# Patient Record
Sex: Male | Born: 1935 | Race: White | Hispanic: No | Marital: Married | Smoking: Never smoker
Health system: Southern US, Community
[De-identification: ages and names within clinical notes are randomized; demographics above are authoritative.]

## PROBLEM LIST (undated history)

## (undated) DIAGNOSIS — E538 Deficiency of other specified B group vitamins: Secondary | ICD-10-CM

## (undated) DIAGNOSIS — N189 Chronic kidney disease, unspecified: Secondary | ICD-10-CM

## (undated) DIAGNOSIS — C439 Malignant melanoma of skin, unspecified: Secondary | ICD-10-CM

## (undated) DIAGNOSIS — I4891 Unspecified atrial fibrillation: Secondary | ICD-10-CM

## (undated) DIAGNOSIS — I509 Heart failure, unspecified: Secondary | ICD-10-CM

## (undated) DIAGNOSIS — K859 Acute pancreatitis without necrosis or infection, unspecified: Secondary | ICD-10-CM

## (undated) HISTORY — DX: Malignant melanoma of skin, unspecified: C43.9

## (undated) HISTORY — DX: Chronic kidney disease, unspecified: N18.9

## (undated) HISTORY — DX: Deficiency of other specified B group vitamins: E53.8

---

## 1999-04-23 ENCOUNTER — Ambulatory Visit (HOSPITAL_COMMUNITY): Admission: RE | Admit: 1999-04-23 | Discharge: 1999-04-23 | Payer: Self-pay | Admitting: *Deleted

## 2000-07-30 ENCOUNTER — Ambulatory Visit (HOSPITAL_BASED_OUTPATIENT_CLINIC_OR_DEPARTMENT_OTHER): Admission: RE | Admit: 2000-07-30 | Discharge: 2000-07-30 | Payer: Self-pay | Admitting: Plastic Surgery

## 2006-02-10 ENCOUNTER — Encounter: Admission: RE | Admit: 2006-02-10 | Discharge: 2006-02-10 | Payer: Self-pay | Admitting: Geriatric Medicine

## 2011-10-25 DIAGNOSIS — C44529 Squamous cell carcinoma of skin of other part of trunk: Secondary | ICD-10-CM | POA: Diagnosis not present

## 2011-10-25 DIAGNOSIS — Z85828 Personal history of other malignant neoplasm of skin: Secondary | ICD-10-CM | POA: Diagnosis not present

## 2011-10-25 DIAGNOSIS — L821 Other seborrheic keratosis: Secondary | ICD-10-CM | POA: Diagnosis not present

## 2011-10-25 DIAGNOSIS — Z85820 Personal history of malignant melanoma of skin: Secondary | ICD-10-CM | POA: Diagnosis not present

## 2011-10-25 DIAGNOSIS — D0460 Carcinoma in situ of skin of unspecified upper limb, including shoulder: Secondary | ICD-10-CM | POA: Diagnosis not present

## 2011-10-25 DIAGNOSIS — L570 Actinic keratosis: Secondary | ICD-10-CM | POA: Diagnosis not present

## 2011-12-31 DIAGNOSIS — D3130 Benign neoplasm of unspecified choroid: Secondary | ICD-10-CM | POA: Diagnosis not present

## 2011-12-31 DIAGNOSIS — H40019 Open angle with borderline findings, low risk, unspecified eye: Secondary | ICD-10-CM | POA: Diagnosis not present

## 2011-12-31 DIAGNOSIS — H04129 Dry eye syndrome of unspecified lacrimal gland: Secondary | ICD-10-CM | POA: Diagnosis not present

## 2011-12-31 DIAGNOSIS — H2510 Age-related nuclear cataract, unspecified eye: Secondary | ICD-10-CM | POA: Diagnosis not present

## 2012-01-24 DIAGNOSIS — C44221 Squamous cell carcinoma of skin of unspecified ear and external auricular canal: Secondary | ICD-10-CM | POA: Diagnosis not present

## 2012-01-24 DIAGNOSIS — Z85828 Personal history of other malignant neoplasm of skin: Secondary | ICD-10-CM | POA: Diagnosis not present

## 2012-01-24 DIAGNOSIS — L570 Actinic keratosis: Secondary | ICD-10-CM | POA: Diagnosis not present

## 2012-01-24 DIAGNOSIS — Z85820 Personal history of malignant melanoma of skin: Secondary | ICD-10-CM | POA: Diagnosis not present

## 2012-01-24 DIAGNOSIS — C44519 Basal cell carcinoma of skin of other part of trunk: Secondary | ICD-10-CM | POA: Diagnosis not present

## 2012-01-24 DIAGNOSIS — C4441 Basal cell carcinoma of skin of scalp and neck: Secondary | ICD-10-CM | POA: Diagnosis not present

## 2012-01-24 DIAGNOSIS — L821 Other seborrheic keratosis: Secondary | ICD-10-CM | POA: Diagnosis not present

## 2012-06-09 DIAGNOSIS — C439 Malignant melanoma of skin, unspecified: Secondary | ICD-10-CM | POA: Diagnosis not present

## 2012-06-09 DIAGNOSIS — Z09 Encounter for follow-up examination after completed treatment for conditions other than malignant neoplasm: Secondary | ICD-10-CM | POA: Diagnosis not present

## 2012-06-09 DIAGNOSIS — Z006 Encounter for examination for normal comparison and control in clinical research program: Secondary | ICD-10-CM | POA: Diagnosis not present

## 2012-07-27 DIAGNOSIS — Z85828 Personal history of other malignant neoplasm of skin: Secondary | ICD-10-CM | POA: Diagnosis not present

## 2012-07-27 DIAGNOSIS — D485 Neoplasm of uncertain behavior of skin: Secondary | ICD-10-CM | POA: Diagnosis not present

## 2012-07-27 DIAGNOSIS — C44319 Basal cell carcinoma of skin of other parts of face: Secondary | ICD-10-CM | POA: Diagnosis not present

## 2012-07-27 DIAGNOSIS — C44711 Basal cell carcinoma of skin of unspecified lower limb, including hip: Secondary | ICD-10-CM | POA: Diagnosis not present

## 2012-07-27 DIAGNOSIS — L570 Actinic keratosis: Secondary | ICD-10-CM | POA: Diagnosis not present

## 2012-07-27 DIAGNOSIS — Z85820 Personal history of malignant melanoma of skin: Secondary | ICD-10-CM | POA: Diagnosis not present

## 2012-07-27 DIAGNOSIS — L821 Other seborrheic keratosis: Secondary | ICD-10-CM | POA: Diagnosis not present

## 2012-07-27 DIAGNOSIS — C44519 Basal cell carcinoma of skin of other part of trunk: Secondary | ICD-10-CM | POA: Diagnosis not present

## 2012-08-18 DIAGNOSIS — C44319 Basal cell carcinoma of skin of other parts of face: Secondary | ICD-10-CM | POA: Diagnosis not present

## 2012-10-26 DIAGNOSIS — Z85820 Personal history of malignant melanoma of skin: Secondary | ICD-10-CM | POA: Diagnosis not present

## 2012-10-26 DIAGNOSIS — L819 Disorder of pigmentation, unspecified: Secondary | ICD-10-CM | POA: Diagnosis not present

## 2012-10-26 DIAGNOSIS — L821 Other seborrheic keratosis: Secondary | ICD-10-CM | POA: Diagnosis not present

## 2012-10-26 DIAGNOSIS — L570 Actinic keratosis: Secondary | ICD-10-CM | POA: Diagnosis not present

## 2012-10-26 DIAGNOSIS — D1801 Hemangioma of skin and subcutaneous tissue: Secondary | ICD-10-CM | POA: Diagnosis not present

## 2012-10-26 DIAGNOSIS — D045 Carcinoma in situ of skin of trunk: Secondary | ICD-10-CM | POA: Diagnosis not present

## 2012-10-26 DIAGNOSIS — D239 Other benign neoplasm of skin, unspecified: Secondary | ICD-10-CM | POA: Diagnosis not present

## 2012-10-26 DIAGNOSIS — D485 Neoplasm of uncertain behavior of skin: Secondary | ICD-10-CM | POA: Diagnosis not present

## 2012-10-26 DIAGNOSIS — Z85828 Personal history of other malignant neoplasm of skin: Secondary | ICD-10-CM | POA: Diagnosis not present

## 2012-10-26 DIAGNOSIS — L738 Other specified follicular disorders: Secondary | ICD-10-CM | POA: Diagnosis not present

## 2012-12-15 DIAGNOSIS — C439 Malignant melanoma of skin, unspecified: Secondary | ICD-10-CM | POA: Diagnosis not present

## 2012-12-15 DIAGNOSIS — J984 Other disorders of lung: Secondary | ICD-10-CM | POA: Diagnosis not present

## 2012-12-15 DIAGNOSIS — M19019 Primary osteoarthritis, unspecified shoulder: Secondary | ICD-10-CM | POA: Diagnosis not present

## 2012-12-15 DIAGNOSIS — E278 Other specified disorders of adrenal gland: Secondary | ICD-10-CM | POA: Diagnosis not present

## 2012-12-15 DIAGNOSIS — Z006 Encounter for examination for normal comparison and control in clinical research program: Secondary | ICD-10-CM | POA: Diagnosis not present

## 2012-12-15 DIAGNOSIS — K429 Umbilical hernia without obstruction or gangrene: Secondary | ICD-10-CM | POA: Diagnosis not present

## 2012-12-15 DIAGNOSIS — Q619 Cystic kidney disease, unspecified: Secondary | ICD-10-CM | POA: Diagnosis not present

## 2012-12-15 DIAGNOSIS — E079 Disorder of thyroid, unspecified: Secondary | ICD-10-CM | POA: Diagnosis not present

## 2012-12-15 DIAGNOSIS — M479 Spondylosis, unspecified: Secondary | ICD-10-CM | POA: Diagnosis not present

## 2012-12-15 DIAGNOSIS — K5730 Diverticulosis of large intestine without perforation or abscess without bleeding: Secondary | ICD-10-CM | POA: Diagnosis not present

## 2012-12-15 DIAGNOSIS — N400 Enlarged prostate without lower urinary tract symptoms: Secondary | ICD-10-CM | POA: Diagnosis not present

## 2012-12-15 DIAGNOSIS — M799 Soft tissue disorder, unspecified: Secondary | ICD-10-CM | POA: Diagnosis not present

## 2012-12-15 DIAGNOSIS — K4020 Bilateral inguinal hernia, without obstruction or gangrene, not specified as recurrent: Secondary | ICD-10-CM | POA: Diagnosis not present

## 2012-12-15 DIAGNOSIS — Z0389 Encounter for observation for other suspected diseases and conditions ruled out: Secondary | ICD-10-CM | POA: Diagnosis not present

## 2013-01-05 DIAGNOSIS — H2510 Age-related nuclear cataract, unspecified eye: Secondary | ICD-10-CM | POA: Diagnosis not present

## 2013-01-05 DIAGNOSIS — D3130 Benign neoplasm of unspecified choroid: Secondary | ICD-10-CM | POA: Diagnosis not present

## 2013-01-05 DIAGNOSIS — H40019 Open angle with borderline findings, low risk, unspecified eye: Secondary | ICD-10-CM | POA: Diagnosis not present

## 2013-01-05 DIAGNOSIS — H0230 Blepharochalasis unspecified eye, unspecified eyelid: Secondary | ICD-10-CM | POA: Diagnosis not present

## 2013-01-22 DIAGNOSIS — Z85828 Personal history of other malignant neoplasm of skin: Secondary | ICD-10-CM | POA: Diagnosis not present

## 2013-01-22 DIAGNOSIS — L719 Rosacea, unspecified: Secondary | ICD-10-CM | POA: Diagnosis not present

## 2013-01-22 DIAGNOSIS — L570 Actinic keratosis: Secondary | ICD-10-CM | POA: Diagnosis not present

## 2013-01-22 DIAGNOSIS — L820 Inflamed seborrheic keratosis: Secondary | ICD-10-CM | POA: Diagnosis not present

## 2013-01-22 DIAGNOSIS — Z85820 Personal history of malignant melanoma of skin: Secondary | ICD-10-CM | POA: Diagnosis not present

## 2013-01-22 DIAGNOSIS — L819 Disorder of pigmentation, unspecified: Secondary | ICD-10-CM | POA: Diagnosis not present

## 2013-01-22 DIAGNOSIS — D1801 Hemangioma of skin and subcutaneous tissue: Secondary | ICD-10-CM | POA: Diagnosis not present

## 2013-01-22 DIAGNOSIS — L821 Other seborrheic keratosis: Secondary | ICD-10-CM | POA: Diagnosis not present

## 2013-04-27 DIAGNOSIS — L821 Other seborrheic keratosis: Secondary | ICD-10-CM | POA: Diagnosis not present

## 2013-04-27 DIAGNOSIS — L570 Actinic keratosis: Secondary | ICD-10-CM | POA: Diagnosis not present

## 2013-04-27 DIAGNOSIS — Z85820 Personal history of malignant melanoma of skin: Secondary | ICD-10-CM | POA: Diagnosis not present

## 2013-04-27 DIAGNOSIS — C44611 Basal cell carcinoma of skin of unspecified upper limb, including shoulder: Secondary | ICD-10-CM | POA: Diagnosis not present

## 2013-04-27 DIAGNOSIS — D485 Neoplasm of uncertain behavior of skin: Secondary | ICD-10-CM | POA: Diagnosis not present

## 2013-04-27 DIAGNOSIS — Z85828 Personal history of other malignant neoplasm of skin: Secondary | ICD-10-CM | POA: Diagnosis not present

## 2013-06-07 ENCOUNTER — Other Ambulatory Visit: Payer: Self-pay | Admitting: Geriatric Medicine

## 2013-06-07 DIAGNOSIS — R1310 Dysphagia, unspecified: Secondary | ICD-10-CM | POA: Diagnosis not present

## 2013-06-10 ENCOUNTER — Ambulatory Visit
Admission: RE | Admit: 2013-06-10 | Discharge: 2013-06-10 | Disposition: A | Discharge status: Home / Self Care | Payer: Medicare Other | Source: Ambulatory Visit | Attending: Geriatric Medicine | Admitting: Geriatric Medicine

## 2013-06-10 DIAGNOSIS — K449 Diaphragmatic hernia without obstruction or gangrene: Secondary | ICD-10-CM | POA: Diagnosis not present

## 2013-06-10 DIAGNOSIS — R1310 Dysphagia, unspecified: Secondary | ICD-10-CM

## 2013-06-10 DIAGNOSIS — K228 Other specified diseases of esophagus: Secondary | ICD-10-CM | POA: Diagnosis not present

## 2013-07-19 DIAGNOSIS — L0291 Cutaneous abscess, unspecified: Secondary | ICD-10-CM | POA: Diagnosis not present

## 2013-07-19 DIAGNOSIS — Z85828 Personal history of other malignant neoplasm of skin: Secondary | ICD-10-CM | POA: Diagnosis not present

## 2013-07-19 DIAGNOSIS — Z85820 Personal history of malignant melanoma of skin: Secondary | ICD-10-CM | POA: Diagnosis not present

## 2013-07-19 DIAGNOSIS — L723 Sebaceous cyst: Secondary | ICD-10-CM | POA: Diagnosis not present

## 2013-07-27 DIAGNOSIS — Z85820 Personal history of malignant melanoma of skin: Secondary | ICD-10-CM | POA: Diagnosis not present

## 2013-07-27 DIAGNOSIS — L821 Other seborrheic keratosis: Secondary | ICD-10-CM | POA: Diagnosis not present

## 2013-07-27 DIAGNOSIS — Z85828 Personal history of other malignant neoplasm of skin: Secondary | ICD-10-CM | POA: Diagnosis not present

## 2013-07-27 DIAGNOSIS — L570 Actinic keratosis: Secondary | ICD-10-CM | POA: Diagnosis not present

## 2013-07-27 DIAGNOSIS — D1801 Hemangioma of skin and subcutaneous tissue: Secondary | ICD-10-CM | POA: Diagnosis not present

## 2013-07-27 DIAGNOSIS — C44519 Basal cell carcinoma of skin of other part of trunk: Secondary | ICD-10-CM | POA: Diagnosis not present

## 2013-07-27 DIAGNOSIS — L723 Sebaceous cyst: Secondary | ICD-10-CM | POA: Diagnosis not present

## 2013-07-27 DIAGNOSIS — L819 Disorder of pigmentation, unspecified: Secondary | ICD-10-CM | POA: Diagnosis not present

## 2013-07-27 DIAGNOSIS — D485 Neoplasm of uncertain behavior of skin: Secondary | ICD-10-CM | POA: Diagnosis not present

## 2013-12-21 DIAGNOSIS — Z0389 Encounter for observation for other suspected diseases and conditions ruled out: Secondary | ICD-10-CM | POA: Diagnosis not present

## 2013-12-21 DIAGNOSIS — C439 Malignant melanoma of skin, unspecified: Secondary | ICD-10-CM | POA: Diagnosis not present

## 2013-12-29 DIAGNOSIS — C439 Malignant melanoma of skin, unspecified: Secondary | ICD-10-CM | POA: Diagnosis not present

## 2013-12-29 DIAGNOSIS — R599 Enlarged lymph nodes, unspecified: Secondary | ICD-10-CM | POA: Diagnosis not present

## 2013-12-29 DIAGNOSIS — R933 Abnormal findings on diagnostic imaging of other parts of digestive tract: Secondary | ICD-10-CM | POA: Diagnosis not present

## 2013-12-31 DIAGNOSIS — Z85828 Personal history of other malignant neoplasm of skin: Secondary | ICD-10-CM | POA: Diagnosis not present

## 2013-12-31 DIAGNOSIS — L570 Actinic keratosis: Secondary | ICD-10-CM | POA: Diagnosis not present

## 2013-12-31 DIAGNOSIS — D1801 Hemangioma of skin and subcutaneous tissue: Secondary | ICD-10-CM | POA: Diagnosis not present

## 2013-12-31 DIAGNOSIS — Z85820 Personal history of malignant melanoma of skin: Secondary | ICD-10-CM | POA: Diagnosis not present

## 2013-12-31 DIAGNOSIS — L821 Other seborrheic keratosis: Secondary | ICD-10-CM | POA: Diagnosis not present

## 2014-01-07 DIAGNOSIS — H04129 Dry eye syndrome of unspecified lacrimal gland: Secondary | ICD-10-CM | POA: Diagnosis not present

## 2014-01-07 DIAGNOSIS — H40019 Open angle with borderline findings, low risk, unspecified eye: Secondary | ICD-10-CM | POA: Diagnosis not present

## 2014-01-07 DIAGNOSIS — H02839 Dermatochalasis of unspecified eye, unspecified eyelid: Secondary | ICD-10-CM | POA: Diagnosis not present

## 2014-01-07 DIAGNOSIS — H2510 Age-related nuclear cataract, unspecified eye: Secondary | ICD-10-CM | POA: Diagnosis not present

## 2014-01-07 DIAGNOSIS — D3130 Benign neoplasm of unspecified choroid: Secondary | ICD-10-CM | POA: Diagnosis not present

## 2014-01-07 DIAGNOSIS — H1045 Other chronic allergic conjunctivitis: Secondary | ICD-10-CM | POA: Diagnosis not present

## 2014-01-10 DIAGNOSIS — C439 Malignant melanoma of skin, unspecified: Secondary | ICD-10-CM | POA: Diagnosis not present

## 2014-01-10 DIAGNOSIS — C786 Secondary malignant neoplasm of retroperitoneum and peritoneum: Secondary | ICD-10-CM | POA: Diagnosis not present

## 2014-01-10 DIAGNOSIS — C772 Secondary and unspecified malignant neoplasm of intra-abdominal lymph nodes: Secondary | ICD-10-CM | POA: Diagnosis not present

## 2014-01-10 DIAGNOSIS — C779 Secondary and unspecified malignant neoplasm of lymph node, unspecified: Secondary | ICD-10-CM | POA: Diagnosis not present

## 2014-01-10 DIAGNOSIS — Z0389 Encounter for observation for other suspected diseases and conditions ruled out: Secondary | ICD-10-CM | POA: Diagnosis not present

## 2014-01-10 DIAGNOSIS — C792 Secondary malignant neoplasm of skin: Secondary | ICD-10-CM | POA: Diagnosis not present

## 2014-01-10 DIAGNOSIS — C50919 Malignant neoplasm of unspecified site of unspecified female breast: Secondary | ICD-10-CM | POA: Diagnosis not present

## 2014-01-10 DIAGNOSIS — C801 Malignant (primary) neoplasm, unspecified: Secondary | ICD-10-CM | POA: Diagnosis not present

## 2014-01-12 DIAGNOSIS — C439 Malignant melanoma of skin, unspecified: Secondary | ICD-10-CM | POA: Diagnosis not present

## 2014-01-12 DIAGNOSIS — C4330 Malignant melanoma of unspecified part of face: Secondary | ICD-10-CM | POA: Diagnosis not present

## 2014-01-19 DIAGNOSIS — Z79899 Other long term (current) drug therapy: Secondary | ICD-10-CM | POA: Diagnosis not present

## 2014-01-19 DIAGNOSIS — C439 Malignant melanoma of skin, unspecified: Secondary | ICD-10-CM | POA: Diagnosis not present

## 2014-01-19 DIAGNOSIS — Z5111 Encounter for antineoplastic chemotherapy: Secondary | ICD-10-CM | POA: Diagnosis not present

## 2014-02-09 DIAGNOSIS — C439 Malignant melanoma of skin, unspecified: Secondary | ICD-10-CM | POA: Diagnosis not present

## 2014-02-09 DIAGNOSIS — C50919 Malignant neoplasm of unspecified site of unspecified female breast: Secondary | ICD-10-CM | POA: Diagnosis not present

## 2014-02-09 DIAGNOSIS — C786 Secondary malignant neoplasm of retroperitoneum and peritoneum: Secondary | ICD-10-CM | POA: Diagnosis not present

## 2014-02-09 DIAGNOSIS — C434 Malignant melanoma of scalp and neck: Secondary | ICD-10-CM | POA: Diagnosis not present

## 2014-02-09 DIAGNOSIS — Z5111 Encounter for antineoplastic chemotherapy: Secondary | ICD-10-CM | POA: Diagnosis not present

## 2014-02-09 DIAGNOSIS — C779 Secondary and unspecified malignant neoplasm of lymph node, unspecified: Secondary | ICD-10-CM | POA: Diagnosis not present

## 2014-03-02 DIAGNOSIS — C439 Malignant melanoma of skin, unspecified: Secondary | ICD-10-CM | POA: Diagnosis not present

## 2014-03-02 DIAGNOSIS — C772 Secondary and unspecified malignant neoplasm of intra-abdominal lymph nodes: Secondary | ICD-10-CM | POA: Diagnosis not present

## 2014-03-02 DIAGNOSIS — Z5111 Encounter for antineoplastic chemotherapy: Secondary | ICD-10-CM | POA: Diagnosis not present

## 2014-03-02 DIAGNOSIS — C770 Secondary and unspecified malignant neoplasm of lymph nodes of head, face and neck: Secondary | ICD-10-CM | POA: Diagnosis not present

## 2014-03-14 DIAGNOSIS — D0430 Carcinoma in situ of skin of unspecified part of face: Secondary | ICD-10-CM | POA: Diagnosis not present

## 2014-03-14 DIAGNOSIS — L819 Disorder of pigmentation, unspecified: Secondary | ICD-10-CM | POA: Diagnosis not present

## 2014-03-14 DIAGNOSIS — Z85828 Personal history of other malignant neoplasm of skin: Secondary | ICD-10-CM | POA: Diagnosis not present

## 2014-03-14 DIAGNOSIS — L570 Actinic keratosis: Secondary | ICD-10-CM | POA: Diagnosis not present

## 2014-03-14 DIAGNOSIS — D1801 Hemangioma of skin and subcutaneous tissue: Secondary | ICD-10-CM | POA: Diagnosis not present

## 2014-03-14 DIAGNOSIS — C44320 Squamous cell carcinoma of skin of unspecified parts of face: Secondary | ICD-10-CM | POA: Diagnosis not present

## 2014-03-14 DIAGNOSIS — L821 Other seborrheic keratosis: Secondary | ICD-10-CM | POA: Diagnosis not present

## 2014-03-14 DIAGNOSIS — Z85820 Personal history of malignant melanoma of skin: Secondary | ICD-10-CM | POA: Diagnosis not present

## 2014-03-14 DIAGNOSIS — D485 Neoplasm of uncertain behavior of skin: Secondary | ICD-10-CM | POA: Diagnosis not present

## 2014-03-14 DIAGNOSIS — D0439 Carcinoma in situ of skin of other parts of face: Secondary | ICD-10-CM | POA: Diagnosis not present

## 2014-03-23 DIAGNOSIS — C770 Secondary and unspecified malignant neoplasm of lymph nodes of head, face and neck: Secondary | ICD-10-CM | POA: Diagnosis not present

## 2014-03-23 DIAGNOSIS — Z006 Encounter for examination for normal comparison and control in clinical research program: Secondary | ICD-10-CM | POA: Diagnosis not present

## 2014-03-23 DIAGNOSIS — C786 Secondary malignant neoplasm of retroperitoneum and peritoneum: Secondary | ICD-10-CM | POA: Diagnosis not present

## 2014-03-23 DIAGNOSIS — C439 Malignant melanoma of skin, unspecified: Secondary | ICD-10-CM | POA: Diagnosis not present

## 2014-03-23 DIAGNOSIS — C434 Malignant melanoma of scalp and neck: Secondary | ICD-10-CM | POA: Diagnosis not present

## 2014-03-23 DIAGNOSIS — Z5111 Encounter for antineoplastic chemotherapy: Secondary | ICD-10-CM | POA: Diagnosis not present

## 2014-05-04 DIAGNOSIS — Z0389 Encounter for observation for other suspected diseases and conditions ruled out: Secondary | ICD-10-CM | POA: Diagnosis not present

## 2014-05-04 DIAGNOSIS — C770 Secondary and unspecified malignant neoplasm of lymph nodes of head, face and neck: Secondary | ICD-10-CM | POA: Diagnosis not present

## 2014-05-04 DIAGNOSIS — C772 Secondary and unspecified malignant neoplasm of intra-abdominal lymph nodes: Secondary | ICD-10-CM | POA: Diagnosis not present

## 2014-05-04 DIAGNOSIS — K5989 Other specified functional intestinal disorders: Secondary | ICD-10-CM | POA: Diagnosis not present

## 2014-05-04 DIAGNOSIS — J984 Other disorders of lung: Secondary | ICD-10-CM | POA: Diagnosis not present

## 2014-05-04 DIAGNOSIS — C439 Malignant melanoma of skin, unspecified: Secondary | ICD-10-CM | POA: Diagnosis not present

## 2014-05-04 DIAGNOSIS — C50919 Malignant neoplasm of unspecified site of unspecified female breast: Secondary | ICD-10-CM | POA: Diagnosis not present

## 2014-05-04 DIAGNOSIS — C434 Malignant melanoma of scalp and neck: Secondary | ICD-10-CM | POA: Diagnosis not present

## 2014-05-04 DIAGNOSIS — R599 Enlarged lymph nodes, unspecified: Secondary | ICD-10-CM | POA: Diagnosis not present

## 2014-05-04 DIAGNOSIS — C4330 Malignant melanoma of unspecified part of face: Secondary | ICD-10-CM | POA: Diagnosis not present

## 2014-05-04 DIAGNOSIS — C786 Secondary malignant neoplasm of retroperitoneum and peritoneum: Secondary | ICD-10-CM | POA: Diagnosis not present

## 2014-05-06 DIAGNOSIS — J159 Unspecified bacterial pneumonia: Secondary | ICD-10-CM | POA: Diagnosis not present

## 2014-05-06 DIAGNOSIS — Z1331 Encounter for screening for depression: Secondary | ICD-10-CM | POA: Diagnosis not present

## 2014-05-06 DIAGNOSIS — H6990 Unspecified Eustachian tube disorder, unspecified ear: Secondary | ICD-10-CM | POA: Diagnosis not present

## 2014-06-01 DIAGNOSIS — C7889 Secondary malignant neoplasm of other digestive organs: Secondary | ICD-10-CM | POA: Diagnosis not present

## 2014-06-01 DIAGNOSIS — C4330 Malignant melanoma of unspecified part of face: Secondary | ICD-10-CM | POA: Diagnosis not present

## 2014-06-01 DIAGNOSIS — R918 Other nonspecific abnormal finding of lung field: Secondary | ICD-10-CM | POA: Diagnosis not present

## 2014-06-01 DIAGNOSIS — Z0389 Encounter for observation for other suspected diseases and conditions ruled out: Secondary | ICD-10-CM | POA: Diagnosis not present

## 2014-06-01 DIAGNOSIS — C439 Malignant melanoma of skin, unspecified: Secondary | ICD-10-CM | POA: Diagnosis not present

## 2014-06-01 DIAGNOSIS — Z85820 Personal history of malignant melanoma of skin: Secondary | ICD-10-CM | POA: Diagnosis not present

## 2014-06-27 DIAGNOSIS — Z85820 Personal history of malignant melanoma of skin: Secondary | ICD-10-CM | POA: Diagnosis not present

## 2014-06-27 DIAGNOSIS — L819 Disorder of pigmentation, unspecified: Secondary | ICD-10-CM | POA: Diagnosis not present

## 2014-06-27 DIAGNOSIS — C44611 Basal cell carcinoma of skin of unspecified upper limb, including shoulder: Secondary | ICD-10-CM | POA: Diagnosis not present

## 2014-06-27 DIAGNOSIS — C4441 Basal cell carcinoma of skin of scalp and neck: Secondary | ICD-10-CM | POA: Diagnosis not present

## 2014-06-27 DIAGNOSIS — Z85828 Personal history of other malignant neoplasm of skin: Secondary | ICD-10-CM | POA: Diagnosis not present

## 2014-06-27 DIAGNOSIS — D485 Neoplasm of uncertain behavior of skin: Secondary | ICD-10-CM | POA: Diagnosis not present

## 2014-06-27 DIAGNOSIS — D1801 Hemangioma of skin and subcutaneous tissue: Secondary | ICD-10-CM | POA: Diagnosis not present

## 2014-06-27 DIAGNOSIS — L570 Actinic keratosis: Secondary | ICD-10-CM | POA: Diagnosis not present

## 2014-06-27 DIAGNOSIS — L821 Other seborrheic keratosis: Secondary | ICD-10-CM | POA: Diagnosis not present

## 2014-06-27 DIAGNOSIS — D044 Carcinoma in situ of skin of scalp and neck: Secondary | ICD-10-CM | POA: Diagnosis not present

## 2014-07-06 DIAGNOSIS — C50919 Malignant neoplasm of unspecified site of unspecified female breast: Secondary | ICD-10-CM | POA: Diagnosis not present

## 2014-07-06 DIAGNOSIS — C434 Malignant melanoma of scalp and neck: Secondary | ICD-10-CM | POA: Diagnosis not present

## 2014-07-06 DIAGNOSIS — Z85828 Personal history of other malignant neoplasm of skin: Secondary | ICD-10-CM | POA: Diagnosis not present

## 2014-07-06 DIAGNOSIS — C786 Secondary malignant neoplasm of retroperitoneum and peritoneum: Secondary | ICD-10-CM | POA: Diagnosis not present

## 2014-07-06 DIAGNOSIS — C7889 Secondary malignant neoplasm of other digestive organs: Secondary | ICD-10-CM | POA: Diagnosis not present

## 2014-07-06 DIAGNOSIS — R599 Enlarged lymph nodes, unspecified: Secondary | ICD-10-CM | POA: Diagnosis not present

## 2014-07-06 DIAGNOSIS — R918 Other nonspecific abnormal finding of lung field: Secondary | ICD-10-CM | POA: Diagnosis not present

## 2014-07-06 DIAGNOSIS — C439 Malignant melanoma of skin, unspecified: Secondary | ICD-10-CM | POA: Diagnosis not present

## 2014-08-01 DIAGNOSIS — J40 Bronchitis, not specified as acute or chronic: Secondary | ICD-10-CM | POA: Diagnosis not present

## 2014-08-10 DIAGNOSIS — Z85820 Personal history of malignant melanoma of skin: Secondary | ICD-10-CM | POA: Diagnosis not present

## 2014-08-10 DIAGNOSIS — Z85828 Personal history of other malignant neoplasm of skin: Secondary | ICD-10-CM | POA: Diagnosis not present

## 2014-08-10 DIAGNOSIS — C4441 Basal cell carcinoma of skin of scalp and neck: Secondary | ICD-10-CM | POA: Diagnosis not present

## 2014-08-31 DIAGNOSIS — C434 Malignant melanoma of scalp and neck: Secondary | ICD-10-CM | POA: Diagnosis not present

## 2014-08-31 DIAGNOSIS — C779 Secondary and unspecified malignant neoplasm of lymph node, unspecified: Secondary | ICD-10-CM | POA: Diagnosis not present

## 2014-08-31 DIAGNOSIS — C799 Secondary malignant neoplasm of unspecified site: Secondary | ICD-10-CM | POA: Diagnosis not present

## 2014-10-21 DIAGNOSIS — L821 Other seborrheic keratosis: Secondary | ICD-10-CM | POA: Diagnosis not present

## 2014-10-21 DIAGNOSIS — Z85820 Personal history of malignant melanoma of skin: Secondary | ICD-10-CM | POA: Diagnosis not present

## 2014-10-21 DIAGNOSIS — L570 Actinic keratosis: Secondary | ICD-10-CM | POA: Diagnosis not present

## 2014-10-21 DIAGNOSIS — L812 Freckles: Secondary | ICD-10-CM | POA: Diagnosis not present

## 2014-10-21 DIAGNOSIS — D1801 Hemangioma of skin and subcutaneous tissue: Secondary | ICD-10-CM | POA: Diagnosis not present

## 2014-10-21 DIAGNOSIS — Z85828 Personal history of other malignant neoplasm of skin: Secondary | ICD-10-CM | POA: Diagnosis not present

## 2014-11-02 DIAGNOSIS — C799 Secondary malignant neoplasm of unspecified site: Secondary | ICD-10-CM | POA: Diagnosis not present

## 2014-11-02 DIAGNOSIS — C7889 Secondary malignant neoplasm of other digestive organs: Secondary | ICD-10-CM | POA: Diagnosis not present

## 2014-11-02 DIAGNOSIS — C439 Malignant melanoma of skin, unspecified: Secondary | ICD-10-CM | POA: Diagnosis not present

## 2014-11-02 DIAGNOSIS — C434 Malignant melanoma of scalp and neck: Secondary | ICD-10-CM | POA: Diagnosis not present

## 2014-11-02 DIAGNOSIS — C762 Malignant neoplasm of abdomen: Secondary | ICD-10-CM | POA: Diagnosis not present

## 2014-11-02 DIAGNOSIS — C779 Secondary and unspecified malignant neoplasm of lymph node, unspecified: Secondary | ICD-10-CM | POA: Diagnosis not present

## 2014-12-15 DIAGNOSIS — C439 Malignant melanoma of skin, unspecified: Secondary | ICD-10-CM | POA: Diagnosis not present

## 2014-12-15 DIAGNOSIS — Z1389 Encounter for screening for other disorder: Secondary | ICD-10-CM | POA: Diagnosis not present

## 2014-12-15 DIAGNOSIS — Z0000 Encounter for general adult medical examination without abnormal findings: Secondary | ICD-10-CM | POA: Diagnosis not present

## 2014-12-15 DIAGNOSIS — M542 Cervicalgia: Secondary | ICD-10-CM | POA: Diagnosis not present

## 2015-01-20 DIAGNOSIS — L821 Other seborrheic keratosis: Secondary | ICD-10-CM | POA: Diagnosis not present

## 2015-01-20 DIAGNOSIS — C44612 Basal cell carcinoma of skin of right upper limb, including shoulder: Secondary | ICD-10-CM | POA: Diagnosis not present

## 2015-01-20 DIAGNOSIS — Z85820 Personal history of malignant melanoma of skin: Secondary | ICD-10-CM | POA: Diagnosis not present

## 2015-01-20 DIAGNOSIS — Z85828 Personal history of other malignant neoplasm of skin: Secondary | ICD-10-CM | POA: Diagnosis not present

## 2015-01-20 DIAGNOSIS — D485 Neoplasm of uncertain behavior of skin: Secondary | ICD-10-CM | POA: Diagnosis not present

## 2015-01-20 DIAGNOSIS — D1801 Hemangioma of skin and subcutaneous tissue: Secondary | ICD-10-CM | POA: Diagnosis not present

## 2015-01-20 DIAGNOSIS — L570 Actinic keratosis: Secondary | ICD-10-CM | POA: Diagnosis not present

## 2015-01-20 DIAGNOSIS — L812 Freckles: Secondary | ICD-10-CM | POA: Diagnosis not present

## 2015-02-01 DIAGNOSIS — C799 Secondary malignant neoplasm of unspecified site: Secondary | ICD-10-CM | POA: Diagnosis not present

## 2015-02-01 DIAGNOSIS — C7889 Secondary malignant neoplasm of other digestive organs: Secondary | ICD-10-CM | POA: Diagnosis not present

## 2015-02-01 DIAGNOSIS — C4339 Malignant melanoma of other parts of face: Secondary | ICD-10-CM | POA: Diagnosis not present

## 2015-02-01 DIAGNOSIS — C762 Malignant neoplasm of abdomen: Secondary | ICD-10-CM | POA: Diagnosis not present

## 2015-02-01 DIAGNOSIS — C434 Malignant melanoma of scalp and neck: Secondary | ICD-10-CM | POA: Diagnosis not present

## 2015-02-02 DIAGNOSIS — H02831 Dermatochalasis of right upper eyelid: Secondary | ICD-10-CM | POA: Diagnosis not present

## 2015-02-02 DIAGNOSIS — H04123 Dry eye syndrome of bilateral lacrimal glands: Secondary | ICD-10-CM | POA: Diagnosis not present

## 2015-02-02 DIAGNOSIS — H43393 Other vitreous opacities, bilateral: Secondary | ICD-10-CM | POA: Diagnosis not present

## 2015-02-02 DIAGNOSIS — H40013 Open angle with borderline findings, low risk, bilateral: Secondary | ICD-10-CM | POA: Diagnosis not present

## 2015-02-02 DIAGNOSIS — H2513 Age-related nuclear cataract, bilateral: Secondary | ICD-10-CM | POA: Diagnosis not present

## 2015-02-02 DIAGNOSIS — H10413 Chronic giant papillary conjunctivitis, bilateral: Secondary | ICD-10-CM | POA: Diagnosis not present

## 2015-02-02 DIAGNOSIS — D3131 Benign neoplasm of right choroid: Secondary | ICD-10-CM | POA: Diagnosis not present

## 2015-02-02 DIAGNOSIS — H02834 Dermatochalasis of left upper eyelid: Secondary | ICD-10-CM | POA: Diagnosis not present

## 2015-04-22 DIAGNOSIS — Z91048 Other nonmedicinal substance allergy status: Secondary | ICD-10-CM | POA: Diagnosis not present

## 2015-04-22 DIAGNOSIS — Z85820 Personal history of malignant melanoma of skin: Secondary | ICD-10-CM | POA: Diagnosis not present

## 2015-04-22 DIAGNOSIS — I4510 Unspecified right bundle-branch block: Secondary | ICD-10-CM | POA: Diagnosis not present

## 2015-04-22 DIAGNOSIS — C7889 Secondary malignant neoplasm of other digestive organs: Secondary | ICD-10-CM | POA: Diagnosis not present

## 2015-04-22 DIAGNOSIS — K868 Other specified diseases of pancreas: Secondary | ICD-10-CM | POA: Diagnosis not present

## 2015-04-22 DIAGNOSIS — R748 Abnormal levels of other serum enzymes: Secondary | ICD-10-CM | POA: Diagnosis not present

## 2015-04-22 DIAGNOSIS — R1012 Left upper quadrant pain: Secondary | ICD-10-CM | POA: Diagnosis not present

## 2015-04-22 DIAGNOSIS — K858 Other acute pancreatitis: Secondary | ICD-10-CM | POA: Diagnosis not present

## 2015-04-23 DIAGNOSIS — K858 Other acute pancreatitis: Secondary | ICD-10-CM | POA: Diagnosis not present

## 2015-04-24 DIAGNOSIS — I361 Nonrheumatic tricuspid (valve) insufficiency: Secondary | ICD-10-CM | POA: Diagnosis not present

## 2015-04-24 DIAGNOSIS — I358 Other nonrheumatic aortic valve disorders: Secondary | ICD-10-CM | POA: Diagnosis not present

## 2015-04-24 DIAGNOSIS — K858 Other acute pancreatitis: Secondary | ICD-10-CM | POA: Diagnosis not present

## 2015-04-24 DIAGNOSIS — I517 Cardiomegaly: Secondary | ICD-10-CM | POA: Diagnosis not present

## 2015-04-24 DIAGNOSIS — I371 Nonrheumatic pulmonary valve insufficiency: Secondary | ICD-10-CM | POA: Diagnosis not present

## 2015-05-03 DIAGNOSIS — C799 Secondary malignant neoplasm of unspecified site: Secondary | ICD-10-CM | POA: Diagnosis not present

## 2015-05-03 DIAGNOSIS — K858 Other acute pancreatitis: Secondary | ICD-10-CM | POA: Diagnosis not present

## 2015-05-03 DIAGNOSIS — C439 Malignant melanoma of skin, unspecified: Secondary | ICD-10-CM | POA: Diagnosis not present

## 2015-05-03 DIAGNOSIS — C434 Malignant melanoma of scalp and neck: Secondary | ICD-10-CM | POA: Diagnosis not present

## 2015-05-03 DIAGNOSIS — E663 Overweight: Secondary | ICD-10-CM | POA: Diagnosis not present

## 2015-05-03 DIAGNOSIS — C7889 Secondary malignant neoplasm of other digestive organs: Secondary | ICD-10-CM | POA: Diagnosis not present

## 2015-05-03 DIAGNOSIS — C7989 Secondary malignant neoplasm of other specified sites: Secondary | ICD-10-CM | POA: Diagnosis not present

## 2015-05-24 DIAGNOSIS — K861 Other chronic pancreatitis: Secondary | ICD-10-CM | POA: Diagnosis not present

## 2015-05-24 DIAGNOSIS — K869 Disease of pancreas, unspecified: Secondary | ICD-10-CM | POA: Diagnosis not present

## 2015-05-24 DIAGNOSIS — C7889 Secondary malignant neoplasm of other digestive organs: Secondary | ICD-10-CM | POA: Diagnosis not present

## 2015-05-24 DIAGNOSIS — C434 Malignant melanoma of scalp and neck: Secondary | ICD-10-CM | POA: Diagnosis not present

## 2015-05-24 DIAGNOSIS — C7989 Secondary malignant neoplasm of other specified sites: Secondary | ICD-10-CM | POA: Diagnosis not present

## 2015-05-24 DIAGNOSIS — C799 Secondary malignant neoplasm of unspecified site: Secondary | ICD-10-CM | POA: Diagnosis not present

## 2015-06-14 DIAGNOSIS — C799 Secondary malignant neoplasm of unspecified site: Secondary | ICD-10-CM | POA: Diagnosis not present

## 2015-06-14 DIAGNOSIS — K861 Other chronic pancreatitis: Secondary | ICD-10-CM | POA: Diagnosis not present

## 2015-06-14 DIAGNOSIS — Z8589 Personal history of malignant neoplasm of other organs and systems: Secondary | ICD-10-CM | POA: Diagnosis not present

## 2015-06-14 DIAGNOSIS — Z9889 Other specified postprocedural states: Secondary | ICD-10-CM | POA: Diagnosis not present

## 2015-06-14 DIAGNOSIS — Z809 Family history of malignant neoplasm, unspecified: Secondary | ICD-10-CM | POA: Diagnosis not present

## 2015-06-14 DIAGNOSIS — C439 Malignant melanoma of skin, unspecified: Secondary | ICD-10-CM | POA: Diagnosis not present

## 2015-06-14 DIAGNOSIS — Z85820 Personal history of malignant melanoma of skin: Secondary | ICD-10-CM | POA: Diagnosis not present

## 2015-06-30 DIAGNOSIS — L821 Other seborrheic keratosis: Secondary | ICD-10-CM | POA: Diagnosis not present

## 2015-06-30 DIAGNOSIS — L570 Actinic keratosis: Secondary | ICD-10-CM | POA: Diagnosis not present

## 2015-06-30 DIAGNOSIS — D1801 Hemangioma of skin and subcutaneous tissue: Secondary | ICD-10-CM | POA: Diagnosis not present

## 2015-06-30 DIAGNOSIS — Z85828 Personal history of other malignant neoplasm of skin: Secondary | ICD-10-CM | POA: Diagnosis not present

## 2015-06-30 DIAGNOSIS — Z85820 Personal history of malignant melanoma of skin: Secondary | ICD-10-CM | POA: Diagnosis not present

## 2015-07-05 DIAGNOSIS — C799 Secondary malignant neoplasm of unspecified site: Secondary | ICD-10-CM | POA: Diagnosis not present

## 2015-07-05 DIAGNOSIS — C7889 Secondary malignant neoplasm of other digestive organs: Secondary | ICD-10-CM | POA: Diagnosis not present

## 2015-07-05 DIAGNOSIS — C779 Secondary and unspecified malignant neoplasm of lymph node, unspecified: Secondary | ICD-10-CM | POA: Diagnosis not present

## 2015-07-05 DIAGNOSIS — C7989 Secondary malignant neoplasm of other specified sites: Secondary | ICD-10-CM | POA: Diagnosis not present

## 2015-07-05 DIAGNOSIS — Z5111 Encounter for antineoplastic chemotherapy: Secondary | ICD-10-CM | POA: Diagnosis not present

## 2015-07-05 DIAGNOSIS — C439 Malignant melanoma of skin, unspecified: Secondary | ICD-10-CM | POA: Diagnosis not present

## 2015-07-05 DIAGNOSIS — C434 Malignant melanoma of scalp and neck: Secondary | ICD-10-CM | POA: Diagnosis not present

## 2015-07-26 DIAGNOSIS — Z79899 Other long term (current) drug therapy: Secondary | ICD-10-CM | POA: Diagnosis not present

## 2015-07-26 DIAGNOSIS — Z85820 Personal history of malignant melanoma of skin: Secondary | ICD-10-CM | POA: Diagnosis not present

## 2015-07-26 DIAGNOSIS — Z5111 Encounter for antineoplastic chemotherapy: Secondary | ICD-10-CM | POA: Diagnosis not present

## 2015-07-26 DIAGNOSIS — C779 Secondary and unspecified malignant neoplasm of lymph node, unspecified: Secondary | ICD-10-CM | POA: Diagnosis not present

## 2015-07-26 DIAGNOSIS — C434 Malignant melanoma of scalp and neck: Secondary | ICD-10-CM | POA: Diagnosis not present

## 2015-08-16 DIAGNOSIS — C7889 Secondary malignant neoplasm of other digestive organs: Secondary | ICD-10-CM | POA: Diagnosis not present

## 2015-08-16 DIAGNOSIS — C779 Secondary and unspecified malignant neoplasm of lymph node, unspecified: Secondary | ICD-10-CM | POA: Diagnosis not present

## 2015-08-16 DIAGNOSIS — Z8589 Personal history of malignant neoplasm of other organs and systems: Secondary | ICD-10-CM | POA: Diagnosis not present

## 2015-08-16 DIAGNOSIS — C439 Malignant melanoma of skin, unspecified: Secondary | ICD-10-CM | POA: Diagnosis not present

## 2015-08-16 DIAGNOSIS — Z8579 Personal history of other malignant neoplasms of lymphoid, hematopoietic and related tissues: Secondary | ICD-10-CM | POA: Diagnosis not present

## 2015-08-16 DIAGNOSIS — Z79899 Other long term (current) drug therapy: Secondary | ICD-10-CM | POA: Diagnosis not present

## 2015-09-13 DIAGNOSIS — C779 Secondary and unspecified malignant neoplasm of lymph node, unspecified: Secondary | ICD-10-CM | POA: Diagnosis not present

## 2015-09-13 DIAGNOSIS — Z5111 Encounter for antineoplastic chemotherapy: Secondary | ICD-10-CM | POA: Diagnosis not present

## 2015-09-13 DIAGNOSIS — C799 Secondary malignant neoplasm of unspecified site: Secondary | ICD-10-CM | POA: Diagnosis not present

## 2015-09-13 DIAGNOSIS — C439 Malignant melanoma of skin, unspecified: Secondary | ICD-10-CM | POA: Diagnosis not present

## 2015-09-13 DIAGNOSIS — C434 Malignant melanoma of scalp and neck: Secondary | ICD-10-CM | POA: Diagnosis not present

## 2015-09-13 DIAGNOSIS — K869 Disease of pancreas, unspecified: Secondary | ICD-10-CM | POA: Diagnosis not present

## 2015-09-26 DIAGNOSIS — D485 Neoplasm of uncertain behavior of skin: Secondary | ICD-10-CM | POA: Diagnosis not present

## 2015-09-26 DIAGNOSIS — Z85828 Personal history of other malignant neoplasm of skin: Secondary | ICD-10-CM | POA: Diagnosis not present

## 2015-09-26 DIAGNOSIS — D2262 Melanocytic nevi of left upper limb, including shoulder: Secondary | ICD-10-CM | POA: Diagnosis not present

## 2015-09-26 DIAGNOSIS — L821 Other seborrheic keratosis: Secondary | ICD-10-CM | POA: Diagnosis not present

## 2015-09-26 DIAGNOSIS — Z85820 Personal history of malignant melanoma of skin: Secondary | ICD-10-CM | POA: Diagnosis not present

## 2015-09-26 DIAGNOSIS — L570 Actinic keratosis: Secondary | ICD-10-CM | POA: Diagnosis not present

## 2015-09-26 DIAGNOSIS — D1801 Hemangioma of skin and subcutaneous tissue: Secondary | ICD-10-CM | POA: Diagnosis not present

## 2015-10-04 DIAGNOSIS — C434 Malignant melanoma of scalp and neck: Secondary | ICD-10-CM | POA: Diagnosis not present

## 2015-10-04 DIAGNOSIS — C799 Secondary malignant neoplasm of unspecified site: Secondary | ICD-10-CM | POA: Diagnosis not present

## 2015-10-04 DIAGNOSIS — C770 Secondary and unspecified malignant neoplasm of lymph nodes of head, face and neck: Secondary | ICD-10-CM | POA: Diagnosis not present

## 2015-10-04 DIAGNOSIS — Z5112 Encounter for antineoplastic immunotherapy: Secondary | ICD-10-CM | POA: Diagnosis not present

## 2015-10-04 DIAGNOSIS — C779 Secondary and unspecified malignant neoplasm of lymph node, unspecified: Secondary | ICD-10-CM | POA: Diagnosis not present

## 2015-10-18 DIAGNOSIS — M25552 Pain in left hip: Secondary | ICD-10-CM | POA: Diagnosis not present

## 2015-10-25 DIAGNOSIS — C799 Secondary malignant neoplasm of unspecified site: Secondary | ICD-10-CM | POA: Diagnosis not present

## 2015-10-25 DIAGNOSIS — C762 Malignant neoplasm of abdomen: Secondary | ICD-10-CM | POA: Diagnosis not present

## 2015-10-25 DIAGNOSIS — C779 Secondary and unspecified malignant neoplasm of lymph node, unspecified: Secondary | ICD-10-CM | POA: Diagnosis not present

## 2015-10-25 DIAGNOSIS — C434 Malignant melanoma of scalp and neck: Secondary | ICD-10-CM | POA: Diagnosis not present

## 2015-11-15 DIAGNOSIS — C4339 Malignant melanoma of other parts of face: Secondary | ICD-10-CM | POA: Diagnosis not present

## 2015-11-15 DIAGNOSIS — C434 Malignant melanoma of scalp and neck: Secondary | ICD-10-CM | POA: Diagnosis not present

## 2015-11-15 DIAGNOSIS — C7989 Secondary malignant neoplasm of other specified sites: Secondary | ICD-10-CM | POA: Diagnosis not present

## 2015-11-15 DIAGNOSIS — Z5111 Encounter for antineoplastic chemotherapy: Secondary | ICD-10-CM | POA: Diagnosis not present

## 2015-11-15 DIAGNOSIS — C799 Secondary malignant neoplasm of unspecified site: Secondary | ICD-10-CM | POA: Diagnosis not present

## 2015-11-15 DIAGNOSIS — C779 Secondary and unspecified malignant neoplasm of lymph node, unspecified: Secondary | ICD-10-CM | POA: Diagnosis not present

## 2015-11-15 DIAGNOSIS — C7889 Secondary malignant neoplasm of other digestive organs: Secondary | ICD-10-CM | POA: Diagnosis not present

## 2015-11-15 DIAGNOSIS — C762 Malignant neoplasm of abdomen: Secondary | ICD-10-CM | POA: Diagnosis not present

## 2015-12-06 DIAGNOSIS — C792 Secondary malignant neoplasm of skin: Secondary | ICD-10-CM | POA: Diagnosis not present

## 2015-12-06 DIAGNOSIS — C7989 Secondary malignant neoplasm of other specified sites: Secondary | ICD-10-CM | POA: Diagnosis not present

## 2015-12-06 DIAGNOSIS — C762 Malignant neoplasm of abdomen: Secondary | ICD-10-CM | POA: Diagnosis not present

## 2015-12-06 DIAGNOSIS — C799 Secondary malignant neoplasm of unspecified site: Secondary | ICD-10-CM | POA: Diagnosis not present

## 2015-12-06 DIAGNOSIS — C439 Malignant melanoma of skin, unspecified: Secondary | ICD-10-CM | POA: Diagnosis not present

## 2015-12-06 DIAGNOSIS — Z5111 Encounter for antineoplastic chemotherapy: Secondary | ICD-10-CM | POA: Diagnosis not present

## 2015-12-25 DIAGNOSIS — Z0000 Encounter for general adult medical examination without abnormal findings: Secondary | ICD-10-CM | POA: Diagnosis not present

## 2015-12-25 DIAGNOSIS — Z1389 Encounter for screening for other disorder: Secondary | ICD-10-CM | POA: Diagnosis not present

## 2015-12-25 DIAGNOSIS — C799 Secondary malignant neoplasm of unspecified site: Secondary | ICD-10-CM | POA: Diagnosis not present

## 2015-12-27 DIAGNOSIS — C434 Malignant melanoma of scalp and neck: Secondary | ICD-10-CM | POA: Diagnosis not present

## 2015-12-27 DIAGNOSIS — C762 Malignant neoplasm of abdomen: Secondary | ICD-10-CM | POA: Diagnosis not present

## 2015-12-27 DIAGNOSIS — C439 Malignant melanoma of skin, unspecified: Secondary | ICD-10-CM | POA: Diagnosis not present

## 2015-12-27 DIAGNOSIS — Z5112 Encounter for antineoplastic immunotherapy: Secondary | ICD-10-CM | POA: Diagnosis not present

## 2015-12-27 DIAGNOSIS — C799 Secondary malignant neoplasm of unspecified site: Secondary | ICD-10-CM | POA: Diagnosis not present

## 2015-12-27 DIAGNOSIS — C772 Secondary and unspecified malignant neoplasm of intra-abdominal lymph nodes: Secondary | ICD-10-CM | POA: Diagnosis not present

## 2015-12-27 DIAGNOSIS — C770 Secondary and unspecified malignant neoplasm of lymph nodes of head, face and neck: Secondary | ICD-10-CM | POA: Diagnosis not present

## 2016-01-12 DIAGNOSIS — L812 Freckles: Secondary | ICD-10-CM | POA: Diagnosis not present

## 2016-01-12 DIAGNOSIS — D044 Carcinoma in situ of skin of scalp and neck: Secondary | ICD-10-CM | POA: Diagnosis not present

## 2016-01-12 DIAGNOSIS — D1801 Hemangioma of skin and subcutaneous tissue: Secondary | ICD-10-CM | POA: Diagnosis not present

## 2016-01-12 DIAGNOSIS — D485 Neoplasm of uncertain behavior of skin: Secondary | ICD-10-CM | POA: Diagnosis not present

## 2016-01-12 DIAGNOSIS — Z85828 Personal history of other malignant neoplasm of skin: Secondary | ICD-10-CM | POA: Diagnosis not present

## 2016-01-12 DIAGNOSIS — L820 Inflamed seborrheic keratosis: Secondary | ICD-10-CM | POA: Diagnosis not present

## 2016-01-12 DIAGNOSIS — L570 Actinic keratosis: Secondary | ICD-10-CM | POA: Diagnosis not present

## 2016-01-12 DIAGNOSIS — Z85820 Personal history of malignant melanoma of skin: Secondary | ICD-10-CM | POA: Diagnosis not present

## 2016-01-12 DIAGNOSIS — L821 Other seborrheic keratosis: Secondary | ICD-10-CM | POA: Diagnosis not present

## 2016-01-17 DIAGNOSIS — C799 Secondary malignant neoplasm of unspecified site: Secondary | ICD-10-CM | POA: Diagnosis not present

## 2016-01-17 DIAGNOSIS — C434 Malignant melanoma of scalp and neck: Secondary | ICD-10-CM | POA: Diagnosis not present

## 2016-01-17 DIAGNOSIS — Z5111 Encounter for antineoplastic chemotherapy: Secondary | ICD-10-CM | POA: Diagnosis not present

## 2016-01-17 DIAGNOSIS — C7989 Secondary malignant neoplasm of other specified sites: Secondary | ICD-10-CM | POA: Diagnosis not present

## 2016-01-17 DIAGNOSIS — C779 Secondary and unspecified malignant neoplasm of lymph node, unspecified: Secondary | ICD-10-CM | POA: Diagnosis not present

## 2016-01-17 DIAGNOSIS — C762 Malignant neoplasm of abdomen: Secondary | ICD-10-CM | POA: Diagnosis not present

## 2016-02-13 DIAGNOSIS — D3131 Benign neoplasm of right choroid: Secondary | ICD-10-CM | POA: Diagnosis not present

## 2016-02-13 DIAGNOSIS — H04123 Dry eye syndrome of bilateral lacrimal glands: Secondary | ICD-10-CM | POA: Diagnosis not present

## 2016-02-13 DIAGNOSIS — H40013 Open angle with borderline findings, low risk, bilateral: Secondary | ICD-10-CM | POA: Diagnosis not present

## 2016-02-13 DIAGNOSIS — H02831 Dermatochalasis of right upper eyelid: Secondary | ICD-10-CM | POA: Diagnosis not present

## 2016-02-13 DIAGNOSIS — H532 Diplopia: Secondary | ICD-10-CM | POA: Diagnosis not present

## 2016-02-13 DIAGNOSIS — H10413 Chronic giant papillary conjunctivitis, bilateral: Secondary | ICD-10-CM | POA: Diagnosis not present

## 2016-02-13 DIAGNOSIS — H02834 Dermatochalasis of left upper eyelid: Secondary | ICD-10-CM | POA: Diagnosis not present

## 2016-02-13 DIAGNOSIS — H2513 Age-related nuclear cataract, bilateral: Secondary | ICD-10-CM | POA: Diagnosis not present

## 2016-02-14 DIAGNOSIS — C799 Secondary malignant neoplasm of unspecified site: Secondary | ICD-10-CM | POA: Diagnosis not present

## 2016-02-14 DIAGNOSIS — C7889 Secondary malignant neoplasm of other digestive organs: Secondary | ICD-10-CM | POA: Diagnosis not present

## 2016-02-14 DIAGNOSIS — C779 Secondary and unspecified malignant neoplasm of lymph node, unspecified: Secondary | ICD-10-CM | POA: Diagnosis not present

## 2016-02-14 DIAGNOSIS — Z5111 Encounter for antineoplastic chemotherapy: Secondary | ICD-10-CM | POA: Diagnosis not present

## 2016-02-14 DIAGNOSIS — C434 Malignant melanoma of scalp and neck: Secondary | ICD-10-CM | POA: Diagnosis not present

## 2016-02-14 DIAGNOSIS — Z9889 Other specified postprocedural states: Secondary | ICD-10-CM | POA: Diagnosis not present

## 2016-02-14 DIAGNOSIS — C786 Secondary malignant neoplasm of retroperitoneum and peritoneum: Secondary | ICD-10-CM | POA: Diagnosis not present

## 2016-03-13 DIAGNOSIS — Z5111 Encounter for antineoplastic chemotherapy: Secondary | ICD-10-CM | POA: Diagnosis not present

## 2016-03-13 DIAGNOSIS — C799 Secondary malignant neoplasm of unspecified site: Secondary | ICD-10-CM | POA: Diagnosis not present

## 2016-03-13 DIAGNOSIS — C434 Malignant melanoma of scalp and neck: Secondary | ICD-10-CM | POA: Diagnosis not present

## 2016-03-13 DIAGNOSIS — C779 Secondary and unspecified malignant neoplasm of lymph node, unspecified: Secondary | ICD-10-CM | POA: Diagnosis not present

## 2016-03-13 DIAGNOSIS — C439 Malignant melanoma of skin, unspecified: Secondary | ICD-10-CM | POA: Diagnosis not present

## 2016-04-10 DIAGNOSIS — C439 Malignant melanoma of skin, unspecified: Secondary | ICD-10-CM | POA: Diagnosis not present

## 2016-04-10 DIAGNOSIS — C799 Secondary malignant neoplasm of unspecified site: Secondary | ICD-10-CM | POA: Diagnosis not present

## 2016-04-10 DIAGNOSIS — Z79899 Other long term (current) drug therapy: Secondary | ICD-10-CM | POA: Diagnosis not present

## 2016-04-10 DIAGNOSIS — C7989 Secondary malignant neoplasm of other specified sites: Secondary | ICD-10-CM | POA: Diagnosis not present

## 2016-04-10 DIAGNOSIS — C434 Malignant melanoma of scalp and neck: Secondary | ICD-10-CM | POA: Diagnosis not present

## 2016-04-10 DIAGNOSIS — C786 Secondary malignant neoplasm of retroperitoneum and peritoneum: Secondary | ICD-10-CM | POA: Diagnosis not present

## 2016-04-10 DIAGNOSIS — Z9889 Other specified postprocedural states: Secondary | ICD-10-CM | POA: Diagnosis not present

## 2016-04-10 DIAGNOSIS — Z5111 Encounter for antineoplastic chemotherapy: Secondary | ICD-10-CM | POA: Diagnosis not present

## 2016-04-10 DIAGNOSIS — C4359 Malignant melanoma of other part of trunk: Secondary | ICD-10-CM | POA: Diagnosis not present

## 2016-04-10 DIAGNOSIS — Z85828 Personal history of other malignant neoplasm of skin: Secondary | ICD-10-CM | POA: Diagnosis not present

## 2016-05-06 DIAGNOSIS — D485 Neoplasm of uncertain behavior of skin: Secondary | ICD-10-CM | POA: Diagnosis not present

## 2016-05-06 DIAGNOSIS — L821 Other seborrheic keratosis: Secondary | ICD-10-CM | POA: Diagnosis not present

## 2016-05-06 DIAGNOSIS — L570 Actinic keratosis: Secondary | ICD-10-CM | POA: Diagnosis not present

## 2016-05-06 DIAGNOSIS — Z85820 Personal history of malignant melanoma of skin: Secondary | ICD-10-CM | POA: Diagnosis not present

## 2016-05-06 DIAGNOSIS — Z85828 Personal history of other malignant neoplasm of skin: Secondary | ICD-10-CM | POA: Diagnosis not present

## 2016-05-06 DIAGNOSIS — C44622 Squamous cell carcinoma of skin of right upper limb, including shoulder: Secondary | ICD-10-CM | POA: Diagnosis not present

## 2016-05-08 DIAGNOSIS — Z5111 Encounter for antineoplastic chemotherapy: Secondary | ICD-10-CM | POA: Diagnosis not present

## 2016-05-08 DIAGNOSIS — Z85820 Personal history of malignant melanoma of skin: Secondary | ICD-10-CM | POA: Diagnosis not present

## 2016-05-08 DIAGNOSIS — C799 Secondary malignant neoplasm of unspecified site: Secondary | ICD-10-CM | POA: Diagnosis not present

## 2016-05-08 DIAGNOSIS — K869 Disease of pancreas, unspecified: Secondary | ICD-10-CM | POA: Diagnosis not present

## 2016-05-08 DIAGNOSIS — C4359 Malignant melanoma of other part of trunk: Secondary | ICD-10-CM | POA: Diagnosis not present

## 2016-05-08 DIAGNOSIS — C7889 Secondary malignant neoplasm of other digestive organs: Secondary | ICD-10-CM | POA: Diagnosis not present

## 2016-06-05 DIAGNOSIS — C799 Secondary malignant neoplasm of unspecified site: Secondary | ICD-10-CM | POA: Diagnosis not present

## 2016-06-05 DIAGNOSIS — C434 Malignant melanoma of scalp and neck: Secondary | ICD-10-CM | POA: Diagnosis not present

## 2016-06-05 DIAGNOSIS — K861 Other chronic pancreatitis: Secondary | ICD-10-CM | POA: Diagnosis not present

## 2016-06-05 DIAGNOSIS — C779 Secondary and unspecified malignant neoplasm of lymph node, unspecified: Secondary | ICD-10-CM | POA: Diagnosis not present

## 2016-06-05 DIAGNOSIS — Z5112 Encounter for antineoplastic immunotherapy: Secondary | ICD-10-CM | POA: Diagnosis not present

## 2016-06-26 DIAGNOSIS — C779 Secondary and unspecified malignant neoplasm of lymph node, unspecified: Secondary | ICD-10-CM | POA: Diagnosis not present

## 2016-06-26 DIAGNOSIS — C7889 Secondary malignant neoplasm of other digestive organs: Secondary | ICD-10-CM | POA: Diagnosis not present

## 2016-06-26 DIAGNOSIS — C799 Secondary malignant neoplasm of unspecified site: Secondary | ICD-10-CM | POA: Diagnosis not present

## 2016-06-26 DIAGNOSIS — C762 Malignant neoplasm of abdomen: Secondary | ICD-10-CM | POA: Diagnosis not present

## 2016-06-26 DIAGNOSIS — C434 Malignant melanoma of scalp and neck: Secondary | ICD-10-CM | POA: Diagnosis not present

## 2016-06-26 DIAGNOSIS — Z5112 Encounter for antineoplastic immunotherapy: Secondary | ICD-10-CM | POA: Diagnosis not present

## 2016-07-24 DIAGNOSIS — C762 Malignant neoplasm of abdomen: Secondary | ICD-10-CM | POA: Diagnosis not present

## 2016-07-24 DIAGNOSIS — C799 Secondary malignant neoplasm of unspecified site: Secondary | ICD-10-CM | POA: Diagnosis not present

## 2016-07-24 DIAGNOSIS — C4339 Malignant melanoma of other parts of face: Secondary | ICD-10-CM | POA: Diagnosis not present

## 2016-07-24 DIAGNOSIS — Z5112 Encounter for antineoplastic immunotherapy: Secondary | ICD-10-CM | POA: Diagnosis not present

## 2016-07-24 DIAGNOSIS — C434 Malignant melanoma of scalp and neck: Secondary | ICD-10-CM | POA: Diagnosis not present

## 2016-07-24 DIAGNOSIS — C779 Secondary and unspecified malignant neoplasm of lymph node, unspecified: Secondary | ICD-10-CM | POA: Diagnosis not present

## 2016-07-29 DIAGNOSIS — L821 Other seborrheic keratosis: Secondary | ICD-10-CM | POA: Diagnosis not present

## 2016-07-29 DIAGNOSIS — Z85828 Personal history of other malignant neoplasm of skin: Secondary | ICD-10-CM | POA: Diagnosis not present

## 2016-07-29 DIAGNOSIS — D485 Neoplasm of uncertain behavior of skin: Secondary | ICD-10-CM | POA: Diagnosis not present

## 2016-07-29 DIAGNOSIS — L570 Actinic keratosis: Secondary | ICD-10-CM | POA: Diagnosis not present

## 2016-07-29 DIAGNOSIS — C44311 Basal cell carcinoma of skin of nose: Secondary | ICD-10-CM | POA: Diagnosis not present

## 2016-07-29 DIAGNOSIS — Z85820 Personal history of malignant melanoma of skin: Secondary | ICD-10-CM | POA: Diagnosis not present

## 2016-08-21 DIAGNOSIS — C779 Secondary and unspecified malignant neoplasm of lymph node, unspecified: Secondary | ICD-10-CM | POA: Diagnosis not present

## 2016-08-21 DIAGNOSIS — C799 Secondary malignant neoplasm of unspecified site: Secondary | ICD-10-CM | POA: Diagnosis not present

## 2016-08-21 DIAGNOSIS — C770 Secondary and unspecified malignant neoplasm of lymph nodes of head, face and neck: Secondary | ICD-10-CM | POA: Diagnosis not present

## 2016-08-21 DIAGNOSIS — C786 Secondary malignant neoplasm of retroperitoneum and peritoneum: Secondary | ICD-10-CM | POA: Diagnosis not present

## 2016-08-21 DIAGNOSIS — C4339 Malignant melanoma of other parts of face: Secondary | ICD-10-CM | POA: Diagnosis not present

## 2016-08-21 DIAGNOSIS — C801 Malignant (primary) neoplasm, unspecified: Secondary | ICD-10-CM | POA: Diagnosis not present

## 2016-08-21 DIAGNOSIS — R9082 White matter disease, unspecified: Secondary | ICD-10-CM | POA: Diagnosis not present

## 2016-08-21 DIAGNOSIS — C434 Malignant melanoma of scalp and neck: Secondary | ICD-10-CM | POA: Diagnosis not present

## 2016-08-21 DIAGNOSIS — Z9889 Other specified postprocedural states: Secondary | ICD-10-CM | POA: Diagnosis not present

## 2016-08-21 DIAGNOSIS — Z8719 Personal history of other diseases of the digestive system: Secondary | ICD-10-CM | POA: Diagnosis not present

## 2016-08-21 DIAGNOSIS — Z5112 Encounter for antineoplastic immunotherapy: Secondary | ICD-10-CM | POA: Diagnosis not present

## 2016-08-29 DIAGNOSIS — C44311 Basal cell carcinoma of skin of nose: Secondary | ICD-10-CM | POA: Diagnosis not present

## 2016-08-29 DIAGNOSIS — Z85820 Personal history of malignant melanoma of skin: Secondary | ICD-10-CM | POA: Diagnosis not present

## 2016-08-29 DIAGNOSIS — Z85828 Personal history of other malignant neoplasm of skin: Secondary | ICD-10-CM | POA: Diagnosis not present

## 2016-09-11 DIAGNOSIS — C4339 Malignant melanoma of other parts of face: Secondary | ICD-10-CM | POA: Diagnosis not present

## 2016-09-11 DIAGNOSIS — E860 Dehydration: Secondary | ICD-10-CM | POA: Diagnosis not present

## 2016-09-11 DIAGNOSIS — C799 Secondary malignant neoplasm of unspecified site: Secondary | ICD-10-CM | POA: Diagnosis not present

## 2016-09-11 DIAGNOSIS — C434 Malignant melanoma of scalp and neck: Secondary | ICD-10-CM | POA: Diagnosis not present

## 2016-09-11 DIAGNOSIS — Z5112 Encounter for antineoplastic immunotherapy: Secondary | ICD-10-CM | POA: Diagnosis not present

## 2016-09-11 DIAGNOSIS — C779 Secondary and unspecified malignant neoplasm of lymph node, unspecified: Secondary | ICD-10-CM | POA: Diagnosis not present

## 2016-09-11 DIAGNOSIS — Z79899 Other long term (current) drug therapy: Secondary | ICD-10-CM | POA: Diagnosis not present

## 2016-10-04 DIAGNOSIS — Z9889 Other specified postprocedural states: Secondary | ICD-10-CM | POA: Diagnosis not present

## 2016-10-04 DIAGNOSIS — C762 Malignant neoplasm of abdomen: Secondary | ICD-10-CM | POA: Diagnosis not present

## 2016-10-04 DIAGNOSIS — M545 Low back pain: Secondary | ICD-10-CM | POA: Diagnosis not present

## 2016-10-04 DIAGNOSIS — C779 Secondary and unspecified malignant neoplasm of lymph node, unspecified: Secondary | ICD-10-CM | POA: Diagnosis not present

## 2016-10-04 DIAGNOSIS — Z79899 Other long term (current) drug therapy: Secondary | ICD-10-CM | POA: Diagnosis not present

## 2016-10-04 DIAGNOSIS — C799 Secondary malignant neoplasm of unspecified site: Secondary | ICD-10-CM | POA: Diagnosis not present

## 2016-10-04 DIAGNOSIS — C4491 Basal cell carcinoma of skin, unspecified: Secondary | ICD-10-CM | POA: Diagnosis not present

## 2016-10-04 DIAGNOSIS — C801 Malignant (primary) neoplasm, unspecified: Secondary | ICD-10-CM | POA: Diagnosis not present

## 2016-10-04 DIAGNOSIS — C4339 Malignant melanoma of other parts of face: Secondary | ICD-10-CM | POA: Diagnosis not present

## 2016-11-01 DIAGNOSIS — C44519 Basal cell carcinoma of skin of other part of trunk: Secondary | ICD-10-CM | POA: Diagnosis not present

## 2016-11-01 DIAGNOSIS — L570 Actinic keratosis: Secondary | ICD-10-CM | POA: Diagnosis not present

## 2016-11-01 DIAGNOSIS — C44619 Basal cell carcinoma of skin of left upper limb, including shoulder: Secondary | ICD-10-CM | POA: Diagnosis not present

## 2016-11-01 DIAGNOSIS — C799 Secondary malignant neoplasm of unspecified site: Secondary | ICD-10-CM | POA: Diagnosis not present

## 2016-11-01 DIAGNOSIS — Z5112 Encounter for antineoplastic immunotherapy: Secondary | ICD-10-CM | POA: Diagnosis not present

## 2016-11-01 DIAGNOSIS — Z85820 Personal history of malignant melanoma of skin: Secondary | ICD-10-CM | POA: Diagnosis not present

## 2016-11-01 DIAGNOSIS — D1801 Hemangioma of skin and subcutaneous tissue: Secondary | ICD-10-CM | POA: Diagnosis not present

## 2016-11-01 DIAGNOSIS — D485 Neoplasm of uncertain behavior of skin: Secondary | ICD-10-CM | POA: Diagnosis not present

## 2016-11-01 DIAGNOSIS — Z85828 Personal history of other malignant neoplasm of skin: Secondary | ICD-10-CM | POA: Diagnosis not present

## 2016-11-01 DIAGNOSIS — L821 Other seborrheic keratosis: Secondary | ICD-10-CM | POA: Diagnosis not present

## 2016-11-22 DIAGNOSIS — C801 Malignant (primary) neoplasm, unspecified: Secondary | ICD-10-CM | POA: Diagnosis not present

## 2016-11-22 DIAGNOSIS — K8689 Other specified diseases of pancreas: Secondary | ICD-10-CM | POA: Diagnosis not present

## 2016-11-22 DIAGNOSIS — C779 Secondary and unspecified malignant neoplasm of lymph node, unspecified: Secondary | ICD-10-CM | POA: Diagnosis not present

## 2016-11-22 DIAGNOSIS — C434 Malignant melanoma of scalp and neck: Secondary | ICD-10-CM | POA: Diagnosis not present

## 2016-11-22 DIAGNOSIS — Z8719 Personal history of other diseases of the digestive system: Secondary | ICD-10-CM | POA: Diagnosis not present

## 2016-11-22 DIAGNOSIS — Z85820 Personal history of malignant melanoma of skin: Secondary | ICD-10-CM | POA: Diagnosis not present

## 2016-11-22 DIAGNOSIS — Z5112 Encounter for antineoplastic immunotherapy: Secondary | ICD-10-CM | POA: Diagnosis not present

## 2016-11-22 DIAGNOSIS — C792 Secondary malignant neoplasm of skin: Secondary | ICD-10-CM | POA: Diagnosis not present

## 2016-11-22 DIAGNOSIS — Z85828 Personal history of other malignant neoplasm of skin: Secondary | ICD-10-CM | POA: Diagnosis not present

## 2016-11-22 DIAGNOSIS — C7989 Secondary malignant neoplasm of other specified sites: Secondary | ICD-10-CM | POA: Diagnosis not present

## 2016-12-18 DIAGNOSIS — C7889 Secondary malignant neoplasm of other digestive organs: Secondary | ICD-10-CM | POA: Diagnosis not present

## 2016-12-18 DIAGNOSIS — C439 Malignant melanoma of skin, unspecified: Secondary | ICD-10-CM | POA: Diagnosis not present

## 2016-12-18 DIAGNOSIS — Z8719 Personal history of other diseases of the digestive system: Secondary | ICD-10-CM | POA: Diagnosis not present

## 2016-12-18 DIAGNOSIS — Z9889 Other specified postprocedural states: Secondary | ICD-10-CM | POA: Diagnosis not present

## 2016-12-18 DIAGNOSIS — C779 Secondary and unspecified malignant neoplasm of lymph node, unspecified: Secondary | ICD-10-CM | POA: Diagnosis not present

## 2016-12-18 DIAGNOSIS — Z85828 Personal history of other malignant neoplasm of skin: Secondary | ICD-10-CM | POA: Diagnosis not present

## 2016-12-18 DIAGNOSIS — Z5112 Encounter for antineoplastic immunotherapy: Secondary | ICD-10-CM | POA: Diagnosis not present

## 2016-12-18 DIAGNOSIS — C799 Secondary malignant neoplasm of unspecified site: Secondary | ICD-10-CM | POA: Diagnosis not present

## 2016-12-18 DIAGNOSIS — C434 Malignant melanoma of scalp and neck: Secondary | ICD-10-CM | POA: Diagnosis not present

## 2016-12-25 DIAGNOSIS — C799 Secondary malignant neoplasm of unspecified site: Secondary | ICD-10-CM | POA: Diagnosis not present

## 2016-12-25 DIAGNOSIS — H6122 Impacted cerumen, left ear: Secondary | ICD-10-CM | POA: Diagnosis not present

## 2016-12-25 DIAGNOSIS — Z6830 Body mass index (BMI) 30.0-30.9, adult: Secondary | ICD-10-CM | POA: Diagnosis not present

## 2016-12-25 DIAGNOSIS — C434 Malignant melanoma of scalp and neck: Secondary | ICD-10-CM | POA: Diagnosis not present

## 2016-12-25 DIAGNOSIS — Z1389 Encounter for screening for other disorder: Secondary | ICD-10-CM | POA: Diagnosis not present

## 2016-12-25 DIAGNOSIS — Z0000 Encounter for general adult medical examination without abnormal findings: Secondary | ICD-10-CM | POA: Diagnosis not present

## 2016-12-25 DIAGNOSIS — Z79899 Other long term (current) drug therapy: Secondary | ICD-10-CM | POA: Diagnosis not present

## 2016-12-25 DIAGNOSIS — E669 Obesity, unspecified: Secondary | ICD-10-CM | POA: Diagnosis not present

## 2017-01-15 DIAGNOSIS — C4339 Malignant melanoma of other parts of face: Secondary | ICD-10-CM | POA: Diagnosis not present

## 2017-01-15 DIAGNOSIS — C439 Malignant melanoma of skin, unspecified: Secondary | ICD-10-CM | POA: Diagnosis not present

## 2017-01-15 DIAGNOSIS — C799 Secondary malignant neoplasm of unspecified site: Secondary | ICD-10-CM | POA: Diagnosis not present

## 2017-01-15 DIAGNOSIS — Z5111 Encounter for antineoplastic chemotherapy: Secondary | ICD-10-CM | POA: Diagnosis not present

## 2017-01-15 DIAGNOSIS — K8689 Other specified diseases of pancreas: Secondary | ICD-10-CM | POA: Diagnosis not present

## 2017-01-15 DIAGNOSIS — C434 Malignant melanoma of scalp and neck: Secondary | ICD-10-CM | POA: Diagnosis not present

## 2017-01-15 DIAGNOSIS — Z85828 Personal history of other malignant neoplasm of skin: Secondary | ICD-10-CM | POA: Diagnosis not present

## 2017-01-15 DIAGNOSIS — C779 Secondary and unspecified malignant neoplasm of lymph node, unspecified: Secondary | ICD-10-CM | POA: Diagnosis not present

## 2017-02-12 DIAGNOSIS — C779 Secondary and unspecified malignant neoplasm of lymph node, unspecified: Secondary | ICD-10-CM | POA: Diagnosis not present

## 2017-02-12 DIAGNOSIS — C7889 Secondary malignant neoplasm of other digestive organs: Secondary | ICD-10-CM | POA: Diagnosis not present

## 2017-02-12 DIAGNOSIS — Z9889 Other specified postprocedural states: Secondary | ICD-10-CM | POA: Diagnosis not present

## 2017-02-12 DIAGNOSIS — Z85828 Personal history of other malignant neoplasm of skin: Secondary | ICD-10-CM | POA: Diagnosis not present

## 2017-02-12 DIAGNOSIS — C762 Malignant neoplasm of abdomen: Secondary | ICD-10-CM | POA: Diagnosis not present

## 2017-02-12 DIAGNOSIS — C799 Secondary malignant neoplasm of unspecified site: Secondary | ICD-10-CM | POA: Diagnosis not present

## 2017-02-12 DIAGNOSIS — Z5112 Encounter for antineoplastic immunotherapy: Secondary | ICD-10-CM | POA: Diagnosis not present

## 2017-02-12 DIAGNOSIS — Z85820 Personal history of malignant melanoma of skin: Secondary | ICD-10-CM | POA: Diagnosis not present

## 2017-02-12 DIAGNOSIS — L299 Pruritus, unspecified: Secondary | ICD-10-CM | POA: Diagnosis not present

## 2017-02-12 DIAGNOSIS — K859 Acute pancreatitis without necrosis or infection, unspecified: Secondary | ICD-10-CM | POA: Diagnosis not present

## 2017-02-12 DIAGNOSIS — C434 Malignant melanoma of scalp and neck: Secondary | ICD-10-CM | POA: Diagnosis not present

## 2017-02-14 DIAGNOSIS — D485 Neoplasm of uncertain behavior of skin: Secondary | ICD-10-CM | POA: Diagnosis not present

## 2017-02-14 DIAGNOSIS — C44519 Basal cell carcinoma of skin of other part of trunk: Secondary | ICD-10-CM | POA: Diagnosis not present

## 2017-02-14 DIAGNOSIS — Z85828 Personal history of other malignant neoplasm of skin: Secondary | ICD-10-CM | POA: Diagnosis not present

## 2017-02-14 DIAGNOSIS — L718 Other rosacea: Secondary | ICD-10-CM | POA: Diagnosis not present

## 2017-02-14 DIAGNOSIS — D2262 Melanocytic nevi of left upper limb, including shoulder: Secondary | ICD-10-CM | POA: Diagnosis not present

## 2017-02-14 DIAGNOSIS — L821 Other seborrheic keratosis: Secondary | ICD-10-CM | POA: Diagnosis not present

## 2017-02-14 DIAGNOSIS — D1801 Hemangioma of skin and subcutaneous tissue: Secondary | ICD-10-CM | POA: Diagnosis not present

## 2017-02-14 DIAGNOSIS — Z85820 Personal history of malignant melanoma of skin: Secondary | ICD-10-CM | POA: Diagnosis not present

## 2017-02-18 DIAGNOSIS — H2513 Age-related nuclear cataract, bilateral: Secondary | ICD-10-CM | POA: Diagnosis not present

## 2017-02-18 DIAGNOSIS — D3131 Benign neoplasm of right choroid: Secondary | ICD-10-CM | POA: Diagnosis not present

## 2017-02-18 DIAGNOSIS — H40013 Open angle with borderline findings, low risk, bilateral: Secondary | ICD-10-CM | POA: Diagnosis not present

## 2017-03-05 DIAGNOSIS — C762 Malignant neoplasm of abdomen: Secondary | ICD-10-CM | POA: Diagnosis not present

## 2017-03-05 DIAGNOSIS — K869 Disease of pancreas, unspecified: Secondary | ICD-10-CM | POA: Diagnosis not present

## 2017-03-05 DIAGNOSIS — K859 Acute pancreatitis without necrosis or infection, unspecified: Secondary | ICD-10-CM | POA: Diagnosis not present

## 2017-03-05 DIAGNOSIS — C7989 Secondary malignant neoplasm of other specified sites: Secondary | ICD-10-CM | POA: Diagnosis not present

## 2017-03-05 DIAGNOSIS — C779 Secondary and unspecified malignant neoplasm of lymph node, unspecified: Secondary | ICD-10-CM | POA: Diagnosis not present

## 2017-03-05 DIAGNOSIS — C434 Malignant melanoma of scalp and neck: Secondary | ICD-10-CM | POA: Diagnosis not present

## 2017-03-05 DIAGNOSIS — Z9889 Other specified postprocedural states: Secondary | ICD-10-CM | POA: Diagnosis not present

## 2017-03-05 DIAGNOSIS — Z5112 Encounter for antineoplastic immunotherapy: Secondary | ICD-10-CM | POA: Diagnosis not present

## 2017-03-26 DIAGNOSIS — C779 Secondary and unspecified malignant neoplasm of lymph node, unspecified: Secondary | ICD-10-CM | POA: Diagnosis not present

## 2017-03-26 DIAGNOSIS — C7889 Secondary malignant neoplasm of other digestive organs: Secondary | ICD-10-CM | POA: Diagnosis not present

## 2017-03-26 DIAGNOSIS — C434 Malignant melanoma of scalp and neck: Secondary | ICD-10-CM | POA: Diagnosis not present

## 2017-03-26 DIAGNOSIS — C4359 Malignant melanoma of other part of trunk: Secondary | ICD-10-CM | POA: Diagnosis not present

## 2017-03-26 DIAGNOSIS — C7989 Secondary malignant neoplasm of other specified sites: Secondary | ICD-10-CM | POA: Diagnosis not present

## 2017-03-26 DIAGNOSIS — C762 Malignant neoplasm of abdomen: Secondary | ICD-10-CM | POA: Diagnosis not present

## 2017-03-26 DIAGNOSIS — Z5112 Encounter for antineoplastic immunotherapy: Secondary | ICD-10-CM | POA: Diagnosis not present

## 2017-04-23 DIAGNOSIS — K859 Acute pancreatitis without necrosis or infection, unspecified: Secondary | ICD-10-CM | POA: Diagnosis not present

## 2017-04-23 DIAGNOSIS — C762 Malignant neoplasm of abdomen: Secondary | ICD-10-CM | POA: Diagnosis not present

## 2017-04-23 DIAGNOSIS — C434 Malignant melanoma of scalp and neck: Secondary | ICD-10-CM | POA: Diagnosis not present

## 2017-04-23 DIAGNOSIS — Z5112 Encounter for antineoplastic immunotherapy: Secondary | ICD-10-CM | POA: Diagnosis not present

## 2017-04-23 DIAGNOSIS — C799 Secondary malignant neoplasm of unspecified site: Secondary | ICD-10-CM | POA: Diagnosis not present

## 2017-04-23 DIAGNOSIS — C779 Secondary and unspecified malignant neoplasm of lymph node, unspecified: Secondary | ICD-10-CM | POA: Diagnosis not present

## 2017-04-23 DIAGNOSIS — C7989 Secondary malignant neoplasm of other specified sites: Secondary | ICD-10-CM | POA: Diagnosis not present

## 2017-05-14 DIAGNOSIS — Z79899 Other long term (current) drug therapy: Secondary | ICD-10-CM | POA: Diagnosis not present

## 2017-05-14 DIAGNOSIS — K8681 Exocrine pancreatic insufficiency: Secondary | ICD-10-CM | POA: Diagnosis not present

## 2017-05-14 DIAGNOSIS — Z85820 Personal history of malignant melanoma of skin: Secondary | ICD-10-CM | POA: Diagnosis not present

## 2017-05-14 DIAGNOSIS — K859 Acute pancreatitis without necrosis or infection, unspecified: Secondary | ICD-10-CM | POA: Diagnosis not present

## 2017-05-14 DIAGNOSIS — C7889 Secondary malignant neoplasm of other digestive organs: Secondary | ICD-10-CM | POA: Diagnosis not present

## 2017-05-16 DIAGNOSIS — Z85820 Personal history of malignant melanoma of skin: Secondary | ICD-10-CM | POA: Diagnosis not present

## 2017-05-16 DIAGNOSIS — L578 Other skin changes due to chronic exposure to nonionizing radiation: Secondary | ICD-10-CM | POA: Diagnosis not present

## 2017-05-16 DIAGNOSIS — D485 Neoplasm of uncertain behavior of skin: Secondary | ICD-10-CM | POA: Diagnosis not present

## 2017-05-16 DIAGNOSIS — D1801 Hemangioma of skin and subcutaneous tissue: Secondary | ICD-10-CM | POA: Diagnosis not present

## 2017-05-16 DIAGNOSIS — L821 Other seborrheic keratosis: Secondary | ICD-10-CM | POA: Diagnosis not present

## 2017-05-16 DIAGNOSIS — L812 Freckles: Secondary | ICD-10-CM | POA: Diagnosis not present

## 2017-05-16 DIAGNOSIS — Z85828 Personal history of other malignant neoplasm of skin: Secondary | ICD-10-CM | POA: Diagnosis not present

## 2017-06-04 DIAGNOSIS — Z5111 Encounter for antineoplastic chemotherapy: Secondary | ICD-10-CM | POA: Diagnosis not present

## 2017-06-04 DIAGNOSIS — C434 Malignant melanoma of scalp and neck: Secondary | ICD-10-CM | POA: Diagnosis not present

## 2017-06-04 DIAGNOSIS — C762 Malignant neoplasm of abdomen: Secondary | ICD-10-CM | POA: Diagnosis not present

## 2017-06-04 DIAGNOSIS — C799 Secondary malignant neoplasm of unspecified site: Secondary | ICD-10-CM | POA: Diagnosis not present

## 2017-06-12 DIAGNOSIS — C799 Secondary malignant neoplasm of unspecified site: Secondary | ICD-10-CM | POA: Diagnosis not present

## 2017-06-12 DIAGNOSIS — J209 Acute bronchitis, unspecified: Secondary | ICD-10-CM | POA: Diagnosis not present

## 2017-06-12 DIAGNOSIS — J069 Acute upper respiratory infection, unspecified: Secondary | ICD-10-CM | POA: Diagnosis not present

## 2017-06-19 ENCOUNTER — Other Ambulatory Visit (HOSPITAL_COMMUNITY): Payer: Self-pay | Admitting: Respiratory Therapy

## 2017-06-19 DIAGNOSIS — R05 Cough: Secondary | ICD-10-CM | POA: Diagnosis not present

## 2017-06-19 DIAGNOSIS — R059 Cough, unspecified: Secondary | ICD-10-CM

## 2017-06-19 DIAGNOSIS — Z23 Encounter for immunization: Secondary | ICD-10-CM | POA: Diagnosis not present

## 2017-06-19 DIAGNOSIS — C439 Malignant melanoma of skin, unspecified: Secondary | ICD-10-CM | POA: Diagnosis not present

## 2017-06-19 DIAGNOSIS — J309 Allergic rhinitis, unspecified: Secondary | ICD-10-CM | POA: Diagnosis not present

## 2017-06-19 DIAGNOSIS — C799 Secondary malignant neoplasm of unspecified site: Secondary | ICD-10-CM | POA: Diagnosis not present

## 2017-06-20 ENCOUNTER — Ambulatory Visit (HOSPITAL_COMMUNITY)
Admission: RE | Admit: 2017-06-20 | Discharge: 2017-06-20 | Disposition: A | Discharge status: Home / Self Care | Payer: Medicare Other | Source: Ambulatory Visit | Attending: Geriatric Medicine | Admitting: Geriatric Medicine

## 2017-06-20 DIAGNOSIS — R05 Cough: Secondary | ICD-10-CM | POA: Diagnosis not present

## 2017-06-20 DIAGNOSIS — R059 Cough, unspecified: Secondary | ICD-10-CM

## 2017-06-20 LAB — SPIROMETRY WITH GRAPH
FEF 25-75 Post: 3.8 L/sec
FEF 25-75 Pre: 2.66 L/sec
FEF2575-%Change-Post: 42 %
FEF2575-%Pred-Post: 215 %
FEF2575-%Pred-Pre: 151 %
FEV1-%Change-Post: 7 %
FEV1-%Pred-Post: 107 %
FEV1-%Pred-Pre: 100 %
FEV1-Post: 2.9 L
FEV1-Pre: 2.7 L
FEV1FVC-%Change-Post: 7 %
FEV1FVC-%Pred-Pre: 112 %
FEV6-%Change-Post: 1 %
FEV6-%Pred-Post: 94 %
FEV6-%Pred-Pre: 93 %
FEV6-Post: 3.38 L
FEV6-Pre: 3.33 L
FEV6FVC-%Change-Post: 1 %
FEV6FVC-%Pred-Post: 107 %
FEV6FVC-%Pred-Pre: 105 %
FVC-%Change-Post: 0 %
FVC-%Pred-Post: 87 %
FVC-%Pred-Pre: 87 %
FVC-Post: 3.38 L
FVC-Pre: 3.39 L
Post FEV1/FVC ratio: 86 %
Post FEV6/FVC ratio: 100 %
Pre FEV1/FVC ratio: 80 %
Pre FEV6/FVC Ratio: 98 %

## 2017-06-20 MED ORDER — ALBUTEROL SULFATE (2.5 MG/3ML) 0.083% IN NEBU
2.5000 mg | INHALATION_SOLUTION | Freq: Once | RESPIRATORY_TRACT | Status: AC
Start: 2017-06-20 — End: 2017-06-20
  Administered 2017-06-20: 2.5 mg via RESPIRATORY_TRACT

## 2017-06-30 ENCOUNTER — Ambulatory Visit
Admission: RE | Admit: 2017-06-30 | Discharge: 2017-06-30 | Disposition: A | Discharge status: Home / Self Care | Payer: Medicare Other | Source: Ambulatory Visit | Attending: Geriatric Medicine | Admitting: Geriatric Medicine

## 2017-06-30 ENCOUNTER — Other Ambulatory Visit: Payer: Self-pay | Admitting: Geriatric Medicine

## 2017-06-30 DIAGNOSIS — J309 Allergic rhinitis, unspecified: Secondary | ICD-10-CM | POA: Diagnosis not present

## 2017-06-30 DIAGNOSIS — R05 Cough: Secondary | ICD-10-CM

## 2017-06-30 DIAGNOSIS — R059 Cough, unspecified: Secondary | ICD-10-CM

## 2017-09-19 DIAGNOSIS — C434 Malignant melanoma of scalp and neck: Secondary | ICD-10-CM | POA: Diagnosis not present

## 2017-09-19 DIAGNOSIS — C799 Secondary malignant neoplasm of unspecified site: Secondary | ICD-10-CM | POA: Diagnosis not present

## 2017-09-19 DIAGNOSIS — C439 Malignant melanoma of skin, unspecified: Secondary | ICD-10-CM | POA: Diagnosis not present

## 2017-09-19 DIAGNOSIS — M7989 Other specified soft tissue disorders: Secondary | ICD-10-CM | POA: Diagnosis not present

## 2017-09-29 DIAGNOSIS — Z85820 Personal history of malignant melanoma of skin: Secondary | ICD-10-CM | POA: Diagnosis not present

## 2017-09-29 DIAGNOSIS — L570 Actinic keratosis: Secondary | ICD-10-CM | POA: Diagnosis not present

## 2017-09-29 DIAGNOSIS — C44529 Squamous cell carcinoma of skin of other part of trunk: Secondary | ICD-10-CM | POA: Diagnosis not present

## 2017-09-29 DIAGNOSIS — L812 Freckles: Secondary | ICD-10-CM | POA: Diagnosis not present

## 2017-09-29 DIAGNOSIS — L218 Other seborrheic dermatitis: Secondary | ICD-10-CM | POA: Diagnosis not present

## 2017-09-29 DIAGNOSIS — L821 Other seborrheic keratosis: Secondary | ICD-10-CM | POA: Diagnosis not present

## 2017-09-29 DIAGNOSIS — Z85828 Personal history of other malignant neoplasm of skin: Secondary | ICD-10-CM | POA: Diagnosis not present

## 2017-09-29 DIAGNOSIS — D485 Neoplasm of uncertain behavior of skin: Secondary | ICD-10-CM | POA: Diagnosis not present

## 2017-09-29 DIAGNOSIS — D1801 Hemangioma of skin and subcutaneous tissue: Secondary | ICD-10-CM | POA: Diagnosis not present

## 2017-10-27 ENCOUNTER — Other Ambulatory Visit: Payer: Self-pay | Admitting: Internal Medicine

## 2017-10-27 ENCOUNTER — Ambulatory Visit
Admission: RE | Admit: 2017-10-27 | Discharge: 2017-10-27 | Disposition: A | Discharge status: Home / Self Care | Payer: Medicare Other | Source: Ambulatory Visit | Attending: Internal Medicine | Admitting: Internal Medicine

## 2017-10-27 DIAGNOSIS — M542 Cervicalgia: Secondary | ICD-10-CM | POA: Diagnosis not present

## 2017-10-27 DIAGNOSIS — J029 Acute pharyngitis, unspecified: Secondary | ICD-10-CM | POA: Diagnosis not present

## 2017-10-27 MED ORDER — IOPAMIDOL (ISOVUE-300) INJECTION 61%
75.0000 mL | Freq: Once | INTRAVENOUS | Status: AC | PRN
Start: 2017-10-27 — End: 2017-10-27
  Administered 2017-10-27: 75 mL via INTRAVENOUS

## 2017-10-29 DIAGNOSIS — J390 Retropharyngeal and parapharyngeal abscess: Secondary | ICD-10-CM | POA: Diagnosis not present

## 2017-11-04 DIAGNOSIS — J390 Retropharyngeal and parapharyngeal abscess: Secondary | ICD-10-CM | POA: Diagnosis not present

## 2017-11-06 ENCOUNTER — Other Ambulatory Visit: Payer: Self-pay | Admitting: Geriatric Medicine

## 2017-11-06 DIAGNOSIS — M545 Low back pain: Secondary | ICD-10-CM | POA: Diagnosis not present

## 2017-11-11 ENCOUNTER — Ambulatory Visit
Admission: RE | Admit: 2017-11-11 | Discharge: 2017-11-11 | Disposition: A | Discharge status: Home / Self Care | Payer: Medicare Other | Source: Ambulatory Visit | Attending: Geriatric Medicine | Admitting: Geriatric Medicine

## 2017-11-11 DIAGNOSIS — M545 Low back pain: Secondary | ICD-10-CM

## 2017-11-11 DIAGNOSIS — M48061 Spinal stenosis, lumbar region without neurogenic claudication: Secondary | ICD-10-CM | POA: Diagnosis not present

## 2017-11-11 MED ORDER — GADOBENATE DIMEGLUMINE 529 MG/ML IV SOLN
19.0000 mL | Freq: Once | INTRAVENOUS | Status: AC | PRN
  Administered 2017-11-11: 19 mL via INTRAVENOUS

## 2017-11-13 ENCOUNTER — Other Ambulatory Visit: Payer: Medicare Other

## 2017-11-21 DIAGNOSIS — C7889 Secondary malignant neoplasm of other digestive organs: Secondary | ICD-10-CM | POA: Diagnosis not present

## 2017-11-21 DIAGNOSIS — C434 Malignant melanoma of scalp and neck: Secondary | ICD-10-CM | POA: Diagnosis not present

## 2017-11-21 DIAGNOSIS — C7989 Secondary malignant neoplasm of other specified sites: Secondary | ICD-10-CM | POA: Diagnosis not present

## 2017-11-21 DIAGNOSIS — Z79899 Other long term (current) drug therapy: Secondary | ICD-10-CM | POA: Diagnosis not present

## 2017-12-10 DIAGNOSIS — M5416 Radiculopathy, lumbar region: Secondary | ICD-10-CM | POA: Diagnosis not present

## 2017-12-10 DIAGNOSIS — M545 Low back pain: Secondary | ICD-10-CM | POA: Diagnosis not present

## 2017-12-15 DIAGNOSIS — M5416 Radiculopathy, lumbar region: Secondary | ICD-10-CM | POA: Diagnosis not present

## 2017-12-15 DIAGNOSIS — M545 Low back pain: Secondary | ICD-10-CM | POA: Diagnosis not present

## 2017-12-18 DIAGNOSIS — M545 Low back pain: Secondary | ICD-10-CM | POA: Diagnosis not present

## 2017-12-18 DIAGNOSIS — M5416 Radiculopathy, lumbar region: Secondary | ICD-10-CM | POA: Diagnosis not present

## 2017-12-25 DIAGNOSIS — M5416 Radiculopathy, lumbar region: Secondary | ICD-10-CM | POA: Diagnosis not present

## 2017-12-25 DIAGNOSIS — M545 Low back pain: Secondary | ICD-10-CM | POA: Diagnosis not present

## 2017-12-29 DIAGNOSIS — L821 Other seborrheic keratosis: Secondary | ICD-10-CM | POA: Diagnosis not present

## 2017-12-29 DIAGNOSIS — M545 Low back pain: Secondary | ICD-10-CM | POA: Diagnosis not present

## 2017-12-29 DIAGNOSIS — D2261 Melanocytic nevi of right upper limb, including shoulder: Secondary | ICD-10-CM | POA: Diagnosis not present

## 2017-12-29 DIAGNOSIS — Z0000 Encounter for general adult medical examination without abnormal findings: Secondary | ICD-10-CM | POA: Diagnosis not present

## 2017-12-29 DIAGNOSIS — Z85828 Personal history of other malignant neoplasm of skin: Secondary | ICD-10-CM | POA: Diagnosis not present

## 2017-12-29 DIAGNOSIS — D2272 Melanocytic nevi of left lower limb, including hip: Secondary | ICD-10-CM | POA: Diagnosis not present

## 2017-12-29 DIAGNOSIS — Z85820 Personal history of malignant melanoma of skin: Secondary | ICD-10-CM | POA: Diagnosis not present

## 2017-12-29 DIAGNOSIS — D1801 Hemangioma of skin and subcutaneous tissue: Secondary | ICD-10-CM | POA: Diagnosis not present

## 2017-12-29 DIAGNOSIS — D692 Other nonthrombocytopenic purpura: Secondary | ICD-10-CM | POA: Diagnosis not present

## 2017-12-29 DIAGNOSIS — Z1389 Encounter for screening for other disorder: Secondary | ICD-10-CM | POA: Diagnosis not present

## 2017-12-29 DIAGNOSIS — L570 Actinic keratosis: Secondary | ICD-10-CM | POA: Diagnosis not present

## 2018-01-16 DIAGNOSIS — C439 Malignant melanoma of skin, unspecified: Secondary | ICD-10-CM | POA: Diagnosis not present

## 2018-01-16 DIAGNOSIS — Z8507 Personal history of malignant neoplasm of pancreas: Secondary | ICD-10-CM | POA: Diagnosis not present

## 2018-01-16 DIAGNOSIS — Z79899 Other long term (current) drug therapy: Secondary | ICD-10-CM | POA: Diagnosis not present

## 2018-01-16 DIAGNOSIS — C434 Malignant melanoma of scalp and neck: Secondary | ICD-10-CM | POA: Diagnosis not present

## 2018-01-16 DIAGNOSIS — C779 Secondary and unspecified malignant neoplasm of lymph node, unspecified: Secondary | ICD-10-CM | POA: Diagnosis not present

## 2018-01-16 DIAGNOSIS — R918 Other nonspecific abnormal finding of lung field: Secondary | ICD-10-CM | POA: Diagnosis not present

## 2018-01-16 DIAGNOSIS — C4339 Malignant melanoma of other parts of face: Secondary | ICD-10-CM | POA: Diagnosis not present

## 2018-01-16 DIAGNOSIS — K8689 Other specified diseases of pancreas: Secondary | ICD-10-CM | POA: Diagnosis not present

## 2018-01-16 DIAGNOSIS — C799 Secondary malignant neoplasm of unspecified site: Secondary | ICD-10-CM | POA: Diagnosis not present

## 2018-01-16 DIAGNOSIS — N281 Cyst of kidney, acquired: Secondary | ICD-10-CM | POA: Diagnosis not present

## 2018-01-16 DIAGNOSIS — Z85828 Personal history of other malignant neoplasm of skin: Secondary | ICD-10-CM | POA: Diagnosis not present

## 2018-02-12 DIAGNOSIS — M779 Enthesopathy, unspecified: Secondary | ICD-10-CM | POA: Diagnosis not present

## 2018-03-02 DIAGNOSIS — H40013 Open angle with borderline findings, low risk, bilateral: Secondary | ICD-10-CM | POA: Diagnosis not present

## 2018-03-02 DIAGNOSIS — D3131 Benign neoplasm of right choroid: Secondary | ICD-10-CM | POA: Diagnosis not present

## 2018-03-02 DIAGNOSIS — H532 Diplopia: Secondary | ICD-10-CM | POA: Diagnosis not present

## 2018-03-02 DIAGNOSIS — H2513 Age-related nuclear cataract, bilateral: Secondary | ICD-10-CM | POA: Diagnosis not present

## 2018-04-24 DIAGNOSIS — C762 Malignant neoplasm of abdomen: Secondary | ICD-10-CM | POA: Diagnosis not present

## 2018-04-24 DIAGNOSIS — C779 Secondary and unspecified malignant neoplasm of lymph node, unspecified: Secondary | ICD-10-CM | POA: Diagnosis not present

## 2018-04-24 DIAGNOSIS — C434 Malignant melanoma of scalp and neck: Secondary | ICD-10-CM | POA: Diagnosis not present

## 2018-04-24 DIAGNOSIS — C799 Secondary malignant neoplasm of unspecified site: Secondary | ICD-10-CM | POA: Diagnosis not present

## 2018-04-24 DIAGNOSIS — C439 Malignant melanoma of skin, unspecified: Secondary | ICD-10-CM | POA: Diagnosis not present

## 2018-07-22 DIAGNOSIS — Z23 Encounter for immunization: Secondary | ICD-10-CM | POA: Diagnosis not present

## 2018-07-23 DIAGNOSIS — D7589 Other specified diseases of blood and blood-forming organs: Secondary | ICD-10-CM | POA: Diagnosis not present

## 2018-07-23 DIAGNOSIS — K5901 Slow transit constipation: Secondary | ICD-10-CM | POA: Diagnosis not present

## 2018-07-23 DIAGNOSIS — R600 Localized edema: Secondary | ICD-10-CM | POA: Diagnosis not present

## 2018-07-31 DIAGNOSIS — C4490 Unspecified malignant neoplasm of skin, unspecified: Secondary | ICD-10-CM | POA: Diagnosis not present

## 2018-07-31 DIAGNOSIS — C434 Malignant melanoma of scalp and neck: Secondary | ICD-10-CM | POA: Diagnosis not present

## 2018-07-31 DIAGNOSIS — C799 Secondary malignant neoplasm of unspecified site: Secondary | ICD-10-CM | POA: Diagnosis not present

## 2018-07-31 DIAGNOSIS — J984 Other disorders of lung: Secondary | ICD-10-CM | POA: Diagnosis not present

## 2018-07-31 DIAGNOSIS — C779 Secondary and unspecified malignant neoplasm of lymph node, unspecified: Secondary | ICD-10-CM | POA: Diagnosis not present

## 2018-07-31 DIAGNOSIS — R918 Other nonspecific abnormal finding of lung field: Secondary | ICD-10-CM | POA: Diagnosis not present

## 2018-07-31 DIAGNOSIS — C439 Malignant melanoma of skin, unspecified: Secondary | ICD-10-CM | POA: Diagnosis not present

## 2018-07-31 DIAGNOSIS — Z85828 Personal history of other malignant neoplasm of skin: Secondary | ICD-10-CM | POA: Diagnosis not present

## 2018-07-31 DIAGNOSIS — N281 Cyst of kidney, acquired: Secondary | ICD-10-CM | POA: Diagnosis not present

## 2018-07-31 DIAGNOSIS — Z8507 Personal history of malignant neoplasm of pancreas: Secondary | ICD-10-CM | POA: Diagnosis not present

## 2018-07-31 DIAGNOSIS — C762 Malignant neoplasm of abdomen: Secondary | ICD-10-CM | POA: Diagnosis not present

## 2018-07-31 DIAGNOSIS — N3289 Other specified disorders of bladder: Secondary | ICD-10-CM | POA: Diagnosis not present

## 2018-07-31 DIAGNOSIS — K5730 Diverticulosis of large intestine without perforation or abscess without bleeding: Secondary | ICD-10-CM | POA: Diagnosis not present

## 2018-07-31 DIAGNOSIS — Z8719 Personal history of other diseases of the digestive system: Secondary | ICD-10-CM | POA: Diagnosis not present

## 2018-07-31 DIAGNOSIS — N400 Enlarged prostate without lower urinary tract symptoms: Secondary | ICD-10-CM | POA: Diagnosis not present

## 2018-09-14 DIAGNOSIS — L570 Actinic keratosis: Secondary | ICD-10-CM | POA: Diagnosis not present

## 2018-09-14 DIAGNOSIS — L821 Other seborrheic keratosis: Secondary | ICD-10-CM | POA: Diagnosis not present

## 2018-09-14 DIAGNOSIS — L820 Inflamed seborrheic keratosis: Secondary | ICD-10-CM | POA: Diagnosis not present

## 2018-09-14 DIAGNOSIS — D485 Neoplasm of uncertain behavior of skin: Secondary | ICD-10-CM | POA: Diagnosis not present

## 2018-09-14 DIAGNOSIS — C44311 Basal cell carcinoma of skin of nose: Secondary | ICD-10-CM | POA: Diagnosis not present

## 2018-09-14 DIAGNOSIS — Z85820 Personal history of malignant melanoma of skin: Secondary | ICD-10-CM | POA: Diagnosis not present

## 2018-09-14 DIAGNOSIS — Z85828 Personal history of other malignant neoplasm of skin: Secondary | ICD-10-CM | POA: Diagnosis not present

## 2018-09-14 DIAGNOSIS — D1801 Hemangioma of skin and subcutaneous tissue: Secondary | ICD-10-CM | POA: Diagnosis not present

## 2018-09-14 DIAGNOSIS — C44319 Basal cell carcinoma of skin of other parts of face: Secondary | ICD-10-CM | POA: Diagnosis not present

## 2018-09-14 DIAGNOSIS — C44329 Squamous cell carcinoma of skin of other parts of face: Secondary | ICD-10-CM | POA: Diagnosis not present

## 2018-11-16 DIAGNOSIS — J069 Acute upper respiratory infection, unspecified: Secondary | ICD-10-CM | POA: Diagnosis not present

## 2018-11-23 DIAGNOSIS — J209 Acute bronchitis, unspecified: Secondary | ICD-10-CM | POA: Diagnosis not present

## 2018-12-07 DIAGNOSIS — C439 Malignant melanoma of skin, unspecified: Secondary | ICD-10-CM | POA: Diagnosis not present

## 2018-12-07 DIAGNOSIS — C779 Secondary and unspecified malignant neoplasm of lymph node, unspecified: Secondary | ICD-10-CM | POA: Diagnosis not present

## 2018-12-21 DIAGNOSIS — D1801 Hemangioma of skin and subcutaneous tissue: Secondary | ICD-10-CM | POA: Diagnosis not present

## 2018-12-21 DIAGNOSIS — Z85820 Personal history of malignant melanoma of skin: Secondary | ICD-10-CM | POA: Diagnosis not present

## 2018-12-21 DIAGNOSIS — L812 Freckles: Secondary | ICD-10-CM | POA: Diagnosis not present

## 2018-12-21 DIAGNOSIS — L570 Actinic keratosis: Secondary | ICD-10-CM | POA: Diagnosis not present

## 2018-12-21 DIAGNOSIS — L821 Other seborrheic keratosis: Secondary | ICD-10-CM | POA: Diagnosis not present

## 2018-12-21 DIAGNOSIS — D485 Neoplasm of uncertain behavior of skin: Secondary | ICD-10-CM | POA: Diagnosis not present

## 2018-12-21 DIAGNOSIS — C44519 Basal cell carcinoma of skin of other part of trunk: Secondary | ICD-10-CM | POA: Diagnosis not present

## 2018-12-21 DIAGNOSIS — Z85828 Personal history of other malignant neoplasm of skin: Secondary | ICD-10-CM | POA: Diagnosis not present

## 2019-01-29 DIAGNOSIS — D8989 Other specified disorders involving the immune mechanism, not elsewhere classified: Secondary | ICD-10-CM | POA: Diagnosis not present

## 2019-01-29 DIAGNOSIS — C762 Malignant neoplasm of abdomen: Secondary | ICD-10-CM | POA: Diagnosis not present

## 2019-01-29 DIAGNOSIS — C4490 Unspecified malignant neoplasm of skin, unspecified: Secondary | ICD-10-CM | POA: Diagnosis not present

## 2019-01-29 DIAGNOSIS — Z8719 Personal history of other diseases of the digestive system: Secondary | ICD-10-CM | POA: Diagnosis not present

## 2019-01-29 DIAGNOSIS — C434 Malignant melanoma of scalp and neck: Secondary | ICD-10-CM | POA: Diagnosis not present

## 2019-01-29 DIAGNOSIS — Z85820 Personal history of malignant melanoma of skin: Secondary | ICD-10-CM | POA: Diagnosis not present

## 2019-01-29 DIAGNOSIS — C7889 Secondary malignant neoplasm of other digestive organs: Secondary | ICD-10-CM | POA: Diagnosis not present

## 2019-01-29 DIAGNOSIS — C779 Secondary and unspecified malignant neoplasm of lymph node, unspecified: Secondary | ICD-10-CM | POA: Diagnosis not present

## 2019-01-29 DIAGNOSIS — Z8507 Personal history of malignant neoplasm of pancreas: Secondary | ICD-10-CM | POA: Diagnosis not present

## 2019-03-03 DIAGNOSIS — H43811 Vitreous degeneration, right eye: Secondary | ICD-10-CM | POA: Diagnosis not present

## 2019-03-03 DIAGNOSIS — H0102B Squamous blepharitis left eye, upper and lower eyelids: Secondary | ICD-10-CM | POA: Diagnosis not present

## 2019-03-03 DIAGNOSIS — H40013 Open angle with borderline findings, low risk, bilateral: Secondary | ICD-10-CM | POA: Diagnosis not present

## 2019-03-03 DIAGNOSIS — H0102A Squamous blepharitis right eye, upper and lower eyelids: Secondary | ICD-10-CM | POA: Diagnosis not present

## 2019-03-03 DIAGNOSIS — H2513 Age-related nuclear cataract, bilateral: Secondary | ICD-10-CM | POA: Diagnosis not present

## 2019-03-03 DIAGNOSIS — D3131 Benign neoplasm of right choroid: Secondary | ICD-10-CM | POA: Diagnosis not present

## 2019-03-29 DIAGNOSIS — C799 Secondary malignant neoplasm of unspecified site: Secondary | ICD-10-CM | POA: Diagnosis not present

## 2019-03-29 DIAGNOSIS — R000 Tachycardia, unspecified: Secondary | ICD-10-CM | POA: Diagnosis not present

## 2019-03-29 DIAGNOSIS — C439 Malignant melanoma of skin, unspecified: Secondary | ICD-10-CM | POA: Diagnosis not present

## 2019-03-29 DIAGNOSIS — E538 Deficiency of other specified B group vitamins: Secondary | ICD-10-CM | POA: Diagnosis not present

## 2019-03-29 DIAGNOSIS — Z79899 Other long term (current) drug therapy: Secondary | ICD-10-CM | POA: Diagnosis not present

## 2019-03-29 DIAGNOSIS — Z0000 Encounter for general adult medical examination without abnormal findings: Secondary | ICD-10-CM | POA: Diagnosis not present

## 2019-03-29 DIAGNOSIS — Z1389 Encounter for screening for other disorder: Secondary | ICD-10-CM | POA: Diagnosis not present

## 2019-03-30 ENCOUNTER — Other Ambulatory Visit (HOSPITAL_COMMUNITY): Payer: Self-pay | Admitting: Geriatric Medicine

## 2019-03-30 ENCOUNTER — Other Ambulatory Visit: Payer: Self-pay

## 2019-03-30 ENCOUNTER — Encounter (HOSPITAL_COMMUNITY): Payer: Self-pay

## 2019-03-30 ENCOUNTER — Ambulatory Visit (HOSPITAL_COMMUNITY): Payer: Medicare Other | Attending: Internal Medicine

## 2019-03-30 ENCOUNTER — Other Ambulatory Visit: Payer: Self-pay | Admitting: *Deleted

## 2019-03-30 DIAGNOSIS — R000 Tachycardia, unspecified: Secondary | ICD-10-CM

## 2019-03-30 DIAGNOSIS — I4510 Unspecified right bundle-branch block: Secondary | ICD-10-CM | POA: Diagnosis not present

## 2019-03-30 DIAGNOSIS — R931 Abnormal findings on diagnostic imaging of heart and coronary circulation: Secondary | ICD-10-CM

## 2019-03-30 DIAGNOSIS — I081 Rheumatic disorders of both mitral and tricuspid valves: Secondary | ICD-10-CM | POA: Insufficient documentation

## 2019-03-30 NOTE — Progress Notes (Signed)
Mr. Raymond Hurley presented for echocardiogram today referred by Dr. Lajean Manes for tachycardia. Echo showed low EF (25-30%). DOD (Dr. Curt Bears) was notified of the findings and he recommended an official cardiology consult. Raymond Hammans, RN will be contacting him with appointment time. Mr. Raymond Hurley is stable to be discharged home.

## 2019-04-05 DIAGNOSIS — L821 Other seborrheic keratosis: Secondary | ICD-10-CM | POA: Diagnosis not present

## 2019-04-05 DIAGNOSIS — L812 Freckles: Secondary | ICD-10-CM | POA: Diagnosis not present

## 2019-04-05 DIAGNOSIS — D485 Neoplasm of uncertain behavior of skin: Secondary | ICD-10-CM | POA: Diagnosis not present

## 2019-04-05 DIAGNOSIS — D1801 Hemangioma of skin and subcutaneous tissue: Secondary | ICD-10-CM | POA: Diagnosis not present

## 2019-04-05 DIAGNOSIS — Z85820 Personal history of malignant melanoma of skin: Secondary | ICD-10-CM | POA: Diagnosis not present

## 2019-04-05 DIAGNOSIS — L570 Actinic keratosis: Secondary | ICD-10-CM | POA: Diagnosis not present

## 2019-04-05 DIAGNOSIS — Z85828 Personal history of other malignant neoplasm of skin: Secondary | ICD-10-CM | POA: Diagnosis not present

## 2019-04-05 DIAGNOSIS — C44629 Squamous cell carcinoma of skin of left upper limb, including shoulder: Secondary | ICD-10-CM | POA: Diagnosis not present

## 2019-04-08 ENCOUNTER — Telehealth: Payer: Self-pay | Admitting: Internal Medicine

## 2019-04-08 NOTE — Telephone Encounter (Signed)

## 2019-04-09 ENCOUNTER — Ambulatory Visit (INDEPENDENT_AMBULATORY_CARE_PROVIDER_SITE_OTHER): Payer: Medicare Other | Admitting: Internal Medicine

## 2019-04-09 ENCOUNTER — Other Ambulatory Visit: Payer: Self-pay

## 2019-04-09 VITALS — BP 113/74 | HR 121 | Temp 97.3°F | Ht 70.0 in | Wt 207.0 lb

## 2019-04-09 DIAGNOSIS — R931 Abnormal findings on diagnostic imaging of heart and coronary circulation: Secondary | ICD-10-CM

## 2019-04-09 DIAGNOSIS — I5041 Acute combined systolic (congestive) and diastolic (congestive) heart failure: Secondary | ICD-10-CM

## 2019-04-09 DIAGNOSIS — R000 Tachycardia, unspecified: Secondary | ICD-10-CM | POA: Diagnosis not present

## 2019-04-09 DIAGNOSIS — R9430 Abnormal result of cardiovascular function study, unspecified: Secondary | ICD-10-CM

## 2019-04-09 MED ORDER — ENTRESTO 24-26 MG PO TABS: 1.0000 | ORAL_TABLET | Freq: Two times a day (BID) | ORAL | 0 refills | Status: DC

## 2019-04-09 MED ORDER — ENTRESTO 24-26 MG PO TABS: 1.0000 | ORAL_TABLET | Freq: Two times a day (BID) | ORAL | 6 refills | Status: DC

## 2019-04-09 NOTE — Patient Instructions (Addendum)
Medication Instructions:  START ENTRESTO 24/26 ONE TABLET TWICE A DAY ( TRY TO TAKE @ 12 HOURS IF POSSIBLE  If you need a refill on your cardiac medications before your next appointment, please call your pharmacy.   Lab work:  NOT NEEDED    Testing/Procedures: NOT NEEDED  Follow-Up: At [facility], you and your health needs are our priority.  As part of our continuing mission to provide you with exceptional heart care, we have created designated Provider Care Teams.  These Care Teams include your primary Cardiologist (physician) and Advanced Practice Providers (APPs -  Physician Assistants and Nurse Practitioners) who all work together to provide you with the care you need, when you need it. . You will need a follow up appointment on July 8,2020 At 3:40 pm  Any Other Special Instructions Will Be Listed Below (If Applicable).

## 2019-04-09 NOTE — Progress Notes (Signed)
Cardiology Office Note:    Date:  04/08/2019   ID:  Raymond Hurley., DOB [birth year], MRN [id number]  PCP:  Lajean Manes, MD  Cardiologist:  No primary care provider on file.  Electrophysiologist:  None   Referring MD: Lajean Manes, MD   Chief Complaint: New dx of reduced EF and tachycardia  History of Present Illness:    Raymond Hurley. is a 85 y.o. male with a hx of metastatic melanoma, B12 deficiency, and right bundle branch block with left anterior fascicular block who has been found to have a newly reduced ejection fraction and tachycardia.   He was recently seen by his oncologist as well as his primary care physician who noted tachycardia which was felt to be sinus tachycardia.  For this an echocardiogram was ordered demonstrating a newly reduced ejection fraction.  The patient has no knowledge of an inciting event that may have led to his reduced ejection fraction.  He does however state that over the past several months he has noticed an increase in his dyspnea on exertion and fatigue.  He does also note occasional lightheadedness.  He denies PND or orthopnea, and denies significant leg swelling.  His ejection fraction on echocardiogram was found to be 25-30%.  Though there is no imaging available for comparison, an echocardiogram was mentioned in the medical record from 2017 which notes a preserved ejection fraction.  He has no known history of coronary artery disease.  With his history of melanoma he has not received conventional chemotherapy or radiation.  He has received Keytruda and other immunologic therapy.  He denies chest pain or chest pressure.  Denies significant palpitations.  Denies syncope or presyncope.  Past Medical History:  Diagnosis Date  . Melanoma ([place])   . Vitamin B 12 deficiency     History reviewed. No pertinent surgical history.  Current Medications: Current Meds  Medication Sig  . Cyanocobalamin (B-12) 500 MCG TABS Take by mouth.  .  Thiamine HCl (B-1 PO) Take by mouth.     Allergies:   Povidone-iodine, Tape, and Bee venom   Social History   Socioeconomic History  . Marital status: Married    Spouse name: Not on file  . Number of children: Not on file  . Years of education: Not on file  . Highest education level: Not on file  Occupational History  . Not on file  Social Needs  . Financial resource strain: Not on file  . Food insecurity    Worry: Not on file    Inability: Not on file  . Transportation needs    Medical: Not on file    Non-medical: Not on file  Tobacco Use  . Smoking status: Not on file  Substance and Sexual Activity  . Alcohol use: Not on file  . Drug use: Not on file  . Sexual activity: Not on file  Lifestyle  . Physical activity    Days per week: Not on file    Minutes per session: Not on file  . Stress: Not on file  Relationships  . Social Herbalist on phone: Not on file    Gets together: Not on file    Attends religious service: Not on file    Active member of club or organization: Not on file    Attends meetings of clubs or organizations: Not on file    Relationship status: Not on file  Other Topics Concern  . Not on file  Social  History Narrative  . Not on file     Family History: The patient's is negative for sudden cardiac death, early MI, or cardiomyopathy. ROS:   Please see the history of present illness.    All other systems reviewed and are negative.  EKGs/Labs/Other Studies Reviewed:    The following studies were reviewed today:  EKG: Sinus tachycardia rate 121, right bundle branch block, left anterior fascicular block, probable LVH meeting criteria in aVL.  Recent Labs: No results found for requested labs within last 8760 hours.  Recent Lipid Panel No results found for: CHOL, TRIG, HDL, CHOLHDL, VLDL, LDLCALC, LDLDIRECT  Physical Exam:    VS:  BP 113/74   Pulse (!) 121   Temp (!) 97.3 F (36.3 C)   Ht 5\' 10"  (1.778 m)   Wt 207 lb (93.9  kg)   SpO2 99%   BMI 29.70 kg/m     Wt Readings from Last 5 Encounters:  04/09/19 207 lb (93.9 kg)     Constitutional: No acute distress Eyes: sclera non-icteric, normal conjunctiva and lids ENMT: normal dentition, moist mucous membranes Cardiovascular: regular rhythm, normal rate, no murmurs. S1 and S2 normal. Radial pulses normal bilaterally. No jugular venous distention.  Respiratory: clear to auscultation bilaterally GI : normal bowel sounds, soft and nontender. No distention.   MSK: extremities warm, well perfused. No edema.  NEURO: grossly nonfocal exam, moves all extremities. PSYCH: alert and oriented x 3, normal mood and affect.   ASSESSMENT:    1. Acute combined systolic and diastolic heart failure ([place])   2. Sinus tachycardia   3. Cardiac LV ejection fraction 21-30%   4. Abnormal echocardiogram    PLAN:    He has newly recognized acute combined systolic and diastolic heart failure.  It appears he has at least NYHA class II symptoms, probable class III symptoms at times.  I have discussed in great detail the etiology and natural history of systolic heart failure and the work-up required to determine the cause.  This will require repeated review as the patient believes it may be due to being overweight and compressing a vein in his abdomen.  In addition he feels that it may be due to his position and does not clearly understand why he is tachycardic when lying flat.  I have explained that in systolic heart failure myocardial function is abnormal and tachycardia is often a result of attempting to compensate for cardiac output.  I explained to the patient that ruling out ischemic causes which can often be reversible is usually the first step.  I explained in great detail cardiac catheterization and have provided informed consent information today.  Patient is reluctant to proceed with this and does not quite understand why this is necessary.  I also would like alternative  testing, however with his tachycardia I believe a cardiac MRI and a cardiac CT would not be of diagnostic quality.  He will bring his wife with him to an appointment next week where they can both be present for the discussion of coronary angiogram.  I would like to perform a right and left heart catheterization on the patient given his symptoms of dyspnea on exertion and tachycardia as I think his cardiac index would probably be quite low and decisions can be made while in hospital as to any inpatient therapy that may be necessary.  In the meantime we will initiate heart failure therapy.  I am concerned that he is relying on the tachycardia for cardiac output,  and initiation of beta-blockade may not be the best.  Despite his appearance of being relatively well compensated.  We will start with Entresto 24-26 mg and monitor for any side effects when he returns with his wife next week.  If he appears slightly better compensated with his tachycardia is less prominent, initiation of beta-blocker at that time would be very reasonable.  I have confirmed with the patient that he is interested in pursuing therapy for heart failure and is seeking myocardial recovery.  TIME SPENT WITH PATIENT: 60 minutes of direct patient care. More than 50% of that time was spent on coordination of care and counseling regarding heart failure therapy, natural history, and etiologies, as well as workup.  Cherlynn Kaiser, MD [place]  [facility]   Medication Adjustments/Labs and Tests Ordered: Current medicines are reviewed at length with the patient today.  Concerns regarding medicines are outlined above.  Orders Placed This Encounter  Procedures  . EKG 12-Lead   Meds ordered this encounter  Medications  . DISCONTD: sacubitril-valsartan (ENTRESTO) 24-26 MG    Sig: Take 1 tablet by mouth 2 (two) times daily.    Dispense:  60 tablet    Refill:  6  . sacubitril-valsartan (ENTRESTO) 24-26 MG    Sig: Take 1 tablet  by mouth 2 (two) times daily.    Dispense:  28 tablet    Refill:  0    Lot [id number] exp 04/2021 x1    Patient Instructions  Medication Instructions:  START ENTRESTO 24/26 ONE TABLET TWICE A DAY ( TRY TO TAKE @ 12 HOURS IF POSSIBLE  If you need a refill on your cardiac medications before your next appointment, please call your pharmacy.   Lab work:  NOT NEEDED    Testing/Procedures: NOT NEEDED  Follow-Up: At [facility], you and your health needs are our priority.  As part of our continuing mission to provide you with exceptional heart care, we have created designated Provider Care Teams.  These Care Teams include your primary Cardiologist (physician) and Advanced Practice Providers (APPs -  Physician Assistants and Nurse Practitioners) who all work together to provide you with the care you need, when you need it. . You will need a follow up appointment on July 8,2020 At 3:40 pm  Any Other Special Instructions Will Be Listed Below (If Applicable).

## 2019-04-13 ENCOUNTER — Encounter: Payer: Self-pay | Admitting: Internal Medicine

## 2019-04-13 NOTE — Progress Notes (Signed)
04/09/2019  

## 2019-04-14 DIAGNOSIS — I2510 Atherosclerotic heart disease of native coronary artery without angina pectoris: Secondary | ICD-10-CM

## 2019-04-14 HISTORY — DX: Atherosclerotic heart disease of native coronary artery without angina pectoris: I25.10

## 2019-04-20 ENCOUNTER — Telehealth: Payer: Self-pay | Admitting: *Deleted

## 2019-04-20 NOTE — Telephone Encounter (Signed)
Spoke to patient-  Asked if patient can come to aoffice 3:20 tomorrow instead for 3:40 pm Patient stated he could with wife       Primary Cardiologist: No primary care provider on file.   Pt contacted.  History and symptoms reviewed.  Pt will f/u with [facility] provider as scheduled.  Pt. advised that we are restricting visitors at this time and request that only patients present for check-in prior to their appointment.  All other visitors should remain in their car.  If necessary, only one visitor may come with the patient, into the building. For everyone's safety, all patients and visitor entering our practice area should expect to be screened again prior to entering our waiting area. wEar a face covering or mask. Patient wife will have to assist him in  Discussion process- she can come to appt  Raiford Simmonds, RN  04/20/2019 3:12 PM

## 2019-04-21 ENCOUNTER — Ambulatory Visit (INDEPENDENT_AMBULATORY_CARE_PROVIDER_SITE_OTHER): Payer: Medicare Other | Admitting: Internal Medicine

## 2019-04-21 ENCOUNTER — Encounter: Payer: Self-pay | Admitting: Internal Medicine

## 2019-04-21 ENCOUNTER — Other Ambulatory Visit: Payer: Self-pay | Admitting: *Deleted

## 2019-04-21 ENCOUNTER — Other Ambulatory Visit: Payer: Self-pay

## 2019-04-21 VITALS — BP 110/80 | HR 122 | Ht 70.0 in | Wt 207.0 lb

## 2019-04-21 DIAGNOSIS — R000 Tachycardia, unspecified: Secondary | ICD-10-CM

## 2019-04-21 DIAGNOSIS — I5041 Acute combined systolic (congestive) and diastolic (congestive) heart failure: Secondary | ICD-10-CM

## 2019-04-21 DIAGNOSIS — Z01812 Encounter for preprocedural laboratory examination: Secondary | ICD-10-CM | POA: Diagnosis not present

## 2019-04-21 DIAGNOSIS — R931 Abnormal findings on diagnostic imaging of heart and coronary circulation: Secondary | ICD-10-CM

## 2019-04-21 DIAGNOSIS — R9430 Abnormal result of cardiovascular function study, unspecified: Secondary | ICD-10-CM

## 2019-04-21 NOTE — Patient Instructions (Addendum)
Medication Instructions:  Your physician recommends that you continue on your current medications as directed. Please refer to the Current Medication list given to you today.  If you need a refill on your cardiac medications before your next appointment, please call your pharmacy.   Lab work: BMET/CBC AT THE OFFICE 04/23/19 PRIOR TO YOUR COVID TEST   COVID TEST 7/10 2:40 PM AT [facility]  YOU WILL NEED TO QUARANTINE YOURSELF AT [facility] TEST UNTIL AFTER PROCEDURE 7/14  If you have labs (blood work) drawn today and your tests are completely normal, you will receive your results only by: [facility] MyChart Message (if you have MyChart) OR . A paper copy in the mail If you have any lab test that is abnormal or we need to change your treatment, we will call you to review the results.  Testing/Procedures: Your physician has requested that you have a cardiac catheterization. Cardiac catheterization is used to diagnose and/or treat various heart conditions. Doctors may recommend this procedure for a number of different reasons. The most common reason is to evaluate chest pain. Chest pain can be a symptom of coronary artery disease (CAD), and cardiac catheterization can show whether plaque is narrowing or blocking your heart's arteries. This procedure is also used to evaluate the valves, as well as measure the blood flow and oxygen levels in different parts of your heart. For further information please visit [email or web address]. Please follow instruction sheet, as given.  Follow-Up:  WILL GET SCHEDULED AFTER CATH   Any Other Special Instructions Will Be Listed Below (If Applicable).     [address] [place] [address] Dept: [phone] Loc: [phone]  Antonino Nienhuis.  04/21/2019  You are scheduled for a Cardiac Catheterization on Tuesday,  July 14 with Dr. Glori Bickers.  1. Please arrive at the [facility] (Main Entrance A) at [facility]: [address] [place], [place] [address] at 11:30 AM (This time is two hours before your procedure to ensure your preparation). Free valet parking service is available.   Special note: Every effort is made to have your procedure done on time. Please understand that emergencies sometimes delay scheduled procedures.  2. Diet: Do not eat solid foods after midnight.  The patient may have clear liquids until 5am upon the day of the procedure.  3. Labs: You will need to have blood drawn on Friday, July 10 at [place]  Open: 8am - 5pm (Lunch 12:30 - 1:30)   Phone: [phone]. You do not need to be fasting.  4. Medication instructions in preparation for your procedure:  DO NOT TAKE ENTRESTO DAY BEFORE OR MORNING OF PROCEDURE   Contrast Allergy: No  On the morning of your procedure take any morning medicines NOT listed above.  You may use sips of water.  5. Plan for one night stay--bring personal belongings. 6. Bring a current list of your medications and current insurance cards. 7. You MUST have a responsible person to drive you home. 8. Someone MUST be with you the first 24 hours after you arrive home or your discharge will be delayed. 9. Please wear clothes that are easy to get on and off and wear slip-on shoes.  Thank you for allowing [place] to care for you!   -- [facility]   2

## 2019-04-23 ENCOUNTER — Other Ambulatory Visit (HOSPITAL_COMMUNITY)
Admission: RE | Admit: 2019-04-23 | Discharge: 2019-04-23 | Disposition: A | Discharge status: Home / Self Care | Payer: Medicare Other | Source: Ambulatory Visit | Attending: Internal Medicine | Admitting: Internal Medicine

## 2019-04-23 DIAGNOSIS — Z1159 Encounter for screening for other viral diseases: Secondary | ICD-10-CM | POA: Diagnosis not present

## 2019-04-23 DIAGNOSIS — Z01812 Encounter for preprocedural laboratory examination: Secondary | ICD-10-CM | POA: Insufficient documentation

## 2019-04-23 DIAGNOSIS — R931 Abnormal findings on diagnostic imaging of heart and coronary circulation: Secondary | ICD-10-CM | POA: Diagnosis not present

## 2019-04-23 LAB — BASIC METABOLIC PANEL
BUN/Creatinine Ratio: 13 (ref 10–24)
BUN: 18 mg/dL (ref 8–27)
CO2: 24 mmol/L (ref 20–29)
Calcium: 8.7 mg/dL (ref 8.6–10.2)
Chloride: 104 mmol/L (ref 96–106)
Creatinine, Ser: 1.43 mg/dL — ABNORMAL HIGH (ref 0.76–1.27)
GFR calc Af Amer: 51 mL/min/{1.73_m2} — ABNORMAL LOW (ref >59)
GFR calc non Af Amer: 44 mL/min/{1.73_m2} — ABNORMAL LOW (ref >59)
Glucose: 144 mg/dL — ABNORMAL HIGH (ref 65–99)
Potassium: 5 mmol/L (ref 3.5–5.2)
Sodium: 141 mmol/L (ref 134–144)

## 2019-04-23 LAB — CBC WITH DIFFERENTIAL/PLATELET
Basophils Absolute: 0 10*3/uL (ref 0.0–0.2)
Basos: 1 %
EOS (ABSOLUTE): 0.1 10*3/uL (ref 0.0–0.4)
Eos: 3 %
Hematocrit: 45.8 % (ref 37.5–51.0)
Hemoglobin: 15.8 g/dL (ref 13.0–17.7)
Immature Grans (Abs): 0 10*3/uL (ref 0.0–0.1)
Immature Granulocytes: 0 %
Lymphocytes Absolute: 1.1 10*3/uL (ref 0.7–3.1)
Lymphs: 27 %
MCH: 34.1 pg — ABNORMAL HIGH (ref 26.6–33.0)
MCHC: 34.5 g/dL (ref 31.5–35.7)
MCV: 99 fL — ABNORMAL HIGH (ref 79–97)
Monocytes Absolute: 0.6 10*3/uL (ref 0.1–0.9)
Monocytes: 15 %
Neutrophils Absolute: 2.3 10*3/uL (ref 1.4–7.0)
Neutrophils: 54 %
Platelets: 207 10*3/uL (ref 150–450)
RBC: 4.63 x10E6/uL (ref 4.14–5.80)
RDW: 12.4 % (ref 11.6–15.4)
WBC: 4.2 10*3/uL (ref 3.4–10.8)

## 2019-04-24 LAB — SARS CORONAVIRUS 2 (TAT 6-24 HRS): SARS Coronavirus 2: NEGATIVE

## 2019-04-26 ENCOUNTER — Telehealth: Payer: Self-pay | Admitting: *Deleted

## 2019-04-26 NOTE — H&P (View-Only) (Signed)
Cardiology Office Note:    Date:  04/21/2019   ID:  Raymond Hurley., DOB 19-May-1931, MRN [id number]  PCP:  Raymond Manes, MD  Cardiologist:  No primary care provider on file.  Electrophysiologist:  None   Referring MD: Raymond Manes, MD   Chief Complaint: HFrEF  History of Present Illness:    Raymond Hurley. is a 85 y.o. male with a hx of metastatic melanoma, B12 deficiency, and right bundle branch block with left anterior fascicular block who has been found to have a newly reduced ejection fraction and tachycardia.   CURRENT PRESENTATION: Since our visit 10 days ago, he feels he has continued shortness of breath and feels mildly congested in his chest. No significant LE edema. No CP. He has DOE. No significant palpitations. Does not endorse PND, orthopnea.   He comes in today with his wife, having not started the [place] as we had discussed previously. He has many questions about medications, indications and why he has heart failure. We discussed again that we will need to rule out severe CAD, and if no significant CAD is seen, we will pursue non-ischemic workup. His heart failure is fully compensated it appears with tachycardia and DOE, and this will complicate a noninvasive workup. We discussed R/LHC today.  PREVIOUS HISTORY: He was recently seen by his oncologist as well as his primary care physician who noted tachycardia which was felt to be sinus tachycardia.  For this an echocardiogram was ordered demonstrating a newly reduced ejection fraction.  The patient has no knowledge of an inciting event that may have led to his reduced ejection fraction.  He does however state that over the past several months he has noticed an increase in his dyspnea on exertion and fatigue.  He does also note occasional lightheadedness.  He denies PND or orthopnea, and denies significant leg swelling. His ejection fraction on echocardiogram was found to be 25-30%.  Though there is no imaging  available for comparison, an echocardiogram was mentioned in the medical record from 2017 which notes a preserved ejection fraction.  He has no known history of coronary artery disease.  With his history of melanoma he has not received conventional chemotherapy or radiation.  He has received Keytruda and other immunologic therapy. He denies chest pain or chest pressure.  Denies significant palpitations.  Denies syncope or presyncope.   Past Medical History:  Diagnosis Date   Melanoma ([place])    Vitamin B 12 deficiency     History reviewed. No pertinent surgical history.  Current Medications: Current Meds  Medication Sig   [DISCONTINUED] Cyanocobalamin (B-12) 500 MCG TABS Take by mouth.   [DISCONTINUED] Thiamine HCl (B-1 PO) Take by mouth.     Allergies:   Povidone-iodine, Tape, and Bee venom   Social History   Socioeconomic History   Marital status: Married    Spouse name: Not on file   Number of children: Not on file   Years of education: Not on file   Highest education level: Not on file  Occupational History   Not on file  Social Needs   Financial resource strain: Not on file   Food insecurity    Worry: Not on file    Inability: Not on file   Transportation needs    Medical: Not on file    Non-medical: Not on file  Tobacco Use   Smoking status: Never Smoker   Smokeless tobacco: Never Used  Substance and Sexual Activity   Alcohol use:  Not on file   Drug use: Not on file   Sexual activity: Not on file  Lifestyle   Physical activity    Days per week: Not on file    Minutes per session: Not on file   Stress: Not on file  Relationships   Social connections    Talks on phone: Not on file    Gets together: Not on file    Attends religious service: Not on file    Active member of club or organization: Not on file    Attends meetings of clubs or organizations: Not on file    Relationship status: Not on file  Other Topics Concern   Not on file    Social History Narrative   Not on file     Family History: The patient's family history is negative for cardiomyopathy.   ROS:   Please see the history of present illness.    All other systems reviewed and are negative.  EKGs/Labs/Other Studies Reviewed:    The following studies were reviewed today:  EKG:  Sinus tach, possible atrial tachycardia    Recent Labs: 04/23/2019: BUN 18; Creatinine, Ser 1.43; Hemoglobin 15.8; Platelets 207; Potassium 5.0; Sodium 141  Recent Lipid Panel No results found for: CHOL, TRIG, HDL, CHOLHDL, VLDL, LDLCALC, LDLDIRECT  Physical Exam:    VS:  BP 110/80    Pulse (!) 122    Ht 5\' 10"  (1.778 m)    Wt 207 lb (93.9 kg)    SpO2 97%    BMI 29.70 kg/m     Wt Readings from Last 5 Encounters:  04/21/19 207 lb (93.9 kg)  04/09/19 207 lb (93.9 kg)     Constitutional: No acute distress Eyes: sclera non-icteric, normal conjunctiva and lids ENMT: normal dentition, moist mucous membranes Cardiovascular: regular rhythm, tachycardic rate, no murmurs. S1 and S2 normal. Radial pulses normal bilaterally. No jugular venous distention.  Respiratory: clear to auscultation bilaterally GI : normal bowel sounds, soft and nontender. No distention.   MSK: extremities warm, well perfused. No edema.  NEURO: grossly nonfocal exam, moves all extremities. PSYCH: alert and oriented x 3, normal mood and affect.      ASSESSMENT:    1. Acute combined systolic and diastolic heart failure (HCC)   2. Cardiac LV ejection fraction 21-30%   3. Pre-procedure lab exam   4. Sinus tachycardia   5. Abnormal echocardiogram    PLAN:    We will need further information to work up his reduced ejection fraction. We discussed R/LHC today, we will coordinate this and are fortunate that Dr. Haroldine Hurley will be available for this procedure.   We reviewed risks, benefits and alternatives and informed consent was obtained.   I would like for the patient to start [facility] for HF  therapy. Ideally we need to start lasix and metoprolol as well. The patient has many questions about medication therapy, and I have described mechanism of action of each medication in depth to the best of my knowledge. He has a lower baseline blood pressure, so titration of medications may be challenging. We discussed together what strategy to start with. He is hesitant to start any therapy. He asks if he should pursue his cardiovascular care at [facility] because of trial therapies for cancer he is receiving, and I have encouraged him to make whatever decision he feels most comfortable with. His wife shares with me he has not been on any trial medications in several years, and she would like for  him to continue his care with our practice.   INFORMED CONSENT: I have reviewed the risks, indications, and alternatives to cardiac catheterization, possible angioplasty, and stenting with the patient. Risks include but are not limited to bleeding, infection, vascular injury, stroke, myocardial infection, arrhythmia, kidney injury, radiation-related injury in the case of prolonged fluoroscopy use, emergency cardiac surgery, and death. The patient understands the risks of serious complication is 1-2 in 1761 with diagnostic cardiac cath and 1-2% or less with angioplasty/stenting. Risks for RHC include pneumothorax, vessel injury at IJ, and potential arrhythmia.   TIME SPENT WITH PATIENT: 60  minutes of direct patient care. More than 50% of that time was spent on coordination of care and counseling regarding workup of HFrEF, cath consent, and medical therapy.  Cherlynn Kaiser, MD [place]   [facility]   Medication Adjustments/Labs and Tests Ordered: Current medicines are reviewed at length with the patient today.  Concerns regarding medicines are outlined above.  Orders Placed This Encounter  Procedures   CBC with Differential/Platelet   Basic metabolic panel   EKG 60-VPXT   No orders of the  defined types were placed in this encounter.   Patient Instructions  Medication Instructions:  Your physician recommends that you continue on your current medications as directed. Please refer to the Current Medication list given to you today.  If you need a refill on your cardiac medications before your next appointment, please call your pharmacy.   Lab work: BMET/CBC AT THE OFFICE 04/23/19 PRIOR TO YOUR COVID TEST   COVID TEST 7/10 2:40 PM AT [place]  YOU WILL NEED TO QUARANTINE YOURSELF AT [place] TEST UNTIL AFTER PROCEDURE 7/14  If you have labs (blood work) drawn today and your tests are completely normal, you will receive your results only by:  [place] (if you have MyChart) OR  A paper copy in the mail If you have any lab test that is abnormal or we need to change your treatment, we will call you to review the results.  Testing/Procedures: Your physician has requested that you have a cardiac catheterization. Cardiac catheterization is used to diagnose and/or treat various heart conditions. Doctors may recommend this procedure for a number of different reasons. The most common reason is to evaluate chest pain. Chest pain can be a symptom of coronary artery disease (CAD), and cardiac catheterization can show whether plaque is narrowing or blocking your hearts arteries. This procedure is also used to evaluate the valves, as well as measure the blood flow and oxygen levels in different parts of your heart. For further information please visit HugeFiesta.tn. Please follow instruction sheet, as given.  Follow-Up:  WILL GET SCHEDULED AFTER CATH   Any Other Special Instructions Will Be Listed Below (If Applicable).     [place] [employer] [place] [address] Dept: [phone] Loc: [phone]  Raymond Hurley.  04/21/2019  You are scheduled for a Cardiac Catheterization on Tuesday, July 14 with Dr. Glori Bickers.  1. Please arrive at the [facility] (Main Entrance A) at [facility]: [address] [place], [place] [address] at 11:30 AM (This time is two hours before your procedure to ensure your preparation). Free valet parking service is available.   Special note: Every effort is made to have your procedure done on time. Please understand that emergencies sometimes delay scheduled procedures.  2.  Diet: Do not eat solid foods after midnight.  The patient may have clear liquids until 5am upon the day of the procedure.  3. Labs: You will need to have blood drawn on Friday, July 10 at [place]  Open: 8am - 5pm (Lunch 12:30 - 1:30)   Phone: [phone]. You do not need to be fasting.  4. Medication instructions in preparation for your procedure:  DO NOT TAKE ENTRESTO DAY BEFORE OR MORNING OF PROCEDURE   Contrast Allergy: No  On the morning of your procedure take any morning medicines NOT listed above.  You may use sips of water.  5. Plan for one night stay--bring personal belongings. 6. Bring a current list of your medications and current insurance cards. 7. You MUST have a responsible person to drive you home. 8. Someone MUST be with you the first 24 hours after you arrive home or your discharge will be delayed. 9. Please wear clothes that are easy to get on and off and wear slip-on shoes.  Thank you for allowing [place] to care for you!   -- [place] [facility]   2

## 2019-04-26 NOTE — Telephone Encounter (Signed)
Pt contacted pre-catheterization scheduled at [facility] for: Tuesday April 27, 2019 1:30 PM Verified arrival time and place: [place] Entrance A at: 11:30 AM 04/23/19 GFR 44-per Dr Theresa Duty pre procedure hydration.  No solid food after midnight prior to cath, clear liquids until 5 AM day of procedure. Contrast allergy: no  Hold: Entresto-day before and morning of procedure. Pt forgot and took AM dose 04/26/19, instructed to hold now until after procedure.  Except hold medications AM meds can be  taken pre-cath with sip of water including: ASA 81 mg   Confirmed patient has responsible person to drive home post procedure and observe 24 hours after arriving home: yes  Pt advised [place] requires patients to wear mask when they enter hospital.  Pt advised due to Covid-19 pandemic, no visitors are allowed in the hospital at this time. Their designated party will be called with an update after procedure.      COVID-19 Pre-Screening Questions:  . In the past 7 to 10 days have you had a cough,  shortness of breath, headache, congestion, fever (100 or greater) body aches, chills, sore throat, or sudden loss of taste or sense of smell? no . Have you been around anyone with known Covid 19? no . Have you been around anyone who is awaiting Covid 19 test results in the past 7 to 10 days? no . Have you been around anyone who has been exposed to Covid 19, or has mentioned symptoms of Covid 19 within the past 7 to 10 days? no  I reviewed procedure instructions, mask/visitor/ Covid-19 screening questions with patient, he verbalized understanding, thanked me for call.

## 2019-04-26 NOTE — Progress Notes (Signed)
Cardiology Office Note:    Date:  04/21/2019   ID:  Raymond Hurley., DOB 10/24/1930, MRN [id number]  PCP:  Lajean Manes, MD  Cardiologist:  No primary care provider on file.  Electrophysiologist:  None   Referring MD: Lajean Manes, MD   Chief Complaint: HFrEF  History of Present Illness:    Raymond Hurley. is a 85 y.o. male with a hx of metastatic melanoma, B12 deficiency, and right bundle branch block with left anterior fascicular block who has been found to have a newly reduced ejection fraction and tachycardia.   CURRENT PRESENTATION: Since our visit 10 days ago, he feels he has continued shortness of breath and feels mildly congested in his chest. No significant LE edema. No CP. He has DOE. No significant palpitations. Does not endorse PND, orthopnea.   He comes in today with his wife, having not started the [place] as we had discussed previously. He has many questions about medications, indications and why he has heart failure. We discussed again that we will need to rule out severe CAD, and if no significant CAD is seen, we will pursue non-ischemic workup. His heart failure is fully compensated it appears with tachycardia and DOE, and this will complicate a noninvasive workup. We discussed R/LHC today.  PREVIOUS HISTORY: He was recently seen by his oncologist as well as his primary care physician who noted tachycardia which was felt to be sinus tachycardia.  For this an echocardiogram was ordered demonstrating a newly reduced ejection fraction.  The patient has no knowledge of an inciting event that may have led to his reduced ejection fraction.  He does however state that over the past several months he has noticed an increase in his dyspnea on exertion and fatigue.  He does also note occasional lightheadedness.  He denies PND or orthopnea, and denies significant leg swelling. His ejection fraction on echocardiogram was found to be 25-30%.  Though there is no imaging  available for comparison, an echocardiogram was mentioned in the medical record from 2017 which notes a preserved ejection fraction.  He has no known history of coronary artery disease.  With his history of melanoma he has not received conventional chemotherapy or radiation.  He has received Keytruda and other immunologic therapy. He denies chest pain or chest pressure.  Denies significant palpitations.  Denies syncope or presyncope.   Past Medical History:  Diagnosis Date   Melanoma ([place])    Vitamin B 12 deficiency     History reviewed. No pertinent surgical history.  Current Medications: Current Meds  Medication Sig   [DISCONTINUED] Cyanocobalamin (B-12) 500 MCG TABS Take by mouth.   [DISCONTINUED] Thiamine HCl (B-1 PO) Take by mouth.     Allergies:   Povidone-iodine, Tape, and Bee venom   Social History   Socioeconomic History   Marital status: Married    Spouse name: Not on file   Number of children: Not on file   Years of education: Not on file   Highest education level: Not on file  Occupational History   Not on file  Social Needs   Financial resource strain: Not on file   Food insecurity    Worry: Not on file    Inability: Not on file   Transportation needs    Medical: Not on file    Non-medical: Not on file  Tobacco Use   Smoking status: Never Smoker   Smokeless tobacco: Never Used  Substance and Sexual Activity   Alcohol use:  Not on file   Drug use: Not on file   Sexual activity: Not on file  Lifestyle   Physical activity    Days per week: Not on file    Minutes per session: Not on file   Stress: Not on file  Relationships   Social connections    Talks on phone: Not on file    Gets together: Not on file    Attends religious service: Not on file    Active member of club or organization: Not on file    Attends meetings of clubs or organizations: Not on file    Relationship status: Not on file  Other Topics Concern   Not on file    Social History Narrative   Not on file     Family History: The patient's family history is negative for cardiomyopathy.   ROS:   Please see the history of present illness.    All other systems reviewed and are negative.  EKGs/Labs/Other Studies Reviewed:    The following studies were reviewed today:  EKG:  Sinus tach, possible atrial tachycardia    Recent Labs: 04/23/2019: BUN 18; Creatinine, Ser 1.43; Hemoglobin 15.8; Platelets 207; Potassium 5.0; Sodium 141  Recent Lipid Panel No results found for: CHOL, TRIG, HDL, CHOLHDL, VLDL, LDLCALC, LDLDIRECT  Physical Exam:    VS:  BP 110/80    Pulse (!) 122    Ht 5\' 10"  (1.778 m)    Wt 207 lb (93.9 kg)    SpO2 97%    BMI 29.70 kg/m     Wt Readings from Last 5 Encounters:  04/21/19 207 lb (93.9 kg)  04/09/19 207 lb (93.9 kg)     Constitutional: No acute distress Eyes: sclera non-icteric, normal conjunctiva and lids ENMT: normal dentition, moist mucous membranes Cardiovascular: regular rhythm, tachycardic rate, no murmurs. S1 and S2 normal. Radial pulses normal bilaterally. No jugular venous distention.  Respiratory: clear to auscultation bilaterally GI : normal bowel sounds, soft and nontender. No distention.   MSK: extremities warm, well perfused. No edema.  NEURO: grossly nonfocal exam, moves all extremities. PSYCH: alert and oriented x 3, normal mood and affect.      ASSESSMENT:    1. Acute combined systolic and diastolic heart failure (HCC)   2. Cardiac LV ejection fraction 21-30%   3. Pre-procedure lab exam   4. Sinus tachycardia   5. Abnormal echocardiogram    PLAN:    We will need further information to work up his reduced ejection fraction. We discussed R/LHC today, we will coordinate this and are fortunate that Dr. Haroldine Laws will be available for this procedure.   We reviewed risks, benefits and alternatives and informed consent was obtained.   I would like for the patient to start [facility] for HF  therapy. Ideally we need to start lasix and metoprolol as well. The patient has many questions about medication therapy, and I have described mechanism of action of each medication in depth to the best of my knowledge. He has a lower baseline blood pressure, so titration of medications may be challenging. We discussed together what strategy to start with. He is hesitant to start any therapy. He asks if he should pursue his cardiovascular care at [facility] because of trial therapies for cancer he is receiving, and I have encouraged him to make whatever decision he feels most comfortable with. His wife shares with me he has not been on any trial medications in several years, and she would like for  him to continue his care with our practice.   INFORMED CONSENT: I have reviewed the risks, indications, and alternatives to cardiac catheterization, possible angioplasty, and stenting with the patient. Risks include but are not limited to bleeding, infection, vascular injury, stroke, myocardial infection, arrhythmia, kidney injury, radiation-related injury in the case of prolonged fluoroscopy use, emergency cardiac surgery, and death. The patient understands the risks of serious complication is 1-2 in 0102 with diagnostic cardiac cath and 1-2% or less with angioplasty/stenting. Risks for RHC include pneumothorax, vessel injury at IJ, and potential arrhythmia.   TIME SPENT WITH PATIENT: 60  minutes of direct patient care. More than 50% of that time was spent on coordination of care and counseling regarding workup of HFrEF, cath consent, and medical therapy.  Cherlynn Kaiser, MD [place]   [facility]   Medication Adjustments/Labs and Tests Ordered: Current medicines are reviewed at length with the patient today.  Concerns regarding medicines are outlined above.  Orders Placed This Encounter  Procedures   CBC with Differential/Platelet   Basic metabolic panel   EKG 72-ZDGU   No orders of the  defined types were placed in this encounter.   Patient Instructions  Medication Instructions:  Your physician recommends that you continue on your current medications as directed. Please refer to the Current Medication list given to you today.  If you need a refill on your cardiac medications before your next appointment, please call your pharmacy.   Lab work: BMET/CBC AT THE OFFICE 04/23/19 PRIOR TO YOUR COVID TEST   COVID TEST 7/10 2:40 PM AT [place] [place]  YOU WILL NEED TO QUARANTINE YOURSELF AT [place] TEST UNTIL AFTER PROCEDURE 7/14  If you have labs (blood work) drawn today and your tests are completely normal, you will receive your results only by:  [place] (if you have MyChart) OR  A paper copy in the mail If you have any lab test that is abnormal or we need to change your treatment, we will call you to review the results.  Testing/Procedures: Your physician has requested that you have a cardiac catheterization. Cardiac catheterization is used to diagnose and/or treat various heart conditions. Doctors may recommend this procedure for a number of different reasons. The most common reason is to evaluate chest pain. Chest pain can be a symptom of coronary artery disease (CAD), and cardiac catheterization can show whether plaque is narrowing or blocking your hearts arteries. This procedure is also used to evaluate the valves, as well as measure the blood flow and oxygen levels in different parts of your heart. For further information please visit HugeFiesta.tn. Please follow instruction sheet, as given.  Follow-Up:  WILL GET SCHEDULED AFTER CATH   Any Other Special Instructions Will Be Listed Below (If Applicable).     [place] [address] Dept: [phone] Loc: [phone]  Elio Haden.  04/21/2019  You are scheduled for a Cardiac Catheterization on Tuesday, July 14 with Dr. Glori Bickers.  1. Please arrive at the [facility] (Main Entrance A) at [facility]: [address] [place], [place] [address] at 11:30 AM (This time is two hours before your procedure to ensure your preparation). Free valet parking service is available.   Special note: Every effort is made to have your procedure done on time. Please understand that emergencies sometimes delay scheduled procedures.  2.  Diet: Do not eat solid foods after midnight.  The patient may have clear liquids until 5am upon the day of the procedure.  3. Labs: You will need to have blood drawn on Friday, July 10 at [place]  Open: 8am - 5pm (Lunch 12:30 - 1:30)   Phone: [phone]. You do not need to be fasting.  4. Medication instructions in preparation for your procedure:  DO NOT TAKE ENTRESTO DAY BEFORE OR MORNING OF PROCEDURE   Contrast Allergy: No  On the morning of your procedure take any morning medicines NOT listed above.  You may use sips of water.  5. Plan for one night stay--bring personal belongings. 6. Bring a current list of your medications and current insurance cards. 7. You MUST have a responsible person to drive you home. 8. Someone MUST be with you the first 24 hours after you arrive home or your discharge will be delayed. 9. Please wear clothes that are easy to get on and off and wear slip-on shoes.  Thank you for allowing [place] to care for you!   -- [place] [facility]   2

## 2019-04-27 ENCOUNTER — Ambulatory Visit (HOSPITAL_COMMUNITY)
Admission: RE | Admit: 2019-04-27 | Discharge: 2019-04-27 | Disposition: A | Discharge status: Home / Self Care | Payer: Medicare Other | Attending: Internal Medicine | Admitting: Internal Medicine

## 2019-04-27 ENCOUNTER — Encounter (HOSPITAL_COMMUNITY)
Admission: RE | Disposition: A | Discharge status: Home / Self Care | Payer: Self-pay | Source: Home / Self Care | Attending: Internal Medicine

## 2019-04-27 ENCOUNTER — Other Ambulatory Visit: Payer: Self-pay

## 2019-04-27 DIAGNOSIS — Z01812 Encounter for preprocedural laboratory examination: Secondary | ICD-10-CM

## 2019-04-27 DIAGNOSIS — I428 Other cardiomyopathies: Secondary | ICD-10-CM | POA: Insufficient documentation

## 2019-04-27 DIAGNOSIS — I2510 Atherosclerotic heart disease of native coronary artery without angina pectoris: Secondary | ICD-10-CM | POA: Insufficient documentation

## 2019-04-27 DIAGNOSIS — I5022 Chronic systolic (congestive) heart failure: Secondary | ICD-10-CM | POA: Diagnosis not present

## 2019-04-27 DIAGNOSIS — Z85820 Personal history of malignant melanoma of skin: Secondary | ICD-10-CM | POA: Diagnosis not present

## 2019-04-27 DIAGNOSIS — Z888 Allergy status to other drugs, medicaments and biological substances status: Secondary | ICD-10-CM | POA: Insufficient documentation

## 2019-04-27 DIAGNOSIS — I5041 Acute combined systolic (congestive) and diastolic (congestive) heart failure: Secondary | ICD-10-CM | POA: Insufficient documentation

## 2019-04-27 DIAGNOSIS — I452 Bifascicular block: Secondary | ICD-10-CM | POA: Insufficient documentation

## 2019-04-27 DIAGNOSIS — E538 Deficiency of other specified B group vitamins: Secondary | ICD-10-CM | POA: Diagnosis not present

## 2019-04-27 DIAGNOSIS — R000 Tachycardia, unspecified: Secondary | ICD-10-CM | POA: Diagnosis not present

## 2019-04-27 HISTORY — PX: RIGHT/LEFT HEART CATH AND CORONARY ANGIOGRAPHY: CATH118266

## 2019-04-27 LAB — POCT I-STAT 7, (LYTES, BLD GAS, ICA,H+H)
Acid-base deficit: 3 mmol/L — ABNORMAL HIGH (ref 0.0–2.0)
Bicarbonate: 22.2 mmol/L (ref 20.0–28.0)
Calcium, Ion: 0.96 mmol/L — ABNORMAL LOW (ref 1.15–1.40)
HCT: 40 % (ref 39.0–52.0)
Hemoglobin: 13.6 g/dL (ref 13.0–17.0)
O2 Saturation: 98 %
Potassium: 3.6 mmol/L (ref 3.5–5.1)
Sodium: 147 mmol/L — ABNORMAL HIGH (ref 135–145)
TCO2: 23 mmol/L (ref 22–32)
pCO2 arterial: 40.8 mmHg (ref 32.0–48.0)
pH, Arterial: 7.345 — ABNORMAL LOW (ref 7.350–7.450)
pO2, Arterial: 112 mmHg — ABNORMAL HIGH (ref 83.0–108.0)

## 2019-04-27 LAB — POCT I-STAT EG7
Acid-base deficit: 2 mmol/L (ref 0.0–2.0)
Acid-base deficit: 8 mmol/L — ABNORMAL HIGH (ref 0.0–2.0)
Bicarbonate: 24.4 mmol/L (ref 20.0–28.0)
Bicarbonate: 18.3 mmol/L — ABNORMAL LOW (ref 20.0–28.0)
Calcium, Ion: 1.13 mmol/L — ABNORMAL LOW (ref 1.15–1.40)
Calcium, Ion: 0.71 mmol/L — CL (ref 1.15–1.40)
HCT: 44 % (ref 39.0–52.0)
HCT: 35 % — ABNORMAL LOW (ref 39.0–52.0)
Hemoglobin: 15 g/dL (ref 13.0–17.0)
Hemoglobin: 11.9 g/dL — ABNORMAL LOW (ref 13.0–17.0)
O2 Saturation: 72 %
O2 Saturation: 73 %
Potassium: 4.1 mmol/L (ref 3.5–5.1)
Potassium: 2.8 mmol/L — ABNORMAL LOW (ref 3.5–5.1)
Sodium: 143 mmol/L (ref 135–145)
Sodium: 148 mmol/L — ABNORMAL HIGH (ref 135–145)
TCO2: 26 mmol/L (ref 22–32)
TCO2: 19 mmol/L — ABNORMAL LOW (ref 22–32)
pCO2, Ven: 47.9 mmHg (ref 44.0–60.0)
pCO2, Ven: 37.9 mmHg — ABNORMAL LOW (ref 44.0–60.0)
pH, Ven: 7.315 (ref 7.250–7.430)
pH, Ven: 7.291 (ref 7.250–7.430)
pO2, Ven: 42 mmHg (ref 32.0–45.0)
pO2, Ven: 43 mmHg (ref 32.0–45.0)

## 2019-04-27 SURGERY — RIGHT/LEFT HEART CATH AND CORONARY ANGIOGRAPHY
Anesthesia: LOCAL

## 2019-04-27 MED ORDER — LIDOCAINE HCL (PF) 1 % IJ SOLN
INTRAMUSCULAR | Status: AC
  Filled 2019-04-27: qty 30

## 2019-04-27 MED ORDER — SODIUM CHLORIDE 0.9 % IV SOLN
INTRAVENOUS | Status: DC
  Administered 2019-04-27: 13:00:00 via INTRAVENOUS

## 2019-04-27 MED ORDER — SODIUM CHLORIDE 0.9% FLUSH: 3.0000 mL | Freq: Two times a day (BID) | INTRAVENOUS | Status: DC

## 2019-04-27 MED ORDER — VERAPAMIL HCL 2.5 MG/ML IV SOLN
INTRAVENOUS | Status: AC
  Filled 2019-04-27: qty 2

## 2019-04-27 MED ORDER — LIDOCAINE HCL (PF) 1 % IJ SOLN
INTRAMUSCULAR | Status: DC | PRN
  Administered 2019-04-27: 2 mL
  Administered 2019-04-27: 3 mL

## 2019-04-27 MED ORDER — ASPIRIN 81 MG PO CHEW: 81.0000 mg | CHEWABLE_TABLET | ORAL | Status: DC

## 2019-04-27 MED ORDER — IOHEXOL 350 MG/ML SOLN
INTRAVENOUS | Status: DC | PRN
  Administered 2019-04-27: 50 mL via INTRA_ARTERIAL

## 2019-04-27 MED ORDER — HEPARIN SODIUM (PORCINE) 1000 UNIT/ML IJ SOLN
INTRAMUSCULAR | Status: DC | PRN
  Administered 2019-04-27: 4000 [IU] via INTRAVENOUS

## 2019-04-27 MED ORDER — HEPARIN (PORCINE) IN NACL 1000-0.9 UT/500ML-% IV SOLN
INTRAVENOUS | Status: AC
  Filled 2019-04-27: qty 1000

## 2019-04-27 MED ORDER — SODIUM CHLORIDE 0.9% FLUSH: 3.0000 mL | INTRAVENOUS | Status: DC | PRN

## 2019-04-27 MED ORDER — MIDAZOLAM HCL 2 MG/2ML IJ SOLN
INTRAMUSCULAR | Status: AC
  Filled 2019-04-27: qty 2

## 2019-04-27 MED ORDER — MIDAZOLAM HCL 2 MG/2ML IJ SOLN
INTRAMUSCULAR | Status: DC | PRN
  Administered 2019-04-27 (×2): 1 mg via INTRAVENOUS

## 2019-04-27 MED ORDER — FENTANYL CITRATE (PF) 100 MCG/2ML IJ SOLN
INTRAMUSCULAR | Status: AC
  Filled 2019-04-27: qty 2

## 2019-04-27 MED ORDER — VERAPAMIL HCL 2.5 MG/ML IV SOLN
INTRAVENOUS | Status: DC | PRN
  Administered 2019-04-27: 10 mL via INTRA_ARTERIAL

## 2019-04-27 MED ORDER — FENTANYL CITRATE (PF) 100 MCG/2ML IJ SOLN
INTRAMUSCULAR | Status: DC | PRN
  Administered 2019-04-27 (×2): 25 ug via INTRAVENOUS

## 2019-04-27 MED ORDER — SODIUM CHLORIDE 0.9 % IV SOLN: 250.0000 mL | INTRAVENOUS | Status: DC | PRN

## 2019-04-27 MED ORDER — HEPARIN (PORCINE) IN NACL 1000-0.9 UT/500ML-% IV SOLN
INTRAVENOUS | Status: DC | PRN
  Administered 2019-04-27 (×2): 500 mL

## 2019-04-27 SURGICAL SUPPLY — 16 items
BAND CMPR LRG ZPHR (HEMOSTASIS) ×1
BAND ZEPHYR COMPRESS 30 LONG (HEMOSTASIS) ×1 IMPLANT
CATH 5FR JL3.5 JR4 ANG PIG MP (CATHETERS) ×1 IMPLANT
CATH BALLN WEDGE 5F 110CM (CATHETERS) ×1 IMPLANT
CATH INFINITI 5FR AL1 (CATHETERS) ×1 IMPLANT
GUIDEWIRE INQWIRE 1.5J.035X260 (WIRE) IMPLANT
INQWIRE 1.5J .035X260CM (WIRE) ×2
KIT HEART LEFT (KITS) ×2 IMPLANT
KIT MICROPUNCTURE NIT STIFF (SHEATH) ×1 IMPLANT
PACK CARDIAC CATHETERIZATION (CUSTOM PROCEDURE TRAY) ×2 IMPLANT
SHEATH PROBE COVER 6X72 (BAG) ×1 IMPLANT
SHEATH RAIN 4/5FR (SHEATH) ×1 IMPLANT
SHEATH RAIN RADIAL 21G 6FR (SHEATH) ×1 IMPLANT
TRANSDUCER W/STOPCOCK (MISCELLANEOUS) ×2 IMPLANT
TUBING CIL FLEX 10 FLL-RA (TUBING) ×2 IMPLANT
WIRE MICROINTRODUCER 60CM (WIRE) ×3 IMPLANT

## 2019-04-27 NOTE — Interval H&P Note (Signed)
History and Physical Interval Note:  04/27/2019 2:26 PM  Raymond Hurley.  has presented today for surgery, with the diagnosis of Heart failure.  The various methods of treatment have been discussed with the patient and family. After consideration of risks, benefits and other options for treatment, the patient has consented to  Procedure(s): RIGHT/LEFT HEART CATH AND CORONARY ANGIOGRAPHY (N/A) and possible coronary angioplasty as a surgical intervention.  The patient's history has been reviewed, patient examined, no change in status, stable for surgery.  I have reviewed the patient's chart and labs.  Questions were answered to the patient's satisfaction.     Kolle Bensimhon

## 2019-04-27 NOTE — Discharge Instructions (Signed)
Radial Site Care ° °This sheet gives you information about how to care for yourself after your procedure. Your health care provider may also give you more specific instructions. If you have problems or questions, contact your health care provider. °What can I expect after the procedure? °After the procedure, it is common to have: °· Bruising and tenderness at the catheter insertion area. °Follow these instructions at home: °Medicines °· Take over-the-counter and prescription medicines only as told by your health care provider. °Insertion site care °· Follow instructions from your health care provider about how to take care of your insertion site. Make sure you: °? Wash your hands with soap and water before you change your bandage (dressing). If soap and water are not available, use hand sanitizer. °? Change your dressing as told by your health care provider. °? Leave stitches (sutures), skin glue, or adhesive strips in place. These skin closures may need to stay in place for 2 weeks or longer. If adhesive strip edges start to loosen and curl up, you may trim the loose edges. Do not remove adhesive strips completely unless your health care provider tells you to do that. °· Check your insertion site every day for signs of infection. Check for: °? Redness, swelling, or pain. °? Fluid or blood. °? Pus or a bad smell. °? Warmth. °· Do not take baths, swim, or use a hot tub until your health care provider approves. °· You may shower 24-48 hours after the procedure, or as directed by your health care provider. °? Remove the dressing and gently wash the site with plain soap and water. °? Pat the area dry with a clean towel. °? Do not rub the site. That could cause bleeding. °· Do not apply powder or lotion to the site. °Activity ° °· For 24 hours after the procedure, or as directed by your health care provider: °? Do not flex or bend the affected arm. °? Do not push or pull heavy objects with the affected arm. °? Do not  drive yourself home from the hospital or clinic. You may drive 24 hours after the procedure unless your health care provider tells you not to. °? Do not operate machinery or power tools. °· Do not lift anything that is heavier than 10 lb (4.5 kg), or the limit that you are told, until your health care provider says that it is safe. °· Ask your health care provider when it is okay to: °? Return to work or school. °? Resume usual physical activities or sports. °? Resume sexual activity. °General instructions °· If the catheter site starts to bleed, raise your arm and put firm pressure on the site. If the bleeding does not stop, get help right away. This is a medical emergency. °· If you went home on the same day as your procedure, a responsible adult should be with you for the first 24 hours after you arrive home. °· Keep all follow-up visits as told by your health care provider. This is important. °Contact a health care provider if: °· You have a fever. °· You have redness, swelling, or yellow drainage around your insertion site. °Get help right away if: °· You have unusual pain at the radial site. °· The catheter insertion area swells very fast. °· The insertion area is bleeding, and the bleeding does not stop when you hold steady pressure on the area. °· Your arm or hand becomes pale, cool, tingly, or numb. °These symptoms may represent a serious problem   that is an emergency. Do not wait to see if the symptoms will go away. Get medical help right away. Call your local emergency services (911 in the [place] Do not drive yourself to the hospital. °Summary °· After the procedure, it is common to have bruising and tenderness at the site. °· Follow instructions from your health care provider about how to take care of your radial site wound. Check the wound every day for signs of infection. °· Do not lift anything that is heavier than 10 lb (4.5 kg), or the limit that you are told, until your health care provider says  that it is safe. °This information is not intended to replace advice given to you by your health care provider. Make sure you discuss any questions you have with your health care provider. °Document Released: 11/02/2010 Document Revised: 11/05/2017 Document Reviewed: 11/05/2017 °Elsevier Patient Education © 2020 [employer] Inc[employer] ° °

## 2019-04-28 ENCOUNTER — Encounter (HOSPITAL_COMMUNITY): Payer: Self-pay | Admitting: Internal Medicine

## 2019-05-12 ENCOUNTER — Other Ambulatory Visit: Payer: Self-pay | Admitting: Cardiovascular Disease

## 2019-05-12 NOTE — Telephone Encounter (Signed)
Follow Up   Patient's wife is calling in to follow up with the exception for the medication Entresto. Please give patient a call back to discuss/assist.

## 2019-05-12 NOTE — Telephone Encounter (Signed)
Phoned and left voice message informing that prior auth paperwork is in process and should be completed within 1 week, may call if further questions arise.   1   Prior Auth Received: Today Message Contents  Reuel Boom, RN  Rhetta Mura, CMA        Prior authorization for [facility] 24-26mg  One tablet BID  Rx #[id number]  [facility] [id number]  Group # [id number]  [id number] Party ID [id number]  [place]...[phone]

## 2019-05-12 NOTE — Telephone Encounter (Signed)
°*  STAT* If patient is at the pharmacy, call can be transferred to refill team.   1. Which medications need to be refilled? (please list name of each medication and dose if known) Entresto 24-26 mg this medication is not under his plan and exception.  2. Which pharmacy/location (including street and city if local pharmacy) is medication to be sent to? [facility]  3. Do they need a 30 day or 90 day supply? [place]

## 2019-05-13 ENCOUNTER — Other Ambulatory Visit: Payer: Self-pay | Admitting: *Deleted

## 2019-05-13 NOTE — Telephone Encounter (Signed)

## 2019-05-20 ENCOUNTER — Other Ambulatory Visit: Payer: Self-pay

## 2019-05-20 ENCOUNTER — Encounter: Payer: Self-pay | Admitting: Internal Medicine

## 2019-05-20 ENCOUNTER — Telehealth: Payer: Self-pay | Admitting: *Deleted

## 2019-05-20 ENCOUNTER — Telehealth (INDEPENDENT_AMBULATORY_CARE_PROVIDER_SITE_OTHER): Payer: Medicare Other | Admitting: Internal Medicine

## 2019-05-20 VITALS — HR 120 | Ht 70.0 in | Wt 200.0 lb

## 2019-05-20 DIAGNOSIS — R931 Abnormal findings on diagnostic imaging of heart and coronary circulation: Secondary | ICD-10-CM

## 2019-05-20 DIAGNOSIS — R9430 Abnormal result of cardiovascular function study, unspecified: Secondary | ICD-10-CM

## 2019-05-20 DIAGNOSIS — R000 Tachycardia, unspecified: Secondary | ICD-10-CM | POA: Diagnosis not present

## 2019-05-20 DIAGNOSIS — I5041 Acute combined systolic (congestive) and diastolic (congestive) heart failure: Secondary | ICD-10-CM

## 2019-05-20 MED ORDER — METOPROLOL SUCCINATE ER 25 MG PO TB24: 25.0000 mg | ORAL_TABLET | Freq: Every day | ORAL | 3 refills | Status: DC

## 2019-05-20 NOTE — Telephone Encounter (Signed)
SPOKE TO PATIENT. INSTRUCTION WAS GIVEN FROM TODAY'S VIRTUAL VISIT.  AVS SUMMARY IS MAILED WITH prescription E-SENT TO PHARMACY . PATIENT AWARE OF UPCOMING APPT AND F/U NEEDED AT HEART FAILURE CLINIC.  PATIENT VERBALIZED UNDERSTANDING.

## 2019-05-20 NOTE — Progress Notes (Signed)
Virtual Visit via Video Note   This visit type was conducted due to national recommendations for restrictions regarding the COVID-19 Pandemic (e.g. social distancing) in an effort to limit this patient's exposure and mitigate transmission in our community.  Due to his co-morbid illnesses, this patient is at least at moderate risk for complications without adequate follow up.  This format is felt to be most appropriate for this patient at this time.  All issues noted in this document were discussed and addressed.  A limited physical exam was performed with this format.  Please refer to the patient's chart for his consent to telehealth for [facility].   Date:  05/20/2019   ID:  [name]., DOB 04/08/1931, MRN [id number]  Patient Location: Home Provider Location: Home  PCP:  Lajean Manes, MD  Cardiologist:  No primary care provider on file.  Electrophysiologist:  None   Evaluation Performed:  Follow-Up Visit  Chief Complaint:  F/u NICM  History of Present Illness:    Raymond Mccarthy. is a 85 y.o. male with hx of metastatic melanoma, B12 deficiency, and right bundle branch block with left anterior fascicular block who has been found to have a newly reduced ejection fraction and tachycardia. His coronary angiogram showed only mild CAD and relatively well compensated right and left heart hemodynamics.  I have explained in great detail our treatment plan, and graduated approach to initiation of medical therapy given his overall medical state and baseline low blood pressure. He and his wife are extremely concerned about his tachycardia, and we have discussed initiation of metoprolol to help with heart failure management, and how this will also address his tachycardia. There is a possibility of atrial arrhythmia contributing, however last ECG appears to show P waves in the precordial leads, suggesting a normal sinus mechanism. Dr. Haroldine Laws has mentioned using Ivabradine, however  the patient previously expressed to me concern over affording Entresto. With that I arranged HF follow up so that the patient may have the opportunity to meet with the HF social work team and may have some assistance affording medications.   The patient and his wife declined their appointment that I had arranged several weeks ago with HF clinic and felt Mr. Catanzaro care should only be coordinated by one physician. I described in great detail the multidisciplinary approach to his care, and involving a specialist and social work in his care to ensure we are providing the highest level of care and resources possible.  The patient and his wife seem very upset about the infrastructure and logistics of the clinic, particularly during COVID-19 pandemic. I have tried to explain our clinic structure to the best of my ability, and our practice model where we split time between the hospital and the office. I explained that this lends itself to different physicians taking responsibility in the office on a daily basis, which may not always be their personal cardiologist. I assured them that our excellent nursing team and cardiologists work collaboratively and triage messages to primary cardiologist-nurse teams as often as possible when those physicians are available. They are frustrated that they are unable to reach physicians directly. I described the MyChart message function allowing [place] to communicate directly if needed.  The patient does not have symptoms concerning for COVID-19 infection (fever, chills, cough, or new shortness of breath).    Past Medical History:  Diagnosis Date   Melanoma ([place])    Vitamin B 12 deficiency    Past Surgical History:  Procedure Laterality Date   RIGHT/LEFT HEART CATH AND CORONARY ANGIOGRAPHY N/A 04/27/2019   Procedure: RIGHT/LEFT HEART CATH AND CORONARY ANGIOGRAPHY;  Surgeon: Jolaine Artist, MD;  Location: [facility];  Service: Cardiovascular;  Laterality: N/A;       Current Meds  Medication Sig   sacubitril-valsartan (ENTRESTO) 24-26 MG Take 1 tablet by mouth 2 (two) times daily.     Allergies:   Povidone-iodine, Tape, and Bee venom   Social History   Tobacco Use   Smoking status: Never Smoker   Smokeless tobacco: Never Used  Substance Use Topics   Alcohol use: Not on file   Drug use: Not on file     Family Hx: The patient's family history is not on file.  ROS:   Please see the history of present illness.     All other systems reviewed and are negative.   Prior CV studies:   The following studies were reviewed today:  Echo, R/LHC  Labs/Other Tests and Data Reviewed:    EKG:  No ECG reviewed.  Recent Labs: 04/23/2019: BUN 18; Creatinine, Ser 1.43; Platelets 207 04/27/2019: Hemoglobin 11.9; Potassium 2.8; Sodium 148   Recent Lipid Panel No results found for: CHOL, TRIG, HDL, CHOLHDL, LDLCALC, LDLDIRECT  Wt Readings from Last 3 Encounters:  05/20/19 200 lb (90.7 kg)  04/27/19 205 lb (93 kg)  04/21/19 207 lb (93.9 kg)     Objective:    Vital Signs:  Pulse (!) 120    Ht 5\' 10"  (1.778 m)    Wt 200 lb (90.7 kg)    BMI 28.70 kg/m    VITAL SIGNS:  reviewed GEN:  no acute distress EYES:  sclerae anicteric, EOMI - Extraocular Movements Intact RESPIRATORY:  normal respiratory effort, symmetric expansion CARDIOVASCULAR:  no peripheral edema SKIN:  no rash, lesions or ulcers. MUSCULOSKELETAL:  no obvious deformities. NEURO:  alert and oriented x 3, no obvious focal deficit PSYCH:  normal affect    ASSESSMENT & PLAN:    1. Acute combined systolic and diastolic heart failure ([place])   2. Sinus tachycardia   3. Cardiac LV ejection fraction 21-30%    Acute combined systolic and diastolic heart failure - we will initiate metoprolol succinate as we had planned since he seems to be tolerating Entresto well.   I will have him follow up closely with one of my physician partners in the office. I have explained to the  patient and his wife that I will be away from the clinic for maternity leave, and will arrange follow up so that they have a consistent contact person while I am away.   Please reschedule HF appt with Dr. Haroldine Laws or Dr. Aundra Dubin, first available. We will prescribe metoprolol succinate 25 mg daily. We will follow up closely to see how he is tolerating this therapy. If well tolerated, we should discuss uptitration of either entresto or metoprolol to highest tolerated doses.   COVID-19 Education: The signs and symptoms of COVID-19 were discussed with the patient and how to seek care for testing (follow up with PCP or arrange E-visit).  The importance of social distancing was discussed today.  Time:   Today, I have spent 60 minutes with the patient with telehealth technology discussing the above problems.     Medication Adjustments/Labs and Tests Ordered: Current medicines are reviewed at length with the patient today.  Concerns regarding medicines are outlined above.   Tests Ordered: No orders of the defined types were placed in this  encounter.   Medication Changes: No orders of the defined types were placed in this encounter.   Follow Up:  Next available MD appt.  Signed, Elouise Munroe, MD  05/20/2019 12:00 PM    [place]

## 2019-05-20 NOTE — Patient Instructions (Addendum)
Medication Instructions:  Metoprolol succinate ( Toprol XL)  25 mg  One tablet  daily  If you need a refill on your cardiac medications before your next appointment, please call your pharmacy.   Lab work:  NOT NEEDED    Testing/Procedures:  NOT NEEDED   Follow-Up: At [facility], you and your health needs are our priority.  As part of our continuing mission to provide you with exceptional heart care, we have created designated Provider Care Teams.  These Care Teams include your primary Cardiologist (physician) and Advanced Practice Providers (APPs -  Physician Assistants and Nurse Practitioners) who all work together to provide you with the care you need, when you need it. You will need a follow up appointment .   You may see DR BRAIN CRENSHAW  May 28, 2019 AT 9 AM   Any Other Special Instructions Will Be Listed Below .  You have been referred to  HF  CLINIC appt with  Dr Haroldine Laws or  Dr Aundra Dubin, first available -  Will be contacted by heart failure  Clinic - located at  [place] [facility]

## 2019-05-25 DIAGNOSIS — Z91040 Latex allergy status: Secondary | ICD-10-CM | POA: Diagnosis not present

## 2019-05-25 DIAGNOSIS — Z79899 Other long term (current) drug therapy: Secondary | ICD-10-CM | POA: Diagnosis not present

## 2019-05-25 DIAGNOSIS — Z91041 Radiographic dye allergy status: Secondary | ICD-10-CM | POA: Diagnosis not present

## 2019-05-25 DIAGNOSIS — I5021 Acute systolic (congestive) heart failure: Secondary | ICD-10-CM | POA: Diagnosis not present

## 2019-05-25 DIAGNOSIS — R531 Weakness: Secondary | ICD-10-CM | POA: Diagnosis not present

## 2019-05-25 DIAGNOSIS — I509 Heart failure, unspecified: Secondary | ICD-10-CM | POA: Diagnosis not present

## 2019-05-25 DIAGNOSIS — Z03818 Encounter for observation for suspected exposure to other biological agents ruled out: Secondary | ICD-10-CM | POA: Diagnosis not present

## 2019-05-25 DIAGNOSIS — I4892 Unspecified atrial flutter: Secondary | ICD-10-CM | POA: Diagnosis not present

## 2019-05-25 DIAGNOSIS — Z91030 Bee allergy status: Secondary | ICD-10-CM | POA: Diagnosis not present

## 2019-05-25 DIAGNOSIS — R0602 Shortness of breath: Secondary | ICD-10-CM | POA: Diagnosis not present

## 2019-05-25 DIAGNOSIS — R000 Tachycardia, unspecified: Secondary | ICD-10-CM | POA: Diagnosis not present

## 2019-05-25 DIAGNOSIS — Z20828 Contact with and (suspected) exposure to other viral communicable diseases: Secondary | ICD-10-CM | POA: Diagnosis not present

## 2019-05-25 DIAGNOSIS — I420 Dilated cardiomyopathy: Secondary | ICD-10-CM | POA: Diagnosis not present

## 2019-05-25 DIAGNOSIS — J029 Acute pharyngitis, unspecified: Secondary | ICD-10-CM | POA: Diagnosis not present

## 2019-05-26 DIAGNOSIS — R531 Weakness: Secondary | ICD-10-CM | POA: Diagnosis not present

## 2019-05-26 DIAGNOSIS — R000 Tachycardia, unspecified: Secondary | ICD-10-CM | POA: Diagnosis not present

## 2019-05-26 DIAGNOSIS — I4892 Unspecified atrial flutter: Secondary | ICD-10-CM | POA: Diagnosis not present

## 2019-05-26 DIAGNOSIS — R0602 Shortness of breath: Secondary | ICD-10-CM | POA: Diagnosis not present

## 2019-05-26 NOTE — H&P (View-Only) (Signed)
Virtual Visit via Video Note   This visit type was conducted due to national recommendations for restrictions regarding the COVID-19 Pandemic (e.g. social distancing) in an effort to limit this patient's exposure and mitigate transmission in our community.  Due to his co-morbid illnesses, this patient is at least at moderate risk for complications without adequate follow up.  This format is felt to be most appropriate for this patient at this time.  All issues noted in this document were discussed and addressed.  A limited physical exam was performed with this format.  Please refer to the patient's chart for his consent to telehealth for [facility].   Date:  05/28/2019   ID:  [name]., DOB Mar 13, 1931, MRN [id number]  Patient Location:Home Provider Location: Home  PCP:  Lajean Manes, MD  Cardiologist:  Dr Stanford Breed  Evaluation Performed:  Follow-Up Visit  Chief Complaint:  FU cardiomyopathy  History of Present Illness:    Previously followed by Dr Margaretann Loveless.  Recently seen and noted to have tachycardia.  He was therefore referred for an echocardiogram.  Echo June 2020 showed ejection fraction 25 to 30%, mild biatrial enlargement, mild mitral and tricuspid regurgitation. Cardiac catheterization July 2020 showed a 20% circumflex lesion, right atrial pressure of 14 and pulmonary capillary wedge pressure 8.  Medical therapy recommended.  Patient apparently was seen in [place] 2 days ago for sore throat.  He apparently was found to be in atrial flutter and was admitted.  Echocardiogram showed severe LV dysfunction.  He also apparently had ventricular tachycardia for 10 minutes though I do not have those results available and apparently he was asymptomatic.  Cardioversion was recommended but patient left AGAINST MEDICAL ADVICE.  Patient states that since April he has had increased dyspnea with more vigorous activities.  Also with less energy.  He denies palpitations, exertional chest  pain or syncope.  No orthopnea, PND.  He did have pedal edema in April which has resolved.  The patient does not have symptoms concerning for COVID-19 infection (fever, chills, cough, or new shortness of breath).    Past Medical History:  Diagnosis Date   Melanoma ([place])    Vitamin B 12 deficiency    Past Surgical History:  Procedure Laterality Date   RIGHT/LEFT HEART CATH AND CORONARY ANGIOGRAPHY N/A 04/27/2019   Procedure: RIGHT/LEFT HEART CATH AND CORONARY ANGIOGRAPHY;  Surgeon: Jolaine Artist, MD;  Location: [facility];  Service: Cardiovascular;  Laterality: N/A;     Current Meds  Medication Sig   metoprolol succinate (TOPROL XL) 25 MG 24 hr tablet Take 1 tablet (25 mg total) by mouth daily.   sacubitril-valsartan (ENTRESTO) 24-26 MG Take 1 tablet by mouth 2 (two) times daily.     Allergies:   Povidone-iodine, Tape, and Bee venom   Social History   Tobacco Use   Smoking status: Never Smoker   Smokeless tobacco: Never Used  Substance Use Topics   Alcohol use: Not on file   Drug use: Not on file     Family Hx: The patient's family history is not on file.  ROS:   Please see the history of present illness.    No Fever, chills  or productive cough All other systems reviewed and are negative.  Recent Labs: 04/23/2019: BUN 18; Creatinine, Ser 1.43; Platelets 207 04/27/2019: Hemoglobin 11.9; Potassium 2.8; Sodium 148    Wt Readings from Last 3 Encounters:  05/28/19 200 lb (90.7 kg)  05/20/19 200 lb (90.7 kg)  04/27/19 205 lb (93 kg)     Objective:    Vital Signs:  BP 122/88    Pulse (!) 121    Ht 5\' 10"  (1.778 m)    Wt 200 lb (90.7 kg)    BMI 28.70 kg/m    VITAL SIGNS:  reviewed NAD Answers questions appropriately Normal affect Remainder of physical examination not performed (telehealth visit; coronavirus pandemic)  Electrocardiogram-April 21, 2019-personally reviewed.  Probable atrial flutter, left ventricular hypertrophy, left anterior  fascicular block.  ASSESSMENT & PLAN:    1. Nonischemic cardiomyopathy-etiology of cardiomyopathy may be tachycardia mediated given atrial flutter on recent electrocardiogram.  Recent catheterization showed no coronary disease.  Recent TSH mildly elevated but free T4 normal.  No history of alcohol abuse.  He did not receive chemotherapeutic agents to treat his melanoma that are associated with cardiomyopathy.  Plan to continue Toprol and Entresto. 2. Atrial flutter-CHADSvasc 3.  Add apixaban 5 mg twice daily.  Discontinue aspirin.  I think his cardiomyopathy is likely tachycardia mediated.  I have discussed TEE guided cardioversion.  He is agreeable.  We will arrange for next week.  I will then see him back in 2 weeks.  I discussed the potential for referral for atrial flutter ablation and we will make that decision when I see him back.  Hold Toprol morning of cardioversion. 3. Chronic systolic congestive heart failure-patient states he has not swelling at present nor is he dyspneic other than with vigorous activities.  This should improve once sinus is reestablished. 4. History of metastatic melanoma-Per oncology.  COVID-19 Education: The importance of social distancing was discussed today.  Time:   Today, I have spent 45 minutes with the patient with telehealth technology discussing the above problems.     Medication Adjustments/Labs and Tests Ordered: Current medicines are reviewed at length with the patient today.  Concerns regarding medicines are outlined above.   Tests Ordered: No orders of the defined types were placed in this encounter.   Medication Changes: No orders of the defined types were placed in this encounter.   Follow Up:  Virtual Visit or In Person in 2 week(s)  Signed, Kirk Ruths, MD  05/28/2019 8:51 AM    [place]

## 2019-05-26 NOTE — Progress Notes (Signed)
° °Virtual Visit via Video Note  ° °This visit type was conducted due to national recommendations for restrictions regarding the COVID-19 Pandemic (e.g. social distancing) in an effort to limit this patient's exposure and mitigate transmission in our community.  Due to his co-morbid illnesses, this patient is at least at moderate risk for complications without adequate follow up.  This format is felt to be most appropriate for this patient at this time.  All issues noted in this document were discussed and addressed.  A limited physical exam was performed with this format.  Please refer to the patient's chart for his consent to telehealth for [facility].  ° °Date:  05/28/2019  ° °ID:  Raymond G Spees Jr., DOB 03/14/1931, MRN [id number] ° °Patient Location:Home °Provider Location: Home ° °PCP:  Stoneking, Hal, MD  °Cardiologist:  Dr Esparanza Krider ° °Evaluation Performed:  Follow-Up Visit ° °Chief Complaint:  FU cardiomyopathy ° °History of Present Illness:   ° °Previously followed by Dr Acharya.  Recently seen and noted to have tachycardia.  He was therefore referred for an echocardiogram.  Echo June 2020 showed ejection fraction 25 to 30%, mild biatrial enlargement, mild mitral and tricuspid regurgitation. Cardiac catheterization July 2020 showed a 20% circumflex lesion, right atrial pressure of 14 and pulmonary capillary wedge pressure 8.  Medical therapy recommended.  Patient apparently was seen in [place] 2 days ago for sore throat.  He apparently was found to be in atrial flutter and was admitted.  Echocardiogram showed severe LV dysfunction.  He also apparently had ventricular tachycardia for 10 minutes though I do not have those results available and apparently he was asymptomatic.  Cardioversion was recommended but patient left AGAINST MEDICAL ADVICE.  Patient states that since April he has had increased dyspnea with more vigorous activities.  Also with less energy.  He denies palpitations, exertional chest  pain or syncope.  No orthopnea, PND.  He did have pedal edema in April which has resolved. ° °The patient does not have symptoms concerning for COVID-19 infection (fever, chills, cough, or new shortness of breath).  ° ° °Past Medical History:  °Diagnosis Date  °• Melanoma (HCC)   °• Vitamin B 12 deficiency   ° °Past Surgical History:  °Procedure Laterality Date  °• RIGHT/LEFT HEART CATH AND CORONARY ANGIOGRAPHY N/A 04/27/2019  ° Procedure: RIGHT/LEFT HEART CATH AND CORONARY ANGIOGRAPHY;  Surgeon: Bensimhon, Benda R, MD;  Location: [facility];  Service: Cardiovascular;  Laterality: N/A;  °  ° °Current Meds  °Medication Sig  °• metoprolol succinate (TOPROL XL) 25 MG 24 hr tablet Take 1 tablet (25 mg total) by mouth daily.  °• sacubitril-valsartan (ENTRESTO) 24-26 MG Take 1 tablet by mouth 2 (two) times daily.  °  ° °Allergies:   Povidone-iodine, Tape, and Bee venom  ° °Social History  ° °Tobacco Use  °• Smoking status: Never Smoker  °• Smokeless tobacco: Never Used  °Substance Use Topics  °• Alcohol use: Not on file  °• Drug use: Not on file  °  ° °Family Hx: °The patient's family history is not on file. ° °ROS:   °Please see the history of present illness.    °No Fever, chills  or productive cough °All other systems reviewed and are negative. ° °Recent Labs: °04/23/2019: BUN 18; Creatinine, Ser 1.43; Platelets 207 °04/27/2019: Hemoglobin 11.9; Potassium 2.8; Sodium 148  ° ° °Wt Readings from Last 3 Encounters:  °05/28/19 200 lb (90.7 kg)  °05/20/19 200 lb (90.7 kg)  °  04/27/19 205 lb (93 kg)  °  ° °Objective:   ° °Vital Signs:  BP 122/88    Pulse (!) 121    Ht 5' 10" (1.778 m)    Wt 200 lb (90.7 kg)    BMI 28.70 kg/m²   ° °VITAL SIGNS:  reviewed °NAD °Answers questions appropriately °Normal affect °Remainder of physical examination not performed (telehealth visit; coronavirus pandemic) ° °Electrocardiogram-April 21, 2019-personally reviewed.  Probable atrial flutter, left ventricular hypertrophy, left anterior  fascicular block. ° °ASSESSMENT & PLAN:   ° °1. Nonischemic cardiomyopathy-etiology of cardiomyopathy may be tachycardia mediated given atrial flutter on recent electrocardiogram.  Recent catheterization showed no coronary disease.  Recent TSH mildly elevated but free T4 normal.  No history of alcohol abuse.  He did not receive chemotherapeutic agents to treat his melanoma that are associated with cardiomyopathy.  Plan to continue Toprol and Entresto. °2. Atrial flutter-CHADSvasc 3.  Add apixaban 5 mg twice daily.  Discontinue aspirin.  I think his cardiomyopathy is likely tachycardia mediated.  I have discussed TEE guided cardioversion.  He is agreeable.  We will arrange for next week.  I will then see him back in 2 weeks.  I discussed the potential for referral for atrial flutter ablation and we will make that decision when I see him back.  Hold Toprol morning of cardioversion. °3. Chronic systolic congestive heart failure-patient states he has not swelling at present nor is he dyspneic other than with vigorous activities.  This should improve once sinus is reestablished. °4. History of metastatic melanoma-Per oncology. ° °COVID-19 Education: °The importance of social distancing was discussed today. ° °Time:   °Today, I have spent 45 minutes with the patient with telehealth technology discussing the above problems.   ° ° °Medication Adjustments/Labs and Tests Ordered: °Current medicines are reviewed at length with the patient today.  Concerns regarding medicines are outlined above.  ° °Tests Ordered: °No orders of the defined types were placed in this encounter. ° ° °Medication Changes: °No orders of the defined types were placed in this encounter. ° ° °Follow Up:  Virtual Visit or In Person in 2 week(s) ° °Signed, °Maicy Filip, MD  °05/28/2019 8:51 AM    °[facility] °

## 2019-05-28 ENCOUNTER — Telehealth (INDEPENDENT_AMBULATORY_CARE_PROVIDER_SITE_OTHER): Payer: Medicare Other | Admitting: Cardiology

## 2019-05-28 ENCOUNTER — Other Ambulatory Visit: Payer: Self-pay

## 2019-05-28 ENCOUNTER — Encounter: Payer: Self-pay | Admitting: *Deleted

## 2019-05-28 ENCOUNTER — Other Ambulatory Visit: Payer: Self-pay | Admitting: *Deleted

## 2019-05-28 VITALS — BP 122/88 | HR 121 | Ht 70.0 in | Wt 200.0 lb

## 2019-05-28 DIAGNOSIS — I5041 Acute combined systolic (congestive) and diastolic (congestive) heart failure: Secondary | ICD-10-CM

## 2019-05-28 DIAGNOSIS — I420 Dilated cardiomyopathy: Secondary | ICD-10-CM

## 2019-05-28 DIAGNOSIS — I4892 Unspecified atrial flutter: Secondary | ICD-10-CM

## 2019-05-28 MED ORDER — GENERIC EXTERNAL MEDICATION
Status: DC
Start: ? — End: 2019-05-28

## 2019-05-28 MED ORDER — APIXABAN 5 MG PO TABS: 5.0000 mg | ORAL_TABLET | Freq: Two times a day (BID) | ORAL | 6 refills | Status: DC

## 2019-05-28 MED ORDER — NITROGLYCERIN 0.4 MG SL SUBL
0.40 | SUBLINGUAL_TABLET | SUBLINGUAL | Status: DC
Start: ? — End: 2019-05-28

## 2019-05-28 MED ORDER — APIXABAN 5 MG PO TABS
5.00 | ORAL_TABLET | ORAL | Status: DC
Start: 2019-05-26 — End: 2019-05-28

## 2019-05-28 MED ORDER — ACETAMINOPHEN 325 MG PO TABS
650.00 | ORAL_TABLET | ORAL | Status: DC
Start: ? — End: 2019-05-28

## 2019-05-28 MED ORDER — METOPROLOL SUCCINATE ER 25 MG PO TB24
25.00 | ORAL_TABLET | ORAL | Status: DC
Start: 2019-05-27 — End: 2019-05-28

## 2019-05-28 MED ORDER — HYDROCODONE-ACETAMINOPHEN 10-325 MG PO TABS
1.00 | ORAL_TABLET | ORAL | Status: DC
Start: ? — End: 2019-05-28

## 2019-05-28 MED ORDER — SODIUM CHLORIDE 0.9 % IV SOLN
10.00 | INTRAVENOUS | Status: DC
Start: ? — End: 2019-05-28

## 2019-05-28 MED ORDER — HYDROCODONE-ACETAMINOPHEN 5-325 MG PO TABS
1.00 | ORAL_TABLET | ORAL | Status: DC
Start: ? — End: 2019-05-28

## 2019-05-28 MED ORDER — SACUBITRIL-VALSARTAN 24-26 MG PO TABS
1.00 | ORAL_TABLET | ORAL | Status: DC
Start: 2019-05-26 — End: 2019-05-28

## 2019-05-28 MED ORDER — HYDRALAZINE HCL 20 MG/ML IJ SOLN
10.00 | INTRAMUSCULAR | Status: DC
Start: ? — End: 2019-05-28

## 2019-05-28 MED ORDER — ALUM & MAG HYDROXIDE-SIMETH 200-200-20 MG/5ML PO SUSP
30.00 | ORAL | Status: DC
Start: ? — End: 2019-05-28

## 2019-05-28 NOTE — Patient Instructions (Addendum)
Medication Instructions:  STOP ASPIRIN  START ELIQUIS 5 MG TWICE DAILY  If you need a refill on your cardiac medications before your next appointment, please call your pharmacy.   Lab work: If you have labs (blood work) drawn today and your tests are completely normal, you will receive your results only by: Marland Kitchen MyChart Message (if you have MyChart) OR . A paper copy in the mail If you have any lab test that is abnormal or we need to change your treatment, we will call you to review the results.  Testing/Procedures: Your physician has requested that you have a TEE/Cardioversion. During a TEE, sound waves are used to create images of your heart. It provides your doctor with information about the size and shape of your heart and how well your heart's chambers and valves are working. In this test, a transducer is attached to the end of a flexible tube that is guided down you throat and into your esophagus (the tube leading from your mouth to your stomach) to get a more detailed image of your heart. Once the TEE has determined that a blood clot is not present, the cardioversion begins. Electrical Cardioversion uses a jolt of electricity to your heart either through paddles or wired patches attached to your chest. This is a controlled, usually prescheduled, procedure. This procedure is done at the hospital and you are not awake during the procedure. You usually go home the day of the procedure. Please see the instruction sheet given to you today for more information.    Follow-Up: At [facility], you and your health needs are our priority.  As part of our continuing mission to provide you with exceptional heart care, we have created designated Provider Care Teams.  These Care Teams include your primary Cardiologist (physician) and Advanced Practice Providers (APPs -  Physician Assistants and Nurse Practitioners) who all work together to provide you with the care you need, when you need it. Your  physician recommends that you schedule a follow-up appointment in: 2 [place] are scheduled for a TEE Cardioversion on Wednesday 06/02/2019 with Dr. Marlou Porch.  Please arrive at the [facility] (Main Entrance A) at [facility]: [address] [place],  [address] at 10:15 am. (1 hour prior to procedure unless lab work is needed; if lab work is needed arrive 1.5 hours ahead)  DIET: Nothing to eat or drink after midnight except a sip of water with medications (see medication instructions below)  Medication Instructions: DO NOT TAKE METOPROLOL THE MORNING OF THE PROCEDURE  Continue your anticoagulant: ELIQUIS  GO TO [place] FOR COVID TESTING Saturday 06/02/2019.   You must have a responsible person to drive you home and stay in the waiting area during your procedure. Failure to do so could result in cancellation.  Bring your insurance cards.  *Special Note: Every effort is made to have your procedure done on time. Occasionally there are emergencies that occur at the hospital that may cause delays. Please be patient if a delay does occur.

## 2019-05-29 ENCOUNTER — Other Ambulatory Visit (HOSPITAL_COMMUNITY)
Admission: RE | Admit: 2019-05-29 | Discharge: 2019-05-29 | Disposition: A | Discharge status: Home / Self Care | Payer: Medicare Other | Source: Ambulatory Visit | Attending: Cardiology | Admitting: Cardiology

## 2019-05-29 DIAGNOSIS — Z01812 Encounter for preprocedural laboratory examination: Secondary | ICD-10-CM | POA: Insufficient documentation

## 2019-05-29 DIAGNOSIS — I340 Nonrheumatic mitral (valve) insufficiency: Secondary | ICD-10-CM | POA: Diagnosis not present

## 2019-05-29 DIAGNOSIS — I4892 Unspecified atrial flutter: Secondary | ICD-10-CM | POA: Diagnosis not present

## 2019-05-29 DIAGNOSIS — Z20828 Contact with and (suspected) exposure to other viral communicable diseases: Secondary | ICD-10-CM | POA: Insufficient documentation

## 2019-05-29 LAB — SARS CORONAVIRUS 2 (TAT 6-24 HRS): SARS Coronavirus 2: NEGATIVE

## 2019-06-01 NOTE — Progress Notes (Signed)
Spoke with pt wife. She states pt has not had any fevers, shortness of breath, etc. Pt has been quarantined since covid test. Will be here tomorrow at 1015 for appointment

## 2019-06-01 NOTE — Progress Notes (Signed)
HPI: Previously followed by Dr Margaretann Loveless. Recently seen and noted to have tachycardia.  He was therefore referred for an echocardiogram.  Echo June 2020 showed ejection fraction 25 to 30%, mild biatrial enlargement, mild mitral and tricuspid regurgitation. Cardiac catheterization July 2020 showed a 20% circumflex lesion, right atrial pressure of 14 and pulmonary capillary wedge pressure 8.  Medical therapy recommended.  Pt then seen in [place] and found to be in atrial flutter. Echocardiogram showed severe LV dysfunction.  He also apparently had ventricular tachycardia for 10 minutes though I do not have those results available and apparently he was asymptomatic.  Cardioversion was recommended but patient left AGAINST MEDICAL ADVICE.  When I first evaluated the patient on August 14 he noted increased dyspnea on exertion since April.  We arranged TEE guided cardioversion which was performed June 02, 2019.  TEE showed ejection fraction 20 to 25%, mild left atrial and right atrial enlargement and cardioversion was successful..  Since last seen patient has some fatigue with activities.  He has noticed increased cough with lying flat but thinks it is sinus congestion.  Denies increased pedal edema, chest pain or syncope.  Current Outpatient Medications  Medication Sig Dispense Refill  . apixaban (ELIQUIS) 5 MG TABS tablet Take 1 tablet (5 mg total) by mouth 2 (two) times daily. 60 tablet 6  . Hydrocortisone (ZOXWRUEAV-40 EX) Apply 1 application topically 3 (three) times daily as needed (itching/sking irritation.).    Marland Kitchen metoprolol succinate (TOPROL XL) 25 MG 24 hr tablet Take 1 tablet (25 mg total) by mouth daily. (Patient taking differently: Take 25 mg by mouth every evening. ) 90 tablet 3  . sacubitril-valsartan (ENTRESTO) 24-26 MG Take 1 tablet by mouth 2 (two) times daily. 28 tablet 0  . tetrahydrozoline 0.05 % ophthalmic solution Place 1-2 drops into both eyes 3 (three) times daily as needed  (dry/irritated eyes.).     No current facility-administered medications for this visit.      Past Medical History:  Diagnosis Date  . Melanoma ([place])   . Vitamin B 12 deficiency     Past Surgical History:  Procedure Laterality Date  . CARDIOVERSION N/A 06/02/2019   Procedure: CARDIOVERSION;  Surgeon: Jerline Pain, MD;  Location: [facility] ENDOSCOPY;  Service: Cardiovascular;  Laterality: N/A;  . RIGHT/LEFT HEART CATH AND CORONARY ANGIOGRAPHY N/A 04/27/2019   Procedure: RIGHT/LEFT HEART CATH AND CORONARY ANGIOGRAPHY;  Surgeon: Jolaine Artist, MD;  Location: [place] CV LAB;  Service: Cardiovascular;  Laterality: N/A;  . TEE WITHOUT CARDIOVERSION N/A 06/02/2019   Procedure: TRANSESOPHAGEAL ECHOCARDIOGRAM (TEE);  Surgeon: Jerline Pain, MD;  Location: [facility] ENDOSCOPY;  Service: Cardiovascular;  Laterality: N/A;    Social History   Socioeconomic History  . Marital status: Married    Spouse name: Not on file  . Number of children: Not on file  . Years of education: Not on file  . Highest education level: Not on file  Occupational History  . Not on file  Social Needs  . Financial resource strain: Not on file  . Food insecurity    Worry: Not on file    Inability: Not on file  . Transportation needs    Medical: Not on file    Non-medical: Not on file  Tobacco Use  . Smoking status: Never Smoker  . Smokeless tobacco: Never Used  Substance and Sexual Activity  . Alcohol use: Yes    Alcohol/week: 7.0 standard drinks    Types: 7 Glasses  of wine per week  . Drug use: Never  . Sexual activity: Not on file  Lifestyle  . Physical activity    Days per week: Not on file    Minutes per session: Not on file  . Stress: Not on file  Relationships  . Social Herbalist on phone: Not on file    Gets together: Not on file    Attends religious service: Not on file    Active member of club or organization: Not on file    Attends meetings of clubs or organizations: Not on file     Relationship status: Not on file  . Intimate partner violence    Fear of current or ex partner: Not on file    Emotionally abused: Not on file    Physically abused: Not on file    Forced sexual activity: Not on file  Other Topics Concern  . Not on file  Social History Narrative  . Not on file    History reviewed. No pertinent family history.  ROS: no fevers or chills, productive cough, hemoptysis, dysphasia, odynophagia, melena, hematochezia, dysuria, hematuria, rash, seizure activity, orthopnea, PND, pedal edema, claudication. Remaining systems are negative.  Physical Exam: Well-developed well-nourished in no acute distress.  Skin is warm and dry.  HEENT is normal.  Neck is supple.  Chest is clear to auscultation with normal expansion.  Cardiovascular exam is regular rate and rhythm.  Abdominal exam nontender or distended. No masses palpated. Extremities show no edema. neuro grossly intact  ECG-sinus rhythm with PACs, left ventricular hypertrophy, left axis deviation.  Personally reviewed  A/P  1 nonischemic cardiomyopathy-this was felt to be tachycardia mediated.  Previous catheterization did not reveal significant coronary disease.  We will plan to continue Toprol and Entresto.  Now that he is in sinus rhythm we will plan to repeat echocardiogram in 3 to 4 months.  Hopefully LV function will have improved.  2 atrial flutter-patient is status post TEE guided cardioversion and remains in sinus rhythm.  Continue Toprol at present dose.  Continue apixaban.  I discussed referral to electrophysiology for consideration of ablation to avoid long-term anticoagulation.  However patient declined at this time.  3 chronic systolic congestive heart failure-patient is not volume overloaded on examination.  He does describe a cough with lying flat which certainly could be related to orthopnea/volume excess.  I offered Lasix but patient declined at this point.  We discussed fluid restriction  and low-sodium diet.  We can add Lasix in the future if needed.  4 metastatic melanoma-followed by oncology.  Kirk Ruths, MD

## 2019-06-02 ENCOUNTER — Other Ambulatory Visit (HOSPITAL_COMMUNITY): Payer: Self-pay | Admitting: *Deleted

## 2019-06-02 ENCOUNTER — Other Ambulatory Visit: Payer: Self-pay

## 2019-06-02 ENCOUNTER — Encounter (HOSPITAL_COMMUNITY): Payer: Self-pay | Admitting: *Deleted

## 2019-06-02 ENCOUNTER — Ambulatory Visit (HOSPITAL_COMMUNITY): Payer: Medicare Other | Admitting: Certified Registered Nurse Anesthetist

## 2019-06-02 ENCOUNTER — Ambulatory Visit (HOSPITAL_BASED_OUTPATIENT_CLINIC_OR_DEPARTMENT_OTHER): Payer: Medicare Other

## 2019-06-02 ENCOUNTER — Ambulatory Visit (HOSPITAL_COMMUNITY)
Admission: RE | Admit: 2019-06-02 | Discharge: 2019-06-02 | Disposition: A | Discharge status: Home / Self Care | Payer: Medicare Other | Attending: Cardiology | Admitting: Cardiology

## 2019-06-02 ENCOUNTER — Encounter (HOSPITAL_COMMUNITY)
Admission: RE | Disposition: A | Discharge status: Home / Self Care | Payer: Medicare Other | Source: Home / Self Care | Attending: Cardiology

## 2019-06-02 DIAGNOSIS — I429 Cardiomyopathy, unspecified: Secondary | ICD-10-CM | POA: Diagnosis not present

## 2019-06-02 DIAGNOSIS — I4892 Unspecified atrial flutter: Secondary | ICD-10-CM

## 2019-06-02 DIAGNOSIS — Z888 Allergy status to other drugs, medicaments and biological substances status: Secondary | ICD-10-CM | POA: Insufficient documentation

## 2019-06-02 DIAGNOSIS — Z7901 Long term (current) use of anticoagulants: Secondary | ICD-10-CM | POA: Insufficient documentation

## 2019-06-02 DIAGNOSIS — I509 Heart failure, unspecified: Secondary | ICD-10-CM | POA: Diagnosis not present

## 2019-06-02 DIAGNOSIS — I483 Typical atrial flutter: Secondary | ICD-10-CM

## 2019-06-02 DIAGNOSIS — I361 Nonrheumatic tricuspid (valve) insufficiency: Secondary | ICD-10-CM | POA: Diagnosis not present

## 2019-06-02 DIAGNOSIS — I340 Nonrheumatic mitral (valve) insufficiency: Secondary | ICD-10-CM | POA: Diagnosis not present

## 2019-06-02 DIAGNOSIS — Z79899 Other long term (current) drug therapy: Secondary | ICD-10-CM | POA: Diagnosis not present

## 2019-06-02 DIAGNOSIS — I110 Hypertensive heart disease with heart failure: Secondary | ICD-10-CM | POA: Diagnosis not present

## 2019-06-02 DIAGNOSIS — Z85820 Personal history of malignant melanoma of skin: Secondary | ICD-10-CM | POA: Diagnosis not present

## 2019-06-02 DIAGNOSIS — I5022 Chronic systolic (congestive) heart failure: Secondary | ICD-10-CM | POA: Insufficient documentation

## 2019-06-02 HISTORY — PX: CARDIOVERSION: SHX1299

## 2019-06-02 HISTORY — PX: TEE WITHOUT CARDIOVERSION: SHX5443

## 2019-06-02 LAB — POCT I-STAT 4, (NA,K, GLUC, HGB,HCT)
Glucose, Bld: 102 mg/dL — ABNORMAL HIGH (ref 70–99)
HCT: 45 % (ref 39.0–52.0)
Hemoglobin: 15.3 g/dL (ref 13.0–17.0)
Potassium: 4.3 mmol/L (ref 3.5–5.1)
Sodium: 139 mmol/L (ref 135–145)

## 2019-06-02 SURGERY — ECHOCARDIOGRAM, TRANSESOPHAGEAL
Anesthesia: Monitor Anesthesia Care

## 2019-06-02 MED ORDER — SODIUM CHLORIDE 0.9 % IV SOLN
INTRAVENOUS | Status: DC
  Administered 2019-06-02: 1000 mL via INTRAVENOUS

## 2019-06-02 MED ORDER — LIDOCAINE 2% (20 MG/ML) 5 ML SYRINGE
INTRAMUSCULAR | Status: DC | PRN
  Administered 2019-06-02: 40 mg via INTRAVENOUS

## 2019-06-02 MED ORDER — PROPOFOL 500 MG/50ML IV EMUL
INTRAVENOUS | Status: DC | PRN
  Administered 2019-06-02: 75 ug/kg/min via INTRAVENOUS

## 2019-06-02 MED ORDER — LACTATED RINGERS IV SOLN: INTRAVENOUS | Status: DC

## 2019-06-02 MED ORDER — ALBUMIN HUMAN 5 % IV SOLN
INTRAVENOUS | Status: AC
  Filled 2019-06-02: qty 250

## 2019-06-02 NOTE — Discharge Instructions (Signed)

## 2019-06-02 NOTE — Interval H&P Note (Signed)
History and Physical Interval Note:  06/02/2019 10:25 AM  Raymond Hurley.  has presented today for surgery, with the diagnosis of AFLUTTER.  The various methods of treatment have been discussed with the patient and family. After consideration of risks, benefits and other options for treatment, the patient has consented to  Procedure(s): TRANSESOPHAGEAL ECHOCARDIOGRAM (TEE) (N/A) CARDIOVERSION (N/A) as a surgical intervention.  The patient's history has been reviewed, patient examined, no change in status, stable for surgery.  I have reviewed the patient's chart and labs.  Questions were answered to the patient's satisfaction.     [employer]

## 2019-06-02 NOTE — Progress Notes (Signed)
Pt ambulated to restroom with steady gait, no complaints. Wife reports pt BP at home averages 90/50.

## 2019-06-02 NOTE — Transfer of Care (Signed)
Immediate Anesthesia Transfer of Care Note  Patient: Raymond Hurley.  Procedure(s) Performed: TRANSESOPHAGEAL ECHOCARDIOGRAM (TEE) (N/A ) CARDIOVERSION (N/A )  Patient Location: Endoscopy Unit  Anesthesia Type:General  Level of Consciousness: awake, alert  and oriented  Airway & Oxygen Therapy: Patient Spontanous Breathing and Patient connected to nasal cannula oxygen  Post-op Assessment: Report given to RN and Post -op Vital signs reviewed and stable  Post vital signs: Reviewed, stable  Last Vitals:  Vitals Value Taken Time  BP 100/70   Temp    Pulse 83   Resp 12   SpO2 95     Last Pain:  Vitals:   06/02/19 1037  TempSrc: Oral  PainSc: 0-No pain         Complications: No apparent anesthesia complications

## 2019-06-02 NOTE — CV Procedure (Signed)
   Transesophageal Echocardiogram  Indications: AFLUTTER - Cardioversion  Time out performed Anesthesia - propofol  Findings:  Left Ventricle: EF 25%  Mitral Valve: normal, mild MR  Aortic Valve: Normal  Tricuspid Valve: Normal mild TR  Left Atrium: No thrombus  Successful cardioversion 120J. NSR  Candee Furbish, MD

## 2019-06-02 NOTE — Anesthesia Procedure Notes (Signed)
Procedure Name: MAC Date/Time: 06/02/2019 11:07 AM Performed by: Alain Marion, CRNA Pre-anesthesia Checklist: Patient identified, Emergency Drugs available, Suction available and Patient being monitored Oxygen Delivery Method: Nasal cannula Placement Confirmation: positive ETCO2

## 2019-06-02 NOTE — Anesthesia Preprocedure Evaluation (Addendum)
Anesthesia Evaluation  Patient identified by MRN, date of birth, ID band Patient awake    Reviewed: Allergy & Precautions, NPO status , Patient's Chart, lab work & pertinent test results  History of Anesthesia Complications Negative for: history of anesthetic complications  Airway Mallampati: II  TM Distance: >3 FB Neck ROM: Full    Dental no notable dental hx. (+) Dental Advisory Given   Pulmonary neg pulmonary ROS,    Pulmonary exam normal        Cardiovascular hypertension, Pt. on medications and Pt. on home beta blockers +CHF  Normal cardiovascular exam Rhythm:Irregular  IMPRESSIONS    1. The left ventricle has severely reduced systolic function, with an ejection fraction of 25-30%. The cavity size was normal. Left ventricular diastolic Doppler parameters are indeterminate.  2. The right ventricle has normal systolic function. The cavity was normal. There is no increase in right ventricular wall thickness.  3. Left atrial size was mildly dilated.  4. Right atrial size was mildly dilated.  5. The aortic root and ascending aorta are normal in size and structure.  6. The inferior vena cava was normal in size with <50% respiratory variability.  7. Mild mitral and tricuspid valve regurgitation.   Neuro/Psych negative neurological ROS     GI/Hepatic negative GI ROS, Neg liver ROS,   Endo/Other  negative endocrine ROS  Renal/GU negative Renal ROS     Musculoskeletal negative musculoskeletal ROS (+)   Abdominal   Peds  Hematology negative hematology ROS (+)   Anesthesia Other Findings Day of surgery medications reviewed with the patient.  Reproductive/Obstetrics                            Anesthesia Physical Anesthesia Plan  ASA: III  Anesthesia Plan: MAC   Post-op Pain Management:    Induction: Intravenous  PONV Risk Score and Plan: 2 and Ondansetron and Propofol  infusion  Airway Management Planned: Mask  Additional Equipment:   Intra-op Plan:   Post-operative Plan:   Informed Consent: I have reviewed the patients History and Physical, chart, labs and discussed the procedure including the risks, benefits and alternatives for the proposed anesthesia with the patient or authorized representative who has indicated his/her understanding and acceptance.       Plan Discussed with: Anesthesiologist  Anesthesia Plan Comments:       Anesthesia Quick Evaluation

## 2019-06-02 NOTE — Progress Notes (Signed)
  Echocardiogram Echocardiogram Transesophageal has been performed.  Bobbye Charleston 06/02/2019, 11:36 AM

## 2019-06-03 ENCOUNTER — Encounter (HOSPITAL_COMMUNITY): Payer: Self-pay | Admitting: Cardiology

## 2019-06-03 NOTE — Anesthesia Postprocedure Evaluation (Addendum)
Anesthesia Post Note  Patient: Renee Beale.  Procedure(s) Performed: TRANSESOPHAGEAL ECHOCARDIOGRAM (TEE) (N/A ) CARDIOVERSION (N/A )     Patient location during evaluation: PACU Anesthesia Type: MAC Level of consciousness: sedated Pain management: pain level controlled Vital Signs Assessment: post-procedure vital signs reviewed and stable Respiratory status: spontaneous breathing and respiratory function stable Cardiovascular status: stable Postop Assessment: no apparent nausea or vomiting Anesthetic complications: no    Last Vitals:  Vitals:   06/02/19 1335 06/02/19 1340  BP: (!) 123/53 (!) 98/59  Pulse: 88 74  Resp: 17 17  Temp:    SpO2: 100% 100%    Last Pain:  Vitals:   06/02/19 1340  TempSrc:   PainSc: 0-No pain                 Maydell Knoebel 

## 2019-06-03 NOTE — Addendum Note (Signed)
Addendum  created 06/03/19 1100 by Duane Boston, MD   Clinical Note Signed, Intraprocedure SmartForms edited

## 2019-06-07 ENCOUNTER — Encounter: Payer: Self-pay | Admitting: Cardiology

## 2019-06-07 ENCOUNTER — Other Ambulatory Visit: Payer: Self-pay

## 2019-06-07 ENCOUNTER — Ambulatory Visit (INDEPENDENT_AMBULATORY_CARE_PROVIDER_SITE_OTHER): Payer: Medicare Other | Admitting: Cardiology

## 2019-06-07 VITALS — BP 108/60 | HR 62 | Temp 97.5°F | Ht 70.0 in | Wt 207.0 lb

## 2019-06-07 DIAGNOSIS — I420 Dilated cardiomyopathy: Secondary | ICD-10-CM | POA: Diagnosis not present

## 2019-06-07 DIAGNOSIS — I4892 Unspecified atrial flutter: Secondary | ICD-10-CM

## 2019-06-07 DIAGNOSIS — R931 Abnormal findings on diagnostic imaging of heart and coronary circulation: Secondary | ICD-10-CM | POA: Diagnosis not present

## 2019-06-07 NOTE — Patient Instructions (Signed)
Medication Instructions:  NO CHANGE If you need a refill on your cardiac medications before your next appointment, please call your pharmacy.   Lab work: If you have labs (blood work) drawn today and your tests are completely normal, you will receive your results only by: Marland Kitchen MyChart Message (if you have MyChart) OR . A paper copy in the mail If you have any lab test that is abnormal or we need to change your treatment, we will call you to review the results.  Testing/Procedures: Your physician has requested that you have an echocardiogram. Echocardiography is a painless test that uses sound waves to create images of your heart. It provides your doctor with information about the size and shape of your heart and how well your heart's chambers and valves are working. This procedure takes approximately one hour. There are no restrictions for this procedure.SCHEDULE IN 3 MONTHS - [place]    Follow-Up: At [facility], you and your health needs are our priority.  As part of our continuing mission to provide you with exceptional heart care, we have created designated Provider Care Teams.  These Care Teams include your primary Cardiologist (physician) and Advanced Practice Providers (APPs -  Physician Assistants and Nurse Practitioners) who all work together to provide you with the care you need, when you need it. Your physician recommends that you schedule a follow-up appointment in: [place]

## 2019-06-23 DIAGNOSIS — Z23 Encounter for immunization: Secondary | ICD-10-CM | POA: Diagnosis not present

## 2019-07-06 DIAGNOSIS — Z85828 Personal history of other malignant neoplasm of skin: Secondary | ICD-10-CM | POA: Diagnosis not present

## 2019-07-06 DIAGNOSIS — L570 Actinic keratosis: Secondary | ICD-10-CM | POA: Diagnosis not present

## 2019-07-06 DIAGNOSIS — Z85820 Personal history of malignant melanoma of skin: Secondary | ICD-10-CM | POA: Diagnosis not present

## 2019-07-06 DIAGNOSIS — L821 Other seborrheic keratosis: Secondary | ICD-10-CM | POA: Diagnosis not present

## 2019-07-06 DIAGNOSIS — D1801 Hemangioma of skin and subcutaneous tissue: Secondary | ICD-10-CM | POA: Diagnosis not present

## 2019-07-06 DIAGNOSIS — D692 Other nonthrombocytopenic purpura: Secondary | ICD-10-CM | POA: Diagnosis not present

## 2019-07-06 DIAGNOSIS — L812 Freckles: Secondary | ICD-10-CM | POA: Diagnosis not present

## 2019-07-30 DIAGNOSIS — C7889 Secondary malignant neoplasm of other digestive organs: Secondary | ICD-10-CM | POA: Diagnosis not present

## 2019-07-30 DIAGNOSIS — D8989 Other specified disorders involving the immune mechanism, not elsewhere classified: Secondary | ICD-10-CM | POA: Diagnosis not present

## 2019-07-30 DIAGNOSIS — Z7901 Long term (current) use of anticoagulants: Secondary | ICD-10-CM | POA: Diagnosis not present

## 2019-07-30 DIAGNOSIS — C779 Secondary and unspecified malignant neoplasm of lymph node, unspecified: Secondary | ICD-10-CM | POA: Diagnosis not present

## 2019-07-30 DIAGNOSIS — C434 Malignant melanoma of scalp and neck: Secondary | ICD-10-CM | POA: Diagnosis not present

## 2019-07-30 DIAGNOSIS — I4892 Unspecified atrial flutter: Secondary | ICD-10-CM | POA: Diagnosis not present

## 2019-07-30 DIAGNOSIS — C778 Secondary and unspecified malignant neoplasm of lymph nodes of multiple regions: Secondary | ICD-10-CM | POA: Diagnosis not present

## 2019-07-30 DIAGNOSIS — Z9889 Other specified postprocedural states: Secondary | ICD-10-CM | POA: Diagnosis not present

## 2019-07-30 DIAGNOSIS — K8689 Other specified diseases of pancreas: Secondary | ICD-10-CM | POA: Diagnosis not present

## 2019-07-30 DIAGNOSIS — I499 Cardiac arrhythmia, unspecified: Secondary | ICD-10-CM | POA: Diagnosis not present

## 2019-07-30 DIAGNOSIS — C7989 Secondary malignant neoplasm of other specified sites: Secondary | ICD-10-CM | POA: Diagnosis not present

## 2019-07-30 DIAGNOSIS — R918 Other nonspecific abnormal finding of lung field: Secondary | ICD-10-CM | POA: Diagnosis not present

## 2019-08-06 ENCOUNTER — Telehealth: Payer: Self-pay | Admitting: Cardiology

## 2019-08-06 NOTE — Telephone Encounter (Signed)
Tried to do PA through cover my meds and would not go through. PA completed by calling the company. Patient made aware.

## 2019-08-06 NOTE — Telephone Encounter (Signed)
New Message   Pt c/o medication issue:  1. Name of Medication: metoprolol succinate (TOPROL XL) 25 MG 24 hr tablet And sacubitril-valsartan (ENTRESTO) 24-26 MG     2. How are you currently taking this medication (dosage and times per day)?  3. Are you having a reaction (difficulty breathing--STAT)?   4. What is your medication issue? Patients wife is calling on his behalf stating that the prescription vendor is requesting a prior authorization. Please call to discuss.

## 2019-08-12 DIAGNOSIS — C772 Secondary and unspecified malignant neoplasm of intra-abdominal lymph nodes: Secondary | ICD-10-CM | POA: Diagnosis not present

## 2019-08-12 DIAGNOSIS — C439 Malignant melanoma of skin, unspecified: Secondary | ICD-10-CM | POA: Diagnosis not present

## 2019-08-17 NOTE — Telephone Encounter (Signed)
Routed to primary nurse concerning PA(s)

## 2019-08-17 NOTE — Telephone Encounter (Signed)
° °  Patient calling to follow up on prior auth for Metoprolol

## 2019-08-17 NOTE — Telephone Encounter (Signed)
Spoke with pt, aware metoprolol does not need PA

## 2019-09-06 NOTE — Progress Notes (Signed)
HPI: Previously followed by Dr Margaretann Loveless. Previously seen and noted to have tachycardia. He was therefore referred for an echocardiogram. EchoJune 2020 showed ejection fraction 25 to 30%, mild biatrial enlargement, mild mitral and tricuspid regurgitation. Cardiac catheterization July 2020 showed a 20% circumflex lesion, right atrial pressure of 14 and pulmonary capillary wedge pressure 8. Medical therapy recommended. Pt then seen in [place] and found to be in atrial flutter. Echocardiogram showed severe LV dysfunction. He also apparently had ventricular tachycardia for 10 minutes though I do not have those results available and apparently he was asymptomatic. Cardioversion was recommended but patient left AGAINST MEDICAL ADVICE.  When I first evaluated the patient on 8/20 he noted increased dyspnea on exertion since April.  We arranged TEE guided cardioversion which was performed June 02, 2019.  TEE showed ejection fraction 20 to 25%, mild left atrial and right atrial enlargement and cardioversion was successful..  Since last seen patient his dyspnea has resolved.  He denies palpitations, chest pain.  Occasional minimal pedal edema.  Current Outpatient Medications  Medication Sig Dispense Refill  . apixaban (ELIQUIS) 5 MG TABS tablet Take 1 tablet (5 mg total) by mouth 2 (two) times daily. 60 tablet 6  . Hydrocortisone (OACZYSAYT-01 EX) Apply 1 application topically 3 (three) times daily as needed (itching/sking irritation.).    Marland Kitchen metoprolol succinate (TOPROL XL) 25 MG 24 hr tablet Take 1 tablet (25 mg total) by mouth daily. (Patient taking differently: Take 25 mg by mouth every evening. ) 90 tablet 3  . sacubitril-valsartan (ENTRESTO) 24-26 MG Take 1 tablet by mouth 2 (two) times daily. 28 tablet 0  . tetrahydrozoline 0.05 % ophthalmic solution Place 1-2 drops into both eyes 3 (three) times daily as needed (dry/irritated eyes.).     No current facility-administered medications for this  visit.      Past Medical History:  Diagnosis Date  . Melanoma ([place])   . Vitamin B 12 deficiency     Past Surgical History:  Procedure Laterality Date  . CARDIOVERSION N/A 06/02/2019   Procedure: CARDIOVERSION;  Surgeon: Jerline Pain, MD;  Location: [facility] ENDOSCOPY;  Service: Cardiovascular;  Laterality: N/A;  . RIGHT/LEFT HEART CATH AND CORONARY ANGIOGRAPHY N/A 04/27/2019   Procedure: RIGHT/LEFT HEART CATH AND CORONARY ANGIOGRAPHY;  Surgeon: Jolaine Artist, MD;  Location: [place] CV LAB;  Service: Cardiovascular;  Laterality: N/A;  . TEE WITHOUT CARDIOVERSION N/A 06/02/2019   Procedure: TRANSESOPHAGEAL ECHOCARDIOGRAM (TEE);  Surgeon: Jerline Pain, MD;  Location: [facility] ENDOSCOPY;  Service: Cardiovascular;  Laterality: N/A;    Social History   Socioeconomic History  . Marital status: Married    Spouse name: Not on file  . Number of children: Not on file  . Years of education: Not on file  . Highest education level: Not on file  Occupational History  . Not on file  Social Needs  . Financial resource strain: Not on file  . Food insecurity    Worry: Not on file    Inability: Not on file  . Transportation needs    Medical: Not on file    Non-medical: Not on file  Tobacco Use  . Smoking status: Never Smoker  . Smokeless tobacco: Never Used  Substance and Sexual Activity  . Alcohol use: Yes    Alcohol/week: 7.0 standard drinks    Types: 7 Glasses of wine per week  . Drug use: Never  . Sexual activity: Not on file  Lifestyle  . Physical activity  Days per week: Not on file    Minutes per session: Not on file  . Stress: Not on file  Relationships  . Social Herbalist on phone: Not on file    Gets together: Not on file    Attends religious service: Not on file    Active member of club or organization: Not on file    Attends meetings of clubs or organizations: Not on file    Relationship status: Not on file  . Intimate partner violence    Fear of  current or ex partner: Not on file    Emotionally abused: Not on file    Physically abused: Not on file    Forced sexual activity: Not on file  Other Topics Concern  . Not on file  Social History Narrative  . Not on file    No family history on file.  ROS: no fevers or chills, productive cough, hemoptysis, dysphasia, odynophagia, melena, hematochezia, dysuria, hematuria, rash, seizure activity, orthopnea, PND, pedal edema, claudication. Remaining systems are negative.  Physical Exam: Well-developed well-nourished in no acute distress.  Skin is warm and dry.  HEENT is normal.  Neck is supple.  Chest is clear to auscultation with normal expansion.  Cardiovascular exam is regular rate and rhythm.  Abdominal exam nontender or distended. No masses palpated. Extremities show no edema. neuro grossly intact  ECG-sinus bradycardia, left anterior fascicular block, nonspecific T wave changes.  Personally reviewed  A/P  1 nonischemic cardiomyopathy-this was felt to be tachycardia mediated.  Previous cardiac catheterization showed no coronary disease.  Continue Toprol and Entresto.  Follow-up echocardiogram shows some improvement in LV function with ejection fraction now 45%.  We will plan to follow-up studies in the future.  2 history of atrial flutter-patient remains in sinus rhythm.  Continue beta-blocker and apixaban.  He declines evaluation by electrophysiology for consideration of ablation.  We can reconsider if he has more frequent episodes in the future.  3 chronic systolic congestive heart failure-patient appears to be doing reasonably well from a volume standpoint.  Continue fluid restriction and low-sodium diet.    Kirk Ruths, MD

## 2019-09-07 ENCOUNTER — Other Ambulatory Visit: Payer: Self-pay

## 2019-09-07 ENCOUNTER — Ambulatory Visit (HOSPITAL_COMMUNITY): Payer: Medicare Other | Attending: Cardiology

## 2019-09-07 DIAGNOSIS — I420 Dilated cardiomyopathy: Secondary | ICD-10-CM | POA: Diagnosis not present

## 2019-09-07 DIAGNOSIS — R931 Abnormal findings on diagnostic imaging of heart and coronary circulation: Secondary | ICD-10-CM | POA: Diagnosis not present

## 2019-09-14 ENCOUNTER — Ambulatory Visit (INDEPENDENT_AMBULATORY_CARE_PROVIDER_SITE_OTHER): Payer: Medicare Other | Admitting: Cardiology

## 2019-09-14 ENCOUNTER — Encounter: Payer: Self-pay | Admitting: Cardiology

## 2019-09-14 ENCOUNTER — Other Ambulatory Visit: Payer: Self-pay

## 2019-09-14 VITALS — BP 100/60 | HR 67 | Temp 97.0°F | Ht 70.5 in | Wt 207.0 lb

## 2019-09-14 DIAGNOSIS — I420 Dilated cardiomyopathy: Secondary | ICD-10-CM | POA: Diagnosis not present

## 2019-09-14 DIAGNOSIS — I5022 Chronic systolic (congestive) heart failure: Secondary | ICD-10-CM

## 2019-09-14 DIAGNOSIS — I4892 Unspecified atrial flutter: Secondary | ICD-10-CM

## 2019-09-14 NOTE — Patient Instructions (Signed)
Medication Instructions:  NO CHANGE *If you need a refill on your cardiac medications before your next appointment, please call your pharmacy*  Lab Work: If you have labs (blood work) drawn today and your tests are completely normal, you will receive your results only by: . MyChart Message (if you have MyChart) OR . A paper copy in the mail If you have any lab test that is abnormal or we need to change your treatment, we will call you to review the results.  Follow-Up: At [facility], you and your health needs are our priority.  As part of our continuing mission to provide you with exceptional heart care, we have created designated Provider Care Teams.  These Care Teams include your primary Cardiologist (physician) and Advanced Practice Providers (APPs -  Physician Assistants and Nurse Practitioners) who all work together to provide you with the care you need, when you need it.  Your next appointment:   6 month(s)  The format for your next appointment:   Either In Person or Virtual  Provider:   You may see BRIAN CRENSHAW MD or one of the following Advanced Practice Providers on your designated Care Team:    Luke Kilroy, PA-C  Callie Goodrich, PA-C  Jesse Cleaver, FNP    

## 2019-10-20 DIAGNOSIS — Z23 Encounter for immunization: Secondary | ICD-10-CM | POA: Diagnosis not present

## 2019-11-12 ENCOUNTER — Telehealth: Payer: Self-pay | Admitting: Internal Medicine

## 2019-11-12 NOTE — Telephone Encounter (Signed)
Patient's wife is calling stating their insurance sent them a letter stating they need prior authorization for the rest of the year for sacubitril-valsartan (ENTRESTO) 24-26 MG

## 2019-11-12 NOTE — Telephone Encounter (Signed)
PA needed for [facility] for the new year.  Will route to Primary Nurse to advise.

## 2019-11-15 NOTE — Telephone Encounter (Signed)
Called patient and wife- advised that message was sent over to nurse for the PA for Entresto.  They ask that when it goes through to call them and let them know. Thank you!

## 2019-11-15 NOTE — Telephone Encounter (Signed)
Follow Up  Patient's wife is calling back in for the prior authorization for medication Entresto. States it needs to be faxed to [facility].   [facility]  Member ID: [id number] Fax #: [phone]

## 2019-11-16 MED ORDER — ENTRESTO 24-26 MG PO TABS: 3.0000 | ORAL_TABLET | Freq: Two times a day (BID) | ORAL | 3 refills | Status: DC

## 2019-11-16 NOTE — Telephone Encounter (Signed)
PRIOR AUTHORIZATION WAS DONE VIA TELEPHONE. UNABLE TO do [employer]  DX I50.22      medication was approved .  Case# [id number]   from 10/17/19 to 11/15/20    patient's wife  aware -- per patient's wife  rx to [facility]  - medication e-sent

## 2019-12-07 ENCOUNTER — Telehealth: Payer: Self-pay | Admitting: Cardiology

## 2019-12-07 MED ORDER — ENTRESTO 24-26 MG PO TABS: 1.0000 | ORAL_TABLET | Freq: Two times a day (BID) | ORAL | 3 refills | Status: DC

## 2019-12-07 NOTE — Telephone Encounter (Signed)
New Message  Pt c/o medication issue:  1. Name of Medication:   sacubitril-valsartan (ENTRESTO) 24-26 MG    2. How are you currently taking this medication (dosage and times per day)?   3. Are you having a reaction (difficulty breathing--STAT)? n/a  4. What is your medication issue? Raymond Hurley for [facility] called and she wanted to verify dosage of this med. She said the pt used to take it 1 tablet per day and prescription shows 3 tablet 2x a day. Pharmacy needs to verify correct dosage.  Please advise

## 2019-12-07 NOTE — Telephone Encounter (Signed)
Returned call to [facility]-advised rx should be Entresto 24/26 mg 1 tablet PO BID.  New rx sent per request.

## 2020-01-04 ENCOUNTER — Other Ambulatory Visit: Payer: Self-pay | Admitting: Cardiology

## 2020-01-04 DIAGNOSIS — L821 Other seborrheic keratosis: Secondary | ICD-10-CM | POA: Diagnosis not present

## 2020-01-04 DIAGNOSIS — D225 Melanocytic nevi of trunk: Secondary | ICD-10-CM | POA: Diagnosis not present

## 2020-01-04 DIAGNOSIS — L570 Actinic keratosis: Secondary | ICD-10-CM | POA: Diagnosis not present

## 2020-01-04 DIAGNOSIS — D1801 Hemangioma of skin and subcutaneous tissue: Secondary | ICD-10-CM | POA: Diagnosis not present

## 2020-01-04 DIAGNOSIS — C44619 Basal cell carcinoma of skin of left upper limb, including shoulder: Secondary | ICD-10-CM | POA: Diagnosis not present

## 2020-01-04 DIAGNOSIS — Z85828 Personal history of other malignant neoplasm of skin: Secondary | ICD-10-CM | POA: Diagnosis not present

## 2020-01-04 DIAGNOSIS — L812 Freckles: Secondary | ICD-10-CM | POA: Diagnosis not present

## 2020-01-04 DIAGNOSIS — Z85820 Personal history of malignant melanoma of skin: Secondary | ICD-10-CM | POA: Diagnosis not present

## 2020-01-04 DIAGNOSIS — I4892 Unspecified atrial flutter: Secondary | ICD-10-CM

## 2020-02-02 DIAGNOSIS — C799 Secondary malignant neoplasm of unspecified site: Secondary | ICD-10-CM | POA: Diagnosis not present

## 2020-02-02 DIAGNOSIS — K8689 Other specified diseases of pancreas: Secondary | ICD-10-CM | POA: Diagnosis not present

## 2020-02-02 DIAGNOSIS — R600 Localized edema: Secondary | ICD-10-CM | POA: Diagnosis not present

## 2020-02-02 DIAGNOSIS — C772 Secondary and unspecified malignant neoplasm of intra-abdominal lymph nodes: Secondary | ICD-10-CM | POA: Diagnosis not present

## 2020-02-02 DIAGNOSIS — Z8719 Personal history of other diseases of the digestive system: Secondary | ICD-10-CM | POA: Diagnosis not present

## 2020-02-02 DIAGNOSIS — K439 Ventral hernia without obstruction or gangrene: Secondary | ICD-10-CM | POA: Diagnosis not present

## 2020-02-02 DIAGNOSIS — Z85820 Personal history of malignant melanoma of skin: Secondary | ICD-10-CM | POA: Diagnosis not present

## 2020-02-02 DIAGNOSIS — R5383 Other fatigue: Secondary | ICD-10-CM | POA: Diagnosis not present

## 2020-02-02 DIAGNOSIS — C779 Secondary and unspecified malignant neoplasm of lymph node, unspecified: Secondary | ICD-10-CM | POA: Diagnosis not present

## 2020-02-02 DIAGNOSIS — Z7901 Long term (current) use of anticoagulants: Secondary | ICD-10-CM | POA: Diagnosis not present

## 2020-02-02 DIAGNOSIS — Z9889 Other specified postprocedural states: Secondary | ICD-10-CM | POA: Diagnosis not present

## 2020-02-02 DIAGNOSIS — I4892 Unspecified atrial flutter: Secondary | ICD-10-CM | POA: Diagnosis not present

## 2020-02-02 DIAGNOSIS — C4339 Malignant melanoma of other parts of face: Secondary | ICD-10-CM | POA: Diagnosis not present

## 2020-02-02 DIAGNOSIS — L989 Disorder of the skin and subcutaneous tissue, unspecified: Secondary | ICD-10-CM | POA: Diagnosis not present

## 2020-02-02 DIAGNOSIS — I420 Dilated cardiomyopathy: Secondary | ICD-10-CM | POA: Diagnosis not present

## 2020-02-02 DIAGNOSIS — R918 Other nonspecific abnormal finding of lung field: Secondary | ICD-10-CM | POA: Diagnosis not present

## 2020-02-02 DIAGNOSIS — C434 Malignant melanoma of scalp and neck: Secondary | ICD-10-CM | POA: Diagnosis not present

## 2020-02-02 DIAGNOSIS — Z79899 Other long term (current) drug therapy: Secondary | ICD-10-CM | POA: Diagnosis not present

## 2020-02-02 DIAGNOSIS — C4490 Unspecified malignant neoplasm of skin, unspecified: Secondary | ICD-10-CM | POA: Diagnosis not present

## 2020-02-25 NOTE — Progress Notes (Signed)
HPI: Previously followed by Dr Margaretann Loveless. Previously seen and noted to have tachycardia. He was therefore referred for an echocardiogram. EchoJune 2020 showed ejection fraction 25 to 30%, mild biatrial enlargement, mild mitral and tricuspid regurgitation. Cardiac catheterization July 2020 showed a 20% circumflex lesion, right atrial pressure of 14 and pulmonary capillary wedge pressure 8. Medical therapy recommended.Pt then seen in [place] and found to be in atrial flutter. Echocardiogram showed severe LV dysfunction. He also apparently had ventricular tachycardia for 10 minutes though I do not have those results available and apparently he was asymptomatic. Cardioversion was recommended but patient left AGAINST MEDICAL ADVICE.When I first evaluated the patient on 8/20 he noted increased dyspnea on exertion since April. We arranged TEE guided cardioversion which was performed June 02, 2019. TEE showed ejection fraction 20 to 25%, mild left atrial and right atrial enlargement and cardioversion was successful.  Echocardiogram November 2020 showed ejection fraction 45%, trace mitral and tricuspid regurgitation and mildly dilated ascending aorta. Since last seen he has less energy but denies dyspnea, chest pain or syncope.  No bleeding.  Minimal pedal edema.  He has been seen recently by his primary care and noticed his potassium was trending higher.  Current Outpatient Medications  Medication Sig Dispense Refill  . ELIQUIS 5 MG TABS tablet TAKE ONE TABLET TWICE DAILY 60 tablet 6  . Hydrocortisone (GNOIBBCWU-88 EX) Apply 1 application topically 3 (three) times daily as needed (itching/sking irritation.).    Marland Kitchen metoprolol succinate (TOPROL XL) 25 MG 24 hr tablet Take 1 tablet (25 mg total) by mouth daily. (Patient taking differently: Take 25 mg by mouth every evening. ) 90 tablet 3  . sacubitril-valsartan (ENTRESTO) 24-26 MG Take 1 tablet by mouth 2 (two) times daily. 180 tablet 3  .  tetrahydrozoline 0.05 % ophthalmic solution Place 1-2 drops into both eyes 3 (three) times daily as needed (dry/irritated eyes.).     No current facility-administered medications for this visit.     Past Medical History:  Diagnosis Date  . Melanoma ([place])   . Vitamin B 12 deficiency     Past Surgical History:  Procedure Laterality Date  . CARDIOVERSION N/A 06/02/2019   Procedure: CARDIOVERSION;  Surgeon: Jerline Pain, MD;  Location: [facility] ENDOSCOPY;  Service: Cardiovascular;  Laterality: N/A;  . RIGHT/LEFT HEART CATH AND CORONARY ANGIOGRAPHY N/A 04/27/2019   Procedure: RIGHT/LEFT HEART CATH AND CORONARY ANGIOGRAPHY;  Surgeon: Jolaine Artist, MD;  Location: [place] CV LAB;  Service: Cardiovascular;  Laterality: N/A;  . TEE WITHOUT CARDIOVERSION N/A 06/02/2019   Procedure: TRANSESOPHAGEAL ECHOCARDIOGRAM (TEE);  Surgeon: Jerline Pain, MD;  Location: [facility] ENDOSCOPY;  Service: Cardiovascular;  Laterality: N/A;    Social History   Socioeconomic History  . Marital status: Married    Spouse name: Not on file  . Number of children: Not on file  . Years of education: Not on file  . Highest education level: Not on file  Occupational History  . Not on file  Tobacco Use  . Smoking status: Never Smoker  . Smokeless tobacco: Never Used  Substance and Sexual Activity  . Alcohol use: Yes    Alcohol/week: 7.0 standard drinks    Types: 7 Glasses of wine per week  . Drug use: Never  . Sexual activity: Not on file  Other Topics Concern  . Not on file  Social History Narrative  . Not on file   Social Determinants of Health   Financial Resource Strain:   .  Difficulty of Paying Living Expenses:   Food Insecurity:   . Worried About Charity fundraiser in the Last Year:   . Arboriculturist in the Last Year:   Transportation Needs:   . Film/video editor (Medical):   Marland Kitchen Lack of Transportation (Non-Medical):   Physical Activity:   . Days of Exercise per Week:   . Minutes of  Exercise per Session:   Stress:   . Feeling of Stress :   Social Connections:   . Frequency of Communication with Friends and Family:   . Frequency of Social Gatherings with Friends and Family:   . Attends Religious Services:   . Active Member of Clubs or Organizations:   . Attends Archivist Meetings:   Marland Kitchen Marital Status:   Intimate Partner Violence:   . Fear of Current or Ex-Partner:   . Emotionally Abused:   Marland Kitchen Physically Abused:   . Sexually Abused:     History reviewed. No pertinent family history.  ROS: Some itching but no fevers or chills, productive cough, hemoptysis, dysphasia, odynophagia, melena, hematochezia, dysuria, hematuria, rash, seizure activity, orthopnea, PND, claudication. Remaining systems are negative.  Physical Exam: Well-developed well-nourished in no acute distress.  Skin is warm and dry.  HEENT is normal.  Neck is supple.  Chest is clear to auscultation with normal expansion.  Cardiovascular exam is irregular Abdominal exam nontender or distended. No masses palpated. Extremities show trace edema. neuro grossly intact  ECG-sinus rhythm with atrial bigeminy, left anterior fascicular block, left ventricular hypertrophy, nonspecific ST changes;personally reviewed.  A/P  1 history of nonischemic cardiomyopathy-this was felt likely tachycardia mediated.  Follow-up echocardiogram showed improving LV function.  Catheterization revealed no coronary disease.  Continue Toprol and Entresto.  Check potassium and renal function.  Repeat echocardiogram in November.  2 history of atrial flutter-continue beta-blocker and apixaban.  Patient remains in sinus.  He has previously declined evaluation by electrophysiology for ablation.  3 chronic systolic congestive heart failure-his volume status has improved as his LV function has improved.  Continue fluid restriction and low-sodium diet.  Kirk Ruths, MD

## 2020-03-02 DIAGNOSIS — H0102B Squamous blepharitis left eye, upper and lower eyelids: Secondary | ICD-10-CM | POA: Diagnosis not present

## 2020-03-02 DIAGNOSIS — H43813 Vitreous degeneration, bilateral: Secondary | ICD-10-CM | POA: Diagnosis not present

## 2020-03-02 DIAGNOSIS — H0102A Squamous blepharitis right eye, upper and lower eyelids: Secondary | ICD-10-CM | POA: Diagnosis not present

## 2020-03-02 DIAGNOSIS — D3131 Benign neoplasm of right choroid: Secondary | ICD-10-CM | POA: Diagnosis not present

## 2020-03-02 DIAGNOSIS — H2513 Age-related nuclear cataract, bilateral: Secondary | ICD-10-CM | POA: Diagnosis not present

## 2020-03-02 DIAGNOSIS — H40013 Open angle with borderline findings, low risk, bilateral: Secondary | ICD-10-CM | POA: Diagnosis not present

## 2020-03-03 ENCOUNTER — Ambulatory Visit (INDEPENDENT_AMBULATORY_CARE_PROVIDER_SITE_OTHER): Payer: Medicare Other | Admitting: Cardiology

## 2020-03-03 ENCOUNTER — Encounter: Payer: Self-pay | Admitting: Cardiology

## 2020-03-03 ENCOUNTER — Other Ambulatory Visit: Payer: Self-pay

## 2020-03-03 VITALS — BP 128/74 | HR 64 | Ht 70.0 in | Wt 212.2 lb

## 2020-03-03 DIAGNOSIS — I4892 Unspecified atrial flutter: Secondary | ICD-10-CM

## 2020-03-03 DIAGNOSIS — I420 Dilated cardiomyopathy: Secondary | ICD-10-CM

## 2020-03-03 DIAGNOSIS — I5022 Chronic systolic (congestive) heart failure: Secondary | ICD-10-CM | POA: Diagnosis not present

## 2020-03-03 NOTE — Patient Instructions (Signed)
Medication Instructions:  NO CHANGE *If you need a refill on your cardiac medications before your next appointment, please call your pharmacy*   Lab Work: Your physician recommends that you HAVE LAB WORK TODAY  If you have labs (blood work) drawn today and your tests are completely normal, you will receive your results only by: Marland Kitchen MyChart Message (if you have MyChart) OR . A paper copy in the mail If you have any lab test that is abnormal or we need to change your treatment, we will call you to review the results.   Follow-Up: At [facility], you and your health needs are our priority.  As part of our continuing mission to provide you with exceptional heart care, we have created designated Provider Care Teams.  These Care Teams include your primary Cardiologist (physician) and Advanced Practice Providers (APPs -  Physician Assistants and Nurse Practitioners) who all work together to provide you with the care you need, when you need it.  We recommend signing up for the patient portal called "MyChart".  Sign up information is provided on this After Visit Summary.  MyChart is used to connect with patients for Virtual Visits (Telemedicine).  Patients are able to view lab/test results, encounter notes, upcoming appointments, etc.  Non-urgent messages can be sent to your provider as well.   To learn more about what you can do with MyChart, go to [email or web address].    Your next appointment:   6 month(s)  The format for your next appointment:   Either In Person or Virtual  Provider:   You may see Kirk Ruths MD or one of the following Advanced Practice Providers on your designated Care Team:    Kerin Ransom, PA-C  [place], [place]  Coletta Memos, [place]

## 2020-03-04 LAB — BASIC METABOLIC PANEL
BUN/Creatinine Ratio: 12 (ref 10–24)
BUN: 14 mg/dL (ref 8–27)
CO2: 28 mmol/L (ref 20–29)
Calcium: 8.4 mg/dL — ABNORMAL LOW (ref 8.6–10.2)
Chloride: 108 mmol/L — ABNORMAL HIGH (ref 96–106)
Creatinine, Ser: 1.19 mg/dL (ref 0.76–1.27)
GFR calc Af Amer: 64 mL/min/{1.73_m2} (ref >59)
GFR calc non Af Amer: 55 mL/min/{1.73_m2} — ABNORMAL LOW (ref >59)
Glucose: 101 mg/dL — ABNORMAL HIGH (ref 65–99)
Potassium: 5 mmol/L (ref 3.5–5.2)
Sodium: 144 mmol/L (ref 134–144)

## 2020-03-27 DIAGNOSIS — J301 Allergic rhinitis due to pollen: Secondary | ICD-10-CM | POA: Diagnosis not present

## 2020-04-25 DIAGNOSIS — C44311 Basal cell carcinoma of skin of nose: Secondary | ICD-10-CM | POA: Diagnosis not present

## 2020-04-25 DIAGNOSIS — D044 Carcinoma in situ of skin of scalp and neck: Secondary | ICD-10-CM | POA: Diagnosis not present

## 2020-05-09 ENCOUNTER — Other Ambulatory Visit: Payer: Self-pay | Admitting: Internal Medicine

## 2020-05-22 NOTE — Telephone Encounter (Signed)
Returned call to pt.  Left a message that his medication has been sent to [facility], per his request.

## 2020-05-22 NOTE — Telephone Encounter (Signed)
*  STAT* If patient is at the pharmacy, call can be transferred to refill team.   1. Which medications need to be refilled? (please list name of each medication and dose if known) metoprolol succinate (TOPROL XL) 25 MG 24 hr tablet    2. Which pharmacy/location (including street and city if local pharmacy) is medication to be sent to? [facility], [place] - [address]  3. Do they need a 30 day or 90 day supply? 90 day

## 2020-06-13 ENCOUNTER — Ambulatory Visit (INDEPENDENT_AMBULATORY_CARE_PROVIDER_SITE_OTHER): Payer: Medicare Other | Admitting: Allergy and Immunology

## 2020-06-13 ENCOUNTER — Encounter: Payer: Self-pay | Admitting: Allergy and Immunology

## 2020-06-13 ENCOUNTER — Other Ambulatory Visit: Payer: Self-pay

## 2020-06-13 ENCOUNTER — Ambulatory Visit: Payer: Medicare Other | Attending: Critical Care Medicine

## 2020-06-13 VITALS — BP 116/78 | HR 78 | Temp 98.5°F | Resp 18 | Ht 68.0 in | Wt 212.0 lb

## 2020-06-13 DIAGNOSIS — T63441D Toxic effect of venom of bees, accidental (unintentional), subsequent encounter: Secondary | ICD-10-CM | POA: Diagnosis not present

## 2020-06-13 DIAGNOSIS — J301 Allergic rhinitis due to pollen: Secondary | ICD-10-CM | POA: Diagnosis not present

## 2020-06-13 DIAGNOSIS — H1010 Acute atopic conjunctivitis, unspecified eye: Secondary | ICD-10-CM

## 2020-06-13 DIAGNOSIS — K219 Gastro-esophageal reflux disease without esophagitis: Secondary | ICD-10-CM | POA: Diagnosis not present

## 2020-06-13 DIAGNOSIS — Z23 Encounter for immunization: Secondary | ICD-10-CM

## 2020-06-13 MED ORDER — MONTELUKAST SODIUM 10 MG PO TABS: ORAL_TABLET | ORAL | 5 refills | Status: DC

## 2020-06-13 MED ORDER — OMEPRAZOLE 40 MG PO CPDR: 40.0000 mg | DELAYED_RELEASE_CAPSULE | Freq: Two times a day (BID) | ORAL | 5 refills | Status: DC

## 2020-06-13 NOTE — Progress Notes (Signed)
[place] [facility] [place] - [place]   Dear Dr. Felipa Eth,  Thank you for referring Raymond Hurley. to the [facility] on 06/13/2020.   Below is a summation of this patient's evaluation and recommendations.  Thank you for your referral. I will keep you informed about this patient's response to treatment.   If you have any questions please do not hesitate to contact me.   Sincerely,  Jiles Prows, MD Allergy / Immunology [place]   ______________________________________________________________________    NEW PATIENT NOTE  Referring Provider: Lajean Manes, MD Primary Provider: Lajean Manes, MD Date of office visit: 06/13/2020    Subjective:   Chief Complaint:  Raymond Hurley (DOB: Aug 24, 1931) is a 86 y.o. male who presents to the clinic on 06/13/2020 with a chief complaint of Allergic Rhinitis  and Sinus Problem .     HPI: TG presents to this clinic in evaluation of springtime allergies.  He is here today with his wife who appeared to bring him to this visit because of her concern for what TG goes through during the spring season.  Apparently TG develops very swollen red watery eyes and nasal congestion and sneezing and postnasal drip and coughing through the spring that starts sometime in mid March and progresses through June that has been a consistent issue over the course of the past several years.  The rest of the year he does very well.  He has been treated with a multitude of different medications and has had side effects from both antihistamines and decongestants in the form of urinary hesitation and bladder obstruction.  He has started Flonase and nasal azelastine this past spring and within several weeks he did much better regarding this issue.  He also has lots of throat clearing and mucus stuck in his throat on a regular basis  throughout the entire year.  Sometimes he needs to wake up at nighttime to clear out his throat.  This can actually occur up to 2 times per night.  He does not have any persistent upper airway symptoms at this point that accompanies this throat issue and he does not have any classic symptoms of reflux.  He does have a history of "bee allergy".  Approximately 20 years ago he was stung by initially [place] bees and then subsequently yellow jackets.  It sounds as though he had a bad reaction to yellowjacket.  Apparently he became short of breath and passed out in the context of having a rapid ventricular rate of around 200.  He has obtained 2 Pfizer Covid vaccines.  Past Medical History:  Diagnosis Date   Melanoma ([place])    Vitamin B 12 deficiency     Past Surgical History:  Procedure Laterality Date   CARDIOVERSION N/A 06/02/2019   Procedure: CARDIOVERSION;  Surgeon: Jerline Pain, MD;  Location: [facility] ENDOSCOPY;  Service: Cardiovascular;  Laterality: N/A;   RIGHT/LEFT HEART CATH AND CORONARY ANGIOGRAPHY N/A 04/27/2019   Procedure: RIGHT/LEFT HEART CATH AND CORONARY ANGIOGRAPHY;  Surgeon: Jolaine Artist, MD;  Location: [facility] CV LAB;  Service: Cardiovascular;  Laterality: N/A;   TEE WITHOUT CARDIOVERSION N/A 06/02/2019   Procedure: TRANSESOPHAGEAL ECHOCARDIOGRAM (TEE);  Surgeon: Jerline Pain, MD;  Location: [facility] ENDOSCOPY;  Service: Cardiovascular;  Laterality: N/A;    Allergies as of 06/13/2020      Reactions   Povidone-iodine Swelling   Tape  Swelling   Bee Venom Rash      Medication List      azelastine 0.1 % nasal spray Commonly known as: ASTELIN SMARTSIG:2 Puff(s) Both Nares Twice Daily   B-12 PO Take by mouth.   [id number]-75 EX Apply 1 application topically 3 (three) times daily as needed (itching/sking irritation.).   Eliquis 5 MG Tabs tablet Generic drug: apixaban TAKE ONE TABLET TWICE DAILY   Entresto 24-26 MG Generic drug: sacubitril-valsartan Take 1  tablet by mouth 2 (two) times daily.   fluticasone 50 MCG/ACT nasal spray Commonly known as: FLONASE Place into both nostrils daily.   metoprolol succinate 25 MG 24 hr tablet Commonly known as: TOPROL-XL Take 1 tablet (25 mg total) by mouth every evening.   STOOL SOFTENER PO Take by mouth.   tetrahydrozoline 0.05 % ophthalmic solution Place 1-2 drops into both eyes 3 (three) times daily as needed (dry/irritated eyes.).   triamcinolone cream 0.1 % Commonly known as: KENALOG Apply topically 2 (two) times daily.       Review of systems negative except as noted in HPI / PMHx or noted below:  Review of Systems  Constitutional: Negative.   HENT: Negative.   Eyes: Negative.   Respiratory: Negative.   Cardiovascular: Negative.   Gastrointestinal: Negative.   Genitourinary: Negative.   Musculoskeletal: Negative.   Skin: Negative.   Neurological: Negative.   Endo/Heme/Allergies: Negative.   Psychiatric/Behavioral: Negative.     History reviewed. No pertinent family history.  Social History   Socioeconomic History   Marital status: Married    Spouse name: Not on file   Number of children: Not on file   Years of education: Not on file   Highest education level: Not on file  Occupational History   Not on file  Tobacco Use   Smoking status: Never Smoker   Smokeless tobacco: Never Used  Vaping Use   Vaping Use: Never used  Substance and Sexual Activity   Alcohol use: Yes    Alcohol/week: 7.0 standard drinks    Types: 7 Glasses of wine per week   Drug use: Never   Sexual activity: Not on file  Other Topics Concern   Not on file  Social History Narrative   Not on file    Environmental and Social history  Lives in a townhouse with a dry environment, no animals located inside the household, no carpet in the bedroom, no plastic on the bed, no plastic on the pillow, smoking ongoing with inside the household.  Objective:   Vitals:   06/13/20 1006    BP: 116/78  Pulse: 78  Resp: 18  Temp: 98.5 F (36.9 C)  SpO2: 97%   Height: 5\' 8"  (172.7 cm) Weight: 212 lb (96.2 kg)  Physical Exam Constitutional:      Appearance: He is not diaphoretic.  HENT:     Head: Normocephalic.     Right Ear: Tympanic membrane, ear canal and external ear normal.     Left Ear: Tympanic membrane, ear canal and external ear normal.     Nose: Nose normal. No mucosal edema or rhinorrhea.     Mouth/Throat:     Pharynx: Uvula midline. No oropharyngeal exudate.  Eyes:     Conjunctiva/sclera: Conjunctivae normal.  Neck:     Thyroid: No thyromegaly.     Trachea: Trachea normal. No tracheal tenderness or tracheal deviation.  Cardiovascular:     Rate and Rhythm: Normal rate and regular rhythm.     Heart sounds: Normal  heart sounds, S1 normal and S2 normal. No murmur heard.   Pulmonary:     Effort: No respiratory distress.     Breath sounds: Normal breath sounds. No stridor. No wheezing or rales.  Lymphadenopathy:     Head:     Right side of head: No tonsillar adenopathy.     Left side of head: No tonsillar adenopathy.     Cervical: No cervical adenopathy.  Skin:    Findings: No erythema or rash.     Nails: There is no clubbing.  Neurological:     Mental Status: He is alert.     Diagnostics: Allergy skin tests were not performed.   Assessment and Plan:    1. Seasonal allergic rhinitis due to pollen   2. Seasonal allergic conjunctivitis   3. Systemic reaction to bee sting, accidental or unintentional, subsequent encounter   4. LPRD (laryngopharyngeal reflux disease)     1. Starting Valentine's Day 2022, use the following:   A. Flonase - 1-2 sprays each nostril 1 time per day  B. Montelukast 10 mg - 1 tablet 1 time per day  2. If needed:   A. OTC Pataday - 1 drop each eye 1 time per day  B. Azelastine - 1-2 sprays each nostril 1-2 times per day (urine issue?)  3. Wear 'sports glasses' during spring while outdoors  4. Causes of  persistent post nasal drip? Possible allergies, LPR, Entresto  5. Can try treatment for reflux / LPR for one month:   A. Minimizing caffeine and chocolate  B. Omeprazole 40 mg - 1 tablet 2 times per day  6. Blood - Venom IgE panel, Area 2 aeroallergen profile  7. Further evaluation and treatment?  8. Obtain fall flu vaccine and Covid booster.   TG appears to have seasonal allergic rhinitis and conjunctivitis that is quite significant and he would do better if he started a preventative plan a few weeks prior to future pollination season and thus I have given him instructions to start both Flonase and montelukast on Valentine's Day of 2022.  He can also wear sports glasses when outdoors during the springtime to protect his eyes from pollen impact and he has the option of utilizing Pataday and nasal azelastine.  His persistent throat clearing and mucus stuck in his throat and occasional throat clearing-like cough is probably secondary to LPR and he has the option of addressing this issue for at least a month to see if treatment for reflux does help this issue.  After today's discussion I do not think that he will be pursuing treatment for LPR as he has some hesitation and aversion to using medications in general.  I will contact him with the results of his blood test once they are available for review.  We will work through his possible hymenoptera venom hypersensitivity state by checking IgE antibodies against hymenoptera venom.  Jiles Prows, MD Allergy / Immunology [facility]

## 2020-06-13 NOTE — Patient Instructions (Addendum)
  1. Starting Valentine's Day 2022, use the following:   A. Flonase - 1-2 sprays each nostril 1 time per day  B. Montelukast 10 mg - 1 tablet 1 time per day  2. If needed:   A. OTC Pataday - 1 drop each eye 1 time per day  B. Azelastine - 1-2 sprays each nostril 1-2 times per day (urine issue?)  3. Wear 'sports glasses' during spring while outdoors  4. Causes of persistent post nasal drip? Possible allergies, LPR, Entresto  5. Can try treatment for reflux / LPR for one month:   A. Minimizing caffeine and chocolate  B. Omeprazole 40 mg - 1 tablet 2 times per day  6. Blood - Venom IgE panel, Area 2 aeroallergen profile  7. Further evaluation and treatment?  8. Obtain fall flu vaccine and Covid booster.

## 2020-06-13 NOTE — Progress Notes (Signed)
   [id number]  Name:  Unknown Schleyer.    MRN: [id number] DOB: [birth year]  06/13/2020  Mr. Frei was observed post Covid-19 immunization for 15 minutes without incident. He was provided with Vaccine Information Sheet and instruction to access the V-Safe system.   Mr. Shaul was instructed to call 911 with any severe reactions post vaccine: Marland Kitchen Difficulty breathing  . Swelling of face and throat  . A fast heartbeat  . A bad rash all over body  . Dizziness and weakness

## 2020-06-14 ENCOUNTER — Encounter: Payer: Self-pay | Admitting: Allergy and Immunology

## 2020-06-15 LAB — ALLERGENS W/TOTAL IGE AREA 2

## 2020-06-15 LAB — ALLERGEN HYMENOPTERA PANEL
Bumblebee: <0.1 kU/L
Honeybee IgE: <0.1 kU/L
Hornet, White Face, IgE: 0.22 kU/L — AB
Hornet, Yellow, IgE: 0.14 kU/L — AB
Paper Wasp IgE: 0.49 kU/L — AB
Yellow Jacket, IgE: 0.48 kU/L — AB

## 2020-06-30 ENCOUNTER — Telehealth: Payer: Self-pay | Admitting: Cardiology

## 2020-06-30 NOTE — Telephone Encounter (Signed)
Patient needs his [employer] paperwork signed. He states it has to be turned in by the end of this month. He wants to know if that is something Dr. Stanford Breed can do. He is also requesting an appt for this month for his physical. I do not see anything available this month with Dr. Stanford Breed or an APP. Please advise next steps.

## 2020-06-30 NOTE — Telephone Encounter (Signed)
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   Appointment scheduled with Kerin Ransom, Pa on 07/05/20 at 3:15 pm.

## 2020-07-04 ENCOUNTER — Other Ambulatory Visit: Payer: Self-pay

## 2020-07-04 MED ORDER — EPINEPHRINE 0.3 MG/0.3ML IJ SOAJ: 0.3000 mg | Freq: Once | INTRAMUSCULAR | 2 refills | Status: AC

## 2020-07-05 ENCOUNTER — Ambulatory Visit (INDEPENDENT_AMBULATORY_CARE_PROVIDER_SITE_OTHER): Payer: Medicare Other | Admitting: Cardiology

## 2020-07-05 ENCOUNTER — Encounter: Payer: Self-pay | Admitting: Cardiology

## 2020-07-05 ENCOUNTER — Other Ambulatory Visit: Payer: Self-pay

## 2020-07-05 DIAGNOSIS — I428 Other cardiomyopathies: Secondary | ICD-10-CM | POA: Diagnosis not present

## 2020-07-05 DIAGNOSIS — Z7901 Long term (current) use of anticoagulants: Secondary | ICD-10-CM

## 2020-07-05 DIAGNOSIS — Z85820 Personal history of malignant melanoma of skin: Secondary | ICD-10-CM

## 2020-07-05 DIAGNOSIS — I480 Paroxysmal atrial fibrillation: Secondary | ICD-10-CM

## 2020-07-05 DIAGNOSIS — E538 Deficiency of other specified B group vitamins: Secondary | ICD-10-CM | POA: Diagnosis not present

## 2020-07-05 NOTE — Progress Notes (Signed)
Cardiology Office Note:    Date:  07/05/2020   ID:  Raymond Hurley., DOB 1931-01-12, MRN [id number]  PCP:  Lajean Manes, MD  Cardiologist:  No primary care provider on file.  Electrophysiologist:  None   Referring MD: Lajean Manes, MD   No chief complaint on file.   History of Present Illness:    Raymond Hurley. is a pleasant 86 y.o. male with a hx of PAF and NICM.  Patient has a history of an EF of 20% in July 2020.  Catheterization then showed no significant coronary disease.  It was felt his symptoms were secondary to atrial fibrillation atrial flutter.  He ultimately underwent TEE cardioversion in August 2020 to sinus rhythm.  He declined EP evaluation for flutter therapy.  Follow-up echocardiogram November 2020 showed improvement in his ejection fraction to 45%.  He saw Dr. Stanford Breed in May 2021 and was doing well.  He is in the office today for routine follow up.  He asked about Systems analyst, he has been a Insurance underwriter for many years.   Since we saw him last he is done well. He and his wife are staying at their [place] in [place].  He denies any tachycardia or palpitations.  EKG in the office today shows sinus rhythm with a heart rate of 60.  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  They will require a 24-hour Holter, a stress test, sleep study, and echocardiogram, and thyroid function test.  He has 30 days to complete this.  I explained that it was unlikely we are going to be able to complete all that in 30 days.  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   Past Medical History:  Diagnosis Date  . Melanoma ([place])   . Vitamin B 12 deficiency     Past Surgical History:  Procedure Laterality Date  . CARDIOVERSION N/A 06/02/2019   Procedure: CARDIOVERSION;  Surgeon: Jerline Pain, MD;  Location: [facility] ENDOSCOPY;  Service: Cardiovascular;  Laterality: N/A;  . RIGHT/LEFT HEART CATH AND CORONARY  ANGIOGRAPHY N/A 04/27/2019   Procedure: RIGHT/LEFT HEART CATH AND CORONARY ANGIOGRAPHY;  Surgeon: Jolaine Artist, MD;  Location: [facility] CV LAB;  Service: Cardiovascular;  Laterality: N/A;  . TEE WITHOUT CARDIOVERSION N/A 06/02/2019   Procedure: TRANSESOPHAGEAL ECHOCARDIOGRAM (TEE);  Surgeon: Jerline Pain, MD;  Location: [facility] ENDOSCOPY;  Service: Cardiovascular;  Laterality: N/A;    Current Medications: Current Meds  Medication Sig  . Cyanocobalamin (B-12 PO) Take by mouth.  Mariane Baumgarten Calcium (STOOL SOFTENER PO) Take by mouth.  Arne Cleveland 5 MG TABS tablet TAKE ONE TABLET TWICE DAILY  . fluticasone (FLONASE) 50 MCG/ACT nasal spray Place into both nostrils daily.  . Hydrocortisone ([id number]-26 EX) Apply 1 application topically 3 (three) times daily as needed (itching/sking irritation.).  Marland Kitchen metoprolol succinate (TOPROL-XL) 25 MG 24 hr tablet Take 1 tablet (25 mg total) by mouth every evening.  . sacubitril-valsartan (ENTRESTO) 24-26 MG Take 1 tablet by mouth 2 (two) times daily.  Marland Kitchen tetrahydrozoline 0.05 % ophthalmic solution Place 1-2 drops into both eyes 3 (three) times daily as needed (dry/irritated eyes.).  Marland Kitchen triamcinolone cream (KENALOG) 0.1 % Apply topically 2 (two) times daily.     Allergies:   Povidone-iodine, Tape, and Bee venom   Social History   Socioeconomic History  . Marital status: Married    Spouse name: Not on file  . Number  of children: Not on file  . Years of education: Not on file  . Highest education level: Not on file  Occupational History  . Not on file  Tobacco Use  . Smoking status: Never Smoker  . Smokeless tobacco: Never Used  Vaping Use  . Vaping Use: Never used  Substance and Sexual Activity  . Alcohol use: Yes    Alcohol/week: 7.0 standard drinks    Types: 7 Glasses of wine per week  . Drug use: Never  . Sexual activity: Not on file  Other Topics Concern  . Not on file  Social History Narrative  . Not on file   Social Determinants of  Health   Financial Resource Strain:   . Difficulty of Paying Living Expenses: Not on file  Food Insecurity:   . Worried About Charity fundraiser in the Last Year: Not on file  . Ran Out of Food in the Last Year: Not on file  Transportation Needs:   . Lack of Transportation (Medical): Not on file  . Lack of Transportation (Non-Medical): Not on file  Physical Activity:   . Days of Exercise per Week: Not on file  . Minutes of Exercise per Session: Not on file  Stress:   . Feeling of Stress : Not on file  Social Connections:   . Frequency of Communication with Friends and Family: Not on file  . Frequency of Social Gatherings with Friends and Family: Not on file  . Attends Religious Services: Not on file  . Active Member of Clubs or Organizations: Not on file  . Attends Archivist Meetings: Not on file  . Marital Status: Not on file     Family History: The patient's family history is not on file.  ROS:   Please see the history of present illness.     All other systems reviewed and are negative.  EKGs/Labs/Other Studies Reviewed:    The following studies were reviewed today: Echo 09/07/2019-- IMPRESSIONS    1. Left ventricular ejection fraction, by visual estimation, is 45%. The  left ventricle has mildly decreased function. There is mildly increased  left ventricular hypertrophy.  2. The left ventricle demonstrates global hypokinesis.  3. Left atrial size was normal.  4. Right atrial size was normal.  5. The mitral valve is normal in structure. Trace mitral valve  regurgitation. No evidence of mitral stenosis.  6. The tricuspid valve is normal in structure. Tricuspid valve  regurgitation is trivial.  7. The aortic valve is tricuspid. Aortic valve regurgitation is not  visualized. No evidence of aortic valve sclerosis or stenosis.  8. There is mild dilatation of the ascending aorta measuring 38 mm.  9. Global right ventricle has normal systolic  function.The right  ventricular size is normal. No increase in right ventricular wall  thickness.  10. The inferior vena cava is normal in size with greater than 50%  respiratory variability, suggesting right atrial pressure of 3 mmHg.  11. The tricuspid regurgitant velocity is 2.12 m/s, and with an assumed  right atrial pressure of 3 mmHg, the estimated right ventricular systolic  pressure is normal at 21.0 mmHg.   EKG:  EKG is  ordered today.  The ekg ordered today demonstrates NSR, HR 60, LAD, IVCD, QTc 430  Recent Labs: 03/03/2020: BUN 14; Creatinine, Ser 1.19; Potassium 5.0; Sodium 144  Recent Lipid Panel No results found for: CHOL, TRIG, HDL, CHOLHDL, VLDL, LDLCALC, LDLDIRECT  Physical Exam:    VS:  BP 136/80  Pulse 85   Temp (!) 97.2 F (36.2 C)   Ht 5\' 8"  (1.727 m)   Wt 209 lb 9.6 oz (95.1 kg)   SpO2 97%   BMI 31.87 kg/m     Wt Readings from Last 3 Encounters:  07/05/20 209 lb 9.6 oz (95.1 kg)  06/13/20 212 lb (96.2 kg)  03/03/20 212 lb 3.2 oz (96.3 kg)     GEN:  Well nourished, well developed in no acute distress HEENT: Normal NECK: No JVD CARDIAC: RRR, no murmurs, rubs, gallops RESPIRATORY:  Clear to auscultation without rales, wheezing or rhonchi  ABDOMEN: Soft, non-tender, non-distended MUSCULOSKELETAL:  No edema; No deformity  SKIN: Warm and dry NEUROLOGIC:  Alert and oriented x 3 PSYCHIATRIC:  Normal affect   ASSESSMENT:    PAF (paroxysmal atrial fibrillation) (HCC) OP TEE CV Aug 2020- holding NSR  Anticoagulated Eliquis 5 mg BID  NICM (nonischemic cardiomyopathy) ([place]) Presumably secondary to AF/flutter.  EF improved from 20% to 45% after TEE CV. No significant CAD at cath July 2020.  History of melanoma Stable followed at [facility]:    No change in Rx- F/U Dr Stanford Breed in 6 months. Labs work drawn today at PCPs office.    Medication Adjustments/Labs and Tests Ordered: Current medicines are reviewed at length with the patient today.   Concerns regarding medicines are outlined above.  Orders Placed This Encounter  Procedures  . EKG 12-Lead   No orders of the defined types were placed in this encounter.   Patient Instructions  Medication Instructions:  No changes *If you need a refill on your cardiac medications before your next appointment, please call your pharmacy*   Lab Work: No labs If you have labs (blood work) drawn today and your tests are completely normal, you will receive your results only by: Marland Kitchen MyChart Message (if you have MyChart) OR . A paper copy in the mail If you have any lab test that is abnormal or we need to change your treatment, we will call you to review the results.   Testing/Procedures: No Testing    Follow-Up: At [facility], you and your health needs are our priority.  As part of our continuing mission to provide you with exceptional heart care, we have created designated Provider Care Teams.  These Care Teams include your primary Cardiologist (physician) and Advanced Practice Providers (APPs -  Physician Assistants and Nurse Practitioners) who all work together to provide you with the care you need, when you need it.  Your next appointment:   6 month(s)  The format for your next appointment:   In Person  Provider:   Kirk Ruths, MD       Signed, Monia Pouch, [place]  07/05/2020 [place]

## 2020-07-05 NOTE — Progress Notes (Signed)
Cardiology Office Note:    Date:  07/05/2020   ID:  Raymond Hurley., DOB October 09, 1931, MRN [id number]  PCP:  Lajean Manes, MD  Cardiologist:  No primary care provider on file.  Electrophysiologist:  None   Referring MD: Lajean Manes, MD   No chief complaint on file.   History of Present Illness:    Raymond Hurley. is a pleasant 86 y.o. male with a hx of PAF and NICM.  Patient has a history of an EF of 20% in July 2020.  Catheterization then showed no significant coronary disease.  It was felt his symptoms were secondary to atrial fibrillation atrial flutter.  He ultimately underwent TEE cardioversion in August 2020 to sinus rhythm.  He declined EP evaluation for flutter therapy.  Follow-up echocardiogram November 2020 showed improvement in his ejection fraction to 45%.  He saw Dr. Stanford Breed in May 2021 and was doing well.  He is in the office today for routine follow up.  He asked about Systems analyst, he has been a Insurance underwriter for many years.   Since we saw him last he is done well. He and his wife are staying at their [place] in [place].  He denies any tachycardia or palpitations.  EKG in the office today shows sinus rhythm with a heart rate of 60.  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  They will require a 24-hour Holter, a stress test, sleep study, and echocardiogram, and thyroid function test.  He has 30 days to complete this.  I explained that it was unlikely we are going to be able to complete all that in 30 days.  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   Past Medical History:  Diagnosis Date  . Melanoma ([place])   . Vitamin B 12 deficiency     Past Surgical History:  Procedure Laterality Date  . CARDIOVERSION N/A 06/02/2019   Procedure: CARDIOVERSION;  Surgeon: Jerline Pain, MD;  Location: [facility] ENDOSCOPY;  Service: Cardiovascular;  Laterality: N/A;  . RIGHT/LEFT HEART CATH AND CORONARY  ANGIOGRAPHY N/A 04/27/2019   Procedure: RIGHT/LEFT HEART CATH AND CORONARY ANGIOGRAPHY;  Surgeon: Jolaine Artist, MD;  Location: [place] CV LAB;  Service: Cardiovascular;  Laterality: N/A;  . TEE WITHOUT CARDIOVERSION N/A 06/02/2019   Procedure: TRANSESOPHAGEAL ECHOCARDIOGRAM (TEE);  Surgeon: Jerline Pain, MD;  Location: [facility] ENDOSCOPY;  Service: Cardiovascular;  Laterality: N/A;    Current Medications: Current Meds  Medication Sig  . Cyanocobalamin (B-12 PO) Take by mouth.  Mariane Baumgarten Calcium (STOOL SOFTENER PO) Take by mouth.  Arne Cleveland 5 MG TABS tablet TAKE ONE TABLET TWICE DAILY  . fluticasone (FLONASE) 50 MCG/ACT nasal spray Place into both nostrils daily.  . Hydrocortisone (ZJQBHALPF-79 EX) Apply 1 application topically 3 (three) times daily as needed (itching/sking irritation.).  Marland Kitchen metoprolol succinate (TOPROL-XL) 25 MG 24 hr tablet Take 1 tablet (25 mg total) by mouth every evening.  . sacubitril-valsartan (ENTRESTO) 24-26 MG Take 1 tablet by mouth 2 (two) times daily.  Marland Kitchen tetrahydrozoline 0.05 % ophthalmic solution Place 1-2 drops into both eyes 3 (three) times daily as needed (dry/irritated eyes.).  Marland Kitchen triamcinolone cream (KENALOG) 0.1 % Apply topically 2 (two) times daily.     Allergies:   Povidone-iodine, Tape, and Bee venom   Social History   Socioeconomic History  . Marital status: Married    Spouse name: Not on file  . Number  of children: Not on file  . Years of education: Not on file  . Highest education level: Not on file  Occupational History  . Not on file  Tobacco Use  . Smoking status: Never Smoker  . Smokeless tobacco: Never Used  Vaping Use  . Vaping Use: Never used  Substance and Sexual Activity  . Alcohol use: Yes    Alcohol/week: 7.0 standard drinks    Types: 7 Glasses of wine per week  . Drug use: Never  . Sexual activity: Not on file  Other Topics Concern  . Not on file  Social History Narrative  . Not on file   Social Determinants of  Health   Financial Resource Strain:   . Difficulty of Paying Living Expenses: Not on file  Food Insecurity:   . Worried About Charity fundraiser in the Last Year: Not on file  . Ran Out of Food in the Last Year: Not on file  Transportation Needs:   . Lack of Transportation (Medical): Not on file  . Lack of Transportation (Non-Medical): Not on file  Physical Activity:   . Days of Exercise per Week: Not on file  . Minutes of Exercise per Session: Not on file  Stress:   . Feeling of Stress : Not on file  Social Connections:   . Frequency of Communication with Friends and Family: Not on file  . Frequency of Social Gatherings with Friends and Family: Not on file  . Attends Religious Services: Not on file  . Active Member of Clubs or Organizations: Not on file  . Attends Archivist Meetings: Not on file  . Marital Status: Not on file     Family History: The patient's family history is not on file.  ROS:   Please see the history of present illness.     All other systems reviewed and are negative.  EKGs/Labs/Other Studies Reviewed:    The following studies were reviewed today: Echo 09/07/2019-- IMPRESSIONS    1. Left ventricular ejection fraction, by visual estimation, is 45%. The  left ventricle has mildly decreased function. There is mildly increased  left ventricular hypertrophy.  2. The left ventricle demonstrates global hypokinesis.  3. Left atrial size was normal.  4. Right atrial size was normal.  5. The mitral valve is normal in structure. Trace mitral valve  regurgitation. No evidence of mitral stenosis.  6. The tricuspid valve is normal in structure. Tricuspid valve  regurgitation is trivial.  7. The aortic valve is tricuspid. Aortic valve regurgitation is not  visualized. No evidence of aortic valve sclerosis or stenosis.  8. There is mild dilatation of the ascending aorta measuring 38 mm.  9. Global right ventricle has normal systolic  function.The right  ventricular size is normal. No increase in right ventricular wall  thickness.  10. The inferior vena cava is normal in size with greater than 50%  respiratory variability, suggesting right atrial pressure of 3 mmHg.  11. The tricuspid regurgitant velocity is 2.12 m/s, and with an assumed  right atrial pressure of 3 mmHg, the estimated right ventricular systolic  pressure is normal at 21.0 mmHg.   EKG:  EKG is  ordered today.  The ekg ordered today demonstrates NSR, HR 60, LAD, IVCD, QTc 430  Recent Labs: 03/03/2020: BUN 14; Creatinine, Ser 1.19; Potassium 5.0; Sodium 144  Recent Lipid Panel No results found for: CHOL, TRIG, HDL, CHOLHDL, VLDL, LDLCALC, LDLDIRECT  Physical Exam:    VS:  BP 136/80  Pulse 85   Temp (!) 97.2 F (36.2 C)   Ht 5\' 8"  (1.727 m)   Wt 209 lb 9.6 oz (95.1 kg)   SpO2 97%   BMI 31.87 kg/m     Wt Readings from Last 3 Encounters:  07/05/20 209 lb 9.6 oz (95.1 kg)  06/13/20 212 lb (96.2 kg)  03/03/20 212 lb 3.2 oz (96.3 kg)     GEN:  Well nourished, well developed in no acute distress HEENT: Normal NECK: No JVD CARDIAC: RRR, no murmurs, rubs, gallops RESPIRATORY:  Clear to auscultation without rales, wheezing or rhonchi  ABDOMEN: Soft, non-tender, non-distended MUSCULOSKELETAL:  No edema; No deformity  SKIN: Warm and dry NEUROLOGIC:  Alert and oriented x 3 PSYCHIATRIC:  Normal affect   ASSESSMENT:    PAF (paroxysmal atrial fibrillation) (HCC) OP TEE CV Aug 2020- holding NSR  Anticoagulated Eliquis 5 mg BID  NICM (nonischemic cardiomyopathy) ([place]) Presumably secondary to AF/flutter.  EF improved from 20% to 45% after TEE CV. No significant CAD at cath July 2020.  History of melanoma Stable followed at [facility]:    No change in Rx- F/U Dr Stanford Breed in 6 months. Labs work drawn today at PCPs office.    Medication Adjustments/Labs and Tests Ordered: Current medicines are reviewed at length with the patient today.   Concerns regarding medicines are outlined above.  No orders of the defined types were placed in this encounter.  No orders of the defined types were placed in this encounter.   There are no Patient Instructions on file for this visit.   Signed, Kerin Ransom, PA-C  07/05/2020 4:02 PM    [facility]

## 2020-07-05 NOTE — Assessment & Plan Note (Signed)
Presumably secondary to AF/flutter.  EF improved from 20% to 45% after TEE CV. No significant CAD at cath July 2020.

## 2020-07-05 NOTE — Patient Instructions (Signed)
Medication Instructions:  No changes *If you need a refill on your cardiac medications before your next appointment, please call your pharmacy*   Lab Work: No labs If you have labs (blood work) drawn today and your tests are completely normal, you will receive your results only by: Marland Kitchen MyChart Message (if you have MyChart) OR . A paper copy in the mail If you have any lab test that is abnormal or we need to change your treatment, we will call you to review the results.   Testing/Procedures: No Testing    Follow-Up: At [facility], you and your health needs are our priority.  As part of our continuing mission to provide you with exceptional heart care, we have created designated Provider Care Teams.  These Care Teams include your primary Cardiologist (physician) and Advanced Practice Providers (APPs -  Physician Assistants and Nurse Practitioners) who all work together to provide you with the care you need, when you need it.  Your next appointment:   6 month(s)  The format for your next appointment:   In Person  Provider:   Kirk Ruths, MD

## 2020-07-05 NOTE — Assessment & Plan Note (Signed)
OP TEE CV Aug 2020- holding NSR

## 2020-07-05 NOTE — Assessment & Plan Note (Signed)
Stable followed at [facility]

## 2020-07-05 NOTE — Assessment & Plan Note (Signed)
Eliquis 5 mg BID 

## 2020-07-25 DIAGNOSIS — L570 Actinic keratosis: Secondary | ICD-10-CM | POA: Diagnosis not present

## 2020-07-25 DIAGNOSIS — L812 Freckles: Secondary | ICD-10-CM | POA: Diagnosis not present

## 2020-07-25 DIAGNOSIS — D1801 Hemangioma of skin and subcutaneous tissue: Secondary | ICD-10-CM | POA: Diagnosis not present

## 2020-07-25 DIAGNOSIS — L720 Epidermal cyst: Secondary | ICD-10-CM | POA: Diagnosis not present

## 2020-07-25 DIAGNOSIS — L821 Other seborrheic keratosis: Secondary | ICD-10-CM | POA: Diagnosis not present

## 2020-07-25 DIAGNOSIS — Z85820 Personal history of malignant melanoma of skin: Secondary | ICD-10-CM | POA: Diagnosis not present

## 2020-07-25 DIAGNOSIS — Z85828 Personal history of other malignant neoplasm of skin: Secondary | ICD-10-CM | POA: Diagnosis not present

## 2020-08-02 DIAGNOSIS — C799 Secondary malignant neoplasm of unspecified site: Secondary | ICD-10-CM | POA: Diagnosis not present

## 2020-08-02 DIAGNOSIS — I420 Dilated cardiomyopathy: Secondary | ICD-10-CM | POA: Diagnosis not present

## 2020-08-02 DIAGNOSIS — Z8719 Personal history of other diseases of the digestive system: Secondary | ICD-10-CM | POA: Diagnosis not present

## 2020-08-02 DIAGNOSIS — C7931 Secondary malignant neoplasm of brain: Secondary | ICD-10-CM | POA: Diagnosis not present

## 2020-08-02 DIAGNOSIS — I472 Ventricular tachycardia: Secondary | ICD-10-CM | POA: Diagnosis not present

## 2020-08-02 DIAGNOSIS — C779 Secondary and unspecified malignant neoplasm of lymph node, unspecified: Secondary | ICD-10-CM | POA: Diagnosis not present

## 2020-08-02 DIAGNOSIS — C434 Malignant melanoma of scalp and neck: Secondary | ICD-10-CM | POA: Diagnosis not present

## 2020-08-02 DIAGNOSIS — D539 Nutritional anemia, unspecified: Secondary | ICD-10-CM | POA: Diagnosis not present

## 2020-08-02 DIAGNOSIS — C4339 Malignant melanoma of other parts of face: Secondary | ICD-10-CM | POA: Diagnosis not present

## 2020-08-02 DIAGNOSIS — C786 Secondary malignant neoplasm of retroperitoneum and peritoneum: Secondary | ICD-10-CM | POA: Diagnosis not present

## 2020-08-02 DIAGNOSIS — D508 Other iron deficiency anemias: Secondary | ICD-10-CM | POA: Diagnosis not present

## 2020-08-02 DIAGNOSIS — D72819 Decreased white blood cell count, unspecified: Secondary | ICD-10-CM | POA: Diagnosis not present

## 2020-08-02 DIAGNOSIS — E538 Deficiency of other specified B group vitamins: Secondary | ICD-10-CM | POA: Diagnosis not present

## 2020-08-02 DIAGNOSIS — C439 Malignant melanoma of skin, unspecified: Secondary | ICD-10-CM | POA: Diagnosis not present

## 2020-08-02 DIAGNOSIS — I4892 Unspecified atrial flutter: Secondary | ICD-10-CM | POA: Diagnosis not present

## 2020-08-04 DIAGNOSIS — L509 Urticaria, unspecified: Secondary | ICD-10-CM | POA: Diagnosis not present

## 2020-08-09 DIAGNOSIS — Z23 Encounter for immunization: Secondary | ICD-10-CM | POA: Diagnosis not present

## 2020-08-17 ENCOUNTER — Other Ambulatory Visit: Payer: Self-pay | Admitting: Cardiology

## 2020-08-17 DIAGNOSIS — I4892 Unspecified atrial flutter: Secondary | ICD-10-CM

## 2020-08-18 DIAGNOSIS — R21 Rash and other nonspecific skin eruption: Secondary | ICD-10-CM | POA: Diagnosis not present

## 2020-09-05 DIAGNOSIS — Z85820 Personal history of malignant melanoma of skin: Secondary | ICD-10-CM | POA: Diagnosis not present

## 2020-09-05 DIAGNOSIS — Z85828 Personal history of other malignant neoplasm of skin: Secondary | ICD-10-CM | POA: Diagnosis not present

## 2020-09-05 DIAGNOSIS — L509 Urticaria, unspecified: Secondary | ICD-10-CM | POA: Diagnosis not present

## 2020-10-14 ENCOUNTER — Inpatient Hospital Stay (HOSPITAL_COMMUNITY): Payer: Medicare Other

## 2020-10-14 ENCOUNTER — Other Ambulatory Visit: Payer: Self-pay

## 2020-10-14 ENCOUNTER — Inpatient Hospital Stay (HOSPITAL_COMMUNITY)
Admission: EM | Admit: 2020-10-14 | Discharge: 2020-11-10 | DRG: 871 | Disposition: A | Discharge status: Rehabilitation Facility | Payer: Medicare Other | Attending: Internal Medicine | Admitting: Internal Medicine

## 2020-10-14 ENCOUNTER — Emergency Department (HOSPITAL_COMMUNITY): Payer: Medicare Other

## 2020-10-14 ENCOUNTER — Encounter (HOSPITAL_COMMUNITY): Payer: Self-pay | Admitting: Internal Medicine

## 2020-10-14 DIAGNOSIS — K567 Ileus, unspecified: Secondary | ICD-10-CM | POA: Diagnosis not present

## 2020-10-14 DIAGNOSIS — A4189 Other specified sepsis: Principal | ICD-10-CM | POA: Diagnosis present

## 2020-10-14 DIAGNOSIS — Z7189 Other specified counseling: Secondary | ICD-10-CM | POA: Diagnosis not present

## 2020-10-14 DIAGNOSIS — E869 Volume depletion, unspecified: Secondary | ICD-10-CM | POA: Diagnosis not present

## 2020-10-14 DIAGNOSIS — R6521 Severe sepsis with septic shock: Secondary | ICD-10-CM | POA: Diagnosis not present

## 2020-10-14 DIAGNOSIS — I480 Paroxysmal atrial fibrillation: Secondary | ICD-10-CM | POA: Diagnosis present

## 2020-10-14 DIAGNOSIS — I420 Dilated cardiomyopathy: Secondary | ICD-10-CM | POA: Diagnosis present

## 2020-10-14 DIAGNOSIS — Z9221 Personal history of antineoplastic chemotherapy: Secondary | ICD-10-CM

## 2020-10-14 DIAGNOSIS — N281 Cyst of kidney, acquired: Secondary | ICD-10-CM | POA: Diagnosis not present

## 2020-10-14 DIAGNOSIS — I132 Hypertensive heart and chronic kidney disease with heart failure and with stage 5 chronic kidney disease, or end stage renal disease: Secondary | ICD-10-CM | POA: Diagnosis present

## 2020-10-14 DIAGNOSIS — I5042 Chronic combined systolic (congestive) and diastolic (congestive) heart failure: Secondary | ICD-10-CM | POA: Diagnosis present

## 2020-10-14 DIAGNOSIS — J90 Pleural effusion, not elsewhere classified: Secondary | ICD-10-CM | POA: Diagnosis not present

## 2020-10-14 DIAGNOSIS — C7889 Secondary malignant neoplasm of other digestive organs: Secondary | ICD-10-CM | POA: Diagnosis present

## 2020-10-14 DIAGNOSIS — I5022 Chronic systolic (congestive) heart failure: Secondary | ICD-10-CM | POA: Diagnosis not present

## 2020-10-14 DIAGNOSIS — J189 Pneumonia, unspecified organism: Secondary | ICD-10-CM | POA: Diagnosis not present

## 2020-10-14 DIAGNOSIS — I2510 Atherosclerotic heart disease of native coronary artery without angina pectoris: Secondary | ICD-10-CM | POA: Diagnosis present

## 2020-10-14 DIAGNOSIS — I953 Hypotension of hemodialysis: Secondary | ICD-10-CM | POA: Diagnosis not present

## 2020-10-14 DIAGNOSIS — J9601 Acute respiratory failure with hypoxia: Secondary | ICD-10-CM | POA: Diagnosis present

## 2020-10-14 DIAGNOSIS — U071 COVID-19: Secondary | ICD-10-CM | POA: Diagnosis present

## 2020-10-14 DIAGNOSIS — R579 Shock, unspecified: Secondary | ICD-10-CM | POA: Diagnosis not present

## 2020-10-14 DIAGNOSIS — E861 Hypovolemia: Secondary | ICD-10-CM | POA: Diagnosis present

## 2020-10-14 DIAGNOSIS — R29810 Facial weakness: Secondary | ICD-10-CM | POA: Diagnosis not present

## 2020-10-14 DIAGNOSIS — D638 Anemia in other chronic diseases classified elsewhere: Secondary | ICD-10-CM | POA: Diagnosis not present

## 2020-10-14 DIAGNOSIS — Z79899 Other long term (current) drug therapy: Secondary | ICD-10-CM | POA: Diagnosis not present

## 2020-10-14 DIAGNOSIS — Z8701 Personal history of pneumonia (recurrent): Secondary | ICD-10-CM | POA: Diagnosis not present

## 2020-10-14 DIAGNOSIS — R531 Weakness: Secondary | ICD-10-CM | POA: Diagnosis not present

## 2020-10-14 DIAGNOSIS — Z7901 Long term (current) use of anticoagulants: Secondary | ICD-10-CM | POA: Diagnosis not present

## 2020-10-14 DIAGNOSIS — G459 Transient cerebral ischemic attack, unspecified: Secondary | ICD-10-CM

## 2020-10-14 DIAGNOSIS — R6520 Severe sepsis without septic shock: Secondary | ICD-10-CM | POA: Diagnosis present

## 2020-10-14 DIAGNOSIS — R197 Diarrhea, unspecified: Secondary | ICD-10-CM

## 2020-10-14 DIAGNOSIS — D72829 Elevated white blood cell count, unspecified: Secondary | ICD-10-CM | POA: Diagnosis not present

## 2020-10-14 DIAGNOSIS — A0839 Other viral enteritis: Secondary | ICD-10-CM | POA: Diagnosis present

## 2020-10-14 DIAGNOSIS — Z4659 Encounter for fitting and adjustment of other gastrointestinal appliance and device: Secondary | ICD-10-CM

## 2020-10-14 DIAGNOSIS — D696 Thrombocytopenia, unspecified: Secondary | ICD-10-CM

## 2020-10-14 DIAGNOSIS — I493 Ventricular premature depolarization: Secondary | ICD-10-CM | POA: Diagnosis not present

## 2020-10-14 DIAGNOSIS — K5909 Other constipation: Secondary | ICD-10-CM | POA: Diagnosis not present

## 2020-10-14 DIAGNOSIS — N170 Acute kidney failure with tubular necrosis: Secondary | ICD-10-CM | POA: Diagnosis present

## 2020-10-14 DIAGNOSIS — T380X5A Adverse effect of glucocorticoids and synthetic analogues, initial encounter: Secondary | ICD-10-CM | POA: Diagnosis present

## 2020-10-14 DIAGNOSIS — K921 Melena: Secondary | ICD-10-CM | POA: Diagnosis not present

## 2020-10-14 DIAGNOSIS — A419 Sepsis, unspecified organism: Secondary | ICD-10-CM | POA: Diagnosis not present

## 2020-10-14 DIAGNOSIS — D62 Acute posthemorrhagic anemia: Secondary | ICD-10-CM | POA: Diagnosis not present

## 2020-10-14 DIAGNOSIS — G9341 Metabolic encephalopathy: Secondary | ICD-10-CM | POA: Diagnosis not present

## 2020-10-14 DIAGNOSIS — K6389 Other specified diseases of intestine: Secondary | ICD-10-CM | POA: Diagnosis not present

## 2020-10-14 DIAGNOSIS — J1282 Pneumonia due to coronavirus disease 2019: Secondary | ICD-10-CM | POA: Diagnosis not present

## 2020-10-14 DIAGNOSIS — I428 Other cardiomyopathies: Secondary | ICD-10-CM | POA: Diagnosis present

## 2020-10-14 DIAGNOSIS — R5381 Other malaise: Secondary | ICD-10-CM | POA: Diagnosis not present

## 2020-10-14 DIAGNOSIS — R778 Other specified abnormalities of plasma proteins: Secondary | ICD-10-CM | POA: Diagnosis not present

## 2020-10-14 DIAGNOSIS — E46 Unspecified protein-calorie malnutrition: Secondary | ICD-10-CM | POA: Diagnosis present

## 2020-10-14 DIAGNOSIS — Z4901 Encounter for fitting and adjustment of extracorporeal dialysis catheter: Secondary | ICD-10-CM | POA: Diagnosis not present

## 2020-10-14 DIAGNOSIS — R404 Transient alteration of awareness: Secondary | ICD-10-CM | POA: Diagnosis not present

## 2020-10-14 DIAGNOSIS — N2581 Secondary hyperparathyroidism of renal origin: Secondary | ICD-10-CM | POA: Diagnosis present

## 2020-10-14 DIAGNOSIS — E860 Dehydration: Secondary | ICD-10-CM | POA: Diagnosis not present

## 2020-10-14 DIAGNOSIS — Z888 Allergy status to other drugs, medicaments and biological substances status: Secondary | ICD-10-CM

## 2020-10-14 DIAGNOSIS — R7989 Other specified abnormal findings of blood chemistry: Secondary | ICD-10-CM | POA: Diagnosis present

## 2020-10-14 DIAGNOSIS — R0600 Dyspnea, unspecified: Secondary | ICD-10-CM | POA: Diagnosis not present

## 2020-10-14 DIAGNOSIS — I700 Atherosclerosis of aorta: Secondary | ICD-10-CM | POA: Diagnosis not present

## 2020-10-14 DIAGNOSIS — E876 Hypokalemia: Secondary | ICD-10-CM | POA: Diagnosis not present

## 2020-10-14 DIAGNOSIS — E538 Deficiency of other specified B group vitamins: Secondary | ICD-10-CM | POA: Diagnosis present

## 2020-10-14 DIAGNOSIS — K8689 Other specified diseases of pancreas: Secondary | ICD-10-CM | POA: Diagnosis not present

## 2020-10-14 DIAGNOSIS — N179 Acute kidney failure, unspecified: Secondary | ICD-10-CM

## 2020-10-14 DIAGNOSIS — K8020 Calculus of gallbladder without cholecystitis without obstruction: Secondary | ICD-10-CM | POA: Diagnosis not present

## 2020-10-14 DIAGNOSIS — K6812 Psoas muscle abscess: Secondary | ICD-10-CM | POA: Diagnosis not present

## 2020-10-14 DIAGNOSIS — D649 Anemia, unspecified: Secondary | ICD-10-CM | POA: Diagnosis not present

## 2020-10-14 DIAGNOSIS — I639 Cerebral infarction, unspecified: Secondary | ICD-10-CM | POA: Diagnosis not present

## 2020-10-14 DIAGNOSIS — E871 Hypo-osmolality and hyponatremia: Secondary | ICD-10-CM | POA: Diagnosis present

## 2020-10-14 DIAGNOSIS — R109 Unspecified abdominal pain: Secondary | ICD-10-CM

## 2020-10-14 DIAGNOSIS — R0602 Shortness of breath: Secondary | ICD-10-CM

## 2020-10-14 DIAGNOSIS — I959 Hypotension, unspecified: Secondary | ICD-10-CM | POA: Diagnosis not present

## 2020-10-14 DIAGNOSIS — Z66 Do not resuscitate: Secondary | ICD-10-CM | POA: Diagnosis not present

## 2020-10-14 DIAGNOSIS — R918 Other nonspecific abnormal finding of lung field: Secondary | ICD-10-CM | POA: Diagnosis not present

## 2020-10-14 DIAGNOSIS — J690 Pneumonitis due to inhalation of food and vomit: Secondary | ICD-10-CM | POA: Diagnosis present

## 2020-10-14 DIAGNOSIS — Z8616 Personal history of COVID-19: Secondary | ICD-10-CM | POA: Diagnosis not present

## 2020-10-14 DIAGNOSIS — I4892 Unspecified atrial flutter: Secondary | ICD-10-CM | POA: Diagnosis not present

## 2020-10-14 DIAGNOSIS — I4819 Other persistent atrial fibrillation: Secondary | ICD-10-CM | POA: Diagnosis present

## 2020-10-14 DIAGNOSIS — R54 Age-related physical debility: Secondary | ICD-10-CM | POA: Diagnosis present

## 2020-10-14 DIAGNOSIS — I878 Other specified disorders of veins: Secondary | ICD-10-CM | POA: Diagnosis not present

## 2020-10-14 DIAGNOSIS — J9811 Atelectasis: Secondary | ICD-10-CM | POA: Diagnosis not present

## 2020-10-14 DIAGNOSIS — Z85820 Personal history of malignant melanoma of skin: Secondary | ICD-10-CM | POA: Diagnosis not present

## 2020-10-14 DIAGNOSIS — J9600 Acute respiratory failure, unspecified whether with hypoxia or hypercapnia: Secondary | ICD-10-CM

## 2020-10-14 DIAGNOSIS — R001 Bradycardia, unspecified: Secondary | ICD-10-CM | POA: Diagnosis not present

## 2020-10-14 DIAGNOSIS — R1310 Dysphagia, unspecified: Secondary | ICD-10-CM | POA: Diagnosis not present

## 2020-10-14 DIAGNOSIS — D6959 Other secondary thrombocytopenia: Secondary | ICD-10-CM | POA: Diagnosis present

## 2020-10-14 DIAGNOSIS — Z781 Physical restraint status: Secondary | ICD-10-CM

## 2020-10-14 DIAGNOSIS — I4891 Unspecified atrial fibrillation: Secondary | ICD-10-CM | POA: Diagnosis not present

## 2020-10-14 DIAGNOSIS — R739 Hyperglycemia, unspecified: Secondary | ICD-10-CM | POA: Diagnosis present

## 2020-10-14 DIAGNOSIS — E43 Unspecified severe protein-calorie malnutrition: Secondary | ICD-10-CM | POA: Diagnosis present

## 2020-10-14 DIAGNOSIS — D72828 Other elevated white blood cell count: Secondary | ICD-10-CM | POA: Diagnosis present

## 2020-10-14 DIAGNOSIS — R935 Abnormal findings on diagnostic imaging of other abdominal regions, including retroperitoneum: Secondary | ICD-10-CM | POA: Diagnosis not present

## 2020-10-14 DIAGNOSIS — Z4682 Encounter for fitting and adjustment of non-vascular catheter: Secondary | ICD-10-CM | POA: Diagnosis not present

## 2020-10-14 DIAGNOSIS — B349 Viral infection, unspecified: Secondary | ICD-10-CM | POA: Diagnosis not present

## 2020-10-14 DIAGNOSIS — L89153 Pressure ulcer of sacral region, stage 3: Secondary | ICD-10-CM | POA: Diagnosis not present

## 2020-10-14 DIAGNOSIS — Z452 Encounter for adjustment and management of vascular access device: Secondary | ICD-10-CM | POA: Diagnosis not present

## 2020-10-14 DIAGNOSIS — K5900 Constipation, unspecified: Secondary | ICD-10-CM

## 2020-10-14 DIAGNOSIS — K859 Acute pancreatitis without necrosis or infection, unspecified: Secondary | ICD-10-CM | POA: Diagnosis not present

## 2020-10-14 DIAGNOSIS — R140 Abdominal distension (gaseous): Secondary | ICD-10-CM

## 2020-10-14 DIAGNOSIS — M160 Bilateral primary osteoarthritis of hip: Secondary | ICD-10-CM | POA: Diagnosis not present

## 2020-10-14 DIAGNOSIS — J9690 Respiratory failure, unspecified, unspecified whether with hypoxia or hypercapnia: Secondary | ICD-10-CM | POA: Diagnosis not present

## 2020-10-14 DIAGNOSIS — E872 Acidosis: Secondary | ICD-10-CM | POA: Diagnosis not present

## 2020-10-14 DIAGNOSIS — K5901 Slow transit constipation: Secondary | ICD-10-CM | POA: Diagnosis present

## 2020-10-14 DIAGNOSIS — I5032 Chronic diastolic (congestive) heart failure: Secondary | ICD-10-CM | POA: Diagnosis not present

## 2020-10-14 DIAGNOSIS — Z808 Family history of malignant neoplasm of other organs or systems: Secondary | ICD-10-CM

## 2020-10-14 DIAGNOSIS — Z992 Dependence on renal dialysis: Secondary | ICD-10-CM | POA: Diagnosis not present

## 2020-10-14 DIAGNOSIS — D6489 Other specified anemias: Secondary | ICD-10-CM | POA: Diagnosis present

## 2020-10-14 DIAGNOSIS — N186 End stage renal disease: Secondary | ICD-10-CM | POA: Diagnosis present

## 2020-10-14 DIAGNOSIS — D539 Nutritional anemia, unspecified: Secondary | ICD-10-CM | POA: Diagnosis present

## 2020-10-14 DIAGNOSIS — Z91030 Bee allergy status: Secondary | ICD-10-CM

## 2020-10-14 DIAGNOSIS — R29818 Other symptoms and signs involving the nervous system: Secondary | ICD-10-CM | POA: Diagnosis not present

## 2020-10-14 DIAGNOSIS — R4781 Slurred speech: Secondary | ICD-10-CM | POA: Diagnosis not present

## 2020-10-14 DIAGNOSIS — L89310 Pressure ulcer of right buttock, unstageable: Secondary | ICD-10-CM | POA: Diagnosis present

## 2020-10-14 DIAGNOSIS — R34 Anuria and oliguria: Secondary | ICD-10-CM | POA: Diagnosis present

## 2020-10-14 DIAGNOSIS — I517 Cardiomegaly: Secondary | ICD-10-CM | POA: Diagnosis not present

## 2020-10-14 DIAGNOSIS — R42 Dizziness and giddiness: Secondary | ICD-10-CM | POA: Diagnosis not present

## 2020-10-14 DIAGNOSIS — E785 Hyperlipidemia, unspecified: Secondary | ICD-10-CM | POA: Diagnosis present

## 2020-10-14 DIAGNOSIS — Z515 Encounter for palliative care: Secondary | ICD-10-CM | POA: Diagnosis not present

## 2020-10-14 HISTORY — DX: Unspecified atrial fibrillation: I48.91

## 2020-10-14 HISTORY — DX: Heart failure, unspecified: I50.9

## 2020-10-14 LAB — I-STAT CHEM 8, ED
BUN: 59 mg/dL — ABNORMAL HIGH (ref 8–23)
Calcium, Ion: 0.55 mmol/L — CL (ref 1.15–1.40)
Chloride: 96 mmol/L — ABNORMAL LOW (ref 98–111)
Creatinine, Ser: 5.6 mg/dL — ABNORMAL HIGH (ref 0.61–1.24)
Glucose, Bld: 138 mg/dL — ABNORMAL HIGH (ref 70–99)
HCT: 40 % (ref 39.0–52.0)
Hemoglobin: 13.6 g/dL (ref 13.0–17.0)
Potassium: 3.8 mmol/L (ref 3.5–5.1)
Sodium: 130 mmol/L — ABNORMAL LOW (ref 135–145)
TCO2: 20 mmol/L — ABNORMAL LOW (ref 22–32)

## 2020-10-14 LAB — DIFFERENTIAL
Abs Immature Granulocytes: 1.15 10*3/uL — ABNORMAL HIGH (ref 0.00–0.07)
Basophils Absolute: 0 10*3/uL (ref 0.0–0.1)
Basophils Relative: 0 %
Eosinophils Absolute: 0 10*3/uL (ref 0.0–0.5)
Eosinophils Relative: 0 %
Immature Granulocytes: 4 %
Lymphocytes Relative: 2 %
Lymphs Abs: 0.7 10*3/uL (ref 0.7–4.0)
Monocytes Absolute: 9.6 10*3/uL — ABNORMAL HIGH (ref 0.1–1.0)
Monocytes Relative: 31 %
Neutro Abs: 19.2 10*3/uL — ABNORMAL HIGH (ref 1.7–7.7)
Neutrophils Relative %: 63 %

## 2020-10-14 LAB — BASIC METABOLIC PANEL
Anion gap: 13 (ref 5–15)
BUN: 56 mg/dL — ABNORMAL HIGH (ref 8–23)
CO2: 19 mmol/L — ABNORMAL LOW (ref 22–32)
Calcium: 4.5 mg/dL — CL (ref 8.9–10.3)
Chloride: 97 mmol/L — ABNORMAL LOW (ref 98–111)
Creatinine, Ser: 5.08 mg/dL — ABNORMAL HIGH (ref 0.61–1.24)
GFR, Estimated: 10 mL/min — ABNORMAL LOW (ref >60)
Glucose, Bld: 103 mg/dL — ABNORMAL HIGH (ref 70–99)
Potassium: 3.4 mmol/L — ABNORMAL LOW (ref 3.5–5.1)
Sodium: 129 mmol/L — ABNORMAL LOW (ref 135–145)

## 2020-10-14 LAB — TROPONIN I (HIGH SENSITIVITY)
Troponin I (High Sensitivity): 50 ng/L — ABNORMAL HIGH (ref <18)
Troponin I (High Sensitivity): 61 ng/L — ABNORMAL HIGH (ref <18)

## 2020-10-14 LAB — ABO/RH: ABO/RH(D): O POS

## 2020-10-14 LAB — CBC
HCT: 36.9 % — ABNORMAL LOW (ref 39.0–52.0)
Hemoglobin: 12.5 g/dL — ABNORMAL LOW (ref 13.0–17.0)
MCH: 36.5 pg — ABNORMAL HIGH (ref 26.0–34.0)
MCHC: 33.9 g/dL (ref 30.0–36.0)
MCV: 107.9 fL — ABNORMAL HIGH (ref 80.0–100.0)
Platelets: 162 10*3/uL (ref 150–400)
RBC: 3.42 MIL/uL — ABNORMAL LOW (ref 4.22–5.81)
RDW: 14.3 % (ref 11.5–15.5)
WBC: 30.6 10*3/uL — ABNORMAL HIGH (ref 4.0–10.5)
nRBC: 0 % (ref 0.0–0.2)

## 2020-10-14 LAB — GASTROINTESTINAL PANEL BY PCR, STOOL (REPLACES STOOL CULTURE)

## 2020-10-14 LAB — COMPREHENSIVE METABOLIC PANEL
ALT: 68 U/L — ABNORMAL HIGH (ref 0–44)
AST: 86 U/L — ABNORMAL HIGH (ref 15–41)
Albumin: 2.8 g/dL — ABNORMAL LOW (ref 3.5–5.0)
Alkaline Phosphatase: 56 U/L (ref 38–126)
Anion gap: 16 — ABNORMAL HIGH (ref 5–15)
BUN: 57 mg/dL — ABNORMAL HIGH (ref 8–23)
CO2: 18 mmol/L — ABNORMAL LOW (ref 22–32)
Calcium: 4.5 mg/dL — CL (ref 8.9–10.3)
Chloride: 96 mmol/L — ABNORMAL LOW (ref 98–111)
Creatinine, Ser: 5.89 mg/dL — ABNORMAL HIGH (ref 0.61–1.24)
GFR, Estimated: 9 mL/min — ABNORMAL LOW (ref >60)
Glucose, Bld: 145 mg/dL — ABNORMAL HIGH (ref 70–99)
Potassium: 3.8 mmol/L (ref 3.5–5.1)
Sodium: 130 mmol/L — ABNORMAL LOW (ref 135–145)
Total Bilirubin: 0.9 mg/dL (ref 0.3–1.2)
Total Protein: 5.8 g/dL — ABNORMAL LOW (ref 6.5–8.1)

## 2020-10-14 LAB — URINALYSIS, ROUTINE W REFLEX MICROSCOPIC
Glucose, UA: NEGATIVE mg/dL
Hgb urine dipstick: NEGATIVE
Ketones, ur: NEGATIVE mg/dL
Leukocytes,Ua: NEGATIVE
Nitrite: NEGATIVE
Protein, ur: 30 mg/dL — AB
Specific Gravity, Urine: 1.024 (ref 1.005–1.030)
pH: 5 (ref 5.0–8.0)

## 2020-10-14 LAB — CBG MONITORING, ED: Glucose-Capillary: 139 mg/dL — ABNORMAL HIGH (ref 70–99)

## 2020-10-14 LAB — LIPID PANEL
Cholesterol: 117 mg/dL (ref 0–200)
HDL: 44 mg/dL (ref >40)
LDL Cholesterol: 57 mg/dL (ref 0–99)
Total CHOL/HDL Ratio: 2.7 RATIO
Triglycerides: 82 mg/dL (ref <150)
VLDL: 16 mg/dL (ref 0–40)

## 2020-10-14 LAB — LACTIC ACID, PLASMA
Lactic Acid, Venous: 2.5 mmol/L (ref 0.5–1.9)
Lactic Acid, Venous: 1.8 mmol/L (ref 0.5–1.9)

## 2020-10-14 LAB — PREALBUMIN: Prealbumin: 10.8 mg/dL — ABNORMAL LOW (ref 18–38)

## 2020-10-14 LAB — RESP PANEL BY RT-PCR (FLU A&B, COVID) ARPGX2
Influenza A by PCR: NEGATIVE
Influenza B by PCR: NEGATIVE
SARS Coronavirus 2 by RT PCR: POSITIVE — AB

## 2020-10-14 LAB — HEMOGLOBIN A1C
Hgb A1c MFr Bld: 5.7 % — ABNORMAL HIGH (ref 4.8–5.6)
Mean Plasma Glucose: 116.89 mg/dL

## 2020-10-14 LAB — C-REACTIVE PROTEIN: CRP: 32 mg/dL — ABNORMAL HIGH (ref <1.0)

## 2020-10-14 LAB — PROCALCITONIN: Procalcitonin: 2.87 ng/mL

## 2020-10-14 LAB — PROTIME-INR
INR: 1.4 — ABNORMAL HIGH (ref 0.8–1.2)
Prothrombin Time: 16.5 seconds — ABNORMAL HIGH (ref 11.4–15.2)

## 2020-10-14 LAB — FERRITIN: Ferritin: 890 ng/mL — ABNORMAL HIGH (ref 24–336)

## 2020-10-14 LAB — HEPATITIS B SURFACE ANTIGEN: Hepatitis B Surface Ag: NONREACTIVE

## 2020-10-14 LAB — HIV ANTIBODY (ROUTINE TESTING W REFLEX): HIV Screen 4th Generation wRfx: NONREACTIVE

## 2020-10-14 LAB — LACTATE DEHYDROGENASE: LDH: 724 U/L — ABNORMAL HIGH (ref 98–192)

## 2020-10-14 LAB — MAGNESIUM: Magnesium: 1.2 mg/dL — ABNORMAL LOW (ref 1.7–2.4)

## 2020-10-14 LAB — BRAIN NATRIURETIC PEPTIDE: B Natriuretic Peptide: 137.7 pg/mL — ABNORMAL HIGH (ref 0.0–100.0)

## 2020-10-14 LAB — TYPE AND SCREEN
ABO/RH(D): O POS
Antibody Screen: NEGATIVE

## 2020-10-14 LAB — APTT: aPTT: 40 seconds — ABNORMAL HIGH (ref 24–36)

## 2020-10-14 LAB — D-DIMER, QUANTITATIVE: D-Dimer, Quant: 5.74 ug/mL-FEU — ABNORMAL HIGH (ref 0.00–0.50)

## 2020-10-14 LAB — ECHOCARDIOGRAM COMPLETE

## 2020-10-14 LAB — FIBRINOGEN: Fibrinogen: 670 mg/dL — ABNORMAL HIGH (ref 210–475)

## 2020-10-14 MED ORDER — CALCIUM GLUCONATE-NACL 1-0.675 GM/50ML-% IV SOLN
1.0000 g | Freq: Once | INTRAVENOUS | Status: AC
  Administered 2020-10-14: 1000 mg via INTRAVENOUS
  Filled 2020-10-14: qty 50

## 2020-10-14 MED ORDER — SODIUM CHLORIDE 0.9 % IV SOLN: 100.0000 mg | Freq: Every day | INTRAVENOUS | Status: DC

## 2020-10-14 MED ORDER — LACTATED RINGERS IV BOLUS
1000.0000 mL | Freq: Once | INTRAVENOUS | Status: AC
  Administered 2020-10-14: 1000 mL via INTRAVENOUS

## 2020-10-14 MED ORDER — MAGNESIUM SULFATE 2 GM/50ML IV SOLN
2.0000 g | Freq: Once | INTRAVENOUS | Status: AC
  Administered 2020-10-14: 2 g via INTRAVENOUS
  Filled 2020-10-14: qty 50

## 2020-10-14 MED ORDER — GUAIFENESIN-DM 100-10 MG/5ML PO SYRP: 10.0000 mL | ORAL_SOLUTION | ORAL | Status: DC | PRN

## 2020-10-14 MED ORDER — LACTATED RINGERS IV BOLUS
500.0000 mL | Freq: Once | INTRAVENOUS | Status: AC
  Administered 2020-10-14: 500 mL via INTRAVENOUS

## 2020-10-14 MED ORDER — ASCORBIC ACID 500 MG PO TABS
500.0000 mg | ORAL_TABLET | Freq: Every day | ORAL | Status: DC
  Administered 2020-10-14 – 2020-10-18 (×4): 500 mg via ORAL
  Filled 2020-10-14 (×4): qty 1

## 2020-10-14 MED ORDER — ZINC SULFATE 220 (50 ZN) MG PO CAPS
220.0000 mg | ORAL_CAPSULE | Freq: Every day | ORAL | Status: DC
  Administered 2020-10-14 – 2020-10-18 (×4): 220 mg via ORAL
  Filled 2020-10-14 (×4): qty 1

## 2020-10-14 MED ORDER — ALBUTEROL SULFATE HFA 108 (90 BASE) MCG/ACT IN AERS
2.0000 | INHALATION_SPRAY | Freq: Four times a day (QID) | RESPIRATORY_TRACT | Status: DC
  Administered 2020-10-14 – 2020-10-18 (×16): 2 via RESPIRATORY_TRACT
  Filled 2020-10-14 (×3): qty 6.7

## 2020-10-14 MED ORDER — TRIAMCINOLONE ACETONIDE 0.1 % EX CREA
1.0000 "application " | TOPICAL_CREAM | Freq: Two times a day (BID) | CUTANEOUS | Status: DC | PRN
  Filled 2020-10-14: qty 15

## 2020-10-14 MED ORDER — ACETAMINOPHEN 325 MG PO TABS
650.0000 mg | ORAL_TABLET | Freq: Four times a day (QID) | ORAL | Status: DC | PRN
  Administered 2020-10-15 – 2020-10-18 (×2): 650 mg via ORAL
  Filled 2020-10-14 (×2): qty 2

## 2020-10-14 MED ORDER — APIXABAN 5 MG PO TABS
5.0000 mg | ORAL_TABLET | Freq: Two times a day (BID) | ORAL | Status: DC
  Administered 2020-10-14 – 2020-10-15 (×3): 5 mg via ORAL
  Filled 2020-10-14 (×3): qty 1

## 2020-10-14 MED ORDER — POTASSIUM CHLORIDE CRYS ER 20 MEQ PO TBCR
40.0000 meq | EXTENDED_RELEASE_TABLET | ORAL | Status: AC
  Administered 2020-10-14: 40 meq via ORAL
  Filled 2020-10-14: qty 2

## 2020-10-14 MED ORDER — SODIUM CHLORIDE 0.9 % IV BOLUS: 1000.0000 mL | Freq: Once | INTRAVENOUS | Status: DC

## 2020-10-14 MED ORDER — HYDROCOD POLST-CPM POLST ER 10-8 MG/5ML PO SUER: 5.0000 mL | Freq: Two times a day (BID) | ORAL | Status: DC | PRN

## 2020-10-14 MED ORDER — METHYLPREDNISOLONE SODIUM SUCC 125 MG IJ SOLR
0.5000 mg/kg | Freq: Two times a day (BID) | INTRAMUSCULAR | Status: DC
Start: 2020-10-14 — End: 2020-10-14
  Administered 2020-10-14: 47.5 mg via INTRAVENOUS
  Filled 2020-10-14: qty 2

## 2020-10-14 MED ORDER — PEDIALYTE PO SOLN
1000.0000 mL | ORAL | Status: DC
  Administered 2020-10-14 (×2): 1000 mL via ORAL
  Administered 2020-10-14: 236 mL via ORAL
  Administered 2020-10-15 – 2020-10-16 (×5): 1000 mL via ORAL
  Filled 2020-10-14 (×3): qty 1000

## 2020-10-14 MED ORDER — CALCIUM GLUCONATE-NACL 2-0.675 GM/100ML-% IV SOLN
2.0000 g | Freq: Once | INTRAVENOUS | Status: AC
  Administered 2020-10-14: 2000 mg via INTRAVENOUS
  Filled 2020-10-14: qty 100

## 2020-10-14 MED ORDER — SODIUM CHLORIDE 0.9% FLUSH
3.0000 mL | Freq: Once | INTRAVENOUS | Status: AC
  Administered 2020-10-14: 3 mL via INTRAVENOUS

## 2020-10-14 MED ORDER — PREDNISONE 20 MG PO TABS: 50.0000 mg | ORAL_TABLET | Freq: Every day | ORAL | Status: DC

## 2020-10-14 MED ORDER — SODIUM CHLORIDE 0.9% FLUSH
3.0000 mL | Freq: Two times a day (BID) | INTRAVENOUS | Status: DC
  Administered 2020-10-15 – 2020-10-24 (×15): 3 mL via INTRAVENOUS
  Administered 2020-10-25: 10 mL via INTRAVENOUS
  Administered 2020-10-25 – 2020-11-10 (×31): 3 mL via INTRAVENOUS

## 2020-10-14 MED ORDER — SODIUM CHLORIDE 0.9 % IV SOLN
200.0000 mg | Freq: Once | INTRAVENOUS | Status: AC
  Administered 2020-10-14: 200 mg via INTRAVENOUS
  Filled 2020-10-14: qty 40

## 2020-10-14 MED ORDER — SODIUM CHLORIDE 0.9 % IV SOLN
2.0000 g | Freq: Once | INTRAVENOUS | Status: AC
  Administered 2020-10-14: 2 g via INTRAVENOUS
  Filled 2020-10-14: qty 20

## 2020-10-14 MED ORDER — SODIUM CHLORIDE 0.9 % IV SOLN: INTRAVENOUS | Status: DC

## 2020-10-14 MED ORDER — SODIUM CHLORIDE 0.9 % IV SOLN
2.0000 g | INTRAVENOUS | Status: DC
  Administered 2020-10-14 – 2020-10-15 (×2): 2 g via INTRAVENOUS
  Filled 2020-10-14 (×2): qty 20

## 2020-10-14 NOTE — ED Triage Notes (Signed)
Pt bib ems with stroke like symptoms. Per ems pt had slurred speech, expressive aphasia, facial droop. LKW at 2300. Patient NIH 0 on assessment.

## 2020-10-14 NOTE — Code Documentation (Signed)
Responded to Code Stroke called at 0057 for R facial droop and AMS, [id number]. Pt on eliquis for Afib. Pt arrived at 0116 with symptoms resolved, NIH-0, CBG-139, CT head-negative. TIA alert.

## 2020-10-14 NOTE — ED Notes (Signed)
Pt had a large brown soft stool pt  And bed cleaned

## 2020-10-14 NOTE — CV Procedure (Signed)
Echo attempted but nursing in room. Will try again later.

## 2020-10-14 NOTE — ED Notes (Signed)
Pt is exertionally sob  sats staying around 91-92  Nasal 02 at 2 for respiratory support

## 2020-10-14 NOTE — ED Notes (Signed)
Pt transported to MRI 

## 2020-10-14 NOTE — Progress Notes (Signed)
Have tried multiple times to reach RN for patient to arrange for the MRI.  Unable to do until RN is spoken to.

## 2020-10-14 NOTE — Progress Notes (Signed)
Patient not requiring oxygen at this time, but patient had received 1 dose of steroids and remdesivir. Per PCCM recommendations discontinued the remdesivir and steroids. Repeat BMP revealed sodium 129, potassium 3.4, BUN 56, creatinine 5.08, calcium 4.5, magnesium 1.2, and prealbumin 10.8. He had been ordered 2 g of magnesium sulfate before magnesium level resulted. Based off patient's prealbumin he has moderate protein calorie nutrition.  Will follow-up with the GI panel.  Give patient 40 mEq of potassium chloride x1 dose now and will give an additional 2 g of magnesium sulfate.  Continue monitoring his electrolytes as needed.

## 2020-10-14 NOTE — Consult Note (Signed)
NEUROLOGY CONSULTATION NOTE   Date of service: October 14, 2020 Patient Name: Raymond Hurley. MRN:  [id number] DOB:  09/02/1931 Reason for consult: "Stroke code" _ _ _   _ __   _ __ _ _  __ __   _ __   __ _  History of Present Illness  Raymond Nawrot. is a 87 y.o. male with PMH significant for history of Melanoma ([place]) and Vitamin B 12 deficiency, Afibb on Eliquis qand compliant who presents with an episode of R facial droop, slurred speech and not following commands.  LKW per EMS was 2300 on 10/13/20. Wife called EMS after she found him. In the ED, he is improving and had no focal deficit. Of note, vitals with blood pressure of 83/50.  Per wife Raymond Hurley, Raymond Hurley has Covid. He has diarrhea, has been spending a lot of time in the bathroom, unable to tolerate PO intake. He went to take a shower, he got tired and was breathing heavily. It was uncomfortable for him to lay down. He was sitting in the chair and wife was checking on him.  Wife checked him on it at [place] on 10/15/19 and he was spaced out, was rambling, and he seemed to have difficulty pulling the words out. Was not coherent and did not make sense. Wife noted that he had a R facial droop.  He has not been eating and drinking a lot over the last couple of days.  NIHSS on presentation to the ED was 0.  NIHSS: 0 MRS: 0 TPA: not offered due to resolution of symptoms. Thrombectomy: not offered due to resolution of symptoms.   ROS   Constitutional Denies weight loss, fever and chills.  HEENT Denies changes in vision and hearing.  Respiratory Denies SOB and cough.  CV Denies palpitations and CP  GI Denies abdominal pain, nausea, vomiting and diarrhea.  GU Denies dysuria and urinary frequency.  MSK Denies myalgia and joint pain.  Skin Denies rash and pruritus.  Neurological Denies headache and syncope.  Psychiatric Denies recent changes in mood. Denies anxiety and depression.   Past History   Past Medical History:   Diagnosis Date  . Melanoma ([place])   . Vitamin B 12 deficiency    Past Surgical History:  Procedure Laterality Date  . CARDIOVERSION N/A 06/02/2019   Procedure: CARDIOVERSION;  Surgeon: Jerline Pain, MD;  Location: [facility] ENDOSCOPY;  Service: Cardiovascular;  Laterality: N/A;  . RIGHT/LEFT HEART CATH AND CORONARY ANGIOGRAPHY N/A 04/27/2019   Procedure: RIGHT/LEFT HEART CATH AND CORONARY ANGIOGRAPHY;  Surgeon: Jolaine Artist, MD;  Location: [place] CV LAB;  Service: Cardiovascular;  Laterality: N/A;  . TEE WITHOUT CARDIOVERSION N/A 06/02/2019   Procedure: TRANSESOPHAGEAL ECHOCARDIOGRAM (TEE);  Surgeon: Jerline Pain, MD;  Location: [facility] ENDOSCOPY;  Service: Cardiovascular;  Laterality: N/A;   No family history on file. Social History   Socioeconomic History  . Marital status: Married    Spouse name: Not on file  . Number of children: Not on file  . Years of education: Not on file  . Highest education level: Not on file  Occupational History  . Not on file  Tobacco Use  . Smoking status: Never Smoker  . Smokeless tobacco: Never Used  Vaping Use  . Vaping Use: Never used  Substance and Sexual Activity  . Alcohol use: Yes    Alcohol/week: 7.0 standard drinks    Types: 7 Glasses of wine per week  . Drug use: Never  .  Sexual activity: Not on file  Other Topics Concern  . Not on file  Social History Narrative  . Not on file   Social Determinants of Health   Financial Resource Strain: Not on file  Food Insecurity: Not on file  Transportation Needs: Not on file  Physical Activity: Not on file  Stress: Not on file  Social Connections: Not on file   Allergies  Allergen Reactions  . Povidone-Iodine Swelling  . Tape Swelling  . Bee Venom Rash    Medications  (Not in a hospital admission)    Vitals   Vitals:   10/14/20 0143  BP: (!) 83/50  Pulse: (!) 104  Resp: 16  Temp: 98.4 F (36.9 C)  TempSrc: Rectal  SpO2: 95%     There is no height or weight on file  to calculate BMI.  Physical Exam   General: Laying comfortably in bed; in no acute distress. HENT: Normal oropharynx and mucosa. Normal external appearance of ears and nose. Neck: Supple, no pain or tenderness CV: No JVD. No peripheral edema. Pulmonary: Symmetric Chest rise. Normal respiratory effort. Abdomen: Soft to touch, non-tender. Ext: No cyanosis, edema, or deformity Skin: No rash. Normal palpation of skin.  Musculoskeletal: Normal digits and nails by inspection. No clubbing.  Neurologic Examination  Mental status/Cognition: Alert, oriented to self, place, month and year, good attention. Speech/language: Fluent, comprehension intact, object naming intact, repetition intact. Cranial nerves:   CN II Pupils equal and reactive to light, no VF deficits   CN III,IV,VI EOM intact, no gaze preference or deviation, no nystagmus   CN V normal sensation in V1, V2, and V3 segments bilaterally   CN VII no asymmetry, no nasolabial fold flattening   CN VIII normal hearing to speech   CN IX & X normal palatal elevation, no uvular deviation   CN XI 5/5 head turn and 5/5 shoulder shrug bilaterally   CN XII midline tongue protrusion   Motor:  Muscle bulk: normal, tone normal, pronator drift none tremor none Mvmt Root Nerve  Muscle Right Left Comments  SA C5/6 Ax Deltoid 5 5   EF C5/6 Mc Biceps 5 5   EE C6/7/8 Rad Triceps 5 5   WF C6/7 Med FCR 5 5   WE C7/8 PIN ECU 5 5   F Ab C8/T1 U ADM/FDI 5 5   HF L1/2/3 Fem Illopsoas 4+ 4+   KE L2/3/4 Fem Quad     DF L4/5 D Peron Tib Ant 5 5   PF S1/2 Tibial Grc/Sol 5 5    Reflexes:  Right Left Comments  Pectoralis      Biceps (C5/6)     Brachioradialis (C5/6)      Triceps (C6/7)      Patellar (L3/4)      Achilles (S1)      Hoffman      Plantar     Jaw jerk    Sensation:  Light touch    Pin prick    Temperature    Vibration   Proprioception    Coordination/Complex Motor:  - Finger to Nose intact BL - Heel to shin intact BL -  Rapid alternating movement are normal - Gait: deferred.  Labs   CBC:  Recent Labs  Lab 10/14/20 0129  HGB 13.6  HCT 82.9    Basic Metabolic Panel:  Lab Results  Component Value Date   NA 130 (L) 10/14/2020   K 3.8 10/14/2020   CO2 28 03/03/2020   GLUCOSE  138 (H) 10/14/2020   BUN 59 (H) 10/14/2020   CREATININE 5.60 (H) 10/14/2020   CALCIUM 8.4 (L) 03/03/2020   GFRNONAA 55 (L) 03/03/2020   GFRAA 64 03/03/2020   Lipid Panel: No results found for: LDLCALC HgbA1c: No results found for: HGBA1C Urine Drug Screen: No results found for: LABOPIA, COCAINSCRNUR, LABBENZ, AMPHETMU, THCU, LABBARB  Alcohol Level No results found for: [place]  CT Head without contrast: CTH was negative for a large hypodensity concerning for a large territory infarct or hyperdensity concerning for an ICH.  MRI Brain without contrast: pending  Impression   Raymond Hurley. is a 87 y.o. male with PMH significant for Melanoma ([place]) and Vitamin B 12 deficiency, Afibb on Eliquis qand compliant who presents with an episode of R facial droop, aphasia that self resolved and NIHSS of 0 in the ED.  Symptoms are concerning for a TIA vs symptomatic carotid stenosis. Wife reports that he was just diagnosed with Covid and has diarrhea x 2 days and poor Po intake.  Recommendations   - Frequent Neuro checks per stroke unit protocol - Recommend brain imaging with MRI Brain without contrast - Recommend Vascular imaging with MRA Angio Head without contrast and US Carotid doppler - Recommend obtaining TTE - Recommend obtaining Lipid panel with LDL - Please start statin if LDL > 70 - Recommend HbA1c - continue Eliquis 41mf BID - Recommend DVT ppx - SBP goal - permissive hypertension first 24 h < 220/110. Hold home meds.  - Recommend Telemetry monitoring for arrythmia - Recommend bedside swallow screen prior to PO intake. - Stroke education booklet - Recommend PT/OT/SLP  consult  ______________________________________________________________________   Thank you for the opportunity to take part in the care of this patient. If you have any further questions, please contact the neurology consultation attending.  Signed,  Twin Pager Number [phone] _ _ _   _ __   _ __ _ _  __ __   _ __   __ _

## 2020-10-14 NOTE — ED Notes (Signed)
Pt returnedf from  [place] oriemnted skin warm and dry

## 2020-10-14 NOTE — ED Notes (Signed)
The pt remains sl sob on 02 still at 2  Smaller stool cleaned bed cleaned  Replaced diaper     Fresh ice water placed at the bedside

## 2020-10-14 NOTE — Consult Note (Signed)
NAME:  Raymond Ricketson., MRN:  [id number], DOB:  [birth year], LOS: 0 ADMISSION DATE:  10/14/2020, CONSULTATION DATE:  10/14/2020 REFERRING MD:  [place] - [facility], CHIEF COMPLAINT:  Hypotension.    HPI/course in hospital   87 year old man who is hypotensive in the context of acute diarrhea.  Took a exudative on 12/28 and began having profuse watery diarrhea thereafter with several accidents a day.  No blood.  Diffuse abdominal cramping relieved by defecation, mainly in the lower abdomen.  Has noticed increased abdominal distention.  Has eaten largely at home.  No other sick contacts.  Denies respiratory symptoms.  Tested positive for COVID-19.  Vaccinated and boosted.  Wife is negative.  Brought in today because of confusion.  Ruled out for stroke.  Neurological symptoms improved with fluid administration and correction of hypotension  Past Medical History   Past Medical History:  Diagnosis Date  . Atrial fibrillation ([place])   . Melanoma ([place])   . Vitamin B 12 deficiency      Past Surgical History:  Procedure Laterality Date  . CARDIOVERSION N/A 06/02/2019   Procedure: CARDIOVERSION;  Surgeon: Jerline Pain, MD;  Location: [facility] ENDOSCOPY;  Service: Cardiovascular;  Laterality: N/A;  . RIGHT/LEFT HEART CATH AND CORONARY ANGIOGRAPHY N/A 04/27/2019   Procedure: RIGHT/LEFT HEART CATH AND CORONARY ANGIOGRAPHY;  Surgeon: Jolaine Artist, MD;  Location: [place] CV LAB;  Service: Cardiovascular;  Laterality: N/A;  . TEE WITHOUT CARDIOVERSION N/A 06/02/2019   Procedure: TRANSESOPHAGEAL ECHOCARDIOGRAM (TEE);  Surgeon: Jerline Pain, MD;  Location: [facility] ENDOSCOPY;  Service: Cardiovascular;  Laterality: N/A;     Review of Systems:   Review of Systems  Constitutional: Negative for chills and fever.  HENT: Negative.   Eyes: Positive for blurred vision.  Respiratory: Negative for cough, sputum production and shortness of breath.   Cardiovascular: Positive for palpitations.  Gastrointestinal:  Positive for abdominal pain and diarrhea. Negative for blood in stool, nausea and vomiting.  Genitourinary: Negative.   Musculoskeletal: Negative.   Skin: Negative.   Neurological: Negative.   Psychiatric/Behavioral: Negative.     Social History   reports that he has never smoked. He has never used smokeless tobacco. He reports current alcohol use of about 7.0 standard drinks of alcohol per week. He reports that he does not use drugs.   Family History   His family history is not on file.   Allergies Allergies  Allergen Reactions  . Povidone-Iodine Swelling  . Tape Swelling  . Bee Venom Rash  . Covid-19 Mrna Vacc (Moderna) Hives and Rash    Rash came after booster shot. Pt was fine with 1st and 2nd     Home Medications  Prior to Admission medications   Medication Sig Start Date End Date Taking? Authorizing Provider  acetaminophen (TYLENOL) 500 MG tablet Take 1,000 mg by mouth every 6 (six) hours as needed for mild pain or headache.   Yes [provider]  ELIQUIS 5 MG TABS tablet TAKE ONE TABLET TWICE DAILY Patient taking differently: Take 5 mg by mouth 2 (two) times daily. 08/17/20  Yes Lelon Perla, MD  metoprolol succinate (TOPROL-XL) 25 MG 24 hr tablet Take 1 tablet (25 mg total) by mouth every evening. 05/22/20  Yes Lelon Perla, MD  sacubitril-valsartan (ENTRESTO) 24-26 MG Take 1 tablet by mouth 2 (two) times daily. 12/07/19  Yes Lelon Perla, MD  triamcinolone cream (KENALOG) 0.1 % Apply 1 application topically 2 (two) times daily as  needed (itching, rash). 04/25/20  Yes [provider]  montelukast (SINGULAIR) 10 MG tablet Take 1 tablet by mouth once daily as directed Patient not taking: No sig reported 06/13/20   Kozlow, Donnamarie Poag, MD     Interim history/subjective:  Given IV fluids and electrolyte replacement parenterally in the ED.  Initial improvement with fluids but blood pressure has softened since.  Objective   Blood pressure (!) 112/53,  pulse (!) 106, temperature 98.4 F (36.9 C), temperature source Rectal, resp. rate (!) 31, height 5\' 8"  (1.727 m), weight 95.1 kg, SpO2 91 %.        Intake/Output Summary (Last 24 hours) at 10/14/2020 1039 Last data filed at 10/14/2020 0700 Gross per 24 hour  Intake 1000 ml  Output 50 ml  Net 950 ml   Filed Weights   10/14/20 0153  Weight: 95.1 kg    Examination: Physical Exam Constitutional:      General: He is not in acute distress.    Appearance: Normal appearance. He is not ill-appearing.  HENT:     Nose: Nose normal.     Mouth/Throat:     Mouth: Mucous membranes are dry.  Eyes:     General: No scleral icterus.    Conjunctiva/sclera: Conjunctivae normal.  Cardiovascular:     Rate and Rhythm: Tachycardia present. Rhythm irregular.     Heart sounds: Normal heart sounds.  Pulmonary:     Breath sounds: Normal breath sounds.  Abdominal:     General: There is distension.     Tenderness: There is abdominal tenderness.  Genitourinary:    Penis: Normal.      Comments: Clear urine in Foley bag Skin:    General: Skin is dry.  Neurological:     General: No focal deficit present.     Mental Status: He is alert and oriented to person, place, and time.      Ancillary tests (personally reviewed)  CBC: Recent Labs  Lab 10/14/20 0123 10/14/20 0129  WBC 30.6*  --   NEUTROABS 19.2*  --   HGB 12.5* 13.6  HCT 36.9* 40.0  MCV 107.9*  --   PLT 162  --     Basic Metabolic Panel: Recent Labs  Lab 10/14/20 0123 10/14/20 0129 10/14/20 0909  NA 130* 130* 129*  K 3.8 3.8 3.4*  CL 96* 96* 97*  CO2 18*  --  19*  GLUCOSE 145* 138* 103*  BUN 57* 59* 56*  CREATININE 5.89* 5.60* 5.08*  CALCIUM 4.5*  --  4.5*  MG  --   --  1.2*   GFR: Estimated Creatinine Clearance: 11.5 mL/min (A) (by C-G formula based on SCr of 5.08 mg/dL (H)). Recent Labs  Lab 10/14/20 0123 10/14/20 0144 10/14/20 0412  WBC 30.6*  --   --   LATICACIDVEN  --  2.5* 1.8    Liver Function  Tests: Recent Labs  Lab 10/14/20 0123  AST 86*  ALT 68*  ALKPHOS 56  BILITOT 0.9  PROT 5.8*  ALBUMIN 2.8*   No results for input(s): LIPASE, AMYLASE in the last 168 hours. No results for input(s): AMMONIA in the last 168 hours.  ABG    Component Value Date/Time   PHART 7.345 (L) 04/27/2019 1526   PCO2ART 40.8 04/27/2019 1526   PO2ART 112.0 (H) 04/27/2019 1526   HCO3 18.3 (L) 04/27/2019 1530   TCO2 20 (L) 10/14/2020 0129   ACIDBASEDEF 8.0 (H) 04/27/2019 1530   O2SAT 73.0 04/27/2019 1530  Coagulation Profile: Recent Labs  Lab 10/14/20 0123  INR 1.4*    Cardiac Enzymes: No results for input(s): CKTOTAL, CKMB, CKMBINDEX, TROPONINI in the last 168 hours.  HbA1C: No results found for: HGBA1C  CBG: Recent Labs  Lab 10/14/20 0119  GLUCAP 139*    Assessment & Plan:   Hypotension secondary to acute diarrheal illness.  Transient and has resolved with further fluid administration.  Lactate has cleared. Asymptomatic COVID-19.  While diarrhea can be a manifestation of COVID-19 the marked leukocytosis is more consistent with an infectious cause of diarrhea other than Covid. Hypovolemic hyponatremia Hypocalcemia Hypokalemia Paroxysmal atrial fibrillation with reasonable rate control  Plan:  -Hemodynamically stable with no signs of shock and normal clinical perfusion.  Can be admitted to PCU.  No need for ICU admission at this time. -Enteral fluid replacement and electrolyte replacement.  Repeat electrolyte panel every 6-12 hours and replete accordingly. -Have ordered Pedialyte as oral rehydration will correct diarrhea more rapidly than IV fluids and will help with electrolyte repletion. -KUB and stool pathogen panel. -Low threshold to stop antibiotics. -Would not treat for Covid at this time as not requiring oxygen, patient is also been boosted and is at low risk for severe Covid disease. -Fluid repletion and electrolyte correction should also help stabilize atrial  fibrillation but at this time rate control remains adequate.  Please reconsult PCCM if further assistance is required.   Kipp Brood, MD [facility] ICU Physician [place]  Pager: [phone] Or Epic Secure Chat After hours: [phone].  10/14/2020, 10:39 AM

## 2020-10-14 NOTE — ED Provider Notes (Addendum)
[place] [facility] EMERGENCY DEPARTMENT Provider Note   CSN: [id number] Arrival date & time: 10/14/20  0116  An emergency department physician performed an initial assessment on this suspected stroke patient at 0117.  History Chief Complaint  Patient presents with  . Code Stroke    Raymond Hurley. is a 87 y.o. male.  Patient presents to the emergency department from home as a code stroke.  Patient's wife reports that he suddenly developed confusion and a right facial droop.  EMS confirmed these findings upon their arrival.  Patient does have a history of atrial fibrillation and is on Eliquis chronically.  At arrival to the emergency department, patient seems to have improved.  He is awake and alert, oriented.  Is following commands without difficulty.        Past Medical History:  Diagnosis Date  . Melanoma ([place])   . Vitamin B 12 deficiency     Patient Active Problem List   Diagnosis Date Noted  . PAF (paroxysmal atrial fibrillation) ([place]) 07/05/2020  . Anticoagulated 07/05/2020  . NICM (nonischemic cardiomyopathy) ([place]) 07/05/2020  . History of melanoma 07/05/2020    Past Surgical History:  Procedure Laterality Date  . CARDIOVERSION N/A 06/02/2019   Procedure: CARDIOVERSION;  Surgeon: Jerline Pain, MD;  Location: [facility] ENDOSCOPY;  Service: Cardiovascular;  Laterality: N/A;  . RIGHT/LEFT HEART CATH AND CORONARY ANGIOGRAPHY N/A 04/27/2019   Procedure: RIGHT/LEFT HEART CATH AND CORONARY ANGIOGRAPHY;  Surgeon: Jolaine Artist, MD;  Location: [place] CV LAB;  Service: Cardiovascular;  Laterality: N/A;  . TEE WITHOUT CARDIOVERSION N/A 06/02/2019   Procedure: TRANSESOPHAGEAL ECHOCARDIOGRAM (TEE);  Surgeon: Jerline Pain, MD;  Location: [facility] ENDOSCOPY;  Service: Cardiovascular;  Laterality: N/A;       No family history on file.  Social History   Tobacco Use  . Smoking status: Never Smoker  . Smokeless tobacco: Never Used  Vaping Use  . Vaping Use:  Never used  Substance Use Topics  . Alcohol use: Yes    Alcohol/week: 7.0 standard drinks    Types: 7 Glasses of wine per week  . Drug use: Never    Home Medications Prior to Admission medications   Medication Sig Start Date End Date Taking? Authorizing Provider  Cyanocobalamin (B-12 PO) Take by mouth.    [provider]  Docusate Calcium (STOOL SOFTENER PO) Take by mouth.    [provider]  ELIQUIS 5 MG TABS tablet TAKE ONE TABLET TWICE DAILY 08/17/20   Lelon Perla, MD  fluticasone [facility]) 50 MCG/ACT nasal spray Place into both nostrils daily.    [provider]  Hydrocortisone ([id number] EX) Apply 1 application topically 3 (three) times daily as needed (itching/sking irritation.).    [provider]  metoprolol succinate (TOPROL-XL) 25 MG 24 hr tablet Take 1 tablet (25 mg total) by mouth every evening. 05/22/20   Lelon Perla, MD  montelukast (SINGULAIR) 10 MG tablet Take 1 tablet by mouth once daily as directed 06/13/20   Kozlow, Donnamarie Poag, MD  sacubitril-valsartan (ENTRESTO) 24-26 MG Take 1 tablet by mouth 2 (two) times daily. 12/07/19   Lelon Perla, MD  tetrahydrozoline 0.05 % ophthalmic solution Place 1-2 drops into both eyes 3 (three) times daily as needed (dry/irritated eyes.).    [provider]  triamcinolone cream (KENALOG) 0.1 % Apply topically 2 (two) times daily. 04/25/20   [provider]    Allergies    Povidone-iodine, Tape, and Bee venom  Review  of Systems   Review of Systems  Respiratory: Negative for shortness of breath.   Cardiovascular: Negative for chest pain.  All other systems reviewed and are negative.   Physical Exam Updated Vital Signs BP (!) 90/47   Pulse 89   Temp 98.4 F (36.9 C) (Rectal)   Resp (!) 21   Ht 5\' 8"  (1.727 m)   Wt 95.1 kg   SpO2 100%   BMI 31.88 kg/m   Physical Exam Vitals and nursing note reviewed.  Constitutional:      General: He is not in acute  distress.    Appearance: Normal appearance. He is well-developed and well-nourished.  HENT:     Head: Normocephalic and atraumatic.     Right Ear: Hearing normal.     Left Ear: Hearing normal.     Nose: Nose normal.     Mouth/Throat:     Mouth: Oropharynx is clear and moist and mucous membranes are normal.  Eyes:     Extraocular Movements: EOM normal.     Conjunctiva/sclera: Conjunctivae normal.     Pupils: Pupils are equal, round, and reactive to light.  Cardiovascular:     Rate and Rhythm: Regular rhythm.     Heart sounds: S1 normal and S2 normal. No murmur heard. No friction rub. No gallop.   Pulmonary:     Effort: Pulmonary effort is normal. No respiratory distress.     Breath sounds: Normal breath sounds.  Chest:     Chest wall: No tenderness.  Abdominal:     General: Bowel sounds are normal.     Palpations: Abdomen is soft. There is no hepatosplenomegaly.     Tenderness: There is no abdominal tenderness. There is no guarding or rebound. Negative signs include Murphy's sign and McBurney's sign.     Hernia: No hernia is present.  Musculoskeletal:        General: Normal range of motion.     Cervical back: Normal range of motion and neck supple.  Skin:    General: Skin is warm, dry and intact.     Findings: No rash.     Nails: There is no cyanosis.  Neurological:     Mental Status: He is alert and oriented to person, place, and time.     GCS: GCS eye subscore is 4. GCS verbal subscore is 5. GCS motor subscore is 6.     Cranial Nerves: No cranial nerve deficit.     Sensory: No sensory deficit.     Coordination: Coordination normal.     Deep Tendon Reflexes: Strength normal.  Psychiatric:        Mood and Affect: Mood and affect normal.        Speech: Speech normal.        Behavior: Behavior normal.        Thought Content: Thought content normal.     ED Results / Procedures / Treatments   Labs (all labs ordered are listed, but only abnormal results are  displayed) Labs Reviewed  RESP PANEL BY RT-PCR (FLU A&B, COVID) ARPGX2 - Abnormal; Notable for the following components:      Result Value   SARS Coronavirus 2 by RT PCR POSITIVE (*)    All other components within normal limits  PROTIME-INR - Abnormal; Notable for the following components:   Prothrombin Time 16.5 (*)    INR 1.4 (*)    All other components within normal limits  APTT - Abnormal; Notable for the following components:  aPTT 40 (*)    All other components within normal limits  CBC - Abnormal; Notable for the following components:   WBC 30.6 (*)    RBC 3.42 (*)    Hemoglobin 12.5 (*)    HCT 36.9 (*)    MCV 107.9 (*)    MCH 36.5 (*)    All other components within normal limits  DIFFERENTIAL - Abnormal; Notable for the following components:   Neutro Abs 19.2 (*)    Monocytes Absolute 9.6 (*)    Abs Immature Granulocytes 1.15 (*)    All other components within normal limits  COMPREHENSIVE METABOLIC PANEL - Abnormal; Notable for the following components:   Sodium 130 (*)    Chloride 96 (*)    CO2 18 (*)    Glucose, Bld 145 (*)    BUN 57 (*)    Creatinine, Ser 5.89 (*)    Calcium 4.5 (*)    Total Protein 5.8 (*)    Albumin 2.8 (*)    AST 86 (*)    ALT 68 (*)    GFR, Estimated 9 (*)    Anion gap 16 (*)    All other components within normal limits  URINALYSIS, ROUTINE W REFLEX MICROSCOPIC - Abnormal; Notable for the following components:   Color, Urine AMBER (*)    APPearance CLOUDY (*)    Bilirubin Urine SMALL (*)    Protein, ur 30 (*)    Bacteria, UA RARE (*)    All other components within normal limits  LACTIC ACID, PLASMA - Abnormal; Notable for the following components:   Lactic Acid, Venous 2.5 (*)    All other components within normal limits  BRAIN NATRIURETIC PEPTIDE - Abnormal; Notable for the following components:   B Natriuretic Peptide 137.7 (*)    All other components within normal limits  I-STAT CHEM 8, ED - Abnormal; Notable for the following  components:   Sodium 130 (*)    Chloride 96 (*)    BUN 59 (*)    Creatinine, Ser 5.60 (*)    Glucose, Bld 138 (*)    Calcium, Ion 0.55 (*)    TCO2 20 (*)    All other components within normal limits  CBG MONITORING, ED - Abnormal; Notable for the following components:   Glucose-Capillary 139 (*)    All other components within normal limits  TROPONIN I (HIGH SENSITIVITY) - Abnormal; Notable for the following components:   Troponin I (High Sensitivity) 50 (*)    All other components within normal limits  TROPONIN I (HIGH SENSITIVITY) - Abnormal; Notable for the following components:   Troponin I (High Sensitivity) 61 (*)    All other components within normal limits  LACTIC ACID, PLASMA    EKG EKG Interpretation  Date/Time:  Saturday October 14 2020 01:40:11 EST Ventricular Rate:  97 PR Interval:    QRS Duration: 118 QT Interval:  406 QTC Calculation: 516 R Axis:   -54 Text Interpretation: Atrial fibrillation Paired ventricular premature complexes Left anterior fascicular block Low voltage, precordial leads Nonspecific T abnormalities, lateral leads Confirmed by Orpah Greek [phone]) on 10/14/2020 5:19:28 AM   Radiology CT HEAD CODE STROKE WO CONTRAST  Result Date: 10/14/2020 CLINICAL DATA:  Code stroke.  Suspected TIA EXAM: CT HEAD WITHOUT CONTRAST TECHNIQUE: Contiguous axial images were obtained from the base of the skull through the vertex without intravenous contrast. COMPARISON:  None. FINDINGS: Brain: There is no mass, hemorrhage or extra-axial collection. The size and configuration of  the ventricles and extra-axial CSF spaces are normal. The brain parenchyma is normal, without evidence of acute or chronic infarction. Vascular: No abnormal hyperdensity of the major intracranial arteries or dural venous sinuses. No intracranial atherosclerosis. Skull: The visualized skull base, calvarium and extracranial soft tissues are normal. Sinuses/Orbits: No fluid levels or advanced  mucosal thickening of the visualized paranasal sinuses. No mastoid or middle ear effusion. The orbits are normal. ASPECTS [facility] Stroke Program Early CT Score) - Ganglionic level infarction (caudate, lentiform nuclei, internal capsule, insula, M1-M3 cortex): 7 - Supraganglionic infarction (M4-M6 cortex): 3 Total score (0-10 with 10 being normal): 10 IMPRESSION: 1. Normal head CT. 2. ASPECTS is 10. 3. These results were communicated to Dr. Donnetta Simpers at 1:45 am on 10/14/2020 by telephone. Electronically Signed   By: Ulyses Jarred M.D.   On: 10/14/2020 01:46    Procedures Procedures (including critical care time)  Medications Ordered in ED Medications  sodium chloride flush (NS) 0.9 % injection 3 mL (3 mLs Intravenous Given 10/14/20 0236)  lactated ringers bolus 500 mL (0 mLs Intravenous Stopped 10/14/20 0337)  lactated ringers bolus 1,000 mL (0 mLs Intravenous Stopped 10/14/20 0544)    Followed by  lactated ringers bolus 1,000 mL (0 mLs Intravenous Stopped 10/14/20 0544)  lactated ringers bolus 1,000 mL (1,000 mLs Intravenous New Bag/Given 10/14/20 0538)    ED Course  I have reviewed the triage vital signs and the nursing notes.  Pertinent labs & imaging results that were available during my care of the patient were reviewed by me and considered in my medical decision making (see chart for details).    MDM Rules/Calculators/A&P                          Patient did not have any obvious focal neurologic deficits at arrival to the department.  No obvious signs of stroke on initial head CT.  Neurology recommending further work-up for TIA.  Patient reports being sick for a number of days.  He has had watery diarrhea and poor oral intake.  He reports that he has not had any urine output over the last day.  Patient found to be profoundly dehydrated with an acute kidney injury.  He is hypotensive, felt to be secondary to his dehydration.  He is being aggressively fluid resuscitated.  Patient  afebrile at arrival.  He does have a significant leukocytosis, but does not have any obvious source of infection.  Some of this is likely secondary to hemoconcentration from his dehydration.  Lactic acid was slightly elevated at arrival but this has cleared.  Patient is alert and mentating fine.  At this point I do not feel that he is septic.  No antibiotics indicated.  Illness consistent with Covid infection.  Discussed with Dr. Valeta Harms, on-call for critical care.  Agrees with current treatment plan, no antibiotics sepsis treatment at this time.  Continue aggressive fluid hydration.  Patient does not require ICU admission, can be admitted to stepdown unit by hospitalist service, reconsult if needed.  Addendum - CXR returns patchy left lung opacity. Hospitalist has ordered appropriate antibiotics.  CRITICAL CARE Performed by: Orpah Greek   Total critical care time: 40 minutes  Critical care time was exclusive of separately billable procedures and treating other patients.  Critical care was necessary to treat or prevent imminent or life-threatening deterioration.  Critical care was time spent personally by me on the following activities: development of treatment plan with patient  and/or surrogate as well as nursing, discussions with consultants, evaluation of patient's response to treatment, examination of patient, obtaining history from patient or surrogate, ordering and performing treatments and interventions, ordering and review of laboratory studies, ordering and review of radiographic studies, pulse oximetry and re-evaluation of patient's condition.   Final Clinical Impression(s) / ED Diagnoses Final diagnoses:  TIA (transient ischemic attack)  AKI (acute kidney injury) ([place])  Dehydration    Rx / DC Orders ED Discharge Orders    None       Thereasa Iannello, Gwenyth Allegra, MD 10/14/20 2633    Orpah Greek, MD 10/14/20 3545    Orpah Greek, MD 10/14/20  [phone]

## 2020-10-14 NOTE — ED Notes (Signed)
prdialyte solution givne

## 2020-10-14 NOTE — Progress Notes (Signed)
STROKE TEAM PROGRESS NOTE   HISTORY OF PRESENT ILLNESS (per record) Raymond Hurley. is a 87 y.o. male with PMH significant for history of Melanoma ([place]), Vitamin B 12 deficiency, and Afib on Eliquis qand compliant who presents with an episode of R facial droop, slurred speech and not following commands. LKW per EMS was 2300 on 10/13/20. Wife called EMS after she found him. In the ED, he is improving and had no focal deficit. Of note, vitals with blood pressure of 83/50. Per wife Raymond Hurley, Raymond Hurley has Covid. He has diarrhea, has been spending a lot of time in the bathroom, unable to tolerate PO intake. He went to take a shower, he got tired and was breathing heavily. It was uncomfortable for him to lay down. He was sitting in the chair and wife was checking on him. Wife checked him on it at [place] on 10/15/19 and he was spaced out, was rambling, and he seemed to have difficulty pulling the words out. Was not coherent and did not make sense. Wife noted that he had a R facial droop. He has not been eating and drinking a lot over the last couple of days. NIHSS on presentation to the ED was 0. NIHSS: 0 MRS: 0 TPA: not offered due to resolution of symptoms. Thrombectomy: not offered due to resolution of symptoms.   INTERVAL HISTORY No new events or complaints. Patient has significant cough.  OBJECTIVE Vitals:   10/14/20 1045 10/14/20 1100 10/14/20 1115 10/14/20 1145  BP: (!) 105/51 (!) 96/41 (!) 90/56 94/60  Pulse: 73 66 99 62  Resp: (!) 29 (!) 32 (!) 30 (!) 22  Temp:      TempSrc:      SpO2: 94% 93% 94% 93%  Weight:      Height:        CBC:  Recent Labs  Lab 10/14/20 0123 10/14/20 0129  WBC 30.6*  --   NEUTROABS 19.2*  --   HGB 12.5* 13.6  HCT 36.9* 40.0  MCV 107.9*  --   PLT 162  --     Basic Metabolic Panel:  Recent Labs  Lab 10/14/20 0123 10/14/20 0129 10/14/20 0909  NA 130* 130* 129*  K 3.8 3.8 3.4*  CL 96* 96* 97*  CO2 18*  --  19*  GLUCOSE 145* 138* 103*  BUN  57* 59* 56*  CREATININE 5.89* 5.60* 5.08*  CALCIUM 4.5*  --  4.5*  MG  --   --  1.2*    Lipid Panel:     Component Value Date/Time   CHOL 117 10/14/2020 0909   TRIG 82 10/14/2020 0909   HDL 44 10/14/2020 0909   CHOLHDL 2.7 10/14/2020 0909   VLDL 16 10/14/2020 0909   LDLCALC 57 10/14/2020 0909   HgbA1c:  Lab Results  Component Value Date   HGBA1C 5.7 (H) 10/14/2020   Urine Drug Screen: No results found for: LABOPIA, COCAINSCRNUR, LABBENZ, AMPHETMU, THCU, LABBARB  Alcohol Level No results found for: [place]  DG Chest Port 1 View 10/14/2020 IMPRESSION:  Patchy left lung base opacity, suggesting aspiration or pneumonia.   CT HEAD CODE STROKE WO CONTRAST 10/14/2020 IMPRESSION:  1. Normal head CT.  2. ASPECTS is 10.   MRI BRAIN WO CONTRAST/MRA HEAD WO CONTRAST MRI HEAD WITHOUT CONTRAST  MRA HEAD WITHOUT CONTRAST  TECHNIQUE: Multiplanar, multiecho pulse sequences of the brain and surrounding structures were obtained without intravenous contrast. Angiographic images of the head were obtained using MRA technique  without contrast.  COMPARISON:  CT head 10/14/2020  FINDINGS: MRI HEAD FINDINGS  Brain: Negative for acute infarct. Small area of T2 shine through in the left frontal white matter. No areas of restricted diffusion.  Mild white matter changes with scattered small periventricular deep white matter hyperintensities bilaterally. Brainstem normal. Negative for hemorrhage or mass. Negative for hydrocephalus. Cerebral volume normal.  Vascular: Normal arterial flow voids  Skull and upper cervical spine: No focal skeletal abnormality.  Sinuses/Orbits: Mild mucosal edema paranasal sinuses and right mastoid sinus. Normal orbit  Other: None  MRA HEAD FINDINGS  Both vertebral arteries are widely patent and normal. Basilar widely patent. AICA, PICA widely patent. Superior cerebellar and posterior cerebral arteries normal  bilaterally.  Internal carotid artery widely patent bilaterally. Anterior and middle cerebral arteries normal bilaterally.  Negative for cerebral aneurysm.  IMPRESSION: 1. Negative for acute infarct. Mild chronic microvascular ischemic change in the white matter. 2. Negative MRA head  Transthoracic Echocardiogram - pending  Bilateral Carotid Dopplers - pending  ECG - atrial fibrillation - ventricular response 97 BPM (See cardiology reading for complete details)  PHYSICAL EXAM Blood pressure 94/60, pulse 62, temperature 98.4 F (36.9 C), temperature source Rectal, resp. rate (!) 22, height 5\' 8"  (1.727 m), weight 95.1 kg, SpO2 93 %. Laying comfortably in bed; no acute distress but with significant cough. Neck: Supple, no pain or tenderness CV: No JVD. No peripheral edema. Pulmonary: Symmetric Chest rise. Normal respiratory effort. Abdomen: Soft to touch, non-tender. Ext: No cyanosis, edema, or deformity Skin: No rash. Normal palpation of skin.  Musculoskeletal: Normal digits and nails by inspection. No clubbing.  Mental status: Alert, oriented to self, place, year, good attention. Speech/language: Fluent, comprehension intact, object naming intact, repetition intact. Cranial nerves:   CN II Pupils equal and reactive to light, no VF deficits  CN III,IV,VI EOM intact, no gaze preference or deviation, no nystagmus  CN V normal sensation in V1, V2, and V3 segments bilaterally  CN VII no asymmetry, no nasolabial fold flattening  CN VIII normal hearing to speech  CN IX & X normal palatal elevation, no uvular deviation  CN XI 5/5 head turn and 5/5 shoulder shrug bilaterally  CN XII midline tongue protrusion Motor: Tone is normal. Bulk is normal. 5/5 strength was present in all four extremities. Sensory: Sensation is symmetric to light touch and temperature in the arms and legs. Deep Tendon Reflexes: 2+ and symmetric in the biceps and patellae. Plantars: Toes are downgoing  bilaterally. Cerebellar: FNF and HKS are intact bilaterally.  ASSESSMENT/PLAN Raymond Hurley. is a 87 y.o. male with history of Melanoma ([place]) ([place]), Vitamin B 12 deficiency, Afib on Eliquis, hx of VT, dilated cardiomyopathy, and recent poor PO intake who presents with an episode of AMS, R facial droop, slurred speech, word finding difficulties and BP 83/50 (fluids) in ED. He did not receive IV t-PA due to resolution of deficits.  TIA: - embolic - likely from atrial fibrillation despite Eliquis.  Code Stroke CT Head - Normal head CT. ASPECTS is 10.   CT head - not ordered  MRI head - No acute stroke  MRA head - No LVO or significant flow limiting stenoses.  CTA H&N - not ordered  CT Perfusion - not ordered  Carotid Doppler - pending  2D Echo - pending  [place] Corona Virus 2 - positive   LDL - 57  HgbA1c - 5.7  UDS - not ordered  VTE prophylaxis -  SCDs / Eliquis Diet  Diet Order            Diet Heart Room service appropriate? Yes; Fluid consistency: Thin  Diet effective now                 Eliquis 5 mg Bid prior to admission, now on Eliquis 5 mg Bid  Patient will be counseled to be compliant with his antithrombotic medications  Ongoing aggressive stroke risk factor management  Therapy recommendations:  pending  Disposition:  Pending  Hypertension  Home BP meds: Toprol XL 25 mg daily  Current BP meds: none   Hypotensive . Permissive hypertension (avoid extreme htn with CM hx) but gradually normalize in 5-7 days  . Long-term BP goal normotensive  Hyperlipidemia  Home Lipid lowering medication: none   LDL 57, goal < 70  Current lipid lowering medication: none - hold off statin with LDL < 70 and mildly elevated LFTs  Continue statin at discharge  Other Stroke Risk Factors  Advanced age  ETOH use, advised to drink no more than 1 alcoholic beverage per day.  Obesity, Body mass index is 31.88 kg/m., recommend weight  loss, diet and exercise as appropriate   Family hx stroke - not on file  Atrial fibrillation on Eliquis   CM / CHF hx  Other Active Problems, Findings and Recommendations  Code status - not documented  NPO  Paroxysmal atrial fibrillation (Elquis)  Dilated cardiomyopathy / CHF (EF 20% -> 45%) ([place])  Hx of VT (per Oncology)  Abnormal CXR - aspiration vs pneumonia  Na 130  Acute kidney failure - Creatinine - 5.60 ;  GFR - 9 (recent diarrhea and poor PO intake)  Leukocytosis - WBC's - 30.6 (afebrile)  Hypocalcemia  AST - 86  ;  ALT - [facility] day # 0  To contact Stroke Continuity provider, please refer to [email or web address] After hours, contact General Neurology

## 2020-10-14 NOTE — ED Notes (Signed)
Raymond Hurley (pt wife) called for an update please call [phone]

## 2020-10-14 NOTE — H&P (Addendum)
History and Physical    [name]. HUT:[id number] DOB: April 26, 1931 DOA: 10/14/2020  Referring MD/NP/PA: Shela Leff, MD PCP: Lajean Manes, MD  Patient coming from: Home via EMS  Chief Complaint: Altered  I have personally briefly reviewed patient's old medical records in [place]   HPI: Raymond Hurley. is a 87 y.o. male with medical history significant of atrial fibrillation on chronic anticoagulation, systolic congestive heart failure last EF 45%, melanoma, and vitamin B12 deficiency presents after being found to be acutely altered.  History is obtained from the patient who is more lucid at this time as well as his wife over the phone.  Approximately 4 days ago patient had developed subjective fever and reported to have a cough.  They were out of town at the time and he had went and got checked and was found to be positive for COVID-19.   They came back immediately to [facility]. Patient received both Moderna COVID-19 vaccines and booster. Reports that he got his booster about 4 months ago. During this time patient was complaining of severe abdominal pain and had been constipated he had taken MiraLAX once.  He soon thereafter had developed severe explosive diarrhea.  Patient wife notes that he was up and down throughout the course of the day with just watery diarrhea.  She had encouraged him to keep himself hydrated, but he had not been able to keep up.  Due to the massive bouts of diarrhea he was sleeping and chair close to the bathroom.  This morning when she went to check on him around 12:30 AM and noticed that he was talking gibberish.  She noted that he appeared spaced out and asked him if he knew who she was, but states that it took a while for him to say her name.  She immediately called EMS.  Upon their arrival wife notes that they appreciated some left-sided facial droop, but prior documentation reports right-sided facial droop.   ED Course: Presented to the  hospital as a code stroke and was evaluated by neurology.  CT scan of the brain showed no acute abnormalities.  Patient was noted to be afebrile, pulse 32-107, respirations 16-29, blood pressure 66/53-111/78, and O2 saturations maintained.  Labs significant for WBC 30.6, hemoglobin 12.5, sodium 130, chloride 96, CO2 18, BUN 57, creatinine 5.89, anion gap 16, albumin 2.8, AST 86, ALT 68, lactic acid 2.5, troponin 50-> 61, and BNP 137.7.  COVID-19 screening was noted to be positive.  Urinalysis did not appear to show signs of infection.  Chest x-ray showed concern for patchy left lung base opacity concerning for aspiration versus pneumonia.  Patient had been given a total of 4.5 L of lactated [place] and 1 g of calcium gluconate.  TRH called to admit.  Review of Systems  Constitutional: Positive for fever and malaise/fatigue.  HENT: Positive for congestion.   Eyes: Negative for photophobia.  Respiratory: Positive for cough, sputum production and shortness of breath.   Cardiovascular: Negative for leg swelling.  Gastrointestinal: Positive for abdominal pain, constipation, diarrhea and nausea.  Genitourinary: Negative for dysuria and hematuria.  Musculoskeletal: Negative for falls.  Skin: Negative for itching and rash.  Neurological: Positive for speech change and weakness. Negative for loss of consciousness.  Psychiatric/Behavioral: Negative for memory loss and substance abuse.    Past Medical History:  Diagnosis Date  . Melanoma ([place])   . Vitamin B 12 deficiency     Past Surgical History:  Procedure Laterality Date  .  CARDIOVERSION N/A 06/02/2019   Procedure: CARDIOVERSION;  Surgeon: Jerline Pain, MD;  Location: [facility] ENDOSCOPY;  Service: Cardiovascular;  Laterality: N/A;  . RIGHT/LEFT HEART CATH AND CORONARY ANGIOGRAPHY N/A 04/27/2019   Procedure: RIGHT/LEFT HEART CATH AND CORONARY ANGIOGRAPHY;  Surgeon: Jolaine Artist, MD;  Location: [facility] CV LAB;  Service: Cardiovascular;   Laterality: N/A;  . TEE WITHOUT CARDIOVERSION N/A 06/02/2019   Procedure: TRANSESOPHAGEAL ECHOCARDIOGRAM (TEE);  Surgeon: Jerline Pain, MD;  Location: [facility] ENDOSCOPY;  Service: Cardiovascular;  Laterality: N/A;     reports that he has never smoked. He has never used smokeless tobacco. He reports current alcohol use of about 7.0 standard drinks of alcohol per week. He reports that he does not use drugs.  Allergies  Allergen Reactions  . Povidone-Iodine Swelling  . Tape Swelling  . Bee Venom Rash    No family history on file.  Prior to Admission medications   Medication Sig Start Date End Date Taking? Authorizing Provider  Cyanocobalamin (B-12 PO) Take by mouth.    [provider]  Docusate Calcium (STOOL SOFTENER PO) Take by mouth.    [provider]  ELIQUIS 5 MG TABS tablet TAKE ONE TABLET TWICE DAILY 08/17/20   Lelon Perla, MD  fluticasone [facility]) 50 MCG/ACT nasal spray Place into both nostrils daily.    [provider]  Hydrocortisone (NIDPOEUMP-53 EX) Apply 1 application topically 3 (three) times daily as needed (itching/sking irritation.).    [provider]  metoprolol succinate (TOPROL-XL) 25 MG 24 hr tablet Take 1 tablet (25 mg total) by mouth every evening. 05/22/20   Lelon Perla, MD  montelukast (SINGULAIR) 10 MG tablet Take 1 tablet by mouth once daily as directed 06/13/20   Kozlow, Donnamarie Poag, MD  sacubitril-valsartan (ENTRESTO) 24-26 MG Take 1 tablet by mouth 2 (two) times daily. 12/07/19   Lelon Perla, MD  tetrahydrozoline 0.05 % ophthalmic solution Place 1-2 drops into both eyes 3 (three) times daily as needed (dry/irritated eyes.).    [provider]  triamcinolone cream (KENALOG) 0.1 % Apply topically 2 (two) times daily. 04/25/20   [provider]    Physical Exam:  Constitutional: Elderly male who appears acutely ill and complaining of nausea Vitals:   10/14/20 0645 10/14/20 0700 10/14/20 0715 10/14/20  0730  BP: (!) 95/55 (!) 90/52 111/78 (!) 90/44  Pulse: 79 90 (!) 107 (!) 103  Resp: (!) 26 (!) 22 (!) 26 (!) 28  Temp:      TempSrc:      SpO2: 92% 93% 92% 93%  Weight:      Height:       Eyes: PERRL, lids and conjunctivae normal ENMT: Mucous membranes are dry. Posterior pharynx clear of any exudate or lesions.  Neck: normal, supple, no masses, no thyromegaly Respiratory: clear to auscultation bilaterally, no wheezing, no crackles. Normal respiratory effort. No accessory muscle use.  Cardiovascular: Irregular no murmurs / rubs / gallops. No extremity edema. 2+ pedal pulses. No carotid bruits.  Abdomen: Abdominal distention.  Bowel sounds present Musculoskeletal: no clubbing / cyanosis. No joint deformity upper and lower extremities. Good ROM, no contractures. Normal muscle tone.  Skin: no rashes, lesions, ulcers. No induration Neurologic: CN 2-12 grossly intact. Sensation intact, DTR normal. Strength 5/5 in all 4.  Psychiatric: Normal judgment and insight. Alert and oriented x 3. Normal mood.     Labs on Admission: I have personally reviewed following labs and imaging studies  CBC: Recent Labs  Lab 10/14/20 0123 10/14/20 0129  WBC 30.6*  --   NEUTROABS 19.2*  --   HGB 12.5* 13.6  HCT 36.9* 40.0  MCV 107.9*  --   PLT 162  --    Basic Metabolic Panel: Recent Labs  Lab 10/14/20 0123 10/14/20 0129  NA 130* 130*  K 3.8 3.8  CL 96* 96*  CO2 18*  --   GLUCOSE 145* 138*  BUN 57* 59*  CREATININE 5.89* 5.60*  CALCIUM 4.5*  --    GFR: Estimated Creatinine Clearance: 10.4 mL/min (A) (by C-G formula based on SCr of 5.6 mg/dL (H)). Liver Function Tests: Recent Labs  Lab 10/14/20 0123  AST 86*  ALT 68*  ALKPHOS 56  BILITOT 0.9  PROT 5.8*  ALBUMIN 2.8*   No results for input(s): LIPASE, AMYLASE in the last 168 hours. No results for input(s): AMMONIA in the last 168 hours. Coagulation Profile: Recent Labs  Lab 10/14/20 0123  INR 1.4*   Cardiac Enzymes: No  results for input(s): CKTOTAL, CKMB, CKMBINDEX, TROPONINI in the last 168 hours. BNP (last 3 results) No results for input(s): PROBNP in the last 8760 hours. HbA1C: No results for input(s): HGBA1C in the last 72 hours. CBG: Recent Labs  Lab 10/14/20 0119  GLUCAP 139*   Lipid Profile: No results for input(s): CHOL, HDL, LDLCALC, TRIG, CHOLHDL, LDLDIRECT in the last 72 hours. Thyroid Function Tests: No results for input(s): TSH, T4TOTAL, FREET4, T3FREE, THYROIDAB in the last 72 hours. Anemia Panel: No results for input(s): VITAMINB12, FOLATE, FERRITIN, TIBC, IRON, RETICCTPCT in the last 72 hours. Urine analysis:    Component Value Date/Time   COLORURINE AMBER (A) 10/14/2020 0449   APPEARANCEUR CLOUDY (A) 10/14/2020 0449   LABSPEC 1.024 10/14/2020 0449   PHURINE 5.0 10/14/2020 0449   GLUCOSEU NEGATIVE 10/14/2020 0449   HGBUR NEGATIVE 10/14/2020 0449   BILIRUBINUR SMALL (A) 10/14/2020 0449   KETONESUR NEGATIVE 10/14/2020 0449   PROTEINUR 30 (A) 10/14/2020 0449   NITRITE NEGATIVE 10/14/2020 0449   LEUKOCYTESUR NEGATIVE 10/14/2020 0449   Sepsis Labs: Recent Results (from the past 240 hour(s))  Resp Panel by RT-PCR (Flu A&B, Covid) Nasopharyngeal Swab     Status: Abnormal   Collection Time: 10/14/20  2:21 AM   Specimen: Nasopharyngeal Swab; Nasopharyngeal(NP) swabs in vial transport medium  Result Value Ref Range Status   SARS Coronavirus 2 by RT PCR POSITIVE (A) NEGATIVE Final    Comment: RESULT CALLED TO, READ BACK BY AND VERIFIED WITH: G CERER RN 10/14/20 0349 JDW (NOTE) SARS-CoV-2 target nucleic acids are DETECTED.  The SARS-CoV-2 RNA is generally detectable in upper respiratory specimens during the acute phase of infection. Positive results are indicative of the presence of the identified virus, but do not rule out bacterial infection or co-infection with other pathogens not detected by the test. Clinical correlation with patient history and other diagnostic information  is necessary to determine patient infection status. The expected result is Negative.  Fact Sheet for Patients: EntrepreneurPulse.com.au  Fact Sheet for Healthcare Providers: IncredibleEmployment.be  This test is not yet approved or cleared by the [place] FDA and  has been authorized for detection and/or diagnosis of SARS-CoV-2 by FDA under an Emergency Use Authorization (EUA).  This EUA will remain in effect (meaning this test can be used)  for the duration of  the COVID-19 declaration under Section 564(b)(1) of the Act, 21 U.S.C. section 360bbb-3(b)(1), unless the authorization is terminated or revoked sooner.     Influenza A by  PCR NEGATIVE NEGATIVE Final   Influenza B by PCR NEGATIVE NEGATIVE Final    Comment: (NOTE) The Xpert Xpress SARS-CoV-2/FLU/RSV plus assay is intended as an aid in the diagnosis of influenza from Nasopharyngeal swab specimens and should not be used as a sole basis for treatment. Nasal washings and aspirates are unacceptable for Xpert Xpress SARS-CoV-2/FLU/RSV testing.  Fact Sheet for Patients: EntrepreneurPulse.com.au  Fact Sheet for Healthcare Providers: IncredibleEmployment.be  This test is not yet approved or cleared by the [place] FDA and has been authorized for detection and/or diagnosis of SARS-CoV-2 by FDA under an Emergency Use Authorization (EUA). This EUA will remain in effect (meaning this test can be used) for the duration of the COVID-19 declaration under Section 564(b)(1) of the Act, 21 U.S.C. section 360bbb-3(b)(1), unless the authorization is terminated or revoked.  Performed at [facility], [place] [address], [place],  [address]      Radiological Exams on Admission: DG Chest Port 1 View  Result Date: 10/14/2020 CLINICAL DATA:  COVID positive EXAM: PORTABLE CHEST 1 VIEW COMPARISON:  06/30/2017 chest radiograph. FINDINGS: Stable  cardiomediastinal silhouette with normal heart size. No pneumothorax. No pleural effusion. No overt pulmonary edema. Patchy left lung base opacity. IMPRESSION: Patchy left lung base opacity, suggesting aspiration or pneumonia. Electronically Signed   By: Ilona Sorrel M.D.   On: 10/14/2020 07:39   CT HEAD CODE STROKE WO CONTRAST  Result Date: 10/14/2020 CLINICAL DATA:  Code stroke.  Suspected TIA EXAM: CT HEAD WITHOUT CONTRAST TECHNIQUE: Contiguous axial images were obtained from the base of the skull through the vertex without intravenous contrast. COMPARISON:  None. FINDINGS: Brain: There is no mass, hemorrhage or extra-axial collection. The size and configuration of the ventricles and extra-axial CSF spaces are normal. The brain parenchyma is normal, without evidence of acute or chronic infarction. Vascular: No abnormal hyperdensity of the major intracranial arteries or dural venous sinuses. No intracranial atherosclerosis. Skull: The visualized skull base, calvarium and extracranial soft tissues are normal. Sinuses/Orbits: No fluid levels or advanced mucosal thickening of the visualized paranasal sinuses. No mastoid or middle ear effusion. The orbits are normal. ASPECTS [facility] Stroke Program Early CT Score) - Ganglionic level infarction (caudate, lentiform nuclei, internal capsule, insula, M1-M3 cortex): 7 - Supraganglionic infarction (M4-M6 cortex): 3 Total score (0-10 with 10 being normal): 10 IMPRESSION: 1. Normal head CT. 2. ASPECTS is 10. 3. These results were communicated to Dr. Donnetta Simpers at 1:45 am on 10/14/2020 by telephone. Electronically Signed   By: Ulyses Jarred M.D.   On: 10/14/2020 01:46    EKG: Independently reviewed.  Atrial fibrillation at 97 bpm with QTc 516.  Assessment/Plan Severe sepsis secondary to pneumonia due to COVID-19: Acute.  Patient presents after recently being found to be COVID-19 +3 days ago while out of town.  Noted to have complaints of abdominal pain,  constipation, and subsequently diarrhea.  Chest x-ray showing signs of a left lower lobe opacity concerning for pneumonia.  Laboratory markers were not initially obtained. -Admit to progressive bed -COVID-19 order set utilized -Continuous pulse oximetry with nasal cannula oxygen to maintain O2 saturation greater than 90% -Check blood culture -Check inflammatory markers to -Remdesivir IV day 1 of 5 -Albuterol inhaler -Solu-Medrol IV -Empiric antibiotics of Rocephin IV -Antitussives as needed -Vitamin C and zinc  Facial weakness: Acute.  Patient's wife reported that he had some left-sided facial weakness, but documentation reports right-sided facial weakness.  Symptoms appear now resolved.  Initial CT imaging negative for  any signs of acute stroke.  Suspect possibility of TIA related with low flow state from severe dehydration. -Neurochecks -Check hemoglobin A1c and lipid panel -Follow-up MRI once able to be obtained -Appreciate neurology consultative services, we will follow-up for further recommendations  Hypotension: Acute.  Patient noted to have severe diarrhea with poor fluid intake.  Suspected secondary to COVID-19 infection.  Blood pressures initially noted to be as low as 66/53 with temporary improvement with IV fluids.  Patient had received 4.5 L of normal saline IV fluids upon the ED.  Blood pressure started to trend back down.  Home blood pressure medications include metoprolol 25 mg q. evening and Entresto 24-26 mg twice daily. -Hold home blood pressure medication -Give additional 1 L bolus of normal saline IV fluids and placed on a rate of 100  mL/h -Goal MAP of 65   Acute renal failure chronic kidney disease stage IIIa: Patient presents with creatinine elevated up to 5.9 with BUN 57.  Creatinine previously noted to be around 1.19 in 02/2020.  Symptoms secondary to severe diarrhea and dehydration. -Strict intake and output -Hold nephrotoxic agents -IV fluids as seen  above  Elevated troponin: High-sensitivity troponins 16.6 and INR 1.4.  EKG noting frequent PVCs and nonsustained runs of SVT -Continue to monitor  Acute metabolic encephalopathy: Patient's wife found him to be talking gibberish this morning.  He appears to be more alert and oriented at this time since receiving initial IV fluid boluses. -Neurochecks as seen above  Paroxysmal atrial fibrillation on chronic anticoagulation: Chronic.  Patient noted to be in atrial fibrillation with heart rates currently in the low 100s.  Patient previously required ablation. -Continue Eliquis -Holding metoprolol due to hypotension   Systolic congestive heart failure: Chronic.  Patient appears to be significantly hypovolemic at this time with no significant signs of overload on physical exam.  Last EF noted to be around 45% by echocardiogram in 08/2019. -Strict intake and output -Daily weights   Hypocalcemia: Acute.  Patient noted to have calcium severely low at 4.5 with ionized calcium 0.55 on admission.  Received 1 g of calcium gluconate in the ED. -Give additional 2 g of calcium gluconate -Continue to monitor and replace as needed  Hyponatremia: Acute.  Sodium noted to be 130 on admission.  Suspect secondary to severe dehydration. -Continue to monitor  Macrocytic anemia: Patient with known history of vitamin B12 deficiency.  Initial hemoglobin 12.5 with MCV 107.9 and MCH 36.5. -Continue vitamin B-12 supplementation  Protein calorie malnutrition: Acute.  Initial albumin noted to be low at 2.8. -Check prealbumin to calculate the severity  DVT prophylaxis: Eliquis Code Status: Full Family Communication: Wife updated over the phone Disposition Plan: To be determined Consults called: PCCM Admission status: Inpatient  Norval Morton MD [facility]   If 7PM-7AM, please contact night-coverage   10/14/2020, 8:06 AM

## 2020-10-14 NOTE — ED Notes (Signed)
Pt having 8 beat run V tach- notified MD. Pt continues to have short bursts of these.

## 2020-10-15 ENCOUNTER — Inpatient Hospital Stay (HOSPITAL_COMMUNITY): Payer: Medicare Other

## 2020-10-15 ENCOUNTER — Inpatient Hospital Stay (HOSPITAL_COMMUNITY): Payer: BLUE CROSS/BLUE SHIELD

## 2020-10-15 ENCOUNTER — Other Ambulatory Visit (HOSPITAL_COMMUNITY): Payer: BLUE CROSS/BLUE SHIELD

## 2020-10-15 DIAGNOSIS — G459 Transient cerebral ischemic attack, unspecified: Secondary | ICD-10-CM

## 2020-10-15 DIAGNOSIS — R6520 Severe sepsis without septic shock: Secondary | ICD-10-CM | POA: Diagnosis not present

## 2020-10-15 DIAGNOSIS — A419 Sepsis, unspecified organism: Secondary | ICD-10-CM | POA: Diagnosis not present

## 2020-10-15 LAB — FERRITIN: Ferritin: 1376 ng/mL — ABNORMAL HIGH (ref 24–336)

## 2020-10-15 LAB — COMPREHENSIVE METABOLIC PANEL
ALT: 48 U/L — ABNORMAL HIGH (ref 0–44)
AST: 65 U/L — ABNORMAL HIGH (ref 15–41)
Albumin: 2.1 g/dL — ABNORMAL LOW (ref 3.5–5.0)
Alkaline Phosphatase: 53 U/L (ref 38–126)
Anion gap: 17 — ABNORMAL HIGH (ref 5–15)
BUN: 72 mg/dL — ABNORMAL HIGH (ref 8–23)
CO2: 13 mmol/L — ABNORMAL LOW (ref 22–32)
Calcium: 5.1 mg/dL — CL (ref 8.9–10.3)
Chloride: 101 mmol/L (ref 98–111)
Creatinine, Ser: 6.34 mg/dL — ABNORMAL HIGH (ref 0.61–1.24)
GFR, Estimated: 8 mL/min — ABNORMAL LOW (ref >60)
Glucose, Bld: 107 mg/dL — ABNORMAL HIGH (ref 70–99)
Potassium: 3.4 mmol/L — ABNORMAL LOW (ref 3.5–5.1)
Sodium: 131 mmol/L — ABNORMAL LOW (ref 135–145)
Total Bilirubin: 0.6 mg/dL (ref 0.3–1.2)
Total Protein: 4.2 g/dL — ABNORMAL LOW (ref 6.5–8.1)

## 2020-10-15 LAB — CBC WITH DIFFERENTIAL/PLATELET
Basophils Absolute: 0 10*3/uL (ref 0.0–0.1)
Basophils Relative: 0 %
Eosinophils Absolute: 0 10*3/uL (ref 0.0–0.5)
Eosinophils Relative: 0 %
HCT: 34.3 % — ABNORMAL LOW (ref 39.0–52.0)
Hemoglobin: 11.2 g/dL — ABNORMAL LOW (ref 13.0–17.0)
Lymphocytes Relative: 2 %
Lymphs Abs: 0.5 10*3/uL — ABNORMAL LOW (ref 0.7–4.0)
MCH: 35.2 pg — ABNORMAL HIGH (ref 26.0–34.0)
MCHC: 32.7 g/dL (ref 30.0–36.0)
MCV: 107.9 fL — ABNORMAL HIGH (ref 80.0–100.0)
Monocytes Absolute: 10.1 10*3/uL — ABNORMAL HIGH (ref 0.1–1.0)
Monocytes Relative: 25 %
Neutro Abs: 30.5 10*3/uL — ABNORMAL HIGH (ref 1.7–7.7)
Neutrophils Relative %: 73 %
Platelets: 157 10*3/uL (ref 150–400)
RBC: 3.18 MIL/uL — ABNORMAL LOW (ref 4.22–5.81)
RDW: 14.3 % (ref 11.5–15.5)
WBC: 42.7 10*3/uL — ABNORMAL HIGH (ref 4.0–10.5)

## 2020-10-15 LAB — C DIFFICILE QUICK SCREEN W PCR REFLEX
C Diff antigen: NEGATIVE
C Diff interpretation: NOT DETECTED
C Diff toxin: NEGATIVE

## 2020-10-15 LAB — D-DIMER, QUANTITATIVE: D-Dimer, Quant: 7.77 ug/mL-FEU — ABNORMAL HIGH (ref 0.00–0.50)

## 2020-10-15 LAB — MAGNESIUM: Magnesium: 2.2 mg/dL (ref 1.7–2.4)

## 2020-10-15 LAB — MRSA PCR SCREENING: MRSA by PCR: NEGATIVE

## 2020-10-15 LAB — SODIUM, URINE, RANDOM: Sodium, Ur: <10 mmol/L

## 2020-10-15 LAB — PROCALCITONIN: Procalcitonin: 5.45 ng/mL

## 2020-10-15 LAB — BRAIN NATRIURETIC PEPTIDE: B Natriuretic Peptide: 238.6 pg/mL — ABNORMAL HIGH (ref 0.0–100.0)

## 2020-10-15 LAB — PHOSPHORUS: Phosphorus: 6.1 mg/dL — ABNORMAL HIGH (ref 2.5–4.6)

## 2020-10-15 LAB — CREATININE, URINE, RANDOM: Creatinine, Urine: 229.03 mg/dL

## 2020-10-15 LAB — C-REACTIVE PROTEIN: CRP: 36.3 mg/dL — ABNORMAL HIGH (ref <1.0)

## 2020-10-15 MED ORDER — BISACODYL 10 MG RE SUPP
10.0000 mg | Freq: Every day | RECTAL | Status: DC
  Filled 2020-10-15: qty 1

## 2020-10-15 MED ORDER — MELATONIN 3 MG PO TABS
3.0000 mg | ORAL_TABLET | Freq: Every evening | ORAL | Status: DC | PRN
  Administered 2020-10-16 (×2): 3 mg via ORAL
  Filled 2020-10-15 (×2): qty 1

## 2020-10-15 MED ORDER — POLYETHYLENE GLYCOL 3350 17 G PO PACK: 17.0000 g | PACK | Freq: Every day | ORAL | Status: DC | PRN

## 2020-10-15 MED ORDER — GERHARDT'S BUTT CREAM
TOPICAL_CREAM | Freq: Three times a day (TID) | CUTANEOUS | Status: DC
  Administered 2020-10-17 – 2020-11-06 (×5): 1 via TOPICAL
  Filled 2020-10-15 (×3): qty 1

## 2020-10-15 MED ORDER — SODIUM CHLORIDE 0.9 % IV SOLN
100.0000 mg | Freq: Two times a day (BID) | INTRAVENOUS | Status: DC
  Administered 2020-10-15 – 2020-10-16 (×4): 100 mg via INTRAVENOUS
  Filled 2020-10-15 (×6): qty 100

## 2020-10-15 MED ORDER — GERHARDT'S BUTT CREAM
TOPICAL_CREAM | Freq: Two times a day (BID) | CUTANEOUS | Status: DC
  Filled 2020-10-15: qty 1

## 2020-10-15 MED ORDER — CALCIUM GLUCONATE-NACL 2-0.675 GM/100ML-% IV SOLN
2.0000 g | Freq: Once | INTRAVENOUS | Status: AC
  Administered 2020-10-15: 2000 mg via INTRAVENOUS
  Filled 2020-10-15: qty 100

## 2020-10-15 MED ORDER — SODIUM CHLORIDE 0.9 % IV SOLN
3.0000 g | INTRAVENOUS | Status: DC
  Administered 2020-10-15 – 2020-10-17 (×3): 3 g via INTRAVENOUS
  Filled 2020-10-15: qty 8
  Filled 2020-10-15 (×2): qty 3
  Filled 2020-10-15: qty 8

## 2020-10-15 MED ORDER — CHLORHEXIDINE GLUCONATE CLOTH 2 % EX PADS
6.0000 | MEDICATED_PAD | Freq: Every day | CUTANEOUS | Status: DC
  Administered 2020-10-16 – 2020-10-24 (×10): 6 via TOPICAL

## 2020-10-15 MED ORDER — METOPROLOL TARTRATE 25 MG PO TABS
25.0000 mg | ORAL_TABLET | Freq: Once | ORAL | Status: AC
  Administered 2020-10-15: 25 mg via ORAL
  Filled 2020-10-15: qty 1

## 2020-10-15 MED ORDER — PANTOPRAZOLE SODIUM 40 MG PO TBEC
40.0000 mg | DELAYED_RELEASE_TABLET | Freq: Every day | ORAL | Status: DC
  Administered 2020-10-15 – 2020-10-16 (×2): 40 mg via ORAL
  Filled 2020-10-15 (×2): qty 1

## 2020-10-15 MED ORDER — HEPARIN (PORCINE) 25000 UT/250ML-% IV SOLN
1300.0000 [IU]/h | INTRAVENOUS | Status: DC
  Administered 2020-10-15: 1300 [IU]/h via INTRAVENOUS
  Filled 2020-10-15: qty 250

## 2020-10-15 MED ORDER — METHYLPREDNISOLONE SODIUM SUCC 125 MG IJ SOLR
60.0000 mg | Freq: Two times a day (BID) | INTRAMUSCULAR | Status: DC
  Administered 2020-10-15 – 2020-10-18 (×7): 60 mg via INTRAVENOUS
  Filled 2020-10-15 (×7): qty 2

## 2020-10-15 MED ORDER — DOCUSATE SODIUM 100 MG PO CAPS
200.0000 mg | ORAL_CAPSULE | Freq: Two times a day (BID) | ORAL | Status: DC
  Administered 2020-10-15: 200 mg via ORAL
  Filled 2020-10-15: qty 2

## 2020-10-15 MED ORDER — POLYETHYLENE GLYCOL 3350 17 G PO PACK: 17.0000 g | PACK | Freq: Two times a day (BID) | ORAL | Status: DC

## 2020-10-15 MED ORDER — ALBUMIN HUMAN 25 % IV SOLN
25.0000 g | Freq: Once | INTRAVENOUS | Status: AC
  Administered 2020-10-16 (×2): 12.5 g via INTRAVENOUS
  Filled 2020-10-15: qty 100

## 2020-10-15 MED ORDER — LOPERAMIDE HCL 2 MG PO CAPS: 2.0000 mg | ORAL_CAPSULE | Freq: Four times a day (QID) | ORAL | Status: DC | PRN

## 2020-10-15 MED ORDER — POTASSIUM CHLORIDE CRYS ER 20 MEQ PO TBCR
40.0000 meq | EXTENDED_RELEASE_TABLET | Freq: Once | ORAL | Status: AC
  Administered 2020-10-15: 40 meq via ORAL
  Filled 2020-10-15: qty 2

## 2020-10-15 NOTE — Consult Note (Addendum)
Renal Service Consult Note [facility]  Raymond Hurley. 10/15/2020 Raymond Blazing, MD Requesting Physician: Dr. Candiss Hurley  Reason for Consult: Renal failure HPI: The patient is a 87 y.o. year-old w/ hx of syst CHF EF 45%, atrial fibrillation, melanoma presenting w/ stroke like symptoms. He had fevers 4 days prior and cough, they were out of town and COVID test was positive. Came back to [place]. Fully vaccinated. Has had some severe diarrhea at home. Then yesterday pt was not acting right and was taken to ED.  Code stroke called, CT neg, afeb, BP's low to low normal. SpO2 was normal. WBC 30K, creat 5.8. CXR pathcy infiltrates at L base. Got 4-5 L of LR and 1 gm Ca gluc. Admitted. Asked to see for renal failure.   Pt denies hx of kidney disease or kidney problmes.  Last creat in June 2021 was 1.19.  Denies any current SOB, prod cough. +abd Hurley / tenderness both sides.  No dysuria or voiding issues.   BP's in ED soft in 80's.  Getting IVF"s at 100 cc/hr.   ROS  denies CP  no joint Hurley   no HA  no blurry vision  no rash     Past Medical History  Past Medical History:  Diagnosis Date  . Atrial fibrillation ([place])   . Melanoma ([place])   . Vitamin B 12 deficiency    Past Surgical History  Past Surgical History:  Procedure Laterality Date  . CARDIOVERSION N/A 06/02/2019   Procedure: CARDIOVERSION;  Surgeon: Raymond Pain, MD;  Location: [facility] ENDOSCOPY;  Service: Cardiovascular;  Laterality: N/A;  . RIGHT/LEFT HEART CATH AND CORONARY ANGIOGRAPHY N/A 04/27/2019   Procedure: RIGHT/LEFT HEART CATH AND CORONARY ANGIOGRAPHY;  Surgeon: Raymond Artist, MD;  Location: [place] CV LAB;  Service: Cardiovascular;  Laterality: N/A;  . TEE WITHOUT CARDIOVERSION N/A 06/02/2019   Procedure: TRANSESOPHAGEAL ECHOCARDIOGRAM (TEE);  Surgeon: Raymond Pain, MD;  Location: [facility] ENDOSCOPY;  Service: Cardiovascular;  Laterality: N/A;   Family History No family history on file. Social  History  reports that he has never smoked. He has never used smokeless tobacco. He reports current alcohol use of about 7.0 standard drinks of alcohol per week. He reports that he does not use drugs. Allergies  Allergies  Allergen Reactions  . Povidone-Iodine Swelling  . Tape Swelling  . Bee Venom Rash  . Covid-19 Mrna Vacc (Moderna) Hives and Rash    Rash came after booster shot. Pt was fine with 1st and 2nd   Home medications Prior to Admission medications   Medication Sig Start Date End Date Taking? Authorizing Provider  acetaminophen (TYLENOL) 500 MG tablet Take 1,000 mg by mouth every 6 (six) hours as needed for mild Hurley or headache.   Yes [provider]  ELIQUIS 5 MG TABS tablet TAKE ONE TABLET TWICE DAILY Patient taking differently: Take 5 mg by mouth 2 (two) times daily. 08/17/20  Yes Raymond Perla, MD  metoprolol succinate (TOPROL-XL) 25 MG 24 hr tablet Take 1 tablet (25 mg total) by mouth every evening. 05/22/20  Yes Raymond Perla, MD  sacubitril-valsartan (ENTRESTO) 24-26 MG Take 1 tablet by mouth 2 (two) times daily. 12/07/19  Yes Raymond Perla, MD  triamcinolone cream (KENALOG) 0.1 % Apply 1 application topically 2 (two) times daily as needed (itching, rash). 04/25/20  Yes [provider]  montelukast (SINGULAIR) 10 MG tablet Take 1 tablet by mouth once daily as directed Patient not taking:  No sig reported 06/13/20   Raymond Prows, MD     Vitals:   10/15/20 0500 10/15/20 0600 10/15/20 0640 10/15/20 0800  BP: (!) 104/56 95/68 (!) 78/55 (!) 93/51  Pulse: 84 96 100 (!) 101  Resp: (!) 28 (!) 32 (!) 30 (!) 22  Temp:      TempSrc:      SpO2: 93% 92% (!) 83% 94%  Weight:      Height:       Exam Gen elderly WM, a bit frail, not in distress, BP''s low 80's- 90's No rash, cyanosis or gangrene Sclera anicteric, throat clear  +JVD Chest clear bilat no rales or wheezing RRR no MRG Abd poss ascites ,nontender , +BS GU normal male MS no joint  effusions or deformity Ext trace pretib edema, no wounds or ulcers Neuro is alert, Ox 3 , nf, no asterixis    Home meds:  - eliquis 5 bid/ metoprolol xl 25 hs/ entresto 24-26 bid  - singulair qd  - prn's/ vitamins/ supplements    UA 1/1 - 30 prot, 0-5 wbc/ rbc, rare bact   CXR - IMPRESSION: 1. Stable small left pleural effusion. 2. Left retrocardiac opacity with increased volume loss, indicating left lower lobe atelectasis with possible component of pneumonia.   B/l creat 1.01 Mar 2020     Na 131  K 3.4  CO2 13  AG 17  BUN 72  Cr 6.34  Alb 2.1      WBC 42K  HB 11.2  plt 157     Assessment/ Plan: 1. Acute renal failure - severe, creat 6. Baseline creat 1.19 in June 2021. No uremic symptoms. UA unremarkable.  Need renal US, urine lytes, place foley cath. Getting IVF"s at 100/hr, would continue. He had several days of diarrhea at home, likely COVID related.  AKI related to hypotension/ hypovolemia/ entresto. No indication for RRT at this time. Cont supportive care.  Will follow.  2. Chron syst HF - hold entresto and any other BP lowering meds for now 3. Vol depletion 4. COVID -19 infection - prob PNA per CXR, not hypoxemic.  5. Atrial fib - per pmd      Raymond Splinter  MD 10/15/2020, 10:48 AM  Recent Labs  Lab 10/14/20 0123 10/14/20 0129 10/15/20 0445  WBC 30.6*  --  42.7*  HGB 12.5* 13.6 11.2*   Recent Labs  Lab 10/14/20 0909 10/15/20 0445  K 3.4* 3.4*  BUN 56* 72*  CREATININE 5.08* 6.34*  CALCIUM 4.5* 5.1*  PHOS  --  6.1*

## 2020-10-15 NOTE — ED Notes (Signed)
SDU Breakfast Ordered 

## 2020-10-15 NOTE — ED Notes (Signed)
Pt cleaned after another liquid bowel movement, complaining of raw skin pain. Barrier cream applied.   Pt moved to recliner with assist of one.  Continues to complain of abdominal bloating feeling

## 2020-10-15 NOTE — Progress Notes (Signed)
VASCULAR LAB    Carotid duplex has been performed.  See CV proc for preliminary results.   Mavis Fichera, RVT 10/15/2020, 1:30 PM

## 2020-10-15 NOTE — Progress Notes (Addendum)
PROGRESS NOTE                                                                                                                                                                                                             Patient Demographics:    Raymond Hurley, is a 87 y.o. male, DOB - December 12, 1930, VOZ:[id number]  Outpatient Primary MD for the patient is Lajean Manes, MD    LOS - 1  Admit date - 10/14/2020    Chief Complaint  Patient presents with  . Code Stroke       Brief Narrative (HPI from H&P)  Raymond Riera. is a 87 y.o. male with medical history significant of atrial fibrillation on chronic anticoagulation, systolic congestive heart failure last EF 45%, melanoma, and vitamin B12 deficiency presents after being found to be acutely altered, in the ER he was diagnosed with COVID-19 pneumonia, aspiration pneumonia, there was suspicion that he had possible TIA causing transient right facial droop, he was found to be septic, hypotensive with AKI and admitted to the hospital   Subjective:    Raymond Hurley today has, No headache, No chest pain, No abdominal pain - No Nausea, No new weakness tingling or numbness,  Mild SOB.   Assessment  & Plan :    1. Acute Hypoxic Resp. Failure due to Acute Covid 19 Viral Pneumonitis along with possible aspiration pneumonia causing sepsis - he is fully vaccinated including the booster dose, I think his major problem right now is COVID-19 causing inflammation with toxic encephalopathy resulting in aspiration pneumonia in full-blown sepsis -  Follow cultures, procalcitonin, hydrate with IV fluids, place him on Unasyn and doxycycline along with IV steroids, COVID-19 is not likely affecting his lungs at this point.  Poor candidate for Actemra or Baricitinib anyways due to concurrent bacterial infection.  Placed him on steroids.  PCCM is on board as well.  Extremely tenuous.  Encouraged  the patient to sit up in chair in the daytime use I-S and flutter valve for pulmonary toiletry and then prone in bed when at night.  Will advance activity and titrate down oxygen as possible.       SpO2: 94 %  Recent Labs  Lab 10/14/20 0123 10/14/20 0129 10/14/20 0144 10/14/20 0221 10/14/20 0412 10/14/20 0909 10/15/20 0445  WBC 30.6*  --   --   --   --   --  42.7*  HGB 12.5* 13.6  --   --   --   --  11.2*  HCT 36.9* 40.0  --   --   --   --  34.3*  PLT 162  --   --   --   --   --  157  CRP  --   --   --   --   --  32.0* 36.3*  BNP  --   --  137.7*  --   --   --   --   DDIMER  --   --   --   --   --  5.74* 7.77*  PROCALCITON  --   --   --   --   --  2.87  --   AST 86*  --   --   --   --   --  65*  ALT 68*  --   --   --   --   --  48*  ALKPHOS 56  --   --   --   --   --  53  BILITOT 0.9  --   --   --   --   --  0.6  ALBUMIN 2.8*  --   --   --   --   --  2.1*  INR 1.4*  --   --   --   --   --   --   LATICACIDVEN  --   --  2.5*  --  1.8  --   --   SARSCOV2NAA  --   --   --  POSITIVE*  --   --   --     2.  Sepsis and dehydration causing profound AKI.  Hydrate with IV fluids, steroids IV, check renal ultrasound, nephrology on board.  3.  Constipation.  Placed on bowel regimen   4.  History of urinary retention.  Monitor bladder scans.  5.  Hypokalemia.  Replaced.  6.  Transient facial droop on the left side.  Resolved MRI negative.  Already on Eliquis >> Hep Gtt.  7.  Accessible A. fib.  Mali vas 2 score of greater than 3.  Currently on Eliquis.  8.  History of chronic systolic heart failure.  Last EF 45%.  Currently profoundly dehydrated and hypotensive requiring IV fluids.  Repeat echo pending.  9.  Hypocalcemia.  Replace IV.  10. Psych encephalopathy.  Supportive care, minimize narcotics and benzodiazepines.  As needed Haldol if needed.  11.  Mild elevation of troponin.  Non-ACS pattern, no chest pain, likely due to profound hypotension causing demand mismatch.  Echo  to evaluate wall motion and EF, on Eliquis      Condition - Extremely Guarded  Family Communication  :  Wife Raymond Hurley [phone] on 10/15/20  Code Status :    Consults  :  Renal, PCCM  Procedures  :    TTE  Renal US  MRI - 1. Negative for acute infarct. Mild chronic microvascular ischemic change in the white matter. 2. Negative MRA  PUD Prophylaxis : PPI  Disposition Plan  :    Status is: Inpatient  Remains inpatient appropriate because:IV treatments appropriate due to intensity of illness or inability to take PO   Dispo: The patient is from: Home              Anticipated d/c is to: SNF              Anticipated  d/c date is: > 3 days              Patient currently is not medically stable to d/c.   DVT Prophylaxis  : Eliquis  Lab Results  Component Value Date   PLT 157 10/15/2020    Diet :  Diet Order            Diet Heart Room service appropriate? Yes; Fluid consistency: Thin  Diet effective now                  Inpatient Medications  Scheduled Meds: . albuterol  2 puff Inhalation Q6H  . apixaban  5 mg Oral BID  . vitamin C  500 mg Oral Daily  . bisacodyl  10 mg Rectal Daily  . docusate sodium  200 mg Oral BID  . Pedialyte  1,000 mL Oral Q4H  . polyethylene glycol  17 g Oral BID  . potassium chloride  40 mEq Oral Once  . sodium chloride flush  3 mL Intravenous Q12H  . zinc sulfate  220 mg Oral Daily   Continuous Infusions: . sodium chloride 100 mL/hr at 10/15/20 0455  . cefTRIAXone (ROCEPHIN)  IV Stopped (10/14/20 1003)   PRN Meds:.acetaminophen, chlorpheniramine-HYDROcodone, guaiFENesin-dextromethorphan, triamcinolone  Antibiotics  :    Anti-infectives (From admission, onward)   Start     Dose/Rate Route Frequency Ordered Stop   10/15/20 1000  remdesivir 100 mg in sodium chloride 0.9 % 100 mL IVPB  Status:  Discontinued       "Followed by" Linked Group Details   100 mg 200 mL/hr over 30 Minutes Intravenous Daily 10/14/20 0828 10/14/20 1324    10/14/20 0830  remdesivir 200 mg in sodium chloride 0.9% 250 mL IVPB       "Followed by" Linked Group Details   200 mg 580 mL/hr over 30 Minutes Intravenous Once 10/14/20 0828 10/14/20 1100   10/14/20 0830  cefTRIAXone (ROCEPHIN) 2 g in sodium chloride 0.9 % 100 mL IVPB        2 g 200 mL/hr over 30 Minutes Intravenous Every 24 hours 10/14/20 0829 10/19/20 0829       Time Spent in minutes  30   Lala Lund M.D on 10/15/2020 at 9:43 AM  To page go to [email or web address]   [facility] -  Office  [phone]     See all Orders from today for further details    Objective:   Vitals:   10/15/20 0500 10/15/20 0600 10/15/20 0640 10/15/20 0800  BP: (!) 104/56 95/68 (!) 78/55 (!) 93/51  Pulse: 84 96 100 (!) 101  Resp: (!) 28 (!) 32 (!) 30 (!) 22  Temp:      TempSrc:      SpO2: 93% 92% (!) 83% 94%  Weight:      Height:        Wt Readings from Last 3 Encounters:  10/14/20 95.1 kg  07/05/20 95.1 kg  06/13/20 96.2 kg    No intake or output data in the 24 hours ending 10/15/20 0943   Physical Exam  Awake Alert, No new F.N deficits, Normal affect [place].AT,PERRAL Supple Neck,No JVD, No cervical lymphadenopathy appriciated.  Symmetrical Chest wall movement, Good air movement bilaterally, CTAB RRR,No Gallops,Rubs or new Murmurs, No Parasternal Heave +ve B.Sounds, Abd Soft, No tenderness, No organomegaly appriciated, No rebound - guarding or rigidity. No Cyanosis, Clubbing or edema, No new Rash or bruise      Data Review:    CBC Recent Labs  Lab 10/14/20 0123 10/14/20 0129 10/15/20 0445  WBC 30.6*  --  42.7*  HGB 12.5* 13.6 11.2*  HCT 36.9* 40.0 34.3*  PLT 162  --  157  MCV 107.9*  --  107.9*  MCH 36.5*  --  35.2*  MCHC 33.9  --  32.7  RDW 14.3  --  14.3  LYMPHSABS 0.7  --  0.5*  MONOABS 9.6*  --  10.1*  EOSABS 0.0  --  0.0  BASOSABS 0.0  --  0.0    Recent Labs  Lab 10/14/20 0123 10/14/20 0129 10/14/20 0144 10/14/20 0412 10/14/20 0909  10/15/20 0445  NA 130* 130*  --   --  129* 131*  K 3.8 3.8  --   --  3.4* 3.4*  CL 96* 96*  --   --  97* 101  CO2 18*  --   --   --  19* 13*  GLUCOSE 145* 138*  --   --  103* 107*  BUN 57* 59*  --   --  56* 72*  CREATININE 5.89* 5.60*  --   --  5.08* 6.34*  CALCIUM 4.5*  --   --   --  4.5* 5.1*  AST 86*  --   --   --   --  65*  ALT 68*  --   --   --   --  48*  ALKPHOS 56  --   --   --   --  53  BILITOT 0.9  --   --   --   --  0.6  ALBUMIN 2.8*  --   --   --   --  2.1*  MG  --   --   --   --  1.2* 2.2  CRP  --   --   --   --  32.0* 36.3*  DDIMER  --   --   --   --  5.74* 7.77*  PROCALCITON  --   --   --   --  2.87  --   LATICACIDVEN  --   --  2.5* 1.8  --   --   INR 1.4*  --   --   --   --   --   HGBA1C  --   --   --   --  5.7*  --   BNP  --   --  137.7*  --   --   --     ------------------------------------------------------------------------------------------------------------------ Recent Labs    10/14/20 0909  CHOL 117  HDL 44  LDLCALC 57  TRIG 82  CHOLHDL 2.7    Lab Results  Component Value Date   HGBA1C 5.7 (H) 10/14/2020   ------------------------------------------------------------------------------------------------------------------ No results for input(s): TSH, T4TOTAL, T3FREE, THYROIDAB in the last 72 hours.  Invalid input(s): FREET3  Cardiac Enzymes No results for input(s): CKMB, TROPONINI, MYOGLOBIN in the last 168 hours.  Invalid input(s): CK ------------------------------------------------------------------------------------------------------------------    Component Value Date/Time   BNP 137.7 (H) 10/14/2020 0144    Micro Results Recent Results (from the past 240 hour(s))  Resp Panel by RT-PCR (Flu A&B, Covid) Nasopharyngeal Swab     Status: Abnormal   Collection Time: 10/14/20  2:21 AM   Specimen: Nasopharyngeal Swab; Nasopharyngeal(NP) swabs in vial transport medium  Result Value Ref Range Status   SARS Coronavirus 2 by RT PCR POSITIVE  (A) NEGATIVE Final    Comment: RESULT CALLED TO, READ BACK BY AND VERIFIED WITH: Santo Held RN 10/14/20  0349 JDW (NOTE) SARS-CoV-2 target nucleic acids are DETECTED.  The SARS-CoV-2 RNA is generally detectable in upper respiratory specimens during the acute phase of infection. Positive results are indicative of the presence of the identified virus, but do not rule out bacterial infection or co-infection with other pathogens not detected by the test. Clinical correlation with patient history and other diagnostic information is necessary to determine patient infection status. The expected result is Negative.  Fact Sheet for Patients: EntrepreneurPulse.com.au  Fact Sheet for Healthcare Providers: IncredibleEmployment.be  This test is not yet approved or cleared by the [place] FDA and  has been authorized for detection and/or diagnosis of SARS-CoV-2 by FDA under an Emergency Use Authorization (EUA).  This EUA will remain in effect (meaning this test can be used)  for the duration of  the COVID-19 declaration under Section 564(b)(1) of the Act, 21 U.S.C. section 360bbb-3(b)(1), unless the authorization is terminated or revoked sooner.     Influenza A by PCR NEGATIVE NEGATIVE Final   Influenza B by PCR NEGATIVE NEGATIVE Final    Comment: (NOTE) The Xpert Xpress SARS-CoV-2/FLU/RSV plus assay is intended as an aid in the diagnosis of influenza from Nasopharyngeal swab specimens and should not be used as a sole basis for treatment. Nasal washings and aspirates are unacceptable for Xpert Xpress SARS-CoV-2/FLU/RSV testing.  Fact Sheet for Patients: EntrepreneurPulse.com.au  Fact Sheet for Healthcare Providers: IncredibleEmployment.be  This test is not yet approved or cleared by the [place] FDA and has been authorized for detection and/or diagnosis of SARS-CoV-2 by FDA under an Emergency Use Authorization  (EUA). This EUA will remain in effect (meaning this test can be used) for the duration of the COVID-19 declaration under Section 564(b)(1) of the Act, 21 U.S.C. section 360bbb-3(b)(1), unless the authorization is terminated or revoked.  Performed at [facility], [place] [address], [place], [place] [address]   Culture, blood (routine x 2)     Status: None (Preliminary result)   Collection Time: 10/14/20 10:36 AM   Specimen: BLOOD RIGHT HAND  Result Value Ref Range Status   Specimen Description BLOOD RIGHT HAND  Final   Special Requests   Final    BOTTLES DRAWN AEROBIC AND ANAEROBIC Blood Culture adequate volume   Culture   Final    NO GROWTH < 24 HOURS Performed at [facility], [place] [address], [place], [place] [address]    Report Status PENDING  Incomplete  Culture, blood (routine x 2)     Status: None (Preliminary result)   Collection Time: 10/14/20 10:36 AM   Specimen: BLOOD LEFT HAND  Result Value Ref Range Status   Specimen Description BLOOD LEFT HAND  Final   Special Requests   Final    BOTTLES DRAWN AEROBIC AND ANAEROBIC Blood Culture results may not be optimal due to an inadequate volume of blood received in culture bottles   Culture   Final    NO GROWTH < 24 HOURS Performed at [facility], [place] [address], [place], [place] [address]    Report Status PENDING  Incomplete  Gastrointestinal Panel by PCR , Stool     Status: None   Collection Time: 10/14/20 11:00 AM   Specimen: Stool  Result Value Ref Range Status   Campylobacter species NOT DETECTED NOT DETECTED Final   Plesimonas shigelloides NOT DETECTED NOT DETECTED Final   Salmonella species NOT DETECTED NOT DETECTED Final   Yersinia enterocolitica NOT DETECTED NOT DETECTED Final   Vibrio  species NOT DETECTED NOT DETECTED Final   Vibrio cholerae NOT DETECTED NOT DETECTED Final   Enteroaggregative E coli (EAEC) NOT DETECTED NOT DETECTED Final   Enteropathogenic E coli (EPEC) NOT DETECTED NOT DETECTED  Final   Enterotoxigenic E coli (ETEC) NOT DETECTED NOT DETECTED Final   Shiga like toxin producing E coli (STEC) NOT DETECTED NOT DETECTED Final   Shigella/Enteroinvasive E coli (EIEC) NOT DETECTED NOT DETECTED Final   Cryptosporidium NOT DETECTED NOT DETECTED Final   Cyclospora cayetanensis NOT DETECTED NOT DETECTED Final   Entamoeba histolytica NOT DETECTED NOT DETECTED Final   Giardia lamblia NOT DETECTED NOT DETECTED Final   Adenovirus F40/41 NOT DETECTED NOT DETECTED Final   Astrovirus NOT DETECTED NOT DETECTED Final   Norovirus GI/GII NOT DETECTED NOT DETECTED Final   Rotavirus A NOT DETECTED NOT DETECTED Final   Sapovirus (I, II, IV, and V) NOT DETECTED NOT DETECTED Final    Comment: Performed at [facility], [address], [place], [place] [address]    Radiology Reports DG Abd 1 View  Result Date: 10/15/2020 CLINICAL DATA:  Constipation. EXAM: ABDOMEN - 1 VIEW COMPARISON:  10/14/2020 FINDINGS: Mild gaseous distention of the colon is likely consistent with a component of ileus. No significant retained stool. No small bowel dilatation. IMPRESSION: Probable mild colonic ileus. Electronically Signed   By: Aletta Edouard M.D.   On: 10/15/2020 09:37   DG Abd 1 View  Result Date: 10/14/2020 CLINICAL DATA:  Diarrhea EXAM: ABDOMEN - 1 VIEW COMPARISON:  None. FINDINGS: The bowel gas pattern is normal. No radio-opaque calculi or other significant radiographic abnormality are seen. IMPRESSION: Negative. Electronically Signed   By: Rolm Baptise M.D.   On: 10/14/2020 13:02   MR ANGIO HEAD WO CONTRAST  Result Date: 10/14/2020 CLINICAL DATA:  TIA. EXAM: MRI HEAD WITHOUT CONTRAST MRA HEAD WITHOUT CONTRAST TECHNIQUE: Multiplanar, multiecho pulse sequences of the brain and surrounding structures were obtained without intravenous contrast. Angiographic images of the head were obtained using MRA technique without contrast. COMPARISON:  CT head 10/14/2020 FINDINGS: MRI HEAD FINDINGS Brain:  Negative for acute infarct. Small area of T2 shine through in the left frontal white matter. No areas of restricted diffusion. Mild white matter changes with scattered small periventricular deep white matter hyperintensities bilaterally. Brainstem normal. Negative for hemorrhage or mass. Negative for hydrocephalus. Cerebral volume normal. Vascular: Normal arterial flow voids Skull and upper cervical spine: No focal skeletal abnormality. Sinuses/Orbits: Mild mucosal edema paranasal sinuses and right mastoid sinus. Normal orbit Other: None MRA HEAD FINDINGS Both vertebral arteries are widely patent and normal. Basilar widely patent. AICA, PICA widely patent. Superior cerebellar and posterior cerebral arteries normal bilaterally. Internal carotid artery widely patent bilaterally. Anterior and middle cerebral arteries normal bilaterally. Negative for cerebral aneurysm. IMPRESSION: 1. Negative for acute infarct. Mild chronic microvascular ischemic change in the white matter. 2. Negative MRA head Electronically Signed   By: Franchot Gallo M.D.   On: 10/14/2020 16:24   MR BRAIN WO CONTRAST  Result Date: 10/14/2020 CLINICAL DATA:  TIA. EXAM: MRI HEAD WITHOUT CONTRAST MRA HEAD WITHOUT CONTRAST TECHNIQUE: Multiplanar, multiecho pulse sequences of the brain and surrounding structures were obtained without intravenous contrast. Angiographic images of the head were obtained using MRA technique without contrast. COMPARISON:  CT head 10/14/2020 FINDINGS: MRI HEAD FINDINGS Brain: Negative for acute infarct. Small area of T2 shine through in the left frontal white matter. No areas of restricted diffusion. Mild white matter changes with scattered small periventricular deep  white matter hyperintensities bilaterally. Brainstem normal. Negative for hemorrhage or mass. Negative for hydrocephalus. Cerebral volume normal. Vascular: Normal arterial flow voids Skull and upper cervical spine: No focal skeletal abnormality. Sinuses/Orbits:  Mild mucosal edema paranasal sinuses and right mastoid sinus. Normal orbit Other: None MRA HEAD FINDINGS Both vertebral arteries are widely patent and normal. Basilar widely patent. AICA, PICA widely patent. Superior cerebellar and posterior cerebral arteries normal bilaterally. Internal carotid artery widely patent bilaterally. Anterior and middle cerebral arteries normal bilaterally. Negative for cerebral aneurysm. IMPRESSION: 1. Negative for acute infarct. Mild chronic microvascular ischemic change in the white matter. 2. Negative MRA head Electronically Signed   By: Franchot Gallo M.D.   On: 10/14/2020 16:24   DG Chest Port 1 View  Result Date: 10/15/2020 CLINICAL DATA:  COVID positive, dyspnea EXAM: PORTABLE CHEST 1 VIEW COMPARISON:  Chest radiograph from one day prior. FINDINGS: Stable cardiomediastinal silhouette with top-normal heart size. No pneumothorax. Small left pleural effusion, stable. No right pleural effusion. No pulmonary edema. Left retrocardiac opacity with increased volume loss. IMPRESSION: 1. Stable small left pleural effusion. 2. Left retrocardiac opacity with increased volume loss, indicating left lower lobe atelectasis with possible component of pneumonia. Electronically Signed   By: Ilona Sorrel M.D.   On: 10/15/2020 07:53   DG Chest Port 1 View  Result Date: 10/14/2020 CLINICAL DATA:  COVID positive EXAM: PORTABLE CHEST 1 VIEW COMPARISON:  06/30/2017 chest radiograph. FINDINGS: Stable cardiomediastinal silhouette with normal heart size. No pneumothorax. No pleural effusion. No overt pulmonary edema. Patchy left lung base opacity. IMPRESSION: Patchy left lung base opacity, suggesting aspiration or pneumonia. Electronically Signed   By: Ilona Sorrel M.D.   On: 10/14/2020 07:39   CT HEAD CODE STROKE WO CONTRAST  Result Date: 10/14/2020 CLINICAL DATA:  Code stroke.  Suspected TIA EXAM: CT HEAD WITHOUT CONTRAST TECHNIQUE: Contiguous axial images were obtained from the base of the skull  through the vertex without intravenous contrast. COMPARISON:  None. FINDINGS: Brain: There is no mass, hemorrhage or extra-axial collection. The size and configuration of the ventricles and extra-axial CSF spaces are normal. The brain parenchyma is normal, without evidence of acute or chronic infarction. Vascular: No abnormal hyperdensity of the major intracranial arteries or dural venous sinuses. No intracranial atherosclerosis. Skull: The visualized skull base, calvarium and extracranial soft tissues are normal. Sinuses/Orbits: No fluid levels or advanced mucosal thickening of the visualized paranasal sinuses. No mastoid or middle ear effusion. The orbits are normal. ASPECTS Surgery By [facility] Stroke Program Early CT Score) - Ganglionic level infarction (caudate, lentiform nuclei, internal capsule, insula, M1-M3 cortex): 7 - Supraganglionic infarction (M4-M6 cortex): 3 Total score (0-10 with 10 being normal): 10 IMPRESSION: 1. Normal head CT. 2. ASPECTS is 10. 3. These results were communicated to Dr. Donnetta Simpers at 1:45 am on 10/14/2020 by telephone. Electronically Signed   By: Ulyses Jarred M.D.   On: 10/14/2020 01:46

## 2020-10-15 NOTE — Progress Notes (Addendum)
STROKE TEAM PROGRESS NOTE   HISTORY OF PRESENT ILLNESS (per record) Raymond Hurley. is a 87 y.o. male with PMH significant for history of Melanoma ([place]), Vitamin B 12 deficiency, and Afib on Eliquis qand compliant who presents with an episode of R facial droop, slurred speech and not following commands. LKW per EMS was 2300 on 10/13/20. Wife called EMS after she found him. In the ED, he is improving and had no focal deficit. Of note, vitals with blood pressure of 83/50. Per wife Raymond Hurley, Raymond Hurley has Covid. He has diarrhea, has been spending a lot of time in the bathroom, unable to tolerate PO intake. He went to take a shower, he got tired and was breathing heavily. It was uncomfortable for him to lay down. He was sitting in the chair and wife was checking on him. Wife checked him on it at [place] on 10/15/19 and he was spaced out, was rambling, and he seemed to have difficulty pulling the words out. Was not coherent and did not make sense. Wife noted that he had a R facial droop. He has not been eating and drinking a lot over the last couple of days. NIHSS on presentation to the ED was 0. NIHSS: 0 MRS: 0 TPA: not offered due to resolution of symptoms. Thrombectomy: not offered due to resolution of symptoms.   INTERVAL HISTORY He is sitting up in bed.  He has no complaints MRI scan of the brain is negative for acute stroke.  Patient still states he is having some visual hallucinations and the nurse can.  MRI of the brain shows no large vessel stenosis..  Cholesterol 57 mg percent hemoglobin A1c 5.7.  Echocardiogram is pending.  Patient states that he had missed a few doses of Eliquis prior to admission.  OBJECTIVE Vitals:   10/15/20 0100 10/15/20 0130 10/15/20 0300 10/15/20 0400  BP: (!) 87/67  (!) 106/57 107/63  Pulse: 62 65 73 (!) 53  Resp: 17 (!) 21 (!) 31 (!) 29  Temp:      TempSrc:      SpO2: 93% 97% 96% 95%  Weight:      Height:        CBC:  Recent Labs  Lab 10/14/20 0123  10/14/20 0129 10/15/20 0445  WBC 30.6*  --  42.7*  NEUTROABS 19.2*  --  30.5*  HGB 12.5* 13.6 11.2*  HCT 36.9* 40.0 34.3*  MCV 107.9*  --  107.9*  PLT 162  --  270    Basic Metabolic Panel:  Recent Labs  Lab 10/14/20 0909 10/15/20 0445  NA 129* 131*  K 3.4* 3.4*  CL 97* 101  CO2 19* 13*  GLUCOSE 103* 107*  BUN 56* 72*  CREATININE 5.08* 6.34*  CALCIUM 4.5* 5.1*  MG 1.2* 2.2  PHOS  --  6.1*    Lipid Panel:     Component Value Date/Time   CHOL 117 10/14/2020 0909   TRIG 82 10/14/2020 0909   HDL 44 10/14/2020 0909   CHOLHDL 2.7 10/14/2020 0909   VLDL 16 10/14/2020 0909   LDLCALC 57 10/14/2020 0909   HgbA1c:  Lab Results  Component Value Date   HGBA1C 5.7 (H) 10/14/2020   Urine Drug Screen: No results found for: LABOPIA, COCAINSCRNUR, LABBENZ, AMPHETMU, THCU, LABBARB  Alcohol Level No results found for: [place]  DG Chest Port 1 View 10/14/2020 IMPRESSION:  Patchy left lung base opacity, suggesting aspiration or pneumonia.   CT HEAD CODE STROKE WO CONTRAST  10/14/2020 IMPRESSION:  1. Normal head CT.  2. ASPECTS is 10.   MRI BRAIN WO CONTRAST/MRA HEAD WO CONTRAST MRI HEAD WITHOUT CONTRAST  MRA HEAD WITHOUT CONTRAST  TECHNIQUE: Multiplanar, multiecho pulse sequences of the brain and surrounding structures were obtained without intravenous contrast. Angiographic images of the head were obtained using MRA technique without contrast.  COMPARISON:  CT head 10/14/2020  FINDINGS: MRI HEAD FINDINGS  Brain: Negative for acute infarct. Small area of T2 shine through in the left frontal white matter. No areas of restricted diffusion.  Mild white matter changes with scattered small periventricular deep white matter hyperintensities bilaterally. Brainstem normal. Negative for hemorrhage or mass. Negative for hydrocephalus. Cerebral volume normal.  Vascular: Normal arterial flow voids  Skull and upper cervical spine: No focal skeletal  abnormality.  Sinuses/Orbits: Mild mucosal edema paranasal sinuses and right mastoid sinus. Normal orbit  Other: None  MRA HEAD FINDINGS  Both vertebral arteries are widely patent and normal. Basilar widely patent. AICA, PICA widely patent. Superior cerebellar and posterior cerebral arteries normal bilaterally.  Internal carotid artery widely patent bilaterally. Anterior and middle cerebral arteries normal bilaterally.  Negative for cerebral aneurysm.  IMPRESSION: 1. Negative for acute infarct. Mild chronic microvascular ischemic change in the white matter. 2. Negative MRA head  Transthoracic Echocardiogram - pending  Bilateral Carotid Dopplers - pending  ECG - atrial fibrillation - ventricular response 97 BPM (See cardiology reading for complete details)  PHYSICAL EXAM Blood pressure 107/63, pulse (!) 53, temperature 98.4 F (36.9 C), temperature source Rectal, resp. rate (!) 29, height 5\' 8"  (1.727 m), weight 95.1 kg, SpO2 95 %. Pleasant elderly Caucasian male not in distress.  Slightly hard of hearing. . Afebrile. Head is nontraumatic. Neck is supple without bruit.    Cardiac exam no murmur or gallop. Lungs are clear to auscultation. Distal pulses are well felt.  Neurological Exam ;  Awake  Alert oriented x 3. Normal speech and language.eye movements full without nystagmus.fundi were not visualized. Vision acuity and fields appear normal. Hearing is normal. Palatal movements are normal. Face symmetric. Tongue midline. Normal strength, tone, reflexes and coordination. Normal sensation. Gait deferred.   ASSESSMENT/PLAN Mr. Raymond Hurley. is a 87 y.o. male with history of Melanoma ([place]) ([place]), Vitamin B 12 deficiency, Afib on Eliquis, hx of VT, dilated cardiomyopathy, and recent poor PO intake who presents with an episode of AMS, R facial droop, slurred speech, word finding difficulties and BP 83/50 (fluids) in ED. He did not receive IV t-PA  due to resolution of deficits.  TIA: - embolic - likely from atrial fibrillation due to recent noncompliance with Eliquis.  Versus Covid hypercoagulability  Code Stroke CT Head - Normal head CT. ASPECTS is 10.   CT head - not ordered  MRI head - No acute stroke  MRA head - No LVO or significant flow limiting stenoses.  CTA H&N - not ordered  CT Perfusion - not ordered  Carotid Doppler - pending  2D Echo - pending  Sars Corona Virus 2 - positive   LDL - 57  HgbA1c - 5.7  UDS - not ordered  VTE prophylaxis - SCDs / Eliquis Diet  Diet Order            Diet Heart Room service appropriate? Yes; Fluid consistency: Thin  Diet effective now                 Eliquis 5 mg Bid prior to admission, now on  Eliquis 5 mg Bid  Patient will be counseled to be compliant with his antithrombotic medications  Ongoing aggressive stroke risk factor management  Therapy recommendations:  pending  Disposition:  Pending  Hypertension  Home BP meds: Toprol XL 25 mg daily  Current BP meds: none   Hypotensive . Permissive hypertension (avoid extreme htn with CM hx) but gradually normalize in 5-7 days  . Long-term BP goal normotensive  Hyperlipidemia  Home Lipid lowering medication: none   LDL 57, goal < 70  Current lipid lowering medication: none - hold off statin with LDL < 70 and mildly elevated LFTs  Continue statin at discharge  Other Stroke Risk Factors  Advanced age  ETOH use, advised to drink no more than 1 alcoholic beverage per day.  Obesity, Body mass index is 31.88 kg/m., recommend weight loss, diet and exercise as appropriate   Family hx stroke - not on file  Atrial fibrillation on Eliquis   CM / CHF hx  Other Active Problems, Findings and Recommendations  Code status - not documented  NPO  Paroxysmal atrial fibrillation (Elquis)  Dilated cardiomyopathy / CHF (EF 20% -> 45%) (Entresto)  Hx of VT (per Oncology)  Abnormal CXR - aspiration vs  pneumonia  Na 130  Acute kidney failure - Creatinine - 5.60 ;  GFR - 9 (recent diarrhea and poor PO intake)  Leukocytosis - WBC's - 30.6 (afebrile)  Hypocalcemia  AST - 86  ;  ALT - [facility] day # 1 Plan continue anticoagulation for A. fib and if patient is likely to need dialysis catheter for his renal failure he may be better off starting IV heparin for now and Eliquis later.  Continue ongoing TIA work-up.  Mobilize out of bed.  Therapy consults.  Long discussion with patient and Dr. Candiss Norse and answered questions about his care.  Greater than 50% time during this 35-minute visit was spent on counseling and coordination of care about his TIA and discussion about anticoagulation need and answering questions  Antony Contras, MD  ADDENDUM ;echo and carotid dopplers both unremarkable. D/w Dr Candiss Norse 10/17/19. Stroke team will sign off. Call for questions  Antony Contras, MD  To contact Stroke Continuity provider, please refer to [email or web address] After hours, contact General Neurology

## 2020-10-15 NOTE — Progress Notes (Signed)
Cross-coverage note:   Patient seen on f/u rounds while holding in ED for progressive bed. No acute issues. Discussed with RN.

## 2020-10-15 NOTE — ED Notes (Signed)
Unsuccessful attempt to give report 

## 2020-10-15 NOTE — Progress Notes (Signed)
[place] for Heparin Indication: atrial fibrillation  Allergies  Allergen Reactions  . Povidone-Iodine Swelling  . Tape Swelling  . Bee Venom Rash  . Covid-19 Mrna Vacc (Moderna) Hives and Rash    Rash came after booster shot. Pt was fine with 1st and 2nd    Patient Measurements: Height: 5\' 8"  (172.7 cm) Weight: 95.1 kg (209 lb 10.5 oz) IBW/kg (Calculated) : 68.4 Heparin Dosing Weight: 88.5 kg  Vital Signs: BP: 116/64 (01/02 1240) Pulse Rate: 110 (01/02 1240)  Labs: Recent Labs    10/14/20 0123 10/14/20 0129 10/14/20 0144 10/14/20 0344 10/14/20 0909 10/15/20 0445  HGB 12.5* 13.6  --   --   --  11.2*  HCT 36.9* 40.0  --   --   --  34.3*  PLT 162  --   --   --   --  157  APTT 40*  --   --   --   --   --   LABPROT 16.5*  --   --   --   --   --   INR 1.4*  --   --   --   --   --   CREATININE 5.89* 5.60*  --   --  5.08* 6.34*  TROPONINIHS  --   --  50* 61*  --   --     Estimated Creatinine Clearance: 9.2 mL/min (A) (by C-G formula based on SCr of 6.34 mg/dL (H)).   Medical History: Past Medical History:  Diagnosis Date  . Atrial fibrillation ([place])   . Melanoma ([place])   . Vitamin B 12 deficiency     Medications:  Scheduled:  . albuterol  2 puff Inhalation Q6H  . vitamin C  500 mg Oral Daily  . bisacodyl  10 mg Rectal Daily  . docusate sodium  200 mg Oral BID  . methylPREDNISolone (SOLU-MEDROL) injection  60 mg Intravenous Q12H  . pantoprazole  40 mg Oral Daily  . Pedialyte  1,000 mL Oral Q4H  . polyethylene glycol  17 g Oral BID  . sodium chloride flush  3 mL Intravenous Q12H  . zinc sulfate  220 mg Oral Daily    Assessment: Patient is a 75 yom that is being admitted for COVID and AKI. Patient takes Apixaban pta for aflutter. Pharmacy has been asked to dose heparin at this time due to concerns for taking oral meds.   Last dose of Apixaban was at 1030 on 10/15/20 Goal of Therapy:  aPTT 66-102 seconds Monitor  platelets by anticoagulation protocol: Yes   Plan:  - No Heparin bolus as on Apixaban last dose was at 1030 on 10/15/20 - Start heparin drip @ 1300 units/hr on 10/15/20 at 2200  - Will monitor heparin using aPTT until aPTT and Heparin levels correlate - aPTT level in ~ 8 hours  - Monitor patient for s/s of bleeding and CBC while on heparin   Duanne Limerick PharmD. BCPS  10/15/2020,1:58 PM

## 2020-10-15 NOTE — Progress Notes (Signed)
Pharmacy Antibiotic Note  Raymond Hurley. is a 87 y.o. male admitted on 10/14/2020 with pneumonia.  Pharmacy has been consulted for Unasyn dosing.  Height: 5\' 8"  (172.7 cm) Weight: 95.1 kg (209 lb 10.5 oz) IBW/kg (Calculated) : 68.4  No data recorded.  Recent Labs  Lab 10/14/20 0123 10/14/20 0129 10/14/20 0144 10/14/20 0412 10/14/20 0909 10/15/20 0445  WBC 30.6*  --   --   --   --  42.7*  CREATININE 5.89* 5.60*  --   --  5.08* 6.34*  LATICACIDVEN  --   --  2.5* 1.8  --   --     Estimated Creatinine Clearance: 9.2 mL/min (A) (by C-G formula based on SCr of 6.34 mg/dL (H)).    Allergies  Allergen Reactions  . Povidone-Iodine Swelling  . Tape Swelling  . Bee Venom Rash  . Covid-19 Mrna Vacc (Moderna) Hives and Rash    Rash came after booster shot. Pt was fine with 1st and 2nd    Antimicrobials this admission: 1/1 Ceftriaxone >> x1 1/2 Unasun >>   Dose adjustments this admission:   Microbiology results:  Plan:  - Start Unasyn 3g IV q24h   - Monitor renal function and urine output   Thank you for allowing pharmacy to be a part of this patient's care.  Duanne Limerick PharmD. BCPS  10/15/2020 9:53 AM

## 2020-10-15 NOTE — ED Notes (Signed)
Pt laying in bed c/o abdominal pain states it feels like he is now constipated and very bloated. Pt has shallow irregular breathing states it hurts to breath normally.  Remains on 3L nasal cannula Skin is warm, dry and intact Call light within reach and pt updated on plan of care

## 2020-10-15 NOTE — Procedures (Signed)
Heart rate is too high for accurate echo at this time. 

## 2020-10-15 NOTE — ED Notes (Signed)
Pt transferred back to bed from recliner with minimal assist.

## 2020-10-16 ENCOUNTER — Inpatient Hospital Stay (HOSPITAL_COMMUNITY): Payer: Medicare Other

## 2020-10-16 DIAGNOSIS — R778 Other specified abnormalities of plasma proteins: Secondary | ICD-10-CM

## 2020-10-16 DIAGNOSIS — G459 Transient cerebral ischemic attack, unspecified: Secondary | ICD-10-CM

## 2020-10-16 DIAGNOSIS — U071 COVID-19: Secondary | ICD-10-CM | POA: Diagnosis not present

## 2020-10-16 DIAGNOSIS — J1282 Pneumonia due to coronavirus disease 2019: Secondary | ICD-10-CM | POA: Diagnosis not present

## 2020-10-16 DIAGNOSIS — R6520 Severe sepsis without septic shock: Secondary | ICD-10-CM | POA: Diagnosis not present

## 2020-10-16 DIAGNOSIS — N179 Acute kidney failure, unspecified: Secondary | ICD-10-CM | POA: Diagnosis not present

## 2020-10-16 DIAGNOSIS — I5032 Chronic diastolic (congestive) heart failure: Secondary | ICD-10-CM

## 2020-10-16 DIAGNOSIS — A419 Sepsis, unspecified organism: Secondary | ICD-10-CM | POA: Diagnosis not present

## 2020-10-16 DIAGNOSIS — I480 Paroxysmal atrial fibrillation: Secondary | ICD-10-CM | POA: Diagnosis not present

## 2020-10-16 DIAGNOSIS — I5022 Chronic systolic (congestive) heart failure: Secondary | ICD-10-CM | POA: Diagnosis not present

## 2020-10-16 LAB — COMPREHENSIVE METABOLIC PANEL
ALT: 43 U/L (ref 0–44)
AST: 54 U/L — ABNORMAL HIGH (ref 15–41)
Albumin: 2.5 g/dL — ABNORMAL LOW (ref 3.5–5.0)
Alkaline Phosphatase: 66 U/L (ref 38–126)
Anion gap: 22 — ABNORMAL HIGH (ref 5–15)
BUN: 98 mg/dL — ABNORMAL HIGH (ref 8–23)
CO2: 10 mmol/L — ABNORMAL LOW (ref 22–32)
Calcium: 5.3 mg/dL — CL (ref 8.9–10.3)
Chloride: 98 mmol/L (ref 98–111)
Creatinine, Ser: 7.98 mg/dL — ABNORMAL HIGH (ref 0.61–1.24)
GFR, Estimated: 6 mL/min — ABNORMAL LOW (ref >60)
Glucose, Bld: 111 mg/dL — ABNORMAL HIGH (ref 70–99)
Potassium: 4 mmol/L (ref 3.5–5.1)
Sodium: 130 mmol/L — ABNORMAL LOW (ref 135–145)
Total Bilirubin: 0.5 mg/dL (ref 0.3–1.2)
Total Protein: 5.5 g/dL — ABNORMAL LOW (ref 6.5–8.1)

## 2020-10-16 LAB — CBC WITH DIFFERENTIAL/PLATELET
Abs Immature Granulocytes: 3.11 10*3/uL — ABNORMAL HIGH (ref 0.00–0.07)
Basophils Absolute: 0 10*3/uL (ref 0.0–0.1)
Basophils Relative: 0 %
Eosinophils Absolute: 0.1 10*3/uL (ref 0.0–0.5)
Eosinophils Relative: 0 %
HCT: 31.9 % — ABNORMAL LOW (ref 39.0–52.0)
Hemoglobin: 10.9 g/dL — ABNORMAL LOW (ref 13.0–17.0)
Immature Granulocytes: 6 %
Lymphocytes Relative: 1 %
Lymphs Abs: 0.7 10*3/uL (ref 0.7–4.0)
MCH: 36 pg — ABNORMAL HIGH (ref 26.0–34.0)
MCHC: 34.2 g/dL (ref 30.0–36.0)
MCV: 105.3 fL — ABNORMAL HIGH (ref 80.0–100.0)
Monocytes Absolute: 7.8 10*3/uL — ABNORMAL HIGH (ref 0.1–1.0)
Monocytes Relative: 15 %
Neutro Abs: 39.1 10*3/uL — ABNORMAL HIGH (ref 1.7–7.7)
Neutrophils Relative %: 78 %
Platelets: 170 10*3/uL (ref 150–400)
RBC: 3.03 MIL/uL — ABNORMAL LOW (ref 4.22–5.81)
RDW: 14.9 % (ref 11.5–15.5)
WBC Morphology: INCREASED
WBC: 50.7 10*3/uL (ref 4.0–10.5)
nRBC: 0 % (ref 0.0–0.2)

## 2020-10-16 LAB — APTT
aPTT: >200 seconds (ref 24–36)
aPTT: 72 seconds — ABNORMAL HIGH (ref 24–36)

## 2020-10-16 LAB — LIPASE, BLOOD: Lipase: 38 U/L (ref 11–51)

## 2020-10-16 LAB — HEPARIN LEVEL (UNFRACTIONATED): Heparin Unfractionated: 2.16 IU/mL — ABNORMAL HIGH (ref 0.30–0.70)

## 2020-10-16 LAB — ECHOCARDIOGRAM LIMITED
Area-P 1/2: 3.42 cm2
Height: 68 in
S' Lateral: 3.6 cm
Weight: 3354.52 oz

## 2020-10-16 LAB — BRAIN NATRIURETIC PEPTIDE: B Natriuretic Peptide: 811.3 pg/mL — ABNORMAL HIGH (ref 0.0–100.0)

## 2020-10-16 LAB — C-REACTIVE PROTEIN: CRP: 32.8 mg/dL — ABNORMAL HIGH (ref <1.0)

## 2020-10-16 LAB — PATHOLOGIST SMEAR REVIEW

## 2020-10-16 LAB — PROCALCITONIN: Procalcitonin: 4.97 ng/mL

## 2020-10-16 LAB — MAGNESIUM: Magnesium: 2.2 mg/dL (ref 1.7–2.4)

## 2020-10-16 LAB — D-DIMER, QUANTITATIVE: D-Dimer, Quant: 10.7 ug/mL-FEU — ABNORMAL HIGH (ref 0.00–0.50)

## 2020-10-16 MED ORDER — SODIUM CHLORIDE 0.9 % IV SOLN: INTRAVENOUS | Status: DC

## 2020-10-16 MED ORDER — HEPARIN (PORCINE) 25000 UT/250ML-% IV SOLN
1000.0000 [IU]/h | INTRAVENOUS | Status: DC
  Administered 2020-10-16: 1000 [IU]/h via INTRAVENOUS
  Filled 2020-10-16: qty 250

## 2020-10-16 MED ORDER — SODIUM CHLORIDE 0.9 % IV SOLN
4.0000 g | Freq: Once | INTRAVENOUS | Status: AC
  Administered 2020-10-16: 4 g via INTRAVENOUS
  Filled 2020-10-16: qty 40

## 2020-10-16 MED ORDER — HALOPERIDOL LACTATE 5 MG/ML IJ SOLN
1.0000 mg | Freq: Four times a day (QID) | INTRAMUSCULAR | Status: DC | PRN
  Administered 2020-10-16 (×3): 1 mg via INTRAVENOUS
  Filled 2020-10-16 (×3): qty 1

## 2020-10-16 MED ORDER — METOPROLOL TARTRATE 12.5 MG HALF TABLET
12.5000 mg | ORAL_TABLET | Freq: Three times a day (TID) | ORAL | Status: DC
  Administered 2020-10-16: 12.5 mg via ORAL
  Filled 2020-10-16: qty 1

## 2020-10-16 MED ORDER — HALOPERIDOL LACTATE 5 MG/ML IJ SOLN
2.0000 mg | Freq: Four times a day (QID) | INTRAMUSCULAR | Status: DC | PRN
  Administered 2020-10-16 – 2020-10-17 (×2): 2 mg via INTRAVENOUS
  Filled 2020-10-16 (×2): qty 1

## 2020-10-16 MED ORDER — CALCIUM GLUCONATE-NACL 2-0.675 GM/100ML-% IV SOLN
2.0000 g | Freq: Once | INTRAVENOUS | Status: DC
  Filled 2020-10-16: qty 100

## 2020-10-16 MED ORDER — QUETIAPINE FUMARATE 25 MG PO TABS
25.0000 mg | ORAL_TABLET | Freq: Two times a day (BID) | ORAL | Status: DC
  Administered 2020-10-16 – 2020-10-18 (×3): 25 mg via ORAL
  Filled 2020-10-16 (×3): qty 1

## 2020-10-16 MED ORDER — SODIUM BICARBONATE 8.4 % IV SOLN
INTRAVENOUS | Status: DC
  Filled 2020-10-16 (×2): qty 850

## 2020-10-16 NOTE — Progress Notes (Addendum)
NAME:  Tyreik Delahoussaye., MRN:  [id number], DOB:  07-31-1931, LOS: 2 ADMISSION DATE:  10/14/2020, CONSULTATION DATE:  10/14/2020 REFERRING MD:  [facility] - [facility], CHIEF COMPLAINT:  Hypotension.    HPI/course in hospital   87 year old man who is hypotensive in the context of acute diarrhea on 1/1 was admitted w/ dx of COVID and acute dehydration from acute diarrheal illness.  Of note he was fully vaccinated and had received his booster.  Critical care was consulted given hypotension however this responded to IV hydration and supportive care. Hospital course from 1/1 through 1/3: Subsequently found to have aspiration pneumonia,  And, worsening delirium, and multiple organ failure, on 1/3  had worsening work of breathing more confusion, and CT of abdomen showed asymptomatic pancreatitis.  Given advanced age and multiple organ dysfunction in addition to concern about further clinical decline critical care asked to reevaluate   Past Medical History  Atrial fibrillation, melanoma, vitamin B12 deficiency.  Systolic cardiomyopathy.    Interim history/subjective:    Objective   Blood pressure (Abnormal) 126/52, pulse (Abnormal) 111, temperature 98.4 F (36.9 C), temperature source Oral, resp. rate 20, height 5\' 8"  (1.727 m), weight 95.1 kg, SpO2 95 %.        Intake/Output Summary (Last 24 hours) at 10/16/2020 1423 Last data filed at 10/16/2020 0700 Gross per 24 hour  Intake 364.25 ml  Output 50 ml  Net 314.25 ml   Filed Weights   10/14/20 0153  Weight: 95.1 kg    Examination: General: 87 year old white male currently lying in bed no acute distress HEENT normocephalic atraumatic no jugular venous distention mucous membranes moist Pulmonary: Crackles bases, some scattered rhonchi, currently 3 L via nasal cannula able to speak 3-4 word phrases Cardiac: Regular irregular with atrial fibrillation Abdomen soft nontender Extremities warm dry GU clear yellow via Foley catheter Neuro awake  oriented but does perseverate, does get confused, no focal deficits appreciated  Resolved problems   Hypovolemic shock Acute diarrheal illness  Assessment & Plan:   Acute renal failure with MIXED Picture of both anion and non-anion gap acidosis  hyponatremia, suspect all secondary to recent diarrheal illness and volume depletion -Has been seen by nephrology on 1/2 recommending supportive care, IV hydration and holding Entresto -Renal failure continues to deteriorate, but making urine, no immediate need for dialysis however His acid-base meet necessitate this Anion gap 22-12/24-10 Plan Continue IV hydration; will speak to nephrology but may be reasonable to change IVF to bicarb. This might also help w/ his WOB Renal dose medications Serial chemistries Additional recommendation per nephrology, the patient and his wife both indicate they would be open to short-term dialysis although I do not think he is a long-term candidate I do think a trial of this might be reasonable to see if we can turn this around if his acidosis worsens, he becomes more uremic, or has significant electrolyte imbalance or worsening hypoxia  Acute hypoxic respiratory failure secondary to aspiration pneumonia Portable chest x-ray with bibasilar airspace disease, on further evaluation with CT abdomen left basilar airspace disease, right basilar atelectasis, and bilateral effusions Plan Continue supplemental oxygen Continue pulse oximetry Aspiration precautions Day #2 Unasyn and doxycycline, probably does not need the Doxy Repeat chest x-ray a.m.  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  Disposition: progressive Code status: full code   Erick Colace ACNP-BC [place] Pager # [phone] OR # [phone] if no answer   Pulmonary critical care attending:  87 yo M, COVID19, acute diarrhea, dehydrated with pre-renal AKI.   BP (!) 126/52 (BP Location: Right Arm)   Pulse (!) 111   Temp 98.4 F (36.9 C) (Oral)   Resp 20   Ht 5\' 8"  (1.727 m)   Wt 95.1 kg   SpO2 95%   BMI 31.88 kg/m  Gen: elderly male, resting in bed, mildly labored breathing  HENT: NCAT tracking  Heart: RRR s1 s2  Lungs: BL vented breaths  Abd: soft, nt nd   Labs: reviewed   A:  Acute renal failure, however is making some urine  AHRF 2/2 [id number]  Chronic systolic heart failure  Acute metabolic encephalopathy secondary to above  Pancreatitis, elevated inflammatory markers Leukocytosis    P: Agree with continued monitoring on the floor  Follow UOP closely We discussed GOC and patient would be open to dialysis if needed  Continue steroids and abx   Garner Nash, DO [facility] 10/16/2020 4:23 PM

## 2020-10-16 NOTE — Evaluation (Signed)
Occupational Therapy Evaluation Patient Details Name: Raymond Hurley. MRN: [id number] DOB: November 28, 1930 Today's Date: 10/16/2020    History of Present Illness Pt is a 87 y.o. male with past medical history for metastatic melanoma with pancreatic head mass, A fib (on Eliquis), CHF (EF 45% as of 08/2019), currently hospitalized for COVID-PNA and possible aspiration PNA presenting for consultation of pancreatitis.   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 Pt currently Pt limited by decreased strength, decreased ability to care for self, and decreased activity tolerance. Pt with O2 sats dropping to 80s on RA when talking at EOB and with minimal exertion, pt requiring 3L O2 >90%. Pt minA overall for ADL and mobility. Pt requiring cues for safety awareness and proper hand placement. Pt SOB with exertion, but quickly recovers with O2 on. Pt would benefit from continued OT skilled services for ADL, mobility and safety in [place] setting. OT following acutely.      Follow Up Recommendations  Home health OT;Supervision/Assistance - 24 hour    Equipment Recommendations  None recommended by OT    Recommendations for Other Services       Precautions / Restrictions Precautions Precautions: Fall;Other (comment) Precaution Comments: covid Restrictions Weight Bearing Restrictions: No      Mobility Bed Mobility Overal bed mobility: Needs Assistance Bed Mobility: Sit to Supine;Supine to Sit     Supine to sit: Min assist Sit to supine: Min guard   General bed mobility comments: minA for trunk elevation; pt not willing to use bed rail with RUE, wanting therapist hand to elevate trunk    Transfers Overall transfer level: Needs assistance Equipment used: 2 person hand held assist Transfers: Sit to/from Stand Sit to Stand: Min assist;+2 physical assistance;+2 safety/equipment         General transfer comment: MinA +2 for  stability; pt wanting to pull up with OT/PT rather that push from bed.    Balance Overall balance assessment: Needs assistance Sitting-balance support: No upper extremity supported;Bilateral upper extremity supported;Feet supported Sitting balance-Leahy Scale: Poor Sitting balance - Comments: pt requiring BUEs for support due to SOB     Standing balance-Leahy Scale: Poor Standing balance comment: requiring assist for balance                           ADL either performed or assessed with clinical judgement   ADL Overall ADL's : Needs assistance/impaired Eating/Feeding: Set up;Sitting   Grooming: Set up;Sitting;Cueing for safety   Upper Body Bathing: Minimal assistance;Sitting   Lower Body Bathing: Moderate assistance;Sitting/lateral leans;Sit to/from stand   Upper Body Dressing : Minimal assistance;Sitting   Lower Body Dressing: Moderate assistance;Cueing for safety;Sitting/lateral leans   Toilet Transfer: Minimal assistance;Stand-pivot   Toileting- Clothing Manipulation and Hygiene: Minimal assistance;Sit to/from stand;Sitting/lateral lean;Cueing for safety       Functional mobility during ADLs: Minimal assistance;Cueing for safety General ADL Comments: Pt limited by decreased strength, decreased ability to care for self, and decreased activity tolerance. Pt with O2 sats dropping to 80s with talking at EOB and with minimal exertion, pt requiring 3L O2.     Vision Baseline Vision/History: Wears glasses Wears Glasses: At all times Patient Visual Report: No change from baseline Vision Assessment?: No apparent visual deficits     Perception     Praxis      Pertinent Vitals/Pain Pain Assessment: No/denies pain Faces Pain Scale: No hurt Pain  Intervention(s): Monitored during session     Hand Dominance Right   Extremity/Trunk Assessment Upper Extremity Assessment Upper Extremity Assessment: Generalized weakness   Lower Extremity Assessment Lower  Extremity Assessment: Generalized weakness   Cervical / Trunk Assessment Cervical / Trunk Assessment: Kyphotic   Communication Communication Communication: HOH   Cognition Arousal/Alertness: Awake/alert Behavior During Therapy: Anxious Overall Cognitive Status: Impaired/Different from baseline Area of Impairment: Awareness;Problem solving;Memory;Safety/judgement                     Memory: Decreased short-term memory   Safety/Judgement: Decreased awareness of deficits Awareness: Emergent Problem Solving: Slow processing;Difficulty sequencing;Requires verbal cues General Comments: A/O x4; pt remains anxious throughout session; pt not always following commands for hand placement   General Comments  SOB noted; O2 on RA 95% in bed; 3L O2 Sats >88% on 3L with activity, At rest on RA  >96%.  HR 90's at rest, consistently 120's and up max 140's    Exercises     Shoulder Instructions      Home Living Family/patient expects to be discharged to:: Private residence Living Arrangements: Spouse/significant other Available Help at Discharge: Family;Available 24 hours/day Type of Home: House Home Access: Level entry (at front)     Home Layout: One level;Other (Comment);Laundry or work area in basement     [employer] Shower/Tub: Teacher, early years/pre: [place]: Shower seat;Grab bars - tub/shower   Additional Comments: spouse is healthy; son works for family business      Prior Functioning/Environment Level of Independence: Independent with assistive device(s);Independent        Comments: driving; work full-time; Careers adviser; no AD        OT Problem List: Decreased strength;Decreased activity tolerance;Impaired balance (sitting and/or standing);Decreased safety awareness;Increased edema;Cardiopulmonary status limiting activity;Decreased cognition      OT Treatment/Interventions: Self-care/ADL training;Therapeutic  exercise;Energy conservation;DME and/or AE instruction;Therapeutic activities;Cognitive remediation/compensation;Patient/family education;Balance training    OT Goals(Current goals can be found in the care plan section) Acute Rehab OT Goals Patient Stated Goal: to go home OT Goal Formulation: With patient Time For Goal Achievement: 10/30/20 Potential to Achieve Goals: Good ADL Goals Pt Will Perform Lower Body Dressing: with min guard assist Pt Will Transfer to Toilet: with min guard assist;bedside commode Pt/caregiver will Perform Home Exercise Program: Increased strength;Both right and left upper extremity;With Supervision Additional ADL Goal #1: Pt will tolerating x5 mins of OOB ADL with supervisionA and minimal verbal cues for proper technique. Additional ADL Goal #2: Pt will state/utilize 3 energy conservation techniques to increase ability to care for self and perform ADL functional mobility.  OT Frequency: Min 2X/week   Barriers to D/C:            Co-evaluation              AM-PAC OT "6 Clicks" Daily Activity     Outcome Measure Help from another person eating meals?: None Help from another person taking care of personal grooming?: A Little Help from another person toileting, which includes using toliet, bedpan, or urinal?: A Lot Help from another person bathing (including washing, rinsing, drying)?: A Lot Help from another person to put on and taking off regular upper body clothing?: A Little Help from another person to put on and taking off regular lower body clothing?: A Lot 6 Click Score: 16   End of Session Equipment Utilized During Treatment: Oxygen Nurse Communication: Mobility status  Activity Tolerance: Patient limited  by fatigue Patient left: in bed;with call bell/phone within reach;Other (comment) (with cardiologist inroom)  OT Visit Diagnosis: Unsteadiness on feet (R26.81);Muscle weakness (generalized) (M62.81)                Time: 9155-0271 OT Time  Calculation (min): 27 min Charges:  OT General Charges $OT Visit: 1 Visit OT Evaluation $OT Eval Moderate Complexity: 1 Mod  Jefferey Pica, OTR/L Acute Rehabilitation Services Pager: [phone] Office: [phone]   Nelvin Tomb C 10/16/2020, 5:02 PM

## 2020-10-16 NOTE — Progress Notes (Signed)
[facility] for Heparin Indication: atrial fibrillation  Allergies  Allergen Reactions  . Povidone-Iodine Swelling  . Tape Swelling  . Bee Venom Rash  . Covid-19 Mrna Vacc (Moderna) Hives and Rash    Rash came after booster shot. Pt was fine with 1st and 2nd    Patient Measurements: Height: 5\' 8"  (172.7 cm) Weight: 95.1 kg (209 lb 10.5 oz) IBW/kg (Calculated) : 68.4 Heparin Dosing Weight: 88.5 kg  Vital Signs: Temp: 98 F (36.7 C) (01/03 1942) Temp Source: Oral (01/03 1942) BP: 98/54 (01/03 1942) Pulse Rate: 114 (01/03 1942)  Labs: Recent Labs    10/14/20 0123 10/14/20 0129 10/14/20 0144 10/14/20 0344 10/14/20 0909 10/15/20 0445 10/16/20 0949 10/16/20 2000 10/16/20 2011  HGB 12.5* 13.6  --   --   --  11.2* 10.9*  --   --   HCT 36.9* 40.0  --   --   --  34.3* 31.9*  --   --   PLT 162  --   --   --   --  157 170  --   --   APTT 40*  --   --   --   --   --  >200*  --  72*  LABPROT 16.5*  --   --   --   --   --   --   --   --   INR 1.4*  --   --   --   --   --   --   --   --   HEPARINUNFRC  --   --   --   --   --   --   --  2.16*  --   CREATININE 5.89* 5.60*  --   --  5.08* 6.34* 7.98*  --   --   TROPONINIHS  --   --  50* 61*  --   --   --   --   --     Estimated Creatinine Clearance: 7.3 mL/min (A) (by C-G formula based on SCr of 7.98 mg/dL (H)).   Medical History: Past Medical History:  Diagnosis Date  . Atrial fibrillation ([place])   . Melanoma ([place])   . Vitamin B 12 deficiency     Medications:  Scheduled:  . albuterol  2 puff Inhalation Q6H  . vitamin C  500 mg Oral Daily  . Chlorhexidine Gluconate Cloth  6 each Topical Daily  . Gerhardt's butt cream   Topical TID  . methylPREDNISolone (SOLU-MEDROL) injection  60 mg Intravenous Q12H  . metoprolol tartrate  12.5 mg Oral TID  . pantoprazole  40 mg Oral Daily  . Pedialyte  1,000 mL Oral Q4H  . QUEtiapine  25 mg Oral BID  . sodium chloride flush  3 mL Intravenous Q12H   . zinc sulfate  220 mg Oral Daily    Assessment: Patient is a 87 yom that is being admitted for COVID and AKI. Patient takes Apixaban pta for aflutter. Pharmacy has been asked to dose heparin at this time due to concerns for taking oral meds.   Last dose of Apixaban was at 1030 on 10/15/20. Repeat aPTT this evening is now therapeutic at 72 seconds.  Goal of Therapy:  aPTT 66-102 seconds Monitor platelets by anticoagulation protocol: Yes   Plan:  -Continue heparin 1000 units/h -Daily aPTT and heparin level in am   Arrie Senate, PharmD, BCPS, [facility] Clinical Pharmacist [phone] Please check AMION for all [place]  numbers 10/16/2020

## 2020-10-16 NOTE — Consult Note (Signed)
Cardiology Consultation:   Patient ID: Raymond Hurley. MRN: [id number]; DOB: 05-21-1931  Admit date: 10/14/2020 Date of Consult: 10/16/2020  Primary Care Provider: Lajean Manes, [facility] Cardiologist: Kirk Ruths, MD  [facility] Electrophysiologist:  Jolyn Nap, MD  Due to the COVID-19 pandemic, this visit was completed with telemedicine (audio/video) technology to reduce patient and provider exposure as well as to preserve personal protective equipment.   Patient Profile:   Raymond Coven. is a 87 y.o. male with a history of minimal CAD on cath in 04/8937, chronic systolic CHF/non-ischemic cardiomyopathy with EF as low as 20-25% but improved to 45% on echo in 08/2019, paroxysmal atrial fibrillation on Eliquis, metastatic melanoma, and vitamin B12 deficiency who is being seen today for the evaluation of CHF at the request of Dr. Candiss Norse.  History of Present Illness:   Raymond Hurley is a 87 year old male with the above history who is followed by Dr. Stanford Breed. Patient initially seen by Cardiology in 03/2019 for evaluation of newly discovered reduced EF of 20-25%. Right/left cardiac catheterization showed only 20% stenosis of proximal to mid CX with well compensated hemodynamics. Patient was seen in [place] in 05/2019 and found to be in atrial flutter and was admitted. DCCV was recommended but patient left AMA.   He was seen by Dr. Stanford Breed for follow-up of this and outpatient TEE/DCCV was arranged. He underwent successful TEE/DCCV on 06/02/2019. EP referral was offered to discuss possible atrial flutter ablation but patient declined. Follow up echo in Nov 2020 showed improved LVEF 45%. Patient was last seen by Kerin Ransom, PA-C, in 06/2020 at which time he was doing well from a cardiac standpoint. On my preliminary review of echo today, LVEF is now normal 55%.  Patient presented to the ED on 10/14/2020 as a Code Stroke after being noted to have altered mental status and right  facial droop. Head CT was negative. EKG showed normal sinus rhythm with couplets and triplets of PVCs. BNP mildly elevated in the 130's. High-sensitivity troponin mildly elevated at 50 >> 61. D-dimer elevated at 5.74. Chest x-ray showed patchy left lung base opacity. WBC 30.6, Hgb 12.5, Plts 162. Na 130, K 3.8, Glucose 145, BUN 57, Cr 5.89 (baseline 1.1 to 1.4). Calcium 4.5. Albumin 2.8, AST 86, ALT 68, Alk Phos 56, Total Bili 0.9. Respiratory panel positive for COVID. Patient was admitted for further evaluation of TIA, AKI, sepsis, and acute hypoxic respiratory failure secondary to COVID pneumonitis and possible aspiration. Cardiology consulted for management of CHF at the family's request.  Has deteriorated further, creatinine now up to 8. WBC has increased to > 50k, bands present. D-dimer and BNP are increasing. 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 He remains in atrial fibrillation.     Past Medical History:  Diagnosis Date  . Atrial fibrillation ([place])   . Melanoma ([place])   . Vitamin B 12 deficiency     Past Surgical History:  Procedure Laterality Date  . CARDIOVERSION N/A 06/02/2019   Procedure: CARDIOVERSION;  Surgeon: Jerline Pain, MD;  Location: [facility] ENDOSCOPY;  Service: Cardiovascular;  Laterality: N/A;  . RIGHT/LEFT HEART CATH AND CORONARY ANGIOGRAPHY N/A 04/27/2019   Procedure: RIGHT/LEFT HEART CATH AND CORONARY ANGIOGRAPHY;  Surgeon: Jolaine Artist, MD;  Location: [facility] LAB;  Service: Cardiovascular;  Laterality: N/A;  . TEE WITHOUT CARDIOVERSION N/A 06/02/2019   Procedure: TRANSESOPHAGEAL ECHOCARDIOGRAM (TEE);  Surgeon: Marlou Porch,  Thana Farr, MD;  Location: [facility] ENDOSCOPY;  Service: Cardiovascular;  Laterality: N/A;     Home Medications:  Prior to Admission medications   Medication Sig Start Date End Date Taking? Authorizing Provider  acetaminophen (TYLENOL) 500 MG tablet Take  1,000 mg by mouth every 6 (six) hours as needed for mild pain or headache.   Yes [provider]  ELIQUIS 5 MG TABS tablet TAKE ONE TABLET TWICE DAILY Patient taking differently: Take 5 mg by mouth 2 (two) times daily. 08/17/20  Yes Lelon Perla, MD  metoprolol succinate (TOPROL-XL) 25 MG 24 hr tablet Take 1 tablet (25 mg total) by mouth every evening. 05/22/20  Yes Lelon Perla, MD  sacubitril-valsartan (ENTRESTO) 24-26 MG Take 1 tablet by mouth 2 (two) times daily. 12/07/19  Yes Lelon Perla, MD  triamcinolone cream (KENALOG) 0.1 % Apply 1 application topically 2 (two) times daily as needed (itching, rash). 04/25/20  Yes [provider]  montelukast (SINGULAIR) 10 MG tablet Take 1 tablet by mouth once daily as directed Patient not taking: No sig reported 06/13/20   Jiles Prows, MD    Inpatient Medications: Scheduled Meds: . albuterol  2 puff Inhalation Q6H  . vitamin C  500 mg Oral Daily  . Chlorhexidine Gluconate Cloth  6 each Topical Daily  . Gerhardt's butt cream   Topical TID  . methylPREDNISolone (SOLU-MEDROL) injection  60 mg Intravenous Q12H  . pantoprazole  40 mg Oral Daily  . Pedialyte  1,000 mL Oral Q4H  . QUEtiapine  25 mg Oral BID  . sodium chloride flush  3 mL Intravenous Q12H  . zinc sulfate  220 mg Oral Daily   Continuous Infusions: . sodium chloride 100 mL/hr at 10/15/20 0455  . ampicillin-sulbactam (UNASYN) IV 3 g (10/16/20 1011)  . calcium gluconate    . doxycycline (VIBRAMYCIN) IV 100 mg (10/16/20 1148)  . heparin     PRN Meds: acetaminophen, chlorpheniramine-HYDROcodone, guaiFENesin-dextromethorphan, haloperidol lactate, loperamide, melatonin, polyethylene glycol, triamcinolone  Allergies:    Allergies  Allergen Reactions  . Povidone-Iodine Swelling  . Tape Swelling  . Bee Venom Rash  . Covid-19 Mrna Vacc (Moderna) Hives and Rash    Rash came after booster shot. Pt was fine with 1st and 2nd    Social History:   Social  History   Socioeconomic History  . Marital status: Married    Spouse name: Not on file  . Number of children: Not on file  . Years of education: Not on file  . Highest education level: Not on file  Occupational History  . Not on file  Tobacco Use  . Smoking status: Never Smoker  . Smokeless tobacco: Never Used  Vaping Use  . Vaping Use: Never used  Substance and Sexual Activity  . Alcohol use: Yes    Alcohol/week: 7.0 standard drinks    Types: 7 Glasses of wine per week  . Drug use: Never  . Sexual activity: Not on file  Other Topics Concern  . Not on file  Social History Narrative  . Not on file   Social Determinants of Health   Financial Resource Strain: Not on file  Food Insecurity: Not on file  Transportation Needs: Not on file  Physical Activity: Not on file  Stress: Not on file  Social Connections: Not on file  Intimate Partner Violence: Not on file    Family History:   No history of premature onset cardiac illness/arrhythmia/sudden death  ROS:  Please see the  history of present illness.  All other ROS reviewed and negative.     Physical Exam/Data:   Vitals:   10/16/20 0012 10/16/20 0431 10/16/20 0750 10/16/20 1239  BP: (!) 110/54 (!) 110/52 111/85 (!) 126/52  Pulse: 99 (!) 105 97 (!) 111  Resp: '20 19 20 20  ' Temp: 98.2 F (36.8 C) 98 F (36.7 C) 98.1 F (36.7 C) 98.4 F (36.9 C)  TempSrc: Oral Oral Oral Oral  SpO2: 96% 97% 96% 95%  Weight:      Height:        Intake/Output Summary (Last 24 hours) at 10/16/2020 1246 Last data filed at 10/16/2020 0700 Gross per 24 hour  Intake 714.25 ml  Output 50 ml  Net 664.25 ml   Last 3 Weights 10/14/2020 07/05/2020 06/13/2020  Weight (lbs) 209 lb 10.5 oz 209 lb 9.6 oz 212 lb  Weight (kg) 95.1 kg 95.074 kg 96.163 kg     Body mass index is 31.88 kg/m.  VITAL SIGNS:  reviewed GEN:  no acute distress EYES:  sclerae anicteric, EOMI - Extraocular Movements Intact RESPIRATORY:  tachypneic CARDIOVASCULAR:  no  peripheral edema SKIN:  no rash, lesions or ulcers. MUSCULOSKELETAL:  no obvious deformities. NEURO:  disoriented PSYCH:  unable to assess  EKG:  The following EKGs were personally reviewed and demonstrate: - EKG on 10/14/2020: Normal sinus rhythm, rate 97 bpm, with PVC couplet/triplets, LAFB, and non-specific ST/T changes. - EKG on 10/15/2020: Atrial fibrillation, rate 131 bpm, with 4 beats of NSVT and bigeminy PVC.  Telemetry:  Telemetry was personally reviewed and demonstrates:  Atrial fibrillation with rates currently in the 100's to 110's. Has gotten as high as the 140's.  Relevant CV Studies: Right/Left Cardiac Catheterization 04/27/2019:  Prox Cx to Mid Cx lesion is 20% stenosed.   Findings: Ao = 97/68 (82) LV = 97/8 RA = 21/9 (14) RV = 19/5 PA = 21/9 (14) PCW = 8 Fick cardiac output/index = 5.0/2.4 PVR = 1.5 WU SVR = 1243 Ao sat = 98% PA sat = 72%, 72%  Assessment: 1. Minimal coronary artery disease 2. Severe NICM 25-30% 3. Well-compensated hemodynamics  Plan/Discussion: Continue medical therapy.  _______________  Echocardiogram 09/07/2019: Impressions: 1. Left ventricular ejection fraction, by visual estimation, is 45%. The  left ventricle has mildly decreased function. There is mildly increased  left ventricular hypertrophy.  2. The left ventricle demonstrates global hypokinesis.  3. Left atrial size was normal.  4. Right atrial size was normal.  5. The mitral valve is normal in structure. Trace mitral valve  regurgitation. No evidence of mitral stenosis.  6. The tricuspid valve is normal in structure. Tricuspid valve  regurgitation is trivial.  7. The aortic valve is tricuspid. Aortic valve regurgitation is not  visualized. No evidence of aortic valve sclerosis or stenosis.  8. There is mild dilatation of the ascending aorta measuring 38 mm.  9. Global right ventricle has normal systolic function.The right  ventricular size is normal. No increase  in right ventricular wall  thickness.  10. The inferior vena cava is normal in size with greater than 50%  respiratory variability, suggesting right atrial pressure of 3 mmHg.  11. The tricuspid regurgitant velocity is 2.12 m/s, and with an assumed  right atrial pressure of 3 mmHg, the estimated right ventricular systolic  pressure is normal at 21.0 mmHg.   Laboratory Data:  High Sensitivity Troponin:   Recent Labs  Lab 10/14/20 0144 10/14/20 0344  TROPONINIHS 50* 61*  Chemistry Recent Labs  Lab 10/14/20 0909 10/15/20 0445 10/16/20 0949  NA 129* 131* 130*  K 3.4* 3.4* 4.0  CL 97* 101 98  CO2 19* 13* 10*  GLUCOSE 103* 107* 111*  BUN 56* 72* 98*  CREATININE 5.08* 6.34* 7.98*  CALCIUM 4.5* 5.1* 5.3*  GFRNONAA 10* 8* 6*  ANIONGAP 13 17* 22*    Recent Labs  Lab 10/14/20 0123 10/15/20 0445 10/16/20 0949  PROT 5.8* 4.2* 5.5*  ALBUMIN 2.8* 2.1* 2.5*  AST 86* 65* 54*  ALT 68* 48* 43  ALKPHOS 56 53 66  BILITOT 0.9 0.6 0.5   Hematology Recent Labs  Lab 10/14/20 0123 10/14/20 0129 10/15/20 0445 10/16/20 0949  WBC 30.6*  --  42.7* 50.7*  RBC 3.42*  --  3.18* 3.03*  HGB 12.5* 13.6 11.2* 10.9*  HCT 36.9* 40.0 34.3* 31.9*  MCV 107.9*  --  107.9* 105.3*  MCH 36.5*  --  35.2* 36.0*  MCHC 33.9  --  32.7 34.2  RDW 14.3  --  14.3 14.9  PLT 162  --  157 170   BNP Recent Labs  Lab 10/14/20 0144 10/15/20 0445 10/16/20 0953  BNP 137.7* 238.6* 811.3*    DDimer  Recent Labs  Lab 10/14/20 0909 10/15/20 0445 10/16/20 0949  DDIMER 5.74* 7.77* 10.70*     Radiology/Studies:  DG Abd 1 View  Result Date: 10/15/2020 CLINICAL DATA:  Constipation. EXAM: ABDOMEN - 1 VIEW COMPARISON:  10/14/2020 FINDINGS: Mild gaseous distention of the colon is likely consistent with a component of ileus. No significant retained stool. No small bowel dilatation. IMPRESSION: Probable mild colonic ileus. Electronically Signed   By: Aletta Edouard M.D.   On: 10/15/2020 09:37   DG Abd 1  View  Result Date: 10/14/2020 CLINICAL DATA:  Diarrhea EXAM: ABDOMEN - 1 VIEW COMPARISON:  None. FINDINGS: The bowel gas pattern is normal. No radio-opaque calculi or other significant radiographic abnormality are seen. IMPRESSION: Negative. Electronically Signed   By: Rolm Baptise M.D.   On: 10/14/2020 13:02   MR ANGIO HEAD WO CONTRAST  Result Date: 10/14/2020 CLINICAL DATA:  TIA. EXAM: MRI HEAD WITHOUT CONTRAST MRA HEAD WITHOUT CONTRAST TECHNIQUE: Multiplanar, multiecho pulse sequences of the brain and surrounding structures were obtained without intravenous contrast. Angiographic images of the head were obtained using MRA technique without contrast. COMPARISON:  CT head 10/14/2020 FINDINGS: MRI HEAD FINDINGS Brain: Negative for acute infarct. Small area of T2 shine through in the left frontal white matter. No areas of restricted diffusion. Mild white matter changes with scattered small periventricular deep white matter hyperintensities bilaterally. Brainstem normal. Negative for hemorrhage or mass. Negative for hydrocephalus. Cerebral volume normal. Vascular: Normal arterial flow voids Skull and upper cervical spine: No focal skeletal abnormality. Sinuses/Orbits: Mild mucosal edema paranasal sinuses and right mastoid sinus. Normal orbit Other: None MRA HEAD FINDINGS Both vertebral arteries are widely patent and normal. Basilar widely patent. AICA, PICA widely patent. Superior cerebellar and posterior cerebral arteries normal bilaterally. Internal carotid artery widely patent bilaterally. Anterior and middle cerebral arteries normal bilaterally. Negative for cerebral aneurysm. IMPRESSION: 1. Negative for acute infarct. Mild chronic microvascular ischemic change in the white matter. 2. Negative MRA head Electronically Signed   By: Franchot Gallo M.D.   On: 10/14/2020 16:24   MR BRAIN WO CONTRAST  Result Date: 10/14/2020 CLINICAL DATA:  TIA. EXAM: MRI HEAD WITHOUT CONTRAST MRA HEAD WITHOUT CONTRAST TECHNIQUE:  Multiplanar, multiecho pulse sequences of the brain and surrounding structures were obtained without  intravenous contrast. Angiographic images of the head were obtained using MRA technique without contrast. COMPARISON:  CT head 10/14/2020 FINDINGS: MRI HEAD FINDINGS Brain: Negative for acute infarct. Small area of T2 shine through in the left frontal white matter. No areas of restricted diffusion. Mild white matter changes with scattered small periventricular deep white matter hyperintensities bilaterally. Brainstem normal. Negative for hemorrhage or mass. Negative for hydrocephalus. Cerebral volume normal. Vascular: Normal arterial flow voids Skull and upper cervical spine: No focal skeletal abnormality. Sinuses/Orbits: Mild mucosal edema paranasal sinuses and right mastoid sinus. Normal orbit Other: None MRA HEAD FINDINGS Both vertebral arteries are widely patent and normal. Basilar widely patent. AICA, PICA widely patent. Superior cerebellar and posterior cerebral arteries normal bilaterally. Internal carotid artery widely patent bilaterally. Anterior and middle cerebral arteries normal bilaterally. Negative for cerebral aneurysm. IMPRESSION: 1. Negative for acute infarct. Mild chronic microvascular ischemic change in the white matter. 2. Negative MRA head Electronically Signed   By: Franchot Gallo M.D.   On: 10/14/2020 16:24   US RENAL  Result Date: 10/15/2020 CLINICAL DATA:  Acute kidney injury EXAM: RENAL / URINARY TRACT ULTRASOUND COMPLETE COMPARISON:  Radiograph 10/15/2020 FINDINGS: Right Kidney: Renal measurements: 12.2 x 5.6 x 5 cm = volume: 180.6 mL. Echogenicity within normal limits. No mass or hydronephrosis visualized. Left Kidney: Renal measurements: 11.9 x 6.2 x 4.2 cm = volume: 162.9 mL. Echogenicity within normal limits. No hydronephrosis. Exophytic cyst off the upper pole measuring 3 cm. Probable small parapelvic cyst within the mid to upper pole measuring 2 cm. Bladder: Decompressed which  limits evaluation Other: Small free fluid in the right upper quadrant. Small pleural effusion. IMPRESSION: 1. No hydronephrosis. 2. Cysts within the left kidney 3. Small amount of free fluid in the abdomen. Right pleural effusion. Electronically Signed   By: Donavan Foil M.D.   On: 10/15/2020 18:47   DG Chest Port 1 View  Result Date: 10/16/2020 CLINICAL DATA:  Shortness of breath, COVID EXAM: PORTABLE CHEST 1 VIEW COMPARISON:  10/15/2020 FINDINGS: No significant change in AP portable chest radiograph, likely with small bilateral layering pleural effusions. No new or focal airspace opacity. Heart and mediastinum are IMPRESSION: No significant change in AP portable chest radiograph, likely with small bilateral layering pleural effusions. No new or focal airspace opacity. Electronically Signed   By: Eddie Candle M.D.   On: 10/16/2020 09:00   DG Chest Port 1 View  Result Date: 10/15/2020 CLINICAL DATA:  COVID positive, dyspnea EXAM: PORTABLE CHEST 1 VIEW COMPARISON:  Chest radiograph from one day prior. FINDINGS: Stable cardiomediastinal silhouette with top-normal heart size. No pneumothorax. Small left pleural effusion, stable. No right pleural effusion. No pulmonary edema. Left retrocardiac opacity with increased volume loss. IMPRESSION: 1. Stable small left pleural effusion. 2. Left retrocardiac opacity with increased volume loss, indicating left lower lobe atelectasis with possible component of pneumonia. Electronically Signed   By: Ilona Sorrel M.D.   On: 10/15/2020 07:53   DG Chest Port 1 View  Result Date: 10/14/2020 CLINICAL DATA:  COVID positive EXAM: PORTABLE CHEST 1 VIEW COMPARISON:  06/30/2017 chest radiograph. FINDINGS: Stable cardiomediastinal silhouette with normal heart size. No pneumothorax. No pleural effusion. No overt pulmonary edema. Patchy left lung base opacity. IMPRESSION: Patchy left lung base opacity, suggesting aspiration or pneumonia. Electronically Signed   By: Ilona Sorrel M.D.    On: 10/14/2020 07:39   CT HEAD CODE STROKE WO CONTRAST  Result Date: 10/14/2020 CLINICAL DATA:  Code stroke.  Suspected TIA EXAM:  CT HEAD WITHOUT CONTRAST TECHNIQUE: Contiguous axial images were obtained from the base of the skull through the vertex without intravenous contrast. COMPARISON:  None. FINDINGS: Brain: There is no mass, hemorrhage or extra-axial collection. The size and configuration of the ventricles and extra-axial CSF spaces are normal. The brain parenchyma is normal, without evidence of acute or chronic infarction. Vascular: No abnormal hyperdensity of the major intracranial arteries or dural venous sinuses. No intracranial atherosclerosis. Skull: The visualized skull base, calvarium and extracranial soft tissues are normal. Sinuses/Orbits: No fluid levels or advanced mucosal thickening of the visualized paranasal sinuses. No mastoid or middle ear effusion. The orbits are normal. ASPECTS [facility] Stroke Program Early CT Score) - Ganglionic level infarction (caudate, lentiform nuclei, internal capsule, insula, M1-M3 cortex): 7 - Supraganglionic infarction (M4-M6 cortex): 3 Total score (0-10 with 10 being normal): 10 IMPRESSION: 1. Normal head CT. 2. ASPECTS is 10. 3. These results were communicated to Dr. Donnetta Simpers at 1:45 am on 10/14/2020 by telephone. Electronically Signed   By: Ulyses Jarred M.D.   On: 10/14/2020 01:46   VAS US CAROTID  Result Date: 10/15/2020 Carotid Arterial Duplex Study Indications:       Speech disturbance and Confusion, facial droop. Other Factors:     Covid-19, atrial fibrillation, CHF,. Limitations        Today's exam was limited due to Rapid atrial fibrillation and                    rapid, heavy breathing. Comparison Study:  No prior study Performing Technologist: Sharion Dove RVS  Examination Guidelines: A complete evaluation includes B-mode imaging, spectral Doppler, color Doppler, and power Doppler as needed of all accessible portions of each vessel.  Bilateral testing is considered an integral part of a complete examination. Limited examinations for reoccurring indications may be performed as noted.  Right Carotid Findings: +----------+--------+--------+--------+------------------+------------------+           PSV cm/sEDV cm/sStenosisPlaque DescriptionComments           +----------+--------+--------+--------+------------------+------------------+ CCA Prox  104     38                                intimal thickening +----------+--------+--------+--------+------------------+------------------+ CCA Distal88      22                                intimal thickening +----------+--------+--------+--------+------------------+------------------+ ICA Prox  72      26                                                   +----------+--------+--------+--------+------------------+------------------+ ICA Distal93      35                                                   +----------+--------+--------+--------+------------------+------------------+ ECA       67      14                                                   +----------+--------+--------+--------+------------------+------------------+ +----------+--------+-------+--------+-------------------+  PSV cm/sEDV cmsDescribeArm Pressure (mmHG) +----------+--------+-------+--------+-------------------+ Subclavian171                                        +----------+--------+-------+--------+-------------------+ +---------+--------+--+--------+--+ VertebralPSV cm/s77EDV cm/s19 +---------+--------+--+--------+--+  Left Carotid Findings: +----------+--------+--------+--------+------------------+------------------+           PSV cm/sEDV cm/sStenosisPlaque DescriptionComments           +----------+--------+--------+--------+------------------+------------------+ CCA Prox  132     48                                intimal thickening  +----------+--------+--------+--------+------------------+------------------+ CCA Distal175     43                                intimal thickening +----------+--------+--------+--------+------------------+------------------+ ICA Prox  119     33                                                   +----------+--------+--------+--------+------------------+------------------+ ICA Distal109     42                                                   +----------+--------+--------+--------+------------------+------------------+ ECA       108     10                                                   +----------+--------+--------+--------+------------------+------------------+ +----------+--------+--------+--------+-------------------+           PSV cm/sEDV cm/sDescribeArm Pressure (mmHG) +----------+--------+--------+--------+-------------------+ Subclavian212                                         +----------+--------+--------+--------+-------------------+ +---------+--------+---+--------+--+ VertebralPSV cm/s101EDV cm/s30 +---------+--------+---+--------+--+   Summary: Right Carotid: The extracranial vessels were near-normal with only minimal wall                thickening or plaque. Left Carotid: The extracranial vessels were near-normal with only minimal wall               thickening or plaque. Vertebrals:  Bilateral vertebral arteries demonstrate antegrade flow. Subclavians: Normal flow hemodynamics were seen in bilateral subclavian              arteries. *See table(s) above for measurements and observations.  Electronically signed by Antony Contras MD on 10/15/2020 at 2:20:13 PM.    Final      Assessment and Plan:   Chronic Combined CHF/ Non-Ischemic Cardiomyopathy - Patient presented with acute stroke and family requested Cardiology consult for management of chronic CHF. - EF as low as 20-25% in 03/2019 but improved to 45% on Echo in 08/2019 and is normal on current  echo. Evolution over time is highly suggestive of resolving tachycardia-related cardiomyopathy. Cardiac cath in 04/2019 showed only minimal CAD. -  BNP elevated and trending up 137 >> 238 >> 811, but this is not unexpected with severe viral illness. - Chest x-ray showed patchy left lung base opacity. - Would defer diuresis to Nephrology given severe AKI. Temporary HD is being considered. - Continue to hold home Entresto due to hypotension and renal failure. - beta blocker preferred agent for rate control, but will use amiodarone if BP is low - Continue to monitor daily weights, strict I/O's, and renal function.  Paroxysmal Atrial Flutter/Fibrillation - History of paroxysmal atrial flutter s/p DCCV in 05/2019. This admission has had atrial fibrillation with elevated rates.  - Still in atrial fibrillation at this time with rates in the 100's to 110's. - Potassium 3.4 yesterday but 4.0 today after repletion. - Magnesium 1.2 on admission and 2.2 today after repletion. - Repeat Echo pending. - Metoprolol currently on hold due to soft BP. Suspect elevated rates due to underlying illnesses. BP has improved so can try to restart home beta-blocker if needed. - Continue Eliquis 60m twice daily.  Demand Ischemia - High-sensitivity troponin mildly elevated and flat at 50 >> 61.  - EKG shows no acute ischemic changes.  - Echo pending.  - Not consistent with ACS. Minimal CAD on cath in 04/2019 reassuring. Most likely demand ischemia in setting of underlying illness.  AKI - Creatinine 5.89 on admission and 7.98 today even after fluids. Baseline 1.19. - Urinalysis unremarkable.  - Nephrology consulted and AKI felt to be secondary to hypotension/hypovolemia/Entresto. They recommended renal ultrasound.  - Continue to monitor closely.   TIA - Patient presented with facial droop and altered mental status.  - Head CT/Brain MRI unremarkable. - Already on Eliquis.  Hypocalcemia - Calcium 4.5 on admission and  5.3 today. - Management per primary team.   For questions or updates, please contact [email or web address] consult [email or web address] for contact info under   MSanda Klein MD, F[facility] [phone] [phone] 10/16/2020.4:45 PM

## 2020-10-16 NOTE — Evaluation (Signed)
Clinical/Bedside Swallow Evaluation Patient Details  Name: Raymond Hurley. MRN: [id number] Date of Birth: [birth year]  Today's Date: 10/16/2020 Time: SLP Start Time (ACUTE ONLY): 0950 SLP Stop Time (ACUTE ONLY): 1003 SLP Time Calculation (min) (ACUTE ONLY): 13 min  Past Medical History:  Past Medical History:  Diagnosis Date  . Atrial fibrillation ([place])   . Melanoma ([place])   . Vitamin B 12 deficiency    Past Surgical History:  Past Surgical History:  Procedure Laterality Date  . CARDIOVERSION N/A 06/02/2019   Procedure: CARDIOVERSION;  Surgeon: Jerline Pain, MD;  Location: [facility] ENDOSCOPY;  Service: Cardiovascular;  Laterality: N/A;  . RIGHT/LEFT HEART CATH AND CORONARY ANGIOGRAPHY N/A 04/27/2019   Procedure: RIGHT/LEFT HEART CATH AND CORONARY ANGIOGRAPHY;  Surgeon: Jolaine Artist, MD;  Location: [place] CV LAB;  Service: Cardiovascular;  Laterality: N/A;  . TEE WITHOUT CARDIOVERSION N/A 06/02/2019   Procedure: TRANSESOPHAGEAL ECHOCARDIOGRAM (TEE);  Surgeon: Jerline Pain, MD;  Location: [facility] ENDOSCOPY;  Service: Cardiovascular;  Laterality: N/A;   HPI:  Pt is an 87 y.o. male with medical history significant for atrial fibrillation on chronic anticoagulation, systolic CHF, melanoma, and vitamin B12 deficiency who presented after being found to be acutely altered with fever four days prior to admission. CXR 1/2: Stable small left pleural effusion. Left retrocardiac opacity with increased volume loss, indicating left lower lobe atelectasis with possible component of pneumonia. Found to have [place]. MRI brain negative.   Assessment / Plan / Recommendation Clinical Impression  Pt was seen for bedside swallow evaluation and he denied a history of dysphagia. Oral mechanism exam was [facility] and dentition adequate, He tolerated puree solids and thin liquids via cup and straw without overt s/sx of aspiration. Pt was intermittently dyspneic and he exhibited inconsistent coughing with regular  texture solids. No other symptoms of oropharyngeal dysphagia were noted. Considering pt's respiratory status and his presentation, his diet will be modified to dysphagia 3 solids and thin liquids. SLP will follow to assess diet tolerance, potential for diet advancement, and to determine the need for instrumental assessment. SLP Visit Diagnosis: Dysphagia, unspecified (R13.10)    Aspiration Risk  Mild aspiration risk    Diet Recommendation Thin liquid;Dysphagia 3 (Mech soft)   Liquid Administration via: Cup;Straw Medication Administration: Whole meds with liquid Supervision: Patient able to self feed Compensations: Slow rate;Small sips/bites Postural Changes: Seated upright at 90 degrees    Other  Recommendations Oral Care Recommendations: Oral care BID   Follow up Recommendations None      Frequency and Duration min 2x/week  1 week       Prognosis        Swallow Study   General Date of Onset: 10/15/20 HPI: Pt is an 87 y.o. male with medical history significant for atrial fibrillation on chronic anticoagulation, systolic CHF, melanoma, and vitamin B12 deficiency who presented after being found to be acutely altered with fever four days prior to admission. CXR 1/2: Stable small left pleural effusion. Left retrocardiac opacity with increased volume loss, indicating left lower lobe atelectasis with possible component of pneumonia. Found to have [place]. MRI brain negative. Type of Study: Bedside Swallow Evaluation Previous Swallow Assessment: None Diet Prior to this Study: Regular;Thin liquids Temperature Spikes Noted: No Respiratory Status: Room air History of Recent Intubation: No Behavior/Cognition: Alert;Cooperative;Pleasant mood Oral Cavity Assessment: Within Functional Limits Oral Care Completed by SLP: No Oral Cavity - Dentition: Adequate natural dentition Vision: Functional for self-feeding Self-Feeding Abilities: Able to feed self Patient Positioning:  Upright in  bed;Postural control adequate for testing Baseline Vocal Quality: Normal Volitional Cough: Strong    Oral/Motor/Sensory Function Overall Oral Motor/Sensory Function: Within functional limits   Ice Chips Ice chips: Not tested   Thin Liquid Thin Liquid: Within functional limits Presentation: Straw    Nectar Thick Nectar Thick Liquid: Not tested   Honey Thick Honey Thick Liquid: Not tested   Puree Puree: Within functional limits Presentation: Spoon   Solid     Solid: Impaired Presentation: Self Fed Pharyngeal Phase Impairments: Cough - Delayed;Cough - Immediate     Raymond Hurley, [place], [place] Office number [phone] Pager [phone]  Horton Marshall 10/16/2020,10:13 AM

## 2020-10-16 NOTE — Progress Notes (Signed)
Pt BP was 100/43 with unchanged labored breathing SPO2 95%  3L [place]. Dr Myna Hidalgo MD was notified via [place]. Albumin was ordered and Melatonin to help with sleep.

## 2020-10-16 NOTE — Progress Notes (Signed)
  Echocardiogram 2D Echocardiogram has been performed.  Matilde Bash 10/16/2020, 11:47 AM

## 2020-10-16 NOTE — Progress Notes (Addendum)
PROGRESS NOTE                                                                                                                                                                                                             Patient Demographics:    Raymond Hurley, is a 87 y.o. male, DOB - [birth year], JGG:[id number]  Outpatient Primary MD for the patient is Lajean Manes, MD    LOS - 2  Admit date - 10/14/2020    Chief Complaint  Patient presents with  . Code Stroke       Brief Narrative (HPI from H&P)  Raymond Hurley. is a 87 y.o. male with medical history significant of atrial fibrillation on chronic anticoagulation, systolic congestive heart failure last EF 45%, melanoma, and vitamin B12 deficiency presents after being found to be acutely altered, in the ER he was diagnosed with COVID-19 pneumonia, aspiration pneumonia, there was suspicion that he had possible TIA causing transient right facial droop, he was found to be septic, hypotensive with AKI and admitted to the hospital   Subjective:   Patient in bed appears to be mildly confused, denies any headache chest or abdominal pain, no shortness of breath at this time.   Assessment  & Plan :    1. Acute Hypoxic Resp. Failure due to Acute Covid 19 Viral Pneumonitis along with possible aspiration pneumonia causing sepsis - he is fully vaccinated including the booster dose, I think his major problem right now is COVID-19 causing inflammation with toxic encephalopathy resulting in aspiration pneumonia in full-blown sepsis -  Follow cultures, procalcitonin, hydrate with IV fluids, place him on Unasyn and doxycycline along with IV steroids, COVID-19 is not likely affecting his lungs at this point.  Poor candidate for Actemra or Baricitinib anyways due to concurrent bacterial infection.  Placed him on steroids.  PCCM is on board as well.  Extremely tenuous.  Follow cultures, will  get CT abdomen pelvis noncontrast to rule out any other focus of infection, follow MRSA nasal PCR as well.  Encouraged the patient to sit up in chair in the daytime use I-S and flutter valve for pulmonary toiletry and then prone in bed when at night.  Will advance activity and titrate down oxygen as possible.  Addendum -  at 1:40 PM CT scan back.  Shows unexpected severe acute  pancreatitis.  Will call GI.  Patient is pain-free, check lipase and lipid panel.   SpO2: 96 % O2 Flow Rate (L/min): 3 L/min  Recent Labs  Lab 10/14/20 0123 10/14/20 0129 10/14/20 0144 10/14/20 0221 10/14/20 0412 10/14/20 0909 10/15/20 0445 10/15/20 1637 10/16/20 0949 10/16/20 0953  WBC 30.6*  --   --   --   --   --  42.7*  --  50.7*  --   HGB 12.5* 13.6  --   --   --   --  11.2*  --  10.9*  --   HCT 36.9* 40.0  --   --   --   --  34.3*  --  31.9*  --   PLT 162  --   --   --   --   --  157  --  170  --   CRP  --   --   --   --   --  32.0* 36.3*  --  32.8*  --   BNP  --   --  137.7*  --   --   --  238.6*  --   --  811.3*  DDIMER  --   --   --   --   --  5.74* 7.77*  --  10.70*  --   PROCALCITON  --   --   --   --   --  2.87  --  5.45 4.97  --   AST 86*  --   --   --   --   --  65*  --  54*  --   ALT 68*  --   --   --   --   --  48*  --  43  --   ALKPHOS 56  --   --   --   --   --  53  --  66  --   BILITOT 0.9  --   --   --   --   --  0.6  --  0.5  --   ALBUMIN 2.8*  --   --   --   --   --  2.1*  --  2.5*  --   INR 1.4*  --   --   --   --   --   --   --   --   --   LATICACIDVEN  --   --  2.5*  --  1.8  --   --   --   --   --   SARSCOV2NAA  --   --   --  POSITIVE*  --   --   --   --   --   --     2.  Sepsis and dehydration causing profound AKI.  Hydrate with IV fluids, steroids IV, nonacute renal ultrasound, nephrology on board may progress towards dialysis.  3.  Constipation.  Placed on bowel regimen   4.  History of urinary retention.  Monitor bladder scans.  5.  Hypokalemia.  Replaced.  6.   Transient facial droop on the left side.  Resolved MRI negative.  Already on Eliquis >> Hep Gtt.  7.  Accessible A. fib.  Mali vas 2 score of greater than 3.  Currently on Eliquis.  8.  History of chronic systolic heart failure.  Last EF 45%.  Currently profoundly dehydrated and hypotensive requiring IV fluids.  Repeat echo pending.  Family request cardiology consult which  will be requested  9.  Hypocalcemia.  Placed again 4 g on 10/16/2020.  10. Psych encephalopathy.  Supportive care, minimize narcotics and benzodiazepines.  As needed Haldol if needed.  11.  Mild elevation of troponin.  Non-ACS pattern, no chest pain, likely due to profound hypotension causing demand mismatch.  Echo to evaluate wall motion and EF, on Eliquis.      Condition - Extremely Guarded  Family Communication  :  Wife Webb Silversmith [phone] on 10/15/20, updated 10/16/2020, she is very upset that why his kidneys have not recovered and was in a very bad mood and shouting.  Code Status :    Consults  :  Renal, PCCM  Procedures  :    CT abdomen pelvis noncontrast.    TTE  Renal US - non acute  MRI - 1. Negative for acute infarct. Mild chronic microvascular ischemic change in the white matter. 2. Negative MRA  PUD Prophylaxis : PPI  Disposition Plan  :    Status is: Inpatient  Remains inpatient appropriate because:IV treatments appropriate due to intensity of illness or inability to take PO   Dispo: The patient is from: Home              Anticipated d/c is to: SNF              Anticipated d/c date is: > 3 days              Patient currently is not medically stable to d/c.   DVT Prophylaxis  : Eliquis  Lab Results  Component Value Date   PLT 170 10/16/2020    Diet :  Diet Order            DIET DYS 3 Room service appropriate? Yes with Assist; Fluid consistency: Thin  Diet effective now                  Inpatient Medications  Scheduled Meds: . albuterol  2 puff Inhalation Q6H  . vitamin C   500 mg Oral Daily  . Chlorhexidine Gluconate Cloth  6 each Topical Daily  . Gerhardt's butt cream   Topical TID  . methylPREDNISolone (SOLU-MEDROL) injection  60 mg Intravenous Q12H  . pantoprazole  40 mg Oral Daily  . Pedialyte  1,000 mL Oral Q4H  . QUEtiapine  25 mg Oral BID  . sodium chloride flush  3 mL Intravenous Q12H  . zinc sulfate  220 mg Oral Daily   Continuous Infusions: . sodium chloride 100 mL/hr at 10/15/20 0455  . ampicillin-sulbactam (UNASYN) IV 3 g (10/16/20 1011)  . calcium gluconate    . doxycycline (VIBRAMYCIN) IV 100 mg (10/16/20 1148)  . heparin     PRN Meds:.acetaminophen, chlorpheniramine-HYDROcodone, guaiFENesin-dextromethorphan, haloperidol lactate, loperamide, melatonin, polyethylene glycol, triamcinolone  Antibiotics  :    Anti-infectives (From admission, onward)   Start     Dose/Rate Route Frequency Ordered Stop   10/15/20 1000  remdesivir 100 mg in sodium chloride 0.9 % 100 mL IVPB  Status:  Discontinued       "Followed by" Linked Group Details   100 mg 200 mL/hr over 30 Minutes Intravenous Daily 10/14/20 0828 10/14/20 1324   10/15/20 1000  doxycycline (VIBRAMYCIN) 100 mg in sodium chloride 0.9 % 250 mL IVPB        100 mg 125 mL/hr over 120 Minutes Intravenous Every 12 hours 10/15/20 0952     10/15/20 1000  Ampicillin-Sulbactam (UNASYN) 3 g in sodium chloride 0.9 % 100  mL IVPB        3 g 200 mL/hr over 30 Minutes Intravenous Every 24 hours 10/15/20 0952     10/14/20 0830  remdesivir 200 mg in sodium chloride 0.9% 250 mL IVPB       "Followed by" Linked Group Details   200 mg 580 mL/hr over 30 Minutes Intravenous Once 10/14/20 0828 10/14/20 1100   10/14/20 0830  cefTRIAXone (ROCEPHIN) 2 g in sodium chloride 0.9 % 100 mL IVPB  Status:  Discontinued        2 g 200 mL/hr over 30 Minutes Intravenous Every 24 hours 10/14/20 0829 10/15/20 0952       Time Spent in minutes  30   Lala Lund M.D on 10/16/2020 at 12:07 PM  To page go to [email or web address]    [facility] -  Office  [phone]     See all Orders from today for further details    Objective:   Vitals:   10/15/20 2343 10/16/20 0012 10/16/20 0431 10/16/20 0750  BP: (!) 100/43 (!) 110/54 (!) 110/52 111/85  Pulse: 98 99 (!) 105 97  Resp: 20 20 19 20   Temp: 97.9 F (36.6 C) 98.2 F (36.8 C) 98 F (36.7 C) 98.1 F (36.7 C)  TempSrc: Oral Oral Oral Oral  SpO2: 94% 96% 97% 96%  Weight:      Height:        Wt Readings from Last 3 Encounters:  10/14/20 95.1 kg  07/05/20 95.1 kg  06/13/20 96.2 kg     Intake/Output Summary (Last 24 hours) at 10/16/2020 1207 Last data filed at 10/16/2020 0700 Gross per 24 hour  Intake 714.25 ml  Output 50 ml  Net 664.25 ml     Physical Exam  Awake mildy confused, No new F.N deficits, Normal affect Bric[place].AT,PERRAL Supple Neck,No JVD, No cervical lymphadenopathy appriciated.  Symmetrical Chest wall movement, Good air movement bilaterally, CTAB RRR,No Gallops, Rubs or new Murmurs, No Parasternal Heave +ve B.Sounds, Abd Soft, No tenderness, No organomegaly appriciated, No rebound - guarding or rigidity. No Cyanosis, Clubbing or edema, No new Rash or bruise       Data Review:    CBC Recent Labs  Lab 10/14/20 0123 10/14/20 0129 10/15/20 0445 10/16/20 0949  WBC 30.6*  --  42.7* 50.7*  HGB 12.5* 13.6 11.2* 10.9*  HCT 36.9* 40.0 34.3* 31.9*  PLT 162  --  157 170  MCV 107.9*  --  107.9* 105.3*  MCH 36.5*  --  35.2* 36.0*  MCHC 33.9  --  32.7 34.2  RDW 14.3  --  14.3 14.9  LYMPHSABS 0.7  --  0.5* 0.7  MONOABS 9.6*  --  10.1* 7.8*  EOSABS 0.0  --  0.0 0.1  BASOSABS 0.0  --  0.0 0.0    Recent Labs  Lab 10/14/20 0123 10/14/20 0129 10/14/20 0144 10/14/20 0412 10/14/20 0909 10/15/20 0445 10/15/20 1637 10/16/20 0949 10/16/20 0953  NA 130* 130*  --   --  129* 131*  --  130*  --   K 3.8 3.8  --   --  3.4* 3.4*  --  4.0  --   CL 96* 96*  --   --  97* 101  --  98  --   CO2 18*  --   --   --  19* 13*  --  10*  --    GLUCOSE 145* 138*  --   --  103* 107*  --  111*  --  BUN 57* 59*  --   --  56* 72*  --  98*  --   CREATININE 5.89* 5.60*  --   --  5.08* 6.34*  --  7.98*  --   CALCIUM 4.5*  --   --   --  4.5* 5.1*  --  5.3*  --   AST 86*  --   --   --   --  65*  --  54*  --   ALT 68*  --   --   --   --  48*  --  43  --   ALKPHOS 56  --   --   --   --  53  --  66  --   BILITOT 0.9  --   --   --   --  0.6  --  0.5  --   ALBUMIN 2.8*  --   --   --   --  2.1*  --  2.5*  --   MG  --   --   --   --  1.2* 2.2  --  2.2  --   CRP  --   --   --   --  32.0* 36.3*  --  32.8*  --   DDIMER  --   --   --   --  5.74* 7.77*  --  10.70*  --   PROCALCITON  --   --   --   --  2.87  --  5.45 4.97  --   LATICACIDVEN  --   --  2.5* 1.8  --   --   --   --   --   INR 1.4*  --   --   --   --   --   --   --   --   HGBA1C  --   --   --   --  5.7*  --   --   --   --   BNP  --   --  137.7*  --   --  238.6*  --   --  811.3*    ------------------------------------------------------------------------------------------------------------------ Recent Labs    10/14/20 0909  CHOL 117  HDL 44  LDLCALC 57  TRIG 82  CHOLHDL 2.7    Lab Results  Component Value Date   HGBA1C 5.7 (H) 10/14/2020   ------------------------------------------------------------------------------------------------------------------ No results for input(s): TSH, T4TOTAL, T3FREE, THYROIDAB in the last 72 hours.  Invalid input(s): FREET3  Cardiac Enzymes No results for input(s): CKMB, TROPONINI, MYOGLOBIN in the last 168 hours.  Invalid input(s): CK ------------------------------------------------------------------------------------------------------------------    Component Value Date/Time   BNP 811.3 (H) 10/16/2020 2751    Micro Results Recent Results (from the past 240 hour(s))  Resp Panel by RT-PCR (Flu A&B, Covid) Nasopharyngeal Swab     Status: Abnormal   Collection Time: 10/14/20  2:21 AM   Specimen: Nasopharyngeal Swab;  Nasopharyngeal(NP) swabs in vial transport medium  Result Value Ref Range Status   SARS Coronavirus 2 by RT PCR POSITIVE (A) NEGATIVE Final    Comment: RESULT CALLED TO, READ BACK BY AND VERIFIED WITH: G CERER RN 10/14/20 0349 JDW (NOTE) SARS-CoV-2 target nucleic acids are DETECTED.  The SARS-CoV-2 RNA is generally detectable in upper respiratory specimens during the acute phase of infection. Positive results are indicative of the presence of the identified virus, but do not rule out bacterial infection or co-infection with other pathogens not detected by the test. Clinical correlation  with patient history and other diagnostic information is necessary to determine patient infection status. The expected result is Negative.  Fact Sheet for Patients: EntrepreneurPulse.com.au  Fact Sheet for Healthcare Providers: IncredibleEmployment.be  This test is not yet approved or cleared by the [place] FDA and  has been authorized for detection and/or diagnosis of SARS-CoV-2 by FDA under an Emergency Use Authorization (EUA).  This EUA will remain in effect (meaning this test can be used)  for the duration of  the COVID-19 declaration under Section 564(b)(1) of the Act, 21 U.S.C. section 360bbb-3(b)(1), unless the authorization is terminated or revoked sooner.     Influenza A by PCR NEGATIVE NEGATIVE Final   Influenza B by PCR NEGATIVE NEGATIVE Final    Comment: (NOTE) The Xpert Xpress SARS-CoV-2/FLU/RSV plus assay is intended as an aid in the diagnosis of influenza from Nasopharyngeal swab specimens and should not be used as a sole basis for treatment. Nasal washings and aspirates are unacceptable for Xpert Xpress SARS-CoV-2/FLU/RSV testing.  Fact Sheet for Patients: EntrepreneurPulse.com.au  Fact Sheet for Healthcare Providers: IncredibleEmployment.be  This test is not yet approved or cleared by the [place] FDA and has been authorized for detection and/or diagnosis of SARS-CoV-2 by FDA under an Emergency Use Authorization (EUA). This EUA will remain in effect (meaning this test can be used) for the duration of the COVID-19 declaration under Section 564(b)(1) of the Act, 21 U.S.C. section 360bbb-3(b)(1), unless the authorization is terminated or revoked.  Performed at [facility], [place] [address], [place], [place] [address]   Culture, blood (routine x 2)     Status: None (Preliminary result)   Collection Time: 10/14/20 10:36 AM   Specimen: BLOOD RIGHT HAND  Result Value Ref Range Status   Specimen Description BLOOD RIGHT HAND  Final   Special Requests   Final    BOTTLES DRAWN AEROBIC AND ANAEROBIC Blood Culture adequate volume   Culture   Final    NO GROWTH < 24 HOURS Performed at [facility], [place] [address] [place], [place] [address]    Report Status PENDING  Incomplete  Culture, blood (routine x 2)     Status: None (Preliminary result)   Collection Time: 10/14/20 10:36 AM   Specimen: BLOOD LEFT HAND  Result Value Ref Range Status   Specimen Description BLOOD LEFT HAND  Final   Special Requests   Final    BOTTLES DRAWN AEROBIC AND ANAEROBIC Blood Culture results may not be optimal due to an inadequate volume of blood received in culture bottles   Culture   Final    NO GROWTH < 24 HOURS Performed at [facility], [place] [address], [place], [place] [address]    Report Status PENDING  Incomplete  Gastrointestinal Panel by PCR , Stool     Status: None   Collection Time: 10/14/20 11:00 AM   Specimen: Stool  Result Value Ref Range Status   Campylobacter species NOT DETECTED NOT DETECTED Final   Plesimonas shigelloides NOT DETECTED NOT DETECTED Final   Salmonella species NOT DETECTED NOT DETECTED Final   Yersinia enterocolitica NOT DETECTED NOT DETECTED Final   Vibrio species NOT DETECTED NOT DETECTED Final   Vibrio cholerae NOT DETECTED NOT DETECTED Final    Enteroaggregative E coli (EAEC) NOT DETECTED NOT DETECTED Final   Enteropathogenic E coli (EPEC) NOT DETECTED NOT DETECTED Final   Enterotoxigenic E coli (ETEC) NOT DETECTED NOT DETECTED Final   Shiga like toxin producing E  coli (STEC) NOT DETECTED NOT DETECTED Final   Shigella/Enteroinvasive E coli (EIEC) NOT DETECTED NOT DETECTED Final   Cryptosporidium NOT DETECTED NOT DETECTED Final   Cyclospora cayetanensis NOT DETECTED NOT DETECTED Final   Entamoeba histolytica NOT DETECTED NOT DETECTED Final   Giardia lamblia NOT DETECTED NOT DETECTED Final   Adenovirus F40/41 NOT DETECTED NOT DETECTED Final   Astrovirus NOT DETECTED NOT DETECTED Final   Norovirus GI/GII NOT DETECTED NOT DETECTED Final   Rotavirus A NOT DETECTED NOT DETECTED Final   Sapovirus (I, II, IV, and V) NOT DETECTED NOT DETECTED Final    Comment: Performed at [facility], [address] [place], [place] [address]  C Difficile Quick Screen w PCR reflex     Status: None   Collection Time: 10/14/20 11:00 AM   Specimen: STOOL  Result Value Ref Range Status   C Diff antigen NEGATIVE NEGATIVE Final   C Diff toxin NEGATIVE NEGATIVE Final   C Diff interpretation No C. difficile detected.  Final    Comment: Performed at [facility], [place] [address] [place], [place] [address]  MRSA PCR Screening     Status: None   Collection Time: 10/15/20 12:21 PM   Specimen: Nasal Mucosa; Nasopharyngeal  Result Value Ref Range Status   MRSA by PCR NEGATIVE NEGATIVE Final    Comment:        The GeneXpert MRSA Assay (FDA approved for NASAL specimens only), is one component of a comprehensive MRSA colonization surveillance program. It is not intended to diagnose MRSA infection nor to guide or monitor treatment for MRSA infections. Performed at [facility], [place] [address], [place], [place] [address]     Radiology Reports DG Abd 1 View  Result Date: 10/15/2020 CLINICAL DATA:  Constipation. EXAM: ABDOMEN - 1  VIEW COMPARISON:  10/14/2020 FINDINGS: Mild gaseous distention of the colon is likely consistent with a component of ileus. No significant retained stool. No small bowel dilatation. IMPRESSION: Probable mild colonic ileus. Electronically Signed   By: Aletta Edouard M.D.   On: 10/15/2020 09:37   DG Abd 1 View  Result Date: 10/14/2020 CLINICAL DATA:  Diarrhea EXAM: ABDOMEN - 1 VIEW COMPARISON:  None. FINDINGS: The bowel gas pattern is normal. No radio-opaque calculi or other significant radiographic abnormality are seen. IMPRESSION: Negative. Electronically Signed   By: Rolm Baptise M.D.   On: 10/14/2020 13:02   MR ANGIO HEAD WO CONTRAST  Result Date: 10/14/2020 CLINICAL DATA:  TIA. EXAM: MRI HEAD WITHOUT CONTRAST MRA HEAD WITHOUT CONTRAST TECHNIQUE: Multiplanar, multiecho pulse sequences of the brain and surrounding structures were obtained without intravenous contrast. Angiographic images of the head were obtained using MRA technique without contrast. COMPARISON:  CT head 10/14/2020 FINDINGS: MRI HEAD FINDINGS Brain: Negative for acute infarct. Small area of T2 shine through in the left frontal white matter. No areas of restricted diffusion. Mild white matter changes with scattered small periventricular deep white matter hyperintensities bilaterally. Brainstem normal. Negative for hemorrhage or mass. Negative for hydrocephalus. Cerebral volume normal. Vascular: Normal arterial flow voids Skull and upper cervical spine: No focal skeletal abnormality. Sinuses/Orbits: Mild mucosal edema paranasal sinuses and right mastoid sinus. Normal orbit Other: None MRA HEAD FINDINGS Both vertebral arteries are widely patent and normal. Basilar widely patent. AICA, PICA widely patent. Superior cerebellar and posterior cerebral arteries normal bilaterally. Internal carotid artery widely patent bilaterally. Anterior and middle cerebral arteries normal bilaterally. Negative for cerebral aneurysm. IMPRESSION: 1. Negative for  acute  infarct. Mild chronic microvascular ischemic change in the white matter. 2. Negative MRA head Electronically Signed   By: Franchot Gallo M.D.   On: 10/14/2020 16:24   MR BRAIN WO CONTRAST  Result Date: 10/14/2020 CLINICAL DATA:  TIA. EXAM: MRI HEAD WITHOUT CONTRAST MRA HEAD WITHOUT CONTRAST TECHNIQUE: Multiplanar, multiecho pulse sequences of the brain and surrounding structures were obtained without intravenous contrast. Angiographic images of the head were obtained using MRA technique without contrast. COMPARISON:  CT head 10/14/2020 FINDINGS: MRI HEAD FINDINGS Brain: Negative for acute infarct. Small area of T2 shine through in the left frontal white matter. No areas of restricted diffusion. Mild white matter changes with scattered small periventricular deep white matter hyperintensities bilaterally. Brainstem normal. Negative for hemorrhage or mass. Negative for hydrocephalus. Cerebral volume normal. Vascular: Normal arterial flow voids Skull and upper cervical spine: No focal skeletal abnormality. Sinuses/Orbits: Mild mucosal edema paranasal sinuses and right mastoid sinus. Normal orbit Other: None MRA HEAD FINDINGS Both vertebral arteries are widely patent and normal. Basilar widely patent. AICA, PICA widely patent. Superior cerebellar and posterior cerebral arteries normal bilaterally. Internal carotid artery widely patent bilaterally. Anterior and middle cerebral arteries normal bilaterally. Negative for cerebral aneurysm. IMPRESSION: 1. Negative for acute infarct. Mild chronic microvascular ischemic change in the white matter. 2. Negative MRA head Electronically Signed   By: Franchot Gallo M.D.   On: 10/14/2020 16:24   US RENAL  Result Date: 10/15/2020 CLINICAL DATA:  Acute kidney injury EXAM: RENAL / URINARY TRACT ULTRASOUND COMPLETE COMPARISON:  Radiograph 10/15/2020 FINDINGS: Right Kidney: Renal measurements: 12.2 x 5.6 x 5 cm = volume: 180.6 mL. Echogenicity within normal limits. No mass or  hydronephrosis visualized. Left Kidney: Renal measurements: 11.9 x 6.2 x 4.2 cm = volume: 162.9 mL. Echogenicity within normal limits. No hydronephrosis. Exophytic cyst off the upper pole measuring 3 cm. Probable small parapelvic cyst within the mid to upper pole measuring 2 cm. Bladder: Decompressed which limits evaluation Other: Small free fluid in the right upper quadrant. Small pleural effusion. IMPRESSION: 1. No hydronephrosis. 2. Cysts within the left kidney 3. Small amount of free fluid in the abdomen. Right pleural effusion. Electronically Signed   By: Donavan Foil M.D.   On: 10/15/2020 18:47   DG Chest Port 1 View  Result Date: 10/16/2020 CLINICAL DATA:  Shortness of breath, COVID EXAM: PORTABLE CHEST 1 VIEW COMPARISON:  10/15/2020 FINDINGS: No significant change in AP portable chest radiograph, likely with small bilateral layering pleural effusions. No new or focal airspace opacity. Heart and mediastinum are IMPRESSION: No significant change in AP portable chest radiograph, likely with small bilateral layering pleural effusions. No new or focal airspace opacity. Electronically Signed   By: Eddie Candle M.D.   On: 10/16/2020 09:00   DG Chest Port 1 View  Result Date: 10/15/2020 CLINICAL DATA:  COVID positive, dyspnea EXAM: PORTABLE CHEST 1 VIEW COMPARISON:  Chest radiograph from one day prior. FINDINGS: Stable cardiomediastinal silhouette with top-normal heart size. No pneumothorax. Small left pleural effusion, stable. No right pleural effusion. No pulmonary edema. Left retrocardiac opacity with increased volume loss. IMPRESSION: 1. Stable small left pleural effusion. 2. Left retrocardiac opacity with increased volume loss, indicating left lower lobe atelectasis with possible component of pneumonia. Electronically Signed   By: Ilona Sorrel M.D.   On: 10/15/2020 07:53   DG Chest Port 1 View  Result Date: 10/14/2020 CLINICAL DATA:  COVID positive EXAM: PORTABLE CHEST 1 VIEW COMPARISON:  06/30/2017  chest radiograph. FINDINGS: Stable  cardiomediastinal silhouette with normal heart size. No pneumothorax. No pleural effusion. No overt pulmonary edema. Patchy left lung base opacity. IMPRESSION: Patchy left lung base opacity, suggesting aspiration or pneumonia. Electronically Signed   By: Ilona Sorrel M.D.   On: 10/14/2020 07:39   CT HEAD CODE STROKE WO CONTRAST  Result Date: 10/14/2020 CLINICAL DATA:  Code stroke.  Suspected TIA EXAM: CT HEAD WITHOUT CONTRAST TECHNIQUE: Contiguous axial images were obtained from the base of the skull through the vertex without intravenous contrast. COMPARISON:  None. FINDINGS: Brain: There is no mass, hemorrhage or extra-axial collection. The size and configuration of the ventricles and extra-axial CSF spaces are normal. The brain parenchyma is normal, without evidence of acute or chronic infarction. Vascular: No abnormal hyperdensity of the major intracranial arteries or dural venous sinuses. No intracranial atherosclerosis. Skull: The visualized skull base, calvarium and extracranial soft tissues are normal. Sinuses/Orbits: No fluid levels or advanced mucosal thickening of the visualized paranasal sinuses. No mastoid or middle ear effusion. The orbits are normal. ASPECTS [facility] Stroke Program Early CT Score) - Ganglionic level infarction (caudate, lentiform nuclei, internal capsule, insula, M1-M3 cortex): 7 - Supraganglionic infarction (M4-M6 cortex): 3 Total score (0-10 with 10 being normal): 10 IMPRESSION: 1. Normal head CT. 2. ASPECTS is 10. 3. These results were communicated to Dr. Donnetta Simpers at 1:45 am on 10/14/2020 by telephone. Electronically Signed   By: Ulyses Jarred M.D.   On: 10/14/2020 01:46   VAS US CAROTID  Result Date: 10/15/2020 Carotid Arterial Duplex Study Indications:       Speech disturbance and Confusion, facial droop. Other Factors:     Covid-19, atrial fibrillation, CHF,. Limitations        Today's exam was limited due to Rapid atrial  fibrillation and                    rapid, heavy breathing. Comparison Study:  No prior study Performing Technologist: Sharion Dove RVS  Examination Guidelines: A complete evaluation includes B-mode imaging, spectral Doppler, color Doppler, and power Doppler as needed of all accessible portions of each vessel. Bilateral testing is considered an integral part of a complete examination. Limited examinations for reoccurring indications may be performed as noted.  Right Carotid Findings: +----------+--------+--------+--------+------------------+------------------+           PSV cm/sEDV cm/sStenosisPlaque DescriptionComments           +----------+--------+--------+--------+------------------+------------------+ CCA Prox  104     38                                intimal thickening +----------+--------+--------+--------+------------------+------------------+ CCA Distal88      22                                intimal thickening +----------+--------+--------+--------+------------------+------------------+ ICA Prox  72      26                                                   +----------+--------+--------+--------+------------------+------------------+ ICA Distal93      35                                                   +----------+--------+--------+--------+------------------+------------------+  ECA       67      14                                                   +----------+--------+--------+--------+------------------+------------------+ +----------+--------+-------+--------+-------------------+           PSV cm/sEDV cmsDescribeArm Pressure (mmHG) +----------+--------+-------+--------+-------------------+ Subclavian171                                        +----------+--------+-------+--------+-------------------+ +---------+--------+--+--------+--+ VertebralPSV cm/s77EDV cm/s19 +---------+--------+--+--------+--+  Left Carotid Findings:  +----------+--------+--------+--------+------------------+------------------+           PSV cm/sEDV cm/sStenosisPlaque DescriptionComments           +----------+--------+--------+--------+------------------+------------------+ CCA Prox  132     48                                intimal thickening +----------+--------+--------+--------+------------------+------------------+ CCA Distal175     43                                intimal thickening +----------+--------+--------+--------+------------------+------------------+ ICA Prox  119     33                                                   +----------+--------+--------+--------+------------------+------------------+ ICA Distal109     42                                                   +----------+--------+--------+--------+------------------+------------------+ ECA       108     10                                                   +----------+--------+--------+--------+------------------+------------------+ +----------+--------+--------+--------+-------------------+           PSV cm/sEDV cm/sDescribeArm Pressure (mmHG) +----------+--------+--------+--------+-------------------+ Subclavian212                                         +----------+--------+--------+--------+-------------------+ +---------+--------+---+--------+--+ VertebralPSV cm/s101EDV cm/s30 +---------+--------+---+--------+--+   Summary: Right Carotid: The extracranial vessels were near-normal with only minimal wall                thickening or plaque. Left Carotid: The extracranial vessels were near-normal with only minimal wall               thickening or plaque. Vertebrals:  Bilateral vertebral arteries demonstrate antegrade flow. Subclavians: Normal flow hemodynamics were seen in bilateral subclavian              arteries. *See table(s) above for measurements and observations.  Electronically signed by Antony Contras MD on 10/15/2020 at  2:20:13 PM.  Final

## 2020-10-16 NOTE — Consult Note (Signed)
Referring Provider: [facility] Primary Care Physician:  Lajean Manes, MD Primary Gastroenterologist:  Cory Roughen PCP)  Reason for Consultation:  Pancreatitis  HPI: Raymond Dirr. is a 87 y.o. male with past medical history for metastatic melanoma with pancreatic head mass, A fib (on Eliquis),CHF (EF 45% as of 08/2019), currently hospitalized for COVID-PNA and possible aspiration PNA presenting for consultation of pancreatitis.  Patient denies any abdominal pain, nausea, vomiting, diarrhea, or other gastrointestinal symptoms.  He has a history of chronic constipation for which he takes stool softeners.    Denies prior episodes of pancreatitis, though notes prior history of metastatic melanoma to the pancrease.  Patient was treated with pembrolizumab.  Denies frequent or regular alcohol use, stating he drinks "very little."  Last CT at [facility] in 07/2020 showed unchanged punctate calcification in the pancreatic head.    Past Medical History:  Diagnosis Date  . Atrial fibrillation ([place])   . Melanoma ([place])   . Vitamin B 12 deficiency     Past Surgical History:  Procedure Laterality Date  . CARDIOVERSION N/A 06/02/2019   Procedure: CARDIOVERSION;  Surgeon: Jerline Pain, MD;  Location: [facility] ENDOSCOPY;  Service: Cardiovascular;  Laterality: N/A;  . RIGHT/LEFT HEART CATH AND CORONARY ANGIOGRAPHY N/A 04/27/2019   Procedure: RIGHT/LEFT HEART CATH AND CORONARY ANGIOGRAPHY;  Surgeon: Jolaine Artist, MD;  Location: [facility] CV LAB;  Service: Cardiovascular;  Laterality: N/A;  . TEE WITHOUT CARDIOVERSION N/A 06/02/2019   Procedure: TRANSESOPHAGEAL ECHOCARDIOGRAM (TEE);  Surgeon: Jerline Pain, MD;  Location: [facility] ENDOSCOPY;  Service: Cardiovascular;  Laterality: N/A;    Prior to Admission medications   Medication Sig Start Date End Date Taking? Authorizing Provider  acetaminophen (TYLENOL) 500 MG tablet Take 1,000 mg by mouth every 6 (six) hours as needed for mild pain or headache.    Yes [provider]  ELIQUIS 5 MG TABS tablet TAKE ONE TABLET TWICE DAILY Patient taking differently: Take 5 mg by mouth 2 (two) times daily. 08/17/20  Yes Lelon Perla, MD  metoprolol succinate (TOPROL-XL) 25 MG 24 hr tablet Take 1 tablet (25 mg total) by mouth every evening. 05/22/20  Yes Lelon Perla, MD  sacubitril-valsartan (ENTRESTO) 24-26 MG Take 1 tablet by mouth 2 (two) times daily. 12/07/19  Yes Lelon Perla, MD  triamcinolone cream (KENALOG) 0.1 % Apply 1 application topically 2 (two) times daily as needed (itching, rash). 04/25/20  Yes [provider]  montelukast (SINGULAIR) 10 MG tablet Take 1 tablet by mouth once daily as directed Patient not taking: No sig reported 06/13/20   Kozlow, Donnamarie Poag, MD    Scheduled Meds: . albuterol  2 puff Inhalation Q6H  . vitamin C  500 mg Oral Daily  . Chlorhexidine Gluconate Cloth  6 each Topical Daily  . Gerhardt's butt cream   Topical TID  . methylPREDNISolone (SOLU-MEDROL) injection  60 mg Intravenous Q12H  . pantoprazole  40 mg Oral Daily  . Pedialyte  1,000 mL Oral Q4H  . QUEtiapine  25 mg Oral BID  . sodium chloride flush  3 mL Intravenous Q12H  . zinc sulfate  220 mg Oral Daily   Continuous Infusions: . sodium chloride 125 mL/hr at 10/16/20 1408  . ampicillin-sulbactam (UNASYN) IV 3 g (10/16/20 1011)  . calcium gluconate 4 g (10/16/20 1407)  . doxycycline (VIBRAMYCIN) IV 100 mg (10/16/20 1148)  . heparin 1,000 Units/hr (10/16/20 1350)   PRN Meds:.acetaminophen, chlorpheniramine-HYDROcodone, guaiFENesin-dextromethorphan, haloperidol lactate, loperamide, melatonin, polyethylene glycol, triamcinolone  Allergies as of 10/14/2020 - Review Complete 10/14/2020  Allergen Reaction Noted  . Povidone-iodine Swelling 04/09/2019  . Tape Swelling 04/09/2019  . Bee venom Rash 10/29/2017  . Covid-19 mrna vacc (moderna) Hives and Rash 10/14/2020    No family history on file.  Social History   Socioeconomic  History  . Marital status: Married    Spouse name: Not on file  . Number of children: Not on file  . Years of education: Not on file  . Highest education level: Not on file  Occupational History  . Not on file  Tobacco Use  . Smoking status: Never Smoker  . Smokeless tobacco: Never Used  Vaping Use  . Vaping Use: Never used  Substance and Sexual Activity  . Alcohol use: Yes    Alcohol/week: 7.0 standard drinks    Types: 7 Glasses of wine per week  . Drug use: Never  . Sexual activity: Not on file  Other Topics Concern  . Not on file  Social History Narrative  . Not on file   Social Determinants of Health   Financial Resource Strain: Not on file  Food Insecurity: Not on file  Transportation Needs: Not on file  Physical Activity: Not on file  Stress: Not on file  Social Connections: Not on file  Intimate Partner Violence: Not on file    Review of Systems: Review of Systems  Constitutional: Positive for malaise/fatigue. Negative for weight loss.  HENT: Negative for sore throat.   Eyes: Negative for pain and redness.  Respiratory: Positive for shortness of breath. Negative for cough and stridor.   Cardiovascular: Negative for chest pain and palpitations.  Gastrointestinal: Positive for constipation. Negative for abdominal pain, blood in stool, diarrhea, heartburn, melena, nausea and vomiting.  Genitourinary: Negative for dysuria and flank pain.  Musculoskeletal: Negative for falls and joint pain.  Skin: Negative for itching and rash.  Neurological: Negative for loss of consciousness and weakness.  Endo/Heme/Allergies: Negative for polydipsia. Does not bruise/bleed easily.  Psychiatric/Behavioral: Negative for substance abuse. The patient is not nervous/anxious.     Physical Exam: Vital signs: Vitals:   10/16/20 0750 10/16/20 1239  BP: 111/85 (!) 126/52  Pulse: 97 (!) 111  Resp: 20 20  Temp: 98.1 F (36.7 C) 98.4 F (36.9 C)  SpO2: 96% 95%   Last BM Date:  10/15/20 Physical Exam Vitals reviewed.  Constitutional:      General: He is not in acute distress. HENT:     Head: Normocephalic and atraumatic.     Nose: Nose normal. No congestion.     Mouth/Throat:     Mouth: Mucous membranes are moist.     Pharynx: Oropharynx is clear.  Eyes:     General: No scleral icterus.    Extraocular Movements: Extraocular movements intact.     Conjunctiva/sclera: Conjunctivae normal.  Cardiovascular:     Rate and Rhythm: Regular rhythm. Tachycardia present.     Pulses: Normal pulses.  Pulmonary:     Effort: Pulmonary effort is normal. No respiratory distress.  Abdominal:     General: Bowel sounds are normal. There is no distension.     Palpations: Abdomen is soft. There is no mass.     Tenderness: There is no abdominal tenderness. There is no guarding or rebound.     Hernia: No hernia is present.  Musculoskeletal:        General: No swelling or tenderness.     Cervical back: Normal range of motion and neck supple.  Skin:    General: Skin is warm and dry.  Neurological:     General: No focal deficit present.     Mental Status: He is alert and oriented to person, place, and time.  Psychiatric:        Mood and Affect: Mood normal.        Behavior: Behavior normal.     GI:  Lab Results: Recent Labs    10/14/20 0123 10/14/20 0129 10/15/20 0445 10/16/20 0949  WBC 30.6*  --  42.7* 50.7*  HGB 12.5* 13.6 11.2* 10.9*  HCT 36.9* 40.0 34.3* 31.9*  PLT 162  --  157 170   BMET Recent Labs    10/14/20 0909 10/15/20 0445 10/16/20 0949  NA 129* 131* 130*  K 3.4* 3.4* 4.0  CL 97* 101 98  CO2 19* 13* 10*  GLUCOSE 103* 107* 111*  BUN 56* 72* 98*  CREATININE 5.08* 6.34* 7.98*  CALCIUM 4.5* 5.1* 5.3*   LFT Recent Labs    10/16/20 0949  PROT 5.5*  ALBUMIN 2.5*  AST 54*  ALT 43  ALKPHOS 66  BILITOT 0.5   PT/INR Recent Labs    10/14/20 0123  LABPROT 16.5*  INR 1.4*     Studies/Results: CT ABDOMEN PELVIS WO CONTRAST  Result  Date: 10/16/2020 CLINICAL DATA:  Acute generalized abdominal pain. EXAM: CT ABDOMEN AND PELVIS WITHOUT CONTRAST TECHNIQUE: Multidetector CT imaging of the abdomen and pelvis was performed following the standard protocol without IV contrast. COMPARISON:  None. FINDINGS: Lower chest: Mild bilateral pleural effusions are noted with adjacent subsegmental atelectasis. Hepatobiliary: Solitary gallstone is noted. No biliary dilatation is noted. Mild amount of fluid is noted around the liver. No focal abnormality is noted in the liver. Pancreas: Large amount of inflammatory changes are noted involving the pancreas consistent with severe acute pancreatitis. Fluid is seen extending into both pericolic gutters. No definite pseudocyst formation is seen at this time. Spleen: Mild amount of fluid is noted around the spleen. Adrenals/Urinary Tract: Adrenal glands appear normal. Left renal cyst is noted. No hydronephrosis or renal obstruction is noted. No renal or ureteral calculi are noted. Urinary bladder is decompressed secondary to Foley catheter. Stomach/Bowel: Stomach appears normal. There is no evidence of bowel obstruction. Sigmoid diverticulosis is noted without evidence of diverticulitis. Vascular/Lymphatic: Aortic atherosclerosis. No enlarged abdominal or pelvic lymph nodes. Reproductive: Prostate is unremarkable. Other: No abdominal wall hernia or abnormality. No abdominopelvic ascites. Musculoskeletal: No acute or significant osseous findings. IMPRESSION: 1. Large amount of inflammatory changes are noted involving the pancreas consistent with severe acute pancreatitis. Fluid is seen extending into both pericolic gutters as well as around the liver and spleen. No definite pseudocyst formation is seen at this time. 2. Solitary gallstone is noted. 3. Sigmoid diverticulosis without evidence of diverticulitis. 4. Mild bilateral pleural effusions are noted with adjacent subsegmental atelectasis. 5. Aortic atherosclerosis.  Aortic Atherosclerosis (ICD10-I70.0). Electronically Signed   By: Marijo Conception M.D.   On: 10/16/2020 13:28   DG Abd 1 View  Result Date: 10/15/2020 CLINICAL DATA:  Constipation. EXAM: ABDOMEN - 1 VIEW COMPARISON:  10/14/2020 FINDINGS: Mild gaseous distention of the colon is likely consistent with a component of ileus. No significant retained stool. No small bowel dilatation. IMPRESSION: Probable mild colonic ileus. Electronically Signed   By: Aletta Edouard M.D.   On: 10/15/2020 09:37   MR ANGIO HEAD WO CONTRAST  Result Date: 10/14/2020 CLINICAL DATA:  TIA. EXAM: MRI HEAD WITHOUT CONTRAST MRA HEAD WITHOUT CONTRAST TECHNIQUE:  Multiplanar, multiecho pulse sequences of the brain and surrounding structures were obtained without intravenous contrast. Angiographic images of the head were obtained using MRA technique without contrast. COMPARISON:  CT head 10/14/2020 FINDINGS: MRI HEAD FINDINGS Brain: Negative for acute infarct. Small area of T2 shine through in the left frontal white matter. No areas of restricted diffusion. Mild white matter changes with scattered small periventricular deep white matter hyperintensities bilaterally. Brainstem normal. Negative for hemorrhage or mass. Negative for hydrocephalus. Cerebral volume normal. Vascular: Normal arterial flow voids Skull and upper cervical spine: No focal skeletal abnormality. Sinuses/Orbits: Mild mucosal edema paranasal sinuses and right mastoid sinus. Normal orbit Other: None MRA HEAD FINDINGS Both vertebral arteries are widely patent and normal. Basilar widely patent. AICA, PICA widely patent. Superior cerebellar and posterior cerebral arteries normal bilaterally. Internal carotid artery widely patent bilaterally. Anterior and middle cerebral arteries normal bilaterally. Negative for cerebral aneurysm. IMPRESSION: 1. Negative for acute infarct. Mild chronic microvascular ischemic change in the white matter. 2. Negative MRA head Electronically Signed    By: Franchot Gallo M.D.   On: 10/14/2020 16:24   MR BRAIN WO CONTRAST  Result Date: 10/14/2020 CLINICAL DATA:  TIA. EXAM: MRI HEAD WITHOUT CONTRAST MRA HEAD WITHOUT CONTRAST TECHNIQUE: Multiplanar, multiecho pulse sequences of the brain and surrounding structures were obtained without intravenous contrast. Angiographic images of the head were obtained using MRA technique without contrast. COMPARISON:  CT head 10/14/2020 FINDINGS: MRI HEAD FINDINGS Brain: Negative for acute infarct. Small area of T2 shine through in the left frontal white matter. No areas of restricted diffusion. Mild white matter changes with scattered small periventricular deep white matter hyperintensities bilaterally. Brainstem normal. Negative for hemorrhage or mass. Negative for hydrocephalus. Cerebral volume normal. Vascular: Normal arterial flow voids Skull and upper cervical spine: No focal skeletal abnormality. Sinuses/Orbits: Mild mucosal edema paranasal sinuses and right mastoid sinus. Normal orbit Other: None MRA HEAD FINDINGS Both vertebral arteries are widely patent and normal. Basilar widely patent. AICA, PICA widely patent. Superior cerebellar and posterior cerebral arteries normal bilaterally. Internal carotid artery widely patent bilaterally. Anterior and middle cerebral arteries normal bilaterally. Negative for cerebral aneurysm. IMPRESSION: 1. Negative for acute infarct. Mild chronic microvascular ischemic change in the white matter. 2. Negative MRA head Electronically Signed   By: Franchot Gallo M.D.   On: 10/14/2020 16:24   US RENAL  Result Date: 10/15/2020 CLINICAL DATA:  Acute kidney injury EXAM: RENAL / URINARY TRACT ULTRASOUND COMPLETE COMPARISON:  Radiograph 10/15/2020 FINDINGS: Right Kidney: Renal measurements: 12.2 x 5.6 x 5 cm = volume: 180.6 mL. Echogenicity within normal limits. No mass or hydronephrosis visualized. Left Kidney: Renal measurements: 11.9 x 6.2 x 4.2 cm = volume: 162.9 mL. Echogenicity within  normal limits. No hydronephrosis. Exophytic cyst off the upper pole measuring 3 cm. Probable small parapelvic cyst within the mid to upper pole measuring 2 cm. Bladder: Decompressed which limits evaluation Other: Small free fluid in the right upper quadrant. Small pleural effusion. IMPRESSION: 1. No hydronephrosis. 2. Cysts within the left kidney 3. Small amount of free fluid in the abdomen. Right pleural effusion. Electronically Signed   By: Donavan Foil M.D.   On: 10/15/2020 18:47   DG Chest Port 1 View  Result Date: 10/16/2020 CLINICAL DATA:  Shortness of breath, COVID EXAM: PORTABLE CHEST 1 VIEW COMPARISON:  10/15/2020 FINDINGS: No significant change in AP portable chest radiograph, likely with small bilateral layering pleural effusions. No new or focal airspace opacity. Heart and mediastinum are IMPRESSION: No  significant change in AP portable chest radiograph, likely with small bilateral layering pleural effusions. No new or focal airspace opacity. Electronically Signed   By: Eddie Candle M.D.   On: 10/16/2020 09:00   DG Chest Port 1 View  Result Date: 10/15/2020 CLINICAL DATA:  COVID positive, dyspnea EXAM: PORTABLE CHEST 1 VIEW COMPARISON:  Chest radiograph from one day prior. FINDINGS: Stable cardiomediastinal silhouette with top-normal heart size. No pneumothorax. Small left pleural effusion, stable. No right pleural effusion. No pulmonary edema. Left retrocardiac opacity with increased volume loss. IMPRESSION: 1. Stable small left pleural effusion. 2. Left retrocardiac opacity with increased volume loss, indicating left lower lobe atelectasis with possible component of pneumonia. Electronically Signed   By: Ilona Sorrel M.D.   On: 10/15/2020 07:53   VAS US CAROTID  Result Date: 10/15/2020 Carotid Arterial Duplex Study Indications:       Speech disturbance and Confusion, facial droop. Other Factors:     Covid-19, atrial fibrillation, CHF,. Limitations        Today's exam was limited due to Rapid  atrial fibrillation and                    rapid, heavy breathing. Comparison Study:  No prior study Performing Technologist: Sharion Dove RVS  Examination Guidelines: A complete evaluation includes B-mode imaging, spectral Doppler, color Doppler, and power Doppler as needed of all accessible portions of each vessel. Bilateral testing is considered an integral part of a complete examination. Limited examinations for reoccurring indications may be performed as noted.  Right Carotid Findings: +----------+--------+--------+--------+------------------+------------------+           PSV cm/sEDV cm/sStenosisPlaque DescriptionComments           +----------+--------+--------+--------+------------------+------------------+ CCA Prox  104     38                                intimal thickening +----------+--------+--------+--------+------------------+------------------+ CCA Distal88      22                                intimal thickening +----------+--------+--------+--------+------------------+------------------+ ICA Prox  72      26                                                   +----------+--------+--------+--------+------------------+------------------+ ICA Distal93      35                                                   +----------+--------+--------+--------+------------------+------------------+ ECA       67      14                                                   +----------+--------+--------+--------+------------------+------------------+ +----------+--------+-------+--------+-------------------+           PSV cm/sEDV cmsDescribeArm Pressure (mmHG) +----------+--------+-------+--------+-------------------+ Subclavian171                                        +----------+--------+-------+--------+-------------------+ +---------+--------+--+--------+--+  VertebralPSV cm/s77EDV cm/s19 +---------+--------+--+--------+--+  Left Carotid Findings:  +----------+--------+--------+--------+------------------+------------------+           PSV cm/sEDV cm/sStenosisPlaque DescriptionComments           +----------+--------+--------+--------+------------------+------------------+ CCA Prox  132     48                                intimal thickening +----------+--------+--------+--------+------------------+------------------+ CCA Distal175     43                                intimal thickening +----------+--------+--------+--------+------------------+------------------+ ICA Prox  119     33                                                   +----------+--------+--------+--------+------------------+------------------+ ICA Distal109     42                                                   +----------+--------+--------+--------+------------------+------------------+ ECA       108     10                                                   +----------+--------+--------+--------+------------------+------------------+ +----------+--------+--------+--------+-------------------+           PSV cm/sEDV cm/sDescribeArm Pressure (mmHG) +----------+--------+--------+--------+-------------------+ Subclavian212                                         +----------+--------+--------+--------+-------------------+ +---------+--------+---+--------+--+ VertebralPSV cm/s101EDV cm/s30 +---------+--------+---+--------+--+   Summary: Right Carotid: The extracranial vessels were near-normal with only minimal wall                thickening or plaque. Left Carotid: The extracranial vessels were near-normal with only minimal wall               thickening or plaque. Vertebrals:  Bilateral vertebral arteries demonstrate antegrade flow. Subclavians: Normal flow hemodynamics were seen in bilateral subclavian              arteries. *See table(s) above for measurements and observations.  Electronically signed by Antony Contras MD on 10/15/2020 at  2:20:13 PM.    Final     Impression: Abnormal CT of the pancreas: Large amount of inflammatory changes are noted involving the pancreas consistent with severe acute pancreatitis. Fluid is seen extending into both pericolic gutters as well as around the liver and spleen. No definite pseudocyst formation is seen at this time. -Surprisingly, patient's lipase is normal and he has no abdominal pain. ? Melanoma-related  -Normal lipid panel -One gallstone on CT imaging.  Normal LFTs  AKI: BUN 98/Cr 7.98  COVID-19 PNA  A fib (on Eliquis)  CHF (EF 45% as of 08/2019)  Plan: Increase IVF to 150 cc/h, given poor renal function, as long as patient tolerates increased fluids from a cardiac perspective (currently receiving 125  cc/h)  Clear liquid diet OK from GI standpoint.  [facility] will follow at a distance.   LOS: 2 days   Salley Slaughter  PA-C 10/16/2020, 2:39 PM  Contact #  [phone]

## 2020-10-16 NOTE — Progress Notes (Signed)
[facility] Progress Note    Assessment/ Plan:   1. Acute renal failure - baseline cr 11.22 March 2020, now with severe AKI in the setting of multiple insults including COVID, diarrhea, and pancreatitis.  Cr up to 7.98 today, still making some urine but with continued hypocalcemia and metabolic acidosis.  Discussed with pt and with PCCM- will need RRT in the next 24 hours or so if no marked improvement.  Contineu IVFs, change fluids to bicarb gtt.  Pt is in agreement with the plan.  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   2. Acute pancreatitis: Noted on CT scan today at 1300.  GI has been consulted, he is NPO now, appreciate assistance.  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  Had a hospitalization at [facility] for pancreatitis 2016 when peripancreatic lymph node leading to dx first discovered. Lipase and lipid panel pending.  3. Hypocalcemia: likely related to #2- replete aggressively  4.  Diarrhea: likely  Due to #2 +/- COVID infection  5. Acute hypoxemic RF: likely aspiration PNA +/- COVID pneumonitis, getting Unasyn and doxycycline along with supportive care  6. COVID-19 infection: vaccinated and boosted- getting steroids  7. Acute encephalopathy: multifactorial, likely COVID +/- BUN in the 90s  8. Dispo: pending- very tenuous status    Subjective:    WBC ct up to 50.7, CO2 down to 10, Cr up to 7.98, Ca still in the 5s, getting repleted.  CT abd/ pelvis with acute pancreatitis.  Despite all this, he reports he is pain-free and is actually getting up and walking with PT on my eval today.  Only 50 mL urine charted today but there is more in his Foley bag   Objective:   BP (!) 126/52 (BP Location: Right Arm)   Pulse (!) 111   Temp 98.4 F (36.9  C) (Oral)   Resp 20   Ht 5\' 8"  (1.727 m)   Wt 95.1 kg   SpO2 95%   BMI 31.88 kg/m   Intake/Output Summary (Last 24 hours) at 10/16/2020 1602 Last data filed at 10/16/2020 0700 Gross per 24 hour  Intake 364.25 ml  Output 50 ml  Net 314.25 ml   Weight change:   Physical Exam: Gen: older gentleman, NAD CVS: tachycardic Resp: tachypnea that is better with rest Abd: somewhat distended, nontender to palpation Ext: trace LE edema NEURO: some confusion, no frank asterixis  Imaging: CT ABDOMEN PELVIS WO CONTRAST  Result Date: 10/16/2020 CLINICAL DATA:  Acute generalized abdominal pain. EXAM: CT ABDOMEN AND PELVIS WITHOUT CONTRAST TECHNIQUE: Multidetector CT imaging of the abdomen and pelvis was performed following the standard protocol without IV contrast. COMPARISON:  None. FINDINGS: Lower chest: Mild bilateral pleural effusions are noted with adjacent subsegmental atelectasis. Hepatobiliary: Solitary gallstone is noted. No biliary dilatation is noted. Mild amount of fluid is noted around the liver. No focal abnormality is noted in the liver. Pancreas: Large amount of inflammatory changes are noted involving the pancreas consistent with severe acute pancreatitis. Fluid is seen extending into both pericolic gutters. No definite pseudocyst formation is seen at this time. Spleen: Mild amount of fluid is noted around the spleen. Adrenals/Urinary Tract: Adrenal glands appear normal. Left renal cyst is noted. No hydronephrosis or renal obstruction is noted. No renal or  ureteral calculi are noted. Urinary bladder is decompressed secondary to Foley catheter. Stomach/Bowel: Stomach appears normal. There is no evidence of bowel obstruction. Sigmoid diverticulosis is noted without evidence of diverticulitis. Vascular/Lymphatic: Aortic atherosclerosis. No enlarged abdominal or pelvic lymph nodes. Reproductive: Prostate is unremarkable. Other: No abdominal wall hernia or abnormality. No abdominopelvic ascites.  Musculoskeletal: No acute or significant osseous findings. IMPRESSION: 1. Large amount of inflammatory changes are noted involving the pancreas consistent with severe acute pancreatitis. Fluid is seen extending into both pericolic gutters as well as around the liver and spleen. No definite pseudocyst formation is seen at this time. 2. Solitary gallstone is noted. 3. Sigmoid diverticulosis without evidence of diverticulitis. 4. Mild bilateral pleural effusions are noted with adjacent subsegmental atelectasis. 5. Aortic atherosclerosis. Aortic Atherosclerosis (ICD10-I70.0). Electronically Signed   By: Marijo Conception M.D.   On: 10/16/2020 13:28   DG Abd 1 View  Result Date: 10/15/2020 CLINICAL DATA:  Constipation. EXAM: ABDOMEN - 1 VIEW COMPARISON:  10/14/2020 FINDINGS: Mild gaseous distention of the colon is likely consistent with a component of ileus. No significant retained stool. No small bowel dilatation. IMPRESSION: Probable mild colonic ileus. Electronically Signed   By: Aletta Edouard M.D.   On: 10/15/2020 09:37   US RENAL  Result Date: 10/15/2020 CLINICAL DATA:  Acute kidney injury EXAM: RENAL / URINARY TRACT ULTRASOUND COMPLETE COMPARISON:  Radiograph 10/15/2020 FINDINGS: Right Kidney: Renal measurements: 12.2 x 5.6 x 5 cm = volume: 180.6 mL. Echogenicity within normal limits. No mass or hydronephrosis visualized. Left Kidney: Renal measurements: 11.9 x 6.2 x 4.2 cm = volume: 162.9 mL. Echogenicity within normal limits. No hydronephrosis. Exophytic cyst off the upper pole measuring 3 cm. Probable small parapelvic cyst within the mid to upper pole measuring 2 cm. Bladder: Decompressed which limits evaluation Other: Small free fluid in the right upper quadrant. Small pleural effusion. IMPRESSION: 1. No hydronephrosis. 2. Cysts within the left kidney 3. Small amount of free fluid in the abdomen. Right pleural effusion. Electronically Signed   By: Donavan Foil M.D.   On: 10/15/2020 18:47   DG Chest  Port 1 View  Result Date: 10/16/2020 CLINICAL DATA:  Shortness of breath, COVID EXAM: PORTABLE CHEST 1 VIEW COMPARISON:  10/15/2020 FINDINGS: No significant change in AP portable chest radiograph, likely with small bilateral layering pleural effusions. No new or focal airspace opacity. Heart and mediastinum are IMPRESSION: No significant change in AP portable chest radiograph, likely with small bilateral layering pleural effusions. No new or focal airspace opacity. Electronically Signed   By: Eddie Candle M.D.   On: 10/16/2020 09:00   DG Chest Port 1 View  Result Date: 10/15/2020 CLINICAL DATA:  COVID positive, dyspnea EXAM: PORTABLE CHEST 1 VIEW COMPARISON:  Chest radiograph from one day prior. FINDINGS: Stable cardiomediastinal silhouette with top-normal heart size. No pneumothorax. Small left pleural effusion, stable. No right pleural effusion. No pulmonary edema. Left retrocardiac opacity with increased volume loss. IMPRESSION: 1. Stable small left pleural effusion. 2. Left retrocardiac opacity with increased volume loss, indicating left lower lobe atelectasis with possible component of pneumonia. Electronically Signed   By: Ilona Sorrel M.D.   On: 10/15/2020 07:53   VAS US CAROTID  Result Date: 10/15/2020 Carotid Arterial Duplex Study Indications:       Speech disturbance and Confusion, facial droop. Other Factors:     Covid-19, atrial fibrillation, CHF,. Limitations        Today's exam was limited due to Rapid atrial fibrillation and  rapid, heavy breathing. Comparison Study:  No prior study Performing Technologist: Sharion Dove RVS  Examination Guidelines: A complete evaluation includes B-mode imaging, spectral Doppler, color Doppler, and power Doppler as needed of all accessible portions of each vessel. Bilateral testing is considered an integral part of a complete examination. Limited examinations for reoccurring indications may be performed as noted.  Right Carotid Findings:  +----------+--------+--------+--------+------------------+------------------+           PSV cm/sEDV cm/sStenosisPlaque DescriptionComments           +----------+--------+--------+--------+------------------+------------------+ CCA Prox  104     38                                intimal thickening +----------+--------+--------+--------+------------------+------------------+ CCA Distal88      22                                intimal thickening +----------+--------+--------+--------+------------------+------------------+ ICA Prox  72      26                                                   +----------+--------+--------+--------+------------------+------------------+ ICA Distal93      35                                                   +----------+--------+--------+--------+------------------+------------------+ ECA       67      14                                                   +----------+--------+--------+--------+------------------+------------------+ +----------+--------+-------+--------+-------------------+           PSV cm/sEDV cmsDescribeArm Pressure (mmHG) +----------+--------+-------+--------+-------------------+ Subclavian171                                        +----------+--------+-------+--------+-------------------+ +---------+--------+--+--------+--+ VertebralPSV cm/s77EDV cm/s19 +---------+--------+--+--------+--+  Left Carotid Findings: +----------+--------+--------+--------+------------------+------------------+           PSV cm/sEDV cm/sStenosisPlaque DescriptionComments           +----------+--------+--------+--------+------------------+------------------+ CCA Prox  132     48                                intimal thickening +----------+--------+--------+--------+------------------+------------------+ CCA Distal175     43                                intimal thickening  +----------+--------+--------+--------+------------------+------------------+ ICA Prox  119     33                                                   +----------+--------+--------+--------+------------------+------------------+ ICA  Distal109     42                                                   +----------+--------+--------+--------+------------------+------------------+ ECA       108     10                                                   +----------+--------+--------+--------+------------------+------------------+ +----------+--------+--------+--------+-------------------+           PSV cm/sEDV cm/sDescribeArm Pressure (mmHG) +----------+--------+--------+--------+-------------------+ Subclavian212                                         +----------+--------+--------+--------+-------------------+ +---------+--------+---+--------+--+ VertebralPSV cm/s101EDV cm/s30 +---------+--------+---+--------+--+   Summary: Right Carotid: The extracranial vessels were near-normal with only minimal wall                thickening or plaque. Left Carotid: The extracranial vessels were near-normal with only minimal wall               thickening or plaque. Vertebrals:  Bilateral vertebral arteries demonstrate antegrade flow. Subclavians: Normal flow hemodynamics were seen in bilateral subclavian              arteries. *See table(s) above for measurements and observations.  Electronically signed by Antony Contras MD on 10/15/2020 at 2:20:13 PM.    Final     Labs: BMET Recent Labs  Lab 10/14/20 0123 10/14/20 0129 10/14/20 0909 10/15/20 0445 10/16/20 0949  NA 130* 130* 129* 131* 130*  K 3.8 3.8 3.4* 3.4* 4.0  CL 96* 96* 97* 101 98  CO2 18*  --  19* 13* 10*  GLUCOSE 145* 138* 103* 107* 111*  BUN 57* 59* 56* 72* 98*  CREATININE 5.89* 5.60* 5.08* 6.34* 7.98*  CALCIUM 4.5*  --  4.5* 5.1* 5.3*  PHOS  --   --   --  6.1*  --    CBC Recent Labs  Lab 10/14/20 0123 10/14/20 0129  10/15/20 0445 10/16/20 0949  WBC 30.6*  --  42.7* 50.7*  NEUTROABS 19.2*  --  30.5* 39.1*  HGB 12.5* 13.6 11.2* 10.9*  HCT 36.9* 40.0 34.3* 31.9*  MCV 107.9*  --  107.9* 105.3*  PLT 162  --  157 170    Medications:    . albuterol  2 puff Inhalation Q6H  . vitamin C  500 mg Oral Daily  . Chlorhexidine Gluconate Cloth  6 each Topical Daily  . Gerhardt's butt cream   Topical TID  . methylPREDNISolone (SOLU-MEDROL) injection  60 mg Intravenous Q12H  . pantoprazole  40 mg Oral Daily  . Pedialyte  1,000 mL Oral Q4H  . QUEtiapine  25 mg Oral BID  . sodium chloride flush  3 mL Intravenous Q12H  . zinc sulfate  220 mg Oral Daily      Madelon Lips, MD 10/16/2020, 4:02 PM

## 2020-10-16 NOTE — Progress Notes (Signed)
MD and pharmacy notified of abnormal labs. Orders received from Pharmacy Heparin is on hold for one hour.

## 2020-10-16 NOTE — Progress Notes (Signed)
[place] for Heparin Indication: atrial fibrillation  Allergies  Allergen Reactions  . Povidone-Iodine Swelling  . Tape Swelling  . Bee Venom Rash  . Covid-19 Mrna Vacc (Moderna) Hives and Rash    Rash came after booster shot. Pt was fine with 1st and 2nd    Patient Measurements: Height: 5\' 8"  (172.7 cm) Weight: 95.1 kg (209 lb 10.5 oz) IBW/kg (Calculated) : 68.4 Heparin Dosing Weight: 88.5 kg  Vital Signs: Temp: 98.1 F (36.7 C) (01/03 0750) Temp Source: Oral (01/03 0750) BP: 111/85 (01/03 0750) Pulse Rate: 97 (01/03 0750)  Labs: Recent Labs    10/14/20 0123 10/14/20 0129 10/14/20 0144 10/14/20 0344 10/14/20 0909 10/15/20 0445 10/16/20 0949  HGB 12.5* 13.6  --   --   --  11.2* 10.9*  HCT 36.9* 40.0  --   --   --  34.3* 31.9*  PLT 162  --   --   --   --  157 170  APTT 40*  --   --   --   --   --  >200*  LABPROT 16.5*  --   --   --   --   --   --   INR 1.4*  --   --   --   --   --   --   CREATININE 5.89* 5.60*  --   --  5.08* 6.34* 7.98*  TROPONINIHS  --   --  50* 61*  --   --   --     Estimated Creatinine Clearance: 7.3 mL/min (A) (by C-G formula based on SCr of 7.98 mg/dL (H)).   Medical History: Past Medical History:  Diagnosis Date  . Atrial fibrillation ([place])   . Melanoma ([place])   . Vitamin B 12 deficiency     Medications:  Scheduled:  . albuterol  2 puff Inhalation Q6H  . vitamin C  500 mg Oral Daily  . Chlorhexidine Gluconate Cloth  6 each Topical Daily  . Gerhardt's butt cream   Topical TID  . methylPREDNISolone (SOLU-MEDROL) injection  60 mg Intravenous Q12H  . pantoprazole  40 mg Oral Daily  . Pedialyte  1,000 mL Oral Q4H  . QUEtiapine  25 mg Oral BID  . sodium chloride flush  3 mL Intravenous Q12H  . zinc sulfate  220 mg Oral Daily    Assessment: Patient is a 76 yom that is being admitted for COVID and AKI. Patient takes Apixaban pta for aflutter. Pharmacy has been asked to dose heparin at this  time due to concerns for taking oral meds.   Last dose of Apixaban was at 1030 on 10/15/20  APTT > 200  Goal of Therapy:  aPTT 66-102 seconds Monitor platelets by anticoagulation protocol: Yes   Plan:   - Hold heparin x 1 hour and restart heparin drip @ 1000 units/hr on 10/16/20 at 1200 - Will monitor heparin using aPTT until aPTT and Heparin levels correlate - aPTT level in ~ 8 hours  - Monitor patient for s/s of bleeding and CBC while on heparin   Alanda Slim, PharmD, [facility] Clinical Pharmacist Please see AMION for all Pharmacists' Contact Phone Numbers 10/16/2020, 11:10 AM

## 2020-10-16 NOTE — Evaluation (Signed)
Physical Therapy Evaluation Patient Details Name: Raymond Hurley. MRN: [id number] DOB: 04/04/1931 Today's Date: 10/16/2020   History of Present Illness  Pt is a 87 y.o. male with past medical history for metastatic melanoma with pancreatic head mass, A fib (on Eliquis), CHF (EF 45% as of 08/2019), currently hospitalized for COVID-PNA and possible aspiration PNA presenting for consultation of pancreatitis.  Clinical Impression  Pt admitted with/for COVID PNA, AKI/pancreatitis.  Pt self limited mobility today, but needed minimal assist for basic mobility and expect pt should progress well if gets mobilized in general.  Pt currently limited functionally due to the problems listed below.  (see problems list.)  Pt will benefit from PT to maximize function and safety to be able to get home safely with available assist.     Follow Up Recommendations Home health PT;Supervision/Assistance - 24 hour;Supervision - Intermittent    Equipment Recommendations  Rolling walker with 5" wheels;Other (comment) (May need no equipment except RW for energy cons.)    Recommendations for Other Services       Precautions / Restrictions Precautions Precautions: Fall;Other (comment) Precaution Comments: covid Restrictions Weight Bearing Restrictions: No      Mobility  Bed Mobility Overal bed mobility: Needs Assistance Bed Mobility: Sit to Supine;Supine to Sit     Supine to sit: Min assist Sit to supine: Min guard   General bed mobility comments: minA for trunk elevation; pt not willing to use bed rail with RUE, wanting therapist hand to elevate trunk    Transfers Overall transfer level: Needs assistance Equipment used: 2 person hand held assist Transfers: Sit to/from Stand Sit to Stand: Min assist;+2 physical assistance;+2 safety/equipment         General transfer comment: MinA +2 for stability; pt wanting to pull up with OT/PT rather that push from bed.  Ambulation/Gait              General Gait Details: pt declined  Financial trader Rankin (Stroke Patients Only)       Balance Overall balance assessment: Needs assistance Sitting-balance support: No upper extremity supported;Bilateral upper extremity supported;Feet supported Sitting balance-Leahy Scale: Poor Sitting balance - Comments: pt requiring BUEs for support due to SOB     Standing balance-Leahy Scale: Poor Standing balance comment: requiring assist for balance                             Pertinent Vitals/Pain Pain Assessment: No/denies pain Faces Pain Scale: No hurt Pain Intervention(s): Monitored during session    Home Living Family/patient expects to be discharged to:: Private residence Living Arrangements: Spouse/significant other Available Help at Discharge: Family;Available 24 hours/day Type of Home: House Home Access: Level entry (at front)     Home Layout: One level;Other (Comment);Laundry or work area in [place]: Shower seat;Grab bars - tub/shower Additional Comments: spouse is healthy; son works for family business    Prior Function Level of Independence: Independent with assistive device(s);Independent         Comments: driving; work full-time; Careers adviser; 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, newspaper   Dominant Hand: Right    Extremity/Trunk Assessment   Upper Extremity Assessment Upper Extremity Assessment: Generalized weakness    Lower Extremity Assessment Lower Extremity Assessment: Generalized weakness    Cervical / Trunk Assessment Cervical / Trunk Assessment: Kyphotic  Communication  Communication: HOH  Cognition Arousal/Alertness: Awake/alert Behavior During Therapy: Anxious Overall Cognitive Status: Impaired/Different from baseline Area of Impairment: Awareness;Problem solving;Memory;Safety/judgement                     Memory: Decreased short-term memory   Safety/Judgement:  Decreased awareness of deficits Awareness: Emergent Problem Solving: Slow processing;Difficulty sequencing;Requires verbal cues General Comments: A/O x4; pt remains anxious throughout session; pt not always following commands for hand placement      General Comments General comments (skin integrity, edema, etc.): SOB noted progressively over course of eval; SpO2 on RA 95% in bed;   SpO2  >88% on 3L [place]  with activity, At rest on RA  >96%.  HR 90's at rest, consistently 120's and up max 140's with activity.    Exercises     Assessment/Plan    PT Assessment Patient needs continued PT services  PT Problem List Decreased strength;Decreased activity tolerance;Decreased balance;Decreased mobility;Decreased cognition;Decreased safety awareness;Cardiopulmonary status limiting activity       PT Treatment Interventions DME instruction;Gait training;Functional mobility training;Therapeutic activities;Balance training;Patient/family education    PT Goals (Current goals can be found in the Care Plan section)  Acute Rehab PT Goals Patient Stated Goal: to go home PT Goal Formulation: With patient Time For Goal Achievement: 10/30/20 Potential to Achieve Goals: Good    Frequency Min 3X/week   Barriers to discharge        Co-evaluation PT/OT/SLP Co-Evaluation/Treatment: Yes Reason for Co-Treatment: Complexity of the patient's impairments (multi-system involvement) PT goals addressed during session: Mobility/safety with mobility OT goals addressed during session: ADL's and self-care       AM-PAC PT "6 Clicks" Mobility  Outcome Measure Help needed turning from your back to your side while in a flat bed without using bedrails?: A Little Help needed moving from lying on your back to sitting on the side of a flat bed without using bedrails?: A Little Help needed moving to and from a bed to a chair (including a wheelchair)?: A Little Help needed standing up from a chair using your arms (e.g.,  wheelchair or bedside chair)?: A Little Help needed to walk in hospital room?: A Little Help needed climbing 3-5 steps with a railing? : A Little 6 Click Score: 18    End of Session Equipment Utilized During Treatment: Oxygen Activity Tolerance: Patient tolerated treatment well;Patient limited by fatigue Patient left: in bed;with call bell/phone within reach Nurse Communication: Mobility status PT Visit Diagnosis: Other abnormalities of gait and mobility (R26.89);Difficulty in walking, not elsewhere classified (R26.2);Muscle weakness (generalized) (M62.81)    Time: 2423-5361 PT Time Calculation (min) (ACUTE ONLY): 27 min   Charges:   PT Evaluation $PT Eval Moderate Complexity: 1 Mod          10/16/2020  Ginger Carne., PT Acute Rehabilitation Services [phone]  (pager) [phone]  (office)  Tessie Fass Jacquelyn Shadrick 10/16/2020, 5:50 PM

## 2020-10-17 ENCOUNTER — Inpatient Hospital Stay (HOSPITAL_COMMUNITY): Payer: Medicare Other

## 2020-10-17 DIAGNOSIS — J9600 Acute respiratory failure, unspecified whether with hypoxia or hypercapnia: Secondary | ICD-10-CM

## 2020-10-17 DIAGNOSIS — A419 Sepsis, unspecified organism: Secondary | ICD-10-CM

## 2020-10-17 DIAGNOSIS — U071 COVID-19: Secondary | ICD-10-CM | POA: Diagnosis not present

## 2020-10-17 DIAGNOSIS — R6520 Severe sepsis without septic shock: Secondary | ICD-10-CM | POA: Diagnosis not present

## 2020-10-17 DIAGNOSIS — N179 Acute kidney failure, unspecified: Secondary | ICD-10-CM

## 2020-10-17 DIAGNOSIS — J1282 Pneumonia due to coronavirus disease 2019: Secondary | ICD-10-CM | POA: Diagnosis not present

## 2020-10-17 DIAGNOSIS — I5022 Chronic systolic (congestive) heart failure: Secondary | ICD-10-CM | POA: Diagnosis not present

## 2020-10-17 DIAGNOSIS — E860 Dehydration: Secondary | ICD-10-CM

## 2020-10-17 DIAGNOSIS — I4819 Other persistent atrial fibrillation: Secondary | ICD-10-CM

## 2020-10-17 LAB — COMPREHENSIVE METABOLIC PANEL
ALT: 40 U/L (ref 0–44)
AST: 50 U/L — ABNORMAL HIGH (ref 15–41)
Albumin: 2.3 g/dL — ABNORMAL LOW (ref 3.5–5.0)
Alkaline Phosphatase: 58 U/L (ref 38–126)
Anion gap: 18 — ABNORMAL HIGH (ref 5–15)
BUN: 108 mg/dL — ABNORMAL HIGH (ref 8–23)
CO2: 11 mmol/L — ABNORMAL LOW (ref 22–32)
Calcium: 5.4 mg/dL — CL (ref 8.9–10.3)
Chloride: 98 mmol/L (ref 98–111)
Creatinine, Ser: 8.62 mg/dL — ABNORMAL HIGH (ref 0.61–1.24)
GFR, Estimated: 5 mL/min — ABNORMAL LOW (ref >60)
Glucose, Bld: 173 mg/dL — ABNORMAL HIGH (ref 70–99)
Potassium: 3.8 mmol/L (ref 3.5–5.1)
Sodium: 127 mmol/L — ABNORMAL LOW (ref 135–145)
Total Bilirubin: 1.1 mg/dL (ref 0.3–1.2)
Total Protein: 4.9 g/dL — ABNORMAL LOW (ref 6.5–8.1)

## 2020-10-17 LAB — CBC WITH DIFFERENTIAL/PLATELET
Abs Immature Granulocytes: 4.81 10*3/uL — ABNORMAL HIGH (ref 0.00–0.07)
Basophils Absolute: 0 10*3/uL (ref 0.0–0.1)
Basophils Relative: 0 %
Eosinophils Absolute: 0.1 10*3/uL (ref 0.0–0.5)
Eosinophils Relative: 0 %
HCT: 26.4 % — ABNORMAL LOW (ref 39.0–52.0)
Hemoglobin: 9.2 g/dL — ABNORMAL LOW (ref 13.0–17.0)
Immature Granulocytes: 10 %
Lymphocytes Relative: 1 %
Lymphs Abs: 0.7 10*3/uL (ref 0.7–4.0)
MCH: 36.4 pg — ABNORMAL HIGH (ref 26.0–34.0)
MCHC: 34.8 g/dL (ref 30.0–36.0)
MCV: 104.3 fL — ABNORMAL HIGH (ref 80.0–100.0)
Monocytes Absolute: 6.2 10*3/uL — ABNORMAL HIGH (ref 0.1–1.0)
Monocytes Relative: 13 %
Neutro Abs: 34.6 10*3/uL — ABNORMAL HIGH (ref 1.7–7.7)
Neutrophils Relative %: 76 %
Platelets: 151 10*3/uL (ref 150–400)
RBC: 2.53 MIL/uL — ABNORMAL LOW (ref 4.22–5.81)
RDW: 15 % (ref 11.5–15.5)
WBC: 46.4 10*3/uL — ABNORMAL HIGH (ref 4.0–10.5)
nRBC: 0.1 % (ref 0.0–0.2)

## 2020-10-17 LAB — GLUCOSE, CAPILLARY
Glucose-Capillary: 132 mg/dL — ABNORMAL HIGH (ref 70–99)
Glucose-Capillary: 167 mg/dL — ABNORMAL HIGH (ref 70–99)
Glucose-Capillary: 183 mg/dL — ABNORMAL HIGH (ref 70–99)
Glucose-Capillary: 193 mg/dL — ABNORMAL HIGH (ref 70–99)

## 2020-10-17 LAB — BRAIN NATRIURETIC PEPTIDE: B Natriuretic Peptide: 262 pg/mL — ABNORMAL HIGH (ref 0.0–100.0)

## 2020-10-17 LAB — HEPARIN LEVEL (UNFRACTIONATED)
Heparin Unfractionated: 2.06 IU/mL — ABNORMAL HIGH (ref 0.30–0.70)
Heparin Unfractionated: 1.58 IU/mL — ABNORMAL HIGH (ref 0.30–0.70)

## 2020-10-17 LAB — D-DIMER, QUANTITATIVE: D-Dimer, Quant: 9.79 ug/mL-FEU — ABNORMAL HIGH (ref 0.00–0.50)

## 2020-10-17 LAB — C-REACTIVE PROTEIN: CRP: 20.3 mg/dL — ABNORMAL HIGH (ref <1.0)

## 2020-10-17 LAB — RENAL FUNCTION PANEL
Albumin: 3.1 g/dL — ABNORMAL LOW (ref 3.5–5.0)
Anion gap: 19 — ABNORMAL HIGH (ref 5–15)
BUN: 94 mg/dL — ABNORMAL HIGH (ref 8–23)
CO2: 12 mmol/L — ABNORMAL LOW (ref 22–32)
Calcium: 6 mg/dL — CL (ref 8.9–10.3)
Chloride: 98 mmol/L (ref 98–111)
Creatinine, Ser: 7.42 mg/dL — ABNORMAL HIGH (ref 0.61–1.24)
GFR, Estimated: 7 mL/min — ABNORMAL LOW (ref >60)
Glucose, Bld: 191 mg/dL — ABNORMAL HIGH (ref 70–99)
Phosphorus: 8.3 mg/dL — ABNORMAL HIGH (ref 2.5–4.6)
Potassium: 4 mmol/L (ref 3.5–5.1)
Sodium: 129 mmol/L — ABNORMAL LOW (ref 135–145)

## 2020-10-17 LAB — APTT
aPTT: 144 seconds — ABNORMAL HIGH (ref 24–36)
aPTT: 60 seconds — ABNORMAL HIGH (ref 24–36)

## 2020-10-17 LAB — LIPID PANEL
Cholesterol: 124 mg/dL (ref 0–200)
HDL: 38 mg/dL — ABNORMAL LOW (ref >40)
LDL Cholesterol: 64 mg/dL (ref 0–99)
Total CHOL/HDL Ratio: 3.3 RATIO
Triglycerides: 110 mg/dL (ref <150)
VLDL: 22 mg/dL (ref 0–40)

## 2020-10-17 LAB — PROCALCITONIN: Procalcitonin: 4.27 ng/mL

## 2020-10-17 LAB — MAGNESIUM: Magnesium: 2.1 mg/dL (ref 1.7–2.4)

## 2020-10-17 MED ORDER — ORAL CARE MOUTH RINSE
15.0000 mL | Freq: Two times a day (BID) | OROMUCOSAL | Status: DC
  Administered 2020-10-17 – 2020-11-10 (×49): 15 mL via OROMUCOSAL

## 2020-10-17 MED ORDER — INSULIN ASPART 100 UNIT/ML ~~LOC~~ SOLN
1.0000 [IU] | SUBCUTANEOUS | Status: DC
  Administered 2020-10-17: 2 [IU] via SUBCUTANEOUS
  Administered 2020-10-18: 1 [IU] via SUBCUTANEOUS
  Administered 2020-10-18 (×2): 2 [IU] via SUBCUTANEOUS
  Administered 2020-10-18 (×3): 1 [IU] via SUBCUTANEOUS
  Administered 2020-10-19 (×3): 2 [IU] via SUBCUTANEOUS

## 2020-10-17 MED ORDER — PHENYLEPHRINE HCL-NACL 10-0.9 MG/250ML-% IV SOLN
25.0000 ug/min | INTRAVENOUS | Status: DC
  Administered 2020-10-17: 200 ug/min via INTRAVENOUS
  Administered 2020-10-17: 25 ug/min via INTRAVENOUS
  Administered 2020-10-17: 160 ug/min via INTRAVENOUS
  Filled 2020-10-17 (×3): qty 250

## 2020-10-17 MED ORDER — AMIODARONE HCL IN DEXTROSE 360-4.14 MG/200ML-% IV SOLN
30.0000 mg/h | INTRAVENOUS | Status: DC
  Administered 2020-10-17 – 2020-10-18 (×2): 30 mg/h via INTRAVENOUS
  Filled 2020-10-17 (×3): qty 200

## 2020-10-17 MED ORDER — HEPARIN (PORCINE) 2000 UNITS/L FOR CRRT: INTRAVENOUS_CENTRAL | Status: DC | PRN

## 2020-10-17 MED ORDER — SODIUM CHLORIDE 0.9 % IV SOLN
4.0000 g | Freq: Two times a day (BID) | INTRAVENOUS | Status: AC
  Administered 2020-10-17 (×2): 4 g via INTRAVENOUS
  Filled 2020-10-17 (×3): qty 40

## 2020-10-17 MED ORDER — SODIUM CHLORIDE 0.9 % IV SOLN
250.0000 mL | INTRAVENOUS | Status: DC
  Administered 2020-10-17: 250 mL via INTRAVENOUS

## 2020-10-17 MED ORDER — LACTATED RINGERS IV BOLUS
1000.0000 mL | Freq: Once | INTRAVENOUS | Status: AC
  Administered 2020-10-17: 1000 mL via INTRAVENOUS

## 2020-10-17 MED ORDER — ALBUMIN HUMAN 25 % IV SOLN
50.0000 g | Freq: Once | INTRAVENOUS | Status: AC
  Administered 2020-10-17: 12.5 g via INTRAVENOUS
  Filled 2020-10-17: qty 200

## 2020-10-17 MED ORDER — AMIODARONE HCL IN DEXTROSE 360-4.14 MG/200ML-% IV SOLN
60.0000 mg/h | INTRAVENOUS | Status: AC
  Administered 2020-10-17: 60 mg/h via INTRAVENOUS
  Filled 2020-10-17: qty 200

## 2020-10-17 MED ORDER — NOREPINEPHRINE 4 MG/250ML-% IV SOLN
0.0000 ug/min | INTRAVENOUS | Status: DC
  Administered 2020-10-17: 24 ug/min via INTRAVENOUS
  Administered 2020-10-17: 10 ug/min via INTRAVENOUS
  Administered 2020-10-17: 24 ug/min via INTRAVENOUS
  Administered 2020-10-17: 2 ug/min via INTRAVENOUS
  Administered 2020-10-18: 4 ug/min via INTRAVENOUS
  Administered 2020-10-18: 8 ug/min via INTRAVENOUS
  Administered 2020-10-19: 12 ug/min via INTRAVENOUS
  Administered 2020-10-19: 8 ug/min via INTRAVENOUS
  Administered 2020-10-20: 4 ug/min via INTRAVENOUS
  Administered 2020-10-21: 14 ug/min via INTRAVENOUS
  Administered 2020-10-21: 15 ug/min via INTRAVENOUS
  Administered 2020-10-21: 8 ug/min via INTRAVENOUS
  Administered 2020-10-21: 13 ug/min via INTRAVENOUS
  Administered 2020-10-21: 12 ug/min via INTRAVENOUS
  Administered 2020-10-22 (×3): 8 ug/min via INTRAVENOUS
  Filled 2020-10-17 (×16): qty 250

## 2020-10-17 MED ORDER — AMIODARONE LOAD VIA INFUSION
150.0000 mg | Freq: Once | INTRAVENOUS | Status: AC
  Administered 2020-10-17: 150 mg via INTRAVENOUS
  Filled 2020-10-17: qty 83.34

## 2020-10-17 MED ORDER — LACTATED RINGERS IV SOLN: INTRAVENOUS | Status: DC

## 2020-10-17 MED ORDER — SODIUM CHLORIDE 0.9 % IV BOLUS
500.0000 mL | Freq: Once | INTRAVENOUS | Status: AC
  Administered 2020-10-17: 500 mL via INTRAVENOUS

## 2020-10-17 MED ORDER — STERILE WATER FOR INJECTION IV SOLN
INTRAVENOUS | Status: DC
  Filled 2020-10-17 (×16): qty 150

## 2020-10-17 MED ORDER — HEPARIN (PORCINE) 25000 UT/250ML-% IV SOLN
900.0000 [IU]/h | INTRAVENOUS | Status: DC
  Administered 2020-10-17: 14:00:00 800 [IU]/h via INTRAVENOUS
  Administered 2020-10-18: 900 [IU]/h via INTRAVENOUS
  Filled 2020-10-17 (×2): qty 250

## 2020-10-17 MED ORDER — PRISMASOL BGK 4/2.5 32-4-2.5 MEQ/L EC SOLN
Status: DC
  Filled 2020-10-17 (×49): qty 5000

## 2020-10-17 MED ORDER — SODIUM CHLORIDE 0.9 % IV SOLN
INTRAVENOUS | Status: DC | PRN
  Administered 2020-10-18 – 2020-10-19 (×3): 500 mL via INTRAVENOUS
  Administered 2020-10-23 – 2020-10-24 (×2): 250 mL via INTRAVENOUS

## 2020-10-17 MED ORDER — SODIUM CHLORIDE 0.9 % IV SOLN
2.0000 g | Freq: Two times a day (BID) | INTRAVENOUS | Status: DC
  Administered 2020-10-17 – 2020-10-20 (×7): 2 g via INTRAVENOUS
  Filled 2020-10-17 (×7): qty 2

## 2020-10-17 MED ORDER — HEPARIN SODIUM (PORCINE) 1000 UNIT/ML DIALYSIS
1000.0000 [IU] | INTRAMUSCULAR | Status: DC | PRN
  Administered 2020-10-17 – 2020-10-20 (×2): 2600 [IU] via INTRAVENOUS_CENTRAL
  Administered 2020-10-26: 3000 [IU] via INTRAVENOUS_CENTRAL
  Filled 2020-10-17: qty 6
  Filled 2020-10-17: qty 3
  Filled 2020-10-17: qty 6
  Filled 2020-10-17: qty 4
  Filled 2020-10-17 (×2): qty 6

## 2020-10-17 MED ORDER — VASOPRESSIN 20 UNITS/100 ML INFUSION FOR SHOCK
0.0000 [IU]/min | INTRAVENOUS | Status: DC
  Administered 2020-10-17 – 2020-10-18 (×3): 0.03 [IU]/min via INTRAVENOUS
  Administered 2020-10-19 – 2020-10-20 (×3): 0.04 [IU]/min via INTRAVENOUS
  Filled 2020-10-17 (×6): qty 100

## 2020-10-17 MED ORDER — PRISMASOL BGK 4/2.5 32-4-2.5 MEQ/L REPLACEMENT SOLN
Status: DC
  Filled 2020-10-17 (×12): qty 5000

## 2020-10-17 MED ORDER — PANTOPRAZOLE SODIUM 40 MG IV SOLR
40.0000 mg | INTRAVENOUS | Status: DC
  Administered 2020-10-17 – 2020-10-19 (×3): 40 mg via INTRAVENOUS
  Filled 2020-10-17 (×3): qty 40

## 2020-10-17 NOTE — Progress Notes (Addendum)
Cross-coverage note:   Patient seen for a fib RVR.   HR is 140, BP 84/35. RR upper 20s, sat 98% on 4 Lpm.   Patient disoriented, respirations labored, confusion and respirations unchanged per RN.   Plan to give NS bolus, start amiodarone.   ADDENDUM: SBP 70s after fluid bolus and 50 g albumin was then given. SBP up to 90 with albumin but then dropped to 50 when amio was started. Amiodarone was stopped and saline bolus started while waiting for phenylepherine. Phenylephrine was started and PCCM contacted. Pressures improving with phenylephrine when day team took over.

## 2020-10-17 NOTE — Progress Notes (Signed)
[place] for Heparin Indication: atrial fibrillation  Allergies  Allergen Reactions  . Povidone-Iodine Swelling  . Tape Swelling  . Bee Venom Rash  . Covid-19 Mrna Vacc (Moderna) Hives and Rash    Rash came after booster shot. Pt was fine with 1st and 2nd    Patient Measurements: Height: 5\' 8"  (172.7 cm) Weight: 108 kg (238 lb 1.6 oz) IBW/kg (Calculated) : 68.4 Heparin Dosing Weight: 88.5 kg  Vital Signs: Temp: 95.36 F (35.2 C) (01/04 2100) Temp Source: Bladder (01/04 2000) BP: 109/57 (01/04 2100) Pulse Rate: 123 (01/04 2100)  Labs: Recent Labs    10/15/20 0445 10/15/20 0445 10/16/20 0949 10/16/20 2000 10/16/20 2011 10/17/20 0443 10/17/20 1619 10/17/20 1904  HGB 11.2*  --  10.9*  --   --  9.2*  --   --   HCT 34.3*  --  31.9*  --   --  26.4*  --   --   PLT 157  --  170  --   --  151  --   --   APTT  --    < > >200*  --  72* 144*  --  60*  HEPARINUNFRC  --   --   --  2.16*  --  2.06*  --  1.58*  CREATININE 6.34*  --  7.98*  --   --  8.62* 7.42*  --    < > = values in this interval not displayed.    Estimated Creatinine Clearance: 8.4 mL/min (A) (by C-G formula based on SCr of 7.42 mg/dL (H)).   Medical History: Past Medical History:  Diagnosis Date  . Atrial fibrillation ([place])   . Melanoma ([place])   . Vitamin B 12 deficiency     Medications:  Scheduled:  . albuterol  2 puff Inhalation Q6H  . vitamin C  500 mg Oral Daily  . Chlorhexidine Gluconate Cloth  6 each Topical Daily  . Gerhardt's butt cream   Topical TID  . insulin aspart  1-3 Units Subcutaneous Q4H  . methylPREDNISolone (SOLU-MEDROL) injection  60 mg Intravenous Q12H  . pantoprazole (PROTONIX) IV  40 mg Intravenous Q24H  . QUEtiapine  25 mg Oral BID  . sodium chloride flush  3 mL Intravenous Q12H  . zinc sulfate  220 mg Oral Daily    Assessment: Patient is a 84 yom that is being admitted for COVID and AKI. Patient takes Apixaban pta for aflutter.  Pharmacy has been asked to dose heparin at this time due to concerns for taking oral meds. Last dose of Apixaban was at 1030 on 10/15/20  Repeat aPTT this evening is now subtherapeutic at 60 seconds, heparin level remains falsely elevated by DOAC use. Pt coags have been very labile so will be cautious with adjustment.  Goal of Therapy:  aPTT 66-102 seconds Monitor platelets by anticoagulation protocol: Yes   Plan:  Increase heparin to 850 units/h Recheck aPTT and heparin level with am labs Follow CBC   Arrie Senate, PharmD, BCPS, [facility] Clinical Pharmacist [phone] Please check AMION for all [place] numbers 10/17/2020

## 2020-10-17 NOTE — Progress Notes (Signed)
Pharmacy Antibiotic Note  Raymond Hurley. is a 87 y.o. male admitted on 10/14/2020 with pneumonia.  Patient with acutely worsening hypotension, signifigant leukocytosis. Pharmacy has been consulted for cefepime dosing. Patient is on CRRT.  Plan: Cefepime 2g q12hr  Height: 5\' 8"  (172.7 cm) Weight: 108 kg (238 lb 1.6 oz) IBW/kg (Calculated) : 68.4  Temp (24hrs), Avg:98.2 F (36.8 C), Min:98 F (36.7 C), Max:98.6 F (37 C)  Recent Labs  Lab 10/14/20 0123 10/14/20 0129 10/14/20 0144 10/14/20 0412 10/14/20 0909 10/15/20 0445 10/16/20 0949 10/17/20 0443  WBC 30.6*  --   --   --   --  42.7* 50.7* 46.4*  CREATININE 5.89* 5.60*  --   --  5.08* 6.34* 7.98* 8.62*  LATICACIDVEN  --   --  2.5* 1.8  --   --   --   --     Estimated Creatinine Clearance: 7.2 mL/min (A) (by C-G formula based on SCr of 8.62 mg/dL (H)).    Allergies  Allergen Reactions  . Povidone-Iodine Swelling  . Tape Swelling  . Bee Venom Rash  . Covid-19 Mrna Vacc (Moderna) Hives and Rash    Rash came after booster shot. Pt was fine with 1st and 2nd    Antimicrobials this admission: Unasyn 1/2 >> 1/4 Ceftriaxone 1/1 >> 1/3 Doxycycline 1/2 >> 1/3 Cefepime 1/4 >>  Microbiology results: 1/1 BCx: no growth 3 days 1/2 MRSA PCR: negative  Thank you for allowing pharmacy to be a part of this patient's care.  Norina Buzzard, PharmD PGY1 Pharmacy Resident 10/17/2020 3:00 PM

## 2020-10-17 NOTE — Significant Event (Addendum)
Rapid Response Event Note   Reason for Call :  Afib RVR, HR 140s Hypotension BP 63/37 Progressive confusion  Initial Focused Assessment:  Pt lying in bed. Alert, disoriented x3. Following commands and moving all extremities appropriately. Skin is cool, dry, pale. Lung sounds are clear, diminished. He is tachypneic, RR 28-40, and accessory muscle use noted. Moderate distress. Abdomen is firm, round. Bowel sounds are faint. No peripheral edema noted, no JVD.   VS: T 98.17F, BP 68/49, HR 141, RR 30, SpO2 99% on 5LNC  Interventions:  -1.5L IVF bolus & 50g/200cc Albumin -Amiodarone gtt- bolus stopped d/t hypotension (SBP 60s), maintenance dose started once vasopressors were initiated -Phenylephrine gtt -EKG for right sided chest pain- reviewed by Dr. Candiss Norse  Plan of Care:  Transfer to ICU  Event Summary:  MD Notified: Dr. Myna Hidalgo, MD at bedside Call Time: 0710 Arrival Time: 0715 End Time: [place], RN

## 2020-10-17 NOTE — Progress Notes (Addendum)
PROGRESS NOTE                                                                                                                                                                                                             Patient Demographics:    Raymond Hurley, is a 87 y.o. male, DOB - 1931-08-12, ZOX:[id number]  Outpatient Primary MD for the patient is Lajean Manes, MD    LOS - 3  Admit date - 10/14/2020    Chief Complaint  Patient presents with  . Code Stroke       Brief Narrative (HPI from H&P)  Raymond Irion. is a 87 y.o. 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  He also went into A. fib with RVR, severe hypotension, circulatory shock, worsening AKI, severe pancreatitis, at this time he will be transferred to ICU.  Cardiology, pulmonary, renal, GI following.  Prognosis is extremely poor.   Subjective:   Patient in bed, confused, denies any headache no chest or abdominal pain.  No shortness of breath.   Assessment  & Plan :    1. Acute Hypoxic Resp. Failure due to Acute Covid 19 Viral Pneumonitis along with possible aspiration pneumonia causing sepsis - he is fully vaccinated including the booster dose, COVID-19 likely incidental.  His main pulmonary issue seems to have been aspiration pneumonia for which  she is getting antibiotics.  Cultures so far negative.  Continue supportive care.    SpO2: 100 % O2 Flow Rate (L/min): 7 L/min  Recent Labs  Lab 10/14/20 0123 10/14/20 0129 10/14/20 0144 10/14/20 0221 10/14/20 0412 10/14/20 0909 10/15/20 0445 10/15/20 1637 10/16/20 0949 10/16/20 0953 10/17/20 0443  WBC 30.6*  --   --   --   --   --  42.7*  --  50.7*  --  46.4*  HGB 12.5* 13.6  --   --   --   --  11.2*  --  10.9*  --  9.2*  HCT 36.9* 40.0  --   --   --   --  34.3*  --  31.9*  --  26.4*  PLT 162  --   --   --   --   --  157  --  170  --  151  CRP  --   --   --   --   --  32.0* 36.3*  --  32.8*  --  20.3*  BNP  --   --  137.7*  --   --   --  238.6*  --   --  811.3* 262.0*  DDIMER  --   --   --   --   --  5.74* 7.77*  --  10.70*  --  9.79*  PROCALCITON  --   --   --   --   --  2.87  --  5.45 4.97  --  4.27  AST 86*  --   --   --   --   --  65*  --  54*  --  50*  ALT 68*  --   --   --   --   --  48*  --  43  --  40  ALKPHOS 56  --   --   --   --   --  53  --  66  --  58  BILITOT 0.9  --   --   --   --   --  0.6  --  0.5  --  1.1  ALBUMIN 2.8*  --   --   --   --   --  2.1*  --  2.5*  --  2.3*  INR 1.4*  --   --   --   --   --   --   --   --   --   --   LATICACIDVEN  --   --  2.5*  --  1.8  --   --   --   --   --   --   SARSCOV2NAA  --   --   --  POSITIVE*  --   --   --   --   --   --   --     2.  AKI likely caused by severe acute pancreatitis, third spacing causing hypovolemic shock.  Family now recalls that patient had pancreatic melanoma diagnosed few years ago and treated at [facility], CT scan abdomen pelvis which was done to look for any infectious focus revealed painless but severe acute pancreatitis, IV fluids, supportive care, replace calcium, GI following along with renal.  Renal contemplating CRRT in ICU.  PCCM team also following will be transferred to ICU.  3.  Constipation.  Placed on bowel regimen   4.  History of urinary retention.  Monitor bladder scans.  5.   Hypokalemia.  Replaced.  6.  Transient facial droop on the left side.  Resolved MRI negative.  Already on Eliquis >> Hep Gtt.  7.  Paroxysmal A. fib.  Currently in RVR.  Mali vas 2 score of greater than 3.  Had to place him on amnio drip on 10/17/2020 along with heparin drip, was chronically on Eliquis.  8.  History of chronic systolic heart failure EF has  now improved to 55% this admission.  Cardiology consult has been placed and they are following, continue supportive care.  Currently to hypotensive and requiring IV fluids.  9.  Hypocalcemia.  Has been aggressively replaced likely staying persistently low due to pancreatitis, replaced with 8 g of IV calcium on 10/17/2020.  10. Psych encephalopathy.  Supportive care, minimize narcotics and benzodiazepines.  As needed Haldol if needed.  11.  Mild elevation of troponin.  Non-ACS pattern, no chest pain, likely due to profound hypotension causing demand mismatch.  Echo stable with improved EF and no wall motion abnormality.      Condition - Extremely Guarded  Family Communication  :  Wife Raymond Hurley [phone] on 10/15/20, updated 10/16/2020, she is very upset that why his kidneys have not recovered and was in a very bad mood and shouting, 10/18/19 updated,   Code Status :    Consults  :  Renal, PCCM  Procedures  :    CT abdomen pelvis non contrast  -   1. Large amount of inflammatory changes are noted involving the pancreas consistent with severe acute pancreatitis. Fluid is seen extending into both pericolic gutters as well as around the liver and spleen. No definite pseudocyst formation is seen at this time. 2. Solitary gallstone is noted. 3. Sigmoid diverticulosis without evidence of diverticulitis. 4. Mild bilateral pleural effusions are noted with adjacent subsegmental atelectasis. 5. Aortic atherosclerosis.  TTE -   1. Left ventricular ejection fraction, by estimation, is 55 to 60%. The left ventricle has normal function. The left  ventricle has no regional wall motion abnormalities. Left ventricular diastolic function could not be evaluated.  2. Right ventricular systolic function is normal. The right ventricular size is normal. There is normal pulmonary artery systolic pressure. The estimated right ventricular systolic pressure is 63.7 mmHg.  3. Left atrial size was mildly dilated.  4. Right atrial size was mildly dilated.  5. The mitral valve is normal in structure. No evidence of mitral valve regurgitation. No evidence of mitral stenosis.  6. The aortic valve is normal in structure. Aortic valve regurgitation is not visualized. No aortic stenosis is present.  7. The inferior vena cava is normal in size with greater than 50% respiratory variability, suggesting right atrial pressure of 3 mmHg.  Renal US - non acute  MRI - 1. Negative for acute infarct. Mild chronic microvascular ischemic change in the white matter. 2. Negative MRA  PUD Prophylaxis : PPI  Disposition Plan  :    Status is: Inpatient  Remains inpatient appropriate because:IV treatments appropriate due to intensity of illness or inability to take PO   Dispo: The patient is from: Home              Anticipated d/c is to: SNF              Anticipated d/c date is: > 3 days              Patient currently is not medically stable to d/c.   DVT Prophylaxis  : Eliquis  Lab Results  Component Value Date   PLT 151 10/17/2020    Diet :  Diet Order            Diet NPO time specified Except for: Sips with Meds  Diet effective now                  Inpatient Medications  Scheduled Meds: . albuterol  2 puff Inhalation Q6H  . vitamin  C  500 mg Oral Daily  . Chlorhexidine Gluconate Cloth  6 each Topical Daily  . Gerhardt's butt cream   Topical TID  . methylPREDNISolone (SOLU-MEDROL) injection  60 mg Intravenous Q12H  . metoprolol tartrate  12.5 mg Oral TID  . pantoprazole  40 mg Oral Daily  . Pedialyte  1,000 mL Oral Q4H  . QUEtiapine   25 mg Oral BID  . sodium chloride flush  3 mL Intravenous Q12H  . zinc sulfate  220 mg Oral Daily   Continuous Infusions: . sodium chloride    . amiodarone 60 mg/hr (10/17/20 0823)   Followed by  . amiodarone    . ampicillin-sulbactam (UNASYN) IV 3 g (10/16/20 1011)  . calcium gluconate    . heparin 1,000 Units/hr (10/16/20 1350)  . phenylephrine (NEO-SYNEPHRINE) Adult infusion 75 mcg/min (10/17/20 0833)  . sodium bicarbonate (isotonic) 150 mEq in D5W 1000 mL infusion 125 mL/hr at 10/16/20 2344   PRN Meds:.acetaminophen, chlorpheniramine-HYDROcodone, guaiFENesin-dextromethorphan, haloperidol lactate, loperamide, melatonin, polyethylene glycol, triamcinolone  Antibiotics  :    Anti-infectives (From admission, onward)   Start     Dose/Rate Route Frequency Ordered Stop   10/15/20 1000  remdesivir 100 mg in sodium chloride 0.9 % 100 mL IVPB  Status:  Discontinued       "Followed by" Linked Group Details   100 mg 200 mL/hr over 30 Minutes Intravenous Daily 10/14/20 0828 10/14/20 1324   10/15/20 1000  doxycycline (VIBRAMYCIN) 100 mg in sodium chloride 0.9 % 250 mL IVPB  Status:  Discontinued        100 mg 125 mL/hr over 120 Minutes Intravenous Every 12 hours 10/15/20 0952 10/17/20 0715   10/15/20 1000  Ampicillin-Sulbactam (UNASYN) 3 g in sodium chloride 0.9 % 100 mL IVPB        3 g 200 mL/hr over 30 Minutes Intravenous Every 24 hours 10/15/20 0952     10/14/20 0830  remdesivir 200 mg in sodium chloride 0.9% 250 mL IVPB       "Followed by" Linked Group Details   200 mg 580 mL/hr over 30 Minutes Intravenous Once 10/14/20 0828 10/14/20 1100   10/14/20 0830  cefTRIAXone (ROCEPHIN) 2 g in sodium chloride 0.9 % 100 mL IVPB  Status:  Discontinued        2 g 200 mL/hr over 30 Minutes Intravenous Every 24 hours 10/14/20 0829 10/15/20 0952       Time Spent in minutes  30   Lala Lund M.D on 10/17/2020 at 8:45 AM  To page go to [email or web address]   [facility] -  Office   [phone]     See all Orders from today for further details    Objective:   Vitals:   10/17/20 0815 10/17/20 0820 10/17/20 0825 10/17/20 0830  BP: (!) 79/44  (!) 73/54 (!) 74/46  Pulse: (!) 132  (!) 137 (!) 130  Resp:      Temp:      TempSrc:      SpO2: 99% 99% 99% 100%  Weight:      Height:        Wt Readings from Last 3 Encounters:  10/14/20 95.1 kg  07/05/20 95.1 kg  06/13/20 96.2 kg    No intake or output data in the 24 hours ending 10/17/20 0845   Physical Exam  Awake but confused, No new F.N deficits,   [place].AT,PERRAL Supple Neck,No JVD, No cervical lymphadenopathy appriciated.  Symmetrical Chest wall movement, Good air movement bilaterally, CTAB iRRR,No  Gallops, Rubs or new Murmurs, No Parasternal Heave +ve B.Sounds, Abd Soft, No tenderness, No organomegaly appriciated, No rebound - guarding or rigidity. No Cyanosis, Clubbing or edema, No new Rash or bruise    Data Review:    CBC Recent Labs  Lab 10/14/20 0123 10/14/20 0129 10/15/20 0445 10/16/20 0949 10/17/20 0443  WBC 30.6*  --  42.7* 50.7* 46.4*  HGB 12.5* 13.6 11.2* 10.9* 9.2*  HCT 36.9* 40.0 34.3* 31.9* 26.4*  PLT 162  --  157 170 151  MCV 107.9*  --  107.9* 105.3* 104.3*  MCH 36.5*  --  35.2* 36.0* 36.4*  MCHC 33.9  --  32.7 34.2 34.8  RDW 14.3  --  14.3 14.9 15.0  LYMPHSABS 0.7  --  0.5* 0.7 0.7  MONOABS 9.6*  --  10.1* 7.8* 6.2*  EOSABS 0.0  --  0.0 0.1 0.1  BASOSABS 0.0  --  0.0 0.0 0.0    Recent Labs  Lab 10/14/20 0123 10/14/20 0129 10/14/20 0144 10/14/20 0412 10/14/20 0909 10/15/20 0445 10/15/20 1637 10/16/20 0949 10/16/20 0953 10/17/20 0443  NA 130* 130*  --   --  129* 131*  --  130*  --  127*  K 3.8 3.8  --   --  3.4* 3.4*  --  4.0  --  3.8  CL 96* 96*  --   --  97* 101  --  98  --  98  CO2 18*  --   --   --  19* 13*  --  10*  --  11*  GLUCOSE 145* 138*  --   --  103* 107*  --  111*  --  173*  BUN 57* 59*  --   --  56* 72*  --  98*  --  108*  CREATININE 5.89*  5.60*  --   --  5.08* 6.34*  --  7.98*  --  8.62*  CALCIUM 4.5*  --   --   --  4.5* 5.1*  --  5.3*  --  5.4*  AST 86*  --   --   --   --  65*  --  54*  --  50*  ALT 68*  --   --   --   --  48*  --  43  --  40  ALKPHOS 56  --   --   --   --  53  --  66  --  58  BILITOT 0.9  --   --   --   --  0.6  --  0.5  --  1.1  ALBUMIN 2.8*  --   --   --   --  2.1*  --  2.5*  --  2.3*  MG  --   --   --   --  1.2* 2.2  --  2.2  --  2.1  CRP  --   --   --   --  32.0* 36.3*  --  32.8*  --  20.3*  DDIMER  --   --   --   --  5.74* 7.77*  --  10.70*  --  9.79*  PROCALCITON  --   --   --   --  2.87  --  5.45 4.97  --  4.27  LATICACIDVEN  --   --  2.5* 1.8  --   --   --   --   --   --   INR 1.4*  --   --   --   --   --   --   --   --   --  HGBA1C  --   --   --   --  5.7*  --   --   --   --   --   BNP  --   --  137.7*  --   --  238.6*  --   --  811.3* 262.0*    ------------------------------------------------------------------------------------------------------------------ Recent Labs    10/14/20 0909 10/17/20 0443  CHOL 117 124  HDL 44 38*  LDLCALC 57 64  TRIG 82 110  CHOLHDL 2.7 3.3    Lab Results  Component Value Date   HGBA1C 5.7 (H) 10/14/2020   ------------------------------------------------------------------------------------------------------------------ No results for input(s): TSH, T4TOTAL, T3FREE, THYROIDAB in the last 72 hours.  Invalid input(s): FREET3  Cardiac Enzymes No results for input(s): CKMB, TROPONINI, MYOGLOBIN in the last 168 hours.  Invalid input(s): CK ------------------------------------------------------------------------------------------------------------------    Component Value Date/Time   BNP 262.0 (H) 10/17/2020 0443    Micro Results Recent Results (from the past 240 hour(s))  Resp Panel by RT-PCR (Flu A&B, Covid) Nasopharyngeal Swab     Status: Abnormal   Collection Time: 10/14/20  2:21 AM   Specimen: Nasopharyngeal Swab; Nasopharyngeal(NP) swabs in  vial transport medium  Result Value Ref Range Status   SARS Coronavirus 2 by RT PCR POSITIVE (A) NEGATIVE Final    Comment: RESULT CALLED TO, READ BACK BY AND VERIFIED WITH: G CERER RN 10/14/20 0349 JDW (NOTE) SARS-CoV-2 target nucleic acids are DETECTED.  The SARS-CoV-2 RNA is generally detectable in upper respiratory specimens during the acute phase of infection. Positive results are indicative of the presence of the identified virus, but do not rule out bacterial infection or co-infection with other pathogens not detected by the test. Clinical correlation with patient history and other diagnostic information is necessary to determine patient infection status. The expected result is Negative.  Fact Sheet for Patients: EntrepreneurPulse.com.au  Fact Sheet for Healthcare Providers: IncredibleEmployment.be  This test is not yet approved or cleared by the [place] FDA and  has been authorized for detection and/or diagnosis of SARS-CoV-2 by FDA under an Emergency Use Authorization (EUA).  This EUA will remain in effect (meaning this test can be used)  for the duration of  the COVID-19 declaration under Section 564(b)(1) of the Act, 21 U.S.C. section 360bbb-3(b)(1), unless the authorization is terminated or revoked sooner.     Influenza A by PCR NEGATIVE NEGATIVE Final   Influenza B by PCR NEGATIVE NEGATIVE Final    Comment: (NOTE) The Xpert Xpress SARS-CoV-2/FLU/RSV plus assay is intended as an aid in the diagnosis of influenza from Nasopharyngeal swab specimens and should not be used as a sole basis for treatment. Nasal washings and aspirates are unacceptable for Xpert Xpress SARS-CoV-2/FLU/RSV testing.  Fact Sheet for Patients: EntrepreneurPulse.com.au  Fact Sheet for Healthcare Providers: IncredibleEmployment.be  This test is not yet approved or cleared by the [place] FDA and has been  authorized for detection and/or diagnosis of SARS-CoV-2 by FDA under an Emergency Use Authorization (EUA). This EUA will remain in effect (meaning this test can be used) for the duration of the COVID-19 declaration under Section 564(b)(1) of the Act, 21 U.S.C. section 360bbb-3(b)(1), unless the authorization is terminated or revoked.  Performed at [facility], [place] [address], [place], [place] [address]   Culture, blood (routine x 2)     Status: None (Preliminary result)   Collection Time: 10/14/20 10:36 AM   Specimen: BLOOD RIGHT HAND  Result Value Ref Range Status   Specimen Description BLOOD  RIGHT HAND  Final   Special Requests   Final    BOTTLES DRAWN AEROBIC AND ANAEROBIC Blood Culture adequate volume   Culture   Final    NO GROWTH 3 DAYS Performed at [facility], [address] [place], [place] [address]    Report Status PENDING  Incomplete  Culture, blood (routine x 2)     Status: None (Preliminary result)   Collection Time: 10/14/20 10:36 AM   Specimen: BLOOD LEFT HAND  Result Value Ref Range Status   Specimen Description BLOOD LEFT HAND  Final   Special Requests   Final    BOTTLES DRAWN AEROBIC AND ANAEROBIC Blood Culture results may not be optimal due to an inadequate volume of blood received in culture bottles   Culture   Final    NO GROWTH 3 DAYS Performed at [facility], [place] [address], [place], [place] [address]    Report Status PENDING  Incomplete  Gastrointestinal Panel by PCR , Stool     Status: None   Collection Time: 10/14/20 11:00 AM   Specimen: Stool  Result Value Ref Range Status   Campylobacter species NOT DETECTED NOT DETECTED Final   Plesimonas shigelloides NOT DETECTED NOT DETECTED Final   Salmonella species NOT DETECTED NOT DETECTED Final   Yersinia enterocolitica NOT DETECTED NOT DETECTED Final   Vibrio species NOT DETECTED NOT DETECTED Final   Vibrio cholerae NOT DETECTED NOT DETECTED Final   Enteroaggregative E coli (EAEC)  NOT DETECTED NOT DETECTED Final   Enteropathogenic E coli (EPEC) NOT DETECTED NOT DETECTED Final   Enterotoxigenic E coli (ETEC) NOT DETECTED NOT DETECTED Final   Shiga like toxin producing E coli (STEC) NOT DETECTED NOT DETECTED Final   Shigella/Enteroinvasive E coli (EIEC) NOT DETECTED NOT DETECTED Final   Cryptosporidium NOT DETECTED NOT DETECTED Final   Cyclospora cayetanensis NOT DETECTED NOT DETECTED Final   Entamoeba histolytica NOT DETECTED NOT DETECTED Final   Giardia lamblia NOT DETECTED NOT DETECTED Final   Adenovirus F40/41 NOT DETECTED NOT DETECTED Final   Astrovirus NOT DETECTED NOT DETECTED Final   Norovirus GI/GII NOT DETECTED NOT DETECTED Final   Rotavirus A NOT DETECTED NOT DETECTED Final   Sapovirus (I, II, IV, and V) NOT DETECTED NOT DETECTED Final    Comment: Performed at [facility], [address] [place], [place] [address]  C Difficile Quick Screen w PCR reflex     Status: None   Collection Time: 10/14/20 11:00 AM   Specimen: STOOL  Result Value Ref Range Status   C Diff antigen NEGATIVE NEGATIVE Final   C Diff toxin NEGATIVE NEGATIVE Final   C Diff interpretation No C. difficile detected.  Final    Comment: Performed at [facility], [place] [address], [place], [place] [address]  MRSA PCR Screening     Status: None   Collection Time: 10/15/20 12:21 PM   Specimen: Nasal Mucosa; Nasopharyngeal  Result Value Ref Range Status   MRSA by PCR NEGATIVE NEGATIVE Final    Comment:        The GeneXpert MRSA Assay (FDA approved for NASAL specimens only), is one component of a comprehensive MRSA colonization surveillance program. It is not intended to diagnose MRSA infection nor to guide or monitor treatment for MRSA infections. Performed at [facility], [place] [address] [place], [place] [address]     Radiology Reports CT ABDOMEN PELVIS WO CONTRAST  Result Date: 10/16/2020 CLINICAL DATA:  Acute  generalized abdominal pain. EXAM: CT ABDOMEN  AND PELVIS WITHOUT CONTRAST TECHNIQUE: Multidetector CT imaging of the abdomen and pelvis was performed following the standard protocol without IV contrast. COMPARISON:  None. FINDINGS: Lower chest: Mild bilateral pleural effusions are noted with adjacent subsegmental atelectasis. Hepatobiliary: Solitary gallstone is noted. No biliary dilatation is noted. Mild amount of fluid is noted around the liver. No focal abnormality is noted in the liver. Pancreas: Large amount of inflammatory changes are noted involving the pancreas consistent with severe acute pancreatitis. Fluid is seen extending into both pericolic gutters. No definite pseudocyst formation is seen at this time. Spleen: Mild amount of fluid is noted around the spleen. Adrenals/Urinary Tract: Adrenal glands appear normal. Left renal cyst is noted. No hydronephrosis or renal obstruction is noted. No renal or ureteral calculi are noted. Urinary bladder is decompressed secondary to Foley catheter. Stomach/Bowel: Stomach appears normal. There is no evidence of bowel obstruction. Sigmoid diverticulosis is noted without evidence of diverticulitis. Vascular/Lymphatic: Aortic atherosclerosis. No enlarged abdominal or pelvic lymph nodes. Reproductive: Prostate is unremarkable. Other: No abdominal wall hernia or abnormality. No abdominopelvic ascites. Musculoskeletal: No acute or significant osseous findings. IMPRESSION: 1. Large amount of inflammatory changes are noted involving the pancreas consistent with severe acute pancreatitis. Fluid is seen extending into both pericolic gutters as well as around the liver and spleen. No definite pseudocyst formation is seen at this time. 2. Solitary gallstone is noted. 3. Sigmoid diverticulosis without evidence of diverticulitis. 4. Mild bilateral pleural effusions are noted with adjacent subsegmental atelectasis. 5. Aortic atherosclerosis. Aortic Atherosclerosis (ICD10-I70.0). Electronically Signed   By: Marijo Conception M.D.    On: 10/16/2020 13:28   DG Abd 1 View  Result Date: 10/15/2020 CLINICAL DATA:  Constipation. EXAM: ABDOMEN - 1 VIEW COMPARISON:  10/14/2020 FINDINGS: Mild gaseous distention of the colon is likely consistent with a component of ileus. No significant retained stool. No small bowel dilatation. IMPRESSION: Probable mild colonic ileus. Electronically Signed   By: Aletta Edouard M.D.   On: 10/15/2020 09:37   DG Abd 1 View  Result Date: 10/14/2020 CLINICAL DATA:  Diarrhea EXAM: ABDOMEN - 1 VIEW COMPARISON:  None. FINDINGS: The bowel gas pattern is normal. No radio-opaque calculi or other significant radiographic abnormality are seen. IMPRESSION: Negative. Electronically Signed   By: Rolm Baptise M.D.   On: 10/14/2020 13:02   MR ANGIO HEAD WO CONTRAST  Result Date: 10/14/2020 CLINICAL DATA:  TIA. EXAM: MRI HEAD WITHOUT CONTRAST MRA HEAD WITHOUT CONTRAST TECHNIQUE: Multiplanar, multiecho pulse sequences of the brain and surrounding structures were obtained without intravenous contrast. Angiographic images of the head were obtained using MRA technique without contrast. COMPARISON:  CT head 10/14/2020 FINDINGS: MRI HEAD FINDINGS Brain: Negative for acute infarct. Small area of T2 shine through in the left frontal white matter. No areas of restricted diffusion. Mild white matter changes with scattered small periventricular deep white matter hyperintensities bilaterally. Brainstem normal. Negative for hemorrhage or mass. Negative for hydrocephalus. Cerebral volume normal. Vascular: Normal arterial flow voids Skull and upper cervical spine: No focal skeletal abnormality. Sinuses/Orbits: Mild mucosal edema paranasal sinuses and right mastoid sinus. Normal orbit Other: None MRA HEAD FINDINGS Both vertebral arteries are widely patent and normal. Basilar widely patent. AICA, PICA widely patent. Superior cerebellar and posterior cerebral arteries normal bilaterally. Internal carotid artery widely patent bilaterally.  Anterior and middle cerebral arteries normal bilaterally. Negative for cerebral aneurysm. IMPRESSION: 1. Negative for acute infarct. Mild chronic microvascular ischemic change in the white matter.  2. Negative MRA head Electronically Signed   By: Franchot Gallo M.D.   On: 10/14/2020 16:24   MR BRAIN WO CONTRAST  Result Date: 10/14/2020 CLINICAL DATA:  TIA. EXAM: MRI HEAD WITHOUT CONTRAST MRA HEAD WITHOUT CONTRAST TECHNIQUE: Multiplanar, multiecho pulse sequences of the brain and surrounding structures were obtained without intravenous contrast. Angiographic images of the head were obtained using MRA technique without contrast. COMPARISON:  CT head 10/14/2020 FINDINGS: MRI HEAD FINDINGS Brain: Negative for acute infarct. Small area of T2 shine through in the left frontal white matter. No areas of restricted diffusion. Mild white matter changes with scattered small periventricular deep white matter hyperintensities bilaterally. Brainstem normal. Negative for hemorrhage or mass. Negative for hydrocephalus. Cerebral volume normal. Vascular: Normal arterial flow voids Skull and upper cervical spine: No focal skeletal abnormality. Sinuses/Orbits: Mild mucosal edema paranasal sinuses and right mastoid sinus. Normal orbit Other: None MRA HEAD FINDINGS Both vertebral arteries are widely patent and normal. Basilar widely patent. AICA, PICA widely patent. Superior cerebellar and posterior cerebral arteries normal bilaterally. Internal carotid artery widely patent bilaterally. Anterior and middle cerebral arteries normal bilaterally. Negative for cerebral aneurysm. IMPRESSION: 1. Negative for acute infarct. Mild chronic microvascular ischemic change in the white matter. 2. Negative MRA head Electronically Signed   By: Franchot Gallo M.D.   On: 10/14/2020 16:24   US RENAL  Result Date: 10/15/2020 CLINICAL DATA:  Acute kidney injury EXAM: RENAL / URINARY TRACT ULTRASOUND COMPLETE COMPARISON:  Radiograph 10/15/2020 FINDINGS:  Right Kidney: Renal measurements: 12.2 x 5.6 x 5 cm = volume: 180.6 mL. Echogenicity within normal limits. No mass or hydronephrosis visualized. Left Kidney: Renal measurements: 11.9 x 6.2 x 4.2 cm = volume: 162.9 mL. Echogenicity within normal limits. No hydronephrosis. Exophytic cyst off the upper pole measuring 3 cm. Probable small parapelvic cyst within the mid to upper pole measuring 2 cm. Bladder: Decompressed which limits evaluation Other: Small free fluid in the right upper quadrant. Small pleural effusion. IMPRESSION: 1. No hydronephrosis. 2. Cysts within the left kidney 3. Small amount of free fluid in the abdomen. Right pleural effusion. Electronically Signed   By: Donavan Foil M.D.   On: 10/15/2020 18:47   DG Chest Port 1 View  Result Date: 10/17/2020 CLINICAL DATA:  Respiratory failure.  COVID. EXAM: PORTABLE CHEST 1 VIEW COMPARISON:  10/16/2020. FINDINGS: Heart size stable. Low lung volumes with persistent bibasilar infiltrates/edema. Small left pleural effusion most likely present. No pneumothorax. Degenerative change thoracic spine. IMPRESSION: Low lung volumes with persistent bibasilar infiltrates/edema. Small left pleural effusion most likely present. Similar findings noted on prior exam. Electronically Signed   By: Marcello Moores  Register   On: 10/17/2020 07:23   DG Chest Port 1 View  Result Date: 10/16/2020 CLINICAL DATA:  Shortness of breath, COVID EXAM: PORTABLE CHEST 1 VIEW COMPARISON:  10/15/2020 FINDINGS: No significant change in AP portable chest radiograph, likely with small bilateral layering pleural effusions. No new or focal airspace opacity. Heart and mediastinum are IMPRESSION: No significant change in AP portable chest radiograph, likely with small bilateral layering pleural effusions. No new or focal airspace opacity. Electronically Signed   By: Eddie Candle M.D.   On: 10/16/2020 09:00   DG Chest Port 1 View  Result Date: 10/15/2020 CLINICAL DATA:  COVID positive, dyspnea EXAM:  PORTABLE CHEST 1 VIEW COMPARISON:  Chest radiograph from one day prior. FINDINGS: Stable cardiomediastinal silhouette with top-normal heart size. No pneumothorax. Small left pleural effusion, stable. No right pleural effusion. No pulmonary edema.  Left retrocardiac opacity with increased volume loss. IMPRESSION: 1. Stable small left pleural effusion. 2. Left retrocardiac opacity with increased volume loss, indicating left lower lobe atelectasis with possible component of pneumonia. Electronically Signed   By: Ilona Sorrel M.D.   On: 10/15/2020 07:53   DG Chest Port 1 View  Result Date: 10/14/2020 CLINICAL DATA:  COVID positive EXAM: PORTABLE CHEST 1 VIEW COMPARISON:  06/30/2017 chest radiograph. FINDINGS: Stable cardiomediastinal silhouette with normal heart size. No pneumothorax. No pleural effusion. No overt pulmonary edema. Patchy left lung base opacity. IMPRESSION: Patchy left lung base opacity, suggesting aspiration or pneumonia. Electronically Signed   By: Ilona Sorrel M.D.   On: 10/14/2020 07:39   CT HEAD CODE STROKE WO CONTRAST  Result Date: 10/14/2020 CLINICAL DATA:  Code stroke.  Suspected TIA EXAM: CT HEAD WITHOUT CONTRAST TECHNIQUE: Contiguous axial images were obtained from the base of the skull through the vertex without intravenous contrast. COMPARISON:  None. FINDINGS: Brain: There is no mass, hemorrhage or extra-axial collection. The size and configuration of the ventricles and extra-axial CSF spaces are normal. The brain parenchyma is normal, without evidence of acute or chronic infarction. Vascular: No abnormal hyperdensity of the major intracranial arteries or dural venous sinuses. No intracranial atherosclerosis. Skull: The visualized skull base, calvarium and extracranial soft tissues are normal. Sinuses/Orbits: No fluid levels or advanced mucosal thickening of the visualized paranasal sinuses. No mastoid or middle ear effusion. The orbits are normal. ASPECTS [facility] Stroke Program Early  CT Score) - Ganglionic level infarction (caudate, lentiform nuclei, internal capsule, insula, M1-M3 cortex): 7 - Supraganglionic infarction (M4-M6 cortex): 3 Total score (0-10 with 10 being normal): 10 IMPRESSION: 1. Normal head CT. 2. ASPECTS is 10. 3. These results were communicated to Dr. Donnetta Simpers at 1:45 am on 10/14/2020 by telephone. Electronically Signed   By: Ulyses Jarred M.D.   On: 10/14/2020 01:46   VAS US CAROTID  Result Date: 10/15/2020 Carotid Arterial Duplex Study Indications:       Speech disturbance and Confusion, facial droop. Other Factors:     Covid-19, atrial fibrillation, CHF,. Limitations        Today's exam was limited due to Rapid atrial fibrillation and                    rapid, heavy breathing. Comparison Study:  No prior study Performing Technologist: Sharion Dove RVS  Examination Guidelines: A complete evaluation includes B-mode imaging, spectral Doppler, color Doppler, and power Doppler as needed of all accessible portions of each vessel. Bilateral testing is considered an integral part of a complete examination. Limited examinations for reoccurring indications may be performed as noted.  Right Carotid Findings: +----------+--------+--------+--------+------------------+------------------+           PSV cm/sEDV cm/sStenosisPlaque DescriptionComments           +----------+--------+--------+--------+------------------+------------------+ CCA Prox  104     38                                intimal thickening +----------+--------+--------+--------+------------------+------------------+ CCA Distal88      22                                intimal thickening +----------+--------+--------+--------+------------------+------------------+ ICA Prox  72      26                                                   +----------+--------+--------+--------+------------------+------------------+  ICA Distal93      35                                                    +----------+--------+--------+--------+------------------+------------------+ ECA       67      14                                                   +----------+--------+--------+--------+------------------+------------------+ +----------+--------+-------+--------+-------------------+           PSV cm/sEDV cmsDescribeArm Pressure (mmHG) +----------+--------+-------+--------+-------------------+ Subclavian171                                        +----------+--------+-------+--------+-------------------+ +---------+--------+--+--------+--+ VertebralPSV cm/s77EDV cm/s19 +---------+--------+--+--------+--+  Left Carotid Findings: +----------+--------+--------+--------+------------------+------------------+           PSV cm/sEDV cm/sStenosisPlaque DescriptionComments           +----------+--------+--------+--------+------------------+------------------+ CCA Prox  132     48                                intimal thickening +----------+--------+--------+--------+------------------+------------------+ CCA Distal175     43                                intimal thickening +----------+--------+--------+--------+------------------+------------------+ ICA Prox  119     33                                                   +----------+--------+--------+--------+------------------+------------------+ ICA Distal109     42                                                   +----------+--------+--------+--------+------------------+------------------+ ECA       108     10                                                   +----------+--------+--------+--------+------------------+------------------+ +----------+--------+--------+--------+-------------------+           PSV cm/sEDV cm/sDescribeArm Pressure (mmHG) +----------+--------+--------+--------+-------------------+ Subclavian212                                          +----------+--------+--------+--------+-------------------+ +---------+--------+---+--------+--+ VertebralPSV cm/s101EDV cm/s30 +---------+--------+---+--------+--+   Summary: Right Carotid: The extracranial vessels were near-normal with only minimal wall                thickening or plaque. Left Carotid: The extracranial vessels were near-normal with only minimal wall  thickening or plaque. Vertebrals:  Bilateral vertebral arteries demonstrate antegrade flow. Subclavians: Normal flow hemodynamics were seen in bilateral subclavian              arteries. *See table(s) above for measurements and observations.  Electronically signed by Antony Contras MD on 10/15/2020 at 2:20:13 PM.    Final    ECHOCARDIOGRAM LIMITED  Result Date: 10/16/2020    ECHOCARDIOGRAM LIMITED REPORT   Patient Name:   Cap Massi. Date of Exam: 10/16/2020 Medical Rec #:  [id number]             Height:       68.0 in Accession #:    [id number]            Weight:       209.7 lb Date of Birth:  1931-06-15             BSA:          2.085 m Patient Age:    72 years              BP:           111/85 mmHg Patient Gender: M                     HR:           97 bpm. Exam Location:  Inpatient Procedure: Limited Echo, Cardiac Doppler and Color Doppler Indications:    TIA  History:        Patient has prior history of Echocardiogram examinations, most                 recent 09/07/2019. CHF, TIA, Arrythmias:Atrial Fibrillation;                 Risk Factors:Hypertension. COVID+.  Sonographer:    Dustin Flock Referring Phys: [place]  1. Left ventricular ejection fraction, by estimation, is 55 to 60%. The left ventricle has normal function. The left ventricle has no regional wall motion abnormalities. Left ventricular diastolic function could not be evaluated.  2. Right ventricular systolic function is normal. The right ventricular size is normal. There is normal pulmonary artery systolic pressure. The  estimated right ventricular systolic pressure is 76.5 mmHg.  3. Left atrial size was mildly dilated.  4. Right atrial size was mildly dilated.  5. The mitral valve is normal in structure. No evidence of mitral valve regurgitation. No evidence of mitral stenosis.  6. The aortic valve is normal in structure. Aortic valve regurgitation is not visualized. No aortic stenosis is present.  7. The inferior vena cava is normal in size with greater than 50% respiratory variability, suggesting right atrial pressure of 3 mmHg. FINDINGS  Left Ventricle: Left ventricular ejection fraction, by estimation, is 55 to 60%. The left ventricle has normal function. The left ventricle has no regional wall motion abnormalities. The left ventricular internal cavity size was normal in size. There is  no left ventricular hypertrophy. Left ventricular diastolic function could not be evaluated. Left ventricular diastolic function could not be evaluated due to atrial fibrillation. Right Ventricle: The right ventricular size is normal. No increase in right ventricular wall thickness. Right ventricular systolic function is normal. There is normal pulmonary artery systolic pressure. The tricuspid regurgitant velocity is 2.76 m/s, and  with an assumed right atrial pressure of 3 mmHg, the estimated right ventricular systolic pressure is 46.5 mmHg. Left Atrium: Left atrial size was mildly dilated. Right  Atrium: Right atrial size was mildly dilated. Pericardium: There is no evidence of pericardial effusion. Mitral Valve: The mitral valve is normal in structure. No evidence of mitral valve stenosis. Tricuspid Valve: The tricuspid valve is normal in structure. Tricuspid valve regurgitation is not demonstrated. No evidence of tricuspid stenosis. Aortic Valve: The aortic valve is normal in structure. Aortic valve regurgitation is not visualized. No aortic stenosis is present. Pulmonic Valve: The pulmonic valve was normal in structure. Pulmonic valve  regurgitation is not visualized. No evidence of pulmonic stenosis. Aorta: The aortic root is normal in size and structure. Venous: The inferior vena cava is normal in size with greater than 50% respiratory variability, suggesting right atrial pressure of 3 mmHg. IAS/Shunts: No atrial level shunt detected by color flow Doppler. LEFT VENTRICLE PLAX 2D LVIDd:         5.40 cm Diastology LVIDs:         3.60 cm LV e' medial:    9.12 cm/s LV PW:         1.10 cm LV E/e' medial:  4.1 LV IVS:        1.00 cm LV e' lateral:   8.49 cm/s                        LV E/e' lateral: 4.4  RIGHT VENTRICLE RV S prime:     6.20 cm/s LEFT ATRIUM         Index LA diam:    3.20 cm 1.53 cm/m  MITRAL VALVE               TRICUSPID VALVE MV Area (PHT): 3.42 cm    TR Peak grad:   30.5 mmHg MV Decel Time: 222 msec    TR Vmax:        276.00 cm/s MV E velocity: 37.43 cm/s MV A velocity: 68.90 cm/s MV E/A ratio:  0.54 Mihai Croitoru MD Electronically signed by Sanda Klein MD Signature Date/Time: 10/16/2020/4:41:44 PM    Final

## 2020-10-17 NOTE — Progress Notes (Signed)
Cross-coverage note:   Patient confused, pulling at lines, getting out of bed, is high fall-risk. Non-pharmacologic and pharmacologic interventions have not been successful. Sitter had been ordered but informed there are none available. Plan to use soft-restraints for now, frequently reassess, and remove as soon as possible.

## 2020-10-17 NOTE — Progress Notes (Addendum)
[facility] Progress Note    Assessment/ Plan:   1. Acute renal failure - baseline cr 11.22 March 2020, now with severe AKI in the setting of multiple insults including COVID, diarrhea, and pancreatitis (also was taking entresto).  Severe AKI with Cr up to 8.6, falling UOP, and worsening hemodynamics in addition to bad metabolic acidosis.  Will start CRRT.  4K pre and dialysate, isotonic bicarb post, aggressively replete calcium and will likely need K repletion too.  I think a time- limited trial is reasonable (48-72 hrs).  If no real improvement, will need to re-address [place]   2. Acute pancreatitis: Noted on CT scan today at 1300.  GI has been consulted, he is NPO now, appreciate assistance.  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  Had a hospitalization at [facility] for pancreatitis 2016 when peripancreatic lymph node leading to dx first discovered. Lipase and lipid panel unremarkable  3. Hypocalcemia: likely related to #2- replete aggressively  4.  Diarrhea: likely  Due to #2 +/- COVID infection  5. Acute hypoxemic RF: likely aspiration PNA +/- COVID pneumonitis, getting Unasyn and doxycycline along with supportive care  6. COVID-19 infection: vaccinated and boosted- getting steroids  7. Acute encephalopathy: multifactorial, likely COVID +/- BUN in the 90s, Ct head on admit neg as were carotid dopplers  8.  Chronic systolic CHF: EF 0/7/37 on TTE 60-65% with no WMA  9.  Afib: hep and amiodarone gtts  10: hyponatremia: will correct with CRRT  11.  Dispo: critically ill with MSOF in ICU    Subjective:    Seen in room.  Moved to ICU, HD cath inserted, placed on levophed.  Confused and requiring restraints.  Starting CRRT today.  Had Afib with RVR overnight requiring amio gtt   Objective:   BP 93/61   Pulse (!) 117   Temp 98.6 F (37 C) (Axillary)   Resp (!) 22   Ht 5\' 8"  (1.727 m)   Wt 108 kg    SpO2 97%   BMI 36.20 kg/m   Intake/Output Summary (Last 24 hours) at 10/17/2020 1255 Last data filed at 10/17/2020 1200 Gross per 24 hour  Intake 4104.45 ml  Output 10 ml  Net 4094.45 ml   Weight change:   Physical Exam: Gen: older gentleman, confused, mildly agitated CVS: tachycardic Resp: tachypneic Abd: somewhat distended, nontender to palpation Ext: 1+ anasarca NEURO: confused and agitated  Imaging: CT ABDOMEN PELVIS WO CONTRAST  Result Date: 10/16/2020 CLINICAL DATA:  Acute generalized abdominal pain. EXAM: CT ABDOMEN AND PELVIS WITHOUT CONTRAST TECHNIQUE: Multidetector CT imaging of the abdomen and pelvis was performed following the standard protocol without IV contrast. COMPARISON:  None. FINDINGS: Lower chest: Mild bilateral pleural effusions are noted with adjacent subsegmental atelectasis. Hepatobiliary: Solitary gallstone is noted. No biliary dilatation is noted. Mild amount of fluid is noted around the liver. No focal abnormality is noted in the liver. Pancreas: Large amount of inflammatory changes are noted involving the pancreas consistent with severe acute pancreatitis. Fluid is seen extending into both pericolic gutters. No definite pseudocyst formation is seen at this time. Spleen: Mild amount of fluid is noted around the spleen. Adrenals/Urinary Tract: Adrenal glands appear normal. Left renal cyst is noted. No hydronephrosis or renal obstruction is noted. No renal or ureteral calculi are noted. Urinary bladder is decompressed secondary to Foley catheter. Stomach/Bowel: Stomach appears normal. There is no evidence of bowel obstruction. Sigmoid diverticulosis  is noted without evidence of diverticulitis. Vascular/Lymphatic: Aortic atherosclerosis. No enlarged abdominal or pelvic lymph nodes. Reproductive: Prostate is unremarkable. Other: No abdominal wall hernia or abnormality. No abdominopelvic ascites. Musculoskeletal: No acute or significant osseous findings. IMPRESSION: 1.  Large amount of inflammatory changes are noted involving the pancreas consistent with severe acute pancreatitis. Fluid is seen extending into both pericolic gutters as well as around the liver and spleen. No definite pseudocyst formation is seen at this time. 2. Solitary gallstone is noted. 3. Sigmoid diverticulosis without evidence of diverticulitis. 4. Mild bilateral pleural effusions are noted with adjacent subsegmental atelectasis. 5. Aortic atherosclerosis. Aortic Atherosclerosis (ICD10-I70.0). Electronically Signed   By: Marijo Conception M.D.   On: 10/16/2020 13:28   US RENAL  Result Date: 10/15/2020 CLINICAL DATA:  Acute kidney injury EXAM: RENAL / URINARY TRACT ULTRASOUND COMPLETE COMPARISON:  Radiograph 10/15/2020 FINDINGS: Right Kidney: Renal measurements: 12.2 x 5.6 x 5 cm = volume: 180.6 mL. Echogenicity within normal limits. No mass or hydronephrosis visualized. Left Kidney: Renal measurements: 11.9 x 6.2 x 4.2 cm = volume: 162.9 mL. Echogenicity within normal limits. No hydronephrosis. Exophytic cyst off the upper pole measuring 3 cm. Probable small parapelvic cyst within the mid to upper pole measuring 2 cm. Bladder: Decompressed which limits evaluation Other: Small free fluid in the right upper quadrant. Small pleural effusion. IMPRESSION: 1. No hydronephrosis. 2. Cysts within the left kidney 3. Small amount of free fluid in the abdomen. Right pleural effusion. Electronically Signed   By: Donavan Foil M.D.   On: 10/15/2020 18:47   DG CHEST PORT 1 VIEW  Result Date: 10/17/2020 CLINICAL DATA:  Central line placement EXAM: PORTABLE CHEST 1 VIEW COMPARISON:  Earlier today FINDINGS: Right IJ line with tip at the SVC. Prominent upper mediastinal width with biapical density that is stable; no suspected line related hematoma. There is hazy bilateral chest opacity in the setting of COVID infection. Stable heart size. Negative for pneumothorax. IMPRESSION: New central line with tip at the SVC.  Negative  for pneumothorax. Electronically Signed   By: Monte Fantasia M.D.   On: 10/17/2020 10:42   DG Chest Port 1 View  Result Date: 10/17/2020 CLINICAL DATA:  Respiratory failure.  COVID. EXAM: PORTABLE CHEST 1 VIEW COMPARISON:  10/16/2020. FINDINGS: Heart size stable. Low lung volumes with persistent bibasilar infiltrates/edema. Small left pleural effusion most likely present. No pneumothorax. Degenerative change thoracic spine. IMPRESSION: Low lung volumes with persistent bibasilar infiltrates/edema. Small left pleural effusion most likely present. Similar findings noted on prior exam. Electronically Signed   By: Marcello Moores  Register   On: 10/17/2020 07:23   DG Chest Port 1 View  Result Date: 10/16/2020 CLINICAL DATA:  Shortness of breath, COVID EXAM: PORTABLE CHEST 1 VIEW COMPARISON:  10/15/2020 FINDINGS: No significant change in AP portable chest radiograph, likely with small bilateral layering pleural effusions. No new or focal airspace opacity. Heart and mediastinum are IMPRESSION: No significant change in AP portable chest radiograph, likely with small bilateral layering pleural effusions. No new or focal airspace opacity. Electronically Signed   By: Eddie Candle M.D.   On: 10/16/2020 09:00   VAS US CAROTID  Result Date: 10/15/2020 Carotid Arterial Duplex Study Indications:       Speech disturbance and Confusion, facial droop. Other Factors:     Covid-19, atrial fibrillation, CHF,. Limitations        Today's exam was limited due to Rapid atrial fibrillation and  rapid, heavy breathing. Comparison Study:  No prior study Performing Technologist: Sharion Dove RVS  Examination Guidelines: A complete evaluation includes B-mode imaging, spectral Doppler, color Doppler, and power Doppler as needed of all accessible portions of each vessel. Bilateral testing is considered an integral part of a complete examination. Limited examinations for reoccurring indications may be performed as noted.  Right  Carotid Findings: +----------+--------+--------+--------+------------------+------------------+           PSV cm/sEDV cm/sStenosisPlaque DescriptionComments           +----------+--------+--------+--------+------------------+------------------+ CCA Prox  104     38                                intimal thickening +----------+--------+--------+--------+------------------+------------------+ CCA Distal88      22                                intimal thickening +----------+--------+--------+--------+------------------+------------------+ ICA Prox  72      26                                                   +----------+--------+--------+--------+------------------+------------------+ ICA Distal93      35                                                   +----------+--------+--------+--------+------------------+------------------+ ECA       67      14                                                   +----------+--------+--------+--------+------------------+------------------+ +----------+--------+-------+--------+-------------------+           PSV cm/sEDV cmsDescribeArm Pressure (mmHG) +----------+--------+-------+--------+-------------------+ Subclavian171                                        +----------+--------+-------+--------+-------------------+ +---------+--------+--+--------+--+ VertebralPSV cm/s77EDV cm/s19 +---------+--------+--+--------+--+  Left Carotid Findings: +----------+--------+--------+--------+------------------+------------------+           PSV cm/sEDV cm/sStenosisPlaque DescriptionComments           +----------+--------+--------+--------+------------------+------------------+ CCA Prox  132     48                                intimal thickening +----------+--------+--------+--------+------------------+------------------+ CCA Distal175     43                                intimal thickening  +----------+--------+--------+--------+------------------+------------------+ ICA Prox  119     33                                                   +----------+--------+--------+--------+------------------+------------------+ ICA  Distal109     42                                                   +----------+--------+--------+--------+------------------+------------------+ ECA       108     10                                                   +----------+--------+--------+--------+------------------+------------------+ +----------+--------+--------+--------+-------------------+           PSV cm/sEDV cm/sDescribeArm Pressure (mmHG) +----------+--------+--------+--------+-------------------+ Subclavian212                                         +----------+--------+--------+--------+-------------------+ +---------+--------+---+--------+--+ VertebralPSV cm/s101EDV cm/s30 +---------+--------+---+--------+--+   Summary: Right Carotid: The extracranial vessels were near-normal with only minimal wall                thickening or plaque. Left Carotid: The extracranial vessels were near-normal with only minimal wall               thickening or plaque. Vertebrals:  Bilateral vertebral arteries demonstrate antegrade flow. Subclavians: Normal flow hemodynamics were seen in bilateral subclavian              arteries. *See table(s) above for measurements and observations.  Electronically signed by Antony Contras MD on 10/15/2020 at 2:20:13 PM.    Final    ECHOCARDIOGRAM LIMITED  Result Date: 10/16/2020    ECHOCARDIOGRAM LIMITED REPORT   Patient Name:   Raymond Hurley. Date of Exam: 10/16/2020 Medical Rec #:  [id number]             Height:       68.0 in Accession #:    [id number]            Weight:       209.7 lb Date of Birth:  [birth year]             BSA:          2.085 m Patient Age:    87 years              BP:           111/85 mmHg Patient Gender: M                     HR:           97  bpm. Exam Location:  Inpatient Procedure: Limited Echo, Cardiac Doppler and Color Doppler Indications:    TIA  History:        Patient has prior history of Echocardiogram examinations, most                 recent 09/07/2019. CHF, TIA, Arrythmias:Atrial Fibrillation;                 Risk Factors:Hypertension. COVID+.  Sonographer:    Dustin Flock Referring Phys: [place]  1. Left ventricular ejection fraction, by estimation, is 55 to 60%. The left ventricle has normal function. The left ventricle has no regional wall motion abnormalities. Left ventricular diastolic function could  not be evaluated.  2. Right ventricular systolic function is normal. The right ventricular size is normal. There is normal pulmonary artery systolic pressure. The estimated right ventricular systolic pressure is 14.4 mmHg.  3. Left atrial size was mildly dilated.  4. Right atrial size was mildly dilated.  5. The mitral valve is normal in structure. No evidence of mitral valve regurgitation. No evidence of mitral stenosis.  6. The aortic valve is normal in structure. Aortic valve regurgitation is not visualized. No aortic stenosis is present.  7. The inferior vena cava is normal in size with greater than 50% respiratory variability, suggesting right atrial pressure of 3 mmHg. FINDINGS  Left Ventricle: Left ventricular ejection fraction, by estimation, is 55 to 60%. The left ventricle has normal function. The left ventricle has no regional wall motion abnormalities. The left ventricular internal cavity size was normal in size. There is  no left ventricular hypertrophy. Left ventricular diastolic function could not be evaluated. Left ventricular diastolic function could not be evaluated due to atrial fibrillation. Right Ventricle: The right ventricular size is normal. No increase in right ventricular wall thickness. Right ventricular systolic function is normal. There is normal pulmonary artery systolic pressure.  The tricuspid regurgitant velocity is 2.76 m/s, and  with an assumed right atrial pressure of 3 mmHg, the estimated right ventricular systolic pressure is 81.8 mmHg. Left Atrium: Left atrial size was mildly dilated. Right Atrium: Right atrial size was mildly dilated. Pericardium: There is no evidence of pericardial effusion. Mitral Valve: The mitral valve is normal in structure. No evidence of mitral valve stenosis. Tricuspid Valve: The tricuspid valve is normal in structure. Tricuspid valve regurgitation is not demonstrated. No evidence of tricuspid stenosis. Aortic Valve: The aortic valve is normal in structure. Aortic valve regurgitation is not visualized. No aortic stenosis is present. Pulmonic Valve: The pulmonic valve was normal in structure. Pulmonic valve regurgitation is not visualized. No evidence of pulmonic stenosis. Aorta: The aortic root is normal in size and structure. Venous: The inferior vena cava is normal in size with greater than 50% respiratory variability, suggesting right atrial pressure of 3 mmHg. IAS/Shunts: No atrial level shunt detected by color flow Doppler. LEFT VENTRICLE PLAX 2D LVIDd:         5.40 cm Diastology LVIDs:         3.60 cm LV e' medial:    9.12 cm/s LV PW:         1.10 cm LV E/e' medial:  4.1 LV IVS:        1.00 cm LV e' lateral:   8.49 cm/s                        LV E/e' lateral: 4.4  RIGHT VENTRICLE RV S prime:     6.20 cm/s LEFT ATRIUM         Index LA diam:    3.20 cm 1.53 cm/m  MITRAL VALVE               TRICUSPID VALVE MV Area (PHT): 3.42 cm    TR Peak grad:   30.5 mmHg MV Decel Time: 222 msec    TR Vmax:        276.00 cm/s MV E velocity: 37.43 cm/s MV A velocity: 68.90 cm/s MV E/A ratio:  0.54 Mihai Croitoru MD Electronically signed by Sanda Klein MD Signature Date/Time: 10/16/2020/4:41:44 PM    Final     Labs: BMET Recent Labs  Lab 10/14/20  6503 10/14/20 0129 10/14/20 0909 10/15/20 0445 10/16/20 0949 10/17/20 0443  NA 130* 130* 129* 131* 130* 127*  K  3.8 3.8 3.4* 3.4* 4.0 3.8  CL 96* 96* 97* 101 98 98  CO2 18*  --  19* 13* 10* 11*  GLUCOSE 145* 138* 103* 107* 111* 173*  BUN 57* 59* 56* 72* 98* 108*  CREATININE 5.89* 5.60* 5.08* 6.34* 7.98* 8.62*  CALCIUM 4.5*  --  4.5* 5.1* 5.3* 5.4*  PHOS  --   --   --  6.1*  --   --    CBC Recent Labs  Lab 10/14/20 0123 10/14/20 0129 10/15/20 0445 10/16/20 0949 10/17/20 0443  WBC 30.6*  --  42.7* 50.7* 46.4*  NEUTROABS 19.2*  --  30.5* 39.1* 34.6*  HGB 12.5* 13.6 11.2* 10.9* 9.2*  HCT 36.9* 40.0 34.3* 31.9* 26.4*  MCV 107.9*  --  107.9* 105.3* 104.3*  PLT 162  --  157 170 151    Medications:    . albuterol  2 puff Inhalation Q6H  . vitamin C  500 mg Oral Daily  . Chlorhexidine Gluconate Cloth  6 each Topical Daily  . Gerhardt's butt cream   Topical TID  . methylPREDNISolone (SOLU-MEDROL) injection  60 mg Intravenous Q12H  . pantoprazole (PROTONIX) IV  40 mg Intravenous Q24H  . Pedialyte  1,000 mL Oral Q4H  . QUEtiapine  25 mg Oral BID  . sodium chloride flush  3 mL Intravenous Q12H  . zinc sulfate  220 mg Oral Daily      Madelon Lips, MD 10/17/2020, 12:55 PM

## 2020-10-17 NOTE — Progress Notes (Signed)
Patient confused, pulling IV lines, got out of bed with  IV lines pulling along, impulsive and almost fell 2x. Reasoning with patient by all staffs and medication interventions have not been successful. Pt called his wife and I spoke to her and she acknowledged pt behavior not reasonable. Dr Myna Hidalgo MD informed and he placed Safety Sitter order but no staff available. Dr Myna Hidalgo MD informed again about pt behavior and  soft-restraints was ordered., Restraint has been initiated and pt is resting comfortably now. Wife has been notified againl via phone. Will continue to monitor pt q2.

## 2020-10-17 NOTE — Progress Notes (Addendum)
NAME:  Raymond Hurley., MRN:  [id number], DOB:  26-Mar-1931, LOS: 3 ADMISSION DATE:  10/14/2020, CONSULTATION DATE:  10/14/2020 REFERRING MD:  [place] [facility], CHIEF COMPLAINT:  Hypotension.    HPI/course in hospital   87 year old man who is hypotensive in the context of acute diarrhea on 1/1 was admitted w/ dx of COVID and acute dehydration from acute diarrheal illness.  Of note he was fully vaccinated and had received his booster.  Critical care was consulted given hypotension however this responded to IV hydration and supportive care. Hospital course from 1/1 through 1/3: Subsequently found to have aspiration pneumonia,  And, worsening delirium, and multiple organ failure, on 1/3  had worsening work of breathing more confusion, and CT of abdomen showed asymptomatic pancreatitis.  Given advanced age and multiple organ dysfunction in addition to concern about further clinical decline critical care asked to reevaluate  1/4 > hypotension and A.fib RVR.  Transferring to ICU.  Likely starting CRRT.  Past Medical History  Atrial fibrillation, melanoma, vitamin B12 deficiency.  Systolic cardiomyopathy. Metastatic melanoma with mets to pancreas (followed at [facility])    Interim history/subjective:  Hypotension and A.fib RVR early AM. Given fluids and albumin which improved pressures; however, pressure then tanked after amiodarone started. Phenylephrine started and pt transferring to ICU.  Objective   Blood pressure (!) 70/44, pulse (!) 128, temperature 98.6 F (37 C), temperature source Axillary, resp. rate (!) 30, height 5\' 8"  (1.727 m), weight 95.1 kg, SpO2 99 %.        Intake/Output Summary (Last 24 hours) at 10/17/2020 0909 Last data filed at 10/17/2020 0902 Gross per 24 hour  Intake 562.46 ml  Output -  Net 562.46 ml   Filed Weights   10/14/20 0153  Weight: 95.1 kg    Examination: deferred, to be performed by MD upon arrival to ICU.   Assessment & Plan:   Acute renal failure  with MIXED Picture of both anion and non-anion gap acidosis -  suspect all secondary to recent diarrheal illness and volume depletion -Has been seen by nephrology on 1/2 recommending supportive care, IV hydration and holding Entresto, possible CRRT Plan Continue bicarb gtt Transferring to ICU, will likely start CRRT today (will need HD cath placed).  Pt and wife in agreement to short trial of dialysis to see if he will turn around (not a long term candidate) Renal dose medications Serial chemistries  Hypocalcemia - 2/2 above + pancreatitis. Plan Additional 8g Ca gluconate today.  Acute hypoxic respiratory failure secondary to aspiration pneumonia Plan Continue supplemental oxygen Continue unasyn Aspiration precautions Follow CXR  Systolic cardiomyopathy with hx PAF - now in A.fib with RVR.  Echo 1/3 with EF 55 - 60% Plan Continue heparin gtt Continue amio Supportive care  COVID-19 infection Plan Day #4 systemic steroids Not a candidate for baricitinib or remdesivir  Acute metabolic encephalopathy,  Multifactorial secondary to metabolic acidosis, uremia, possible sepsis Plan Supportive care, CRRT likely today  Acute pancreatitis - unclear etiology.  Lipase, trigs, Tbili normal.  Denies EtOH use.  Of note, does have a hx of metastatic melanoma with mets to pancreas, s/p 30 infusions of Pembrolizumab in July 2018.Marland Kitchen Plan Continue supportive care GI following from a distance, recommending MRI and MRCP once AKI has resolved   Best practice  Disposition: Transfer to ICU Code status: full code    CC time: 40 min.   Montey Hora, [facility] For  pager details, please see AMION 10/17/2020, 9:32 AM   PCCM:  87 yo M, covid +, ARF, hypotension, worsening acidosis. Transferred to the ICU for management. Line placement and CVVHD   BP (!) 87/46   Pulse (!) 107   Temp 98.6 F (37 C) (Axillary)   Resp (!) 22   Ht 5\' 8"  (1.727 m)   Wt  95.1 kg   SpO2 96%   BMI 31.88 kg/m   Gen: elderly male, resting in bed, confused  HENT: tracking, opens eyes to stimulation  Heart: RRR s1 s2  Lungs: no crackles no wheeze  ABd: soft, nt nd   Labs reviewed   A:  COVID19 PNA  AHRF  Acute renal failure, now in need of CVVHD  Worsening Acute metabolic acidosis  Shock likely related to acidosis  Afib RVR  Chronic systolic heart failure  ?pancreatits, h/o of this due to metatstatic melanoma   P: ICU admit  CVL placement HD cath Pressors, NEPI to maintain MAP >77mmHg  Start CVVHD  Systemic steroids   Overall prognosis is guarded   I have consulted palliative care   This patient is critically ill with multiple organ system failure; which, requires frequent high complexity decision making, assessment, support, evaluation, and titration of therapies. This was completed through the application of advanced monitoring technologies and extensive interpretation of multiple databases. During this encounter critical care time was devoted to patient care services described in this note for 33 minutes.  Garner Nash, DO [facility] 10/17/2020 11:39 AM

## 2020-10-17 NOTE — Progress Notes (Signed)
Progress Note  Patient Name: Raymond Hurley. Date of Encounter: 10/17/2020  [facility] Cardiologist: Kirk Ruths, MD   Subjective   Overall condition has deteriorated and he was transferred to the ICU due to hypotension/shock.  Multiple metabolic abnormalities/acidosis. Undergoing placement of a temporary IJ dialysis catheter. Remains disoriented. In atrial fibrillation with fair ventricular rate control, typically in the 100-110 bpm range.  Inpatient Medications    Scheduled Meds: . albuterol  2 puff Inhalation Q6H  . vitamin C  500 mg Oral Daily  . Chlorhexidine Gluconate Cloth  6 each Topical Daily  . Gerhardt's butt cream   Topical TID  . methylPREDNISolone (SOLU-MEDROL) injection  60 mg Intravenous Q12H  . metoprolol tartrate  12.5 mg Oral TID  . pantoprazole (PROTONIX) IV  40 mg Intravenous Q24H  . Pedialyte  1,000 mL Oral Q4H  . QUEtiapine  25 mg Oral BID  . sodium chloride flush  3 mL Intravenous Q12H  . zinc sulfate  220 mg Oral Daily   Continuous Infusions: .  prismasol BGK 4/2.5    . amiodarone 60 mg/hr (10/17/20 0823)   Followed by  . amiodarone    . ampicillin-sulbactam (UNASYN) IV 3 g (10/16/20 1011)  . calcium gluconate    . heparin    . norepinephrine (LEVOPHED) Adult infusion 2 mcg/min (10/17/20 1053)  . prismasol BGK 4/2.5    . sodium bicarbonate (isotonic) 1000 mL infusion     PRN Meds: acetaminophen, chlorpheniramine-HYDROcodone, guaiFENesin-dextromethorphan, haloperidol lactate, heparin, heparin, loperamide, melatonin, polyethylene glycol, triamcinolone   Vital Signs    Vitals:   10/17/20 0900 10/17/20 0905 10/17/20 0910 10/17/20 1000  BP: (!) 76/19 (!) 71/39 (!) 71/32 (!) 76/50  Pulse: (!) 119 (!) 123 (!) 123 (!) 116  Resp:      Temp:      TempSrc:      SpO2: 100% 100% 100% 100%  Weight:      Height:        Intake/Output Summary (Last 24 hours) at 10/17/2020 1056 Last data filed at 10/17/2020 0902 Gross per 24 hour  Intake  1262.46 ml  Output -  Net 1262.46 ml   Last 3 Weights 10/14/2020 07/05/2020 06/13/2020  Weight (lbs) 209 lb 10.5 oz 209 lb 9.6 oz 212 lb  Weight (kg) 95.1 kg 95.074 kg 96.163 kg      Telemetry    Atrial fibrillation with mild RVR- Personally Reviewed  ECG    Atrial fibrillation with rapid ventricular response, right bundle branch block, left anterior fascicular block- Personally Reviewed  Physical Exam  Appears acutely ill GEN: No acute distress.   Neck:  Unable to evaluate JVD Cardiac:  Irregular, no murmurs, rubs, or gallops.  Respiratory:  Bilateral rhonchi GI:  Unable to examine MS: No edema; No deformity. Neuro:  Nonfocal  Psych: Normal affect   Labs    High Sensitivity Troponin:   Recent Labs  Lab 10/14/20 0144 10/14/20 0344  TROPONINIHS 50* 61*      Chemistry Recent Labs  Lab 10/15/20 0445 10/16/20 0949 10/17/20 0443  NA 131* 130* 127*  K 3.4* 4.0 3.8  CL 101 98 98  CO2 13* 10* 11*  GLUCOSE 107* 111* 173*  BUN 72* 98* 108*  CREATININE 6.34* 7.98* 8.62*  CALCIUM 5.1* 5.3* 5.4*  PROT 4.2* 5.5* 4.9*  ALBUMIN 2.1* 2.5* 2.3*  AST 65* 54* 50*  ALT 48* 43 40  ALKPHOS 53 66 58  BILITOT 0.6 0.5 1.1  GFRNONAA 8*  6* 5*  ANIONGAP 17* 22* 18*     Hematology Recent Labs  Lab 10/15/20 0445 10/16/20 0949 10/17/20 0443  WBC 42.7* 50.7* 46.4*  RBC 3.18* 3.03* 2.53*  HGB 11.2* 10.9* 9.2*  HCT 34.3* 31.9* 26.4*  MCV 107.9* 105.3* 104.3*  MCH 35.2* 36.0* 36.4*  MCHC 32.7 34.2 34.8  RDW 14.3 14.9 15.0  PLT 157 170 151    BNP Recent Labs  Lab 10/15/20 0445 10/16/20 0953 10/17/20 0443  BNP 238.6* 811.3* 262.0*     DDimer  Recent Labs  Lab 10/15/20 0445 10/16/20 0949 10/17/20 0443  DDIMER 7.77* 10.70* 9.79*     Radiology    CT ABDOMEN PELVIS WO CONTRAST  Result Date: 10/16/2020 CLINICAL DATA:  Acute generalized abdominal pain. EXAM: CT ABDOMEN AND PELVIS WITHOUT CONTRAST TECHNIQUE: Multidetector CT imaging of the abdomen and pelvis was  performed following the standard protocol without IV contrast. COMPARISON:  None. FINDINGS: Lower chest: Mild bilateral pleural effusions are noted with adjacent subsegmental atelectasis. Hepatobiliary: Solitary gallstone is noted. No biliary dilatation is noted. Mild amount of fluid is noted around the liver. No focal abnormality is noted in the liver. Pancreas: Large amount of inflammatory changes are noted involving the pancreas consistent with severe acute pancreatitis. Fluid is seen extending into both pericolic gutters. No definite pseudocyst formation is seen at this time. Spleen: Mild amount of fluid is noted around the spleen. Adrenals/Urinary Tract: Adrenal glands appear normal. Left renal cyst is noted. No hydronephrosis or renal obstruction is noted. No renal or ureteral calculi are noted. Urinary bladder is decompressed secondary to Foley catheter. Stomach/Bowel: Stomach appears normal. There is no evidence of bowel obstruction. Sigmoid diverticulosis is noted without evidence of diverticulitis. Vascular/Lymphatic: Aortic atherosclerosis. No enlarged abdominal or pelvic lymph nodes. Reproductive: Prostate is unremarkable. Other: No abdominal wall hernia or abnormality. No abdominopelvic ascites. Musculoskeletal: No acute or significant osseous findings. IMPRESSION: 1. Large amount of inflammatory changes are noted involving the pancreas consistent with severe acute pancreatitis. Fluid is seen extending into both pericolic gutters as well as around the liver and spleen. No definite pseudocyst formation is seen at this time. 2. Solitary gallstone is noted. 3. Sigmoid diverticulosis without evidence of diverticulitis. 4. Mild bilateral pleural effusions are noted with adjacent subsegmental atelectasis. 5. Aortic atherosclerosis. Aortic Atherosclerosis (ICD10-I70.0). Electronically Signed   By: Marijo Conception M.D.   On: 10/16/2020 13:28   US RENAL  Result Date: 10/15/2020 CLINICAL DATA:  Acute kidney  injury EXAM: RENAL / URINARY TRACT ULTRASOUND COMPLETE COMPARISON:  Radiograph 10/15/2020 FINDINGS: Right Kidney: Renal measurements: 12.2 x 5.6 x 5 cm = volume: 180.6 mL. Echogenicity within normal limits. No mass or hydronephrosis visualized. Left Kidney: Renal measurements: 11.9 x 6.2 x 4.2 cm = volume: 162.9 mL. Echogenicity within normal limits. No hydronephrosis. Exophytic cyst off the upper pole measuring 3 cm. Probable small parapelvic cyst within the mid to upper pole measuring 2 cm. Bladder: Decompressed which limits evaluation Other: Small free fluid in the right upper quadrant. Small pleural effusion. IMPRESSION: 1. No hydronephrosis. 2. Cysts within the left kidney 3. Small amount of free fluid in the abdomen. Right pleural effusion. Electronically Signed   By: Donavan Foil M.D.   On: 10/15/2020 18:47   DG CHEST PORT 1 VIEW  Result Date: 10/17/2020 CLINICAL DATA:  Central line placement EXAM: PORTABLE CHEST 1 VIEW COMPARISON:  Earlier today FINDINGS: Right IJ line with tip at the SVC. Prominent upper mediastinal width with biapical density that  is stable; no suspected line related hematoma. There is hazy bilateral chest opacity in the setting of COVID infection. Stable heart size. Negative for pneumothorax. IMPRESSION: New central line with tip at the SVC.  Negative for pneumothorax. Electronically Signed   By: Monte Fantasia M.D.   On: 10/17/2020 10:42   DG Chest Port 1 View  Result Date: 10/17/2020 CLINICAL DATA:  Respiratory failure.  COVID. EXAM: PORTABLE CHEST 1 VIEW COMPARISON:  10/16/2020. FINDINGS: Heart size stable. Low lung volumes with persistent bibasilar infiltrates/edema. Small left pleural effusion most likely present. No pneumothorax. Degenerative change thoracic spine. IMPRESSION: Low lung volumes with persistent bibasilar infiltrates/edema. Small left pleural effusion most likely present. Similar findings noted on prior exam. Electronically Signed   By: Marcello Moores  Register   On:  10/17/2020 07:23   DG Chest Port 1 View  Result Date: 10/16/2020 CLINICAL DATA:  Shortness of breath, COVID EXAM: PORTABLE CHEST 1 VIEW COMPARISON:  10/15/2020 FINDINGS: No significant change in AP portable chest radiograph, likely with small bilateral layering pleural effusions. No new or focal airspace opacity. Heart and mediastinum are IMPRESSION: No significant change in AP portable chest radiograph, likely with small bilateral layering pleural effusions. No new or focal airspace opacity. Electronically Signed   By: Eddie Candle M.D.   On: 10/16/2020 09:00   VAS US CAROTID  Result Date: 10/15/2020 Carotid Arterial Duplex Study Indications:       Speech disturbance and Confusion, facial droop. Other Factors:     Covid-19, atrial fibrillation, CHF,. Limitations        Today's exam was limited due to Rapid atrial fibrillation and                    rapid, heavy breathing. Comparison Study:  No prior study Performing Technologist: Sharion Dove RVS  Examination Guidelines: A complete evaluation includes B-mode imaging, spectral Doppler, color Doppler, and power Doppler as needed of all accessible portions of each vessel. Bilateral testing is considered an integral part of a complete examination. Limited examinations for reoccurring indications may be performed as noted.  Right Carotid Findings: +----------+--------+--------+--------+------------------+------------------+           PSV cm/sEDV cm/sStenosisPlaque DescriptionComments           +----------+--------+--------+--------+------------------+------------------+ CCA Prox  104     38                                intimal thickening +----------+--------+--------+--------+------------------+------------------+ CCA Distal88      22                                intimal thickening +----------+--------+--------+--------+------------------+------------------+ ICA Prox  72      26                                                    +----------+--------+--------+--------+------------------+------------------+ ICA Distal93      35                                                   +----------+--------+--------+--------+------------------+------------------+ ECA  67      14                                                   +----------+--------+--------+--------+------------------+------------------+ +----------+--------+-------+--------+-------------------+           PSV cm/sEDV cmsDescribeArm Pressure (mmHG) +----------+--------+-------+--------+-------------------+ Subclavian171                                        +----------+--------+-------+--------+-------------------+ +---------+--------+--+--------+--+ VertebralPSV cm/s77EDV cm/s19 +---------+--------+--+--------+--+  Left Carotid Findings: +----------+--------+--------+--------+------------------+------------------+           PSV cm/sEDV cm/sStenosisPlaque DescriptionComments           +----------+--------+--------+--------+------------------+------------------+ CCA Prox  132     48                                intimal thickening +----------+--------+--------+--------+------------------+------------------+ CCA Distal175     43                                intimal thickening +----------+--------+--------+--------+------------------+------------------+ ICA Prox  119     33                                                   +----------+--------+--------+--------+------------------+------------------+ ICA Distal109     42                                                   +----------+--------+--------+--------+------------------+------------------+ ECA       108     10                                                   +----------+--------+--------+--------+------------------+------------------+ +----------+--------+--------+--------+-------------------+           PSV cm/sEDV cm/sDescribeArm Pressure  (mmHG) +----------+--------+--------+--------+-------------------+ Subclavian212                                         +----------+--------+--------+--------+-------------------+ +---------+--------+---+--------+--+ VertebralPSV cm/s101EDV cm/s30 +---------+--------+---+--------+--+   Summary: Right Carotid: The extracranial vessels were near-normal with only minimal wall                thickening or plaque. Left Carotid: The extracranial vessels were near-normal with only minimal wall               thickening or plaque. Vertebrals:  Bilateral vertebral arteries demonstrate antegrade flow. Subclavians: Normal flow hemodynamics were seen in bilateral subclavian              arteries. *See table(s) above for measurements and observations.  Electronically signed by Antony Contras MD on 10/15/2020 at 2:20:13 PM.    Final    ECHOCARDIOGRAM  LIMITED  Result Date: 10/16/2020    ECHOCARDIOGRAM LIMITED REPORT   Patient Name:   Viktor Philipp. Date of Exam: 10/16/2020 Medical Rec #:  [id number]             Height:       68.0 in Accession #:    [id number]            Weight:       209.7 lb Date of Birth:  [birth year]             BSA:          2.085 m Patient Age:    87 years              BP:           111/85 mmHg Patient Gender: M                     HR:           97 bpm. Exam Location:  Inpatient Procedure: Limited Echo, Cardiac Doppler and Color Doppler Indications:    TIA  History:        Patient has prior history of Echocardiogram examinations, most                 recent 09/07/2019. CHF, TIA, Arrythmias:Atrial Fibrillation;                 Risk Factors:Hypertension. COVID+.  Sonographer:    Dustin Flock Referring Phys: [place]  1. Left ventricular ejection fraction, by estimation, is 55 to 60%. The left ventricle has normal function. The left ventricle has no regional wall motion abnormalities. Left ventricular diastolic function could not be evaluated.  2. Right ventricular  systolic function is normal. The right ventricular size is normal. There is normal pulmonary artery systolic pressure. The estimated right ventricular systolic pressure is 18.5 mmHg.  3. Left atrial size was mildly dilated.  4. Right atrial size was mildly dilated.  5. The mitral valve is normal in structure. No evidence of mitral valve regurgitation. No evidence of mitral stenosis.  6. The aortic valve is normal in structure. Aortic valve regurgitation is not visualized. No aortic stenosis is present.  7. The inferior vena cava is normal in size with greater than 50% respiratory variability, suggesting right atrial pressure of 3 mmHg. FINDINGS  Left Ventricle: Left ventricular ejection fraction, by estimation, is 55 to 60%. The left ventricle has normal function. The left ventricle has no regional wall motion abnormalities. The left ventricular internal cavity size was normal in size. There is  no left ventricular hypertrophy. Left ventricular diastolic function could not be evaluated. Left ventricular diastolic function could not be evaluated due to atrial fibrillation. Right Ventricle: The right ventricular size is normal. No increase in right ventricular wall thickness. Right ventricular systolic function is normal. There is normal pulmonary artery systolic pressure. The tricuspid regurgitant velocity is 2.76 m/s, and  with an assumed right atrial pressure of 3 mmHg, the estimated right ventricular systolic pressure is 63.1 mmHg. Left Atrium: Left atrial size was mildly dilated. Right Atrium: Right atrial size was mildly dilated. Pericardium: There is no evidence of pericardial effusion. Mitral Valve: The mitral valve is normal in structure. No evidence of mitral valve stenosis. Tricuspid Valve: The tricuspid valve is normal in structure. Tricuspid valve regurgitation is not demonstrated. No evidence of tricuspid stenosis. Aortic Valve: The aortic valve is normal in structure. Aortic valve  regurgitation is not  visualized. No aortic stenosis is present. Pulmonic Valve: The pulmonic valve was normal in structure. Pulmonic valve regurgitation is not visualized. No evidence of pulmonic stenosis. Aorta: The aortic root is normal in size and structure. Venous: The inferior vena cava is normal in size with greater than 50% respiratory variability, suggesting right atrial pressure of 3 mmHg. IAS/Shunts: No atrial level shunt detected by color flow Doppler. LEFT VENTRICLE PLAX 2D LVIDd:         5.40 cm Diastology LVIDs:         3.60 cm LV e' medial:    9.12 cm/s LV PW:         1.10 cm LV E/e' medial:  4.1 LV IVS:        1.00 cm LV e' lateral:   8.49 cm/s                        LV E/e' lateral: 4.4  RIGHT VENTRICLE RV S prime:     6.20 cm/s LEFT ATRIUM         Index LA diam:    3.20 cm 1.53 cm/m  MITRAL VALVE               TRICUSPID VALVE MV Area (PHT): 3.42 cm    TR Peak grad:   30.5 mmHg MV Decel Time: 222 msec    TR Vmax:        276.00 cm/s MV E velocity: 37.43 cm/s MV A velocity: 68.90 cm/s MV E/A ratio:  0.54 Yuri Fana MD Electronically signed by Sanda Klein MD Signature Date/Time: 10/16/2020/4:41:44 PM    Final     Cardiac Studies   Relevant CV Studies: Right/Left Cardiac Catheterization 04/27/2019:  Prox Cx to Mid Cx lesion is 20% stenosed.  Findings: Ao = 97/68 (82) LV = 97/8 RA = 21/9 (14) RV = 19/5 PA = 21/9 (14) PCW = 8 Fick cardiac output/index = 5.0/2.4 PVR = 1.5 WU SVR = 1243 Ao sat = 98% PA sat = 72%, 72%  Assessment: 1. Minimal coronary artery disease 2. Severe NICM 25-30% 3. Well-compensated hemodynamics  Plan/Discussion: Continue medical therapy.  _______________  Echocardiogram 09/07/2019: Impressions: 1. Left ventricular ejection fraction, by visual estimation, is 45%. The  left ventricle has mildly decreased function. There is mildly increased  left ventricular hypertrophy.  2. The left ventricle demonstrates global hypokinesis.  3. Left atrial size was normal.   4. Right atrial size was normal.  5. The mitral valve is normal in structure. Trace mitral valve  regurgitation. No evidence of mitral stenosis.  6. The tricuspid valve is normal in structure. Tricuspid valve  regurgitation is trivial.  7. The aortic valve is tricuspid. Aortic valve regurgitation is not  visualized. No evidence of aortic valve sclerosis or stenosis.  8. There is mild dilatation of the ascending aorta measuring 38 mm.  9. Global right ventricle has normal systolic function.The right  ventricular size is normal. No increase in right ventricular wall  thickness.  10. The inferior vena cava is normal in size with greater than 50%  respiratory variability, suggesting right atrial pressure of 3 mmHg.  11. The tricuspid regurgitant velocity is 2.12 m/s, and with an assumed  right atrial pressure of 3 mmHg, the estimated right ventricular systolic  pressure is normal at 21.0 mmHg.    Patient Profile     87 y.o. male with nonischemic cardiomyopathy with recovery of severely depressed left ventricular systolic  function (consistent with tachycardia cardiomyopathy), history of atrial flutter and now in persistent atrial fibrillation, minimal CAD by cardiac catheterization, acute renal failure, recent TIA in setting of suspected aspiration pneumonia, radiological findings suggestive of severe acute pancreatitis and recent COVID-19 infection.  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 Shock: Left ventricular systolic function has recovered; hypotension may be related to sepsis and to circulatory collapse in the setting of severe metabolic acidosis.  Prognosis appears grim 2. ARF: Anuric, plan to start emergency CRRT. 3. Persistent atrial fibrillation: Considering how ill he is, current degree of ventricular rate control appears appropriate.  On IV amiodarone.  On anticoagulation, transition to IV  heparin. 4.  Pancreatitis: Based on radiological abnormalities, but with normal lipase. 5. Sepsis/aspiration pneumonia: Felt to be the primary cause of his infection syndrome, rather than COVID-19 (patient is fully vaccinated and has received booster).  CRITICAL CARE Performed by: Dani Gobble Alanzo Lamb   Total critical care time: 40 minutes  Critical care time was exclusive of separately billable procedures and treating other patients.  Critical care was necessary to treat or prevent imminent or life-threatening deterioration.  Critical care was time spent personally by me on the following activities: development of treatment plan with patient and/or surrogate as well as nursing, discussions with consultants, evaluation of patient's response to treatment, examination of patient, obtaining history from patient or surrogate, ordering and performing treatments and interventions, ordering and review of laboratory studies, ordering and review of radiographic studies, pulse oximetry and re-evaluation of patient's condition.      For questions or updates, please contact [place] Please consult [email or web address] for contact info under        Signed, Sanda Klein, MD  10/17/2020, 10:56 AM

## 2020-10-17 NOTE — Progress Notes (Signed)
SLP Cancellation Note  Patient Details Name: Raymond Hurley. MRN: [id number] DOB: [birth year]   Cancelled treatment:       Reason Eval/Treat Not Completed: Medical issues which prohibited therapy; pt currently NPO, per RN mental status not appropriate for swallowing reassessment this date. Will continue to follow.    [place], CCC-SLP Acute Rehabilitation Services 10/17/2020, 1:05 PM

## 2020-10-17 NOTE — Progress Notes (Signed)
Initial Nutrition Assessment  DOCUMENTATION CODES:   Not applicable  INTERVENTION:   Plan for Cortrak placement tomorrow 1/5  Tube Feeding via Cortrak: Vital AF 1.2 at 65 ml/hr Pro-Source TF 45 mL BID Provides 139 g of protein, 1952 kcals, 1264 mL of free water Meets 100% estimated calorie and protein needs  Add B-complex with C to replace losses while on CRRT  NUTRITION DIAGNOSIS:   Inadequate oral intake related to acute illness as evidenced by NPO status.  GOAL:   Patient will meet greater than or equal to 90% of their needs  MONITOR:   TF tolerance,Labs,Weight trends,Diet advancement,Skin  REASON FOR ASSESSMENT:   Rounds (CRRT, plan for Cortrak)    ASSESSMENT:   87 yo male admitted with acute diarrhea on 1/1 with diagnosis of COVID and acute dehydration and subsequently found to have aspiration pneumonia. PMH includes metastatic melanoma with mets to pancreas (follwed at [facility]), B12 deficiency, systolic cardiomyopathy   1/01 Admitted  1/04 Transferred to ICU with hypotension, CRRT initiation  Started on levophed and phenylephrine. Started on sodium bicarb drip Noted plan to initiate CRRT today  CT abdomen with large amount of inflammatory changes concerning for severe acute pancreatitis  NPO  Discussed nutrition poc with Dr. Valeta Harms; received verbal order for Cortrak placement tomorrow when service available  Pt has order for Pedialyte 1000 mL q 4 hours by mouth. Consider discontinuing  Current weight 108 kg; admit wt 95 kg. No wt loss per weight encounters  Labs: sodium 127 (L), BUN 108, Creatinine 8.62 Meds: ascorbic acid, solumedrol    Diet Order:   Diet Order            Diet NPO time specified Except for: Sips with Meds  Diet effective now                 EDUCATION NEEDS:   Not appropriate for education at this time  Skin:  Skin Assessment: Reviewed RN Assessment  Last BM:  1/2  Height:   Ht Readings from Last 1 Encounters:   10/17/20 5\' 8"  (1.727 m)    Weight:   Wt Readings from Last 1 Encounters:  10/17/20 108 kg    BMI:  Body mass index is 36.2 kg/m.  Estimated Nutritional Needs:   Kcal:  1900-2300 kcals  Protein:  115-140 g  Fluid:  >/= 2L   Kerman Passey MS, RDN, LDN, CNSC Registered Dietitian III Clinical Nutrition RD Pager and On-Call Pager Number Located in [place]

## 2020-10-17 NOTE — Progress Notes (Signed)
[place] for Heparin Indication: atrial fibrillation  Allergies  Allergen Reactions  . Povidone-Iodine Swelling  . Tape Swelling  . Bee Venom Rash  . Covid-19 Mrna Vacc (Moderna) Hives and Rash    Rash came after booster shot. Pt was fine with 1st and 2nd    Patient Measurements: Height: 5\' 8"  (172.7 cm) Weight: 95.1 kg (209 lb 10.5 oz) IBW/kg (Calculated) : 68.4 Heparin Dosing Weight: 88.5 kg  Vital Signs: Temp: 98.6 F (37 C) (01/04 0715) Temp Source: Axillary (01/04 0715) BP: 74/46 (01/04 0835) Pulse Rate: 132 (01/04 0835)  Labs: Recent Labs    10/15/20 0445 10/16/20 0949 10/16/20 2000 10/16/20 2011 10/17/20 0443  HGB 11.2* 10.9*  --   --  9.2*  HCT 34.3* 31.9*  --   --  26.4*  PLT 157 170  --   --  151  APTT  --  >200*  --  72* 144*  HEPARINUNFRC  --   --  2.16*  --  2.06*  CREATININE 6.34* 7.98*  --   --  8.62*    Estimated Creatinine Clearance: 6.8 mL/min (A) (by C-G formula based on SCr of 8.62 mg/dL (H)).   Medical History: Past Medical History:  Diagnosis Date  . Atrial fibrillation ([place])   . Melanoma ([place])   . Vitamin B 12 deficiency     Medications:  Scheduled:  . albuterol  2 puff Inhalation Q6H  . vitamin C  500 mg Oral Daily  . Chlorhexidine Gluconate Cloth  6 each Topical Daily  . Gerhardt's butt cream   Topical TID  . methylPREDNISolone (SOLU-MEDROL) injection  60 mg Intravenous Q12H  . metoprolol tartrate  12.5 mg Oral TID  . pantoprazole  40 mg Oral Daily  . Pedialyte  1,000 mL Oral Q4H  . QUEtiapine  25 mg Oral BID  . sodium chloride flush  3 mL Intravenous Q12H  . zinc sulfate  220 mg Oral Daily    Assessment: Patient is a 89 yom that is being admitted for COVID and AKI. Patient takes Apixaban pta for aflutter. Pharmacy has been asked to dose heparin at this time due to concerns for taking oral meds.   Last dose of Apixaban was at 1030 on 10/15/20  APTT 144  Goal of Therapy:  aPTT  66-102 seconds Monitor platelets by anticoagulation protocol: Yes   Plan:   - Hold heparin x 1 hour and restart heparin drip @ 800 units/hr - Will monitor heparin using aPTT until aPTT and Heparin levels correlate - aPTT level in ~ 8 hours  - Monitor patient for s/s of bleeding and CBC while on heparin   Alanda Slim, PharmD, [facility] Clinical Pharmacist Please see AMION for all Pharmacists' Contact Phone Numbers 10/17/2020, 8:50 AM

## 2020-10-17 NOTE — Plan of Care (Signed)
  Problem: Education: Goal: Knowledge of disease or condition will improve Outcome: Not Progressing Goal: Knowledge of secondary prevention will improve Outcome: Not Progressing Goal: Knowledge of patient specific risk factors addressed and post discharge goals established will improve Outcome: Not Progressing   Problem: Coping: Goal: Will verbalize positive feelings about self Outcome: Not Progressing Goal: Will identify appropriate support needs Outcome: Not Progressing   Problem: Health Behavior/Discharge Planning: Goal: Ability to manage health-related needs will improve Outcome: Not Progressing

## 2020-10-17 NOTE — Progress Notes (Signed)
-  Chart reviewed.  Patient not seen.    -Patient transferred to ICU for A. fib and hypotension.  Labs reviewed.  Creatinine trending upwards.  8.6 to now.  AST 50 otherwise normal LFTs.  Leukocytosis with WBC count of 46.4.  Assessment  -----------------  -Abnormal CT scan showing large amount of inflammatory changes around the pancreas concerning for severe acute pancreatitis.  CT scan was without contrast.  Patient denied any abdominal pain.  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  Last CT scan in October 2021 at [facility] showed pancreatic calcification.    Recommendations  --------------------------  -Continue supportive care. -Recommend MRI MRCP with contrast once acute kidney injury and other acute issues are resolved.Marland Kitchen  MRI can be done in next 2 to 4 weeks.  - GI will follow from distance.   Otis Brace MD, [place] 10/17/2020, 11:32 AM  Contact #  [phone]

## 2020-10-17 NOTE — Procedures (Signed)
Central Venous Catheter Insertion Procedure Note  Raymond Hurley  [id number]  25-Feb-1931  Date:10/17/20  Time:10:23 AM   Provider Performing:Aaliyana Fredericks Jerilynn Mages Dewaine Oats   Procedure: Insertion of Non-tunneled Central Venous Catheter(36556)with US guidance (85631)    Indication(s) Hemodialysis  Consent Unable to obtain consent due to emergent nature of procedure.  Anesthesia Topical only with 1% lidocaine   Timeout Verified patient identification, verified procedure, site/side was marked, verified correct patient position, special equipment/implants available, medications/allergies/relevant history reviewed, required imaging and test results available.  Sterile Technique Maximal sterile technique including full sterile barrier drape, hand hygiene, sterile gown, sterile gloves, mask, hair covering, sterile ultrasound probe cover (if used).  Procedure Description Area of catheter insertion was cleaned with chlorhexidine and draped in sterile fashion.   With real-time ultrasound guidance a HD catheter was placed into the right internal jugular vein.  Nonpulsatile blood flow and easy flushing noted in all ports.  The catheter was sutured in place and sterile dressing applied.  Complications/Tolerance None; patient tolerated the procedure well. Chest X-ray is ordered to verify placement for internal jugular or subclavian cannulation.  Chest x-ray is not ordered for femoral cannulation.  EBL Minimal  Specimen(s) None

## 2020-10-18 ENCOUNTER — Inpatient Hospital Stay (HOSPITAL_COMMUNITY): Payer: Medicare Other

## 2020-10-18 DIAGNOSIS — Z515 Encounter for palliative care: Secondary | ICD-10-CM | POA: Diagnosis not present

## 2020-10-18 DIAGNOSIS — Z7189 Other specified counseling: Secondary | ICD-10-CM | POA: Diagnosis not present

## 2020-10-18 DIAGNOSIS — U071 COVID-19: Secondary | ICD-10-CM | POA: Diagnosis not present

## 2020-10-18 DIAGNOSIS — R6520 Severe sepsis without septic shock: Secondary | ICD-10-CM | POA: Diagnosis not present

## 2020-10-18 DIAGNOSIS — G9341 Metabolic encephalopathy: Secondary | ICD-10-CM

## 2020-10-18 DIAGNOSIS — I4891 Unspecified atrial fibrillation: Secondary | ICD-10-CM

## 2020-10-18 DIAGNOSIS — A419 Sepsis, unspecified organism: Secondary | ICD-10-CM | POA: Diagnosis not present

## 2020-10-18 DIAGNOSIS — N179 Acute kidney failure, unspecified: Secondary | ICD-10-CM | POA: Diagnosis not present

## 2020-10-18 DIAGNOSIS — J1282 Pneumonia due to coronavirus disease 2019: Secondary | ICD-10-CM | POA: Diagnosis not present

## 2020-10-18 LAB — RENAL FUNCTION PANEL
Albumin: 2.7 g/dL — ABNORMAL LOW (ref 3.5–5.0)
Anion gap: 15 (ref 5–15)
BUN: 44 mg/dL — ABNORMAL HIGH (ref 8–23)
CO2: 23 mmol/L (ref 22–32)
Calcium: 7.3 mg/dL — ABNORMAL LOW (ref 8.9–10.3)
Chloride: 96 mmol/L — ABNORMAL LOW (ref 98–111)
Creatinine, Ser: 3.91 mg/dL — ABNORMAL HIGH (ref 0.61–1.24)
GFR, Estimated: 14 mL/min — ABNORMAL LOW (ref >60)
Glucose, Bld: 190 mg/dL — ABNORMAL HIGH (ref 70–99)
Phosphorus: 4.2 mg/dL (ref 2.5–4.6)
Potassium: 3.6 mmol/L (ref 3.5–5.1)
Sodium: 134 mmol/L — ABNORMAL LOW (ref 135–145)

## 2020-10-18 LAB — COMPREHENSIVE METABOLIC PANEL
ALT: 42 U/L (ref 0–44)
AST: 51 U/L — ABNORMAL HIGH (ref 15–41)
Albumin: 2.9 g/dL — ABNORMAL LOW (ref 3.5–5.0)
Alkaline Phosphatase: 55 U/L (ref 38–126)
Anion gap: 16 — ABNORMAL HIGH (ref 5–15)
BUN: 63 mg/dL — ABNORMAL HIGH (ref 8–23)
CO2: 19 mmol/L — ABNORMAL LOW (ref 22–32)
Calcium: 6.8 mg/dL — ABNORMAL LOW (ref 8.9–10.3)
Chloride: 97 mmol/L — ABNORMAL LOW (ref 98–111)
Creatinine, Ser: 5.05 mg/dL — ABNORMAL HIGH (ref 0.61–1.24)
GFR, Estimated: 10 mL/min — ABNORMAL LOW (ref >60)
Glucose, Bld: 148 mg/dL — ABNORMAL HIGH (ref 70–99)
Potassium: 3.7 mmol/L (ref 3.5–5.1)
Sodium: 132 mmol/L — ABNORMAL LOW (ref 135–145)
Total Bilirubin: 1.4 mg/dL — ABNORMAL HIGH (ref 0.3–1.2)
Total Protein: 5.2 g/dL — ABNORMAL LOW (ref 6.5–8.1)

## 2020-10-18 LAB — PHOSPHORUS
Phosphorus: 5.4 mg/dL — ABNORMAL HIGH (ref 2.5–4.6)
Phosphorus: 4.2 mg/dL (ref 2.5–4.6)
Phosphorus: 4.1 mg/dL (ref 2.5–4.6)

## 2020-10-18 LAB — CBC WITH DIFFERENTIAL/PLATELET
Abs Immature Granulocytes: 8.07 10*3/uL — ABNORMAL HIGH (ref 0.00–0.07)
Basophils Absolute: 0.2 10*3/uL — ABNORMAL HIGH (ref 0.0–0.1)
Basophils Relative: 0 %
Eosinophils Absolute: 0.3 10*3/uL (ref 0.0–0.5)
Eosinophils Relative: 1 %
HCT: 24.4 % — ABNORMAL LOW (ref 39.0–52.0)
Hemoglobin: 8.6 g/dL — ABNORMAL LOW (ref 13.0–17.0)
Immature Granulocytes: 18 %
Lymphocytes Relative: 1 %
Lymphs Abs: 0.5 10*3/uL — ABNORMAL LOW (ref 0.7–4.0)
MCH: 35.8 pg — ABNORMAL HIGH (ref 26.0–34.0)
MCHC: 35.2 g/dL (ref 30.0–36.0)
MCV: 101.7 fL — ABNORMAL HIGH (ref 80.0–100.0)
Monocytes Absolute: 5.8 10*3/uL — ABNORMAL HIGH (ref 0.1–1.0)
Monocytes Relative: 13 %
Neutro Abs: 29.1 10*3/uL — ABNORMAL HIGH (ref 1.7–7.7)
Neutrophils Relative %: 67 %
Platelets: 162 10*3/uL (ref 150–400)
RBC: 2.4 MIL/uL — ABNORMAL LOW (ref 4.22–5.81)
RDW: 14.9 % (ref 11.5–15.5)
WBC: 43.8 10*3/uL — ABNORMAL HIGH (ref 4.0–10.5)
nRBC: 0.2 % (ref 0.0–0.2)

## 2020-10-18 LAB — HEPARIN LEVEL (UNFRACTIONATED)
Heparin Unfractionated: 1.22 IU/mL — ABNORMAL HIGH (ref 0.30–0.70)
Heparin Unfractionated: 1.06 IU/mL — ABNORMAL HIGH (ref 0.30–0.70)

## 2020-10-18 LAB — PATHOLOGIST SMEAR REVIEW

## 2020-10-18 LAB — BRAIN NATRIURETIC PEPTIDE: B Natriuretic Peptide: 520.1 pg/mL — ABNORMAL HIGH (ref 0.0–100.0)

## 2020-10-18 LAB — APTT
aPTT: 55 seconds — ABNORMAL HIGH (ref 24–36)
aPTT: 70 seconds — ABNORMAL HIGH (ref 24–36)

## 2020-10-18 LAB — MAGNESIUM
Magnesium: 2.2 mg/dL (ref 1.7–2.4)
Magnesium: 2.3 mg/dL (ref 1.7–2.4)
Magnesium: 2.3 mg/dL (ref 1.7–2.4)

## 2020-10-18 LAB — PROCALCITONIN: Procalcitonin: 2.03 ng/mL

## 2020-10-18 LAB — C-REACTIVE PROTEIN: CRP: 13.2 mg/dL — ABNORMAL HIGH (ref <1.0)

## 2020-10-18 LAB — NOROVIRUS GROUP 1 & 2 BY PCR, STOOL
Norovirus 1 by PCR: NEGATIVE
Norovirus 2  by PCR: NEGATIVE

## 2020-10-18 LAB — D-DIMER, QUANTITATIVE: D-Dimer, Quant: 7.26 ug/mL-FEU — ABNORMAL HIGH (ref 0.00–0.50)

## 2020-10-18 LAB — GLUCOSE, CAPILLARY
Glucose-Capillary: 142 mg/dL — ABNORMAL HIGH (ref 70–99)
Glucose-Capillary: 135 mg/dL — ABNORMAL HIGH (ref 70–99)
Glucose-Capillary: 144 mg/dL — ABNORMAL HIGH (ref 70–99)
Glucose-Capillary: 185 mg/dL — ABNORMAL HIGH (ref 70–99)
Glucose-Capillary: 177 mg/dL — ABNORMAL HIGH (ref 70–99)
Glucose-Capillary: 170 mg/dL — ABNORMAL HIGH (ref 70–99)

## 2020-10-18 MED ORDER — B COMPLEX-C PO TABS
1.0000 | ORAL_TABLET | Freq: Every day | ORAL | Status: DC
  Administered 2020-10-18 – 2020-10-24 (×6): 1
  Filled 2020-10-18 (×6): qty 1

## 2020-10-18 MED ORDER — POLYETHYLENE GLYCOL 3350 17 G PO PACK
17.0000 g | PACK | Freq: Every day | ORAL | Status: DC
  Administered 2020-10-19 – 2020-10-20 (×2): 17 g
  Filled 2020-10-18 (×2): qty 1

## 2020-10-18 MED ORDER — ALBUTEROL SULFATE HFA 108 (90 BASE) MCG/ACT IN AERS
2.0000 | INHALATION_SPRAY | Freq: Four times a day (QID) | RESPIRATORY_TRACT | Status: DC | PRN
  Filled 2020-10-18: qty 6.7

## 2020-10-18 MED ORDER — POLYETHYLENE GLYCOL 3350 17 G PO PACK
17.0000 g | PACK | Freq: Every day | ORAL | Status: DC
  Administered 2020-10-18: 17 g via ORAL
  Filled 2020-10-18: qty 1

## 2020-10-18 MED ORDER — METHYLPREDNISOLONE SODIUM SUCC 40 MG IJ SOLR
40.0000 mg | Freq: Every day | INTRAMUSCULAR | Status: DC
  Administered 2020-10-19: 40 mg via INTRAVENOUS
  Filled 2020-10-18: qty 1

## 2020-10-18 MED ORDER — MELATONIN 3 MG PO TABS
3.0000 mg | ORAL_TABLET | Freq: Every evening | ORAL | Status: DC | PRN
  Administered 2020-10-20: 3 mg
  Filled 2020-10-18: qty 1

## 2020-10-18 MED ORDER — AMIODARONE HCL IN DEXTROSE 360-4.14 MG/200ML-% IV SOLN
30.0000 mg/h | INTRAVENOUS | Status: DC
  Administered 2020-10-18 – 2020-10-22 (×8): 30 mg/h via INTRAVENOUS
  Filled 2020-10-18 (×8): qty 200

## 2020-10-18 MED ORDER — QUETIAPINE FUMARATE 25 MG PO TABS
25.0000 mg | ORAL_TABLET | Freq: Two times a day (BID) | ORAL | Status: DC
  Administered 2020-10-18: 25 mg
  Filled 2020-10-18: qty 1

## 2020-10-18 MED ORDER — FENTANYL CITRATE (PF) 100 MCG/2ML IJ SOLN
25.0000 ug | INTRAMUSCULAR | Status: DC | PRN
  Administered 2020-10-18 – 2020-10-26 (×9): 25 ug via INTRAVENOUS
  Filled 2020-10-18 (×9): qty 2

## 2020-10-18 MED ORDER — GUAIFENESIN-DM 100-10 MG/5ML PO SYRP: 10.0000 mL | ORAL_SOLUTION | ORAL | Status: DC | PRN

## 2020-10-18 MED ORDER — VITAL AF 1.2 CAL PO LIQD
1000.0000 mL | ORAL | Status: DC
  Administered 2020-10-18 – 2020-10-22 (×5): 1000 mL
  Filled 2020-10-18: qty 1000

## 2020-10-18 MED ORDER — ACETAMINOPHEN 160 MG/5ML PO SOLN: 650.0000 mg | Freq: Four times a day (QID) | ORAL | Status: DC | PRN

## 2020-10-18 MED ORDER — ZINC SULFATE 220 (50 ZN) MG PO CAPS
220.0000 mg | ORAL_CAPSULE | Freq: Every day | ORAL | Status: DC
  Administered 2020-10-19 – 2020-10-22 (×4): 220 mg
  Filled 2020-10-18 (×4): qty 1

## 2020-10-18 MED ORDER — HYDROCOD POLST-CPM POLST ER 10-8 MG/5ML PO SUER: 5.0000 mL | Freq: Two times a day (BID) | ORAL | Status: DC | PRN

## 2020-10-18 MED ORDER — PROSOURCE TF PO LIQD
45.0000 mL | Freq: Two times a day (BID) | ORAL | Status: DC
  Administered 2020-10-18 – 2020-10-22 (×9): 45 mL
  Filled 2020-10-18 (×9): qty 45

## 2020-10-18 MED ORDER — ASCORBIC ACID 500 MG PO TABS: 500.0000 mg | ORAL_TABLET | Freq: Every day | ORAL | Status: DC

## 2020-10-18 MED ORDER — SODIUM CHLORIDE 0.9 % IV SOLN
4.0000 g | Freq: Once | INTRAVENOUS | Status: AC
  Administered 2020-10-18: 4 g via INTRAVENOUS
  Filled 2020-10-18: qty 40

## 2020-10-18 MED ORDER — AMIODARONE HCL IN DEXTROSE 360-4.14 MG/200ML-% IV SOLN
60.0000 mg/h | INTRAVENOUS | Status: AC
  Administered 2020-10-18: 60 mg/h via INTRAVENOUS

## 2020-10-18 NOTE — Progress Notes (Signed)
Physical Therapy Treatment Patient Details Name: Raymond Hurley. MRN: [id number] DOB: 09/14/1931 Today's Date: 10/18/2020    History of Present Illness Pt is a 87 y.o. male with past medical history for metastatic melanoma with pancreatic head mass, A fib (on Eliquis), CHF (EF 45% as of 08/2019), currently hospitalized for COVID-PNA and possible aspiration PNA presenting for consultation of pancreatitis. Started on CRRT (IJ access 10/16/20)    PT Comments    Pt eager to move and slightly impulsive to get up. Pt with no awareness of multiple lines and had some hallucinations however pt was able to stand x2 and side step to [facility] with min/modAx2. RN present to management CRRT. Pt's SpO2 >95% on 10Lo2 via [place], HR 110s-140s. Pt appreciative of services. Acute PT to cont to follow.     Follow Up Recommendations  Home health PT;Supervision/Assistance - 24 hour (will continue to monitor)     Equipment Recommendations  Rolling walker with 5" wheels    Recommendations for Other Services       Precautions / Restrictions Precautions Precautions: Fall;Other (comment) Precaution Comments: covid Restrictions Weight Bearing Restrictions: No    Mobility  Bed Mobility Overal bed mobility: Needs Assistance Bed Mobility: Sit to Supine;Supine to Sit     Supine to sit: Min assist;+2 for safety/equipment;HOB elevated Sit to supine: Mod assist;+2 for physical assistance;+2 for safety/equipment   General bed mobility comments: pt eager to get up to EOB, RN present to manage lines, modA for LE management back into bed  Transfers Overall transfer level: Needs assistance Equipment used: 2 person hand held assist Transfers: Sit to/from Stand Sit to Stand: Mod assist;+2 physical assistance;+2 safety/equipment;From elevated surface         General transfer comment: RN present for management of HD lines, pt initiated powering up, impulsive and no awareness of lines, pt able to side step x 5 to  [facility]  Ambulation/Gait             General Gait Details: unable due to HD   Stairs             Wheelchair Mobility    Modified Rankin (Stroke Patients Only)       Balance Overall balance assessment: Needs assistance Sitting-balance support: Feet supported;Single extremity supported Sitting balance-Leahy Scale: Fair     Standing balance support: Bilateral upper extremity supported Standing balance-Leahy Scale: Poor Standing balance comment: requiring assist for balance                            Cognition Arousal/Alertness: Awake/alert Behavior During Therapy: Impulsive (very quick to move with little line awareness) Overall Cognitive Status: Impaired/Different from baseline Area of Impairment: Awareness;Memory;Safety/judgement                     Memory: Decreased short-term memory   Safety/Judgement: Decreased awareness of deficits;Decreased awareness of safety Awareness: Emergent Problem Solving: Slow processing;Difficulty sequencing;Requires verbal cues General Comments: pt impulsive with standing, was seeing things on the floor/hallucinating      Exercises General Exercises - Lower Extremity Ankle Circles/Pumps: AROM;Both;5 reps Quad Sets: AROM;Both;5 reps Gluteal Sets: AROM;Both;5 reps;Supine Other Exercises Other Exercises: AROM of BUE (focus on hands) to decreased BUE edema    General Comments General comments (skin integrity, edema, etc.): SpO2 >95% on 10LO2 via [place], HR 1110s-140s      Pertinent Vitals/Pain Pain Assessment: Faces Faces Pain Scale: Hurts even more Pain Location: scrotum and  back Pain Descriptors / Indicators: Discomfort;Grimacing;Tender Pain Intervention(s): Monitored during session (assist with management of scrotum)    Home Living Family/patient expects to be discharged to:: Private residence Living Arrangements: Spouse/significant other Available Help at Discharge: Family;Available 24 hours/day Type  of Home: House Home Access: Level entry (at front)   Home Layout: One level;Other (Comment);Laundry or work area in [place]: Shower seat;Grab bars - tub/shower Additional Comments: spouse is healthy; son works for family business    Prior Function Level of Independence: Independent with assistive device(s);Independent      Comments: driving; work full-time; Careers adviser; no AD   PT Goals (current goals can now be found in the care plan section) Acute Rehab PT Goals Patient Stated Goal: get more comfortable, PT Goal Formulation: With patient Time For Goal Achievement: 10/30/20 Potential to Achieve Goals: Good Progress towards PT goals: Progressing toward goals    Frequency    Min 3X/week      PT Plan Current plan remains appropriate    Co-evaluation PT/OT/SLP Co-Evaluation/Treatment: Yes Reason for Co-Treatment: Complexity of the patient's impairments (multi-system involvement) PT goals addressed during session: Mobility/safety with mobility OT goals addressed during session: ADL's and self-care;Strengthening/ROM      AM-PAC PT "6 Clicks" Mobility   Outcome Measure  Help needed turning from your back to your side while in a flat bed without using bedrails?: A Little Help needed moving from lying on your back to sitting on the side of a flat bed without using bedrails?: A Little Help needed moving to and from a bed to a chair (including a wheelchair)?: A Little Help needed standing up from a chair using your arms (e.g., wheelchair or bedside chair)?: A Little Help needed to walk in hospital room?: A Lot Help needed climbing 3-5 steps with a railing? : A Lot 6 Click Score: 16    End of Session Equipment Utilized During Treatment: Oxygen Activity Tolerance: Patient tolerated treatment well;Patient limited by fatigue Patient left: in bed;with call bell/phone within reach;with nursing/sitter in room Nurse Communication: Mobility status PT Visit  Diagnosis: Other abnormalities of gait and mobility (R26.89);Difficulty in walking, not elsewhere classified (R26.2);Muscle weakness (generalized) (M62.81)     Time: 8768-1157 PT Time Calculation (min) (ACUTE ONLY): 23 min  Charges:  $Therapeutic Activity: 8-22 mins                     Kittie Plater, PT, DPT Acute Rehabilitation Services Pager #: [phone] Office #: [phone]    Berline Lopes 10/18/2020, 1:48 PM

## 2020-10-18 NOTE — Progress Notes (Signed)
  Speech Language Pathology Treatment: Dysphagia  Patient Details Name: Raymond Hurley. MRN: [id number] DOB: 1931/01/08 Today's Date: 10/18/2020 Time: 4696-2952 SLP Time Calculation (min) (ACUTE ONLY): 16 min  Assessment / Plan / Recommendation Clinical Impression  Pt seen with RN at bedside, pt participatory but requires cueing and is reporting hallucinations. Oral care completed with removal of thick oral secretions. Pt then able to take consecutive and single sips of water without difficulty. Also able to consume purees and masticate solids. RN also offered meds, which pt attempted with ships of water. He needed to tilt head posterior ally with the tablet given and exhibited prolonged coughing after this pill. Subsequent pills given whole in puree. Suspect pt has some mild sensed aspiration with water taken with pill. Recommend starting sips of water today, pills whole in puree. Will f/u for further advancement and tolerance if pt does well.   HPI HPI: Pt is an 87 y.o. male with medical history significant for atrial fibrillation on chronic anticoagulation, systolic CHF, melanoma, and vitamin B12 deficiency who presented after being found to be acutely altered with fever four days prior to admission. CXR 1/2: Stable small left pleural effusion. Left retrocardiac opacity with increased volume loss, indicating left lower lobe atelectasis with possible component of pneumonia. Found to have [place]. MRI brain negative.      SLP Plan  Continue with current plan of care       Recommendations  Diet recommendations: Thin liquid Liquids provided via: Cup;Straw Medication Administration: Whole meds with puree Supervision: Staff to assist with self feeding Compensations: Slow rate;Small sips/bites Postural Changes and/or Swallow Maneuvers: Seated upright 90 degrees                Plan: Continue with current plan of care       GO                Meleena Munroe, Katherene Ponto 10/18/2020,  12:09 PM

## 2020-10-18 NOTE — Progress Notes (Addendum)
ANTICOAGULATION CONSULT NOTE  Pharmacy Consult for Heparin Indication: atrial fibrillation  Patient Measurements: Height: 5\' 8"  (172.7 cm) Weight: 106.4 kg (234 lb 9.1 oz) IBW/kg (Calculated) : 68.4 Heparin Dosing Weight: 88.5 kg  Vital Signs: Temp: 96.98 F (36.1 C) (01/05 1519) BP: 106/69 (01/05 1519) Pulse Rate: 134 (01/05 1519)  Labs: Recent Labs    10/16/20 0949 10/16/20 2000 10/17/20 0443 10/17/20 1619 10/17/20 1904 10/18/20 0352 10/18/20 1456  HGB 10.9*  --  9.2*  --   --  8.6*  --   HCT 31.9*  --  26.4*  --   --  24.4*  --   PLT 170  --  151  --   --  162  --   APTT >200*   < > 144*  --  60* 55* 70*  HEPARINUNFRC  --    < > 2.06*  --  1.58* 1.22* 1.06*  CREATININE 7.98*  --  8.62* 7.42*  --  5.05* 3.91*   < > = values in this interval not displayed.    Estimated Creatinine Clearance: 15.7 mL/min (A) (by C-G formula based on SCr of 3.91 mg/dL (H)).  Assessment: Patient is a 69 yom that is being admitted for COVID and AKI. Patient takes Apixaban pta for aflutter. Pharmacy has been asked to dose heparin at this time due to concerns for taking oral meds. Last dose of Apixaban was at 1030 on 10/15/20  Repeat aPTT is therapeutic, heparin level remains falsely elevated by DOAC use. Hgb and hct have been trending down, platelets are stable. No infusion issues or signs of bleeding per nursing.   Goal of Therapy:  aPTT 66-102 seconds Monitor platelets by anticoagulation protocol: Yes   Plan:  Continue heparin gtt at 900 units/h Recheck aPTT and heparin level with morning labs Daily CBC, HL and APTT until correlated  Norina Buzzard, PharmD PGY1 Pharmacy Resident 10/18/2020 4:11 PM  Please check AMION for all [place] numbers

## 2020-10-18 NOTE — Progress Notes (Signed)
NAME:  Raymond Fritze., MRN:  [id number], DOB:  1931/08/10, LOS: 4 ADMISSION DATE:  10/14/2020, CONSULTATION DATE:  10/14/2020 REFERRING MD:  [place] - [facility], CHIEF COMPLAINT:  Hypotension.    HPI/course in hospital   87 year old man who is hypotensive in the context of acute diarrhea on 1/1 was admitted w/ dx of COVID and acute dehydration from acute diarrheal illness. Of note he was fully vaccinated and had received his booster.  Critical care was consulted given hypotension however this responded to IV hydration and supportive care. Hospital course from 1/1 through 1/3:  Subsequently found to have aspiration pneumonia,  And, worsening delirium, and multiple organ failure, on 1/3  had worsening work of breathing more confusion, and CT of abdomen showed asymptomatic pancreatitis.  Given advanced age and multiple organ dysfunction in addition to concern about further clinical decline critical care asked to reevaluate  1/4 > hypotension and A.fib RVR.  Transferring to ICU.  Likely starting CRRT.  Past Medical History  Atrial fibrillation, melanoma, vitamin B12 deficiency.  Systolic cardiomyopathy. Metastatic melanoma with mets to pancreas ([facility]ed at [facility])    Interim history/subjective:  Patient was started on CVVHD yesterday.  Vasopressors changed to Levophed plus vasopressin.  This is slowly been titrated down.  Overall no acute events overnight.  Tolerated CVVHD.  Up watching television this morning however still in shock.  Objective   Blood pressure 120/66, pulse (!) 114, temperature (!) 97.52 F (36.4 C), resp. rate 19, height 5\' 8"  (1.727 m), weight 106.4 kg, SpO2 92 %.        Intake/Output Summary (Last 24 hours) at 10/18/2020 1007 Last data filed at 10/18/2020 0900 Gross per 24 hour  Intake 3052.37 ml  Output 2023 ml  Net 1029.37 ml   Filed Weights   10/14/20 0153 10/17/20 1145 10/18/20 0348  Weight: 95.1 kg 108 kg 106.4 kg    Examination:  General: Elderly male sitting  up in bed, slightly tachypneic Heart: Irregularly irregular, tachycardic, S1-S2 HEENT: Tracking appropriately following commands Lungs: Clear to auscultation bilaterally no crackles no wheeze Abdomen: Mildly distended no significant tenderness with palpation. Extremities: No significant edema  Assessment & Plan:   Acute renal failure, requiring CVVHD suspect all secondary to recent diarrheal illness and volume depletion -Has been seen by nephrology on 1/2 recommending supportive care, IV hydration and holding Entresto, possible CRRT Plan Fluids and bicarb stop Initiated CVVHD yesterday CVVHD orders per nephrology Follow electrolytes  Hypocalcemia - 2/2 above + pancreatitis. Plan Replete calcium as needed   Acute hypoxic respiratory failure secondary to aspiration pneumonia Plan Wean FiO2 as tolerated Goal SPO2 greater than 88%. Continue Unasyn, renally dose adjusted  Systolic cardiomyopathy with hx PAF - now in A.fib with RVR.  Echo 1/3 with EF 55 - 60% Plan Continue heparin Continue amiodarone Volume removal and euvolemia per CVVHD  COVID-19 infection Plan Complete course of steroids Holding additional treatments at this time  Acute metabolic encephalopathy,  Multifactorial secondary to metabolic acidosis, uremia, possible sepsis Plan CRRT This seems to be getting better  Acute pancreatitis - unclear etiology.  Lipase, trigs, Tbili normal.  Denies EtOH use.  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 Repeat noncontrasted CT of the abdomen and pelvis in 48 hours.   Best practice (evaluated daily)  Diet: Cortrak to start TF  Pain/Anxiety/Delirium  protocol (if indicated): NA VAP protocol (if indicated): NA DVT prophylaxis: heparin GI prophylaxis: PPI  Glucose control: SSI  Mobility: BR Disposition:ICU  Goals  of Care:  Last date of multidisciplinary goals of care discussion: Spoke with wife on 1/5 Family and staff present: na Summary of discussion: continue full support  Follow up goals of care discussion due: in 7 days  Code Status: FULL   Garner Nash, DO [place] [facility] 10/18/2020 10:14 AM

## 2020-10-18 NOTE — Procedures (Signed)
Cortrak  Person Inserting Tube:  Jacklynn Barnacle E, RD Tube Type:  Cortrak - 43 inches Tube Location:  Left nare Initial Placement:  Stomach Secured by: Bridle Technique Used to Measure Tube Placement:  Documented cm marking at nare/ corner of mouth Cortrak Secured At:  81 cm    Cortrak Tube Team Note:  Consult received to place a Cortrak feeding tube.   X-ray is required, abdominal x-ray has been ordered by the Cortrak team. Please confirm tube placement before using the Cortrak tube.   If the tube becomes dislodged please keep the tube and contact the Cortrak team at [email or web address] ([email or web address]) for replacement.  If after hours and replacement cannot be delayed, place a NG tube and confirm placement with an abdominal x-ray.   Jacklynn Barnacle, MS, RD, LDN Pager number available on Amion

## 2020-10-18 NOTE — Consult Note (Signed)
Consultation Note Date: 10/18/2020   Patient Name: Raymond Hurley.  DOB: 10/23/1930  MRN: [id number]  Age / Sex: 87 y.o., male  PCP: Lajean Manes, MD Referring Physician: Garner Nash, DO  Reason for Consultation: Establishing goals of care and Psychosocial/spiritual support  HPI/Patient Profile: 87 y.o. male   admitted on 10/14/2020 with past medical history significant of atrial fibrillation on chronic anticoagulation, systolic congestive heart failure last EF 45%, h/o metastatic melanoma, and vitamin B12 deficiency admitted 2/2 to altered mental status.     4 days prior to admission patient had developed subjective fever and reported to have a cough.  They were out of town at the time and he had went and got checked and was found to be positive for COVID-19.     They came back immediately to [facility]. Patient received both Moderna COVID-19 vaccines and booster. Reports that he got his booster about 4 months ago. During this time patient was complaining of severe abdominal pain and had been constipated he had taken MiraLAX once.  He soon thereafter had developed severe explosive diarrhea.  Patient wife notes that he was up and down throughout the course of the day with just watery diarrhea.  She had encouraged him to keep himself hydrated, but he had not been able to keep up.  Due to the massive bouts of diarrhea he was sleeping and chair close to the bathroom.  This morning when she went to check on him around 12:30 AM and noticed that he was talking gibberish.  She noted that he appeared spaced out and asked him if he knew who she was, but states that it took a while for him to say her name.  She immediately called EMS.  Upon their arrival wife notes that they appreciated some left-sided facial droop, but prior documentation reports right-sided facial droop.  ED Course: Presented to the hospital as a  code stroke and was evaluated by neurology.  CT scan of the brain showed no acute abnormalities.  Patient was noted to be afebrile, pulse 32-107, respirations 16-29, blood pressure 66/53-111/78, and O2 saturations maintained.  Labs significant for WBC 30.6, hemoglobin 12.5, sodium 130, chloride 96, CO2 18, BUN 57, creatinine 5.89, anion gap 16, albumin 2.8, AST 86, ALT 68, lactic acid 2.5, troponin 50-> 61, and BNP 137.7.  COVID-19 screening was noted to be positive.  Urinalysis did not appear to show signs of infection.  Chest x-ray showed concern for patchy left lung base opacity concerning for aspiration versus pneumonia.    Patient was started on CVVHD, vasopressors for blood pressure support.    GI consult 10-16-20 Abnormal CT of the pancreas: Large amount of inflammatory changes are noted involving the pancreas consistent with severe acute pancreatitis. Fluid is seen extending into both pericolic gutters as well as around the liver and spleen. No definite pseudocyst formation is seen at this time. -Surprisingly, patient's lipase is normal and he has no abdominal pain. ? Melanoma-related  -Normal lipid panel -One gallstone on CT  imaging.  Normal LFTs  Patient is high risk for decompensation 2/2 to multiple co-morbid ites.   Patient and family face treatment option decisions, advanced directive decisions and anticipatory care needs.   Clinical Assessment and Goals of Care:   This NP Wadie Lessen reviewed medical records, received report from team, and spoke to wife via telephone  to discuss diagnosis, prognosis, [place], EOL wishes disposition and options.  Wife was concerned "that palliative care was called in"   Concept of Palliative Care was introduced as specialized medical care for people and their families living with serious illness.  If focuses on providing relief from the symptoms and stress of a serious illness.  The goal is to improve quality of life for both the patient and the  family.  Values and goals of care important to patient and family were attempted to be elicited.  Created space and opportunity for patient  and family to explore thoughts and feelings regarding current medical information  Wife verbalized that she beleives her husband is "already doing better" and will recover from this hospitalization.  She speaks to his strength and the fact that he is still working seven days a week.  Therapeutic listening and emotional support offered.   A  discussion was had today regarding advanced directives.  Concepts specific to code status, artifical feeding and hydration, continued IV antibiotics and rehospitalization was had.    Patient remains a full code  Questions and concerns addressed.  Family  encouraged to call with questions or concerns.     PMT will continue to support holistically.     No documented HPOA or AD at his time.  Wife is next of kin      SUMMARY OF RECOMMENDATIONS    Code Status/Advance Care Planning:  Full code   Palliative Prophylaxis:   Aspiration, Bowel Regimen, Delirium Protocol, Frequent Pain Assessment and Oral Care  Additional Recommendations (Limitations, Scope, Preferences):  Full Scope Treatment    Psycho-social/Spiritual:   Desire for further Chaplaincy support: no-declined   Prognosis:   Unable to determine,  High risk to decompensate  Discharge Planning: To Be Determined      Primary Diagnoses: Present on Admission:  Severe sepsis ([place])  Pneumonia due to COVID-19 virus  PAF (paroxysmal atrial fibrillation) (HCC)  Chronic systolic CHF (congestive heart failure) (HCC)  Elevated troponin  Facial weakness  Hypocalcemia  Macrocytic anemia  Acute metabolic encephalopathy   I have reviewed the medical record, interviewed the patient and family, and examined the patient. The following aspects are pertinent.  Past Medical History:  Diagnosis Date   Atrial fibrillation ([place])     Melanoma ([place])    Vitamin B 12 deficiency    Social History   Socioeconomic History   Marital status: Married    Spouse name: Not on file   Number of children: Not on file   Years of education: Not on file   Highest education level: Not on file  Occupational History   Not on file  Tobacco Use   Smoking status: Never Smoker   Smokeless tobacco: Never Used  Vaping Use   Vaping Use: Never used  Substance and Sexual Activity   Alcohol use: Yes    Alcohol/week: 7.0 standard drinks    Types: 7 Glasses of wine per week   Drug use: Never   Sexual activity: Not on file  Other Topics Concern   Not on file  Social History Narrative   Not on file   Social  Determinants of Health   Financial Resource Strain: Not on file  Food Insecurity: Not on file  Transportation Needs: Not on file  Physical Activity: Not on file  Stress: Not on file  Social Connections: Not on file   No family history on file. Scheduled Meds:  [START ON 10/19/2020] vitamin C  500 mg Per Tube Daily   B-complex with vitamin C  1 tablet Per Tube Daily   Chlorhexidine Gluconate Cloth  6 each Topical Daily   feeding supplement (PROSource TF)  45 mL Per Tube BID   Gerhardt's butt cream   Topical TID   insulin aspart  1-3 Units Subcutaneous Q4H   mouth rinse  15 mL Mouth Rinse BID   [START ON 10/19/2020] methylPREDNISolone (SOLU-MEDROL) injection  40 mg Intravenous Daily   pantoprazole (PROTONIX) IV  40 mg Intravenous Q24H   [START ON 10/19/2020] polyethylene glycol  17 g Per Tube Daily   QUEtiapine  25 mg Per Tube BID   sodium chloride flush  3 mL Intravenous Q12H   [START ON 10/19/2020] zinc sulfate  220 mg Per Tube Daily   Continuous Infusions:   prismasol BGK 4/2.5 500 mL/hr at 10/18/20 0920   sodium chloride Stopped (10/18/20 1357)   amiodarone 60 mg/hr (10/18/20 1400)   amiodarone     calcium gluconate 140 mL/hr at 10/18/20 1400   ceFEPime (MAXIPIME) IV Stopped (10/18/20 1125)    feeding supplement (VITAL AF 1.2 CAL) 1,000 mL (10/18/20 1202)   heparin 900 Units/hr (10/18/20 1400)   norepinephrine (LEVOPHED) Adult infusion 4 mcg/min (10/18/20 1400)   prismasol BGK 4/2.5 2,000 mL/hr at 10/18/20 1405   sodium bicarbonate (isotonic) 1000 mL infusion 200 mL/hr at 10/18/20 1056   vasopressin 0.03 Units/min (10/18/20 1400)   PRN Meds:.sodium chloride, acetaminophen (TYLENOL) oral liquid 160 mg/5 mL, albuterol, chlorpheniramine-HYDROcodone, guaiFENesin-dextromethorphan, haloperidol lactate, heparin, heparin, melatonin, triamcinolone Medications Prior to Admission:  Prior to Admission medications   Medication Sig Start Date End Date Taking? Authorizing Provider  acetaminophen (TYLENOL) 500 MG tablet Take 1,000 mg by mouth every 6 (six) hours as needed for mild pain or headache.   Yes [provider]  ELIQUIS 5 MG TABS tablet TAKE ONE TABLET TWICE DAILY Patient taking differently: Take 5 mg by mouth 2 (two) times daily. 08/17/20  Yes Lelon Perla, MD  metoprolol succinate (TOPROL-XL) 25 MG 24 hr tablet Take 1 tablet (25 mg total) by mouth every evening. 05/22/20  Yes Lelon Perla, MD  sacubitril-valsartan (ENTRESTO) 24-26 MG Take 1 tablet by mouth 2 (two) times daily. 12/07/19  Yes Lelon Perla, MD  triamcinolone cream (KENALOG) 0.1 % Apply 1 application topically 2 (two) times daily as needed (itching, rash). 04/25/20  Yes [provider]  montelukast (SINGULAIR) 10 MG tablet Take 1 tablet by mouth once daily as directed Patient not taking: No sig reported 06/13/20   Kozlow, Donnamarie Poag, MD   Allergies  Allergen Reactions   Povidone-Iodine Swelling   Tape Swelling   Bee Venom Rash   Covid-19 Mrna Vacc (Moderna) Hives and Rash    Rash came after booster shot. Pt was fine with 1st and 2nd   Review of Systems  Unable to perform ROS   Physical Exam   -deferred 2/2 to Covid isolation and precautions  Vital Signs: BP 100/73    Pulse (!)  117    Temp (!) 93.38 F (34.1 C)    Resp (!) 21    Ht 5\' 8"  (1.727 m)  Wt 106.4 kg    SpO2 95%    BMI 35.67 kg/m  Pain Scale: 0-10   Pain Score: 7    SpO2: SpO2: 95 % O2 Device:SpO2: 95 % O2 Flow Rate: .O2 Flow Rate (L/min): 10 L/min  IO: Intake/output summary:   Intake/Output Summary (Last 24 hours) at 10/18/2020 1434 Last data filed at 10/18/2020 1400 Gross per 24 hour  Intake 2489.41 ml  Output 2338 ml  Net 151.41 ml    LBM: Last BM Date: 10/15/20 Baseline Weight: Weight: 95.1 kg Most recent weight: Weight: 106.4 kg     Palliative Assessment/Data:    Discussed with Dr Valeta Harms via secure chat  Time In: 1400 Time Out: 1510 Time Total: 70 minutes Greater than 50%  of this time was spent counseling and coordinating care related to the above assessment and plan.  Signed by: Wadie Lessen, NP   Please contact Palliative Medicine Team phone at [phone] for questions and concerns.  For individual provider: See Shea Evans

## 2020-10-18 NOTE — Progress Notes (Signed)
-  Chart reviewed.  Patient not seen.  Discussed with RN.  No acute GI issues based on discussion with RN.  Patient continues to deny abdominal pain denies nausea or vomiting.  No bowel movement in last 2 days.  No bleeding episodes.  -ICU team is planning for core track placement because of poor oral intake  Assessment  -----------------  -Abnormal CT scan showing large amount of inflammatory changes around the pancreas concerning for severe acute pancreatitis.  CT scan was without contrast.  Patient denied any abdominal pain.  Normal lipase  -Atrial fibrillation  with hypotension.  Requiring transfer to ICU. -Severe acute kidney injury -Acute respiratory failure with aspiration pneumonia along with COVID-19 infection -CHF  -Hypocalcemia.  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  Last CT scan in October 2021 at [facility] showed pancreatic calcification.   -Diarrhea.  Resolved.  Now complaining of constipation.   Recommendations  --------------------------  -Continue supportive care. -Recommend MRI MRCP with contrast once acute kidney injury and other acute issues are resolved.Marland Kitchen  MRI can be done in next 2 to 4 weeks.  -May consider repeat CT in next 24 to 48 hours for follow-up on severity of pancreatitis. -Okay to have low-fat diet from GI standpoint. -MiraLAX as needed for constipation. - GI will follow from distance.   Otis Brace MD, [place] 10/18/2020, 8:54 AM  Contact #  [phone]

## 2020-10-18 NOTE — Progress Notes (Signed)
Progress Note  Patient Name: Raymond Hurley. Date of Encounter: 10/18/2020  [facility] Cardiologist: Kirk Ruths, MD   Subjective   More alert, conversant, partially oriented. Knows his name and that he is in the hospital. Remains in shock (on norepi and vasopressin IV), anuric. No respiratory distress. AFib with RVR 120s-130s.  Inpatient Medications    Scheduled Meds: . [START ON 10/19/2020] vitamin C  500 mg Per Tube Daily  . B-complex with vitamin C  1 tablet Per Tube Daily  . Chlorhexidine Gluconate Cloth  6 each Topical Daily  . feeding supplement (PROSource TF)  45 mL Per Tube BID  . Gerhardt's butt cream   Topical TID  . insulin aspart  1-3 Units Subcutaneous Q4H  . mouth rinse  15 mL Mouth Rinse BID  . methylPREDNISolone (SOLU-MEDROL) injection  60 mg Intravenous Q12H  . pantoprazole (PROTONIX) IV  40 mg Intravenous Q24H  . [START ON 10/19/2020] polyethylene glycol  17 g Per Tube Daily  . QUEtiapine  25 mg Oral BID  . sodium chloride flush  3 mL Intravenous Q12H  . [START ON 10/19/2020] zinc sulfate  220 mg Per Tube Daily   Continuous Infusions: .  prismasol BGK 4/2.5 500 mL/hr at 10/18/20 0920  . sodium chloride Stopped (10/18/20 1055)  . amiodarone 30 mg/hr (10/18/20 1100)  . ceFEPime (MAXIPIME) IV 200 mL/hr at 10/18/20 1100  . feeding supplement (VITAL AF 1.2 CAL)    . heparin 900 Units/hr (10/18/20 1100)  . norepinephrine (LEVOPHED) Adult infusion 4 mcg/min (10/18/20 1100)  . prismasol BGK 4/2.5 2,000 mL/hr at 10/18/20 0920  . sodium bicarbonate (isotonic) 1000 mL infusion 200 mL/hr at 10/18/20 1056  . vasopressin 0.03 Units/min (10/18/20 1100)   PRN Meds: sodium chloride, acetaminophen, albuterol, chlorpheniramine-HYDROcodone, guaiFENesin-dextromethorphan, haloperidol lactate, heparin, heparin, loperamide, melatonin, triamcinolone   Vital Signs    Vitals:   10/18/20 1030 10/18/20 1045 10/18/20 1100 10/18/20 1115  BP: (!) 89/17 (!) 63/41 105/62  (!) 111/95  Pulse: (!) 122 (!) 122 (!) 125 (!) 130  Resp: 19 (!) 24 (!) 25 (!) 23  Temp: (!) 97.34 F (36.3 C) (!) 97.34 F (36.3 C) (!) 97.34 F (36.3 C) (!) 97.16 F (36.2 C)  TempSrc:      SpO2: 95% 95% (!) 88% 96%  Weight:      Height:        Intake/Output Summary (Last 24 hours) at 10/18/2020 1133 Last data filed at 10/18/2020 1100 Gross per 24 hour  Intake 2670.33 ml  Output 2151 ml  Net 519.33 ml   Last 3 Weights 10/18/2020 10/17/2020 10/14/2020  Weight (lbs) 234 lb 9.1 oz 238 lb 1.6 oz 209 lb 10.5 oz  Weight (kg) 106.4 kg 108 kg 95.1 kg      Telemetry    Atrial fibrillation with rates in the 120's to 130's. Occasional PVCs noted as well as short runs of non-sustained VT (longest run 3 beats). - Personally Reviewed  ECG    No new ECG tracing today. - Personally Reviewed  Physical Exam  Appears rather frail GEN: No acute distress.   Neck: No JVD Cardiac: irregular, no murmurs, rubs, or gallops.  Respiratory: Clear to auscultation bilaterally. GI: Soft, nontender, non-distended  MS: No edema; No deformity. Neuro:  Nonfocal  Psych: Normal affect. A little restless  Labs    High Sensitivity Troponin:   Recent Labs  Lab 10/14/20 0144 10/14/20 0344  TROPONINIHS 50* 61*      Chemistry  Recent Labs  Lab 10/16/20 0949 10/17/20 0443 10/17/20 1619 10/18/20 0352  NA 130* 127* 129* 132*  K 4.0 3.8 4.0 3.7  CL 98 98 98 97*  CO2 10* 11* 12* 19*  GLUCOSE 111* 173* 191* 148*  BUN 98* 108* 94* 63*  CREATININE 7.98* 8.62* 7.42* 5.05*  CALCIUM 5.3* 5.4* 6.0* 6.8*  PROT 5.5* 4.9*  --  5.2*  ALBUMIN 2.5* 2.3* 3.1* 2.9*  AST 54* 50*  --  51*  ALT 43 40  --  42  ALKPHOS 66 58  --  55  BILITOT 0.5 1.1  --  1.4*  GFRNONAA 6* 5* 7* 10*  ANIONGAP 22* 18* 19* 16*     Hematology Recent Labs  Lab 10/16/20 0949 10/17/20 0443 10/18/20 0352  WBC 50.7* 46.4* 43.8*  RBC 3.03* 2.53* 2.40*  HGB 10.9* 9.2* 8.6*  HCT 31.9* 26.4* 24.4*  MCV 105.3* 104.3* 101.7*  MCH  36.0* 36.4* 35.8*  MCHC 34.2 34.8 35.2  RDW 14.9 15.0 14.9  PLT 170 151 162    BNP Recent Labs  Lab 10/16/20 0953 10/17/20 0443 10/18/20 0352  BNP 811.3* 262.0* 520.1*     DDimer  Recent Labs  Lab 10/16/20 0949 10/17/20 0443 10/18/20 0352  DDIMER 10.70* 9.79* 7.26*     Radiology    CT ABDOMEN PELVIS WO CONTRAST  Result Date: 10/16/2020 CLINICAL DATA:  Acute generalized abdominal pain. EXAM: CT ABDOMEN AND PELVIS WITHOUT CONTRAST TECHNIQUE: Multidetector CT imaging of the abdomen and pelvis was performed following the standard protocol without IV contrast. COMPARISON:  None. FINDINGS: Lower chest: Mild bilateral pleural effusions are noted with adjacent subsegmental atelectasis. Hepatobiliary: Solitary gallstone is noted. No biliary dilatation is noted. Mild amount of fluid is noted around the liver. No focal abnormality is noted in the liver. Pancreas: Large amount of inflammatory changes are noted involving the pancreas consistent with severe acute pancreatitis. Fluid is seen extending into both pericolic gutters. No definite pseudocyst formation is seen at this time. Spleen: Mild amount of fluid is noted around the spleen. Adrenals/Urinary Tract: Adrenal glands appear normal. Left renal cyst is noted. No hydronephrosis or renal obstruction is noted. No renal or ureteral calculi are noted. Urinary bladder is decompressed secondary to Foley catheter. Stomach/Bowel: Stomach appears normal. There is no evidence of bowel obstruction. Sigmoid diverticulosis is noted without evidence of diverticulitis. Vascular/Lymphatic: Aortic atherosclerosis. No enlarged abdominal or pelvic lymph nodes. Reproductive: Prostate is unremarkable. Other: No abdominal wall hernia or abnormality. No abdominopelvic ascites. Musculoskeletal: No acute or significant osseous findings. IMPRESSION: 1. Large amount of inflammatory changes are noted involving the pancreas consistent with severe acute pancreatitis. Fluid is  seen extending into both pericolic gutters as well as around the liver and spleen. No definite pseudocyst formation is seen at this time. 2. Solitary gallstone is noted. 3. Sigmoid diverticulosis without evidence of diverticulitis. 4. Mild bilateral pleural effusions are noted with adjacent subsegmental atelectasis. 5. Aortic atherosclerosis. Aortic Atherosclerosis (ICD10-I70.0). Electronically Signed   By: Marijo Conception M.D.   On: 10/16/2020 13:28   DG CHEST PORT 1 VIEW  Result Date: 10/17/2020 CLINICAL DATA:  Central line placement EXAM: PORTABLE CHEST 1 VIEW COMPARISON:  Earlier today FINDINGS: Right IJ line with tip at the SVC. Prominent upper mediastinal width with biapical density that is stable; no suspected line related hematoma. There is hazy bilateral chest opacity in the setting of COVID infection. Stable heart size. Negative for pneumothorax. IMPRESSION: New central line with tip at the  SVC.  Negative for pneumothorax. Electronically Signed   By: Monte Fantasia M.D.   On: 10/17/2020 10:42   DG Chest Port 1 View  Result Date: 10/17/2020 CLINICAL DATA:  Respiratory failure.  COVID. EXAM: PORTABLE CHEST 1 VIEW COMPARISON:  10/16/2020. FINDINGS: Heart size stable. Low lung volumes with persistent bibasilar infiltrates/edema. Small left pleural effusion most likely present. No pneumothorax. Degenerative change thoracic spine. IMPRESSION: Low lung volumes with persistent bibasilar infiltrates/edema. Small left pleural effusion most likely present. Similar findings noted on prior exam. Electronically Signed   By: [place]   On: 10/17/2020 07:23   DG Abd Portable 1V  Result Date: 10/18/2020 CLINICAL DATA:  Feeding tube placement. EXAM: PORTABLE ABDOMEN - 1 VIEW COMPARISON:  October 15, 2020. FINDINGS: The bowel gas pattern is normal. Distal tip of feeding tube is seen in expected position of distal stomach. No radio-opaque calculi or other significant radiographic abnormality are seen.  IMPRESSION: Distal tip of feeding tube seen in expected position of distal stomach. Electronically Signed   By: Marijo Conception M.D.   On: 10/18/2020 10:46   ECHOCARDIOGRAM LIMITED  Result Date: 10/16/2020    ECHOCARDIOGRAM LIMITED REPORT   Patient Name:   Raymond Hurley. Date of Exam: 10/16/2020 Medical Rec #:  [id number]             Height:       68.0 in Accession #:    [id number]            Weight:       209.7 lb Date of Birth:  1931/08/22             BSA:          2.085 m Patient Age:    46 years              BP:           111/85 mmHg Patient Gender: M                     HR:           97 bpm. Exam Location:  Inpatient Procedure: Limited Echo, Cardiac Doppler and Color Doppler Indications:    TIA  History:        Patient has prior history of Echocardiogram examinations, most                 recent 09/07/2019. CHF, TIA, Arrythmias:Atrial Fibrillation;                 Risk Factors:Hypertension. COVID+.  Sonographer:    Dustin Flock Referring Phys: [place]  1. Left ventricular ejection fraction, by estimation, is 55 to 60%. The left ventricle has normal function. The left ventricle has no regional wall motion abnormalities. Left ventricular diastolic function could not be evaluated.  2. Right ventricular systolic function is normal. The right ventricular size is normal. There is normal pulmonary artery systolic pressure. The estimated right ventricular systolic pressure is 43.1 mmHg.  3. Left atrial size was mildly dilated.  4. Right atrial size was mildly dilated.  5. The mitral valve is normal in structure. No evidence of mitral valve regurgitation. No evidence of mitral stenosis.  6. The aortic valve is normal in structure. Aortic valve regurgitation is not visualized. No aortic stenosis is present.  7. The inferior vena cava is normal in size with greater than 50% respiratory variability, suggesting right atrial pressure of 3 mmHg.  FINDINGS  Left Ventricle: Left ventricular  ejection fraction, by estimation, is 55 to 60%. The left ventricle has normal function. The left ventricle has no regional wall motion abnormalities. The left ventricular internal cavity size was normal in size. There is  no left ventricular hypertrophy. Left ventricular diastolic function could not be evaluated. Left ventricular diastolic function could not be evaluated due to atrial fibrillation. Right Ventricle: The right ventricular size is normal. No increase in right ventricular wall thickness. Right ventricular systolic function is normal. There is normal pulmonary artery systolic pressure. The tricuspid regurgitant velocity is 2.76 m/s, and  with an assumed right atrial pressure of 3 mmHg, the estimated right ventricular systolic pressure is 42.7 mmHg. Left Atrium: Left atrial size was mildly dilated. Right Atrium: Right atrial size was mildly dilated. Pericardium: There is no evidence of pericardial effusion. Mitral Valve: The mitral valve is normal in structure. No evidence of mitral valve stenosis. Tricuspid Valve: The tricuspid valve is normal in structure. Tricuspid valve regurgitation is not demonstrated. No evidence of tricuspid stenosis. Aortic Valve: The aortic valve is normal in structure. Aortic valve regurgitation is not visualized. No aortic stenosis is present. Pulmonic Valve: The pulmonic valve was normal in structure. Pulmonic valve regurgitation is not visualized. No evidence of pulmonic stenosis. Aorta: The aortic root is normal in size and structure. Venous: The inferior vena cava is normal in size with greater than 50% respiratory variability, suggesting right atrial pressure of 3 mmHg. IAS/Shunts: No atrial level shunt detected by color flow Doppler. LEFT VENTRICLE PLAX 2D LVIDd:         5.40 cm Diastology LVIDs:         3.60 cm LV e' medial:    9.12 cm/s LV PW:         1.10 cm LV E/e' medial:  4.1 LV IVS:        1.00 cm LV e' lateral:   8.49 cm/s                        LV E/e' lateral:  4.4  RIGHT VENTRICLE RV S prime:     6.20 cm/s LEFT ATRIUM         Index LA diam:    3.20 cm 1.53 cm/m  MITRAL VALVE               TRICUSPID VALVE MV Area (PHT): 3.42 cm    TR Peak grad:   30.5 mmHg MV Decel Time: 222 msec    TR Vmax:        276.00 cm/s MV E velocity: 37.43 cm/s MV A velocity: 68.90 cm/s MV E/A ratio:  0.54 Cece Milhouse MD Electronically signed by Sanda Klein MD Signature Date/Time: 10/16/2020/4:41:44 PM    Final     Cardiac Studies   Echocardiogram 10/16/220: Impressions: 1. Left ventricular ejection fraction, by estimation, is 55 to 60%. The  left ventricle has normal function. The left ventricle has no regional  wall motion abnormalities. Left ventricular diastolic function could not  be evaluated.  2. Right ventricular systolic function is normal. The right ventricular  size is normal. There is normal pulmonary artery systolic pressure. The  estimated right ventricular systolic pressure is 06.2 mmHg.  3. Left atrial size was mildly dilated.  4. Right atrial size was mildly dilated.  5. The mitral valve is normal in structure. No evidence of mitral valve  regurgitation. No evidence of mitral stenosis.  6. The aortic valve  is normal in structure. Aortic valve regurgitation is  not visualized. No aortic stenosis is present.  7. The inferior vena cava is normal in size with greater than 50%  respiratory variability, suggesting right atrial pressure of 3 mmHg.   Patient Profile     87 y.o. 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 Patient was admitted on 10/14/2018 with TIA after presenting mental status and right facial droop. He was  found to have severe AKI requiring CRRT and suspected aspiration pneumonia as well as radiological findings suggestive of severe acute pancreatitis. Also found to be positive for COVID. Cardiology for further management of CHF and atrial fibrillation.  Assessment & Plan    Possible Septic Shock Aspiration Pneumonia - Condition continued to deteriorate yesterday and he was transferred to ICU for hypotension/shock.  - Started on Levophed yesterday. - Continue antibiotics.  - Management per PCCM.  Chronic Combined CHF/ Non-Ischemic Cardiomyopathy - Patient presented with acute stroke and family requested Cardiology consult for management of chronic CHF. - EF as low as 20-25% in 03/2019 but improved to 45% on Echo in 08/2019 . Echo this admission showed LVEF of 55-60% with normal wall motion. Evolution over time is highly suggestive of resolving tachycardia-related cardiomyopathy. Cardiac cath in 04/2019 showed only minimal CAD. - BNP initially 137 on admission then peaked at 811 on 1/3 and now trending back down. This is not unexpected with severe viral illness. - Chest x-ray showed patchy left lung base opacity. - Will defer management of volume status to Nephrology given AKI. Started on CRRT yesterday. - Continue to hold home Entresto due to hypotension and renal failure. - Home beta-blocker held due to shock. - Continue to monitor daily weights, strict I/O's, and renal function.  Paroxysmal Atrial Flutter/Fibrillation - History of paroxysmal atrial flutter s/p DCCV in 05/2019. This admission has had atrial fibrillation with mildly elevated rates. Review telemetry - rates mostly in the 120's to 130's. - Potassium 3.7 today. - Magnesium 2.2 today. - Echo as above. - Continue IV Amiodarone. Will increase to 60mg /hr for 6 hours for better control. Rates will likely be difficult to control with acute illness. Cannot add beta-blocker due to hypotension/shock. - On Eliquis at home but currently on  IV Heparin here.  Demand Ischemia - High-sensitivity troponin mildly elevated and flat at 50 >> 61.  - EKG shows no acute ischemic changes.  - Echo with normal LVEF and normal wall motion. - Not consistent with ACS. Minimal CAD on cath in 04/2019 reassuring. Demand ischemia in setting of underlying illness with shock.  AKI - Creatinine 5.89 on admission and peaked at 8.62.  Baseline 1.19. - He became anuric and was started on CRRT yesterday. - Management per primary team. - Continue to monitor closely.   TIA - Patient presented with facial droop and altered mental status.  - Head CT/Brain MRI unremarkable. - Already on Eliquis.  Otherwise, per primary team: - TIA - Hypocalcemia - Acute pancreatitis - Acute metabolic encephalopathy  - Metabolic acidosis - COVID  For questions or updates, please contact [place] Please consult [email or web address] for contact info under        Signed, Darreld Mclean, PA-C  10/18/2020, 11:33 AM    I have seen  and examined the patient along with Darreld Mclean, PA-C.   I have reviewed the chart, notes and new data.  I agree with PA/NP's note.  Key new complaints: appears more alert, mildly confused, no chest pain, unaware of arrhythmia, no subjective dyspnea Key examination changes: elderly and frail appearing, restless, but not in distress, rapid irregular rhythm Key new findings / data: AF w RVR. Improving metabolic abnormalities. BNP last 4 days up and down (238--811--262--520)  PLAN: Increase amio drip back to 1 mg/min for another 6 h. Other rate control meds limited by low BP and renal failure. No overt CHF. Prognosis remains very guarded, especially if renal function fails to recover.  Sanda Klein, MD, [place] [phone] 10/18/2020, 1:46 PM

## 2020-10-18 NOTE — Progress Notes (Signed)
[facility] Progress Note    Assessment/ Plan:   1. Acute renal failure - baseline cr 11.22 March 2020, now with severe AKI in the setting of multiple insults including COVID, diarrhea, and pancreatitis (also was taking entresto).  Severe AKI with Cr up to 8.6, falling UOP, and worsening hemodynamics in addition to bad metabolic acidosis.  Will start CRRT.  4K pre and dialysate, isotonic bicarb post, aggressively replete calcium and will likely need K repletion too.  I think a time- limited trial is reasonable (48-72 hrs). Will try to gently UF today with net neg 50 mL/ hr.    2. Acute pancreatitis: Noted on CT scan today at 1300.  GI has been consulted, he is NPO now, appreciate assistance.  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  Had a hospitalization at [facility] for pancreatitis 2016 when peripancreatic lymph node leading to dx first discovered. Lipase and lipid panel unremarkable  3. Hypocalcemia: likely related to #2- replete aggressively  4.  Diarrhea: likely  Due to #2 +/- COVID infection  5. Acute hypoxemic RF: likely aspiration PNA +/- COVID pneumonitis, getting Unasyn and doxycycline along with supportive care  6. COVID-19 infection: vaccinated and boosted- getting steroids  7. Acute encephalopathy: multifactorial, likely COVID +/- BUN in the 90s, Ct head on admit neg as were carotid dopplers  8.  Chronic systolic CHF: EF 06/22/36 on TTE 60-65% with no WMA  9.  Afib: hep and amiodarone gtts  10: hyponatremia: will correct with CRRT  11.  Dispo: critically ill with MSOF in ICU    Subjective:    Chart review only.  Pressor reequirements coming down, still in Afib with RVR.   Objective:   BP (!) 111/95   Pulse (!) 130   Temp (!) 97.16 F (36.2 C)   Resp (!) 23   Ht 5\' 8"  (1.727 m)   Wt 106.4 kg   SpO2 96%   BMI 35.67 kg/m   Intake/Output Summary (Last 24 hours) at 10/18/2020  1310 Last data filed at 10/18/2020 1100 Gross per 24 hour  Intake 2186.76 ml  Output 2141 ml  Net 45.76 ml   Weight change:   Physical Exam:  See exam from 10/17/20  Gen: older gentleman, confused, mildly agitated CVS: tachycardic Resp: tachypneic Abd: somewhat distended, nontender to palpation Ext: 1+ anasarca NEURO: confused and agitated  Imaging: DG CHEST PORT 1 VIEW  Result Date: 10/17/2020 CLINICAL DATA:  Central line placement EXAM: PORTABLE CHEST 1 VIEW COMPARISON:  Earlier today FINDINGS: Right IJ line with tip at the SVC. Prominent upper mediastinal width with biapical density that is stable; no suspected line related hematoma. There is hazy bilateral chest opacity in the setting of COVID infection. Stable heart size. Negative for pneumothorax. IMPRESSION: New central line with tip at the SVC.  Negative for pneumothorax. Electronically Signed   By: Monte Fantasia M.D.   On: 10/17/2020 10:42   DG Chest Port 1 View  Result Date: 10/17/2020 CLINICAL DATA:  Respiratory failure.  COVID. EXAM: PORTABLE CHEST 1 VIEW COMPARISON:  10/16/2020. FINDINGS: Heart size stable. Low lung volumes with persistent bibasilar infiltrates/edema. Small left pleural effusion most likely present. No pneumothorax. Degenerative change thoracic spine. IMPRESSION: Low lung volumes with persistent bibasilar infiltrates/edema. Small left pleural effusion most likely present. Similar findings noted on prior exam. Electronically Signed   By: [place]   On: 10/17/2020 07:23   DG Abd  Portable 1V  Result Date: 10/18/2020 CLINICAL DATA:  Feeding tube placement. EXAM: PORTABLE ABDOMEN - 1 VIEW COMPARISON:  October 15, 2020. FINDINGS: The bowel gas pattern is normal. Distal tip of feeding tube is seen in expected position of distal stomach. No radio-opaque calculi or other significant radiographic abnormality are seen. IMPRESSION: Distal tip of feeding tube seen in expected position of distal stomach. Electronically  Signed   By: Marijo Conception M.D.   On: 10/18/2020 10:46    Labs: BMET Recent Labs  Lab 10/14/20 0123 10/14/20 0129 10/14/20 0347 10/15/20 0445 10/16/20 0949 10/17/20 0443 10/17/20 1619 10/18/20 0352 10/18/20 1116  NA 130* 130* 129* 131* 130* 127* 129* 132*  --   K 3.8 3.8 3.4* 3.4* 4.0 3.8 4.0 3.7  --   CL 96* 96* 97* 101 98 98 98 97*  --   CO2 18*  --  19* 13* 10* 11* 12* 19*  --   GLUCOSE 145* 138* 103* 107* 111* 173* 191* 148*  --   BUN 57* 59* 56* 72* 98* 108* 94* 63*  --   CREATININE 5.89* 5.60* 5.08* 6.34* 7.98* 8.62* 7.42* 5.05*  --   CALCIUM 4.5*  --  4.5* 5.1* 5.3* 5.4* 6.0* 6.8*  --   PHOS  --   --   --  6.1*  --   --  8.3* 5.4* 4.2   CBC Recent Labs  Lab 10/15/20 0445 10/16/20 0949 10/17/20 0443 10/18/20 0352  WBC 42.7* 50.7* 46.4* 43.8*  NEUTROABS 30.5* 39.1* 34.6* 29.1*  HGB 11.2* 10.9* 9.2* 8.6*  HCT 34.3* 31.9* 26.4* 24.4*  MCV 107.9* 105.3* 104.3* 101.7*  PLT 157 170 151 162    Medications:    . [START ON 10/19/2020] vitamin C  500 mg Per Tube Daily  . B-complex with vitamin C  1 tablet Per Tube Daily  . Chlorhexidine Gluconate Cloth  6 each Topical Daily  . feeding supplement (PROSource TF)  45 mL Per Tube BID  . Gerhardt's butt cream   Topical TID  . insulin aspart  1-3 Units Subcutaneous Q4H  . mouth rinse  15 mL Mouth Rinse BID  . [START ON 10/19/2020] methylPREDNISolone (SOLU-MEDROL) injection  40 mg Intravenous Daily  . pantoprazole (PROTONIX) IV  40 mg Intravenous Q24H  . [START ON 10/19/2020] polyethylene glycol  17 g Per Tube Daily  . QUEtiapine  25 mg Per Tube BID  . sodium chloride flush  3 mL Intravenous Q12H  . [START ON 10/19/2020] zinc sulfate  220 mg Per Tube Daily      Madelon Lips, MD 10/18/2020, 1:10 PM

## 2020-10-18 NOTE — Progress Notes (Addendum)
[place] for Heparin Indication: atrial fibrillation  Allergies  Allergen Reactions  . Povidone-Iodine Swelling  . Tape Swelling  . Bee Venom Rash  . Covid-19 Mrna Vacc (Moderna) Hives and Rash    Rash came after booster shot. Pt was fine with 1st and 2nd    Patient Measurements: Height: 5\' 8"  (172.7 cm) Weight: 106.4 kg (234 lb 9.1 oz) IBW/kg (Calculated) : 68.4 Heparin Dosing Weight: 88.5 kg  Vital Signs: Temp: 96.62 F (35.9 C) (01/05 0500) Temp Source: Bladder (01/04 2000) BP: 134/61 (01/05 0430) Pulse Rate: 110 (01/05 0500)  Labs: Recent Labs    10/16/20 0949 10/16/20 2000 10/17/20 0443 10/17/20 1619 10/17/20 1904 10/18/20 0352  HGB 10.9*  --  9.2*  --   --  8.6*  HCT 31.9*  --  26.4*  --   --  24.4*  PLT 170  --  151  --   --  162  APTT >200*   < > 144*  --  60* 55*  HEPARINUNFRC  --    < > 2.06*  --  1.58* 1.22*  CREATININE 7.98*  --  8.62* 7.42*  --  5.05*   < > = values in this interval not displayed.    Estimated Creatinine Clearance: 12.2 mL/min (A) (by C-G formula based on SCr of 5.05 mg/dL (H)).  Assessment: Patient is a 70 yom that is being admitted for COVID and AKI. Patient takes Apixaban pta for aflutter. Pharmacy has been asked to dose heparin at this time due to concerns for taking oral meds. Last dose of Apixaban was at 1030 on 10/15/20  Repeat aPTT this evening remains subtherapeutic, heparin level remains falsely elevated by DOAC use. Pt coags have been very labile so will be cautious with adjustment. Hgb and hct have been trending down, platelets are stable. No infusion issues or signs of bleeding per nursing.   Goal of Therapy:  aPTT 66-102 seconds Monitor platelets by anticoagulation protocol: Yes   Plan:  Increase heparin to 900 units/h Recheck aPTT and heparin level in 6 hours Daily CBC, HL and APTT until correlated  Norina Buzzard, PharmD PGY1 Pharmacy Resident 10/18/2020 6:12 AM  Please check  AMION for all [place] numbers

## 2020-10-18 NOTE — Progress Notes (Signed)
Occupational Therapy Treatment Patient Details Name: Raymond Hurley. MRN: [id number] DOB: 25-Oct-1930 Today's Date: 10/18/2020    History of present illness Pt is a 87 y.o. male with past medical history for metastatic melanoma with pancreatic head mass, A fib (on Eliquis), CHF (EF 45% as of 08/2019), currently hospitalized for COVID-PNA and possible aspiration PNA presenting for consultation of pancreatitis. Started on CRRT (IJ access 10/16/20)   OT comments  Pt now in ICU and on CRRT (IJ access) on 10L HFNC throughout session and when Pleth is good, SpO2 reading in the low 90's. HR generally 120-140 with 149 highest witnessed during activity. Pt with increased confusion, mild impulsivity, new scrotal swelling and BUE swelling. Pt now mod A +2 for transfers (physical assist and also line management), max A for LB ADL, mod A for UB bathing, Pt repositioned after return supine with elevation for BUE and scrotum - Pt reports increased comfort. Educated Pt on AROM for BUE with emphasis on hands. Pt will continue to follow acutely with next session to focus on establishing HEP (Pt would participate and enjoy) and functional tasks EOB for sitting activity tolerance/direction following.   Follow Up Recommendations  Home health OT;Supervision/Assistance - 24 hour    Equipment Recommendations  Other (comment);3 in 1 bedside commode (keep monitoring needs)    Recommendations for Other Services      Precautions / Restrictions Precautions Precautions: Fall;Other (comment) Precaution Comments: covid Restrictions Weight Bearing Restrictions: No       Mobility Bed Mobility Overal bed mobility: Needs Assistance Bed Mobility: Sit to Supine;Supine to Sit     Supine to sit: Min assist;+2 for safety/equipment;HOB elevated Sit to supine: Mod assist;+2 for physical assistance;+2 for safety/equipment   General bed mobility comments: lots of assist for line management, BLE back into bed and slow  lowering of trunk due to IJ lines  Transfers Overall transfer level: Needs assistance Equipment used: 2 person hand held assist Transfers: Sit to/from Stand Sit to Stand: Mod assist;+2 physical assistance;+2 safety/equipment;From elevated surface         General transfer comment: quickly comes to standing pulling on 2 person HHA for rise from bed (stability)    Balance Overall balance assessment: Needs assistance Sitting-balance support: Bilateral upper extremity supported;Feet supported Sitting balance-Leahy Scale: Fair     Standing balance support: Bilateral upper extremity supported Standing balance-Leahy Scale: Poor Standing balance comment: requiring assist for balance                           ADL either performed or assessed with clinical judgement   ADL Overall ADL's : Needs assistance/impaired         Upper Body Bathing: Moderate assistance;Sitting           Lower Body Dressing: Maximal assistance;Sitting/lateral leans Lower Body Dressing Details (indicate cue type and reason): EOB Toilet Transfer: Moderate assistance;+2 for physical assistance;+2 for safety/equipment;Stand-pivot           Functional mobility during ADLs: Moderate assistance;+2 for physical assistance;+2 for safety/equipment (2 person HHA (3rd helpful for CRRT lines))       Vision       Perception     Praxis      Cognition Arousal/Alertness: Awake/alert Behavior During Therapy: Impulsive (very quick to move with little line awareness) Overall Cognitive Status: Impaired/Different from baseline Area of Impairment: Awareness;Memory;Safety/judgement  Memory: Decreased short-term memory   Safety/Judgement: Decreased awareness of deficits;Decreased awareness of safety Awareness: Emergent   General Comments: Pt did have hallucinations during session seeing things on the floor, slightly impulsive with sit<>stand        Exercises Exercises:  Other exercises Other Exercises Other Exercises: AROM of BUE (focus on hands) to decreased BUE edema   Shoulder Instructions       General Comments SpO2 overall 90's with poor pleth reading during activity (read in the low 80's on 10L via HFNC throughout session, HR generally 120-140 with highest rate 149 during standing activity. Also with noted scrotal swelling and discomfort. At end of session, scrotum elevated on towel covered in pillowcase Pt reports increased confort and relief    Pertinent Vitals/ Pain       Pain Assessment: Faces Faces Pain Scale: Hurts little more Pain Location: scrotum and back Pain Descriptors / Indicators: Discomfort;Grimacing;Tender Pain Intervention(s): Limited activity within patient's tolerance;Monitored during session;Repositioned;Other (comment) (elevation of scrotum)  Home Living                                          Prior Functioning/Environment              Frequency  Min 2X/week        Progress Toward Goals  OT Goals(current goals can now be found in the care plan section)  Progress towards OT goals: Not progressing toward goals - comment (decline in mental status, decline in cardiopulmonary status)  Acute Rehab OT Goals Patient Stated Goal: get more comfortable, OT Goal Formulation: With patient Time For Goal Achievement: 10/30/20 Potential to Achieve Goals: Good  Plan Discharge plan remains appropriate;Frequency remains appropriate;Equipment recommendations need to be updated    Co-evaluation    PT/OT/SLP Co-Evaluation/Treatment: Yes Reason for Co-Treatment: Complexity of the patient's impairments (multi-system involvement);For patient/therapist safety;To address functional/ADL transfers PT goals addressed during session: Mobility/safety with mobility;Balance;Strengthening/ROM OT goals addressed during session: ADL's and self-care;Strengthening/ROM      AM-PAC OT "6 Clicks" Daily Activity     Outcome  Measure   Help from another person eating meals?: A Little Help from another person taking care of personal grooming?: A Little Help from another person toileting, which includes using toliet, bedpan, or urinal?: A Lot Help from another person bathing (including washing, rinsing, drying)?: A Lot Help from another person to put on and taking off regular upper body clothing?: A Little Help from another person to put on and taking off regular lower body clothing?: A Lot 6 Click Score: 15    End of Session Equipment Utilized During Treatment: Oxygen (10L via HFNC)  OT Visit Diagnosis: Unsteadiness on feet (R26.81);Muscle weakness (generalized) (M62.81);Other symptoms and signs involving cognitive function   Activity Tolerance Patient tolerated treatment well   Patient Left in bed;with call bell/phone within reach;with nursing/sitter in room   Nurse Communication Mobility status        Time: 1202-1225 OT Time Calculation (min): 23 min  Charges: OT General Charges $OT Visit: 1 Visit OT Treatments $Therapeutic Activity: 8-22 mins  Jesse Sans OTR/L Acute Rehabilitation Services Pager: [phone] Office: [place] 10/18/2020, 1:41 PM

## 2020-10-18 NOTE — Progress Notes (Signed)
Nutrition Follow-up  DOCUMENTATION CODES:   Not applicable  INTERVENTION:   Tube Feeding via Cortrak: Vital AF 1.2 at 65 ml/hr Begin TF at 20 ml/hr, titrate by 10 mL q 8 hours until goal rate of 65 ml/hr Pro-Source TF 45 mL BID Provides 139 g of protein, 1952 kcals, 1264 mL of free water Meets 100% estimated calorie and protein needs  Add B-complex with C to replace losses while on CRRT   NUTRITION DIAGNOSIS:   Inadequate oral intake related to acute illness as evidenced by NPO status.  Being addressed via TF   GOAL:   Patient will meet greater than or equal to 90% of their needs  Progressing   MONITOR:   TF tolerance,Labs,Weight trends,Diet advancement,Skin  REASON FOR ASSESSMENT:   Rounds (CRRT, plan for Cortrak)    ASSESSMENT:   87 yo male admitted with acute diarrhea on 1/1 with diagnosis of COVID and acute dehydration and subsequently found to have aspiration pneumonia. PMH includes metastatic melanoma with mets to pancreas (follwed at [facility]), B12 deficiency, systolic cardiomyopathy  6/38 CRRT initiated 1/05 Cortrak placed  Pt on levophed and vasopressin but slowly titrating down. Remains on CRRT. More alert today  Remains NPO, SLP to evaluate GI has cleared pt for diet, ok for Cortrak to be gastric  Labs: sodium 132 (L), potassium 3.7 (wdl), phosphorus 5.4 (H) Meds: Vit C 500 mg daily, zinc sulfate, sodium bicarb infusion, ss novolog   Diet Order:   Diet Order            Diet NPO time specified Except for: Sips with Meds  Diet effective now                 EDUCATION NEEDS:   Not appropriate for education at this time  Skin:  Skin Assessment: Reviewed RN Assessment  Last BM:  1/2  Height:   Ht Readings from Last 1 Encounters:  10/17/20 5\' 8"  (1.727 m)    Weight:   Wt Readings from Last 1 Encounters:  10/18/20 106.4 kg    BMI:  Body mass index is 35.67 kg/m.  Estimated Nutritional Needs:   Kcal:  1900-2300  kcals  Protein:  115-140 g  Fluid:  >/= 2L   Kerman Passey MS, RDN, LDN, CNSC Registered Dietitian III Clinical Nutrition RD Pager and On-Call Pager Number Located in [place]

## 2020-10-19 DIAGNOSIS — J9601 Acute respiratory failure with hypoxia: Secondary | ICD-10-CM | POA: Diagnosis not present

## 2020-10-19 DIAGNOSIS — G9341 Metabolic encephalopathy: Secondary | ICD-10-CM | POA: Diagnosis not present

## 2020-10-19 DIAGNOSIS — R6520 Severe sepsis without septic shock: Secondary | ICD-10-CM | POA: Diagnosis not present

## 2020-10-19 DIAGNOSIS — A419 Sepsis, unspecified organism: Secondary | ICD-10-CM | POA: Diagnosis not present

## 2020-10-19 LAB — COMPREHENSIVE METABOLIC PANEL
ALT: 46 U/L — ABNORMAL HIGH (ref 0–44)
AST: 53 U/L — ABNORMAL HIGH (ref 15–41)
Albumin: 2.5 g/dL — ABNORMAL LOW (ref 3.5–5.0)
Alkaline Phosphatase: 67 U/L (ref 38–126)
Anion gap: 11 (ref 5–15)
BUN: 31 mg/dL — ABNORMAL HIGH (ref 8–23)
CO2: 28 mmol/L (ref 22–32)
Calcium: 6.6 mg/dL — ABNORMAL LOW (ref 8.9–10.3)
Chloride: 97 mmol/L — ABNORMAL LOW (ref 98–111)
Creatinine, Ser: 2.6 mg/dL — ABNORMAL HIGH (ref 0.61–1.24)
GFR, Estimated: 23 mL/min — ABNORMAL LOW (ref >60)
Glucose, Bld: 204 mg/dL — ABNORMAL HIGH (ref 70–99)
Potassium: 3.4 mmol/L — ABNORMAL LOW (ref 3.5–5.1)
Sodium: 136 mmol/L (ref 135–145)
Total Bilirubin: 0.9 mg/dL (ref 0.3–1.2)
Total Protein: 4.9 g/dL — ABNORMAL LOW (ref 6.5–8.1)

## 2020-10-19 LAB — RENAL FUNCTION PANEL
Albumin: 2.6 g/dL — ABNORMAL LOW (ref 3.5–5.0)
Anion gap: 12 (ref 5–15)
BUN: 28 mg/dL — ABNORMAL HIGH (ref 8–23)
CO2: 27 mmol/L (ref 22–32)
Calcium: 6.7 mg/dL — ABNORMAL LOW (ref 8.9–10.3)
Chloride: 96 mmol/L — ABNORMAL LOW (ref 98–111)
Creatinine, Ser: 2.32 mg/dL — ABNORMAL HIGH (ref 0.61–1.24)
GFR, Estimated: 27 mL/min — ABNORMAL LOW (ref >60)
Glucose, Bld: 187 mg/dL — ABNORMAL HIGH (ref 70–99)
Phosphorus: 2.9 mg/dL (ref 2.5–4.6)
Potassium: 3.6 mmol/L (ref 3.5–5.1)
Sodium: 135 mmol/L (ref 135–145)

## 2020-10-19 LAB — CBC WITH DIFFERENTIAL/PLATELET
Abs Immature Granulocytes: 0 10*3/uL (ref 0.00–0.07)
Basophils Absolute: 0 10*3/uL (ref 0.0–0.1)
Basophils Relative: 0 %
Eosinophils Absolute: 0 10*3/uL (ref 0.0–0.5)
Eosinophils Relative: 0 %
HCT: 22.9 % — ABNORMAL LOW (ref 39.0–52.0)
Hemoglobin: 8.2 g/dL — ABNORMAL LOW (ref 13.0–17.0)
Lymphocytes Relative: 0 %
Lymphs Abs: 0 10*3/uL — ABNORMAL LOW (ref 0.7–4.0)
MCH: 36.1 pg — ABNORMAL HIGH (ref 26.0–34.0)
MCHC: 35.8 g/dL (ref 30.0–36.0)
MCV: 100.9 fL — ABNORMAL HIGH (ref 80.0–100.0)
Monocytes Absolute: 7.4 10*3/uL — ABNORMAL HIGH (ref 0.1–1.0)
Monocytes Relative: 16 %
Neutro Abs: 38.6 10*3/uL — ABNORMAL HIGH (ref 1.7–7.7)
Neutrophils Relative %: 84 %
Platelets: 168 10*3/uL (ref 150–400)
RBC: 2.27 MIL/uL — ABNORMAL LOW (ref 4.22–5.81)
RDW: 15.1 % (ref 11.5–15.5)
WBC: 46 10*3/uL — ABNORMAL HIGH (ref 4.0–10.5)
nRBC: 0.4 % — ABNORMAL HIGH (ref 0.0–0.2)
nRBC: 1 /100 WBC — ABNORMAL HIGH

## 2020-10-19 LAB — C-REACTIVE PROTEIN: CRP: 10.5 mg/dL — ABNORMAL HIGH (ref <1.0)

## 2020-10-19 LAB — HEPARIN LEVEL (UNFRACTIONATED)
Heparin Unfractionated: 1.08 IU/mL — ABNORMAL HIGH (ref 0.30–0.70)
Heparin Unfractionated: 0.93 IU/mL — ABNORMAL HIGH (ref 0.30–0.70)
Heparin Unfractionated: 0.61 IU/mL (ref 0.30–0.70)

## 2020-10-19 LAB — GLUCOSE, CAPILLARY
Glucose-Capillary: 179 mg/dL — ABNORMAL HIGH (ref 70–99)
Glucose-Capillary: 183 mg/dL — ABNORMAL HIGH (ref 70–99)
Glucose-Capillary: 185 mg/dL — ABNORMAL HIGH (ref 70–99)
Glucose-Capillary: 190 mg/dL — ABNORMAL HIGH (ref 70–99)
Glucose-Capillary: 202 mg/dL — ABNORMAL HIGH (ref 70–99)
Glucose-Capillary: 207 mg/dL — ABNORMAL HIGH (ref 70–99)

## 2020-10-19 LAB — CULTURE, BLOOD (ROUTINE X 2)
Culture: NO GROWTH
Culture: NO GROWTH
Special Requests: ADEQUATE

## 2020-10-19 LAB — PHOSPHORUS
Phosphorus: 2.8 mg/dL (ref 2.5–4.6)
Phosphorus: 2.9 mg/dL (ref 2.5–4.6)

## 2020-10-19 LAB — PROCALCITONIN: Procalcitonin: 1.34 ng/mL

## 2020-10-19 LAB — MAGNESIUM
Magnesium: 2.2 mg/dL (ref 1.7–2.4)
Magnesium: 2.4 mg/dL (ref 1.7–2.4)

## 2020-10-19 LAB — APTT
aPTT: 155 seconds — ABNORMAL HIGH (ref 24–36)
aPTT: 125 seconds — ABNORMAL HIGH (ref 24–36)
aPTT: 109 seconds — ABNORMAL HIGH (ref 24–36)
aPTT: 46 seconds — ABNORMAL HIGH (ref 24–36)

## 2020-10-19 LAB — BRAIN NATRIURETIC PEPTIDE: B Natriuretic Peptide: 339.3 pg/mL — ABNORMAL HIGH (ref 0.0–100.0)

## 2020-10-19 LAB — CALCIUM, IONIZED: Calcium, Ionized, Serum: 3.6 mg/dL — ABNORMAL LOW (ref 4.5–5.6)

## 2020-10-19 LAB — D-DIMER, QUANTITATIVE: D-Dimer, Quant: 4.02 ug/mL-FEU — ABNORMAL HIGH (ref 0.00–0.50)

## 2020-10-19 MED ORDER — PREDNISONE 10 MG PO TABS: 10.0000 mg | ORAL_TABLET | Freq: Every day | ORAL | Status: DC

## 2020-10-19 MED ORDER — PRISMASOL BGK 4/2.5 32-4-2.5 MEQ/L REPLACEMENT SOLN
Status: DC
  Filled 2020-10-19 (×4): qty 5000

## 2020-10-19 MED ORDER — SODIUM CHLORIDE 0.9% FLUSH
10.0000 mL | Freq: Two times a day (BID) | INTRAVENOUS | Status: DC
  Administered 2020-10-19 – 2020-11-10 (×31): 10 mL

## 2020-10-19 MED ORDER — HEPARIN (PORCINE) 25000 UT/250ML-% IV SOLN
850.0000 [IU]/h | INTRAVENOUS | Status: DC
  Administered 2020-10-19: 750 [IU]/h via INTRAVENOUS
  Administered 2020-10-21 (×2): 1000 [IU]/h via INTRAVENOUS
  Filled 2020-10-19 (×4): qty 250

## 2020-10-19 MED ORDER — CALCIUM GLUCONATE-NACL 2-0.675 GM/100ML-% IV SOLN
2.0000 g | Freq: Once | INTRAVENOUS | Status: AC
  Administered 2020-10-19: 2000 mg via INTRAVENOUS
  Filled 2020-10-19: qty 100

## 2020-10-19 MED ORDER — PREDNISONE 20 MG PO TABS
40.0000 mg | ORAL_TABLET | Freq: Every day | ORAL | Status: AC
  Administered 2020-10-20 – 2020-10-22 (×3): 40 mg
  Filled 2020-10-19 (×3): qty 2

## 2020-10-19 MED ORDER — HEPARIN SODIUM (PORCINE) 1000 UNIT/ML DIALYSIS: 1000.0000 [IU] | INTRAMUSCULAR | Status: DC | PRN

## 2020-10-19 MED ORDER — PREDNISONE 20 MG PO TABS: 30.0000 mg | ORAL_TABLET | Freq: Every day | ORAL | Status: DC

## 2020-10-19 MED ORDER — INSULIN ASPART 100 UNIT/ML ~~LOC~~ SOLN
2.0000 [IU] | SUBCUTANEOUS | Status: DC
  Administered 2020-10-19: 6 [IU] via SUBCUTANEOUS
  Administered 2020-10-19: 4 [IU] via SUBCUTANEOUS
  Administered 2020-10-19 – 2020-10-20 (×2): 6 [IU] via SUBCUTANEOUS
  Administered 2020-10-20 (×2): 4 [IU] via SUBCUTANEOUS

## 2020-10-19 MED ORDER — INSULIN ASPART 100 UNIT/ML ~~LOC~~ SOLN: 0.0000 [IU] | Freq: Three times a day (TID) | SUBCUTANEOUS | Status: DC

## 2020-10-19 MED ORDER — SODIUM CHLORIDE 0.9% FLUSH
10.0000 mL | INTRAVENOUS | Status: DC | PRN
  Administered 2020-10-24 – 2020-10-25 (×2): 10 mL
  Administered 2020-10-26 – 2020-10-31 (×2): 20 mL
  Administered 2020-11-01 – 2020-11-06 (×3): 10 mL

## 2020-10-19 MED ORDER — RESOURCE THICKENUP CLEAR PO POWD
ORAL | Status: DC | PRN
  Filled 2020-10-19: qty 125

## 2020-10-19 MED ORDER — PREDNISONE 20 MG PO TABS: 20.0000 mg | ORAL_TABLET | Freq: Every day | ORAL | Status: DC

## 2020-10-19 MED ORDER — OXYCODONE HCL 5 MG PO TABS: 5.0000 mg | ORAL_TABLET | Freq: Four times a day (QID) | ORAL | Status: DC | PRN

## 2020-10-19 MED ORDER — METRONIDAZOLE IN NACL 5-0.79 MG/ML-% IV SOLN
500.0000 mg | Freq: Three times a day (TID) | INTRAVENOUS | Status: DC
  Administered 2020-10-19 – 2020-10-20 (×4): 500 mg via INTRAVENOUS
  Filled 2020-10-19 (×4): qty 100

## 2020-10-19 NOTE — Progress Notes (Signed)
[facility] Progress Note    Assessment/ Plan:   1. Acute renal failure - baseline cr 11.22 March 2020, now with severe AKI in the setting of multiple insults including COVID, diarrhea, and pancreatitis (also was taking entresto).  Severe AKI with Cr up to 8.6, falling UOP, and worsening hemodynamics in addition to bad metabolic acidosis.  Will start CRRT.  4K pre and dialysate, isotonic bicarb post, aggressively replete calcium and will likely need K repletion too.  I think a time- limited trial is reasonable (48-72 hrs). Try to UF 50-100 mL/ hr.  Looks a lot better, pressor requirement decreasing, likely will need another 24-48 hr CRRT and then determine transition to IHD.    2. Acute pancreatitis: Noted on CT scan 10/16/20. GI has been consulted, he is NPO now, appreciate assistance.  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  Had a hospitalization at [facility] for pancreatitis 2016 when peripancreatic lymph node leading to dx first discovered. Lipase and lipid panel unremarkable  3. Hypocalcemia: likely related to #2- replete aggressively  4.  Diarrhea: likely  Due to #2 +/- COVID infection  5. Acute hypoxemic RF: likely aspiration PNA +/- COVID pneumonitis, getting Unasyn and doxycycline along with supportive care  6. COVID-19 infection: vaccinated and boosted- getting steroids  7. Acute encephalopathy: multifactorial, likely COVID +/- BUN in the 90s, Ct head on admit neg as were carotid dopplers  8.  Chronic systolic CHF: EF 02/18/83 on TTE 60-65% with no WMA  9.  Afib: hep and amiodarone gtts  10: hyponatremia: will correct with CRRT  11.  Dispo: critically ill with MSOF in ICU    Subjective:    Seen in room.  Son Gershon Mussel on the phone, updated him re: CRRT.  Pressor requirement coming down.  Getting TF   Objective:   BP (!) 96/47   Pulse (!) 105   Temp 97.6 F (36.4 C) (Oral)   Resp 18   Ht 5\' 8"   (1.727 m)   Wt 105.6 kg   SpO2 100%   BMI 35.40 kg/m   Intake/Output Summary (Last 24 hours) at 10/19/2020 1516 Last data filed at 10/19/2020 1400 Gross per 24 hour  Intake 3184.49 ml  Output 3141 ml  Net 43.49 ml   Weight change: -2.4 kg  Physical Exam:    Gen: older gentleman, sitting in bed, NAD CVS: tachycardic, irregular Resp: tachypneic Abd: somewhat distended, nontender to palpation Ext: 2+ anasarca NEURO: confused and agitated  Imaging: DG Abd Portable 1V  Result Date: 10/18/2020 CLINICAL DATA:  Feeding tube placement. EXAM: PORTABLE ABDOMEN - 1 VIEW COMPARISON:  October 15, 2020. FINDINGS: The bowel gas pattern is normal. Distal tip of feeding tube is seen in expected position of distal stomach. No radio-opaque calculi or other significant radiographic abnormality are seen. IMPRESSION: Distal tip of feeding tube seen in expected position of distal stomach. Electronically Signed   By: Marijo Conception M.D.   On: 10/18/2020 10:46    Labs: BMET Recent Labs  Lab 10/15/20 0445 10/16/20 0949 10/17/20 0443 10/17/20 1619 10/18/20 0352 10/18/20 1116 10/18/20 1456 10/18/20 1909 10/19/20 0358  NA 131* 130* 127* 129* 132*  --  134*  --  136  K 3.4* 4.0 3.8 4.0 3.7  --  3.6  --  3.4*  CL 101 98 98 98 97*  --  96*  --  97*  CO2 13* 10* 11* 12* 19*  --  23  --  28  GLUCOSE 107* 111* 173* 191* 148*  --  190*  --  204*  BUN 72* 98* 108* 94* 63*  --  44*  --  31*  CREATININE 6.34* 7.98* 8.62* 7.42* 5.05*  --  3.91*  --  2.60*  CALCIUM 5.1* 5.3* 5.4* 6.0* 6.8*  --  7.3*  --  6.6*  PHOS 6.1*  --   --  8.3* 5.4* 4.2 4.2 4.1 2.8   CBC Recent Labs  Lab 10/16/20 0949 10/17/20 0443 10/18/20 0352 10/19/20 0358  WBC 50.7* 46.4* 43.8* 46.0*  NEUTROABS 39.1* 34.6* 29.1* 38.6*  HGB 10.9* 9.2* 8.6* 8.2*  HCT 31.9* 26.4* 24.4* 22.9*  MCV 105.3* 104.3* 101.7* 100.9*  PLT 170 151 162 168    Medications:    . B-complex with vitamin C  1 tablet Per Tube Daily  . Chlorhexidine  Gluconate Cloth  6 each Topical Daily  . feeding supplement (PROSource TF)  45 mL Per Tube BID  . Gerhardt's butt cream   Topical TID  . insulin aspart  2-6 Units Subcutaneous Q4H  . mouth rinse  15 mL Mouth Rinse BID  . pantoprazole (PROTONIX) IV  40 mg Intravenous Q24H  . polyethylene glycol  17 g Per Tube Daily  . [START ON 10/20/2020] predniSONE  40 mg Per Tube Q breakfast   Followed by  . [START ON 10/23/2020] predniSONE  30 mg Per Tube Q breakfast   Followed by  . [START ON 10/26/2020] predniSONE  20 mg Per Tube Q breakfast   Followed by  . [START ON 10/29/2020] predniSONE  10 mg Per Tube Q breakfast  . sodium chloride flush  10-40 mL Intracatheter Q12H  . sodium chloride flush  3 mL Intravenous Q12H  . zinc sulfate  220 mg Per Tube Daily      Madelon Lips, MD 10/19/2020, 3:16 PM

## 2020-10-19 NOTE — Progress Notes (Signed)
  Speech Language Pathology Treatment: Dysphagia  Patient Details Name: Raymond Hurley. MRN: [id number] DOB: 12-23-1930 Today's Date: 10/19/2020 Time: 9728-2060 SLP Time Calculation (min) (ACUTE ONLY): 15 min  Assessment / Plan / Recommendation Clinical Impression  Pt mentation and breathing appear similar in function to yesterday. He has been sipping on water and taking pills whole in puree without concerns from RN. Under SLP supervision pt had immediate coughing 1/10 sips. Suspect pts timing of airway protectin and swallowing can sometimes be off. Trialed nectar thick liquids with success, consecutive sips without coughing. Recommend a dys 3 mech soft diet with nectar thick liquids as pt is able to masticate and now more interested in PO intake.   HPI HPI: Pt is an 87 y.o. male with medical history significant for atrial fibrillation on chronic anticoagulation, systolic CHF, melanoma, and vitamin B12 deficiency who presented after being found to be acutely altered with fever four days prior to admission. CXR 1/2: Stable small left pleural effusion. Left retrocardiac opacity with increased volume loss, indicating left lower lobe atelectasis with possible component of pneumonia. Found to have [place]. MRI brain negative.      SLP Plan  Continue with current plan of care       Recommendations  Diet recommendations: Dysphagia 3 (mechanical soft);Nectar-thick liquid Liquids provided via: Cup;Straw Medication Administration: Whole meds with puree Supervision: Staff to assist with self feeding Compensations: Slow rate;Small sips/bites Postural Changes and/or Swallow Maneuvers: Seated upright 90 degrees                Oral Care Recommendations: Oral care BID Follow up Recommendations: Skilled Nursing facility Plan: Continue with current plan of care       GO                Shevawn Langenberg, Katherene Ponto 10/19/2020, 2:49 PM

## 2020-10-19 NOTE — Progress Notes (Signed)
ANTICOAGULATION CONSULT NOTE  Pharmacy Consult for Heparin Indication: atrial fibrillation  Patient Measurements: Height: 5\' 8"  (172.7 cm) Weight: 105.6 kg (232 lb 12.9 oz) IBW/kg (Calculated) : 68.4 Heparin Dosing Weight: 88.5 kg  Vital Signs: Temp: 97.6 F (36.4 C) (01/06 1600) Temp Source: Oral (01/06 1600) BP: 105/66 (01/06 1645) Pulse Rate: 108 (01/06 1645)  Labs: Recent Labs    10/17/20 0443 10/17/20 1619 10/18/20 0352 10/18/20 1456 10/19/20 0358 10/19/20 0359 10/19/20 0811 10/19/20 1154 10/19/20 1701  HGB 9.2*  --  8.6*  --  8.2*  --   --   --   --   HCT 26.4*  --  24.4*  --  22.9*  --   --   --   --   PLT 151  --  162  --  168  --   --   --   --   APTT 144*   < > 55* 70* 155*  --  125* 109* 46*  HEPARINUNFRC 2.06*   < > 1.22* 1.06*  --  1.08*  --  0.93*  --   CREATININE 8.62*   < > 5.05* 3.91* 2.60*  --   --   --   --    < > = values in this interval not displayed.    Estimated Creatinine Clearance: 23.6 mL/min (A) (by C-G formula based on SCr of 2.6 mg/dL (H)).  Assessment: Patient is a 68 yom that is being admitted for COVID and AKI. Patient takes Apixaban pta for aflutter. Pharmacy has been asked to dose heparin at this time due to concerns for taking oral meds. Last dose of Apixaban was at 1030 on 10/15/20.  Heparin held due to prolonged levels. Repeat levels now normalizing but still not correlating so will dose via aPTT for now.  Goal of Therapy:  aPTT 66-102 seconds Monitor platelets by anticoagulation protocol: Yes   Plan:  Restart heparin at 750 units/h Check aPTT in 8h   Arrie Senate, PharmD, [place], [facility] Clinical Pharmacist [phone] Please check AMION for all [place] numbers 10/19/2020

## 2020-10-19 NOTE — Progress Notes (Signed)
NAME:  Raymond Leffler., MRN:  [id number], DOB:  09-19-1931, LOS: 5 ADMISSION DATE:  10/14/2020, CONSULTATION DATE:  10/14/2020 REFERRING MD:  [place] [facility], CHIEF COMPLAINT:  Hypotension.    HPI/course in hospital   87 year old man who is hypotensive in the context of acute diarrhea on 1/1 was admitted w/ dx of COVID and acute dehydration from acute diarrheal illness. Of note he was fully vaccinated and had received his booster.  Critical care was consulted given hypotension however this responded to IV hydration and supportive care. Hospital course from 1/1 through 1/3:  Subsequently found to have aspiration pneumonia,  And, worsening delirium, and multiple organ failure, on 1/3  had worsening work of breathing more confusion, and CT of abdomen showed asymptomatic pancreatitis.  Given advanced age and multiple organ dysfunction in addition to concern about further clinical decline critical care asked to reevaluate  1/4 > hypotension and A.fib RVR.  Transferring to ICU.  Likely starting CRRT.  Past Medical History  Atrial fibrillation, melanoma, vitamin B12 deficiency.  Systolic cardiomyopathy. Metastatic melanoma with mets to pancreas (followed at [facility])    Interim history/subjective:   Remains critically ill. Not intubated. Remains on pressors.   Objective   Blood pressure 129/86, pulse (!) 136, temperature 97.6 F (36.4 C), temperature source Axillary, resp. rate (!) 27, height 5\' 8"  (1.727 m), weight 105.6 kg, SpO2 100 %.        Intake/Output Summary (Last 24 hours) at 10/19/2020 0901 Last data filed at 10/19/2020 0800 Gross per 24 hour  Intake 3176.97 ml  Output 2982 ml  Net 194.97 ml   Filed Weights   10/17/20 1145 10/18/20 0348 10/19/20 0406  Weight: 108 kg 106.4 kg 105.6 kg    Examination:  General: elderly male, cvvhd going  Heart: irregularly, irregular, s1 s2  HEENT: tracking appropriately  Lungs: CTAB, no crackles no wheeze  Abdomen: soft, nt nd  Extremities:  no significant edema   Assessment & Plan:   Acute renal failure, requiring CVVHD suspect all secondary to recent diarrheal illness and volume depletion -Has been seen by nephrology on 1/2 recommending supportive care, IV hydration and holding Entresto, possible CRRT Plan: Continue CVVHD  Follow electrolytes.   Hypocalcemia - 2/2 above + pancreatitis. Plan replete  Acute hypoxic respiratory failure secondary to aspiration pneumonia Plan Continue unasyn [place] fio2 as tolerated   Systolic cardiomyopathy with hx PAF - now in A.fib with RVR.  Echo 1/3 with EF 55 - 60% Plan Heparin ggt Amiodarone infusion  euvolemia per cvvhd   COVID-19 infection Plan Plan to taper steroids   Acute metabolic encephalopathy,  Multifactorial secondary to metabolic acidosis, uremia, possible sepsis Plan CRRT   Acute pancreatitis - unclear etiology.  Lipase, trigs, Tbili normal.  Denies EtOH use.  Of note, does have a hx of metastatic melanoma with mets to pancreas, s/p 30 infusions of Pembrolizumab in July 2018.Marland Kitchen Plan CT ABd/pelvis tomorrow for follow up per GI recs (orders placed)    Best practice (evaluated daily)  Diet: Cortrak to start TF  Pain/Anxiety/Delirium protocol (if indicated): NA VAP protocol (if indicated): NA DVT prophylaxis: heparin GI prophylaxis: PPI  Glucose control: SSI  Mobility: BR Disposition:ICU  Goals of Care:  Last date of multidisciplinary goals of care discussion: Spoke with wife on 1/5 Family and staff present: na Summary of discussion: continue full support  Follow up goals of care discussion due: in 7 days  Code Status: FULL   This patient  is critically ill with multiple organ system failure; which, requires frequent high complexity decision making, assessment, support, evaluation, and titration of therapies. This was completed through the application of advanced monitoring technologies and extensive interpretation of multiple databases. During this  encounter critical care time was devoted to patient care services described in this note for 32 minutes.   Garner Nash, DO [facility] 10/19/2020 9:01 AM

## 2020-10-19 NOTE — Progress Notes (Incomplete)
Progress Note  Patient Name: Raymond Hurley. Date of Encounter: 10/19/2020  [facility] Cardiologist: Kirk Ruths, MD   Subjective   ***  Inpatient Medications    Scheduled Meds: . B-complex with vitamin C  1 tablet Per Tube Daily  . Chlorhexidine Gluconate Cloth  6 each Topical Daily  . feeding supplement (PROSource TF)  45 mL Per Tube BID  . Gerhardt's butt cream   Topical TID  . insulin aspart  2-6 Units Subcutaneous Q4H  . mouth rinse  15 mL Mouth Rinse BID  . methylPREDNISolone (SOLU-MEDROL) injection  40 mg Intravenous Daily  . pantoprazole (PROTONIX) IV  40 mg Intravenous Q24H  . polyethylene glycol  17 g Per Tube Daily  . sodium chloride flush  10-40 mL Intracatheter Q12H  . sodium chloride flush  3 mL Intravenous Q12H  . zinc sulfate  220 mg Per Tube Daily   Continuous Infusions: .  prismasol BGK 4/2.5 500 mL/hr at 10/18/20 1954  .  prismasol BGK 4/2.5 200 mL/hr at 10/19/20 0800  . sodium chloride 10 mL/hr at 10/19/20 1100  . amiodarone 30 mg/hr (10/19/20 1100)  . ceFEPime (MAXIPIME) IV Stopped (10/19/20 0957)  . feeding supplement (VITAL AF 1.2 CAL) 1,000 mL (10/19/20 0433)  . norepinephrine (LEVOPHED) Adult infusion 8 mcg/min (10/19/20 1100)  . prismasol BGK 4/2.5 2,000 mL/hr at 10/19/20 1041  . vasopressin 0.04 Units/min (10/19/20 1100)   PRN Meds: sodium chloride, acetaminophen (TYLENOL) oral liquid 160 mg/5 mL, albuterol, chlorpheniramine-HYDROcodone, fentaNYL (SUBLIMAZE) injection, guaiFENesin-dextromethorphan, heparin, heparin, melatonin, oxyCODONE, sodium chloride flush, triamcinolone   Vital Signs    Vitals:   10/19/20 1000 10/19/20 1015 10/19/20 1030 10/19/20 1045  BP: (!) 84/68 101/70 115/89 105/71  Pulse: (!) 135 (!) 121 (!) 121 (!) 129  Resp: 20 19 (!) 22 20  Temp:      TempSrc:      SpO2: 100% 100% 100% 100%  Weight:      Height:        Intake/Output Summary (Last 24 hours) at 10/19/2020 1201 Last data filed at 10/19/2020  1100 Gross per 24 hour  Intake 3380.09 ml  Output 3152 ml  Net 228.09 ml   Last 3 Weights 10/19/2020 10/18/2020 10/17/2020  Weight (lbs) 232 lb 12.9 oz 234 lb 9.1 oz 238 lb 1.6 oz  Weight (kg) 105.6 kg 106.4 kg 108 kg      Telemetry    Atrial fibrillation with rates mostly in the 120's to 130's. Occasional PVCs. - Personally Reviewed  ECG    No new ECG tracing today. - Personally Reviewed  Physical Exam  *** GEN: No acute distress.   Neck: No JVD Cardiac: RRR, no murmurs, rubs, or gallops.  Respiratory: Clear to auscultation bilaterally. GI: Soft, nontender, non-distended  MS: No edema; No deformity. Neuro:  Nonfocal  Psych: Normal affect   Labs    High Sensitivity Troponin:   Recent Labs  Lab 10/14/20 0144 10/14/20 0344  TROPONINIHS 50* 61*      Chemistry Recent Labs  Lab 10/17/20 0443 10/17/20 1619 10/18/20 0352 10/18/20 1456 10/19/20 0358  NA 127*   < > 132* 134* 136  K 3.8   < > 3.7 3.6 3.4*  CL 98   < > 97* 96* 97*  CO2 11*   < > 19* 23 28  GLUCOSE 173*   < > 148* 190* 204*  BUN 108*   < > 63* 44* 31*  CREATININE 8.62*   < >  5.05* 3.91* 2.60*  CALCIUM 5.4*   < > 6.8* 7.3* 6.6*  PROT 4.9*  --  5.2*  --  4.9*  ALBUMIN 2.3*   < > 2.9* 2.7* 2.5*  AST 50*  --  51*  --  53*  ALT 40  --  42  --  46*  ALKPHOS 58  --  55  --  67  BILITOT 1.1  --  1.4*  --  0.9  GFRNONAA 5*   < > 10* 14* 23*  ANIONGAP 18*   < > 16* 15 11   < > = values in this interval not displayed.     Hematology Recent Labs  Lab 10/17/20 0443 10/18/20 0352 10/19/20 0358  WBC 46.4* 43.8* 46.0*  RBC 2.53* 2.40* 2.27*  HGB 9.2* 8.6* 8.2*  HCT 26.4* 24.4* 22.9*  MCV 104.3* 101.7* 100.9*  MCH 36.4* 35.8* 36.1*  MCHC 34.8 35.2 35.8  RDW 15.0 14.9 15.1  PLT 151 162 168    BNP Recent Labs  Lab 10/17/20 0443 10/18/20 0352 10/19/20 0359  BNP 262.0* 520.1* 339.3*     DDimer  Recent Labs  Lab 10/17/20 0443 10/18/20 0352 10/19/20 0358  DDIMER 9.79* 7.26* 4.02*      Radiology    DG Abd Portable 1V  Result Date: 10/18/2020 CLINICAL DATA:  Feeding tube placement. EXAM: PORTABLE ABDOMEN - 1 VIEW COMPARISON:  October 15, 2020. FINDINGS: The bowel gas pattern is normal. Distal tip of feeding tube is seen in expected position of distal stomach. No radio-opaque calculi or other significant radiographic abnormality are seen. IMPRESSION: Distal tip of feeding tube seen in expected position of distal stomach. Electronically Signed   By: Marijo Conception M.D.   On: 10/18/2020 10:46    Cardiac Studies   Echocardiogram 10/16/220: Impressions: 1. Left ventricular ejection fraction, by estimation, is 55 to 60%. The  left ventricle has normal function. The left ventricle has no regional  wall motion abnormalities. Left ventricular diastolic function could not  be evaluated.  2. Right ventricular systolic function is normal. The right ventricular  size is normal. There is normal pulmonary artery systolic pressure. The  estimated right ventricular systolic pressure is 55.7 mmHg.  3. Left atrial size was mildly dilated.  4. Right atrial size was mildly dilated.  5. The mitral valve is normal in structure. No evidence of mitral valve  regurgitation. No evidence of mitral stenosis.  6. The aortic valve is normal in structure. Aortic valve regurgitation is  not visualized. No aortic stenosis is present.  7. The inferior vena cava is normal in size with greater than 50%  respiratory variability, suggesting right atrial pressure of 3 mmHg.   Patient Profile     87 y.o. 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 Patient was admitted on  10/14/2018 with TIA after presenting mental status and right facial droop. He was found to have severe AKI requiring CRRT and suspected aspiration pneumonia as well as radiological findings suggestive of severe acute pancreatitis. Also found to be positive for COVID. Cardiology for further management of CHF and atrial fibrillation.  Assessment & Plan    Possible Septic Shock Acute Hypoxic Respiratory Failure Secondary to Aspiration Pneumonia - Condition continued to deteriorate  on 1/4 and he was transferred to ICU for hypotension/shock.  - Still on Levophed. BP labile. Systolic BP ranging from 83 to 140 today so far.  - Continue antibiotics.  - Management per PCCM.  Chronic Combined CHF/ Non-Ischemic Cardiomyopathy - Patient presented with acute stroke and family requested Cardiology consult for management of chronic CHF. -EF as low as 20-25% in 03/2019 but improved to 45% on Echo in 08/2019. Echo this admission showed LVEF of 55-60% with normal wall motion. Evolution over time is highly suggestive of resolving tachycardia-related cardiomyopathy. Cardiac cath in 04/2019 showed only minimal CAD. - BNP initially 137 on admission then peaked at 811 on 1/3 and now trending back down - 339 today. This is not unexpected with severe viral illness. - Initial chest x-ray showedpatchy left lung base opacity. - Will defer management of volume status to Nephrology given AKI. Started on CRRT yesterday. - Continue to hold home Entresto due to hypotensionand renal failure. - Home beta-blocker held due to shock. - Continue to monitor daily weights, strict I/O's, and renal function.  Paroxysmal Atrial Flutter/Fibrillation - History of paroxysmal atrial flutter s/p DCCV in 05/2019.  - Rates uncontrolled in the 120's to 130's. - Potassium 3.4 today. Will defer repletion to PCCM/Nephrology given AKI. - Magnesium 2.2 today. - Echo as above. - Continue IV Amiodarone. May need to increase Amiodarone again. Will  discuss with MD. Rates will likely be difficult to control with acute illness. Cannot add beta-blocker due to hypotension/shock.  - On Eliquis at home but currently on IV Heparin here.  Demand Ischemia - High-sensitivity troponin mildly elevated and flat at 50 >>61.  - EKG shows no acute ischemic changes.  - Echo with normal LVEF and normal wall motion. - Not consistent with ACS. Minimal CAD on cath in 04/2019 reassuring. Demand ischemia in setting of underlying illness with shock.  AKI - Creatinine 5.89 on admission and peaked at 8.62 on 1.4. Improving - 2.60 today. Baseline 1.19. - He became anuric and was started on CRRT on 1.4 - Continue to monitor closely.  - Management per Nephrology.  TIA - Patient presented with facial droop and altered mental status.  - Head CT/Brain MRI unremarkable. - Already on Eliquis at home given atrial fibrillation. Although currently on IV Heparin. - Management per primary team.  Otherwise, per primary team: - Hypocalcemia - Acute pancreatitis - Acute metabolic encephalopathy  - Metabolic acidosis - COVID  For questions or updates, please contact [facility] Please consult [email or web address] for contact info under        Signed, Darreld Mclean, PA-C  10/19/2020, 12:01 PM

## 2020-10-19 NOTE — Progress Notes (Signed)
ANTICOAGULATION CONSULT NOTE  Pharmacy Consult for Heparin Indication: atrial fibrillation  Patient Measurements: Height: 5\' 8"  (172.7 cm) Weight: 105.6 kg (232 lb 12.9 oz) IBW/kg (Calculated) : 68.4 Heparin Dosing Weight: 88.5 kg  Vital Signs: Temp: 97.6 F (36.4 C) (01/06 0800) Temp Source: Axillary (01/06 0800) BP: 84/68 (01/06 1000) Pulse Rate: 135 (01/06 1000)  Labs: Recent Labs    10/17/20 0443 10/17/20 1619 10/18/20 0352 10/18/20 1456 10/19/20 0358 10/19/20 0359 10/19/20 0811  HGB 9.2*  --  8.6*  --  8.2*  --   --   HCT 26.4*  --  24.4*  --  22.9*  --   --   PLT 151  --  162  --  168  --   --   APTT 144*   < > 55* 70* 155*  --  125*  HEPARINUNFRC 2.06*   < > 1.22* 1.06*  --  1.08*  --   CREATININE 8.62*   < > 5.05* 3.91* 2.60*  --   --    < > = values in this interval not displayed.    Estimated Creatinine Clearance: 23.6 mL/min (A) (by C-G formula based on SCr of 2.6 mg/dL (H)).  Assessment: Patient is a 28 yom that is being admitted for COVID and AKI. Patient takes Apixaban pta for aflutter. Pharmacy has been asked to dose heparin at this time due to concerns for taking oral meds. Last dose of Apixaban was at 1030 on 10/15/20  Repeat aPTT is supratherapeutic, despite heparin being held for 1 hour this morning due to temporary line occlusion (patient was bending arm). Heparin level may remain falsely elevated by DOAC use. Hgb and hct, platelets are stable. No signs of overt bleeding are seen.   Goal of Therapy:  aPTT 66-102 seconds Monitor platelets by anticoagulation protocol: Yes   Plan:  Hold heparin gtt  Recheck aPTT and heparin level before restarting heparin Daily CBC, HL and APTT until correlated  Norina Buzzard, PharmD PGY1 Pharmacy Resident 10/19/2020 10:29 AM  Please check AMION for all [facility] numbers

## 2020-10-19 NOTE — Progress Notes (Signed)
ANTICOAGULATION CONSULT NOTE  Pharmacy Consult for Heparin Indication: atrial fibrillation  Patient Measurements: Height: 5\' 8"  (172.7 cm) Weight: 105.6 kg (232 lb 12.9 oz) IBW/kg (Calculated) : 68.4 Heparin Dosing Weight: 88.5 kg  Vital Signs: Temp: 97.6 F (36.4 C) (01/06 1200) Temp Source: Oral (01/06 1200) BP: 133/104 (01/06 1303) Pulse Rate: 106 (01/06 1303)  Labs: Recent Labs    10/17/20 0443 10/17/20 1619 10/18/20 0352 10/18/20 1456 10/19/20 0358 10/19/20 0359 10/19/20 0811 10/19/20 1154  HGB 9.2*  --  8.6*  --  8.2*  --   --   --   HCT 26.4*  --  24.4*  --  22.9*  --   --   --   PLT 151  --  162  --  168  --   --   --   APTT 144*   < > 55* 70* 155*  --  125* 109*  HEPARINUNFRC 2.06*   < > 1.22* 1.06*  --  1.08*  --  0.93*  CREATININE 8.62*   < > 5.05* 3.91* 2.60*  --   --   --    < > = values in this interval not displayed.    Estimated Creatinine Clearance: 23.6 mL/min (A) (by C-G formula based on SCr of 2.6 mg/dL (H)).  Assessment: Patient is a 87 yom that is being admitted for COVID and AKI. Patient takes Apixaban pta for aflutter. Pharmacy has been asked to dose heparin at this time due to concerns for taking oral meds. Last dose of Apixaban was at 1030 on 10/15/20  Heparin has been on hold due to prolonged levels.  Repeat heparin level and aPTT 1 hr after holding heparin remain prolonged but starting to trend down. Will continue to hold IV heparin and repeat in 4 hours. No signs of overt bleeding noted. Hgb 8.2 / Hct 22.9 (slow trend down). Platelets remain stable.  Dr. Valeta Harms made aware. CRRT continues with UF goal removal rate of net negative 50 ml/hr at this time.   Note Heparin level and aPTT appear to be correlating so likely need to use heparin level going forward -- will continue to monitor both on next check to confirm.   Goal of Therapy:  aPTT 66-102 seconds Monitor platelets by anticoagulation protocol: Yes   Plan:  Continue to hold IV Heparin.   Recheck aPTT and heparin level in 4 hours.  Will plan to resume IV Heparin when aPTT/HL have trended back down to goal.  Daily CBC, HL and APTT until correlated  Sloan Leiter, PharmD, BCPS, BCCCP Clinical Pharmacist Please refer to [facility] for [facility] numbers 10/19/2020 1:48 PM

## 2020-10-20 ENCOUNTER — Inpatient Hospital Stay (HOSPITAL_COMMUNITY): Payer: Medicare Other

## 2020-10-20 DIAGNOSIS — R6520 Severe sepsis without septic shock: Secondary | ICD-10-CM | POA: Diagnosis not present

## 2020-10-20 DIAGNOSIS — I4891 Unspecified atrial fibrillation: Secondary | ICD-10-CM | POA: Diagnosis not present

## 2020-10-20 DIAGNOSIS — A419 Sepsis, unspecified organism: Secondary | ICD-10-CM | POA: Diagnosis not present

## 2020-10-20 LAB — HEPARIN LEVEL (UNFRACTIONATED)
Heparin Unfractionated: 0.69 IU/mL (ref 0.30–0.70)
Heparin Unfractionated: 0.4 IU/mL (ref 0.30–0.70)

## 2020-10-20 LAB — CBC
HCT: 25.1 % — ABNORMAL LOW (ref 39.0–52.0)
Hemoglobin: 8.4 g/dL — ABNORMAL LOW (ref 13.0–17.0)
MCH: 36.1 pg — ABNORMAL HIGH (ref 26.0–34.0)
MCHC: 33.5 g/dL (ref 30.0–36.0)
MCV: 107.7 fL — ABNORMAL HIGH (ref 80.0–100.0)
Platelets: 146 10*3/uL — ABNORMAL LOW (ref 150–400)
RBC: 2.33 MIL/uL — ABNORMAL LOW (ref 4.22–5.81)
RDW: 15.9 % — ABNORMAL HIGH (ref 11.5–15.5)
WBC: 58 10*3/uL (ref 4.0–10.5)
nRBC: 0.3 % — ABNORMAL HIGH (ref 0.0–0.2)

## 2020-10-20 LAB — BASIC METABOLIC PANEL
Anion gap: 12 (ref 5–15)
BUN: 23 mg/dL (ref 8–23)
CO2: 26 mmol/L (ref 22–32)
Calcium: 7.2 mg/dL — ABNORMAL LOW (ref 8.9–10.3)
Chloride: 97 mmol/L — ABNORMAL LOW (ref 98–111)
Creatinine, Ser: 1.96 mg/dL — ABNORMAL HIGH (ref 0.61–1.24)
GFR, Estimated: 32 mL/min — ABNORMAL LOW (ref >60)
Glucose, Bld: 167 mg/dL — ABNORMAL HIGH (ref 70–99)
Potassium: 4.3 mmol/L (ref 3.5–5.1)
Sodium: 135 mmol/L (ref 135–145)

## 2020-10-20 LAB — GLUCOSE, CAPILLARY
Glucose-Capillary: 205 mg/dL — ABNORMAL HIGH (ref 70–99)
Glucose-Capillary: 143 mg/dL — ABNORMAL HIGH (ref 70–99)
Glucose-Capillary: 180 mg/dL — ABNORMAL HIGH (ref 70–99)
Glucose-Capillary: 170 mg/dL — ABNORMAL HIGH (ref 70–99)
Glucose-Capillary: 140 mg/dL — ABNORMAL HIGH (ref 70–99)
Glucose-Capillary: 143 mg/dL — ABNORMAL HIGH (ref 70–99)

## 2020-10-20 LAB — RENAL FUNCTION PANEL
Albumin: 2.5 g/dL — ABNORMAL LOW (ref 3.5–5.0)
Anion gap: 10 (ref 5–15)
BUN: 27 mg/dL — ABNORMAL HIGH (ref 8–23)
CO2: 25 mmol/L (ref 22–32)
Calcium: 7.5 mg/dL — ABNORMAL LOW (ref 8.9–10.3)
Chloride: 100 mmol/L (ref 98–111)
Creatinine, Ser: 1.89 mg/dL — ABNORMAL HIGH (ref 0.61–1.24)
GFR, Estimated: 34 mL/min — ABNORMAL LOW (ref >60)
Glucose, Bld: 163 mg/dL — ABNORMAL HIGH (ref 70–99)
Phosphorus: 2.4 mg/dL — ABNORMAL LOW (ref 2.5–4.6)
Potassium: 4.4 mmol/L (ref 3.5–5.1)
Sodium: 135 mmol/L (ref 135–145)

## 2020-10-20 LAB — PROCALCITONIN: Procalcitonin: 0.8 ng/mL

## 2020-10-20 LAB — MAGNESIUM: Magnesium: 2.4 mg/dL (ref 1.7–2.4)

## 2020-10-20 LAB — APTT
aPTT: 69 seconds — ABNORMAL HIGH (ref 24–36)
aPTT: 37 seconds — ABNORMAL HIGH (ref 24–36)

## 2020-10-20 LAB — PHOSPHORUS: Phosphorus: 2.6 mg/dL (ref 2.5–4.6)

## 2020-10-20 MED ORDER — CALCIUM GLUCONATE-NACL 2-0.675 GM/100ML-% IV SOLN
2.0000 g | Freq: Once | INTRAVENOUS | Status: AC
  Administered 2020-10-20: 2000 mg via INTRAVENOUS
  Filled 2020-10-20 (×2): qty 100

## 2020-10-20 MED ORDER — SODIUM CHLORIDE 0.9 % IV SOLN
1.0000 g | Freq: Three times a day (TID) | INTRAVENOUS | Status: DC
  Administered 2020-10-20 – 2020-10-24 (×13): 1 g via INTRAVENOUS
  Filled 2020-10-20 (×7): qty 1
  Filled 2020-10-20: qty 0.05
  Filled 2020-10-20 (×7): qty 1

## 2020-10-20 MED ORDER — DEXMEDETOMIDINE HCL IN NACL 400 MCG/100ML IV SOLN
0.4000 ug/kg/h | INTRAVENOUS | Status: DC
  Administered 2020-10-20: 0.5 ug/kg/h via INTRAVENOUS
  Administered 2020-10-21: 0.4 ug/kg/h via INTRAVENOUS
  Filled 2020-10-20 (×2): qty 100

## 2020-10-20 MED ORDER — INSULIN ASPART 100 UNIT/ML ~~LOC~~ SOLN
3.0000 [IU] | SUBCUTANEOUS | Status: DC
  Administered 2020-10-20 (×2): 6 [IU] via SUBCUTANEOUS
  Administered 2020-10-20 – 2020-10-21 (×2): 3 [IU] via SUBCUTANEOUS
  Administered 2020-10-21: 9 [IU] via SUBCUTANEOUS
  Administered 2020-10-21 (×2): 3 [IU] via SUBCUTANEOUS
  Administered 2020-10-21: 6 [IU] via SUBCUTANEOUS
  Administered 2020-10-21 – 2020-10-22 (×2): 3 [IU] via SUBCUTANEOUS
  Administered 2020-10-22 (×2): 6 [IU] via SUBCUTANEOUS
  Administered 2020-10-22 – 2020-10-23 (×7): 3 [IU] via SUBCUTANEOUS
  Administered 2020-10-24: 6 [IU] via SUBCUTANEOUS
  Administered 2020-10-24: 3 [IU] via SUBCUTANEOUS
  Administered 2020-10-24: 6 [IU] via SUBCUTANEOUS
  Administered 2020-10-24 – 2020-10-25 (×2): 3 [IU] via SUBCUTANEOUS
  Administered 2020-10-25: 6 [IU] via SUBCUTANEOUS
  Administered 2020-10-25: 3 [IU] via SUBCUTANEOUS
  Administered 2020-10-25: 6 [IU] via SUBCUTANEOUS
  Administered 2020-10-26: 3 [IU] via SUBCUTANEOUS
  Administered 2020-10-26 (×2): 6 [IU] via SUBCUTANEOUS
  Administered 2020-10-26: 3 [IU] via SUBCUTANEOUS
  Administered 2020-10-27 (×2): 6 [IU] via SUBCUTANEOUS
  Administered 2020-10-27: 3 [IU] via SUBCUTANEOUS
  Administered 2020-10-27: 6 [IU] via SUBCUTANEOUS
  Administered 2020-10-27 – 2020-10-28 (×2): 9 [IU] via SUBCUTANEOUS
  Administered 2020-10-28: 3 [IU] via SUBCUTANEOUS
  Administered 2020-10-28: 9 [IU] via SUBCUTANEOUS
  Administered 2020-10-29: 3 [IU] via SUBCUTANEOUS
  Administered 2020-10-29: 6 [IU] via SUBCUTANEOUS
  Administered 2020-10-29 – 2020-10-30 (×2): 3 [IU] via SUBCUTANEOUS
  Administered 2020-10-30: 6 [IU] via SUBCUTANEOUS
  Administered 2020-10-30: 9 [IU] via SUBCUTANEOUS
  Administered 2020-10-31 – 2020-11-03 (×6): 3 [IU] via SUBCUTANEOUS
  Administered 2020-11-04: 9 [IU] via SUBCUTANEOUS
  Administered 2020-11-04 – 2020-11-08 (×7): 3 [IU] via SUBCUTANEOUS
  Administered 2020-11-08: 6 [IU] via SUBCUTANEOUS
  Administered 2020-11-08: 3 [IU] via SUBCUTANEOUS
  Administered 2020-11-09: 6 [IU] via SUBCUTANEOUS
  Administered 2020-11-10: 3 [IU] via SUBCUTANEOUS

## 2020-10-20 MED ORDER — PANTOPRAZOLE SODIUM 40 MG PO TBEC
40.0000 mg | DELAYED_RELEASE_TABLET | Freq: Every day | ORAL | Status: DC
  Administered 2020-10-20: 40 mg via ORAL
  Filled 2020-10-20 (×2): qty 1

## 2020-10-20 MED ORDER — POLYETHYLENE GLYCOL 3350 17 G PO PACK: 17.0000 g | PACK | Freq: Two times a day (BID) | ORAL | Status: DC

## 2020-10-20 NOTE — Procedures (Signed)
Seen and evaluated on HD. Persistent pressors. All 4k. 555ml/hr pre-filter, 227ml/hr post filter, DFR 2L/hr. CTM no changes. UF as tolerated

## 2020-10-20 NOTE — Progress Notes (Addendum)
PCCM progress note  CT chest abdomen pelvis reviewed with findings of multifocal pneumonia, severe pancreatitis with no definite fluid collection around the pancreas.  There is lesion of low density in the iliac is muscle which looks nonspecific  Discussed with surgery who do not feel surgery is warranted.  Discussed with IR who feel this is nonspecific and elevated WBC count is more from pancreatitis.  Would not recommend a drain.  Can consider needle aspiration but patient is delirious and may not tolerate sedation.  Continue close monitoring We will discuss with pharmacy if we can broaden antibiotics to meropenem for now. Family updated over telephone. Discussed with wife and reviewed worsening clinical situation and poor prognosis. She is still hopeful for recovery and has requested full code.   The patient is critically ill with multiple organ system failure and requires high complexity decision making for assessment and support, frequent evaluation and titration of therapies, advanced monitoring, review of radiographic studies and interpretation of complex data.   Critical Care Time devoted to patient care services, exclusive of separately billable procedures, described in this note is 35 minutes.   Marshell Garfinkel MD [facility] Please see [email or web address] for pager details.  10/20/2020, 3:20 PM

## 2020-10-20 NOTE — Progress Notes (Signed)
Second dose of contrast is in. CT chest/abd scheduled for 1300. Modena Morrow E, RN 10/20/2020 11:59 AM

## 2020-10-20 NOTE — Progress Notes (Signed)
CRITICAL VALUE ALERT  Critical Value:  7  Date & Time Notied:  1/7 0835  Provider Notified: yes   Orders Received/Actions taken: pending

## 2020-10-20 NOTE — Progress Notes (Signed)
First bottle of contrast has been given. Next dose at 1200. Modena Morrow E, RN 10/20/2020 11:11 AM

## 2020-10-20 NOTE — Progress Notes (Signed)
[facility] Progress Note    Assessment/ Plan:   1. Acute renal failure - baseline cr 1.2 in June 2021, now with severe AKI in the setting of multiple insults including COVID, diarrhea, and pancreatitis (also was taking entresto).  Severe AKI with Cr up to 8.6, falling UOP CV started 1/4. Continue to try and pull small amount of fluid to remain net negative. Continue CV given still requiring norepi and vaso. Would like to see O2 status improve before switching to HD.  2. Acute pancreatitis: Noted on CT scan 10/16/20. GI has been consulted. 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  Had a hospitalization at [facility] for pancreatitis 2016 when peripancreatic lymph node leading to dx first discovered. Mgmt per primary. CT again today  3. Hypocalcemia: multifactorial. Corrects to 8.2 today (albumin from yesterday). CTM and replete PRN  4.  Diarrhea: likely  Due to #2 +/- COVID infection  5. Acute hypoxemic RF: likely aspiration PNA +/- COVID pneumonitis, abx per primary (on cefepime and falgyl currently). CT chest today  6. Shock: high WBC count, w/u per primary team. On antibiotics and pressors  7. COVID-19 infection: vaccinated and boosted- treatment per primary  8. Acute encephalopathy: multifactorial, likely COVID + acute illness. CTM  8.  Chronic systolic CHF: EF 05/18/15 on TTE 60-65% with no WMA. Fluid removal as above  9.  Afib: hep and amiodarone gtts  10.  Dispo: critically ill with MSOF in ICU    Subjective:    Continues on CV, minimal UOP, remains confused on HFNC. Continues on norepi and vaso. . -1L over last 24 hours.   Objective:   BP 135/72   Pulse (!) 129   Temp (!) 97.2 F (36.2 C) (Axillary)   Resp (!) 22   Ht 5\' 8"  (1.727 m)   Wt 106.1 kg   SpO2 90%   BMI 35.57 kg/m   Intake/Output Summary (Last 24 hours) at 10/20/2020 1125 Last data filed at 10/20/2020 1100 Gross per 24 hour   Intake 3978.63 ml  Output 4821 ml  Net -842.37 ml   Weight change: 0.5 kg  Physical Exam:    Gen: older gentleman, lying in bed, not interactive CVS: tachycardic, irregular Resp: tachypneic, mild iwob Abd: somewhat distended, nontender to palpation Ext: 2+ bilateral lower extremity edema NEURO: confused and agitated  Imaging: No results found.  Labs: BMET Recent Labs  Lab 10/17/20 0443 10/17/20 1619 10/18/20 0352 10/18/20 1116 10/18/20 1456 10/18/20 1909 10/19/20 0358 10/19/20 1701 10/20/20 0715  NA 127* 129* 132*  --  134*  --  136 135 135  K 3.8 4.0 3.7  --  3.6  --  3.4* 3.6 4.3  CL 98 98 97*  --  96*  --  97* 96* 97*  CO2 11* 12* 19*  --  23  --  28 27 26   GLUCOSE 173* 191* 148*  --  190*  --  204* 187* 167*  BUN 108* 94* 63*  --  44*  --  31* 28* 23  CREATININE 8.62* 7.42* 5.05*  --  3.91*  --  2.60* 2.32* 1.96*  CALCIUM 5.4* 6.0* 6.8*  --  7.3*  --  6.6* 6.7* 7.2*  PHOS  --  8.3* 5.4* 4.2 4.2 4.1 2.8 2.9  2.9 2.6   CBC Recent Labs  Lab 10/16/20 0949 10/17/20 0443 10/18/20 0352 10/19/20 0358 10/20/20 0715  WBC 50.7* 46.4* 43.8* 46.0*  58.0*  NEUTROABS 39.1* 34.6* 29.1* 38.6*  --   HGB 10.9* 9.2* 8.6* 8.2* 8.4*  HCT 31.9* 26.4* 24.4* 22.9* 25.1*  MCV 105.3* 104.3* 101.7* 100.9* 107.7*  PLT 170 151 162 168 146*    Medications:    . B-complex with vitamin C  1 tablet Per Tube Daily  . Chlorhexidine Gluconate Cloth  6 each Topical Daily  . feeding supplement (PROSource TF)  45 mL Per Tube BID  . Gerhardt's butt cream   Topical TID  . insulin aspart  3-9 Units Subcutaneous Q4H  . mouth rinse  15 mL Mouth Rinse BID  . pantoprazole  40 mg Oral Daily  . polyethylene glycol  17 g Per Tube BID  . predniSONE  40 mg Per Tube Q breakfast   Followed by  . [START ON 10/23/2020] predniSONE  30 mg Per Tube Q breakfast   Followed by  . [START ON 10/26/2020] predniSONE  20 mg Per Tube Q breakfast   Followed by  . [START ON 10/29/2020] predniSONE  10 mg Per  Tube Q breakfast  . sodium chloride flush  10-40 mL Intracatheter Q12H  . sodium chloride flush  3 mL Intravenous Q12H  . zinc sulfate  220 mg Per Tube Daily      Reesa Chew  10/20/2020, 11:25 AM

## 2020-10-20 NOTE — Progress Notes (Signed)
Physical Therapy Treatment Patient Details Name: Raymond Hurley. MRN: [id number] DOB: 1931/01/26 Today's Date: 10/20/2020    History of Present Illness Pt is a 87 y.o. male admitted 1/1 with hypotension due to diarrhea with Covid 19 (vaccinated and boosted). 1/3: increased confusion with Aspiration PNA, multiple organ failure, pancreatitis. 1/4 AFib with RVR with tranfer to ICU and CRRT initiated. PMhx: metastatic melanoma with pancreatic head mass, A fib, CHF (EF 45% as of 08/2019)    PT Comments    Pt pleasantly confused and unaware of CRRT, lack of catheter, situation and time. Pt able to follow commands, sit EOB and stand x 2. Mobility limited by lines and need for transition back to bed for CT. Pt with cognitive and mobility deficits who at this time will require more than HHPT and SNF recommended until pt can again walk and demonstrate supervision level function. Will continue to work toward increased mobility and function.     Follow Up Recommendations  SNF;Supervision/Assistance - 24 hour     Equipment Recommendations  Rolling walker with 5" wheels    Recommendations for Other Services       Precautions / Restrictions Precautions Precautions: Fall;Other (comment) Precaution Comments: CRRT    Mobility  Bed Mobility Overal bed mobility: Needs Assistance Bed Mobility: Supine to Sit;Sit to Supine     Supine to sit: Min assist;+2 for safety/equipment;HOB elevated Sit to supine: Mod assist;+2 for physical assistance;+2 for safety/equipment   General bed mobility comments: assist with cues and increased time to pivot to EOB, +2 to manage lines and for safety. Return to bed with assist to lift legs to surface  Transfers Overall transfer level: Needs assistance Equipment used: 2 person hand held assist Transfers: Sit to/from Stand Sit to Stand: Min assist;+2 safety/equipment         General transfer comment: pt able to stand with min +2 assist x 2 trials from bed  and stand for grossly 2 min each trial with side stepping toward HOB. Unable to pivot to chair due to pending test  Ambulation/Gait                 Stairs             Wheelchair Mobility    Modified Rankin (Stroke Patients Only)       Balance Overall balance assessment: Needs assistance Sitting-balance support: No upper extremity supported;Feet supported Sitting balance-Leahy Scale: Fair Sitting balance - Comments: EOb with guarding for lines   Standing balance support: Bilateral upper extremity supported;Single extremity supported Standing balance-Leahy Scale: Poor Standing balance comment: single and bil UE support in standing                            Cognition Arousal/Alertness: Awake/alert Behavior During Therapy: Flat affect Overall Cognitive Status: Impaired/Different from baseline Area of Impairment: Awareness;Memory;Safety/judgement;Orientation;Attention;Problem solving                 Orientation Level: Disoriented to;Time;Place Current Attention Level: Sustained Memory: Decreased short-term memory   Safety/Judgement: Decreased awareness of deficits;Decreased awareness of safety Awareness: Intellectual Problem Solving: Slow processing;Difficulty sequencing;Requires verbal cues        Exercises General Exercises - Lower Extremity Short Arc Quad: AROM;Both;Seated;10 reps Heel Slides: AROM;Both;Supine;10 reps Hip ABduction/ADduction: AROM;Both;Supine;10 reps Hip Flexion/Marching: AAROM;Both;Seated;10 reps    General Comments        Pertinent Vitals/Pain Pain Assessment: No/denies pain    Home Living  Prior Function            PT Goals (current goals can now be found in the care plan section) Progress towards PT goals: Progressing toward goals    Frequency    Min 3X/week      PT Plan Current plan remains appropriate;Discharge plan needs to be updated    Co-evaluation               AM-PAC PT "6 Clicks" Mobility   Outcome Measure  Help needed turning from your back to your side while in a flat bed without using bedrails?: A Little Help needed moving from lying on your back to sitting on the side of a flat bed without using bedrails?: A Little Help needed moving to and from a bed to a chair (including a wheelchair)?: A Lot Help needed standing up from a chair using your arms (e.g., wheelchair or bedside chair)?: A Little Help needed to walk in hospital room?: A Lot Help needed climbing 3-5 steps with a railing? : Total 6 Click Score: 14    End of Session Equipment Utilized During Treatment: Oxygen Activity Tolerance: Patient tolerated treatment well Patient left: in bed;with call bell/phone within reach;with bed alarm set Nurse Communication: Mobility status PT Visit Diagnosis: Other abnormalities of gait and mobility (R26.89);Difficulty in walking, not elsewhere classified (R26.2);Muscle weakness (generalized) (M62.81)     Time: 4287-6811 PT Time Calculation (min) (ACUTE ONLY): 23 min  Charges:  $Therapeutic Exercise: 8-22 mins $Therapeutic Activity: 8-22 mins                     Raymond Hurley Hurley, PT Acute Rehabilitation Services Pager: [phone] Office: [place] Raymond Hurley 10/20/2020, 12:04 PM

## 2020-10-20 NOTE — Progress Notes (Signed)
NAME:  Raymond Hurley., MRN:  [id number], DOB:  10/08/1931, LOS: 6 ADMISSION DATE:  10/14/2020, CONSULTATION DATE:  10/14/2020 REFERRING MD:  [place] [facility], CHIEF COMPLAINT:  Hypotension.    HPI/course in hospital   87 year old man who is hypotensive in the context of acute diarrhea on 1/1 was admitted w/ dx of COVID and acute dehydration from acute diarrheal illness. Of note he was fully vaccinated and had received his booster.  Critical care was consulted given hypotension however this responded to IV hydration and supportive care. Hospital course from 1/1 through 1/3:  Subsequently found to have aspiration pneumonia,  And, worsening delirium, and multiple organ failure, on 1/3  had worsening work of breathing more confusion, and CT of abdomen showed asymptomatic pancreatitis.  Given advanced age and multiple organ dysfunction in addition to concern about further clinical decline critical care asked to reevaluate  Events:  1/4 > hypotension and A.fib RVR.  Transferring to ICU. Started CRRT  Unasyn 1/2 >> 1/4 Cefepime 1/4 >> Flagyl 1/6 >>  Past Medical History  Atrial fibrillation, melanoma, vitamin B12 deficiency.  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 rate (!) 24, height 5\' 8"  (1.727 m), weight 106.1 kg, SpO2 96 %.        Intake/Output Summary (Last 24 hours) at 10/20/2020 0924 Last data filed at 10/20/2020 0900 Gross per 24 hour  Intake 3514.43 ml  Output 4532 ml  Net -1017.57 ml   Filed Weights   10/18/20 0348 10/19/20 0406 10/20/20 0500  Weight: 106.4 kg 105.6 kg 106.1 kg    Examination:  Gen:      No acute distress HEENT:  EOMI, sclera anicteric Neck:     No masses; no thyromegaly Lungs:    Clear to auscultation bilaterally; normal  respiratory effort CV:         Irregular and rhythm; no murmurs Abd:      + bowel sounds; soft, non-tender; no palpable masses, no distension Ext:    No edema; adequate peripheral perfusion Skin:      Warm and dry; no rash Neuro: Awake, answers questions.  Mild confusion  Labs/imaging reviewed Significant for BUN/creatinine 23/1.96 WBC increased to 58, hemoglobin 8.4, platelets 146 No new imaging  Assessment & Plan:   Acute renal failure, requiring CVVHD suspect all secondary to recent diarrheal illness and volume depletion Plan: Continue CVVH  Hypocalcemia - 2/2 above + pancreatitis. Plan Continue repletion  Acute hypoxic respiratory failure secondary to aspiration pneumonia Increasing leukocytosis Plan On cefepime now.  Flagyl added yesterday Follow cultures  Systolic cardiomyopathy with hx PAF - now in A.fib with RVR.  Echo 1/3 with EF 55 - 60% Plan Heparin, amio drip  COVID-19 infection Plan Continue steroid taper  Acute metabolic encephalopathy,  Multifactorial secondary to metabolic acidosis, uremia, possible sepsis Plan CRRT   Acute pancreatitis - unclear etiology.  Lipase, trigs, Tbili normal.  Denies EtOH use.  Of note, does have a hx of metastatic melanoma with mets to pancreas, s/p 30 infusions of Pembrolizumab in July 2018.Marland Kitchen Plan Follow-up CT abdomen pelvis to look for fluid collection.  Concern for intra-abdominal infection given leukocytosis Flagyl added yesterday  Best practice (evaluated daily)  Diet: Tube feeds Pain/Anxiety/Delirium protocol (if indicated): NA VAP protocol (if indicated):  NA DVT prophylaxis: heparin GI prophylaxis: PPI  Glucose control: SSI  Mobility: BR Disposition:ICU  Goals of Care:  Last date of multidisciplinary goals of care discussion: Spoke with wife on 1/7 Family and staff present: NA Summary of discussion: continue full support  Follow up goals of care discussion due: in 7 days  Code Status: FULL   The patient  is critically ill with multiple organ system failure and requires high complexity decision making for assessment and support, frequent evaluation and titration of therapies, advanced monitoring, review of radiographic studies and interpretation of complex data.   Critical Care Time devoted to patient care services, exclusive of separately billable procedures, described in this note is 35 minutes.   Marshell Garfinkel MD [facility] Please see [email or web address] for pager details.  10/20/2020, 9:26 AM

## 2020-10-20 NOTE — Progress Notes (Signed)
ANTICOAGULATION CONSULT NOTE  Pharmacy Consult for Heparin Indication: atrial fibrillation  Patient Measurements: Height: 5\' 8"  (172.7 cm) Weight: 106.1 kg (233 lb 14.5 oz) IBW/kg (Calculated) : 68.4 Heparin Dosing Weight: 88.5 kg  Vital Signs: Temp: 98.1 F (36.7 C) (01/07 1512) Temp Source: Axillary (01/07 1512) BP: 89/75 (01/07 1800) Pulse Rate: 142 (01/07 1800)  Labs: Recent Labs    10/18/20 0352 10/18/20 1456 10/19/20 0358 10/19/20 0359 10/19/20 1701 10/20/20 0308 10/20/20 0715 10/20/20 1643  HGB 8.6*  --  8.2*  --   --   --  8.4*  --   HCT 24.4*  --  22.9*  --   --   --  25.1*  --   PLT 162  --  168  --   --   --  146*  --   APTT 55*   < > 155*   < > 46* 69*  --  37*  HEPARINUNFRC 1.22*   < >  --    < > 0.61 0.69  --  0.40  CREATININE 5.05*   < > 2.60*  --  2.32*  --  1.96* 1.89*   < > = values in this interval not displayed.    Estimated Creatinine Clearance: 32.5 mL/min (A) (by C-G formula based on SCr of 1.89 mg/dL (H)).  Assessment: 87 yr old man was admitted for COVID and AKI. Patient was taking apixaban PTA for aflutter (last dose was at 1030 on 10/15/20). Pharmacy has been asked to dose heparin at this time due to concerns for taking oral meds. Due to recent apixaban exposure, will monitor anticoagulation using aPTT until heparin levels and aPTT correlate.  Confirmatory aPTT on heparin infusion at 750 units/hr has dropped to 37 sec, which is below the goal range for this pt; heparin level drawn at the same time was 0.40 units/ml, indicating likely continued effect of apixaban on heparin level. H/H 8.4/25.1, plt 146 (CBC ~stable). Per RN, no issues with IV or bleeding observed.  Goal of Therapy:  APTT:  66-102 seconds Heparin level:  0.3-0.7 units/ml Monitor platelets by anticoagulation protocol: Yes   Plan:  Increase heparin infusion to 1000 units/hr Monitor daily aPTT, heparin level, CBC Monitor for signs/symptoms of bleeding  Gillermina Hu, PharmD,  BCPS, [facility] Clinical Pharmacist 10/20/2020

## 2020-10-20 NOTE — Progress Notes (Signed)
ANTICOAGULATION CONSULT NOTE - Follow Up Consult  Pharmacy Consult for heparin Indication: atrial fibrillation  Labs: Recent Labs    10/17/20 0443 10/17/20 1619 10/18/20 0352 10/18/20 1456 10/19/20 0358 10/19/20 0359 10/19/20 1154 10/19/20 1701 10/20/20 0308  HGB 9.2*  --  8.6*  --  8.2*  --   --   --   --   HCT 26.4*  --  24.4*  --  22.9*  --   --   --   --   PLT 151  --  162  --  168  --   --   --   --   APTT 144*   < > 55* 70* 155*   < > 109* 46* 69*  HEPARINUNFRC 2.06*   < > 1.22* 1.06*  --    < > 0.93* 0.61 0.69  CREATININE 8.62*   < > 5.05* 3.91* 2.60*  --   --  2.32*  --    < > = values in this interval not displayed.    Assessment/Plan:  87yo male therapeutic on heparin after resumed. Will continue gtt at current rate and confirm stable with additional PTT.   Wynona Neat, PharmD, BCPS  10/20/2020,4:13 AM

## 2020-10-20 NOTE — Progress Notes (Signed)
Nutrition Follow-up  DOCUMENTATION CODES:   Not applicable  INTERVENTION:   -Magic cup TID with meals, each supplement provides 290 kcal and 9 grams of protein  Tube Feeding via Cortrak: Vital AF 1.2 at 65 ml/hr Pro-Source TF 45 mL BID Provides 139 g of protein, 1952 kcals, 1264 mL of free water Meets 100% estimated calorie and protein needs  -Continue B-complex with C to replace losses while on CRRT  NUTRITION DIAGNOSIS:   Inadequate oral intake related to acute illness as evidenced by NPO status.  Progressing; advanced to dysphagia 3 diet with nectar thick liquids on 10/19/20 and also receiving TF  GOAL:   Patient will meet greater than or equal to 90% of their needs  Met with TF  MONITOR:   TF tolerance,Labs,Weight trends,Diet advancement,Skin  REASON FOR ASSESSMENT:   Rounds (CRRT, plan for Cortrak)    ASSESSMENT:   87 yo male admitted with acute diarrhea on 1/1 with diagnosis of COVID and acute dehydration and subsequently found to have aspiration pneumonia. PMH includes metastatic melanoma with mets to pancreas (follwed at [facility]), B12 deficiency, systolic cardiomyopathy  1/4- CRRT initiated 1/5- cortrak placed (tip of tube in stomach), TF initiated 1/6- s/p BSE- advanced to dysphagia 3 diet with nectar thick liquids 1/7- CT of abdomen of pelvis revealed large amount og inflammatory changes arounf the pancreas concerning for severe acute pancratitis  Reviewed I/O's: -917 ml x 24 hours and +5.3 L since admission  UOP: 0 ml x 24 hours  Per palliative care notes, pt and family desire full scope care.   Per GI notes,plan for repeat CT of abdomen and pelvis without contrast to follow-up on pancreatitis.   Pt remains on CRRT. He is still requiring pressors. Per nephrology notes, will consider switching to HD once O2 sats improve.   Per chart review, pt more alert and interactive. He has been advanced to a dysphagia 3 diet within nectar thick liquids, however, no  intake recorded. Given increased needs of CRRT and possible pancreatitis, will continue will TF to optimize nutritional status.   Medications reviewed and include B-complex with vitamin C, miralax, prednisone, zinc sulfate, amiodarone, norepinephrine, and vasopressin.    Lab Results  Component Value Date   HGBA1C 5.7 (H) 10/14/2020   PTA DM medications are .   Labs reviewed: CBGS: 143-205 (inpatient orders for glycemic control are 3-9 units insulin aspart every 4 hours).   Diet Order:   Diet Order            DIET DYS 3 Room service appropriate? Yes; Fluid consistency: Nectar Thick  Diet effective now                 EDUCATION NEEDS:   Not appropriate for education at this time  Skin:  Skin Assessment: Reviewed RN Assessment  Last BM:  10/19/20  Height:   Ht Readings from Last 1 Encounters:  10/17/20 '5\' 8"'  (1.727 m)    Weight:   Wt Readings from Last 1 Encounters:  10/20/20 106.1 kg   BMI:  Body mass index is 35.57 kg/m.  Estimated Nutritional Needs:   Kcal:  1900-2300 kcals  Protein:  115-140 g  Fluid:  >/= 2L    Loistine Chance, RD, LDN, [place] Registered Dietitian II Certified Diabetes Care and Education Specialist Please refer to AMION for RD and/or RD on-call/weekend/after hours pager

## 2020-10-20 NOTE — Progress Notes (Signed)
-  Chart reviewed.  Patient not seen.  Discussed with RN.  No acute GI issues based on discussion with RN except for constipation.    Assessment  -----------------  -Abnormal CT scan showing large amount of inflammatory changes around the pancreas concerning for severe acute pancreatitis.  CT scan was without contrast.  Patient denied any abdominal pain.  Normal lipase  -Atrial fibrillation  with hypotension.  Requiring transfer to ICU. -Severe acute kidney injury -Acute respiratory failure with aspiration pneumonia along with COVID-19 infection -CHF  -Hypocalcemia.  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  Last CT scan in October 2021 at [facility] showed pancreatic calcification.   -Diarrhea.  Resolved.  Now complaining of constipation.   Recommendations  --------------------------  -Repeat CT abdomen pelvis without contrast for follow-up on pancreatitis has been ordered -Continue supportive care for now -Recommend MRI MRCP with contrast once acute kidney injury and other acute issues are resolved.Marland Kitchen  MRI can be done in next 2 to 4 weeks.  -Okay to have low-fat diet from GI standpoint. -Increase MiraLAX to twice a day for constipation - GI will follow from distance.   Otis Brace MD, [place] 10/20/2020, 10:43 AM  Contact #  [phone]

## 2020-10-20 NOTE — Progress Notes (Signed)
Progress Note  Patient Name: Raymond Hurley. Date of Encounter: 10/20/2020  [facility] Cardiologist: Kirk Ruths, MD   Subjective   Denies angina and dyspnea. Some signs of improvement: off norepi and only intermittently requiring vasopressin support to allow CRRT.  Able to ultrafilter a little more today with improved BP. -951mL for the shift, 5.4 L +ve for the admission. Heart rates are slightly better, but still AFib w RVR in 100-120s on IV amio. Remains alert, partially oriented, very restless and fidgety. Anuric.  WBC 58,000.  Inpatient Medications    Scheduled Meds: . B-complex with vitamin C  1 tablet Per Tube Daily  . Chlorhexidine Gluconate Cloth  6 each Topical Daily  . feeding supplement (PROSource TF)  45 mL Per Tube BID  . Gerhardt's butt cream   Topical TID  . insulin aspart  2-6 Units Subcutaneous Q4H  . mouth rinse  15 mL Mouth Rinse BID  . pantoprazole  40 mg Oral Daily  . polyethylene glycol  17 g Per Tube BID  . predniSONE  40 mg Per Tube Q breakfast   Followed by  . [START ON 10/23/2020] predniSONE  30 mg Per Tube Q breakfast   Followed by  . [START ON 10/26/2020] predniSONE  20 mg Per Tube Q breakfast   Followed by  . [START ON 10/29/2020] predniSONE  10 mg Per Tube Q breakfast  . sodium chloride flush  10-40 mL Intracatheter Q12H  . sodium chloride flush  3 mL Intravenous Q12H  . zinc sulfate  220 mg Per Tube Daily   Continuous Infusions: .  prismasol BGK 4/2.5 500 mL/hr at 10/20/20 0219  .  prismasol BGK 4/2.5 200 mL/hr at 10/20/20 0918  . sodium chloride 10 mL/hr at 10/20/20 1000  . amiodarone 30 mg/hr (10/20/20 1000)  . calcium gluconate    . ceFEPime (MAXIPIME) IV Stopped (10/20/20 0946)  . feeding supplement (VITAL AF 1.2 CAL) 1,000 mL (10/20/20 0441)  . heparin 750 Units/hr (10/20/20 1000)  . metronidazole Stopped (10/20/20 0912)  . norepinephrine (LEVOPHED) Adult infusion Stopped (10/20/20 6948)  . prismasol BGK 4/2.5 2,000  mL/hr at 10/20/20 0825  . vasopressin 0.04 Units/min (10/20/20 1000)   PRN Meds: sodium chloride, acetaminophen (TYLENOL) oral liquid 160 mg/5 mL, albuterol, chlorpheniramine-HYDROcodone, fentaNYL (SUBLIMAZE) injection, guaiFENesin-dextromethorphan, heparin, heparin, melatonin, oxyCODONE, Resource ThickenUp Clear, sodium chloride flush, triamcinolone   Vital Signs    Vitals:   10/20/20 0915 10/20/20 0930 10/20/20 0945 10/20/20 1000  BP: (!) 88/42 104/70 120/61 135/72  Pulse: (!) 114 (!) 122 (!) 116 (!) 129  Resp: (!) 21 (!) 22    Temp:      TempSrc:      SpO2: 96% 93% 92% 90%  Weight:      Height:        Intake/Output Summary (Last 24 hours) at 10/20/2020 1008 Last data filed at 10/20/2020 1000 Gross per 24 hour  Intake 3416.53 ml  Output 4406 ml  Net -989.47 ml   Last 3 Weights 10/20/2020 10/19/2020 10/18/2020  Weight (lbs) 233 lb 14.5 oz 232 lb 12.9 oz 234 lb 9.1 oz  Weight (kg) 106.1 kg 105.6 kg 106.4 kg      Telemetry    AFib w RVR - Personally Reviewed  ECG    No new tracing - Personally Reviewed  Physical Exam  Restless and fidgety. Partly oriented (knows he is in hospital, but thinks it's in [place]) GEN: No acute distress.   Neck: No JVD  Cardiac: RRR, no murmurs, rubs, or gallops.  Respiratory: Clear to auscultation bilaterally. GI: Soft, nontender, non-distended  MS: generalized edema; No deformity. Neuro:  Nonfocal  Psych: Normal affect   Labs    High Sensitivity Troponin:   Recent Labs  Lab 10/14/20 0144 10/14/20 0344  TROPONINIHS 50* 61*      Chemistry Recent Labs  Lab 10/17/20 0443 10/17/20 1619 10/18/20 0352 10/18/20 1456 10/19/20 0358 10/19/20 1701 10/20/20 0715  NA 127*   < > 132* 134* 136 135 135  K 3.8   < > 3.7 3.6 3.4* 3.6 4.3  CL 98   < > 97* 96* 97* 96* 97*  CO2 11*   < > 19* 23 28 27 26   GLUCOSE 173*   < > 148* 190* 204* 187* 167*  BUN 108*   < > 63* 44* 31* 28* 23  CREATININE 8.62*   < > 5.05* 3.91* 2.60* 2.32* 1.96*   CALCIUM 5.4*   < > 6.8* 7.3* 6.6* 6.7* 7.2*  PROT 4.9*  --  5.2*  --  4.9*  --   --   ALBUMIN 2.3*   < > 2.9* 2.7* 2.5* 2.6*  --   AST 50*  --  51*  --  53*  --   --   ALT 40  --  42  --  46*  --   --   ALKPHOS 58  --  55  --  67  --   --   BILITOT 1.1  --  1.4*  --  0.9  --   --   GFRNONAA 5*   < > 10* 14* 23* 27* 32*  ANIONGAP 18*   < > 16* 15 11 12 12    < > = values in this interval not displayed.     Hematology Recent Labs  Lab 10/18/20 0352 10/19/20 0358 10/20/20 0715  WBC 43.8* 46.0* 58.0*  RBC 2.40* 2.27* 2.33*  HGB 8.6* 8.2* 8.4*  HCT 24.4* 22.9* 25.1*  MCV 101.7* 100.9* 107.7*  MCH 35.8* 36.1* 36.1*  MCHC 35.2 35.8 33.5  RDW 14.9 15.1 15.9*  PLT 162 168 146*    BNP Recent Labs  Lab 10/17/20 0443 10/18/20 0352 10/19/20 0359  BNP 262.0* 520.1* 339.3*     DDimer  Recent Labs  Lab 10/17/20 0443 10/18/20 0352 10/19/20 0358  DDIMER 9.79* 7.26* 4.02*     Radiology    DG Abd Portable 1V  Result Date: 10/18/2020 CLINICAL DATA:  Feeding tube placement. EXAM: PORTABLE ABDOMEN - 1 VIEW COMPARISON:  October 15, 2020. FINDINGS: The bowel gas pattern is normal. Distal tip of feeding tube is seen in expected position of distal stomach. No radio-opaque calculi or other significant radiographic abnormality are seen. IMPRESSION: Distal tip of feeding tube seen in expected position of distal stomach. Electronically Signed   By: Marijo Conception M.D.   On: 10/18/2020 10:46    Cardiac Studies   Echocardiogram 10/16/220: Impressions: 1. Left ventricular ejection fraction, by estimation, is 55 to 60%. The  left ventricle has normal function. The left ventricle has no regional  wall motion abnormalities. Left ventricular diastolic function could not  be evaluated.  2. Right ventricular systolic function is normal. The right ventricular  size is normal. There is normal pulmonary artery systolic pressure. The  estimated right ventricular systolic pressure is 54.6 mmHg.   3. Left atrial size was mildly dilated.  4. Right atrial size was mildly dilated.  5. The mitral valve  is normal in structure. No evidence of mitral valve  regurgitation. No evidence of mitral stenosis.  6. The aortic valve is normal in structure. Aortic valve regurgitation is  not visualized. No aortic stenosis is present.  7. The inferior vena cava is normal in size with greater than 50%  respiratory variability, suggesting right atrial pressure of 3 mmHg.   Patient Profile     87 y.o. 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 Patient was admitted on 10/14/2018 with TIA after presenting mental status and right facial droop. He was found to have severe AKI requiring CRRT and suspected aspiration pneumonia as well as radiological findings suggestive of severe acute pancreatitis. Also found to be positive for COVID. Cardiology for further management of CHF and atrial fibrillation.  Assessment & Plan    Septic Shock/Aspiration Pneumonia - improving, currently off pressors.  - leukemoid reaction  Chronic Combined CHF/ Non-Ischemic Cardiomyopathy - clearly still hypervolemic after resuscitation for shock -EF as low as 20-25% in 03/2019 but improved to 45% on Echo in 08/2019. Echo this admission showed LVEF of 55-60% with normal wall motion. Evolution over time is highly suggestive of resolving tachycardia-related cardiomyopathy. Cardiac cath in 04/2019 showed only minimal CAD. - previously on Entresto (no need to restart even if renal function recovers) and beta blockers (once BP allows will plan to restart for rate control).  Persistent Atrial Fibrillation -  History of paroxysmal atrial flutter s/p DCCV in 05/2019. This admission has had atrial fibrillation with mildly elevated rates.  - Continue IV Amiodarone, switch to PO once more stable. Add beta blockers once BP allows. - On Eliquis at home but currently on IV Heparin here.  Demand Ischemia - High-sensitivity troponin mildly elevated and flat at 50 >>61.  Demand ischemia in setting of underlying illness with shock and tachyarrhythmia.  Anuric RF - on CRRT  TIA - Patient presented with facial droop and altered mental status.  - Head CT/Brain MRI unremarkable. - Was already on Eliquis.  Otherwise, per primary team: - Hypocalcemia - Acute pancreatitis - Acute metabolic encephalopathy  - Metabolic acidosis - COVID    CRITICAL CARE Performed by: Dani Gobble Ryoma Nofziger   Total critical care time: 30 minutes  Critical care time was exclusive of separately billable procedures and treating other patients.  Critical care was necessary to treat or prevent imminent or life-threatening deterioration.  Critical care was time spent personally by me on the following activities: development of treatment plan with patient and/or surrogate as well as nursing, discussions with consultants, evaluation of patient's response to treatment, examination of patient, obtaining history from patient or surrogate, ordering and performing treatments and interventions, ordering and review of laboratory studies, ordering and review of radiographic studies, pulse oximetry and re-evaluation of patient's condition.   For questions or updates, please contact Francis Please consult [email or web address] for contact info under        Signed, Sanda Klein, MD  10/20/2020, 10:08 AM

## 2020-10-20 NOTE — Progress Notes (Signed)
Pharmacy Antibiotic Note  Raymond Hurley. is a 87 y.o. male admitted on 10/14/2020 with pneumonia.  Patient with acutely worsening hypotension, signifigant leukocytosis. Pharmacy has been consulted for Merrem dosing. Patient remains on CRRT - running well.  Plan: Merrem 1g IV every 8 hours while on CRRT Follow-up CRRT/Nephrology plans   Height: 5\' 8"  (172.7 cm) Weight: 106.1 kg (233 lb 14.5 oz) IBW/kg (Calculated) : 68.4  Temp (24hrs), Avg:97.6 F (36.4 C), Min:97.2 F (36.2 C), Max:98.1 F (36.7 C)  Recent Labs  Lab 10/14/20 0144 10/14/20 0412 10/14/20 0909 10/16/20 0949 10/17/20 0443 10/17/20 1619 10/18/20 0352 10/18/20 1456 10/19/20 0358 10/19/20 1701 10/20/20 0715  WBC  --   --    < > 50.7* 46.4*  --  43.8*  --  46.0*  --  58.0*  CREATININE  --   --    < > 7.98* 8.62*   < > 5.05* 3.91* 2.60* 2.32* 1.96*  LATICACIDVEN 2.5* 1.8  --   --   --   --   --   --   --   --   --    < > = values in this interval not displayed.    Estimated Creatinine Clearance: 31.4 mL/min (A) (by C-G formula based on SCr of 1.96 mg/dL (H)).    Allergies  Allergen Reactions  . Povidone-Iodine Swelling  . Tape Swelling  . Bee Venom Rash  . Covid-19 Mrna Vacc (Moderna) Hives and Rash    Rash came after booster shot. Pt was fine with 1st and 2nd    Antimicrobials this admission: Unasyn 1/2 >> 1/4 Ceftriaxone 1/1 >> 1/3 Doxycycline 1/2 >> 1/3 Cefepime 1/4 >>1/7 Flagyl 1/6 >>1/7 Merrem 1/7 >>  Microbiology results: 1/1 BCx: no growth 3 days 1/2 MRSA PCR: negative  Thank you for allowing pharmacy to be a part of this patient's care.  Norina Buzzard, PharmD PGY1 Pharmacy Resident 10/20/2020 3:30 PM

## 2020-10-21 ENCOUNTER — Inpatient Hospital Stay (HOSPITAL_COMMUNITY): Payer: Medicare Other

## 2020-10-21 ENCOUNTER — Encounter (HOSPITAL_COMMUNITY): Payer: Self-pay | Admitting: Internal Medicine

## 2020-10-21 DIAGNOSIS — R6520 Severe sepsis without septic shock: Secondary | ICD-10-CM | POA: Diagnosis not present

## 2020-10-21 DIAGNOSIS — A419 Sepsis, unspecified organism: Secondary | ICD-10-CM | POA: Diagnosis not present

## 2020-10-21 LAB — GLUCOSE, CAPILLARY
Glucose-Capillary: 117 mg/dL — ABNORMAL HIGH (ref 70–99)
Glucose-Capillary: 138 mg/dL — ABNORMAL HIGH (ref 70–99)
Glucose-Capillary: 145 mg/dL — ABNORMAL HIGH (ref 70–99)
Glucose-Capillary: 150 mg/dL — ABNORMAL HIGH (ref 70–99)
Glucose-Capillary: 176 mg/dL — ABNORMAL HIGH (ref 70–99)
Glucose-Capillary: 204 mg/dL — ABNORMAL HIGH (ref 70–99)

## 2020-10-21 LAB — RENAL FUNCTION PANEL
Albumin: 2.5 g/dL — ABNORMAL LOW (ref 3.5–5.0)
Albumin: 2.4 g/dL — ABNORMAL LOW (ref 3.5–5.0)
Anion gap: 10 (ref 5–15)
Anion gap: 15 (ref 5–15)
BUN: 28 mg/dL — ABNORMAL HIGH (ref 8–23)
BUN: 32 mg/dL — ABNORMAL HIGH (ref 8–23)
CO2: 25 mmol/L (ref 22–32)
CO2: 17 mmol/L — ABNORMAL LOW (ref 22–32)
Calcium: 7.5 mg/dL — ABNORMAL LOW (ref 8.9–10.3)
Calcium: 7.4 mg/dL — ABNORMAL LOW (ref 8.9–10.3)
Chloride: 100 mmol/L (ref 98–111)
Chloride: 100 mmol/L (ref 98–111)
Creatinine, Ser: 1.77 mg/dL — ABNORMAL HIGH (ref 0.61–1.24)
Creatinine, Ser: 1.61 mg/dL — ABNORMAL HIGH (ref 0.61–1.24)
GFR, Estimated: 37 mL/min — ABNORMAL LOW (ref >60)
GFR, Estimated: 41 mL/min — ABNORMAL LOW (ref >60)
Glucose, Bld: 147 mg/dL — ABNORMAL HIGH (ref 70–99)
Glucose, Bld: 194 mg/dL — ABNORMAL HIGH (ref 70–99)
Phosphorus: 2.2 mg/dL — ABNORMAL LOW (ref 2.5–4.6)
Phosphorus: 3.6 mg/dL (ref 2.5–4.6)
Potassium: 4.3 mmol/L (ref 3.5–5.1)
Potassium: 5.7 mmol/L — ABNORMAL HIGH (ref 3.5–5.1)
Sodium: 135 mmol/L (ref 135–145)
Sodium: 132 mmol/L — ABNORMAL LOW (ref 135–145)

## 2020-10-21 LAB — CBC
HCT: 24.5 % — ABNORMAL LOW (ref 39.0–52.0)
Hemoglobin: 8 g/dL — ABNORMAL LOW (ref 13.0–17.0)
MCH: 35.7 pg — ABNORMAL HIGH (ref 26.0–34.0)
MCHC: 32.7 g/dL (ref 30.0–36.0)
MCV: 109.4 fL — ABNORMAL HIGH (ref 80.0–100.0)
Platelets: 156 10*3/uL (ref 150–400)
RBC: 2.24 MIL/uL — ABNORMAL LOW (ref 4.22–5.81)
RDW: 16.2 % — ABNORMAL HIGH (ref 11.5–15.5)
WBC: 78.9 10*3/uL (ref 4.0–10.5)
nRBC: 0.6 % — ABNORMAL HIGH (ref 0.0–0.2)

## 2020-10-21 LAB — HEPARIN LEVEL (UNFRACTIONATED): Heparin Unfractionated: 0.57 IU/mL (ref 0.30–0.70)

## 2020-10-21 LAB — APTT: aPTT: 73 seconds — ABNORMAL HIGH (ref 24–36)

## 2020-10-21 LAB — MAGNESIUM: Magnesium: 2.5 mg/dL — ABNORMAL HIGH (ref 1.7–2.4)

## 2020-10-21 LAB — MRSA PCR SCREENING: MRSA by PCR: NEGATIVE

## 2020-10-21 MED ORDER — VANCOMYCIN HCL IN DEXTROSE 1-5 GM/200ML-% IV SOLN
1000.0000 mg | INTRAVENOUS | Status: DC
  Administered 2020-10-22 – 2020-10-24 (×3): 1000 mg via INTRAVENOUS
  Filled 2020-10-21 (×3): qty 200

## 2020-10-21 MED ORDER — VANCOMYCIN HCL 2000 MG/400ML IV SOLN
2000.0000 mg | Freq: Once | INTRAVENOUS | Status: AC
  Administered 2020-10-21: 2000 mg via INTRAVENOUS
  Filled 2020-10-21: qty 400

## 2020-10-21 MED ORDER — PANTOPRAZOLE SODIUM 40 MG PO PACK
40.0000 mg | PACK | Freq: Every day | ORAL | Status: DC
  Administered 2020-10-21 – 2020-10-22 (×2): 40 mg
  Filled 2020-10-21 (×2): qty 20

## 2020-10-21 MED ORDER — POTASSIUM PHOSPHATES 15 MMOLE/5ML IV SOLN
20.0000 mmol | Freq: Once | INTRAVENOUS | Status: AC
  Administered 2020-10-21: 20 mmol via INTRAVENOUS
  Filled 2020-10-21: qty 6.67

## 2020-10-21 MED ORDER — PRISMASOL BGK 0/2.5 32-2.5 MEQ/L EC SOLN
Status: DC
  Filled 2020-10-21 (×4): qty 5000

## 2020-10-21 MED ORDER — PRISMASOL BGK 0/2.5 32-2.5 MEQ/L EC SOLN
Status: DC
  Filled 2020-10-21 (×25): qty 5000

## 2020-10-21 MED ORDER — PRISMASOL BGK 0/2.5 32-2.5 MEQ/L EC SOLN
Status: DC
  Filled 2020-10-21 (×7): qty 5000

## 2020-10-21 NOTE — Progress Notes (Signed)
-  Chart reviewed.  Patient not seen.  Discussed with Dr. Vaughan Browner as well as Dr. Waynette Buttery.  According to Dr. Ralene Bathe, patient remains in shock.  He remains in altered mental status.  He may need intubation.  Significantly elevated white count noted.  No fever.  CT scan report reviewed.  It showed ongoing pancreatitis with severe inflammatory changes around the pancreas.  Also showed large gallstone.  CT scan also showed around 4.6 cm low-density at the right iliacus  concerning for developing abscess.   Assessment  -----------------  -Abnormal CT scan showing large amount of inflammatory changes around the pancreas concerning for severe acute pancreatitis.  CT scan was without contrast.  Patient denied any abdominal pain.  Normal lipase .  Normal triglycerides.  Etiology of pancreatitis undetermined. ??  Ultrasound pancreatitis versus autoimmune pancreatitis.   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  Last CT scan in October 2021 at [facility] showed pancreatic calcification.    -Significant leukocytosis. ??  Hematological abnormality. -Atrial fibrillation  with hypotension.   -Severe acute kidney injury : Improving on dialysis -Acute respiratory failure with aspiration pneumonia along with COVID-19 infection -CHF  -Hypocalcemia.  Slowly improving  -Diarrhea alternating with constipation   Recommendations  --------------------------  -Discussed with Dr. Ralene Bathe.  Patient remains critically ill.  He may need intubation. -Recommend hematology consult for evaluation of severe leukocytosis.  I do not think his severe leukocytosis is just only from his pancreatitis.  He is currently on broad-spectrum antibiotics including meropenem.  -Recommend MRI MRCP with contrast once other  acute issues are resolved. -Prognosis poor. -Called and discussed with patient's wife over the phone.  Otis Brace MD, [place] 10/21/2020, 1:40 PM  Contact #  [phone]

## 2020-10-21 NOTE — Progress Notes (Signed)
ANTICOAGULATION CONSULT NOTE  Pharmacy Consult for Heparin Indication: atrial fibrillation  Patient Measurements: Height: 5\' 8"  (172.7 cm) Weight: 103.4 kg (227 lb 15.3 oz) IBW/kg (Calculated) : 68.4 Heparin Dosing Weight: 88.5 kg  Vital Signs: Temp: 97.1 F (36.2 C) (01/08 0334) Temp Source: Axillary (01/08 0334) BP: 76/62 (01/08 0715) Pulse Rate: 124 (01/08 0715)  Labs: Recent Labs    10/19/20 0358 10/19/20 0359 10/20/20 0308 10/20/20 0715 10/20/20 1643 10/21/20 0327 10/21/20 0328  HGB 8.2*  --   --  8.4*  --   --  8.0*  HCT 22.9*  --   --  25.1*  --   --  24.5*  PLT 168  --   --  146*  --   --  156  APTT 155*   < > 69*  --  37*  --  73*  HEPARINUNFRC  --    < > 0.69  --  0.40 0.57  --   CREATININE 2.60*   < >  --  1.96* 1.89* 1.77*  --    < > = values in this interval not displayed.    Estimated Creatinine Clearance: 34.3 mL/min (A) (by C-G formula based on SCr of 1.77 mg/dL (H)).  Assessment: 87 yr old man was admitted for COVID and AKI. Patient was taking apixaban PTA for aflutter (last dose was at 1030 on 10/15/20). Pharmacy has been asked to dose heparin at this time due to concerns for taking oral meds. Due to recent apixaban exposure, will monitor anticoagulation using aPTT until heparin levels and aPTT correlate.  aPTT on heparin infusion at 1000 units/hr is 73 sec, which is within the goal range for this pt; heparin level drawn at the same time was 0.57 units/ml, indicating likely continued effect of apixaban on heparin level.  Goal of Therapy:  APTT:  66-102 seconds Heparin level:  0.3-0.7 units/ml Monitor platelets by anticoagulation protocol: Yes   Plan:  Continue heparin infusion at 1000 units/hr Monitor daily aPTT, heparin level, CBC Monitor for signs/symptoms of bleeding  Alanda Slim, PharmD, [facility] Clinical Pharmacist Please see AMION for all Pharmacists' Contact Phone Numbers 10/21/2020, 7:51 AM

## 2020-10-21 NOTE — Progress Notes (Signed)
Pharmacy Antibiotic Note  Raymond Hurley. is a 87 y.o. male admitted on 10/14/2020 with sepsis.  Pharmacy has been consulted for Vancomycin dosing. On CRRT  Plan: Vanc 2000 mg IV x 1, then 1000 mf IV q24hr while on CRRT Monitor dialysis, MRSA PCR and vanc levels if needed  Height: 5\' 8"  (172.7 cm) Weight: 103.4 kg (227 lb 15.3 oz) IBW/kg (Calculated) : 68.4  Temp (24hrs), Avg:97.6 F (36.4 C), Min:97.1 F (36.2 C), Max:98.1 F (36.7 C)  Recent Labs  Lab 10/17/20 0443 10/17/20 1619 10/18/20 0352 10/18/20 1456 10/19/20 0358 10/19/20 1701 10/20/20 0715 10/20/20 1643 10/21/20 0327 10/21/20 0328  WBC 46.4*  --  43.8*  --  46.0*  --  58.0*  --   --  78.9*  CREATININE 8.62*   < > 5.05*   < > 2.60* 2.32* 1.96* 1.89* 1.77*  --    < > = values in this interval not displayed.    Estimated Creatinine Clearance: 34.3 mL/min (A) (by C-G formula based on SCr of 1.77 mg/dL (H)).    Allergies  Allergen Reactions  . Povidone-Iodine Swelling  . Tape Swelling  . Bee Venom Rash  . Covid-19 Mrna Vacc (Moderna) Hives and Rash    Rash came after booster shot. Pt was fine with 1st and 2nd    Antimicrobials this admission: Vanc 1/8 >>   Thank you for allowing pharmacy to be a part of this patient's care.  Alanda Slim, PharmD, [facility] Clinical Pharmacist Please see AMION for all Pharmacists' Contact Phone Numbers 10/21/2020, 7:47 AM

## 2020-10-21 NOTE — Progress Notes (Signed)
Called down to pharmacy to get a status on when they will be sending the new prismasol bags since new orders were placed at [place].   Pharmacist said they are working on them and will get them up as soon as they can.

## 2020-10-21 NOTE — Progress Notes (Signed)
Progress Note  Patient Name: Aisea Bouldin. Date of Encounter: 10/21/2020  Primary Cardiologist: Kirk Ruths, MD   Subjective   Remains critically ill on pressors, hypotensive, atrial fib with a controlled VR.  Inpatient Medications    Scheduled Meds: . B-complex with vitamin C  1 tablet Per Tube Daily  . Chlorhexidine Gluconate Cloth  6 each Topical Daily  . feeding supplement (PROSource TF)  45 mL Per Tube BID  . Gerhardt's butt cream   Topical TID  . insulin aspart  3-9 Units Subcutaneous Q4H  . mouth rinse  15 mL Mouth Rinse BID  . pantoprazole sodium  40 mg Per Tube Daily  . predniSONE  40 mg Per Tube Q breakfast   Followed by  . [START ON 10/23/2020] predniSONE  30 mg Per Tube Q breakfast   Followed by  . [START ON 10/26/2020] predniSONE  20 mg Per Tube Q breakfast   Followed by  . [START ON 10/29/2020] predniSONE  10 mg Per Tube Q breakfast  . sodium chloride flush  10-40 mL Intracatheter Q12H  . sodium chloride flush  3 mL Intravenous Q12H  . zinc sulfate  220 mg Per Tube Daily   Continuous Infusions: .  prismasol BGK 4/2.5 500 mL/hr at 10/21/20 0905  .  prismasol BGK 4/2.5 200 mL/hr at 10/20/20 2227  . sodium chloride Stopped (10/21/20 0909)  . amiodarone 30 mg/hr (10/21/20 1100)  . dexmedetomidine (PRECEDEX) IV infusion 0.4 mcg/kg/hr (10/21/20 1100)  . feeding supplement (VITAL AF 1.2 CAL) 1,000 mL (10/20/20 2156)  . heparin 1,000 Units/hr (10/21/20 1100)  . meropenem (MERREM) IV Stopped (10/21/20 0836)  . norepinephrine (LEVOPHED) Adult infusion 15 mcg/min (10/21/20 1129)  . potassium PHOSPHATE IVPB (in mmol) 20 mmol (10/21/20 1137)  . prismasol BGK 4/2.5 2,000 mL/hr at 10/21/20 1014  . [START ON 10/22/2020] vancomycin    . vasopressin Stopped (10/20/20 1350)   PRN Meds: sodium chloride, acetaminophen (TYLENOL) oral liquid 160 mg/5 mL, albuterol, chlorpheniramine-HYDROcodone, fentaNYL (SUBLIMAZE) injection, guaiFENesin-dextromethorphan, heparin,  heparin, melatonin, oxyCODONE, Resource ThickenUp Clear, sodium chloride flush, triamcinolone   Vital Signs    Vitals:   10/21/20 1045 10/21/20 1100 10/21/20 1115 10/21/20 1135  BP: (!) 88/50 (!) 82/58 (!) 81/53 100/69  Pulse: 98 (!) 104 97 91  Resp: (!) 21 (!) 24 (!) 22 (!) 25  Temp:      TempSrc:      SpO2: 96% 96% 96%   Weight:      Height:        Intake/Output Summary (Last 24 hours) at 10/21/2020 1159 Last data filed at 10/21/2020 1100 Gross per 24 hour  Intake 4711.9 ml  Output 6074 ml  Net -1362.1 ml   Filed Weights   10/19/20 0406 10/20/20 0500 10/21/20 0400  Weight: 105.6 kg 106.1 kg 103.4 kg    Telemetry    Atrial fib with NSVT - Personally Reviewed  ECG    none - Personally Reviewed  Physical Exam   GEN: acute on chronically ill appearing, no increased work of breathing.  Neck: 7 cm JVD Cardiac: IRIRR, no murmurs, rubs, or gallops.  Respiratory: scattered rales and rhonchi. GI: Soft, nontender, non-distended  MS: No edema; No deformity. Cool. Neuro:  Nonfocal  Psych: Normal affect   Labs    Chemistry Recent Labs  Lab 10/17/20 0443 10/17/20 1619 10/18/20 0352 10/18/20 1456 10/19/20 0358 10/19/20 1701 10/20/20 0715 10/20/20 1643 10/21/20 0327  NA 127*   < > 132*   < >  136 135 135 135 135  K 3.8   < > 3.7   < > 3.4* 3.6 4.3 4.4 4.3  CL 98   < > 97*   < > 97* 96* 97* 100 100  CO2 11*   < > 19*   < > 28 27 26 25 25   GLUCOSE 173*   < > 148*   < > 204* 187* 167* 163* 147*  BUN 108*   < > 63*   < > 31* 28* 23 27* 28*  CREATININE 8.62*   < > 5.05*   < > 2.60* 2.32* 1.96* 1.89* 1.77*  CALCIUM 5.4*   < > 6.8*   < > 6.6* 6.7* 7.2* 7.5* 7.5*  PROT 4.9*  --  5.2*  --  4.9*  --   --   --   --   ALBUMIN 2.3*   < > 2.9*   < > 2.5* 2.6*  --  2.5* 2.5*  AST 50*  --  51*  --  53*  --   --   --   --   ALT 40  --  42  --  46*  --   --   --   --   ALKPHOS 58  --  55  --  67  --   --   --   --   BILITOT 1.1  --  1.4*  --  0.9  --   --   --   --   GFRNONAA 5*    < > 10*   < > 23* 27* 32* 34* 37*  ANIONGAP 18*   < > 16*   < > 11 12 12 10 10    < > = values in this interval not displayed.     Hematology Recent Labs  Lab 10/19/20 0358 10/20/20 0715 10/21/20 0328  WBC 46.0* 58.0* 78.9*  RBC 2.27* 2.33* 2.24*  HGB 8.2* 8.4* 8.0*  HCT 22.9* 25.1* 24.5*  MCV 100.9* 107.7* 109.4*  MCH 36.1* 36.1* 35.7*  MCHC 35.8 33.5 32.7  RDW 15.1 15.9* 16.2*  PLT 168 146* 156    Cardiac EnzymesNo results for input(s): TROPONINI in the last 168 hours. No results for input(s): TROPIPOC in the last 168 hours.   BNP Recent Labs  Lab 10/17/20 0443 10/18/20 0352 10/19/20 0359  BNP 262.0* 520.1* 339.3*     DDimer  Recent Labs  Lab 10/17/20 0443 10/18/20 0352 10/19/20 0358  DDIMER 9.79* 7.26* 4.02*     Radiology    CT ABDOMEN PELVIS WO CONTRAST  Result Date: 10/20/2020 CLINICAL DATA:  Pancreatitis, respiratory failure. EXAM: CT CHEST, ABDOMEN AND PELVIS WITHOUT CONTRAST TECHNIQUE: Multidetector CT imaging of the chest, abdomen and pelvis was performed following the standard protocol without IV contrast. COMPARISON:  October 16, 2020. FINDINGS: CT CHEST FINDINGS Cardiovascular: No significant vascular findings. Normal heart size. No pericardial effusion. Mediastinum/Nodes: No enlarged mediastinal, hilar, or axillary lymph nodes. Thyroid gland, trachea, and esophagus demonstrate no significant findings. Lungs/Pleura: Mild to moderate bilateral pleural effusions are noted with adjacent subsegmental atelectasis of the lower lobes. No pneumothorax is noted. Patchy airspace opacities are noted in both upper lobes concerning for multifocal pneumonia. Musculoskeletal: No chest wall mass or suspicious bone lesions identified. CT ABDOMEN PELVIS FINDINGS Hepatobiliary: Large gallstone is noted. No biliary dilatation is noted. The liver is unremarkable. Pancreas: There remains is severe amount of inflammatory changes around the pancreas consistent with severe  pancreatitis. No definite defined fluid collection to  suggest pseudocyst is seen at this time. Spleen: Normal in size without focal abnormality. Adrenals/Urinary Tract: Adrenal glands appear normal. Left renal cyst is again noted and unchanged. No hydronephrosis or renal obstruction is noted. No renal or ureteral calculi are noted. Urinary bladder is unremarkable. Stomach/Bowel: The stomach appears normal. Feeding tube tip appears to be in proximal duodenum. There is no evidence of bowel obstruction or inflammation. Sigmoid diverticulosis is noted without inflammation. Vascular/Lymphatic: Aortic atherosclerosis. No enlarged abdominal or pelvic lymph nodes. Reproductive: Prostate is unremarkable. Other: 4.6 x 2.3 cm low density is noted within thickened right iliacus muscle concerning for developing abscess. Musculoskeletal: No acute or significant osseous findings. IMPRESSION: 1. Mild to moderate bilateral pleural effusions are noted with adjacent subsegmental atelectasis of the lower lobes. 2. Patchy airspace opacities are noted in both upper lobes concerning for multifocal pneumonia. 3. Large gallstone is noted. 4. There remains severe amount of inflammatory changes around the pancreas consistent with severe pancreatitis. No definite defined fluid collection to suggest pseudocyst is seen at this time. 5. Sigmoid diverticulosis without inflammation. 6. 4.6 x 2.3 cm low density is noted within thickened right iliacus muscle concerning for developing abscess. Aortic Atherosclerosis (ICD10-I70.0). Electronically Signed   By: Marijo Conception M.D.   On: 10/20/2020 13:55   CT CHEST WO CONTRAST  Result Date: 10/20/2020 CLINICAL DATA:  Pancreatitis, respiratory failure. EXAM: CT CHEST, ABDOMEN AND PELVIS WITHOUT CONTRAST TECHNIQUE: Multidetector CT imaging of the chest, abdomen and pelvis was performed following the standard protocol without IV contrast. COMPARISON:  October 16, 2020. FINDINGS: CT CHEST FINDINGS  Cardiovascular: No significant vascular findings. Normal heart size. No pericardial effusion. Mediastinum/Nodes: No enlarged mediastinal, hilar, or axillary lymph nodes. Thyroid gland, trachea, and esophagus demonstrate no significant findings. Lungs/Pleura: Mild to moderate bilateral pleural effusions are noted with adjacent subsegmental atelectasis of the lower lobes. No pneumothorax is noted. Patchy airspace opacities are noted in both upper lobes concerning for multifocal pneumonia. Musculoskeletal: No chest wall mass or suspicious bone lesions identified. CT ABDOMEN PELVIS FINDINGS Hepatobiliary: Large gallstone is noted. No biliary dilatation is noted. The liver is unremarkable. Pancreas: There remains is severe amount of inflammatory changes around the pancreas consistent with severe pancreatitis. No definite defined fluid collection to suggest pseudocyst is seen at this time. Spleen: Normal in size without focal abnormality. Adrenals/Urinary Tract: Adrenal glands appear normal. Left renal cyst is again noted and unchanged. No hydronephrosis or renal obstruction is noted. No renal or ureteral calculi are noted. Urinary bladder is unremarkable. Stomach/Bowel: The stomach appears normal. Feeding tube tip appears to be in proximal duodenum. There is no evidence of bowel obstruction or inflammation. Sigmoid diverticulosis is noted without inflammation. Vascular/Lymphatic: Aortic atherosclerosis. No enlarged abdominal or pelvic lymph nodes. Reproductive: Prostate is unremarkable. Other: 4.6 x 2.3 cm low density is noted within thickened right iliacus muscle concerning for developing abscess. Musculoskeletal: No acute or significant osseous findings. IMPRESSION: 1. Mild to moderate bilateral pleural effusions are noted with adjacent subsegmental atelectasis of the lower lobes. 2. Patchy airspace opacities are noted in both upper lobes concerning for multifocal pneumonia. 3. Large gallstone is noted. 4. There  remains severe amount of inflammatory changes around the pancreas consistent with severe pancreatitis. No definite defined fluid collection to suggest pseudocyst is seen at this time. 5. Sigmoid diverticulosis without inflammation. 6. 4.6 x 2.3 cm low density is noted within thickened right iliacus muscle concerning for developing abscess. Aortic Atherosclerosis (ICD10-I70.0). Electronically Signed   By: Sabino Dick  Jr M.D.   On: 10/20/2020 13:55    Cardiac Studies   Minimal troponin elevated due to supply/demand mismatch  Patient Profile     87 y.o. male admitted with resp failure, uncontrolled atrial fib in the setting of sepsis/shock  Assessment & Plan    1. Shock - suspect that this is sepsis. Remains hypotensive despite pressors and supportive care. Prognosis poor.  2. Atrial fib - his VR is controlled. BP is low. I would consider stopping the IV amiodarone. Could be contributing to hypotension. 3. Elevated troponin - no additional treatment as likely due to supply demand mismatch.  4. Acute on chronic renal failure - his creatinine is coming down and volume status much improved on CVVH. Watch volume status.      For questions or updates, please contact [place] Please consult [email or web address] for contact info under Cardiology/STEMI.      Signed, Cristopher Peru, MD  10/21/2020, 11:59 AM  Patient ID: [name]., male   DOB: 29-Mar-1931, 87 y.o.   MRN: [id number]

## 2020-10-21 NOTE — Progress Notes (Signed)
Seen and evaluated on HD. Persistent pressors. All 4k. 559ml/hr pre-filter, 256ml/hr post filter, DFR 2L/hr. CTM no changes. Remain even today but will need more UF with time

## 2020-10-21 NOTE — Progress Notes (Signed)
Lengthy discussion with patient and family regarding goals of care and code status.  All are in agreement that life-saving measures will not be performed in the event of a cardiopulmonary arrest including no endotracheal intubation, no CPR, no defibrillation.  CODE STATUS changed to DNR.

## 2020-10-21 NOTE — Progress Notes (Signed)
[facility] Progress Note    Assessment/ Plan:   1. Acute renal failure - baseline cr 1.2 in June 2021, now with severe AKI in the setting of multiple insults including COVID, diarrhea, and pancreatitis (also was taking entresto).  Severe AKI with Cr up to 8.6, falling UOP CV started 1/4.  1. Remains fluid overloaded but will maintain even given pressor requirements  2. Acute pancreatitis: Noted on CT scan 10/16/20 and again on 1/7. 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  Had a hospitalization at [facility] for pancreatitis 2016 when peripancreatic lymph node leading to dx first discovered. Mgmt per primary.   3. Hypocalcemia: multifactorial. Corrects to near normal when accounting for albumin. No calcium needed but can replete prn  4. Acute hypoxemic RF: likely aspiration PNA +/- COVID pneumonitis, abx per primary (on cefepime and falgyl currently). CT chest with ongoing pna on 1/7.  5. Shock: high WBC count, w/u per primary team. On antibiotics and pressors. Possible ilacus abscess on CT from 1/7  6. COVID-19 infection: vaccinated and boosted- treatment per primary  7. Acute encephalopathy: multifactorial, likely COVID + acute illness. CTM  8.  Chronic systolic CHF: EF 0/1/60 on TTE 60-65% with no WMA. Fluid removal as above  9.  Afib: hep and amiodarone gtts  10.  Dispo: critically ill and remains in the ICU    Subjective:    Continues on CV. No documented UOP. Confused but O2 status improved on 2LNC this morning. Remains on presors. 1.3L negative in 24 hours.     Objective:   BP 94/62   Pulse 83   Temp (!) 97.4 F (36.3 C) (Oral)   Resp 19   Ht 5\' 8"  (1.727 m)   Wt 103.4 kg   SpO2 96%   BMI 34.66 kg/m   Intake/Output Summary (Last 24 hours) at 10/21/2020 1012 Last data filed at 10/21/2020 0900 Gross per 24 hour  Intake 4739.51 ml  Output 5668 ml  Net -928.49 ml   Weight change:  -2.7 kg  Physical Exam:    Gen: older gentleman, lying in bed, not interactive CVS: normal rate, irregular Resp: tachypneic, mild iwob Abd: somewhat distended, nontender to palpation Ext: 2+ bilateral lower extremity edema NEURO: confused  Imaging: CT ABDOMEN PELVIS WO CONTRAST  Result Date: 10/20/2020 CLINICAL DATA:  Pancreatitis, respiratory failure. EXAM: CT CHEST, ABDOMEN AND PELVIS WITHOUT CONTRAST TECHNIQUE: Multidetector CT imaging of the chest, abdomen and pelvis was performed following the standard protocol without IV contrast. COMPARISON:  October 16, 2020. FINDINGS: CT CHEST FINDINGS Cardiovascular: No significant vascular findings. Normal heart size. No pericardial effusion. Mediastinum/Nodes: No enlarged mediastinal, hilar, or axillary lymph nodes. Thyroid gland, trachea, and esophagus demonstrate no significant findings. Lungs/Pleura: Mild to moderate bilateral pleural effusions are noted with adjacent subsegmental atelectasis of the lower lobes. No pneumothorax is noted. Patchy airspace opacities are noted in both upper lobes concerning for multifocal pneumonia. Musculoskeletal: No chest wall mass or suspicious bone lesions identified. CT ABDOMEN PELVIS FINDINGS Hepatobiliary: Large gallstone is noted. No biliary dilatation is noted. The liver is unremarkable. Pancreas: There remains is severe amount of inflammatory changes around the pancreas consistent with severe pancreatitis. No definite defined fluid collection to suggest pseudocyst is seen at this time. Spleen: Normal in size without focal abnormality. Adrenals/Urinary Tract: Adrenal glands appear normal. Left renal cyst is again noted and unchanged. No hydronephrosis or renal obstruction is noted. No renal or  ureteral calculi are noted. Urinary bladder is unremarkable. Stomach/Bowel: The stomach appears normal. Feeding tube tip appears to be in proximal duodenum. There is no evidence of bowel obstruction or inflammation. Sigmoid  diverticulosis is noted without inflammation. Vascular/Lymphatic: Aortic atherosclerosis. No enlarged abdominal or pelvic lymph nodes. Reproductive: Prostate is unremarkable. Other: 4.6 x 2.3 cm low density is noted within thickened right iliacus muscle concerning for developing abscess. Musculoskeletal: No acute or significant osseous findings. IMPRESSION: 1. Mild to moderate bilateral pleural effusions are noted with adjacent subsegmental atelectasis of the lower lobes. 2. Patchy airspace opacities are noted in both upper lobes concerning for multifocal pneumonia. 3. Large gallstone is noted. 4. There remains severe amount of inflammatory changes around the pancreas consistent with severe pancreatitis. No definite defined fluid collection to suggest pseudocyst is seen at this time. 5. Sigmoid diverticulosis without inflammation. 6. 4.6 x 2.3 cm low density is noted within thickened right iliacus muscle concerning for developing abscess. Aortic Atherosclerosis (ICD10-I70.0). Electronically Signed   By: Marijo Conception M.D.   On: 10/20/2020 13:55   CT CHEST WO CONTRAST  Result Date: 10/20/2020 CLINICAL DATA:  Pancreatitis, respiratory failure. EXAM: CT CHEST, ABDOMEN AND PELVIS WITHOUT CONTRAST TECHNIQUE: Multidetector CT imaging of the chest, abdomen and pelvis was performed following the standard protocol without IV contrast. COMPARISON:  October 16, 2020. FINDINGS: CT CHEST FINDINGS Cardiovascular: No significant vascular findings. Normal heart size. No pericardial effusion. Mediastinum/Nodes: No enlarged mediastinal, hilar, or axillary lymph nodes. Thyroid gland, trachea, and esophagus demonstrate no significant findings. Lungs/Pleura: Mild to moderate bilateral pleural effusions are noted with adjacent subsegmental atelectasis of the lower lobes. No pneumothorax is noted. Patchy airspace opacities are noted in both upper lobes concerning for multifocal pneumonia. Musculoskeletal: No chest wall mass or  suspicious bone lesions identified. CT ABDOMEN PELVIS FINDINGS Hepatobiliary: Large gallstone is noted. No biliary dilatation is noted. The liver is unremarkable. Pancreas: There remains is severe amount of inflammatory changes around the pancreas consistent with severe pancreatitis. No definite defined fluid collection to suggest pseudocyst is seen at this time. Spleen: Normal in size without focal abnormality. Adrenals/Urinary Tract: Adrenal glands appear normal. Left renal cyst is again noted and unchanged. No hydronephrosis or renal obstruction is noted. No renal or ureteral calculi are noted. Urinary bladder is unremarkable. Stomach/Bowel: The stomach appears normal. Feeding tube tip appears to be in proximal duodenum. There is no evidence of bowel obstruction or inflammation. Sigmoid diverticulosis is noted without inflammation. Vascular/Lymphatic: Aortic atherosclerosis. No enlarged abdominal or pelvic lymph nodes. Reproductive: Prostate is unremarkable. Other: 4.6 x 2.3 cm low density is noted within thickened right iliacus muscle concerning for developing abscess. Musculoskeletal: No acute or significant osseous findings. IMPRESSION: 1. Mild to moderate bilateral pleural effusions are noted with adjacent subsegmental atelectasis of the lower lobes. 2. Patchy airspace opacities are noted in both upper lobes concerning for multifocal pneumonia. 3. Large gallstone is noted. 4. There remains severe amount of inflammatory changes around the pancreas consistent with severe pancreatitis. No definite defined fluid collection to suggest pseudocyst is seen at this time. 5. Sigmoid diverticulosis without inflammation. 6. 4.6 x 2.3 cm low density is noted within thickened right iliacus muscle concerning for developing abscess. Aortic Atherosclerosis (ICD10-I70.0). Electronically Signed   By: Marijo Conception M.D.   On: 10/20/2020 13:55    Labs: BMET Recent Labs  Lab 10/18/20 0352 10/18/20 1116 10/18/20 1456  10/18/20 1909 10/19/20 0358 10/19/20 1701 10/20/20 0715 10/20/20 1643 10/21/20 0327  NA 132*  --  134*  --  136 135 135 135 135  K 3.7  --  3.6  --  3.4* 3.6 4.3 4.4 4.3  CL 97*  --  96*  --  97* 96* 97* 100 100  CO2 19*  --  23  --  28 27 26 25 25   GLUCOSE 148*  --  190*  --  204* 187* 167* 163* 147*  BUN 63*  --  44*  --  31* 28* 23 27* 28*  CREATININE 5.05*  --  3.91*  --  2.60* 2.32* 1.96* 1.89* 1.77*  CALCIUM 6.8*  --  7.3*  --  6.6* 6.7* 7.2* 7.5* 7.5*  PHOS 5.4*   < > 4.2 4.1 2.8 2.9  2.9 2.6 2.4* 2.2*   < > = values in this interval not displayed.   CBC Recent Labs  Lab 10/16/20 0949 10/17/20 0443 10/18/20 0352 10/19/20 0358 10/20/20 0715 10/21/20 0328  WBC 50.7* 46.4* 43.8* 46.0* 58.0* 78.9*  NEUTROABS 39.1* 34.6* 29.1* 38.6*  --   --   HGB 10.9* 9.2* 8.6* 8.2* 8.4* 8.0*  HCT 31.9* 26.4* 24.4* 22.9* 25.1* 24.5*  MCV 105.3* 104.3* 101.7* 100.9* 107.7* 109.4*  PLT 170 151 162 168 146* 156    Medications:    . B-complex with vitamin C  1 tablet Per Tube Daily  . Chlorhexidine Gluconate Cloth  6 each Topical Daily  . feeding supplement (PROSource TF)  45 mL Per Tube BID  . Gerhardt's butt cream   Topical TID  . insulin aspart  3-9 Units Subcutaneous Q4H  . mouth rinse  15 mL Mouth Rinse BID  . pantoprazole  40 mg Oral Daily  . predniSONE  40 mg Per Tube Q breakfast   Followed by  . [START ON 10/23/2020] predniSONE  30 mg Per Tube Q breakfast   Followed by  . [START ON 10/26/2020] predniSONE  20 mg Per Tube Q breakfast   Followed by  . [START ON 10/29/2020] predniSONE  10 mg Per Tube Q breakfast  . sodium chloride flush  10-40 mL Intracatheter Q12H  . sodium chloride flush  3 mL Intravenous Q12H  . zinc sulfate  220 mg Per Tube Daily      Reesa Chew  10/21/2020, 10:12 AM

## 2020-10-21 NOTE — Progress Notes (Addendum)
NAME:  Raymond Collister., MRN:  [id number], DOB:  24-Jun-1931, LOS: 7 ADMISSION DATE:  10/14/2020, CONSULTATION DATE:  10/14/2020 REFERRING MD:  [place] [place] - [facility], CHIEF COMPLAINT:  Hypotension.    HPI/course in hospital   87 year old man with a history of melanoma with metastatic disease to pancreas is admitted for severe acute pancreatitis. He presented with hypotension, diarrhea, hypoxemia. Found to be Covid positive. CT of the abdomen showed pancreatitis.  Past Medical History  Atrial fibrillation, melanoma, vitamin B12 deficiency.  Systolic cardiomyopathy. Metastatic melanoma with mets to pancreas (followed at [facility])    Interim history/subjective:  Overnight his mental status and shock is worsening. Opens eyes to painful stimuli. Minimally arousable. Does answer questions.  No pain.  Short of breath.  Cough.  Objective   Blood pressure 136/79, pulse (!) 120, temperature (!) 97.4 F (36.3 C), temperature source Axillary, resp. rate (!) 27, height 5\' 8"  (1.727 m), weight 103.4 kg, SpO2 96 %.        Intake/Output Summary (Last 24 hours) at 10/21/2020 1406 Last data filed at 10/21/2020 1300 Gross per 24 hour  Intake 4139.66 ml  Output 5374 ml  Net -1234.34 ml   Filed Weights   10/19/20 0406 10/20/20 0500 10/21/20 0400  Weight: 105.6 kg 106.1 kg 103.4 kg    Examination:  Gen:   Moderate distress, tachypnea, tachycardia  HEENT: No scleral icterus Neck:    No masses; no thyromegaly Lungs:  Respiratory distress, CV:       Irregular and rhythm; no murmurs Abd:    Abdomen distended but soft. Ext:  No edema; adequate peripheral perfusion Skin:     Warm and dry; no rash Neuro:  Somnolent, minimally arousable, answering questions.  Oriented to place and time.  labs/imaging reviewed Chest x-ray 10/17/2020: Bilateral opacities, bilateral pleural effusions. Independently viewed and interpreted by me  Assessment & Plan:  1. Shock 2. Possible iliopsoas abscess  3.severe Acute  pancreatitis  4. Acute encephalopathy  5. Acute hypoxemic respiratory failure  6. Covid-19 pneumonia  7. Acute kidney injury requiring Continuous renal replacement therapy  8. Hyperleukocytosis 9. Atrial fibrillation with rapid ventricular response 10. History of metastatic melanoma with mets to pancreas, s/p 30 infusions of Pembrolizumab in July 2018.Marland Kitchen   Patient is admitted to the intensive care unit for shock.  He is in multiorgan system failure with encephalopathy, acute kidney,  and respiratory failure Cause of shock use likely multifactorial due to severe acute pancreatitis covid, and possible right psoas abscess.  There is a low attenuation area on CT scan seen around the iliopsoas.  This may be an abscess especially given elevation in white count of 80,000.  ICU team previously spoke with IR regarding sampling.  He would need to be intubated for the procedure.  In addition his shock is worsening and he is headed towards intubated for his mental status and for demand respiratory failure.   I spoke with family regarding intubation and CPR.  Family and patient do not want any life-saving measures including intubation and CPR.  CODE STATUS changed to DNR.  They are not ready to proceed with comfort measures at this time. We will continue supportive care pancreatitis, and antibiotics for possible psoas abscess. vanco and meropenem. Continue with CVVHD for acute kidney injury.   Ordering ultrasound to evaluate for gallstone pancreatitis. If leukocytosis worsens may need to consult hematology. Continue steroid taper for covid  Amiodarone and heparin for atrial fibrillation Continue Levophed, goal MAP >  31mmhg    Best practice (evaluated daily)  Diet: Tube feeds Pain/Anxiety/Delirium protocol (if indicated): NA VAP protocol (if indicated): NA DVT prophylaxis: heparin GI prophylaxis: PPI  Glucose control: SSI  Mobility: BR Disposition:ICU  Goals of Care:  Last date of multidisciplinary  goals of care discussion: Spoke with wife and son on 1/8 Family and staff present: yes Summary of discussion: CODE STATUS changed to DNR   The patient is critically ill with multiple organ system failure and requires high complexity decision making for assessment and support, frequent evaluation and titration of therapies, advanced monitoring, review of radiographic studies and interpretation of complex data.   Critical Care Time devoted to patient care services, exclusive of separately billable procedures, described in this note is 35 minutes.

## 2020-10-22 ENCOUNTER — Inpatient Hospital Stay (HOSPITAL_COMMUNITY): Payer: Medicare Other

## 2020-10-22 LAB — CBC
HCT: 24.3 % — ABNORMAL LOW (ref 39.0–52.0)
Hemoglobin: 7.9 g/dL — ABNORMAL LOW (ref 13.0–17.0)
MCH: 35 pg — ABNORMAL HIGH (ref 26.0–34.0)
MCHC: 32.5 g/dL (ref 30.0–36.0)
MCV: 107.5 fL — ABNORMAL HIGH (ref 80.0–100.0)
Platelets: 172 10*3/uL (ref 150–400)
RBC: 2.26 MIL/uL — ABNORMAL LOW (ref 4.22–5.81)
RDW: 16 % — ABNORMAL HIGH (ref 11.5–15.5)
WBC: 85.4 10*3/uL (ref 4.0–10.5)
nRBC: 0.8 % — ABNORMAL HIGH (ref 0.0–0.2)

## 2020-10-22 LAB — RENAL FUNCTION PANEL
Albumin: 2.5 g/dL — ABNORMAL LOW (ref 3.5–5.0)
Albumin: 2.5 g/dL — ABNORMAL LOW (ref 3.5–5.0)
Anion gap: 11 (ref 5–15)
Anion gap: 9 (ref 5–15)
BUN: 31 mg/dL — ABNORMAL HIGH (ref 8–23)
BUN: 29 mg/dL — ABNORMAL HIGH (ref 8–23)
CO2: 24 mmol/L (ref 22–32)
CO2: 26 mmol/L (ref 22–32)
Calcium: 7.6 mg/dL — ABNORMAL LOW (ref 8.9–10.3)
Calcium: 7.7 mg/dL — ABNORMAL LOW (ref 8.9–10.3)
Chloride: 98 mmol/L (ref 98–111)
Chloride: 99 mmol/L (ref 98–111)
Creatinine, Ser: 1.58 mg/dL — ABNORMAL HIGH (ref 0.61–1.24)
Creatinine, Ser: 1.65 mg/dL — ABNORMAL HIGH (ref 0.61–1.24)
GFR, Estimated: 42 mL/min — ABNORMAL LOW (ref >60)
GFR, Estimated: 40 mL/min — ABNORMAL LOW (ref >60)
Glucose, Bld: 146 mg/dL — ABNORMAL HIGH (ref 70–99)
Glucose, Bld: 163 mg/dL — ABNORMAL HIGH (ref 70–99)
Phosphorus: 2.9 mg/dL (ref 2.5–4.6)
Phosphorus: 3.3 mg/dL (ref 2.5–4.6)
Potassium: 4.5 mmol/L (ref 3.5–5.1)
Potassium: 4.2 mmol/L (ref 3.5–5.1)
Sodium: 133 mmol/L — ABNORMAL LOW (ref 135–145)
Sodium: 134 mmol/L — ABNORMAL LOW (ref 135–145)

## 2020-10-22 LAB — MAGNESIUM: Magnesium: 2.4 mg/dL (ref 1.7–2.4)

## 2020-10-22 LAB — APTT
aPTT: 126 s — ABNORMAL HIGH (ref 24–36)
aPTT: 124 seconds — ABNORMAL HIGH (ref 24–36)

## 2020-10-22 LAB — GLUCOSE, CAPILLARY
Glucose-Capillary: 146 mg/dL — ABNORMAL HIGH (ref 70–99)
Glucose-Capillary: 154 mg/dL — ABNORMAL HIGH (ref 70–99)
Glucose-Capillary: 172 mg/dL — ABNORMAL HIGH (ref 70–99)
Glucose-Capillary: 143 mg/dL — ABNORMAL HIGH (ref 70–99)
Glucose-Capillary: 134 mg/dL — ABNORMAL HIGH (ref 70–99)
Glucose-Capillary: 102 mg/dL — ABNORMAL HIGH (ref 70–99)

## 2020-10-22 LAB — HEPARIN LEVEL (UNFRACTIONATED)
Heparin Unfractionated: 0.83 IU/mL — ABNORMAL HIGH (ref 0.30–0.70)
Heparin Unfractionated: 0.82 IU/mL — ABNORMAL HIGH (ref 0.30–0.70)

## 2020-10-22 MED ORDER — AMIODARONE HCL 200 MG PO TABS: 200.0000 mg | ORAL_TABLET | Freq: Every day | ORAL | Status: DC

## 2020-10-22 MED ORDER — HEPARIN (PORCINE) 25000 UT/250ML-% IV SOLN
700.0000 [IU]/h | INTRAVENOUS | Status: DC
  Administered 2020-10-23: 600 [IU]/h via INTRAVENOUS
  Administered 2020-10-24: 700 [IU]/h via INTRAVENOUS
  Filled 2020-10-22 (×3): qty 250

## 2020-10-22 MED ORDER — AMIODARONE LOAD VIA INFUSION
150.0000 mg | Freq: Once | INTRAVENOUS | Status: AC
  Administered 2020-10-22: 150 mg via INTRAVENOUS
  Filled 2020-10-22: qty 83.34

## 2020-10-22 MED ORDER — AMIODARONE HCL 200 MG PO TABS: 400.0000 mg | ORAL_TABLET | Freq: Two times a day (BID) | ORAL | Status: DC

## 2020-10-22 MED ORDER — METHYLPREDNISOLONE SODIUM SUCC 40 MG IJ SOLR
40.0000 mg | Freq: Once | INTRAMUSCULAR | Status: AC
  Administered 2020-10-22: 40 mg via INTRAVENOUS
  Filled 2020-10-22: qty 1

## 2020-10-22 MED ORDER — METHYLPREDNISOLONE SODIUM SUCC 40 MG IJ SOLR: 10.0000 mg | INTRAMUSCULAR | Status: DC

## 2020-10-22 MED ORDER — ACETAMINOPHEN 160 MG/5ML PO SOLN
650.0000 mg | Freq: Four times a day (QID) | ORAL | Status: DC
  Administered 2020-10-22 – 2020-10-24 (×8): 650 mg
  Filled 2020-10-22 (×8): qty 20.3

## 2020-10-22 MED ORDER — AMIODARONE HCL 200 MG PO TABS
400.0000 mg | ORAL_TABLET | Freq: Two times a day (BID) | ORAL | Status: DC
  Administered 2020-10-22: 400 mg
  Filled 2020-10-22: qty 2

## 2020-10-22 MED ORDER — AMIODARONE HCL IN DEXTROSE 360-4.14 MG/200ML-% IV SOLN
30.0000 mg/h | INTRAVENOUS | Status: DC
  Administered 2020-10-22 – 2020-10-25 (×5): 30 mg/h via INTRAVENOUS
  Filled 2020-10-22 (×6): qty 200

## 2020-10-22 MED ORDER — AMIODARONE IV BOLUS ONLY 150 MG/100ML: 150.0000 mg | Freq: Once | INTRAVENOUS | Status: DC

## 2020-10-22 MED ORDER — METHYLPREDNISOLONE SODIUM SUCC 40 MG IJ SOLR: 20.0000 mg | INTRAMUSCULAR | Status: DC

## 2020-10-22 MED ORDER — METHYLPREDNISOLONE SODIUM SUCC 40 MG IJ SOLR: 30.0000 mg | INTRAMUSCULAR | Status: DC

## 2020-10-22 NOTE — Progress Notes (Signed)
NAME:  Raymond Hurley., MRN:  [id number], DOB:  03-22-1931, LOS: 8 ADMISSION DATE:  10/14/2020, CONSULTATION DATE:  10/14/2020 REFERRING MD:  [facility] - [facility], CHIEF COMPLAINT:  Hypotension.    HPI/course in hospital   87 year old man with a history of melanoma with metastatic disease to pancreas is admitted for severe acute pancreatitis. He presented with hypotension, diarrhea, hypoxemia. Found to be Covid positive. CT of the abdomen showed pancreatitis.  Past Medical History  Atrial fibrillation, melanoma, vitamin B12 deficiency.  Systolic cardiomyopathy. Metastatic melanoma with mets to pancreas (followed at [facility])    Interim history/subjective:  More awake and alert this morning.  Blood pressure improved.  Abdomen more distended today.  Has no abdominal pain or fever.  No diarrhea.  Objective   Blood pressure 101/63, pulse (!) 128, temperature 97.7 F (36.5 C), temperature source Axillary, resp. rate (!) 26, height 5\' 8"  (1.727 m), weight 100.1 kg, SpO2 94 %.        Intake/Output Summary (Last 24 hours) at 10/22/2020 1339 Last data filed at 10/22/2020 1200 Gross per 24 hour  Intake 3976.99 ml  Output 4949 ml  Net -972.01 ml   Filed Weights   10/20/20 0500 10/21/20 0400 10/22/20 0500  Weight: 106.1 kg 103.4 kg 100.1 kg    Examination:  Gen:   Mild distress.  Voice is soft.  Speaking in short sentences. HEENT: No scleral icterus Neck:    No masses; no thyromegaly Lungs:  Respiratory distress, CV:       Irregular and rhythm; no murmurs Abd:    Abdomen distended but soft. Ext:  No edema; adequate peripheral perfusion Skin:     Warm and dry; no rash Neuro:  Somnolent, minimally arousable, answering questions.  Oriented to place and time.  labs/imaging reviewed Chest x-ray 10/17/2020: Bilateral opacities, bilateral pleural effusions. Independently viewed and interpreted by me  Assessment & Plan:  1. Shock 2. possible iliopsoas abscess  3. severe Acute pancreatitis  4.  Acute encephalopathy  5. Acute hypoxemic respiratory failure  6. Covid-19 pneumonia  7. Acute kidney injury requiring Continuous renal replacement therapy  8. Hyperleukocytosis 9. Atrial fibrillation with rapid ventricular response 10. History of metastatic melanoma with mets to pancreas, s/p 30 infusions of Pembrolizumab in July 2018.Marland Kitchen   His shock is is improving.  He is a little bit more awake today. hemodynamics are improved.  Lactate is normal.  Cap refill is normal, and his vasopressors are being weaned down.  Current on 8 mics of Levophed.  Goal MAP 59mhg.  Shock is likely multifactorial due to severe acute pancreatitis and possible right psoas abscess.  Cause of pancreatitis is not clear.  Work-up negative.  He has an ileus.  hold tube feeds, n.p.o. convert po meds  to IV. I suspect it is due to pancreatitis. c diff also possible given persistent leukocytosis and recent antibiotic use. He does not have diarrhea and He had a negative test on 1/1 so suspect C. difficile is less likely.  If abdomen becomes more distended or shock worsens will order CT abdomen pelvis to evaluate for colitis. He is on meropenem and vancomycin.  CT showing low-attenuation area near iliopsoas. I am treating for possible right psoas abscess. Blood cultures negative.  IR was consulted earlier in the week.  They are willing to perform percutaneous drainage however they recommended intubation prior to procedure.  I discussed with family and patient. He does not want to be intubated. In addition, his  shock is improving, so we hold off for now.  May just end up treating empirically for abscess But ideally would like to perform percutaneous drainage to see if it is abscess.  Can consider reimaging the area in the next few days to see if there is an increase in size than we discussed with IR. His respiratory status is improving.  He is currently on low flow nasal cannula.  Has bilateral effusions but they are small on bedside  lung ultrasound and not enough to tap.  I suspect this is transudate or exudate due to pancreatitis.  Less likely empyema but will continue to monitor.  His AKI is secondary to shock.  His urine output is minimal.  We are trying to pull -100 cc an hour as tolerated.  Acid-base electrolytes are acceptable.   flow cytometry pending for leukocytosis.  If continues to worsen consult hematology  Continue amiodarone and low dose heparin for atrial fibrillation Glucose at goal < 180mg /dl Family updated today. DNR  Best practice (evaluated daily)  Diet: N.p.o. for ileus Pain/Anxiety/Delirium protocol (if indicated): NA VAP protocol (if indicated): NA DVT prophylaxis: heparin GI prophylaxis: n/a Glucose control: SSI  Mobility: BR Disposition:ICU  Goals of Care:  Last date of multidisciplinary goals of care discussion: Spoke with wife and son on 1/8 Family and staff present: yes Summary of discussion: CODE STATUS changed to DNR   The patient is critically ill with multiple organ system failure and requires high complexity decision making for assessment and support, frequent evaluation and titration of therapies, advanced monitoring, review of radiographic studies and interpretation of complex data.   Critical Care Time devoted to patient care services, exclusive of separately billable procedures, described in this note is 35 minutes.

## 2020-10-22 NOTE — Progress Notes (Signed)
[facility] Progress Note    Assessment/ Plan:   1. Acute renal failure - baseline cr 1.2 in June 2021, now with severe AKI in the setting of multiple insults including COVID, diarrhea, and pancreatitis (also was taking entresto).  Severe AKI with Cr up to 8.6, falling UOP CV started 1/4.  1. Remains fluid overloaded, attempt to increase ultrafiltration 100 to 150 cc/h as tolerated 2. Maintain all CRRT fluid with 2K for today given recent hyperkalemia  2. Acute pancreatitis: Noted on CT scan 10/16/20 and again on 1/7. 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  Had a hospitalization at [facility] for pancreatitis 2016 when peripancreatic lymph node leading to dx first discovered. Mgmt per primary.   3. Hypocalcemia: multifactorial. Corrects to near normal when accounting for albumin. No calcium needed but can replete prn  4. Acute hypoxemic RF: likely aspiration PNA +/- COVID pneumonitis, abx per primary (on cefepime and falgyl currently). CT chest with ongoing pna on 1/7.  5. Shock: high WBC count, w/u per primary team. On antibiotics and pressors. Possible ilacus abscess on CT from 1/7  6. COVID-19 infection: vaccinated and boosted- treatment per primary  7. Acute encephalopathy: multifactorial, likely COVID + acute illness. CTM  8.  Chronic systolic CHF: EF 04/19/15 on TTE 60-65% with no WMA. Fluid removal as above  9.  Afib: hep and amiodarone gtts  10.  Dispo: critically ill and remains in the ICU    Subjective:   Due to the COVID pandemic, in attempts to limit transmission and over-utilization of resources (e.g. PPE), the patient was seen virtually today by means of chart review, discussion with staff, and virtual discussion with patient as needed.  Continues on CV. No documented UOP.  Essentially even yesterday.  Blood pressure overall improved but remains significantly tachycardic.  Minimal oxygen  requirement.  Remains on norepinephrine.  Potassium greater than 5 yesterday evening switch to 2K bath now improved.   Objective:   BP 122/64   Pulse (!) 131   Temp 97.8 F (36.6 C) (Axillary)   Resp (!) 23   Ht 5\' 8"  (1.727 m)   Wt 100.1 kg   SpO2 97%   BMI 33.55 kg/m   Intake/Output Summary (Last 24 hours) at 10/22/2020 0716 Last data filed at 10/22/2020 0700 Gross per 24 hour  Intake 4611.31 ml  Output 4904 ml  Net -292.69 ml   Weight change: -3.3 kg  Physical Exam:    Not examined today  Imaging: CT ABDOMEN PELVIS WO CONTRAST  Result Date: 10/20/2020 CLINICAL DATA:  Pancreatitis, respiratory failure. EXAM: CT CHEST, ABDOMEN AND PELVIS WITHOUT CONTRAST TECHNIQUE: Multidetector CT imaging of the chest, abdomen and pelvis was performed following the standard protocol without IV contrast. COMPARISON:  October 16, 2020. FINDINGS: CT CHEST FINDINGS Cardiovascular: No significant vascular findings. Normal heart size. No pericardial effusion. Mediastinum/Nodes: No enlarged mediastinal, hilar, or axillary lymph nodes. Thyroid gland, trachea, and esophagus demonstrate no significant findings. Lungs/Pleura: Mild to moderate bilateral pleural effusions are noted with adjacent subsegmental atelectasis of the lower lobes. No pneumothorax is noted. Patchy airspace opacities are noted in both upper lobes concerning for multifocal pneumonia. Musculoskeletal: No chest wall mass or suspicious bone lesions identified. CT ABDOMEN PELVIS FINDINGS Hepatobiliary: Large gallstone is noted. No biliary dilatation is noted. The liver is unremarkable. Pancreas: There remains is severe amount of inflammatory changes around the pancreas consistent with severe pancreatitis. No definite defined  fluid collection to suggest pseudocyst is seen at this time. Spleen: Normal in size without focal abnormality. Adrenals/Urinary Tract: Adrenal glands appear normal. Left renal cyst is again noted and unchanged. No hydronephrosis or  renal obstruction is noted. No renal or ureteral calculi are noted. Urinary bladder is unremarkable. Stomach/Bowel: The stomach appears normal. Feeding tube tip appears to be in proximal duodenum. There is no evidence of bowel obstruction or inflammation. Sigmoid diverticulosis is noted without inflammation. Vascular/Lymphatic: Aortic atherosclerosis. No enlarged abdominal or pelvic lymph nodes. Reproductive: Prostate is unremarkable. Other: 4.6 x 2.3 cm low density is noted within thickened right iliacus muscle concerning for developing abscess. Musculoskeletal: No acute or significant osseous findings. IMPRESSION: 1. Mild to moderate bilateral pleural effusions are noted with adjacent subsegmental atelectasis of the lower lobes. 2. Patchy airspace opacities are noted in both upper lobes concerning for multifocal pneumonia. 3. Large gallstone is noted. 4. There remains severe amount of inflammatory changes around the pancreas consistent with severe pancreatitis. No definite defined fluid collection to suggest pseudocyst is seen at this time. 5. Sigmoid diverticulosis without inflammation. 6. 4.6 x 2.3 cm low density is noted within thickened right iliacus muscle concerning for developing abscess. Aortic Atherosclerosis (ICD10-I70.0). Electronically Signed   By: Marijo Conception M.D.   On: 10/20/2020 13:55   CT CHEST WO CONTRAST  Result Date: 10/20/2020 CLINICAL DATA:  Pancreatitis, respiratory failure. EXAM: CT CHEST, ABDOMEN AND PELVIS WITHOUT CONTRAST TECHNIQUE: Multidetector CT imaging of the chest, abdomen and pelvis was performed following the standard protocol without IV contrast. COMPARISON:  October 16, 2020. FINDINGS: CT CHEST FINDINGS Cardiovascular: No significant vascular findings. Normal heart size. No pericardial effusion. Mediastinum/Nodes: No enlarged mediastinal, hilar, or axillary lymph nodes. Thyroid gland, trachea, and esophagus demonstrate no significant findings. Lungs/Pleura: Mild to  moderate bilateral pleural effusions are noted with adjacent subsegmental atelectasis of the lower lobes. No pneumothorax is noted. Patchy airspace opacities are noted in both upper lobes concerning for multifocal pneumonia. Musculoskeletal: No chest wall mass or suspicious bone lesions identified. CT ABDOMEN PELVIS FINDINGS Hepatobiliary: Large gallstone is noted. No biliary dilatation is noted. The liver is unremarkable. Pancreas: There remains is severe amount of inflammatory changes around the pancreas consistent with severe pancreatitis. No definite defined fluid collection to suggest pseudocyst is seen at this time. Spleen: Normal in size without focal abnormality. Adrenals/Urinary Tract: Adrenal glands appear normal. Left renal cyst is again noted and unchanged. No hydronephrosis or renal obstruction is noted. No renal or ureteral calculi are noted. Urinary bladder is unremarkable. Stomach/Bowel: The stomach appears normal. Feeding tube tip appears to be in proximal duodenum. There is no evidence of bowel obstruction or inflammation. Sigmoid diverticulosis is noted without inflammation. Vascular/Lymphatic: Aortic atherosclerosis. No enlarged abdominal or pelvic lymph nodes. Reproductive: Prostate is unremarkable. Other: 4.6 x 2.3 cm low density is noted within thickened right iliacus muscle concerning for developing abscess. Musculoskeletal: No acute or significant osseous findings. IMPRESSION: 1. Mild to moderate bilateral pleural effusions are noted with adjacent subsegmental atelectasis of the lower lobes. 2. Patchy airspace opacities are noted in both upper lobes concerning for multifocal pneumonia. 3. Large gallstone is noted. 4. There remains severe amount of inflammatory changes around the pancreas consistent with severe pancreatitis. No definite defined fluid collection to suggest pseudocyst is seen at this time. 5. Sigmoid diverticulosis without inflammation. 6. 4.6 x 2.3 cm low density is noted  within thickened right iliacus muscle concerning for developing abscess. Aortic Atherosclerosis (ICD10-I70.0). Electronically Signed  By: Marijo Conception M.D.   On: 10/20/2020 13:55   US Abdomen Limited RUQ (LIVER/GB)  Result Date: 10/21/2020 CLINICAL DATA:  Acute pancreatitis. EXAM: ULTRASOUND ABDOMEN LIMITED RIGHT UPPER QUADRANT COMPARISON:  None. FINDINGS: Gallbladder: A 1.4 cm gallstone is seen. There is no evidence of gallbladder dilatation or wall thickening. No evidence of pericholecystic fluid. No sonographic Murphy sign noted by sonographer. Common bile duct: Diameter: 2 mm, within normal limits. Liver: No focal lesion identified. Within normal limits in parenchymal echogenicity. Portal vein is patent on color Doppler imaging with normal direction of blood flow towards the liver. Other: Right pleural effusion incidentally noted. IMPRESSION: Cholelithiasis. No sonographic signs of cholecystitis or biliary ductal dilatation. Right pleural effusion incidentally noted. Electronically Signed   By: Marlaine Hind M.D.   On: 10/21/2020 15:26    Labs: BMET Recent Labs  Lab 10/19/20 0358 10/19/20 1701 10/20/20 0715 10/20/20 1643 10/21/20 0327 10/21/20 1838 10/22/20 0234  NA 136 135 135 135 135 132* 133*  K 3.4* 3.6 4.3 4.4 4.3 5.7* 4.5  CL 97* 96* 97* 100 100 100 98  CO2 28 27 26 25 25  17* 24  GLUCOSE 204* 187* 167* 163* 147* 194* 146*  BUN 31* 28* 23 27* 28* 32* 31*  CREATININE 2.60* 2.32* 1.96* 1.89* 1.77* 1.61* 1.58*  CALCIUM 6.6* 6.7* 7.2* 7.5* 7.5* 7.4* 7.6*  PHOS 2.8 2.9  2.9 2.6 2.4* 2.2* 3.6 2.9   CBC Recent Labs  Lab 10/16/20 0949 10/17/20 0443 10/18/20 0352 10/19/20 0358 10/20/20 0715 10/21/20 0328 10/22/20 0234  WBC 50.7* 46.4* 43.8* 46.0* 58.0* 78.9* 85.4*  NEUTROABS 39.1* 34.6* 29.1* 38.6*  --   --   --   HGB 10.9* 9.2* 8.6* 8.2* 8.4* 8.0* 7.9*  HCT 31.9* 26.4* 24.4* 22.9* 25.1* 24.5* 24.3*  MCV 105.3* 104.3* 101.7* 100.9* 107.7* 109.4* 107.5*  PLT 170 151 162  168 146* 156 172    Medications:    . B-complex with vitamin C  1 tablet Per Tube Daily  . Chlorhexidine Gluconate Cloth  6 each Topical Daily  . feeding supplement (PROSource TF)  45 mL Per Tube BID  . Gerhardt's butt cream   Topical TID  . insulin aspart  3-9 Units Subcutaneous Q4H  . mouth rinse  15 mL Mouth Rinse BID  . pantoprazole sodium  40 mg Per Tube Daily  . predniSONE  40 mg Per Tube Q breakfast   Followed by  . [START ON 10/23/2020] predniSONE  30 mg Per Tube Q breakfast   Followed by  . [START ON 10/26/2020] predniSONE  20 mg Per Tube Q breakfast   Followed by  . [START ON 10/29/2020] predniSONE  10 mg Per Tube Q breakfast  . sodium chloride flush  10-40 mL Intracatheter Q12H  . sodium chloride flush  3 mL Intravenous Q12H  . zinc sulfate  220 mg Per Tube Daily      Raymond Hurley  10/22/2020, 7:16 AM

## 2020-10-22 NOTE — Progress Notes (Signed)
ANTICOAGULATION CONSULT NOTE - Follow Up Consult  Pharmacy Consult for heparin Indication: atrial fibrillation  Labs: Recent Labs    10/20/20 0715 10/20/20 1643 10/21/20 0327 10/21/20 0328 10/21/20 1838 10/22/20 0234  HGB 8.4*  --   --  8.0*  --  7.9*  HCT 25.1*  --   --  24.5*  --  24.3*  PLT 146*  --   --  156  --  172  APTT  --  37*  --  73*  --  126*  HEPARINUNFRC  --  0.40 0.57  --   --  0.83*  CREATININE 1.96* 1.89* 1.77*  --  1.61*  --     Assessment: 87yo male supratherapeutic on heparin after one PTT at goal; no gtt issues or signs of bleeding per RN.  Goal of Therapy:  aPTT 66-102 seconds   Plan:  Will decrease heparin gtt to 850 units/hr (between the last two rates) and check labs in 8 hours.    Wynona Neat, PharmD, BCPS  10/22/2020,4:35 AM

## 2020-10-22 NOTE — Progress Notes (Signed)
ANTICOAGULATION CONSULT NOTE - Follow Up Consult  Pharmacy Consult for heparin Indication: atrial fibrillation  Labs: Recent Labs    10/20/20 0715 10/20/20 1643 10/21/20 0327 10/21/20 0328 10/21/20 1838 10/22/20 0234 10/22/20 1411  HGB 8.4*  --   --  8.0*  --  7.9*  --   HCT 25.1*  --   --  24.5*  --  24.3*  --   PLT 146*  --   --  156  --  172  --   APTT  --    < >  --  73*  --  126* 124*  HEPARINUNFRC  --    < > 0.57  --   --  0.83* 0.82*  CREATININE 1.96*   < > 1.77*  --  1.61* 1.58* 1.65*   < > = values in this interval not displayed.    Assessment: 87yo male supratherapeutic on heparin. Heparin level and aptt both elevated this afternoon after lab delay. Hgb stable overnight to 7.9. No bleeding noted per nursing.   Goal of Therapy:  aPTT 66-102 seconds   Plan:  Hold heparin for 1 hour then restart at 600 units/hr.  Recheck aptt in 8 hours  Erin Hearing PharmD., BCPS Clinical Pharmacist 10/22/2020 4:21 PM

## 2020-10-22 NOTE — Progress Notes (Signed)
Pt has ring on left ring finger... Adv pt that the ring needs to come off since his hand/fingers are getting swollen.   Pt refused and said it was a "diamond ring"  Will notify day shift to see if family comes in, to have them take his belongings w/them.

## 2020-10-22 NOTE — Progress Notes (Signed)
C/C : Pancreatitis, severe sepsis  S: Patient seen and examined at bedside.  Patient remains lethargic and confused.  Not able to give any history.  Case discussed with RN at bedside.  Chart reviewed.  On physical exam, he is lethargic.  Confused.  Abdomen is distended.  Bowel sounds present.  Not able to appreciate abdominal tenderness.  No peritoneal signs.  Significantly elevated white counts at 85.4 today.   CT scan showed ongoing pancreatitis with severe inflammatory changes around the pancreas.  Also showed large gallstone.  CT scan also showed around 4.6 cm low-density at the right iliacus  concerning for developing abscess.   Assessment  -----------------  -Abnormal CT scan showing large amount of inflammatory changes around the pancreas concerning for severe acute pancreatitis.  CT scan was without contrast.  Patient denied any abdominal pain.  Normal lipase .  Normal triglycerides.  Etiology of pancreatitis undetermined. ??  Gallstone pancreatitis versus autoimmune pancreatitis.   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  Last CT scan in October 2021 at [facility] showed pancreatic calcification.   - Possible ilacus abscess on CT from 1/7   -Significant leukocytosis. ??  Hematological abnormality. -Atrial fibrillation  with hypotension.   -Severe acute kidney injury : Improving on dialysis -Acute respiratory failure with aspiration pneumonia along with COVID-19 infection -CHF  -Hypocalcemia.  Slowly improving  -Diarrhea alternating with constipation.  C. difficile pending   Recommendations  --------------------------  -Patient remains critically ill.  Remains tachycardic.  Significantly elevated white counts at 85.4.  He is currently on meropenem.  -Recommend hematology consultation for severe leukocytosis -His creatinine has improved to 1.58 on CRRT. -Recheck LFTs tomorrow.  -May consider MRI MRCP in the next few days with IV contrast if okay with  nephrology.  IV contrast will help to rule out pancreatic necrosis, which may not change management at this time as patient is not a candidate for pancreatic necrosectomy and he is already covered by IV meropenem for possible infection.  -Patient remains critically ill.  Remains in ICU.  Prognosis poor  Otis Brace MD, FACP 10/22/2020, 11:20 AM  Contact #  [phone]

## 2020-10-23 DIAGNOSIS — R6520 Severe sepsis without septic shock: Secondary | ICD-10-CM | POA: Diagnosis not present

## 2020-10-23 DIAGNOSIS — I4819 Other persistent atrial fibrillation: Secondary | ICD-10-CM | POA: Diagnosis not present

## 2020-10-23 DIAGNOSIS — A419 Sepsis, unspecified organism: Secondary | ICD-10-CM | POA: Diagnosis not present

## 2020-10-23 LAB — RENAL FUNCTION PANEL
Albumin: 2.5 g/dL — ABNORMAL LOW (ref 3.5–5.0)
Albumin: 2.4 g/dL — ABNORMAL LOW (ref 3.5–5.0)
Anion gap: 14 (ref 5–15)
Anion gap: 11 (ref 5–15)
BUN: 29 mg/dL — ABNORMAL HIGH (ref 8–23)
BUN: 26 mg/dL — ABNORMAL HIGH (ref 8–23)
CO2: 21 mmol/L — ABNORMAL LOW (ref 22–32)
CO2: 26 mmol/L (ref 22–32)
Calcium: 7.7 mg/dL — ABNORMAL LOW (ref 8.9–10.3)
Calcium: 7.5 mg/dL — ABNORMAL LOW (ref 8.9–10.3)
Chloride: 98 mmol/L (ref 98–111)
Chloride: 97 mmol/L — ABNORMAL LOW (ref 98–111)
Creatinine, Ser: 1.5 mg/dL — ABNORMAL HIGH (ref 0.61–1.24)
Creatinine, Ser: 1.74 mg/dL — ABNORMAL HIGH (ref 0.61–1.24)
GFR, Estimated: 45 mL/min — ABNORMAL LOW (ref >60)
GFR, Estimated: 37 mL/min — ABNORMAL LOW (ref >60)
Glucose, Bld: 94 mg/dL (ref 70–99)
Glucose, Bld: 143 mg/dL — ABNORMAL HIGH (ref 70–99)
Phosphorus: 3.4 mg/dL (ref 2.5–4.6)
Phosphorus: 3.6 mg/dL (ref 2.5–4.6)
Potassium: 5.4 mmol/L — ABNORMAL HIGH (ref 3.5–5.1)
Potassium: 3.8 mmol/L (ref 3.5–5.1)
Sodium: 133 mmol/L — ABNORMAL LOW (ref 135–145)
Sodium: 134 mmol/L — ABNORMAL LOW (ref 135–145)

## 2020-10-23 LAB — CBC
HCT: 23.2 % — ABNORMAL LOW (ref 39.0–52.0)
Hemoglobin: 7.3 g/dL — ABNORMAL LOW (ref 13.0–17.0)
MCH: 35.1 pg — ABNORMAL HIGH (ref 26.0–34.0)
MCHC: 31.5 g/dL (ref 30.0–36.0)
MCV: 111.5 fL — ABNORMAL HIGH (ref 80.0–100.0)
Platelets: 124 10*3/uL — ABNORMAL LOW (ref 150–400)
RBC: 2.08 MIL/uL — ABNORMAL LOW (ref 4.22–5.81)
RDW: 16.5 % — ABNORMAL HIGH (ref 11.5–15.5)
WBC: 85.8 10*3/uL (ref 4.0–10.5)
nRBC: 0.7 % — ABNORMAL HIGH (ref 0.0–0.2)

## 2020-10-23 LAB — APTT: aPTT: 75 seconds — ABNORMAL HIGH (ref 24–36)

## 2020-10-23 LAB — GLUCOSE, CAPILLARY
Glucose-Capillary: 120 mg/dL — ABNORMAL HIGH (ref 70–99)
Glucose-Capillary: 137 mg/dL — ABNORMAL HIGH (ref 70–99)
Glucose-Capillary: 141 mg/dL — ABNORMAL HIGH (ref 70–99)
Glucose-Capillary: 134 mg/dL — ABNORMAL HIGH (ref 70–99)
Glucose-Capillary: 147 mg/dL — ABNORMAL HIGH (ref 70–99)
Glucose-Capillary: 126 mg/dL — ABNORMAL HIGH (ref 70–99)

## 2020-10-23 LAB — HEPARIN LEVEL (UNFRACTIONATED): Heparin Unfractionated: 0.57 IU/mL (ref 0.30–0.70)

## 2020-10-23 LAB — MAGNESIUM: Magnesium: 2.6 mg/dL — ABNORMAL HIGH (ref 1.7–2.4)

## 2020-10-23 MED ORDER — VITAL AF 1.2 CAL PO LIQD
1000.0000 mL | ORAL | Status: DC
  Administered 2020-10-23: 1000 mL
  Filled 2020-10-23: qty 1000

## 2020-10-23 MED ORDER — DIGOXIN 0.25 MG/ML IJ SOLN
0.1250 mg | Freq: Once | INTRAMUSCULAR | Status: AC
  Administered 2020-10-23: 0.125 mg via INTRAVENOUS
  Filled 2020-10-23: qty 2

## 2020-10-23 MED ORDER — PHENOL 1.4 % MT LIQD
1.0000 | Freq: Once | OROMUCOSAL | Status: DC
  Filled 2020-10-23: qty 177

## 2020-10-23 MED ORDER — PRISMASOL BGK 4/2.5 32-4-2.5 MEQ/L EC SOLN
Status: DC
  Filled 2020-10-23 (×28): qty 5000

## 2020-10-23 MED ORDER — PRISMASOL BGK 4/2.5 32-4-2.5 MEQ/L REPLACEMENT SOLN
Status: DC
  Filled 2020-10-23 (×2): qty 5000

## 2020-10-23 MED ORDER — HYDROCORTISONE NA SUCCINATE PF 100 MG IJ SOLR
100.0000 mg | Freq: Three times a day (TID) | INTRAMUSCULAR | Status: DC
  Administered 2020-10-23 – 2020-10-25 (×7): 100 mg via INTRAVENOUS
  Filled 2020-10-23 (×8): qty 2

## 2020-10-23 MED ORDER — PRISMASOL BGK 4/2.5 32-4-2.5 MEQ/L REPLACEMENT SOLN
Status: DC
  Filled 2020-10-23 (×6): qty 5000

## 2020-10-23 MED ORDER — PRISMASOL BGK 0/2.5 32-2.5 MEQ/L EC SOLN
Status: DC
  Filled 2020-10-23 (×3): qty 5000

## 2020-10-23 MED ORDER — NOREPINEPHRINE 16 MG/250ML-% IV SOLN
0.0000 ug/min | INTRAVENOUS | Status: DC
  Administered 2020-10-23: 9 ug/min via INTRAVENOUS
  Filled 2020-10-23: qty 250

## 2020-10-23 MED ORDER — SODIUM ZIRCONIUM CYCLOSILICATE 10 G PO PACK
10.0000 g | PACK | Freq: Once | ORAL | Status: AC
  Administered 2020-10-23: 10 g
  Filled 2020-10-23: qty 1

## 2020-10-23 NOTE — Progress Notes (Signed)
Patient has converted to NSR with rate 80's

## 2020-10-23 NOTE — Progress Notes (Signed)
° °  Progress Note  Patient Name: Raymond Hurley. Date of Encounter: 10/23/2020  [facility] Cardiologist: Kirk Ruths, MD     For questions or updates, please contact [facility] Please consult [email or web address] for contact info under   87 year old male with past medical history of melanoma metastatic to pancreas, atrial fibrillation admitted with pancreatitis and found to be Covid positive.  Course complicated by shock, iliopsoas abscess, respiratory failure and encephalopathy.  Cardiology following for atrial fibrillation.  Echocardiogram on January 3 showed normal LV function, mild biatrial enlargement.  Telemetry has been personally reviewed.  Patient's heart rate remains elevated in the 120-130 range.  This is at least partially driven by sepsis.  Continue IV amiodarone.  We cannot add beta-blockade or calcium blocker as patient is hypotensive requiring norepinephrine.    Signed, Kirk Ruths, MD  10/23/2020, 11:04 AM

## 2020-10-23 NOTE — Progress Notes (Addendum)
K+ 5.4 reported to Dr Barry Dienes. No orders to review in rounds

## 2020-10-23 NOTE — Progress Notes (Signed)
NAME:  Raymond Hurley., MRN:  [id number], DOB:  1931-08-25, LOS: 9 ADMISSION DATE:  10/14/2020, CONSULTATION DATE:  10/14/2020 REFERRING MD:  [place] [facility], CHIEF COMPLAINT:  Hypotension.    HPI/course in hospital   87 year old man who is hypotensive in the context of acute diarrhea on 1/1 was admitted w/ dx of COVID and acute dehydration from acute diarrheal illness. Of note he was fully vaccinated and had received his booster.  Critical care was consulted given hypotension however this responded to IV hydration and supportive care. Hospital course from 1/1 through 1/3:  Subsequently found to have aspiration pneumonia,  And, worsening delirium, and multiple organ failure, on 1/3  had worsening work of breathing more confusion, and CT of abdomen showed asymptomatic pancreatitis.  Given advanced age and multiple organ dysfunction in addition to concern about further clinical decline critical care asked to reevaluate  Past Medical History:   Atrial fibrillation, melanoma, vitamin B12 deficiency.  Systolic cardiomyopathy. Metastatic melanoma with mets to pancreas ([facility] at [facility])  Significant Hospital Events:  1/4 > hypotension and A.fib RVR.  Transferring to ICU. Started CRRT 1/7  Worsening leukocytosis.  CT abdomen pelvis with severe pancreatitis, possible iliopsoas abscess.  Deemed too unstable for intervention per surgery and interventional radiology 1/8 started pressors.  Antibiotics broadened to meropenem and vancomycin.  Made DNR  Consults:  PCCM, nephrology  Procedures:  Right IJ HD cath 1/4  Significant Diagnostic Tests:    Micro Data:  Blood culture 1/1 > no growth Blood culture 1/7 >  no growth  Antimicrobials:  Unasyn 1/2 >> 1/4 Cefepime 1/4 >> 1/7 Flagyl 1/6 >> 1/7  Meropenem 1/7 >>  Vancomycin 1/8 >>   Interim History / Subjective:   Remains on CRRT WBC count is plateauing off Had episode of rapid A. fib overnight and started on amiodarone Continues on  heparin and Levophed  Objective   Blood pressure 118/70, pulse (!) 132, temperature (!) 97.2 F (36.2 C), temperature source Axillary, resp. rate 18, height 5\' 8"  (1.727 m), weight 99.2 kg, SpO2 93 %.        Intake/Output Summary (Last 24 hours) at 10/23/2020 0755 Last data filed at 10/23/2020 0700 Gross per 24 hour  Intake 2360.35 ml  Output 5731 ml  Net -3370.65 ml   Filed Weights   10/21/20 0400 10/22/20 0500 10/23/20 0325  Weight: 103.4 kg 100.1 kg 99.2 kg    Examination:  Gen:      No acute distress HEENT:  EOMI, sclera anicteric Neck:     No masses; no thyromegaly Lungs:    Clear to auscultation bilaterally; normal respiratory effort CV:         Regular rate and rhythm; no murmurs Abd:      + bowel sounds; soft, non-tender; no palpable masses, no distension Ext:    No edema; adequate peripheral perfusion Skin:      Warm and dry; no rash Neuro: Somnolent, arousable, confused  Labs/imaging reviewed Significant for potassium 5.4, BUN/creatinine 29/1.5 WBC 85.8, hemoglobin 7.3, platelets 124 No new imaging  Assessment & Plan:  Acute renal failure, requiring CVVHD Suspect all secondary to ATN after recent diarrheal illness and volume depletion Plan: Continue CVVH  Acute hypoxic respiratory failure secondary to aspiration pneumonia Increasing leukocytosis, concern for iliacus abscess but too unstable for any procedure Septic shock Plan Continue empiric meropenem and vancomycin Weaning pressors as tolerated Change Solu-Medrol to stress dose steroids.  Systolic cardiomyopathy with hx PAF -  now in A.fib with RVR.  Echo 1/3 with EF 55 - 60% Plan Heparin, amio drip  COVID-19 infection Plan Supportive care  Acute metabolic encephalopathy,  Multifactorial secondary to metabolic acidosis, uremia, possible sepsis Plan CRRT   Acute pancreatitis - unclear etiology.  Lipase, trigs, Tbili normal.  Denies EtOH use.  Of note, does have a hx of metastatic melanoma with  mets to pancreas, s/p 30 infusions of Pembrolizumab in July 2018.Marland Kitchen Plan Supportive care  Best practice (evaluated daily)  Diet: Tube feeds Pain/Anxiety/Delirium protocol (if indicated): NA VAP protocol (if indicated): NA DVT prophylaxis: heparin GI prophylaxis: PPI  Glucose control: SSI  Mobility: BR Disposition:ICU  Goals of Care:  Last date of multidisciplinary goals of care discussion: Spoke with wife on 1/8 Family and staff present: NA Summary of discussion: continue full support  Follow up goals of care discussion due: in 7 days  Code Status: FULL   The patient is critically ill with multiple organ system failure and requires high complexity decision making for assessment and support, frequent evaluation and titration of therapies, advanced monitoring, review of radiographic studies and interpretation of complex data.   Critical Care Time devoted to patient care services, exclusive of separately billable procedures, described in this note is 35 minutes.   Marshell Garfinkel MD  Pulmonary and Critical Care Please see [email or web address] for pager details.  10/23/2020, 7:55 AM

## 2020-10-23 NOTE — Progress Notes (Signed)
K+3.8 called to Dr Augustin Coupe

## 2020-10-23 NOTE — Progress Notes (Signed)
[place] Progress Note Patient Name: Raymond Hurley. DOB: [birth year] MRN: [id number]   Date of Service  10/23/2020  HPI/Events of Note  has been consistently staying in 120- 130's with his AFib   111/62   on Amio drip of 30 and Levo of 8, was wondering if bolus would benefit pt  87 year old man with a history of melanoma with metastatic disease to pancreas is admitted for severe acute pancreatitis. Ileus. Discussed with RN. Data: K 5.4, Cr 1.5   eICU Interventions  - Trial of low dose dig 125 mcg once. Avoiding fluid bolus.      Intervention Category Intermediate Interventions: Arrhythmia - evaluation and management  Elmer Sow 10/23/2020, 5:37 AM

## 2020-10-23 NOTE — Progress Notes (Signed)
ANTICOAGULATION CONSULT NOTE - Follow Up Consult  Pharmacy Consult for heparin Indication: atrial fibrillation  Labs: Recent Labs    10/21/20 0328 10/21/20 1838 10/22/20 0234 10/22/20 1411 10/23/20 0232 10/23/20 0233  HGB 8.0*  --  7.9*  --  7.3*  --   HCT 24.5*  --  24.3*  --  23.2*  --   PLT 156  --  172  --  124*  --   APTT 73*  --  126* 124* 75*  --   HEPARINUNFRC  --   --  0.83* 0.82* 0.57  --   CREATININE  --    < > 1.58* 1.65*  --  1.50*   < > = values in this interval not displayed.    Assessment: 87yo male therapeutic on heparin. Heparin level and aptt within goal range. Hgb stable overnight to 7.3. No bleeding noted per nursing.   Goal of Therapy:  aPTT 66-102 seconds Heparin level 0.3 - 0.7   Plan:  Continue heparin at 600 units/hr.  Will convert to Heparin levels only Recheck heparin level with am labs  Alanda Slim, PharmD, [facility] Clinical Pharmacist Please see AMION for all Pharmacists' Contact Phone Numbers 10/23/2020, 7:06 AM

## 2020-10-23 NOTE — Progress Notes (Signed)
Patient continues to show atrial fibrillation of rate 137 continuous on monitor. Dr Vaughan Browner informed

## 2020-10-23 NOTE — Progress Notes (Signed)
[facility] Progress Note    Assessment/ Plan:   1. Acute renal failure - baseline cr 1.2 in June 2021, now with severe AKI in the setting of multiple insults including COVID, diarrhea, and pancreatitis (also was taking entresto).  Severe AKI with Cr up to 8.6, falling UOP CV started 1/4.  1. Remains fluid overloaded, attempt to increase ultrafiltration 100 to 150 cc/h as tolerated 2. Maintain all CRRT fluid with 2K for today given recent hyperkalemia 3. One dose lokelma today  2. Acute pancreatitis: Noted on CT scan 10/16/20 and again on 1/7. 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  Had a hospitalization at [facility] for pancreatitis 2016 when peripancreatic lymph node leading to dx first discovered. Mgmt per primary.   3. Hypocalcemia: multifactorial. Corrects to near normal when accounting for albumin. No calcium needed but can replete prn  4. Acute hypoxemic RF: likely aspiration PNA +/- COVID pneumonitis, abx per primary (on cefepime and falgyl currently). CT chest with ongoing pna on 1/7.  5. Shock: high WBC count, w/u per primary team. On antibiotics and pressors. Possible ilacus abscess on CT from 1/7  6. COVID-19 infection: vaccinated and boosted- treatment per primary  7. Acute encephalopathy: multifactorial, likely COVID + acute illness. CTM  8.  Chronic systolic CHF: EF 10/17/46 on TTE 60-65% with no WMA. Fluid removal as above  9.  Afib: hep and amiodarone gtts  10.  Dispo: critically ill and remains in the ICU    Subjective:   Due to the COVID pandemic, in attempts to limit transmission and over-utilization of resources (e.g. PPE), the patient was seen virtually today by means of chart review, discussion with staff, and virtual discussion with patient as needed.  Continues on CV. No documented UOP. Net neg ~3.3L, remains on levo.   Objective:   BP (!) 84/50   Pulse (!) 127   Temp  98.3 F (36.8 C) (Axillary)   Resp (!) 22   Ht 5\' 8"  (1.727 m)   Wt 99.2 kg   SpO2 93%   BMI 33.25 kg/m   Intake/Output Summary (Last 24 hours) at 10/23/2020 1309 Last data filed at 10/23/2020 1200 Gross per 24 hour  Intake 1912.23 ml  Output 5208 ml  Net -3295.77 ml   Weight change: -0.9 kg  Physical Exam:    Not examined today  Imaging: DG Abd 1 View  Result Date: 10/22/2020 CLINICAL DATA:  Abdominal distension. COVID-19 viral infection. Sepsis. EXAM: ABDOMEN - 1 VIEW COMPARISON:  10/18/2020 FINDINGS: Feeding tube remains in place with the distal tip overlying the expected region of the duodenal bulb. Mild dilatation of several small bowel loops is noted in the left lower quadrant. Mild gaseous distention of colon also noted. These findings are suspicious for mild ileus. IMPRESSION: Feeding tube tip overlies the expected region of the duodenal bulb. Findings suspicious for mild ileus. Electronically Signed   By: Marlaine Hind M.D.   On: 10/22/2020 08:53   DG CHEST PORT 1 VIEW  Result Date: 10/22/2020 CLINICAL DATA:  Abdominal distension EXAM: PORTABLE CHEST 1 VIEW COMPARISON:  Five days ago FINDINGS: Feeding tube that at least reaches the stomach. Right IJ line in stable position. History of COVID with stable bilateral opacity. Stable heart size and mediastinal contours with upper vascular pedicle widening from vessels based on recent CT. No visible effusion or pneumothorax. IMPRESSION: Stable multifocal pneumonia. Electronically Signed   By: Angelica Chessman  Watts M.D.   On: 10/22/2020 08:53   US Abdomen Limited RUQ (LIVER/GB)  Result Date: 10/21/2020 CLINICAL DATA:  Acute pancreatitis. EXAM: ULTRASOUND ABDOMEN LIMITED RIGHT UPPER QUADRANT COMPARISON:  None. FINDINGS: Gallbladder: A 1.4 cm gallstone is seen. There is no evidence of gallbladder dilatation or wall thickening. No evidence of pericholecystic fluid. No sonographic Murphy sign noted by sonographer. Common bile duct: Diameter: 2 mm,  within normal limits. Liver: No focal lesion identified. Within normal limits in parenchymal echogenicity. Portal vein is patent on color Doppler imaging with normal direction of blood flow towards the liver. Other: Right pleural effusion incidentally noted. IMPRESSION: Cholelithiasis. No sonographic signs of cholecystitis or biliary ductal dilatation. Right pleural effusion incidentally noted. Electronically Signed   By: Marlaine Hind M.D.   On: 10/21/2020 15:26    Labs: BMET Recent Labs  Lab 10/20/20 0715 10/20/20 1643 10/21/20 0327 10/21/20 1838 10/22/20 0234 10/22/20 1411 10/23/20 0233  NA 135 135 135 132* 133* 134* 133*  K 4.3 4.4 4.3 5.7* 4.5 4.2 5.4*  CL 97* 100 100 100 98 99 98  CO2 26 25 25  17* 24 26 21*  GLUCOSE 167* 163* 147* 194* 146* 163* 94  BUN 23 27* 28* 32* 31* 29* 29*  CREATININE 1.96* 1.89* 1.77* 1.61* 1.58* 1.65* 1.50*  CALCIUM 7.2* 7.5* 7.5* 7.4* 7.6* 7.7* 7.7*  PHOS 2.6 2.4* 2.2* 3.6 2.9 3.3 3.4   CBC Recent Labs  Lab 10/17/20 0443 10/18/20 0352 10/19/20 0358 10/20/20 0715 10/21/20 0328 10/22/20 0234 10/23/20 0232  WBC 46.4* 43.8* 46.0* 58.0* 78.9* 85.4* 85.8*  NEUTROABS 34.6* 29.1* 38.6*  --   --   --   --   HGB 9.2* 8.6* 8.2* 8.4* 8.0* 7.9* 7.3*  HCT 26.4* 24.4* 22.9* 25.1* 24.5* 24.3* 23.2*  MCV 104.3* 101.7* 100.9* 107.7* 109.4* 107.5* 111.5*  PLT 151 162 168 146* 156 172 124*    Medications:    . acetaminophen (TYLENOL) oral liquid 160 mg/5 mL  650 mg Per Tube Q6H  . B-complex with vitamin C  1 tablet Per Tube Daily  . Chlorhexidine Gluconate Cloth  6 each Topical Daily  . feeding supplement (VITAL AF 1.2 CAL)  1,000 mL Per Tube Q24H  . Gerhardt's butt cream   Topical TID  . hydrocortisone sod succinate (SOLU-CORTEF) inj  100 mg Intravenous Q8H  . insulin aspart  3-9 Units Subcutaneous Q4H  . mouth rinse  15 mL Mouth Rinse BID  . phenol  1 spray Mouth/Throat Once  . sodium chloride flush  10-40 mL Intracatheter Q12H  . sodium chloride  flush  3 mL Intravenous Q12H      Nehemiah Mcfarren  10/23/2020, 1:09 PM

## 2020-10-23 NOTE — Progress Notes (Signed)
Patient ID: Raymond Hurley., male   DOB: 04-22-1931, 87 y.o.   MRN: [id number]  Chart reviewed. Severe leucocytosis of 85.8. Covid infection. Aspiration Pneumonia. Sepsis. Afib with RVR. ARF on CVVHD.  Acute pancreatitis of unclear etiology. Would not recommend MRCP at this time since it will not change current management with him being on IV Meropenem. Supportive care per CCM. No new GI recs. Refer to Dr. Leanna Sato note from 10/22/20 for GI recs. Will sign off. Call us back if any questions.

## 2020-10-23 NOTE — Progress Notes (Addendum)
[place] Progress Note Patient Name: Raymond Hurley. DOB: [birth year] MRN: [id number]   Date of Service  10/23/2020  HPI/Events of Note  Sore throat, asking for spray; low risk, moderna made rash/hives. Cross reaction to Chloraseptic spray. Ordered once. Watch for any reaction.   eICU Interventions       Intervention Category Minor Interventions: Other:;Routine modifications to care plan (e.g. PRN medications for pain, fever)  Elmer Sow 10/23/2020, 4:39 AM

## 2020-10-24 ENCOUNTER — Encounter (HOSPITAL_COMMUNITY): Payer: Self-pay | Admitting: Internal Medicine

## 2020-10-24 DIAGNOSIS — R6520 Severe sepsis without septic shock: Secondary | ICD-10-CM | POA: Diagnosis not present

## 2020-10-24 DIAGNOSIS — A419 Sepsis, unspecified organism: Secondary | ICD-10-CM | POA: Diagnosis not present

## 2020-10-24 LAB — CBC
HCT: 21.9 % — ABNORMAL LOW (ref 39.0–52.0)
Hemoglobin: 7.1 g/dL — ABNORMAL LOW (ref 13.0–17.0)
MCH: 35.5 pg — ABNORMAL HIGH (ref 26.0–34.0)
MCHC: 32.4 g/dL (ref 30.0–36.0)
MCV: 109.5 fL — ABNORMAL HIGH (ref 80.0–100.0)
Platelets: 170 10*3/uL (ref 150–400)
RBC: 2 MIL/uL — ABNORMAL LOW (ref 4.22–5.81)
RDW: 16.3 % — ABNORMAL HIGH (ref 11.5–15.5)
WBC: 71.9 10*3/uL (ref 4.0–10.5)
nRBC: 0.5 % — ABNORMAL HIGH (ref 0.0–0.2)

## 2020-10-24 LAB — MAGNESIUM: Magnesium: 2.6 mg/dL — ABNORMAL HIGH (ref 1.7–2.4)

## 2020-10-24 LAB — GLUCOSE, CAPILLARY
Glucose-Capillary: 109 mg/dL — ABNORMAL HIGH (ref 70–99)
Glucose-Capillary: 112 mg/dL — ABNORMAL HIGH (ref 70–99)
Glucose-Capillary: 125 mg/dL — ABNORMAL HIGH (ref 70–99)
Glucose-Capillary: 134 mg/dL — ABNORMAL HIGH (ref 70–99)
Glucose-Capillary: 153 mg/dL — ABNORMAL HIGH (ref 70–99)
Glucose-Capillary: 160 mg/dL — ABNORMAL HIGH (ref 70–99)

## 2020-10-24 LAB — RENAL FUNCTION PANEL
Albumin: 2.3 g/dL — ABNORMAL LOW (ref 3.5–5.0)
Albumin: 2.5 g/dL — ABNORMAL LOW (ref 3.5–5.0)
Anion gap: 11 (ref 5–15)
Anion gap: 12 (ref 5–15)
BUN: 27 mg/dL — ABNORMAL HIGH (ref 8–23)
BUN: 28 mg/dL — ABNORMAL HIGH (ref 8–23)
CO2: 25 mmol/L (ref 22–32)
CO2: 25 mmol/L (ref 22–32)
Calcium: 7.7 mg/dL — ABNORMAL LOW (ref 8.9–10.3)
Calcium: 7.9 mg/dL — ABNORMAL LOW (ref 8.9–10.3)
Chloride: 101 mmol/L (ref 98–111)
Chloride: 99 mmol/L (ref 98–111)
Creatinine, Ser: 1.63 mg/dL — ABNORMAL HIGH (ref 0.61–1.24)
Creatinine, Ser: 1.7 mg/dL — ABNORMAL HIGH (ref 0.61–1.24)
GFR, Estimated: 41 mL/min — ABNORMAL LOW (ref >60)
GFR, Estimated: 39 mL/min — ABNORMAL LOW (ref >60)
Glucose, Bld: 127 mg/dL — ABNORMAL HIGH (ref 70–99)
Glucose, Bld: 133 mg/dL — ABNORMAL HIGH (ref 70–99)
Phosphorus: 3.4 mg/dL (ref 2.5–4.6)
Phosphorus: 2.9 mg/dL (ref 2.5–4.6)
Potassium: 3.6 mmol/L (ref 3.5–5.1)
Potassium: 3.5 mmol/L (ref 3.5–5.1)
Sodium: 137 mmol/L (ref 135–145)
Sodium: 136 mmol/L (ref 135–145)

## 2020-10-24 LAB — HEPARIN LEVEL (UNFRACTIONATED)
Heparin Unfractionated: 0.19 IU/mL — ABNORMAL LOW (ref 0.30–0.70)
Heparin Unfractionated: 0.32 IU/mL (ref 0.30–0.70)

## 2020-10-24 MED ORDER — ACETAMINOPHEN 160 MG/5ML PO SOLN
650.0000 mg | Freq: Four times a day (QID) | ORAL | Status: DC
  Administered 2020-10-24 – 2020-10-28 (×15): 650 mg via ORAL
  Filled 2020-10-24 (×15): qty 20.3

## 2020-10-24 MED ORDER — B COMPLEX-C PO TABS
1.0000 | ORAL_TABLET | Freq: Every day | ORAL | Status: DC
  Administered 2020-10-25 – 2020-10-27 (×3): 1 via ORAL
  Filled 2020-10-24 (×4): qty 1

## 2020-10-24 MED ORDER — SODIUM CHLORIDE 0.9 % IV SOLN
1.0000 g | Freq: Three times a day (TID) | INTRAVENOUS | Status: DC
  Administered 2020-10-24 – 2020-10-26 (×5): 1 g via INTRAVENOUS
  Filled 2020-10-24 (×7): qty 1

## 2020-10-24 MED ORDER — VITAL AF 1.2 CAL PO LIQD: 1000.0000 mL | ORAL | Status: DC

## 2020-10-24 MED ORDER — POTASSIUM CHLORIDE 10 MEQ/100ML IV SOLN
10.0000 meq | Freq: Once | INTRAVENOUS | Status: AC
  Administered 2020-10-24: 10 meq via INTRAVENOUS
  Filled 2020-10-24: qty 100

## 2020-10-24 MED ORDER — VITAL AF 1.2 CAL PO LIQD
1000.0000 mL | ORAL | Status: DC
  Administered 2020-10-24 – 2020-10-27 (×3): 1000 mL
  Filled 2020-10-24 (×5): qty 1000

## 2020-10-24 MED ORDER — CHLORHEXIDINE GLUCONATE CLOTH 2 % EX PADS
6.0000 | MEDICATED_PAD | Freq: Every day | CUTANEOUS | Status: DC
  Administered 2020-10-25 – 2020-10-26 (×2): 6 via TOPICAL

## 2020-10-24 MED ORDER — POTASSIUM CHLORIDE 10 MEQ/50ML IV SOLN
10.0000 meq | INTRAVENOUS | Status: DC
Start: 2020-10-24 — End: 2020-10-24
  Filled 2020-10-24: qty 50

## 2020-10-24 MED ORDER — SODIUM CHLORIDE 0.9 % IV SOLN
1.0000 g | Freq: Two times a day (BID) | INTRAVENOUS | Status: DC
  Filled 2020-10-24: qty 1

## 2020-10-24 MED ORDER — LINEZOLID 600 MG/300ML IV SOLN
600.0000 mg | Freq: Two times a day (BID) | INTRAVENOUS | Status: DC
  Administered 2020-10-25 – 2020-10-31 (×13): 600 mg via INTRAVENOUS
  Filled 2020-10-24 (×13): qty 300

## 2020-10-24 MED ORDER — ENSURE ENLIVE PO LIQD
237.0000 mL | Freq: Two times a day (BID) | ORAL | Status: DC
  Administered 2020-10-25 – 2020-10-27 (×4): 237 mL via ORAL
  Filled 2020-10-24 (×2): qty 237

## 2020-10-24 MED ORDER — TRIAMCINOLONE 0.1 % CREAM:EUCERIN CREAM 1:1
TOPICAL_CREAM | Freq: Two times a day (BID) | CUTANEOUS | Status: DC
  Administered 2020-11-06: 1 via TOPICAL
  Filled 2020-10-24 (×3): qty 1

## 2020-10-24 NOTE — Progress Notes (Signed)
ANTICOAGULATION CONSULT NOTE - Follow Up Consult  Pharmacy Consult for heparin Indication: atrial fibrillation  Labs: Recent Labs    10/22/20 0234 10/22/20 1411 10/23/20 0232 10/23/20 0233 10/23/20 1619 10/24/20 0612  HGB 7.9*  --  7.3*  --   --  7.1*  HCT 24.3*  --  23.2*  --   --  21.9*  PLT 172  --  124*  --   --  170  APTT 126* 124* 75*  --   --   --   HEPARINUNFRC 0.83* 0.82* 0.57  --   --  0.19*  CREATININE 1.58* 1.65*  --  1.50* 1.74* 1.63*    Assessment: 87yo male subtherapeutic on heparin. Hgb low stable in the 7s, PLT stable at 170. No bleeding or infusion issues noted per nursing.   Goal of Therapy:  aPTT 66-102 seconds Heparin level 0.3 - 0.7   Plan:  Increase heparin to 700 units/hr.  8 hour heparin level Recheck heparin level with am labs  Norina Buzzard, PharmD PGY1 Pharmacy Resident 10/24/2020 11:38 AM

## 2020-10-24 NOTE — Progress Notes (Signed)
ANTICOAGULATION CONSULT NOTE - Follow Up Consult  Pharmacy Consult for heparin Indication: atrial fibrillation  Labs: Recent Labs    10/22/20 0234 10/22/20 1411 10/23/20 0232 10/23/20 0233 10/23/20 1619 10/24/20 0612 10/24/20 1624 10/24/20 2000  HGB 7.9*  --  7.3*  --   --  7.1*  --   --   HCT 24.3*  --  23.2*  --   --  21.9*  --   --   PLT 172  --  124*  --   --  170  --   --   APTT 126* 124* 75*  --   --   --   --   --   HEPARINUNFRC 0.83* 0.82* 0.57  --   --  0.19*  --  0.32  CREATININE 1.58* 1.65*  --    < > 1.74* 1.63* 1.70*  --    < > = values in this interval not displayed.    Assessment: 87yo male on heparin for afib.  Heparin level 0.32 units/ml  Goal of Therapy:  aPTT 66-102 seconds Heparin level 0.3 - 0.7   Plan:  Continue heparin at 700 units/hr.  Recheck heparin level with am labs  Thanks for allowing pharmacy to be a part of this patient's care.  Excell Seltzer, PharmD Clinical Pharmacist 10/24/2020 11:06 PM

## 2020-10-24 NOTE — Progress Notes (Signed)
Patient ID: [name]., male   DOB: [birth year], 87 y.o.   MRN: [id number]  This NP reviewed medical records and discussed case with team, patient remains in isolation 2/2 Covid 19 infection.    Patient requiring CVVHD, is tolerating TF, is intermittently confused.   Spoke to wife by telephone for palliative medicine needs and emotional support  Created space and opportunity for wife to explore her thoughts and feeling regarding her husbands current medical situation.   She verbalizes great optimism that she is getting reports that her husband is doing better and she feels confident in his return to baseline, "now we just have to get his kidneys to start working" .   Emotional support offered.  Questions and concerns addressed.  Education offered on the seriousness of her husband's medical situation and his   high risk secondary to multiple co-morbiditeis  Discussed with wife  the importance of continued conversation with family and the  medical providers regarding overall plan of care and treatment options,  ensuring decisions are within the context of the patients values and GOCs.  Questions and concerns addressed   Discussed with bedside RN  Total time spent on the unit was 15 minutes  Greater than 50% of the time was spent in counseling and coordination of care  Wadie Lessen NP  Palliative Medicine Team Team Phone # [phone] Pager [phone]

## 2020-10-24 NOTE — Progress Notes (Signed)
NAME:  Raymond Schrecengost., MRN:  [id number], DOB:  14-Aug-1931, LOS: 10 ADMISSION DATE:  10/14/2020, CONSULTATION DATE:  10/14/2020 REFERRING MD:  [place] - [facility], CHIEF COMPLAINT:  Hypotension.    HPI/course in hospital   87 year old man who is hypotensive in the context of acute diarrhea on 1/1 was admitted w/ dx of COVID and acute dehydration from acute diarrheal illness. Of note he was fully vaccinated and had received his booster.  Critical care was consulted given hypotension however this responded to IV hydration and supportive care. Hospital course from 1/1 through 1/3:  Subsequently found to have aspiration pneumonia,  And, worsening delirium, and multiple organ failure, on 1/3  had worsening work of breathing more confusion, and CT of abdomen showed asymptomatic pancreatitis.  Given advanced age and multiple organ dysfunction in addition to concern about further clinical decline critical care asked to reevaluate  Past Medical History:   Atrial fibrillation, melanoma, vitamin B12 deficiency.  Systolic cardiomyopathy. Metastatic melanoma with mets to pancreas (followed at [facility])  Significant Hospital Events:  1/4 > hypotension and A.fib RVR.  Transferring to ICU. Started CRRT 1/7  Worsening leukocytosis.  CT abdomen pelvis with severe pancreatitis, possible iliopsoas abscess.  Deemed too unstable for intervention per surgery and interventional radiology 1/8 started pressors.  Antibiotics broadened to meropenem and vancomycin.  Made DNR  Consults:  PCCM, nephrology  Procedures:  Right IJ HD cath 1/4  Significant Diagnostic Tests:    Micro Data:  Blood culture 1/1 > no growth Blood culture 1/7 >  no growth  Antimicrobials:  Unasyn 1/2 >> 1/4 Cefepime 1/4 >> 1/7 Flagyl 1/6 >> 1/7  Meropenem 1/7 >>  Vancomycin 1/8 >>   Interim History / Subjective:  T max 98.3 Remains on CRRT WBC count is plateauing off, 71.9 Remains on Amiodarone at 30 mg.hr Continues on heparin and  Levophed  Objective   Blood pressure (!) 138/51, pulse 82, temperature (!) 97 F (36.1 C), temperature source Axillary, resp. rate 17, height 5\' 8"  (1.727 m), weight 95.9 kg, SpO2 98 %.        Intake/Output Summary (Last 24 hours) at 10/24/2020 0747 Last data filed at 10/24/2020 0700 Gross per 24 hour  Intake 1776.7 ml  Output 4125 ml  Net -2348.3 ml   Filed Weights   10/22/20 0500 10/23/20 0325 10/24/20 0500  Weight: 100.1 kg 99.2 kg 95.9 kg    Examination:  Gen:      No acute distress, weak and deconditioned HEENT:  EOMI, sclera anicteric Neck:     No masses; no thyromegaly Lungs:    Bilateral chest excursion, Clear to auscultation bilaterally; diminished per bases CV:         S1, S2, Irr, No RMG, a fib per tele Abd:      + bowel sounds; soft, non-tender; no palpable masses, no distension Ext:    + 1+  Edema BLE; adequate peripheral perfusion Skin:      Warm and dry;  Neuro: Somnolent, arousable, confused  Labs/imaging reviewed Significant for potassium 3.6 , BUN/creatinine 27/1.63 Mag 2.6 WBC 71.9, hemoglobin 7.1, platelets 170 No new imaging Net Negative 1.95 L  Assessment & Plan:  Acute renal failure, requiring CVVHD Suspect all secondary to ATN after recent diarrheal illness and volume depletion Hypocalcemia corrects with albumin Plan: Continue CVVH per renal  Appreciate assist Trend BMET Strict I&O   Acute hypoxic respiratory failure secondary to aspiration pneumonia Increasing leukocytosis, concern for iliacus abscess but  too unstable for any procedure Septic shock Plan Continue empiric meropenem and vancomycin Weaning pressors as tolerated>> off 1/11 Change Solu-Medrol to stress dose steroids. Will get speech involved for swallow eval   Systolic cardiomyopathy with hx PAF - now in A.fib with RVR.  Echo 1/3 with EF 55 - 60% Plan Heparin, amio drip  COVID-19 infection Plan Supportive care CXR prn ABG prn  Acute metabolic encephalopathy,   Multifactorial secondary to metabolic acidosis, uremia, possible sepsis Plan CRRT  Frequent Neuro checks Treat underlying etiology  Acute pancreatitis - unclear etiology.  Lipase, trigs, Tbili normal.  Denies EtOH use.  Of note, does have a hx of metastatic melanoma with mets to pancreas, s/p 30 infusions of Pembrolizumab in July 2018.Sherald Hess TF at 20  Plan Supportive care Increase TF to goal PO based on swallow eval    Best practice (evaluated daily)  Diet: Tube feeds Pain/Anxiety/Delirium protocol (if indicated): NA VAP protocol (if indicated): NA DVT prophylaxis: heparin GI prophylaxis: PPI  Glucose control: SSI  Mobility: BR Disposition:ICU  More alert today. Remains confused to time and date. Knows he is in the hospital, and knows self and RN.   Goals of Care:  Last date of multidisciplinary goals of care discussion: Spoke with wife on 1/8 Family and staff present: NA Summary of discussion: continue full support  Follow up goals of care discussion due: in 7 days  Code Status: FULL   The patient is critically ill with multiple organ system failure and requires high complexity decision making for assessment and support, frequent evaluation and titration of therapies, advanced monitoring, review of radiographic studies and interpretation of complex data.   Critical Care Time devoted to patient care services, exclusive of separately billable procedures, described in this note is 37 minutes.   Magdalen Spatz, MSN, AGACNP-BC [place] for personal pager  10/24/2020, 7:47 AM

## 2020-10-24 NOTE — Progress Notes (Cosign Needed)
I have updated patient's family ( Daughter and wife)  in full regarding patient status and plan of care. All questions were answered.   Magdalen Spatz, MSN, AGACNP-BC [place] for personal pager 10/24/2020  2:22 PM

## 2020-10-24 NOTE — Progress Notes (Signed)
Nutrition Follow-up  DOCUMENTATION CODES:   Not applicable  INTERVENTION:   Tube Feeding via Cortrak:  Continue TF until pt demonstrating tolerance of solid diet and able to take >60% of needs by po route Goal rate: Vital AF 1.2 at 65 ml/hr Pro-Source TF 45 mL BID Provides 139 g of protein, 1952 kcals, 1264 mL of free water Meets 100% estimated calorie and protein needs  Ensure Enlive po BID, each supplement provides 350 kcal and 20 grams of protein  Continue B complex with C  NUTRITION DIAGNOSIS:   Inadequate oral intake related to acute illness as evidenced by NPO status.  Being addressed via TF, diet advancement  GOAL:   Patient will meet greater than or equal to 90% of their needs  Progressing  MONITOR:   TF tolerance,Labs,Weight trends,Diet advancement,Skin  REASON FOR ASSESSMENT:   Rounds (CRRT, plan for Cortrak)    ASSESSMENT:   87 yo male admitted with acute diarrhea on 1/1 with diagnosis of COVID and acute dehydration and subsequently found to have aspiration pneumonia. PMH includes metastatic melanoma with mets to pancreas (follwed at [facility]), B12 deficiency, systolic cardiomyopathy  1/4- CRRT initiated 1/5- cortrak placed (tip of tube in stomach), TF initiated 1/6- s/p BSE- advanced to dysphagia 3 diet with nectar thick liquids 1/7- CT of abdomen of pelvis revealed large amount of inflammatory changes arounf the pancreas concerning for severe acute pancreatitis 1/9- TF held due to concern for ileus 1/10 Trickle TF resumed  Pt remains on CRRT, possible transition to iHD Sitting up in chair today, mentating better. Plan to resume po diet  Vital AF 1.2  increased from 20 ml/hr to 40 ml/hr this AM No recorded po intake yet  Current wt 96 kg. Net negative 2.5 L  Labs: reviewed Meds: ss novolog, B complex with C  Diet Order:   Diet Order            DIET DYS 3 Room service appropriate? Yes; Fluid consistency: Nectar Thick  Diet effective now                  EDUCATION NEEDS:   Not appropriate for education at this time  Skin:  Skin Assessment: Reviewed RN Assessment  Last BM:  1/11  Height:   Ht Readings from Last 1 Encounters:  10/17/20 5\' 8"  (1.727 m)    Weight:   Wt Readings from Last 1 Encounters:  10/24/20 95.9 kg   BMI:  Body mass index is 32.15 kg/m.  Estimated Nutritional Needs:   Kcal:  1900-2300 kcals  Protein:  115-140 g  Fluid:  >/= 2L   [place] Passey MS, RDN, LDN, CNSC Registered Dietitian III Clinical Nutrition RD Pager and On-Call Pager Number Located in [place]

## 2020-10-24 NOTE — Progress Notes (Signed)
Physical Therapy Treatment Patient Details Name: Raymond Hurley. MRN: [id number] DOB: Jan 31, 1931 Today's Date: 10/24/2020    History of Present Illness Pt is a 87 y.o. male admitted 1/1 with hypotension due to diarrhea with Covid 19 (vaccinated and boosted). 1/3: increased confusion with Aspiration PNA, multiple organ failure, pancreatitis. 1/4 AFib with RVR with tranfer to ICU and CRRT initiated. PMhx: metastatic melanoma with pancreatic head mass, A fib, CHF (EF 45% as of 08/2019)    PT Comments    Pt pleasant and very eager to get OOB with pt stating surprise at his ability to actually get to chair today. Pt with improved standing ability but limited by dizziness with VSS and CRRT management as machine alarming in standing. Pt educated for HEP, progression and continued need for OOB as able to tolerate. Will continue to work toward increased time in standing and stepping as CRRT and medical status allow.  HR 89 SpO2 98% on 2L    Follow Up Recommendations  SNF;Supervision/Assistance - 24 hour     Equipment Recommendations  Rolling walker with 5" wheels    Recommendations for Other Services       Precautions / Restrictions Precautions Precautions: Fall;Other (comment) Precaution Comments: CRRT    Mobility  Bed Mobility Overal bed mobility: Needs Assistance Bed Mobility: Supine to Sit     Supine to sit: HOB elevated;Mod assist     General bed mobility comments: cues for sequence with assist to initiate and move legs toward EOB as well as elevate trunk with HOB 30 degrees, increased time  Transfers Overall transfer level: Needs assistance   Transfers: Sit to/from Stand;Stand Pivot Transfers Sit to Stand: Min assist;+2 safety/equipment         General transfer comment: pt able to stand with min +2 assist from bed and from recliner (2 trials) with cues for hand placement, guarding at knees and bil UE support on therapist. RN present to monitor  CRRT  Ambulation/Gait             General Gait Details: not yet able   Stairs             Wheelchair Mobility    Modified Rankin (Stroke Patients Only)       Balance Overall balance assessment: Needs assistance Sitting-balance support: No upper extremity supported;Feet supported Sitting balance-Leahy Scale: Fair Sitting balance - Comments: EOb with guarding for lines   Standing balance support: Bilateral upper extremity supported;Single extremity supported Standing balance-Leahy Scale: Poor Standing balance comment: single and bil UE support in standing                            Cognition Arousal/Alertness: Awake/alert Behavior During Therapy: Flat affect Overall Cognitive Status: Impaired/Different from baseline Area of Impairment: Orientation                 Orientation Level: Time Current Attention Level: Sustained Memory: Decreased short-term memory   Safety/Judgement: Decreased awareness of deficits;Decreased awareness of safety   Problem Solving: Slow processing;Requires verbal cues        Exercises General Exercises - Lower Extremity Long Arc Quad: AROM;Both;Seated;10 reps Hip Flexion/Marching: AAROM;Both;Seated;10 reps    General Comments        Pertinent Vitals/Pain Pain Assessment: No/denies pain    Home Living Family/patient expects to be discharged to:: Private residence  Prior Function            PT Goals (current goals can now be found in the care plan section) Progress towards PT goals: Progressing toward goals    Frequency    Min 3X/week      PT Plan Current plan remains appropriate    Co-evaluation              AM-PAC PT "6 Clicks" Mobility   Outcome Measure  Help needed turning from your back to your side while in a flat bed without using bedrails?: A Little Help needed moving from lying on your back to sitting on the side of a flat bed without using  bedrails?: A Lot Help needed moving to and from a bed to a chair (including a wheelchair)?: A Little Help needed standing up from a chair using your arms (e.g., wheelchair or bedside chair)?: A Little Help needed to walk in hospital room?: A Lot Help needed climbing 3-5 steps with a railing? : Total 6 Click Score: 14    End of Session Equipment Utilized During Treatment: Oxygen;Gait belt Activity Tolerance: Patient tolerated treatment well Patient left: in chair;with call bell/phone within reach;with chair alarm set;with nursing/sitter in room Nurse Communication: Mobility status PT Visit Diagnosis: Other abnormalities of gait and mobility (R26.89);Difficulty in walking, not elsewhere classified (R26.2);Muscle weakness (generalized) (M62.81)     Time: 2080-2233 PT Time Calculation (min) (ACUTE ONLY): 28 min  Charges:  $Therapeutic Exercise: 8-22 mins $Therapeutic Activity: 8-22 mins                     Gianny Sabino P, PT Acute Rehabilitation Services Pager: [phone] Office: [phone]    Shenika Quint B Annaliah Rivenbark 10/24/2020, 1:44 PM

## 2020-10-24 NOTE — Progress Notes (Signed)
  Speech Language Pathology Treatment: Dysphagia  Patient Details Name: Raymond Hurley. MRN: [id number] DOB: 02/06/1931 Today's Date: 10/24/2020 Time: 2409-7353 SLP Time Calculation (min) (ACUTE ONLY): 25 min  Assessment / Plan / Recommendation Clinical Impression  Pt had a few days NPO over the weekend due to ileus, but is now cleared to resume PO and SLP asked to return> pt up in chair, had some immediate coughign with ice and water and then expectoration of a large mucous plug. Afterwards, pt tolerated puree, nectar and mech soft solids as well as in prior session with no signs of aspiration. Given tenuous medical status, question of aspiration pna and prior signs of aspiration with thin liquids, will resume nectar for now with mech soft solids. May consider MBS after pt comes off of COVID precautions.   HPI HPI: Pt is an 87 y.o. male with medical history significant for atrial fibrillation on chronic anticoagulation, systolic CHF, melanoma, and vitamin B12 deficiency who presented after being found to be acutely altered with fever four days prior to admission. CXR 1/2: Stable small left pleural effusion. Left retrocardiac opacity with increased volume loss, indicating left lower lobe atelectasis with possible component of pneumonia. Found to have [place]. MRI brain negative.      SLP Plan  Continue with current plan of care       Recommendations  Diet recommendations: Dysphagia 3 (mechanical soft);Nectar-thick liquid Liquids provided via: Cup;Straw Medication Administration: Whole meds with puree Supervision: Staff to assist with self feeding Compensations: Slow rate;Small sips/bites Postural Changes and/or Swallow Maneuvers: Seated upright 90 degrees                Oral Care Recommendations: Oral care BID Follow up Recommendations: Skilled Nursing facility Plan: Continue with current plan of care       Raymond Hurley Raymond [place]land, MA Raymond Hurley Pager [phone] Office [phone]  Raymond Hurley 10/24/2020, 11:59 AM

## 2020-10-24 NOTE — Progress Notes (Signed)
K+3.6 reported to Dr Candiss Norse

## 2020-10-24 NOTE — Progress Notes (Signed)
[facility] Progress Note    Assessment/ Plan:   1. Acute renal failure - baseline cr 1.2 in June 2021, now with severe AKI in the setting of multiple insults including COVID, diarrhea, and pancreatitis (also was taking entresto).  Severe AKI with Cr up to 8.6, falling UOP CV started 1/4.  1. Maintain all CRRT fluid with 2K. Shortages present, unable to do 4K bags, discussed with pharmacy. Will replete prn via IV. 2. If remains hemodynamically stable, can likely transition to IHD  2. Acute pancreatitis: Noted on CT scan 10/16/20 and again on 1/7. 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  Had a hospitalization at [facility] for pancreatitis 2016 when peripancreatic lymph node leading to dx first discovered. Mgmt per primary.   3. Hypocalcemia: multifactorial. Corrects to near normal when accounting for albumin. No calcium needed but can replete prn  4. Acute hypoxemic RF: likely aspiration PNA +/- COVID pneumonitis, vent mgmt per primary  5. Shock: high WBC count, w/u per primary team. Possible ilacus abscess on CT from 1/7. Off levophed 1/11. Mero, vanc   6. COVID-19 infection: vaccinated and boosted- treatment per primary  7. Acute encephalopathy: multifactorial, likely COVID + acute illness. CTM  8.  Chronic systolic CHF: EF 10/19/08 on TTE 60-65% with no WMA. Fluid removal as above  9.  Afib: hep and amiodarone gtts  10.  Dispo: critically ill and remains in the ICU    Subjective:   Due to the COVID pandemic, in attempts to limit transmission and over-utilization of resources (e.g. PPE), the patient was seen virtually today by means of chart review, discussion with staff, and virtual discussion with patient as needed.  Continues on CRRT. Pressors are now off.   Objective:   BP (!) 122/96   Pulse 78   Temp (!) 97.2 F (36.2 C) (Axillary)   Resp 17   Ht 5\' 8"  (1.727 m)   Wt 95.9 kg   SpO2 96%    BMI 32.15 kg/m   Intake/Output Summary (Last 24 hours) at 10/24/2020 1259 Last data filed at 10/24/2020 1200 Gross per 24 hour  Intake 1860.42 ml  Output 4028 ml  Net -2167.58 ml   Weight change: -3.3 kg  Physical Exam:    Not examined today  Imaging: No results found.  Labs: BMET Recent Labs  Lab 10/21/20 0327 10/21/20 1838 10/22/20 0234 10/22/20 1411 10/23/20 0233 10/23/20 1619 10/24/20 0612  NA 135 132* 133* 134* 133* 134* 137  K 4.3 5.7* 4.5 4.2 5.4* 3.8 3.6  CL 100 100 98 99 98 97* 101  CO2 25 17* 24 26 21* 26 25  GLUCOSE 147* 194* 146* 163* 94 143* 127*  BUN 28* 32* 31* 29* 29* 26* 27*  CREATININE 1.77* 1.61* 1.58* 1.65* 1.50* 1.74* 1.63*  CALCIUM 7.5* 7.4* 7.6* 7.7* 7.7* 7.5* 7.7*  PHOS 2.2* 3.6 2.9 3.3 3.4 3.6 3.4   CBC Recent Labs  Lab 10/18/20 0352 10/19/20 0358 10/20/20 0715 10/21/20 0328 10/22/20 0234 10/23/20 0232 10/24/20 0612  WBC 43.8* 46.0*   < > 78.9* 85.4* 85.8* 71.9*  NEUTROABS 29.1* 38.6*  --   --   --   --   --   HGB 8.6* 8.2*   < > 8.0* 7.9* 7.3* 7.1*  HCT 24.4* 22.9*   < > 24.5* 24.3* 23.2* 21.9*  MCV 101.7* 100.9*   < > 109.4* 107.5* 111.5* 109.5*  PLT 162 168   < >  156 172 124* 170   < > = values in this interval not displayed.    Medications:    . acetaminophen (TYLENOL) oral liquid 160 mg/5 mL  650 mg Per Tube Q6H  . B-complex with vitamin C  1 tablet Per Tube Daily  . Chlorhexidine Gluconate Cloth  6 each Topical Daily  . feeding supplement (VITAL AF 1.2 CAL)  1,000 mL Per Tube Q24H  . Gerhardt's butt cream   Topical TID  . hydrocortisone sod succinate (SOLU-CORTEF) inj  100 mg Intravenous Q8H  . insulin aspart  3-9 Units Subcutaneous Q4H  . mouth rinse  15 mL Mouth Rinse BID  . phenol  1 spray Mouth/Throat Once  . sodium chloride flush  10-40 mL Intracatheter Q12H  . sodium chloride flush  3 mL Intravenous Q12H      Raymond Hurley  10/24/2020, 12:59 PM

## 2020-10-25 ENCOUNTER — Inpatient Hospital Stay (HOSPITAL_COMMUNITY): Payer: Medicare Other

## 2020-10-25 DIAGNOSIS — A419 Sepsis, unspecified organism: Secondary | ICD-10-CM | POA: Diagnosis not present

## 2020-10-25 DIAGNOSIS — R6520 Severe sepsis without septic shock: Secondary | ICD-10-CM | POA: Diagnosis not present

## 2020-10-25 LAB — COMPREHENSIVE METABOLIC PANEL
ALT: 53 U/L — ABNORMAL HIGH (ref 0–44)
AST: 47 U/L — ABNORMAL HIGH (ref 15–41)
Albumin: 2.5 g/dL — ABNORMAL LOW (ref 3.5–5.0)
Alkaline Phosphatase: 225 U/L — ABNORMAL HIGH (ref 38–126)
Anion gap: 10 (ref 5–15)
BUN: 28 mg/dL — ABNORMAL HIGH (ref 8–23)
CO2: 25 mmol/L (ref 22–32)
Calcium: 8.1 mg/dL — ABNORMAL LOW (ref 8.9–10.3)
Chloride: 101 mmol/L (ref 98–111)
Creatinine, Ser: 1.64 mg/dL — ABNORMAL HIGH (ref 0.61–1.24)
GFR, Estimated: 40 mL/min — ABNORMAL LOW (ref >60)
Glucose, Bld: 157 mg/dL — ABNORMAL HIGH (ref 70–99)
Potassium: 3.8 mmol/L (ref 3.5–5.1)
Sodium: 136 mmol/L (ref 135–145)
Total Bilirubin: 0.7 mg/dL (ref 0.3–1.2)
Total Protein: 5.3 g/dL — ABNORMAL LOW (ref 6.5–8.1)

## 2020-10-25 LAB — CULTURE, BLOOD (ROUTINE X 2)
Culture: NO GROWTH
Culture: NO GROWTH
Special Requests: ADEQUATE

## 2020-10-25 LAB — PHOSPHORUS: Phosphorus: 2.1 mg/dL — ABNORMAL LOW (ref 2.5–4.6)

## 2020-10-25 LAB — RENAL FUNCTION PANEL
Albumin: 2.6 g/dL — ABNORMAL LOW (ref 3.5–5.0)
Anion gap: 11 (ref 5–15)
BUN: 30 mg/dL — ABNORMAL HIGH (ref 8–23)
CO2: 25 mmol/L (ref 22–32)
Calcium: 8.2 mg/dL — ABNORMAL LOW (ref 8.9–10.3)
Chloride: 101 mmol/L (ref 98–111)
Creatinine, Ser: 1.71 mg/dL — ABNORMAL HIGH (ref 0.61–1.24)
GFR, Estimated: 38 mL/min — ABNORMAL LOW (ref >60)
Glucose, Bld: 127 mg/dL — ABNORMAL HIGH (ref 70–99)
Phosphorus: 2.2 mg/dL — ABNORMAL LOW (ref 2.5–4.6)
Potassium: 3.9 mmol/L (ref 3.5–5.1)
Sodium: 137 mmol/L (ref 135–145)

## 2020-10-25 LAB — GLUCOSE, CAPILLARY
Glucose-Capillary: 138 mg/dL — ABNORMAL HIGH (ref 70–99)
Glucose-Capillary: 148 mg/dL — ABNORMAL HIGH (ref 70–99)
Glucose-Capillary: 163 mg/dL — ABNORMAL HIGH (ref 70–99)
Glucose-Capillary: 112 mg/dL — ABNORMAL HIGH (ref 70–99)
Glucose-Capillary: 153 mg/dL — ABNORMAL HIGH (ref 70–99)
Glucose-Capillary: 106 mg/dL — ABNORMAL HIGH (ref 70–99)

## 2020-10-25 LAB — CBC
HCT: 21.6 % — ABNORMAL LOW (ref 39.0–52.0)
Hemoglobin: 6.9 g/dL — CL (ref 13.0–17.0)
MCH: 35.6 pg — ABNORMAL HIGH (ref 26.0–34.0)
MCHC: 31.9 g/dL (ref 30.0–36.0)
MCV: 111.3 fL — ABNORMAL HIGH (ref 80.0–100.0)
Platelets: 169 10*3/uL (ref 150–400)
RBC: 1.94 MIL/uL — ABNORMAL LOW (ref 4.22–5.81)
RDW: 16.1 % — ABNORMAL HIGH (ref 11.5–15.5)
WBC: 72.7 10*3/uL (ref 4.0–10.5)
nRBC: 0.7 % — ABNORMAL HIGH (ref 0.0–0.2)

## 2020-10-25 LAB — PREPARE RBC (CROSSMATCH)

## 2020-10-25 LAB — HEMOGLOBIN AND HEMATOCRIT, BLOOD
HCT: 22.6 % — ABNORMAL LOW (ref 39.0–52.0)
Hemoglobin: 7.6 g/dL — ABNORMAL LOW (ref 13.0–17.0)

## 2020-10-25 LAB — HEPARIN LEVEL (UNFRACTIONATED): Heparin Unfractionated: 0.34 IU/mL (ref 0.30–0.70)

## 2020-10-25 LAB — MAGNESIUM: Magnesium: 2.5 mg/dL — ABNORMAL HIGH (ref 1.7–2.4)

## 2020-10-25 MED ORDER — SODIUM CHLORIDE 0.9% IV SOLUTION: Freq: Once | INTRAVENOUS | Status: AC

## 2020-10-25 MED ORDER — AMIODARONE HCL 200 MG PO TABS: 400.0000 mg | ORAL_TABLET | Freq: Two times a day (BID) | ORAL | Status: DC

## 2020-10-25 MED ORDER — AMIODARONE HCL 200 MG PO TABS
400.0000 mg | ORAL_TABLET | Freq: Two times a day (BID) | ORAL | Status: DC
  Administered 2020-10-25: 400 mg
  Filled 2020-10-25: qty 2

## 2020-10-25 MED ORDER — AMIODARONE HCL 200 MG PO TABS
400.0000 mg | ORAL_TABLET | Freq: Two times a day (BID) | ORAL | Status: DC
  Administered 2020-10-25 – 2020-10-27 (×5): 400 mg via ORAL
  Filled 2020-10-25 (×5): qty 2

## 2020-10-25 MED ORDER — PREDNISONE 20 MG PO TABS
40.0000 mg | ORAL_TABLET | Freq: Every day | ORAL | Status: DC
  Administered 2020-10-26 – 2020-10-27 (×2): 40 mg via ORAL
  Filled 2020-10-25 (×2): qty 2

## 2020-10-25 MED ORDER — HEPARIN (PORCINE) 25000 UT/250ML-% IV SOLN
1000.0000 [IU]/h | INTRAVENOUS | Status: DC
  Administered 2020-10-26: 22:00:00 700 [IU]/h via INTRAVENOUS
  Administered 2020-10-28: 800 [IU]/h via INTRAVENOUS
  Filled 2020-10-25 (×6): qty 250

## 2020-10-25 NOTE — Progress Notes (Signed)
[facility] Progress Note Patient Name: Raymond Hurley. DOB: [birth year] MRN: [id number]   Date of Service  10/25/2020  HPI/Events of Note  Anemia - Hgb = 6.9.  eICU Interventions  Will transfuse 1 unit PRBC now.      Intervention Category Major Interventions: Other:  Lysle Dingwall 10/25/2020, 6:30 AM

## 2020-10-25 NOTE — Progress Notes (Addendum)
Occupational Therapy Treatment Patient Details Name: Raymond Hurley. MRN: [id number] DOB: 1931-04-05 Today's Date: 10/25/2020    History of present illness Pt is a 87 y.o. male admitted 1/1 with hypotension due to diarrhea with Covid 19 (vaccinated and boosted). 1/3: increased confusion with Aspiration PNA, multiple organ failure, pancreatitis. 1/4 AFib with RVR with tranfer to ICU and CRRT initiated. PMhx: metastatic melanoma with pancreatic head mass, A fib, CHF (EF 45% as of 08/2019)   OT comments  Pt presents supine in bed pleasant and very willing to work with therapy. Pt tolerating bed mobility and stand pivot to recliner, requiring up to maxA for bed mobility and modA for functional transfers (RN present and assisting with intermittent +2 physical assist and to assist with CRRT lines/ general safety). Pt engaging in additional therapeutic activity while seated in recliner for continued strengthening/endurance. Pt fatigued with activity but tolerates session well, happy to be OOB. VSS throughout on 0.5L O2. Have updated d/c recommendations, given pt's current status he will likely require post acute therapies prior to return home. Acute OT to follow.    Follow Up Recommendations  SNF;Supervision/Assistance - 24 hour    Equipment Recommendations  Other (comment);3 in 1 bedside commode (TBD)          Precautions / Restrictions Precautions Precautions: Fall;Other (comment) Precaution Comments: CRRT       Mobility Bed Mobility Overal bed mobility: Needs Assistance Bed Mobility: Supine to Sit     Supine to sit: HOB elevated;Max assist     General bed mobility comments: cues for sequence with assist to initiate and move legs toward EOB as well as elevate trunk  Transfers Overall transfer level: Needs assistance   Transfers: Sit to/from Stand;Stand Pivot Transfers Sit to Stand: Min assist;+2 safety/equipment;+2 physical assistance Stand pivot transfers: Mod assist;+2  safety/equipment       General transfer comment: pt able to stand with min +2 assist from EOB using bil UE support via face to face. pt requiring max verbal/tactile cues to initiate taking small steps and to facilitate turning towards recliner. RN present for additional assist and to monitor CRRT    Balance Overall balance assessment: Needs assistance Sitting-balance support: No upper extremity supported;Feet supported Sitting balance-Leahy Scale: Fair Sitting balance - Comments: minguard for lines and safety   Standing balance support: Bilateral upper extremity supported Standing balance-Leahy Scale: Poor Standing balance comment: reliant on external assist                           ADL either performed or assessed with clinical judgement   ADL Overall ADL's : Needs assistance/impaired                             Toileting- Clothing Manipulation and Hygiene: Maximal assistance;+2 for physical assistance;+2 for safety/equipment;Sit to/from stand Toileting - Clothing Manipulation Details (indicate cue type and reason): RN assisting with pericare in standing while OT assists with standing balance     Functional mobility during ADLs: +2 for physical assistance;+2 for safety/equipment;Moderate assistance;Minimal assistance (stand pivot)                 Cognition Arousal/Alertness: Awake/alert Behavior During Therapy: Flat affect Overall Cognitive Status: Impaired/Different from baseline                     Current Attention Level: Sustained Memory: Decreased short-term memory  Safety/Judgement: Decreased awareness of deficits;Decreased awareness of safety   Problem Solving: Slow processing;Requires verbal cues;Difficulty sequencing;Requires tactile cues General Comments: multimodal cues for initiating/sequencing mobility tasks today        Exercises Exercises: General Lower Extremity;Other exercises General Exercises - Lower  Extremity Long Arc Quad: AROM;Both;Seated;10 reps Hip Flexion/Marching: AROM;Both;10 reps;Seated Other Exercises Other Exercises: seated reaching bil UE, x10   Shoulder Instructions       General Comments      Pertinent Vitals/ Pain       Pain Assessment: Faces Faces Pain Scale: Hurts little more Pain Location: back Pain Descriptors / Indicators: Discomfort Pain Intervention(s): Monitored during session;Repositioned  Home Living                                          Prior Functioning/Environment              Frequency  Min 2X/week        Progress Toward Goals  OT Goals(current goals can now be found in the care plan section)  Progress towards OT goals: Progressing toward goals  Acute Rehab OT Goals Patient Stated Goal: get more comfortable, OT Goal Formulation: With patient Time For Goal Achievement: 10/30/20 Potential to Achieve Goals: Good ADL Goals Pt Will Perform Lower Body Dressing: with min guard assist Pt Will Transfer to Toilet: with min guard assist;bedside commode Pt/caregiver will Perform Home Exercise Program: Increased strength;Both right and left upper extremity;With Supervision Additional ADL Goal #1: Pt will tolerating x5 mins of OOB ADL with supervisionA and minimal verbal cues for proper technique. Additional ADL Goal #2: Pt will state/utilize 3 energy conservation techniques to increase ability to care for self and perform ADL functional mobility.  Plan Discharge plan needs to be updated;Frequency remains appropriate    Co-evaluation                 AM-PAC OT "6 Clicks" Daily Activity     Outcome Measure   Help from another person eating meals?: A Little Help from another person taking care of personal grooming?: A Little Help from another person toileting, which includes using toliet, bedpan, or urinal?: A Lot Help from another person bathing (including washing, rinsing, drying)?: A Lot Help from another  person to put on and taking off regular upper body clothing?: A Little Help from another person to put on and taking off regular lower body clothing?: A Lot 6 Click Score: 15    End of Session Equipment Utilized During Treatment: Oxygen  OT Visit Diagnosis: Unsteadiness on feet (R26.81);Muscle weakness (generalized) (M62.81);Other symptoms and signs involving cognitive function   Activity Tolerance Patient tolerated treatment well   Patient Left in chair;with call bell/phone within reach;with nursing/sitter in room;with family/visitor present   Nurse Communication Mobility status        Time: 7341-9379 OT Time Calculation (min): 25 min  Charges: OT General Charges $OT Visit: 1 Visit OT Treatments $Self Care/Home Management : 8-22 mins $Therapeutic Activity: 8-22 mins  Lou Cal, OT Acute Rehabilitation Services Pager [phone] Office [phone]    [name] 10/25/2020, 1:13 PM

## 2020-10-25 NOTE — Progress Notes (Signed)
CRITICAL VALUE ALERT  Critical Value:  Hgb 6.9  Date & Time Notied:  10/25/2020 @ 06:20  Provider Notified: Quentin Ore, S. MD  Orders Received/Actions taken: 1 unit RBCs to infuse.

## 2020-10-25 NOTE — Progress Notes (Signed)
PCCM Family communication  Daughter updated at bedside this afternoon. All questions answered     Eliseo Gum MSN, AGACNP-BC [place] 10/25/2020, 5:02 PM

## 2020-10-25 NOTE — Progress Notes (Signed)
[facility] Progress Note    Assessment/ Plan:   1. Acute renal failure - baseline cr 1.2 in June 2021, now with severe AKI in the setting of multiple insults including COVID, diarrhea, and pancreatitis (also was taking entresto).  Severe AKI with Cr up to 8.6, falling UOP CV started 1/4.  1. Continue with CRRT, let filter expire or clot (whichever comes first), plan is to transition to IHD. Timing of IHD will be dictated by when CRRT is off  2. Acute pancreatitis: Noted on CT scan 10/16/20 and again on 1/7. 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  Had a hospitalization at [facility] for pancreatitis 2016 when peripancreatic lymph node leading to dx first discovered. Mgmt per primary.   3. Hypocalcemia: multifactorial. Corrects to near normal when accounting for albumin. No calcium needed but can replete prn  4. Acute hypoxemic RF: likely aspiration PNA +/- COVID pneumonitis, vent mgmt per primary  5. Shock: high WBC count, w/u per primary team. Possible ilacus abscess on CT from 1/7. Off levophed 1/11. Mero, vanc   6. COVID-19 infection: vaccinated and boosted- treatment per primary  7. Acute encephalopathy: multifactorial, likely COVID + acute illness. CTM  8.  Chronic systolic CHF: EF 04/19/15 on TTE 60-65% with no WMA. Fluid removal as above  9.  Afib: hep and amiodarone gtts  Recommendations conveyed to primary service.    Subjective:   Due to the COVID pandemic, in attempts to limit transmission and over-utilization of resources (e.g. PPE), the patient was seen virtually today by means of chart review, discussion with staff, and virtual discussion with patient as needed.  Continues on CRRT. Remains hemodynamically stable off pressors. Remains anuric. Net neg ~2.2L in the last 24 hours.   Objective:   BP (!) 148/108   Pulse 80   Temp (!) 97.5 F (36.4 C) (Oral)   Resp (!) 21   Ht 5\' 8"   (1.727 m)   Wt 93.3 kg   SpO2 100%   BMI 31.27 kg/m   Intake/Output Summary (Last 24 hours) at 10/25/2020 1310 Last data filed at 10/25/2020 1300 Gross per 24 hour  Intake 3018.29 ml  Output 4790 ml  Net -1771.71 ml   Weight change: -2.6 kg  Physical Exam:    Not examined today  Imaging: DG Abd 1 View  Result Date: 10/25/2020 CLINICAL DATA:  Pneumonia due to COVID-19 EXAM: ABDOMEN - 1 VIEW COMPARISON:  Abdominal radiograph 10/22/2020, chest radiograph 10/25/2020 FINDINGS: Transesophageal feeding tube tip terminates near the gastric antrum/duodenal bulb. Positioning is quite similar to comparison exam. Telemetry leads overlie the lower chest. Opacities in the lung bases, better detailed on chest radiography. Slight paucity of abdominal bowel gas is nonspecific but without features of high-grade bowel obstruction. No suspicious calcifications in the included portions of the abdomen and pelvis. Few phleboliths stent noted in the low pelvis. Degenerative changes in the spine, hips and pelvis with mild dextrocurvature of the lumbar levels. IMPRESSION: 1. Transesophageal feeding tube tip terminates near the gastric antrum/duodenal bulb. Positioning is similar to comparison exam. Electronically Signed   By: Lovena Le M.D.   On: 10/25/2020 05:51   DG CHEST PORT 1 VIEW  Result Date: 10/25/2020 CLINICAL DATA:  COVID.  Pneumonia. EXAM: PORTABLE CHEST 1 VIEW COMPARISON:  10/22/2020.  CT 10/20/2020. FINDINGS: Feeding tube and dual-lumen right IJ catheter stable position. Mediastinum stable. Stable cardiomegaly. Bilateral interstitial infiltrates again noted without significant  interim change. No pleural effusion or pneumothorax. Stable biapical pleural thickening most consistent scarring. Degenerative change thoracic spine. IMPRESSION: 1. Feeding tube and dual-lumen right IJ catheter in stable position. 2. Stable cardiomegaly. 3. Bilateral interstitial infiltrates again noted without significant interim  change. Electronically Signed   ByMarcello Moores  Register   On: 10/25/2020 05:50    Labs: BMET Recent Labs  Lab 10/22/20 0234 10/22/20 1411 10/23/20 0233 10/23/20 1619 10/24/20 0612 10/24/20 1624 10/25/20 0342  NA 133* 134* 133* 134* 137 136 136  K 4.5 4.2 5.4* 3.8 3.6 3.5 3.8  CL 98 99 98 97* 101 99 101  CO2 24 26 21* 26 25 25 25   GLUCOSE 146* 163* 94 143* 127* 133* 157*  BUN 31* 29* 29* 26* 27* 28* 28*  CREATININE 1.58* 1.65* 1.50* 1.74* 1.63* 1.70* 1.64*  CALCIUM 7.6* 7.7* 7.7* 7.5* 7.7* 7.9* 8.1*  PHOS 2.9 3.3 3.4 3.6 3.4 2.9 2.1*   CBC Recent Labs  Lab 10/19/20 0358 10/20/20 0715 10/22/20 0234 10/23/20 0232 10/24/20 0612 10/25/20 0342  WBC 46.0*   < > 85.4* 85.8* 71.9* 72.7*  NEUTROABS 38.6*  --   --   --   --   --   HGB 8.2*   < > 7.9* 7.3* 7.1* 6.9*  HCT 22.9*   < > 24.3* 23.2* 21.9* 21.6*  MCV 100.9*   < > 107.5* 111.5* 109.5* 111.3*  PLT 168   < > 172 124* 170 169   < > = values in this interval not displayed.    Medications:    . acetaminophen (TYLENOL) oral liquid 160 mg/5 mL  650 mg Oral Q6H  . amiodarone  400 mg Per Tube BID  . B-complex with vitamin C  1 tablet Oral Daily  . Chlorhexidine Gluconate Cloth  6 each Topical Daily  . feeding supplement  237 mL Oral BID BM  . Gerhardt's butt cream   Topical TID  . insulin aspart  3-9 Units Subcutaneous Q4H  . mouth rinse  15 mL Mouth Rinse BID  . phenol  1 spray Mouth/Throat Once  . [START ON 10/26/2020] predniSONE  40 mg Oral Q breakfast  . sodium chloride flush  10-40 mL Intracatheter Q12H  . sodium chloride flush  3 mL Intravenous Q12H  . triamcinolone 0.1 % cream : eucerin   Topical BID      Raymond Hurley  10/25/2020, 1:10 PM

## 2020-10-25 NOTE — Progress Notes (Signed)
[place] Progress Note Patient Name: Raymond Hurley. DOB: 1931-04-06 MRN: [id number]   Date of Service  10/25/2020  HPI/Events of Note  Frequent loose stools - Nursing request for a Flexiseal.  eICU Interventions  Plan: 1. Place Flexiseal.      Intervention Category Major Interventions: Other:  Lysle Dingwall 10/25/2020, 12:31 AM

## 2020-10-25 NOTE — Progress Notes (Addendum)
NAME:  Dawayne Ohair., MRN:  [id number], DOB:  09/11/1931, LOS: 11 ADMISSION DATE:  10/14/2020, CONSULTATION DATE:  10/14/2020 REFERRING MD:  [place] - [facility], CHIEF COMPLAINT:  Hypotension.    HPI/course in hospital   87 year old man who is hypotensive in the context of acute diarrhea on 1/1 was admitted w/ dx of COVID and acute dehydration from acute diarrheal illness. Of note he was fully vaccinated and had received his booster.  Critical care was consulted given hypotension however this responded to IV hydration and supportive care. Hospital course from 1/1 through 1/3:  Subsequently found to have aspiration pneumonia,  And, worsening delirium, and multiple organ failure, on 1/3  had worsening work of breathing more confusion, and CT of abdomen showed asymptomatic pancreatitis.  Given advanced age and multiple organ dysfunction in addition to concern about further clinical decline critical care asked to reevaluate  Past Medical History:   Atrial fibrillation, melanoma, vitamin B12 deficiency.  Systolic cardiomyopathy. 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  Transferring to ICU. Started CRRT 1/7  Worsening leukocytosis.  CT abdomen pelvis with severe pancreatitis, possible iliopsoas abscess.  Deemed too unstable for intervention per surgery and interventional radiology 1/8 started pressors.  Antibiotics broadened to meropenem and vancomycin.  Made DNR 1/12 plan to stop CRRT when circuit clots off and then attempt iHD as hemodynamics have remained adequate off of NE   Consults:  PCCM, nephrology  Procedures:  Right IJ HD cath 1/4  Significant Diagnostic Tests:  1/7 CT a/p> inflammatory changes to suggest severe pancreatitis. No definite fluid collection to suggest pseudocyst. Sigmoid diverticulosis. R iliacus muscle with 4.6x2.3 cm density-- possible abscess   Micro Data:  Blood culture 1/1 > no  growth Blood culture 1/7 >  no growth  Antimicrobials:  Unasyn 1/2 >> 1/4 Cefepime 1/4 >> 1/7 Flagyl 1/6 >> 1/7  Meropenem 1/7 >>  Vancomycin 1/8 > 1/11 Zyvox 1/12>   Interim History / Subjective:  Hgb 6.9 overnight, received 1 PRBC Remains off levo WBC has plateaued -- 72   In NSR this morning, but going in and out of Afib  On 1LNC with SpO2 100%  Asking about his outpatient oncology plans   Objective   Blood pressure (!) 118/40, pulse 78, temperature 97.9 F (36.6 C), temperature source Axillary, resp. rate (!) 21, height 5\' 8"  (1.727 m), weight 93.3 kg, SpO2 100 %.        Intake/Output Summary (Last 24 hours) at 10/25/2020 0757 Last data filed at 10/25/2020 0700 Gross per 24 hour  Intake 2698.01 ml  Output 4973 ml  Net -2274.99 ml   Filed Weights   10/23/20 0325 10/24/20 0500 10/25/20 0600  Weight: 99.2 kg 95.9 kg 93.3 kg    Examination:  Gen:      Chronically ill frail elderly M, seated in bed on CRRT, NAD  HEENT:  NCAT pink mm. Scattered scabs to face anicteric sclera. Cortrak secure Lungs:   Symmetrical chest expansion, even unlabored respirations, CTAb  CV:         rrr s1s2 cap refill < 3  Abd:      Soft round non distended  Ext:    No obvious joint deformity no cyanosis or clubbing, non-pitting edema  Skin:       C/d/warm Neuro:  AAOx3 no focal deficits following commands   Assessment & Plan:   Acute metabolic encephalopathy  secondary to uremia, sepsis -- improving  Plan -delirium precautions -CRRT, abx, as below   Acute renal failure requiring RRT  Hypophosphatemia, mild Suspect all secondary to ATN after recent diarrheal illness and volume depletion Hypocalcemia corrects with albumin Plan: -nephro following  -Will transition off of CRRT to iHD when CRRT clots off --d/w nephrologist 1/12 -will replace low dose phos  Acute hypoxic respiratory failure 2/2 aspiration PNA, improved  COVID-19 infection  Plan -wean O2 as tolerated -supportive  care  Septic shock, improved shock -has a possible abscess in iliacus muscle -- d/w IR, CCS on 1/7, no surgery or drain indicated Plan -will de-escalate stress dose steroids 1/12 -cont mero, zyvox   Acute pancreatitis  Plan -Increase enteral nutr  -cont to trend WBC, fever curve  Afib -- currently in sinus, but going in and out of Afib Systolic cardiomyopathy Echo 1/3 with EF 55 - 60% Plan -Cont hep gtt per pharm with fq conversions in and out of AFib -amio -- will change from gtt to PO  -mag goal  2, K goal 4   Acute anemia requiring transfusion  -1 PRBC 1/12 for Hgb 6.9. Think this is largely iatrogenic from blood draws, losses in CRRT circuit. Likely component of critical illness anemia, underlying chronic anemia P -goal hgb> 7 -if obvious bleeding or large hgb drop, dc hep gtt.    Best practice (evaluated daily)  Diet: Tube feeds + Dys 3 Pain/Anxiety/Delirium protocol (if indicated): NA VAP protocol (if indicated): NA DVT prophylaxis: heparin gtt per pharm  GI prophylaxis: PPI  Glucose control: SSI  Mobility: BR Disposition:ICU.    Goals of Care:  Last date of multidisciplinary goals of care discussion:  Family and staff present:  Summary of discussion: continue full support  Follow up goals of care discussion due:  Family updated: 1/11 --pending family updated 1/12, pt updated Code Status: DNR  CRITICAL CARE Performed by: Cristal Generous  Total critical care time: 40 minutes  Critical care time was exclusive of separately billable procedures and treating other patients. Critical care was necessary to treat or prevent imminent or life-threatening deterioration.  Critical care was time spent personally by me on the following activities: development of treatment plan with patient and/or surrogate as well as nursing, discussions with consultants, evaluation of patient's response to treatment, examination of patient, obtaining history from patient or surrogate,  ordering and performing treatments and interventions, ordering and review of laboratory studies, ordering and review of radiographic studies, pulse oximetry and re-evaluation of patient's condition.  Eliseo Gum MSN, AGACNP-BC [place] [phone] If no answer, [phone] 10/25/2020, 7:57 AM

## 2020-10-25 NOTE — Progress Notes (Signed)
ANTICOAGULATION CONSULT NOTE - Follow Up Consult  Pharmacy Consult for heparin Indication: atrial fibrillation  Labs: Recent Labs    10/22/20 1411 10/22/20 1411 10/23/20 0232 10/23/20 0233 10/24/20 0612 10/24/20 1624 10/24/20 2000 10/25/20 0342  HGB  --    < > 7.3*  --  7.1*  --   --  6.9*  HCT  --   --  23.2*  --  21.9*  --   --  21.6*  PLT  --   --  124*  --  170  --   --  169  APTT 124*  --  75*  --   --   --   --   --   HEPARINUNFRC 0.82*  --  0.57  --  0.19*  --  0.32 0.34  CREATININE 1.65*  --   --    < > 1.63* 1.70*  --  1.64*   < > = values in this interval not displayed.    Assessment: 87yo male therapeutic on heparin, level at 0.34. Hgb low to 6.9, received 1 unit this morning. Hgb has been trending down slightly, likely dilutional with fluids and CRRT. Has been in and out of sinus rhythm, so continuing anticoagulation. No overt bleeding or infusion issues.   Goal of Therapy:  Heparin level 0.3 - 0.7   Plan:  Continue heparin at 700 units/hr.  Daily heparin level and CBC  Norina Buzzard, PharmD PGY1 Pharmacy Resident 10/25/2020 10:25 AM

## 2020-10-26 DIAGNOSIS — R6520 Severe sepsis without septic shock: Secondary | ICD-10-CM | POA: Diagnosis not present

## 2020-10-26 DIAGNOSIS — A419 Sepsis, unspecified organism: Secondary | ICD-10-CM | POA: Diagnosis not present

## 2020-10-26 LAB — RENAL FUNCTION PANEL
Albumin: 2.4 g/dL — ABNORMAL LOW (ref 3.5–5.0)
Albumin: 2.3 g/dL — ABNORMAL LOW (ref 3.5–5.0)
Anion gap: 11 (ref 5–15)
Anion gap: 13 (ref 5–15)
BUN: 32 mg/dL — ABNORMAL HIGH (ref 8–23)
BUN: 59 mg/dL — ABNORMAL HIGH (ref 8–23)
CO2: 24 mmol/L (ref 22–32)
CO2: 21 mmol/L — ABNORMAL LOW (ref 22–32)
Calcium: 8.1 mg/dL — ABNORMAL LOW (ref 8.9–10.3)
Calcium: 7.6 mg/dL — ABNORMAL LOW (ref 8.9–10.3)
Chloride: 101 mmol/L (ref 98–111)
Chloride: 100 mmol/L (ref 98–111)
Creatinine, Ser: 1.89 mg/dL — ABNORMAL HIGH (ref 0.61–1.24)
Creatinine, Ser: 3.3 mg/dL — ABNORMAL HIGH (ref 0.61–1.24)
GFR, Estimated: 34 mL/min — ABNORMAL LOW (ref >60)
GFR, Estimated: 17 mL/min — ABNORMAL LOW (ref >60)
Glucose, Bld: 111 mg/dL — ABNORMAL HIGH (ref 70–99)
Glucose, Bld: 199 mg/dL — ABNORMAL HIGH (ref 70–99)
Phosphorus: 2.1 mg/dL — ABNORMAL LOW (ref 2.5–4.6)
Phosphorus: 3.2 mg/dL (ref 2.5–4.6)
Potassium: 3.9 mmol/L (ref 3.5–5.1)
Potassium: 4.2 mmol/L (ref 3.5–5.1)
Sodium: 136 mmol/L (ref 135–145)
Sodium: 134 mmol/L — ABNORMAL LOW (ref 135–145)

## 2020-10-26 LAB — HEPARIN LEVEL (UNFRACTIONATED): Heparin Unfractionated: 0.43 IU/mL (ref 0.30–0.70)

## 2020-10-26 LAB — CBC
HCT: 22.7 % — ABNORMAL LOW (ref 39.0–52.0)
Hemoglobin: 7.7 g/dL — ABNORMAL LOW (ref 13.0–17.0)
MCH: 35.2 pg — ABNORMAL HIGH (ref 26.0–34.0)
MCHC: 33.9 g/dL (ref 30.0–36.0)
MCV: 103.7 fL — ABNORMAL HIGH (ref 80.0–100.0)
Platelets: 158 10*3/uL (ref 150–400)
RBC: 2.19 MIL/uL — ABNORMAL LOW (ref 4.22–5.81)
RDW: 20 % — ABNORMAL HIGH (ref 11.5–15.5)
WBC: 66.5 10*3/uL (ref 4.0–10.5)
nRBC: 1.1 % — ABNORMAL HIGH (ref 0.0–0.2)

## 2020-10-26 LAB — GLUCOSE, CAPILLARY
Glucose-Capillary: 118 mg/dL — ABNORMAL HIGH (ref 70–99)
Glucose-Capillary: 124 mg/dL — ABNORMAL HIGH (ref 70–99)
Glucose-Capillary: 132 mg/dL — ABNORMAL HIGH (ref 70–99)
Glucose-Capillary: 152 mg/dL — ABNORMAL HIGH (ref 70–99)
Glucose-Capillary: 170 mg/dL — ABNORMAL HIGH (ref 70–99)
Glucose-Capillary: 180 mg/dL — ABNORMAL HIGH (ref 70–99)

## 2020-10-26 LAB — MAGNESIUM: Magnesium: 2.6 mg/dL — ABNORMAL HIGH (ref 1.7–2.4)

## 2020-10-26 MED ORDER — SODIUM CHLORIDE 0.9 % IV SOLN
500.0000 mg | INTRAVENOUS | Status: DC
  Administered 2020-10-26 – 2020-10-30 (×5): 500 mg via INTRAVENOUS
  Filled 2020-10-26 (×9): qty 0.5

## 2020-10-26 NOTE — Progress Notes (Signed)
ANTICOAGULATION & ANTIBIOTIC CONSULT NOTE  Pharmacy Consult:  Heparin + Merrem Indication:  Afib + Right psoas abscess  Allergies  Allergen Reactions  . Povidone-Iodine Swelling  . Tape Swelling  . Bee Venom Rash  . Covid-19 Mrna Vacc (Moderna) Hives and Rash    Rash came after booster shot. Pt was fine with 1st and 2nd    Patient Measurements: Height: 5\' 8"  (172.7 cm) Weight: 91.5 kg (201 lb 11.5 oz) IBW/kg (Calculated) : 68.4 Heparin Dosing Weight: 87 kg  Vital Signs: Temp: 98 F (36.7 C) (01/13 0700) Temp Source: Oral (01/13 0700) BP: 116/51 (01/13 0900) Pulse Rate: 81 (01/13 0900)  Labs: Recent Labs    10/24/20 0612 10/24/20 1624 10/24/20 2000 10/25/20 0342 10/25/20 1559 10/25/20 1840 10/26/20 0152  HGB 7.1*  --   --  6.9*  --  7.6* 7.7*  HCT 21.9*  --   --  21.6*  --  22.6* 22.7*  PLT 170  --   --  169  --   --  158  HEPARINUNFRC 0.19*  --  0.32 0.34  --   --  0.43  CREATININE 1.63*   < >  --  1.64* 1.71*  --  1.89*   < > = values in this interval not displayed.    Estimated Creatinine Clearance: 30.2 mL/min (A) (by C-G formula based on SCr of 1.89 mg/dL (H)).    Assessment: 22 YOM with Covid.  Pharmacy dosing IV heparin for history of Afib while Eliquis is on hold.  Heparin level is therapeutic; no bleeding reported.  Pharmacy also dosing Merrem for possible right psoas abscess.  He is on Zyvox as well.  Afebrile, WBC trending down.  Patient is currently off CRRT.  1/2 Unasyn >>1/4 1/2 Doxy >> 1/2 1/4 cefepime >> 1/7 1/6 flagyl >>1/7 1/7 meropenem >> Vanc 1/8>>1/11 Zyvox 1/11 >>  1/8 MRSA PCR - negative 1/7 BCx - negative 1/2 MRSA PCR -negative 1/1 BCx - negative  Goal of Therapy:  Heparin level 0.3-0.7 units/ml Monitor platelets by anticoagulation protocol: Yes  Clearance of infection   Plan:  Continue heparin infusion at 700 units/hr Daily heparin level and CBC  Continue Zyvox 600mg  IV Q12H Change Merrem to 500mg  IV Q24H for HD  dosing, starting this PM F/u dialysis plans, clinical progress  Zeba Luby D. Mina Marble, PharmD, BCPS, [place] 10/26/2020, 12:25 PM

## 2020-10-26 NOTE — Progress Notes (Signed)
[facility] Progress Note    Assessment/ Plan:   1. Acute renal failure - baseline cr 1.2 in June 2021, now with severe AKI in the setting of multiple insults including COVID, diarrhea, and pancreatitis (also was taking entresto).  Severe AKI with Cr up to 8.6, falling UOP 1. CRRT 1/4-1/12. Plan for IHD tomorrow 2. Avoid nephrotoxic medications including NSAIDs and iodinated intravenous contrast exposure unless the latter is absolutely indicated.  Preferred narcotic agents for pain control are hydromorphone, fentanyl, and methadone. Morphine should not be used. Avoid Baclofen and avoid oral sodium phosphate and magnesium citrate based laxatives / bowel preps. Continue strict Input and Output monitoring. Will monitor the patient closely with you and intervene or adjust therapy as indicated by changes in clinical status/labs  2. Acute pancreatitis: Noted on CT scan 10/16/20 and again on 1/7. 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 Mgmt per primary service  3. Acute hypoxemic RF: likely aspiration PNA +/- COVID pneumonitis, stable o2 req. steroids  4. Shock: high WBC count, w/u per primary team. Possible ilacus abscess on CT from 1/7. Off levophed 1/11. Mero, zyvox  5. COVID-19 infection: vaccinated and boosted- treatment per primary  6. Acute encephalopathy: multifactorial, likely COVID + acute illness. CTM  8.  Chronic systolic CHF: EF 12/17/98 on TTE 60-65% with no WMA. Fluid removal as above  9.  Afib: hep and amiodarone po  Recommendations conveyed to primary service.    Subjective:   Due to the COVID pandemic, in attempts to limit transmission and over-utilization of resources (e.g. PPE), the patient was seen virtually today by means of chart review, discussion with staff, and virtual discussion with patient as needed.  CRRT clotted off 1/13 PM. Net neg 972cc. Remains hemodynamically stable.  Anuric.   Objective:   BP (!) 123/52   Pulse 85   Temp 98 F (36.7 C) (Oral)   Resp (!) 25   Ht 5\' 8"  (1.727 m)   Wt 91.5 kg   SpO2 96%   BMI 30.67 kg/m   Intake/Output Summary (Last 24 hours) at 10/26/2020 1202 Last data filed at 10/26/2020 1000 Gross per 24 hour  Intake 1852.64 ml  Output 2655 ml  Net -802.36 ml   Weight change: -1.8 kg  Physical Exam:    Not examined today  Imaging: DG Abd 1 View  Result Date: 10/25/2020 CLINICAL DATA:  Pneumonia due to COVID-19 EXAM: ABDOMEN - 1 VIEW COMPARISON:  Abdominal radiograph 10/22/2020, chest radiograph 10/25/2020 FINDINGS: Transesophageal feeding tube tip terminates near the gastric antrum/duodenal bulb. Positioning is quite similar to comparison exam. Telemetry leads overlie the lower chest. Opacities in the lung bases, better detailed on chest radiography. Slight paucity of abdominal bowel gas is nonspecific but without features of high-grade bowel obstruction. No suspicious calcifications in the included portions of the abdomen and pelvis. Few phleboliths stent noted in the low pelvis. Degenerative changes in the spine, hips and pelvis with mild dextrocurvature of the lumbar levels. IMPRESSION: 1. Transesophageal feeding tube tip terminates near the gastric antrum/duodenal bulb. Positioning is similar to comparison exam. Electronically Signed   By: Lovena Le M.D.   On: 10/25/2020 05:51   DG CHEST PORT 1 VIEW  Result Date: 10/25/2020 CLINICAL DATA:  COVID.  Pneumonia. EXAM: PORTABLE CHEST 1 VIEW COMPARISON:  10/22/2020.  CT 10/20/2020. FINDINGS: Feeding tube and dual-lumen right IJ catheter stable position. Mediastinum stable. Stable cardiomegaly. Bilateral interstitial infiltrates again  noted without significant interim change. No pleural effusion or pneumothorax. Stable biapical pleural thickening most consistent scarring. Degenerative change thoracic spine. IMPRESSION: 1. Feeding tube and dual-lumen right IJ catheter in stable  position. 2. Stable cardiomegaly. 3. Bilateral interstitial infiltrates again noted without significant interim change. Electronically Signed   ByMarcello Moores  Register   On: 10/25/2020 05:50    Labs: BMET Recent Labs  Lab 10/23/20 5409 10/23/20 1619 10/24/20 0612 10/24/20 1624 10/25/20 0342 10/25/20 1559 10/26/20 0152  NA 133* 134* 137 136 136 137 136  K 5.4* 3.8 3.6 3.5 3.8 3.9 3.9  CL 98 97* 101 99 101 101 101  CO2 21* 26 25 25 25 25 24   GLUCOSE 94 143* 127* 133* 157* 127* 111*  BUN 29* 26* 27* 28* 28* 30* 32*  CREATININE 1.50* 1.74* 1.63* 1.70* 1.64* 1.71* 1.89*  CALCIUM 7.7* 7.5* 7.7* 7.9* 8.1* 8.2* 8.1*  PHOS 3.4 3.6 3.4 2.9 2.1* 2.2* 2.1*   CBC Recent Labs  Lab 10/23/20 0232 10/24/20 0612 10/25/20 0342 10/25/20 1840 10/26/20 0152  WBC 85.8* 71.9* 72.7*  --  66.5*  HGB 7.3* 7.1* 6.9* 7.6* 7.7*  HCT 23.2* 21.9* 21.6* 22.6* 22.7*  MCV 111.5* 109.5* 111.3*  --  103.7*  PLT 124* 170 169  --  158    Medications:    . acetaminophen (TYLENOL) oral liquid 160 mg/5 mL  650 mg Oral Q6H  . amiodarone  400 mg Oral BID  . B-complex with vitamin C  1 tablet Oral Daily  . Chlorhexidine Gluconate Cloth  6 each Topical Daily  . feeding supplement  237 mL Oral BID BM  . Gerhardt's butt cream   Topical TID  . insulin aspart  3-9 Units Subcutaneous Q4H  . mouth rinse  15 mL Mouth Rinse BID  . phenol  1 spray Mouth/Throat Once  . predniSONE  40 mg Oral Q breakfast  . sodium chloride flush  10-40 mL Intracatheter Q12H  . sodium chloride flush  3 mL Intravenous Q12H  . triamcinolone 0.1 % cream : eucerin   Topical BID      Gean Quint  10/26/2020, 12:02 PM

## 2020-10-26 NOTE — Progress Notes (Signed)
  Speech Language Pathology Treatment: Dysphagia  Patient Details Name: Raymond Hurley. MRN: [id number] DOB: 21-Feb-1931 Today's Date: 10/26/2020 Time: 0932-3557 SLP Time Calculation (min) (ACUTE ONLY): 10 min  Assessment / Plan / Recommendation Clinical Impression  Pt demonstrates stable appearing swallow, no signs of aspiration with self fed sips of nectar or thin water. Pt taking very small sips today, less interested in PO. Recommend continuing nectar thick liquids as pt is still severely deconditioned and there has been ongoing concern for intermittent coughing with thins and even aspiration pna. Will f/u next week for further treatment sessions.   HPI HPI: Pt is an 87 y.o. male with medical history significant for atrial fibrillation on chronic anticoagulation, systolic CHF, melanoma, and vitamin B12 deficiency who presented after being found to be acutely altered with fever four days prior to admission. CXR 1/2: Stable small left pleural effusion. Left retrocardiac opacity with increased volume loss, indicating left lower lobe atelectasis with possible component of pneumonia. Found to have [place]. MRI brain negative.      SLP Plan  Continue with current plan of care       Recommendations  Diet recommendations: Dysphagia 3 (mechanical soft);Nectar-thick liquid Liquids provided via: Cup;Straw Medication Administration: Whole meds with puree Supervision: Staff to assist with self feeding Compensations: Slow rate;Small sips/bites Postural Changes and/or Swallow Maneuvers: Seated upright 90 degrees                Follow up Recommendations: Inpatient Rehab SLP Visit Diagnosis: Dysphagia, unspecified (R13.10) Plan: Continue with current plan of care       GO               Raymond [place], MA [place] Pager [phone] Office [phone]  [name] 10/26/2020, 1:47 PM

## 2020-10-26 NOTE — Progress Notes (Signed)
Updated pt's wife at length via phone.   [name], NP Pulmonary/Critical Care Medicine  10/26/2020  2:10 PM

## 2020-10-26 NOTE — Progress Notes (Signed)
NAME:  Raymond Hurley., MRN:  [id number], DOB:  19-Apr-1931, LOS: 12 ADMISSION DATE:  10/14/2020, CONSULTATION DATE:  10/14/2020 REFERRING MD:  [place] - [facility], CHIEF COMPLAINT:  Hypotension.    HPI/course in hospital   87 year old man who is hypotensive in the context of acute diarrhea on 1/1 was admitted w/ dx of COVID and acute dehydration from acute diarrheal illness. Of note he was fully vaccinated and had received his booster.  Critical care was consulted given hypotension however this responded to IV hydration and supportive care. Hospital course from 1/1 through 1/3:  Subsequently found to have aspiration pneumonia,  And, worsening delirium, and multiple organ failure, on 1/3  had worsening work of breathing more confusion, and CT of abdomen showed asymptomatic pancreatitis.  Given advanced age and multiple organ dysfunction in addition to concern about further clinical decline critical care asked to reevaluate  Past Medical History:   Atrial fibrillation, melanoma, vitamin B12 deficiency.  Systolic cardiomyopathy. Metastatic melanoma with mets to pancreas (followed at [facility])  Significant Hospital Events:  1/4 > hypotension and A.fib RVR.  Transferring to ICU. Started CRRT 1/7  Worsening leukocytosis.  CT abdomen pelvis with severe pancreatitis, possible iliopsoas abscess.  Deemed too unstable for intervention per surgery and interventional radiology 1/8 started pressors.  Antibiotics broadened to meropenem and vancomycin.  Made DNR 1/12 plan to stop CRRT when circuit clots off and then attempt iHD as hemodynamics have remained adequate off of NE   Consults:  PCCM, nephrology  Procedures:  Right IJ HD cath 1/4  Significant Diagnostic Tests:  1/7 CT a/p> inflammatory changes to suggest severe pancreatitis. No definite fluid collection to suggest pseudocyst. Sigmoid diverticulosis. R iliacus muscle with 4.6x2.3 cm density-- possible abscess   Micro Data:  Blood culture 1/1 > no  growth Blood culture 1/7 >  no growth  Antimicrobials:  Unasyn 1/2 >> 1/4 Cefepime 1/4 >> 1/7 Flagyl 1/6 >> 1/7  Meropenem 1/7 >>  Vancomycin 1/8 > 1/11 Zyvox 1/12>   Interim History / Subjective:  CRRT stopped overnight r/t clotting in line. Renal aware  OOB in chair this am, denies SOB. On RA  Objective   Blood pressure (!) 116/51, pulse 81, temperature 98 F (36.7 C), temperature source Oral, resp. rate 18, height 5\' 8"  (1.727 m), weight 91.5 kg, SpO2 100 %.        Intake/Output Summary (Last 24 hours) at 10/26/2020 0950 Last data filed at 10/26/2020 0700 Gross per 24 hour  Intake 2357.17 ml  Output 3422 ml  Net -1064.83 ml   Filed Weights   10/24/20 0500 10/25/20 0600 10/26/20 0500  Weight: 95.9 kg 93.3 kg 91.5 kg    Examination:  Gen:      Chronically ill frail elderly M, OOB in chair  HEENT:  NCAT pink mm. Scattered scabs to face anicteric sclera. Cortrak secure Lungs:   resps even non labored on RA, diminished bases otherwise clear  CV:         rrr s1s2 cap refill < 3  Abd:      Soft round non distended  Ext:    No obvious joint deformity no cyanosis or clubbing, non-pitting edema  Skin:       C/d/warm Neuro:  AAOx3 no focal deficits following commands   Assessment & Plan:   Acute metabolic encephalopathy secondary to uremia, sepsis -- improving  Plan -delirium precautions - abx as below    Acute renal failure requiring RRT  Hypophosphatemia, mild Suspect all secondary to ATN after recent diarrheal illness and volume depletion Hypocalcemia corrects with albumin Plan: -nephro following  -CRRT clotted off during night - renal aware suspect transition to iHD    Acute hypoxic respiratory failure 2/2 aspiration PNA, improved  COVID-19 infection  Plan -wean O2 as tolerated -supportive care -mobilize, pulmonary hygiene  - continue steroids - wean as tol   Septic shock, improved shock -has a possible abscess in iliacus muscle -- d/w IR, CCS on 1/7,  no surgery or drain indicated Plan -stress dose steroids off  -cont mero, zyvox   Acute pancreatitis  Plan -Increase enteral nutr  -cont to trend WBC, fever curve  Afib -- currently in sinus, but going in and out of Afib Systolic cardiomyopathy Echo 1/3 with EF 55 - 60% Plan -Cont hep gtt per pharm with fq conversions in and out of AFib -continue PO amio -mag goal  2, K goal 4   Acute anemia requiring transfusion  -1 PRBC 1/12 for Hgb 6.9. Think this is largely iatrogenic from blood draws, losses in CRRT circuit. Likely component of critical illness anemia, underlying chronic anemia P -goal hgb> 7 -if obvious bleeding or large hgb drop, dc hep gtt.   Will tx to progressive care and ask TRH to assume care 1/14   Best practice (evaluated daily)  Diet: Tube feeds + Dys 3 Pain/Anxiety/Delirium protocol (if indicated): NA VAP protocol (if indicated): NA DVT prophylaxis: heparin gtt per pharm  GI prophylaxis: PPI  Glucose control: SSI  Mobility: BR Disposition : tx progressive care    Goals of Care:  Last date of multidisciplinary goals of care discussion:  Family and staff present:  Summary of discussion: continue full support  Follow up goals of care discussion due:  Family updated: 1/11 --pending family updated 1/12, pt updated Code Status: DNR  Raymond Madrid, NP Pulmonary/Critical Care Medicine  10/26/2020  9:50 AM

## 2020-10-26 NOTE — Progress Notes (Signed)
Physical Therapy Treatment Patient Details Name: Raymond Hurley. MRN: [id number] DOB: Nov 07, 1930 Today's Date: 10/26/2020    History of Present Illness Pt is a 87 y.o. male admitted 1/1 with hypotension due to diarrhea with Covid 19 (vaccinated and boosted). 1/3: increased confusion with Aspiration PNA, multiple organ failure, pancreatitis. 1/4 AFib with RVR with tranfer to ICU and CRRT initiated. CRRT off 1/13. PMhx: metastatic melanoma with pancreatic head mass, A fib, CHF (EF 45% as of 08/2019)    PT Comments    Pt pleasant and eager to get OOB but fearful of falling. Pt reporting lightheadedness EOB with VSS. Pt reliant on RW for limited gait and fatigue limited but able to walk short distance and get up to chair. Pt educated for progress, HEP and need for continued assist and progression prior to eventual return home.   BP: EOB 121/95, after standing 150/92, in chair end of session 158/85  HR 89 SPO2 100% on RA   Follow Up Recommendations  SNF;Supervision/Assistance - 24 hour     Equipment Recommendations  Rolling walker with 5" wheels    Recommendations for Other Services       Precautions / Restrictions Precautions Precautions: Fall    Mobility  Bed Mobility Overal bed mobility: Needs Assistance Bed Mobility: Supine to Sit     Supine to sit: HOB elevated;Mod assist     General bed mobility comments: HOB 35 degrees with cues for sequence, assist to initiate and move legs toward EOB and elevate trunk with use of rail  Transfers Overall transfer level: Needs assistance   Transfers: Sit to/from Stand Sit to Stand: Min assist;From elevated surface         General transfer comment: min assist to stand from bed x 2 trials and from chair x 1 trial. cues for hand placement, anterior translation and assist to rise  Ambulation/Gait Ambulation/Gait assistance: Mod assist Gait Distance (Feet): 4 Feet Assistive device: Rolling walker (2 wheeled) Gait  Pattern/deviations: Step-to pattern;Decreased stride length;Trunk flexed;Shuffle   Gait velocity interpretation: <1.8 ft/sec, indicate of risk for recurrent falls General Gait Details: pt able to side step 2' toward foot of bed, sit then walk 4' with RW toward chair with max multimodal cues and physical assist to move and position RW   Stairs             Wheelchair Mobility    Modified Rankin (Stroke Patients Only)       Balance Overall balance assessment: Needs assistance Sitting-balance support: Bilateral upper extremity supported;Feet supported Sitting balance-Leahy Scale: Fair Sitting balance - Comments: minguard for lines and safety   Standing balance support: Bilateral upper extremity supported Standing balance-Leahy Scale: Poor Standing balance comment: RW for standing                            Cognition Arousal/Alertness: Awake/alert Behavior During Therapy: Flat affect Overall Cognitive Status: Impaired/Different from baseline Area of Impairment: Orientation;Memory                 Orientation Level: Time   Memory: Decreased short-term memory   Safety/Judgement: Decreased awareness of deficits;Decreased awareness of safety   Problem Solving: Slow processing;Requires verbal cues;Difficulty sequencing;Requires tactile cues        Exercises General Exercises - Lower Extremity Long Arc Quad: AROM;Both;Seated;10 reps Hip Flexion/Marching: AROM;Both;Standing;10 reps    General Comments        Pertinent Vitals/Pain Pain Assessment: No/denies pain  Home Living                      Prior Function            PT Goals (current goals can now be found in the care plan section) Progress towards PT goals: Progressing toward goals    Frequency    Min 3X/week      PT Plan Current plan remains appropriate    Co-evaluation              AM-PAC PT "6 Clicks" Mobility   Outcome Measure  Help needed turning from  your back to your side while in a flat bed without using bedrails?: A Little Help needed moving from lying on your back to sitting on the side of a flat bed without using bedrails?: A Lot Help needed moving to and from a bed to a chair (including a wheelchair)?: A Little Help needed standing up from a chair using your arms (e.g., wheelchair or bedside chair)?: A Little Help needed to walk in hospital room?: A Lot Help needed climbing 3-5 steps with a railing? : Total 6 Click Score: 14    End of Session Equipment Utilized During Treatment: Gait belt Activity Tolerance: Patient tolerated treatment well Patient left: in chair;with call bell/phone within reach;with chair alarm set Nurse Communication: Mobility status PT Visit Diagnosis: Other abnormalities of gait and mobility (R26.89);Difficulty in walking, not elsewhere classified (R26.2);Muscle weakness (generalized) (M62.81)     Time: 4782-9562 PT Time Calculation (min) (ACUTE ONLY): 31 min  Charges:  $Gait Training: 8-22 mins $Therapeutic Activity: 8-22 mins                     Drexler Maland P, PT Acute Rehabilitation Services Pager: [phone] Office: [place] 10/26/2020, 12:48 PM

## 2020-10-26 NOTE — Progress Notes (Signed)
CRRT began to constantly alarm access extremely negative and return undetectable, shortly after obtaining midnight maintenance flow and pressure numbers. Followed by those alarms, I received a warning: air in blood. At this time CRRT could not be continued, and a large clot was noted to have formed in the deaeration chamber to the point it was obstructing blood flow. CRRT stopped at approximately 00:20; blood was able to be returned while carefully monitoring for clots in the return line; patient disconnected without complication, and properly disposed of used filter set in red biohazard bag. Will notify nephrology in AM and anticipate iHD to take place shortly after.

## 2020-10-27 DIAGNOSIS — R6520 Severe sepsis without septic shock: Secondary | ICD-10-CM | POA: Diagnosis not present

## 2020-10-27 DIAGNOSIS — A419 Sepsis, unspecified organism: Secondary | ICD-10-CM | POA: Diagnosis not present

## 2020-10-27 LAB — RENAL FUNCTION PANEL
Albumin: 2.2 g/dL — ABNORMAL LOW (ref 3.5–5.0)
Albumin: 2.3 g/dL — ABNORMAL LOW (ref 3.5–5.0)
Anion gap: 13 (ref 5–15)
Anion gap: 13 (ref 5–15)
BUN: 77 mg/dL — ABNORMAL HIGH (ref 8–23)
BUN: 45 mg/dL — ABNORMAL HIGH (ref 8–23)
CO2: 22 mmol/L (ref 22–32)
CO2: 24 mmol/L (ref 22–32)
Calcium: 8.1 mg/dL — ABNORMAL LOW (ref 8.9–10.3)
Calcium: 7.8 mg/dL — ABNORMAL LOW (ref 8.9–10.3)
Chloride: 100 mmol/L (ref 98–111)
Chloride: 99 mmol/L (ref 98–111)
Creatinine, Ser: 4.47 mg/dL — ABNORMAL HIGH (ref 0.61–1.24)
Creatinine, Ser: 2.94 mg/dL — ABNORMAL HIGH (ref 0.61–1.24)
GFR, Estimated: 12 mL/min — ABNORMAL LOW (ref >60)
GFR, Estimated: 20 mL/min — ABNORMAL LOW (ref >60)
Glucose, Bld: 121 mg/dL — ABNORMAL HIGH (ref 70–99)
Glucose, Bld: 194 mg/dL — ABNORMAL HIGH (ref 70–99)
Phosphorus: 5.1 mg/dL — ABNORMAL HIGH (ref 2.5–4.6)
Phosphorus: 3.7 mg/dL (ref 2.5–4.6)
Potassium: 4.2 mmol/L (ref 3.5–5.1)
Potassium: 4.2 mmol/L (ref 3.5–5.1)
Sodium: 135 mmol/L (ref 135–145)
Sodium: 136 mmol/L (ref 135–145)

## 2020-10-27 LAB — CBC
HCT: 19.9 % — ABNORMAL LOW (ref 39.0–52.0)
HCT: 26.6 % — ABNORMAL LOW (ref 39.0–52.0)
Hemoglobin: 6.6 g/dL — CL (ref 13.0–17.0)
Hemoglobin: 8.8 g/dL — ABNORMAL LOW (ref 13.0–17.0)
MCH: 34.9 pg — ABNORMAL HIGH (ref 26.0–34.0)
MCH: 33.2 pg (ref 26.0–34.0)
MCHC: 33.2 g/dL (ref 30.0–36.0)
MCHC: 33.1 g/dL (ref 30.0–36.0)
MCV: 105.3 fL — ABNORMAL HIGH (ref 80.0–100.0)
MCV: 100.4 fL — ABNORMAL HIGH (ref 80.0–100.0)
Platelets: 127 10*3/uL — ABNORMAL LOW (ref 150–400)
Platelets: 87 10*3/uL — ABNORMAL LOW (ref 150–400)
RBC: 1.89 MIL/uL — ABNORMAL LOW (ref 4.22–5.81)
RBC: 2.65 MIL/uL — ABNORMAL LOW (ref 4.22–5.81)
RDW: 20.1 % — ABNORMAL HIGH (ref 11.5–15.5)
RDW: 20 % — ABNORMAL HIGH (ref 11.5–15.5)
WBC: 54.7 10*3/uL (ref 4.0–10.5)
WBC: 64.4 10*3/uL (ref 4.0–10.5)
nRBC: 0.7 % — ABNORMAL HIGH (ref 0.0–0.2)
nRBC: 0.3 % — ABNORMAL HIGH (ref 0.0–0.2)

## 2020-10-27 LAB — TYPE AND SCREEN
ABO/RH(D): O POS
Antibody Screen: NEGATIVE
Unit division: 0

## 2020-10-27 LAB — GLUCOSE, CAPILLARY
Glucose-Capillary: 115 mg/dL — ABNORMAL HIGH (ref 70–99)
Glucose-Capillary: 128 mg/dL — ABNORMAL HIGH (ref 70–99)
Glucose-Capillary: 133 mg/dL — ABNORMAL HIGH (ref 70–99)
Glucose-Capillary: 169 mg/dL — ABNORMAL HIGH (ref 70–99)
Glucose-Capillary: 194 mg/dL — ABNORMAL HIGH (ref 70–99)
Glucose-Capillary: 202 mg/dL — ABNORMAL HIGH (ref 70–99)

## 2020-10-27 LAB — PREPARE RBC (CROSSMATCH)

## 2020-10-27 LAB — HEPARIN LEVEL (UNFRACTIONATED)
Heparin Unfractionated: 0.24 IU/mL — ABNORMAL LOW (ref 0.30–0.70)
Heparin Unfractionated: 0.43 IU/mL (ref 0.30–0.70)

## 2020-10-27 LAB — BPAM RBC
Blood Product Expiration Date: 202202122359
ISSUE DATE / TIME: 202201120835
Unit Type and Rh: 5100

## 2020-10-27 LAB — MAGNESIUM: Magnesium: 2.7 mg/dL — ABNORMAL HIGH (ref 1.7–2.4)

## 2020-10-27 MED ORDER — SODIUM CHLORIDE 0.9 % IV SOLN: 100.0000 mL | INTRAVENOUS | Status: DC | PRN

## 2020-10-27 MED ORDER — ALTEPLASE 2 MG IJ SOLR: 2.0000 mg | Freq: Once | INTRAMUSCULAR | Status: DC | PRN

## 2020-10-27 MED ORDER — HEPARIN SODIUM (PORCINE) 1000 UNIT/ML DIALYSIS: 1000.0000 [IU] | INTRAMUSCULAR | Status: DC | PRN

## 2020-10-27 MED ORDER — LIDOCAINE HCL (PF) 1 % IJ SOLN
5.0000 mL | INTRAMUSCULAR | Status: DC | PRN
  Filled 2020-10-27: qty 5

## 2020-10-27 MED ORDER — HEPARIN SODIUM (PORCINE) 1000 UNIT/ML IJ SOLN
INTRAMUSCULAR | Status: AC
  Filled 2020-10-27: qty 4

## 2020-10-27 MED ORDER — CHLORHEXIDINE GLUCONATE CLOTH 2 % EX PADS
6.0000 | MEDICATED_PAD | Freq: Every day | CUTANEOUS | Status: DC
  Administered 2020-10-27 – 2020-11-10 (×14): 6 via TOPICAL

## 2020-10-27 MED ORDER — LIDOCAINE-PRILOCAINE 2.5-2.5 % EX CREA
1.0000 "application " | TOPICAL_CREAM | CUTANEOUS | Status: DC | PRN
  Filled 2020-10-27: qty 5

## 2020-10-27 MED ORDER — SODIUM CHLORIDE 0.9% IV SOLUTION: Freq: Once | INTRAVENOUS | Status: AC

## 2020-10-27 MED ORDER — PENTAFLUOROPROP-TETRAFLUOROETH EX AERO: 1.0000 "application " | INHALATION_SPRAY | CUTANEOUS | Status: DC | PRN

## 2020-10-27 NOTE — Progress Notes (Signed)
[facility] [facility] Progress Note Patient Name: Raymond Hurley. DOB: 05/10/1931 MRN: [id number]   Date of Service  10/27/2020  HPI/Events of Note  Notified of anemia with hgb 6.6. <-- 7.7.  eICU Interventions  Transfuse 1 unit pRBC.     Intervention Category Intermediate Interventions: Other:  Elsie Lincoln 10/27/2020, 5:54 AM

## 2020-10-27 NOTE — Progress Notes (Signed)
[facility] Progress Note    Assessment/ Plan:   1. Acute renal failure - baseline cr 1.2 in June 2021, now with severe AKI in the setting of multiple insults including COVID, diarrhea, and pancreatitis (also was taking entresto).  Severe AKI with Cr up to 8.6, falling UOP 1. CRRT 1/4-1/12. Due to high dialysis census and staffing, will need to push HD to tomorrow. From a volume and labs standpoint, will be okay to do HD tomorrow 2. Would really recommend palliative addressing his overall GOC. My concern is his long-term candidacy for dialysis. There are no signs of renal recovery at the moment. 3. Avoid nephrotoxic medications including NSAIDs and iodinated intravenous contrast exposure unless the latter is absolutely indicated.  Preferred narcotic agents for pain control are hydromorphone, fentanyl, and methadone. Morphine should not be used. Avoid Baclofen and avoid oral sodium phosphate and magnesium citrate based laxatives / bowel preps. Continue strict Input and Output monitoring. Will monitor the patient closely with you and intervene or adjust therapy as indicated by changes in clinical status/labs  2. Acute pancreatitis: Noted on CT scan 10/16/20 and again on 1/7. 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 Mgmt per primary service  3. Acute hypoxemic RF: likely aspiration PNA +/- COVID pneumonitis, stable o2 req. steroids  4. Shock: high WBC count, w/u per primary team. Possible ilacus abscess on CT from 1/7. Off levophed 1/11. Mero, zyvox  5. COVID-19 infection: vaccinated and boosted- treatment per primary  6. Acute encephalopathy: multifactorial, likely COVID + acute illness. CTM  8.  Chronic systolic CHF: EF 3/0/86 on TTE 60-65% with no WMA. Fluid removal as above  9.  Afib: hep and amiodarone po    Subjective:    Seen and examined bedside, no acute events. Moving HD to tomorrow AM. O2 req  stable. 1u PRBC today, patient reports that his breathing is slightly better but overall still sob.   Objective:   BP 110/63   Pulse 79   Temp 97.9 F (36.6 C) (Oral)   Resp 19   Ht 5\' 8"  (1.727 m)   Wt 90.9 kg   SpO2 97%   BMI 30.47 kg/m   Intake/Output Summary (Last 24 hours) at 10/27/2020 1249 Last data filed at 10/27/2020 1000 Gross per 24 hour  Intake 2257.43 ml  Output 600 ml  Net 1657.43 ml   Weight change: -0.6 kg  Physical Exam:    Gen: ill appearing, sitting up in bed Resp: unlabored, on [place] CV: reg rate Abd: soft, nt/nd Ext: trace edema bl le's Neuro: follows commands, moves all ext spontaneously  Imaging: No results found.  Labs: BMET Recent Labs  Lab 10/24/20 0612 10/24/20 1624 10/25/20 0342 10/25/20 1559 10/26/20 0152 10/26/20 1542 10/27/20 0429  NA 137 136 136 137 136 134* 135  K 3.6 3.5 3.8 3.9 3.9 4.2 4.2  CL 101 99 101 101 101 100 100  CO2 25 25 25 25 24  21* 22  GLUCOSE 127* 133* 157* 127* 111* 199* 121*  BUN 27* 28* 28* 30* 32* 59* 77*  CREATININE 1.63* 1.70* 1.64* 1.71* 1.89* 3.30* 4.47*  CALCIUM 7.7* 7.9* 8.1* 8.2* 8.1* 7.6* 8.1*  PHOS 3.4 2.9 2.1* 2.2* 2.1* 3.2 5.1*   CBC Recent Labs  Lab 10/24/20 0612 10/25/20 0342 10/25/20 1840 10/26/20 0152 10/27/20 0429  WBC 71.9* 72.7*  --  66.5* 54.7*  HGB 7.1* 6.9* 7.6* 7.7* 6.6*  HCT 21.9*  21.6* 22.6* 22.7* 19.9*  MCV 109.5* 111.3*  --  103.7* 105.3*  PLT 170 169  --  158 127*    Medications:    . acetaminophen (TYLENOL) oral liquid 160 mg/5 mL  650 mg Oral Q6H  . amiodarone  400 mg Oral BID  . B-complex with vitamin C  1 tablet Oral Daily  . Chlorhexidine Gluconate Cloth  6 each Topical Q1200  . feeding supplement  237 mL Oral BID BM  . Gerhardt's butt cream   Topical TID  . insulin aspart  3-9 Units Subcutaneous Q4H  . mouth rinse  15 mL Mouth Rinse BID  . phenol  1 spray Mouth/Throat Once  . predniSONE  40 mg Oral Q breakfast  . sodium chloride flush  10-40 mL  Intracatheter Q12H  . sodium chloride flush  3 mL Intravenous Q12H  . triamcinolone 0.1 % cream : eucerin   Topical BID      Gean Quint  10/27/2020, 12:49 PM

## 2020-10-27 NOTE — Progress Notes (Signed)
CRITICAL VALUE ALERT  Critical Value:  Hgb 6.6  Date & Time Notied:  10/27/20 @ 0539  Provider Notified: Warren Lacy MD  Orders Received/Actions taken: bedside RN Clarise Cruz made aware also

## 2020-10-27 NOTE — Progress Notes (Signed)
ANTICOAGULATION CONSULT NOTE  Pharmacy Consult:  Heparin Indication:  Afib  Allergies  Allergen Reactions  . Povidone-Iodine Swelling  . Tape Swelling  . Bee Venom Rash  . Covid-19 Mrna Vacc (Moderna) Hives and Rash    Rash came after booster shot. Pt was fine with 1st and 2nd    Patient Measurements: Height: 5\' 8"  (172.7 cm) Weight: 90.9 kg (200 lb 6.4 oz) IBW/kg (Calculated) : 68.4 Heparin Dosing Weight: 87 kg  Vital Signs: Temp: 97.6 F (36.4 C) (01/14 0806) Temp Source: Oral (01/14 0806) BP: 121/50 (01/14 0700) Pulse Rate: 72 (01/14 0700)  Labs: Recent Labs    10/25/20 0342 10/25/20 1559 10/25/20 1840 10/26/20 0152 10/26/20 1542 10/27/20 0429  HGB 6.9*  --  7.6* 7.7*  --  6.6*  HCT 21.6*  --  22.6* 22.7*  --  19.9*  PLT 169  --   --  158  --  127*  HEPARINUNFRC 0.34  --   --  0.43  --  0.24*  CREATININE 1.64*   < >  --  1.89* 3.30* 4.47*   < > = values in this interval not displayed.    Estimated Creatinine Clearance: 12.7 mL/min (A) (by C-G formula based on SCr of 4.47 mg/dL (H)).   Assessment: 44 YOM with Covid.  Pharmacy dosing IV heparin for history of Afib while Eliquis is on hold.  Heparin level is sub-therapeutic this AM.  No issue with heparin infusion and no overt bleeding observed per RN.  Hemoglobin < 7 and patient is getting 1 unit PRBC.    Goal of Therapy:  Heparin level 0.3-0.7 units/ml Monitor platelets by anticoagulation protocol: Yes    Plan:  Increase heparin infusion to 800 units/hr Check 8 hr heparin level Daily heparin level and CBC MD to advise on [facility] plans  Kalon Erhardt D. Mina Marble, PharmD, BCPS, [place] 10/27/2020, 9:00 AM

## 2020-10-27 NOTE — Progress Notes (Signed)
ANTICOAGULATION CONSULT NOTE  Pharmacy Consult:  Heparin Indication:  Afib  Allergies  Allergen Reactions  . Povidone-Iodine Swelling  . Tape Swelling  . Bee Venom Rash  . Covid-19 Mrna Vacc (Moderna) Hives and Rash    Rash came after booster shot. Pt was fine with 1st and 2nd    Patient Measurements: Height: 5\' 8"  (172.7 cm) Weight: 90.9 kg (200 lb 6.4 oz) IBW/kg (Calculated) : 68.4 Heparin Dosing Weight: 87 kg  Vital Signs: Temp: 97.8 F (36.6 C) (01/14 1705) Temp Source: Axillary (01/14 1705) BP: 115/59 (01/14 1705) Pulse Rate: 84 (01/14 1705)  Labs: Recent Labs    10/25/20 0342 10/25/20 1559 10/25/20 1840 10/26/20 0152 10/26/20 1542 10/27/20 0429 10/27/20 1803  HGB 6.9*  --  7.6* 7.7*  --  6.6*  --   HCT 21.6*  --  22.6* 22.7*  --  19.9*  --   PLT 169  --   --  158  --  127*  --   HEPARINUNFRC 0.34  --   --  0.43  --  0.24* 0.43  CREATININE 1.64*   < >  --  1.89* 3.30* 4.47*  --    < > = values in this interval not displayed.    Estimated Creatinine Clearance: 12.7 mL/min (A) (by C-G formula based on SCr of 4.47 mg/dL (H)).   Assessment: 33 YOM with Covid.  Pharmacy dosing IV heparin for history of Afib while Eliquis is on hold.    Heparin level is therapeutic this afternoon.  No issue with heparin infusion or bleeding noted.  Hemoglobin 8.8 s/p PRBC.    Goal of Therapy:  Heparin level 0.3-0.7 units/ml Monitor platelets by anticoagulation protocol: Yes    Plan:  Continue heparin infusion 800 units/hr Check 8 hr heparin level Daily heparin level and CBC MD to advise on [facility] plans   Thank you for allowing [place] to participate in this patients care.   Jens Som, PharmD Please see amion for complete clinical pharmacist phone list. 10/27/2020 7:09 PM

## 2020-10-27 NOTE — Progress Notes (Signed)
Physical Therapy Treatment Patient Details Name: Raymond Hurley. MRN: [id number] DOB: 09-Mar-1931 Today's Date: 10/27/2020    History of Present Illness Pt is a 87 y.o. male admitted 1/1 with hypotension due to diarrhea with Covid 19 (vaccinated and boosted). 1/3: increased confusion with Aspiration PNA, multiple organ failure, pancreatitis. 1/4 AFib with RVR with tranfer to ICU and CRRT initiated. CRRT off 1/13. PMhx: metastatic melanoma with pancreatic head mass, A fib, CHF (EF 45% as of 08/2019)    PT Comments    Pt very pleasant and receiving blood with report of initial dizziness EOB with BP 161/92 and decreased symptoms with prolonged sitting. Pt with HR 76 and SpO2 94% on RA. Pt able to progress gait and HEP today and transition to chair. Pt aware of deficits and that he has significant endurance and strength to regain prior to eventual return home. Will continue to follow and encouraged OOB for meals.     Follow Up Recommendations  SNF;Supervision/Assistance - 24 hour     Equipment Recommendations  Rolling walker with 5" wheels    Recommendations for Other Services       Precautions / Restrictions Precautions Precautions: Fall    Mobility  Bed Mobility Overal bed mobility: Needs Assistance Bed Mobility: Supine to Sit     Supine to sit: HOB elevated;Min assist     General bed mobility comments: increased time, HOB 30 degrees, cues for sequence with assist to move legs toward EOB  Transfers Overall transfer level: Needs assistance   Transfers: Sit to/from Stand Sit to Stand: Min assist         General transfer comment: cues for hand placement and anterior translation with assist to rise from bed  Ambulation/Gait Ambulation/Gait assistance: Min assist Gait Distance (Feet): 14 Feet Assistive device: Rolling walker (2 wheeled) Gait Pattern/deviations: Step-through pattern;Decreased stride length;Trunk flexed   Gait velocity interpretation: <1.8 ft/sec,  indicate of risk for recurrent falls General Gait Details: cues for posture, position in RW and increased step length with pt able to increase distance with significantly improved stability   Stairs             Wheelchair Mobility    Modified Rankin (Stroke Patients Only)       Balance Overall balance assessment: Needs assistance Sitting-balance support: No upper extremity supported;Feet supported Sitting balance-Leahy Scale: Fair Sitting balance - Comments: EOB with guarding for safety   Standing balance support: Bilateral upper extremity supported Standing balance-Leahy Scale: Poor Standing balance comment: RW for standing                            Cognition Arousal/Alertness: Awake/alert Behavior During Therapy: Flat affect Overall Cognitive Status: Impaired/Different from baseline Area of Impairment: Orientation                 Orientation Level: Time Current Attention Level: Selective     Safety/Judgement: Decreased awareness of deficits   Problem Solving: Slow processing        Exercises General Exercises - Lower Extremity Long Arc Quad: AROM;Both;Seated;20 reps Hip Flexion/Marching: Standing;AAROM;AROM;Right;Left;20 reps (AAROM on RLE) Toe Raises: AROM;Both;Seated;20 reps Heel Raises: AROM;Both;Seated;20 reps    General Comments        Pertinent Vitals/Pain Pain Assessment: No/denies pain    Home Living                      Prior Function  PT Goals (current goals can now be found in the care plan section) Progress towards PT goals: Progressing toward goals    Frequency    Min 3X/week      PT Plan Current plan remains appropriate    Co-evaluation              AM-PAC PT "6 Clicks" Mobility   Outcome Measure  Help needed turning from your back to your side while in a flat bed without using bedrails?: A Little Help needed moving from lying on your back to sitting on the side of a flat  bed without using bedrails?: A Little Help needed moving to and from a bed to a chair (including a wheelchair)?: A Little Help needed standing up from a chair using your arms (e.g., wheelchair or bedside chair)?: A Little Help needed to walk in hospital room?: A Little Help needed climbing 3-5 steps with a railing? : Total 6 Click Score: 16    End of Session Equipment Utilized During Treatment: Gait belt Activity Tolerance: Patient tolerated treatment well Patient left: in chair;with call bell/phone within reach;with chair alarm set Nurse Communication: Mobility status PT Visit Diagnosis: Other abnormalities of gait and mobility (R26.89);Difficulty in walking, not elsewhere classified (R26.2);Muscle weakness (generalized) (M62.81)     Time: 3729-0211 PT Time Calculation (min) (ACUTE ONLY): 33 min  Charges:  $Gait Training: 8-22 mins $Therapeutic Exercise: 8-22 mins                     Raymond Hurley P, PT Acute Rehabilitation Services Pager: [phone] Office: [place] 10/27/2020, 11:56 AM

## 2020-10-27 NOTE — Progress Notes (Signed)
PROGRESS NOTE    Raymond Hurley.  XLK:[id number] DOB: [birth year] DOA: 10/14/2020 PCP: Lajean Manes, MD   Brief Narrative:  87 year old man who is hypotensive in the context of acute diarrhea on 1/1 was admitted w/ dx of COVID and acute dehydration from acute diarrheal illness. Of note he was fully vaccinated and had received his booster. Given worsening delirium, multiple organ failure, and profound hypotension with CT abdomen showing pancreatitis.  Given advanced age and multiple organ dysfunction in addition to concern about further clinical decline patient was transitioned to critical care team.  1/4 > hypotension and A.fib RVR.  Transferring to ICU. Started CRRT 1/7 > Worsening leukocytosis.  CT abdomen pelvis with severe pancreatitis, possible iliopsoas abscess.  Deemed too unstable for intervention per surgery and interventional radiology 1/8 > started pressors.  Antibiotics broadened to meropenem and vancomycin.  Made DNR 1/12 > plan to stop CRRT when circuit clots off and then attempt iHD as hemodynamics have remained adequate off of NE.  Assessment & Plan:   Principal Problem:   Severe sepsis ([place]) Active Problems:   PAF (paroxysmal atrial fibrillation) (HCC)   Anticoagulated   Pneumonia due to COVID-19 virus   Chronic systolic CHF (congestive heart failure) (HCC)   Elevated troponin   Facial weakness   Hypocalcemia   Macrocytic anemia   Acute metabolic encephalopathy   Acute respiratory failure (HCC)   AKI (acute kidney injury) (HCC)   Dehydration   Persistent atrial fibrillation (HCC)   Acute hypoxic respiratory failure 2/2 aspiration PNA, improved  COVID-19 infection  -Continue to wean O2 as tolerated -Continue early ambulation, incentive spirometry, flutter - continue steroids - wean as indicated  Septic shock, reesolved Possible iliac abscess -Previous team discussed with IR/CCS on 1/7, no surgery or drain indicated -stress dose steroids off  -cont  mero, zyvox for antibiotic coverage, likely prolonged course given no clear ability to drain  Acute metabolic encephalopathy, multifactorial: In the setting of uremia, septic shock, pancreatitis, anemia and COVID-19 infection  -Continue supportive care, delirium precautions -Mental status appears to be improving drastically ANO x4 this morning; very appropriate questions  Acute renal failure requiring RRT  Hypophosphatemia, mild - Suspect secondary to ATN after recent diarrheal illness and volume depletion in the setting of profound disease, septic shock, COVID infection - Nephro following - CRRT clotted off 10/25/20 - renal aware suspect transition to iHD as of 10/27/20  Acute pancreatitis, resolving -Continue pain control, supportive care, leukocytosis still markedly elevated -NG feeding ongoing  Afib, likely paroxysmal; questionably provoked Systolic cardiomyopathy Echo 1/3 with EF 55 - 60% -In and out of A. fib over the past 24 hours  -Cont hep gtt per pharm for now -hoping to be more rate controlled after acute illness has resolved, unclear if patient will need prolonged anticoagulation given likely provoked nature of A. fib -continue PO amio  Acute anemia requiring transfusion; Likely in the setting of kidney disease, profound dehydration and acute illness Rule out acute blood loss anemia -Transfusion 1/12 and 1/13 overnight - Likely iatrogenic from blood draws, losses in CRRT circuit. Likely component of critical illness anemia, underlying chronic anemia -Scant blood noted at rectal tube today, appears to be irritation and not profound GI bleed, unlikely source of patient's anemia but will continue to follow - Goal hgb> 7   DVT prophylaxis: hep gtt  Code Status: DNR Family Communication: None  Status is: Inpatient  Dispo: The patient is from: Home  Anticipated d/c is to: To be determined              Anticipated d/c date is: > 72 hours               Patient currently not medically stable for discharge  Consultants:   PCCM, nephrology, GI, cardiology, neurology  Antimicrobials:  Unasyn 1/2 >> 1/4 Cefepime 1/4 >> 1/7 Flagyl 1/6 >> 1/7 Vancomycin 1/8 > 1/11  Meropenem 1/7 >> ongoing Zyvox 1/12>  ongoing  Subjective: No acute issues or events overnight, patient feeling markedly improved this morning, tolerating PT today much more appropriately than previous.  Denies nausea, vomiting, constipation, headache, fevers, chills.  Objective: Vitals:   10/27/20 0300 10/27/20 0400 10/27/20 0500 10/27/20 0600  BP: (!) 115/51 (!) 119/55 (!) 108/53 (!) 122/50  Pulse: 68 69 70 71  Resp: 17 15 17 17   Temp:  98 F (36.7 C)    TempSrc:  Oral    SpO2: 96% 100% 98% 100%  Weight:  90.9 kg    Height:        Intake/Output Summary (Last 24 hours) at 10/27/2020 0709 Last data filed at 10/27/2020 0600 Gross per 24 hour  Intake 2075.11 ml  Output 600 ml  Net 1475.11 ml   Filed Weights   10/25/20 0600 10/26/20 0500 10/27/20 0400  Weight: 93.3 kg 91.5 kg 90.9 kg    Examination:  General: Elderly frail gentleman sitting in bedside chair no acute distress HEENT: Multiple excoriations and scabs to the face, NG tube secured in right nare.  Sclerae nonicteric, noninjected.  Extraocular movements intact bilaterally. Neck: Right IJ central line noted, bandage clean dry intact. Lungs: Diminished bilaterally without overt rhonchi, wheeze, or rales. Heart:  Regular rate and rhythm.  Without murmurs, rubs, or gallops. Abdomen:  Soft, diffusely tender no PMI, minimally distended.  Without guarding or rebound. Extremities: Without cyanosis, clubbing, edema, or obvious deformity. Vascular:  Dorsalis pedis and posterior tibial pulses palpable bilaterally. Skin:  Warm and dry, no erythema, no ulcerations.  Data Reviewed: I have personally reviewed following labs and imaging studies  CBC: Recent Labs  Lab 10/23/20 0232 10/24/20 0612 10/25/20 0342  10/25/20 1840 10/26/20 0152 10/27/20 0429  WBC 85.8* 71.9* 72.7*  --  66.5* 54.7*  HGB 7.3* 7.1* 6.9* 7.6* 7.7* 6.6*  HCT 23.2* 21.9* 21.6* 22.6* 22.7* 19.9*  MCV 111.5* 109.5* 111.3*  --  103.7* 105.3*  PLT 124* 170 169  --  158 814*   Basic Metabolic Panel: Recent Labs  Lab 10/23/20 0232 10/23/20 0233 10/24/20 0612 10/24/20 1624 10/25/20 0342 10/25/20 1559 10/26/20 0152 10/26/20 1542 10/27/20 0429  NA  --    < > 137   < > 136 137 136 134* 135  K  --    < > 3.6   < > 3.8 3.9 3.9 4.2 4.2  CL  --    < > 101   < > 101 101 101 100 100  CO2  --    < > 25   < > 25 25 24  21* 22  GLUCOSE  --    < > 127*   < > 157* 127* 111* 199* 121*  BUN  --    < > 27*   < > 28* 30* 32* 59* 77*  CREATININE  --    < > 1.63*   < > 1.64* 1.71* 1.89* 3.30* 4.47*  CALCIUM  --    < > 7.7*   < > 8.1*  8.2* 8.1* 7.6* 8.1*  MG 2.6*  --  2.6*  --  2.5*  --  2.6*  --  2.7*  PHOS  --    < > 3.4   < > 2.1* 2.2* 2.1* 3.2 5.1*   < > = values in this interval not displayed.   GFR: Estimated Creatinine Clearance: 12.7 mL/min (A) (by C-G formula based on SCr of 4.47 mg/dL (H)). Liver Function Tests: Recent Labs  Lab 10/25/20 0342 10/25/20 1559 10/26/20 0152 10/26/20 1542 10/27/20 0429  AST 47*  --   --   --   --   ALT 53*  --   --   --   --   ALKPHOS 225*  --   --   --   --   BILITOT 0.7  --   --   --   --   PROT 5.3*  --   --   --   --   ALBUMIN 2.5* 2.6* 2.4* 2.3* 2.2*   No results for input(s): LIPASE, AMYLASE in the last 168 hours. No results for input(s): AMMONIA in the last 168 hours. Coagulation Profile: No results for input(s): INR, PROTIME in the last 168 hours. Cardiac Enzymes: No results for input(s): CKTOTAL, CKMB, CKMBINDEX, TROPONINI in the last 168 hours. BNP (last 3 results) No results for input(s): PROBNP in the last 8760 hours. HbA1C: No results for input(s): HGBA1C in the last 72 hours. CBG: Recent Labs  Lab 10/26/20 1101 10/26/20 1533 10/26/20 1949 10/26/20 2357  10/27/20 0344  GLUCAP 170* 180* 118* 152* 115*   Lipid Profile: No results for input(s): CHOL, HDL, LDLCALC, TRIG, CHOLHDL, LDLDIRECT in the last 72 hours. Thyroid Function Tests: No results for input(s): TSH, T4TOTAL, FREET4, T3FREE, THYROIDAB in the last 72 hours. Anemia Panel: No results for input(s): VITAMINB12, FOLATE, FERRITIN, TIBC, IRON, RETICCTPCT in the last 72 hours. Sepsis Labs: Recent Labs  Lab 10/20/20 0715  PROCALCITON 0.80    Recent Results (from the past 240 hour(s))  Culture, blood (Routine X 2) w Reflex to ID Panel     Status: None   Collection Time: 10/20/20  4:40 PM   Specimen: BLOOD RIGHT HAND  Result Value Ref Range Status   Specimen Description BLOOD RIGHT HAND  Final   Special Requests   Final    BOTTLES DRAWN AEROBIC AND ANAEROBIC Blood Culture adequate volume   Culture   Final    NO GROWTH 5 DAYS Performed at [facility], [place] [address], [place], [place] [address]    Report Status 10/25/2020 FINAL  Final  Culture, blood (Routine X 2) w Reflex to ID Panel     Status: None   Collection Time: 10/20/20  4:43 PM   Specimen: BLOOD  Result Value Ref Range Status   Specimen Description BLOOD LEFT ANTECUBITAL  Final   Special Requests   Final    BOTTLES DRAWN AEROBIC ONLY Blood Culture results may not be optimal due to an inadequate volume of blood received in culture bottles   Culture   Final    NO GROWTH 5 DAYS Performed at [facility], [place] [address], [place], [place] [address]    Report Status 10/25/2020 FINAL  Final  MRSA PCR Screening     Status: None   Collection Time: 10/21/20 10:08 AM   Specimen: Nasal Mucosa; Nasopharyngeal  Result Value Ref Range Status   MRSA by PCR NEGATIVE NEGATIVE Final    Comment:  The GeneXpert MRSA Assay (FDA approved for NASAL specimens only), is one component of a comprehensive MRSA colonization surveillance program. It is not intended to diagnose MRSA infection nor to guide or monitor  treatment for MRSA infections. Performed at [facility], [place] [address], [place],  [address]     Radiology Studies: No results found.  Scheduled Meds: . sodium chloride   Intravenous Once  . acetaminophen (TYLENOL) oral liquid 160 mg/5 mL  650 mg Oral Q6H  . amiodarone  400 mg Oral BID  . B-complex with vitamin C  1 tablet Oral Daily  . Chlorhexidine Gluconate Cloth  6 each Topical Q1200  . feeding supplement  237 mL Oral BID BM  . Gerhardt's butt cream   Topical TID  . insulin aspart  3-9 Units Subcutaneous Q4H  . mouth rinse  15 mL Mouth Rinse BID  . phenol  1 spray Mouth/Throat Once  . predniSONE  40 mg Oral Q breakfast  . sodium chloride flush  10-40 mL Intracatheter Q12H  . sodium chloride flush  3 mL Intravenous Q12H  . triamcinolone 0.1 % cream : eucerin   Topical BID   Continuous Infusions: . sodium chloride 10 mL/hr at 10/27/20 0600  . feeding supplement (VITAL AF 1.2 CAL) 1,000 mL (10/27/20 0110)  . heparin 700 Units/hr (10/27/20 0600)  . linezolid (ZYVOX) IV Stopped (10/26/20 2240)  . meropenem (MERREM) IV Stopped (10/26/20 1910)     LOS: 13 days   Time spent: 20min  Johnney Scarlata C Braydn Carneiro, DO [facility]  If 7PM-7AM, please contact night-coverage [email or web address]  10/27/2020, 7:09 AM

## 2020-10-28 ENCOUNTER — Encounter (HOSPITAL_COMMUNITY): Payer: Self-pay | Admitting: Internal Medicine

## 2020-10-28 DIAGNOSIS — A419 Sepsis, unspecified organism: Secondary | ICD-10-CM | POA: Diagnosis not present

## 2020-10-28 DIAGNOSIS — R6520 Severe sepsis without septic shock: Secondary | ICD-10-CM | POA: Diagnosis not present

## 2020-10-28 LAB — RENAL FUNCTION PANEL
Albumin: 2.1 g/dL — ABNORMAL LOW (ref 3.5–5.0)
Albumin: 2 g/dL — ABNORMAL LOW (ref 3.5–5.0)
Anion gap: 12 (ref 5–15)
Anion gap: 15 (ref 5–15)
BUN: 62 mg/dL — ABNORMAL HIGH (ref 8–23)
BUN: 83 mg/dL — ABNORMAL HIGH (ref 8–23)
CO2: 23 mmol/L (ref 22–32)
CO2: 21 mmol/L — ABNORMAL LOW (ref 22–32)
Calcium: 7.6 mg/dL — ABNORMAL LOW (ref 8.9–10.3)
Calcium: 7.8 mg/dL — ABNORMAL LOW (ref 8.9–10.3)
Chloride: 99 mmol/L (ref 98–111)
Chloride: 98 mmol/L (ref 98–111)
Creatinine, Ser: 3.89 mg/dL — ABNORMAL HIGH (ref 0.61–1.24)
Creatinine, Ser: 4.88 mg/dL — ABNORMAL HIGH (ref 0.61–1.24)
GFR, Estimated: 14 mL/min — ABNORMAL LOW (ref >60)
GFR, Estimated: 11 mL/min — ABNORMAL LOW (ref >60)
Glucose, Bld: 103 mg/dL — ABNORMAL HIGH (ref 70–99)
Glucose, Bld: 234 mg/dL — ABNORMAL HIGH (ref 70–99)
Phosphorus: 4.6 mg/dL (ref 2.5–4.6)
Phosphorus: 6.4 mg/dL — ABNORMAL HIGH (ref 2.5–4.6)
Potassium: 3.9 mmol/L (ref 3.5–5.1)
Potassium: 4.6 mmol/L (ref 3.5–5.1)
Sodium: 134 mmol/L — ABNORMAL LOW (ref 135–145)
Sodium: 134 mmol/L — ABNORMAL LOW (ref 135–145)

## 2020-10-28 LAB — CBC
HCT: 21.8 % — ABNORMAL LOW (ref 39.0–52.0)
Hemoglobin: 7.5 g/dL — ABNORMAL LOW (ref 13.0–17.0)
MCH: 34.6 pg — ABNORMAL HIGH (ref 26.0–34.0)
MCHC: 34.4 g/dL (ref 30.0–36.0)
MCV: 100.5 fL — ABNORMAL HIGH (ref 80.0–100.0)
Platelets: 83 10*3/uL — ABNORMAL LOW (ref 150–400)
RBC: 2.17 MIL/uL — ABNORMAL LOW (ref 4.22–5.81)
RDW: 19.9 % — ABNORMAL HIGH (ref 11.5–15.5)
WBC: 55.5 10*3/uL (ref 4.0–10.5)
nRBC: 0.4 % — ABNORMAL HIGH (ref 0.0–0.2)

## 2020-10-28 LAB — GLUCOSE, CAPILLARY
Glucose-Capillary: 70 mg/dL (ref 70–99)
Glucose-Capillary: 117 mg/dL — ABNORMAL HIGH (ref 70–99)
Glucose-Capillary: 131 mg/dL — ABNORMAL HIGH (ref 70–99)
Glucose-Capillary: 235 mg/dL — ABNORMAL HIGH (ref 70–99)
Glucose-Capillary: 165 mg/dL — ABNORMAL HIGH (ref 70–99)

## 2020-10-28 LAB — HEPARIN LEVEL (UNFRACTIONATED)
Heparin Unfractionated: 0.3 IU/mL (ref 0.30–0.70)
Heparin Unfractionated: 0.3 IU/mL (ref 0.30–0.70)

## 2020-10-28 LAB — MAGNESIUM: Magnesium: 2.2 mg/dL (ref 1.7–2.4)

## 2020-10-28 MED ORDER — ACETAMINOPHEN 160 MG/5ML PO SOLN
650.0000 mg | Freq: Four times a day (QID) | ORAL | Status: DC
  Administered 2020-10-28 – 2020-11-07 (×31): 650 mg
  Filled 2020-10-28 (×31): qty 20.3

## 2020-10-28 MED ORDER — B COMPLEX-C PO TABS
1.0000 | ORAL_TABLET | Freq: Every day | ORAL | Status: DC
  Administered 2020-10-28 – 2020-11-10 (×12): 1
  Filled 2020-10-28 (×14): qty 1

## 2020-10-28 MED ORDER — LIDOCAINE 5 % EX PTCH
1.0000 | MEDICATED_PATCH | CUTANEOUS | Status: DC
  Administered 2020-10-29 – 2020-11-09 (×11): 1 via TRANSDERMAL
  Filled 2020-10-28 (×9): qty 1

## 2020-10-28 MED ORDER — ENSURE ENLIVE PO LIQD
237.0000 mL | Freq: Two times a day (BID) | ORAL | Status: DC
  Administered 2020-10-28 – 2020-10-30 (×4): 237 mL

## 2020-10-28 MED ORDER — PREDNISONE 20 MG PO TABS
40.0000 mg | ORAL_TABLET | Freq: Every day | ORAL | Status: DC
  Administered 2020-10-28 – 2020-11-10 (×13): 40 mg
  Filled 2020-10-28 (×13): qty 2

## 2020-10-28 MED ORDER — RESOURCE THICKENUP CLEAR PO POWD
ORAL | Status: DC | PRN
  Filled 2020-10-28: qty 125

## 2020-10-28 MED ORDER — AMIODARONE HCL 200 MG PO TABS
400.0000 mg | ORAL_TABLET | Freq: Two times a day (BID) | ORAL | Status: DC
  Administered 2020-10-28 – 2020-11-10 (×24): 400 mg
  Filled 2020-10-28 (×25): qty 2

## 2020-10-28 NOTE — Progress Notes (Signed)
Sent chat message requesting lidocaine patch for patient.

## 2020-10-28 NOTE — Progress Notes (Signed)
ANTICOAGULATION CONSULT NOTE  Pharmacy Consult:  Heparin Indication:  Afib  Allergies  Allergen Reactions  . Povidone-Iodine Swelling  . Tape Swelling  . Bee Venom Rash  . Covid-19 Mrna Vacc (Moderna) Hives and Rash    Rash came after booster shot. Pt was fine with 1st and 2nd    Patient Measurements: Height: 5\' 8"  (172.7 cm) Weight: 90.9 kg (200 lb 6.4 oz) IBW/kg (Calculated) : 68.4 Heparin Dosing Weight: 87 kg  Vital Signs: Temp: 97.7 F (36.5 C) (01/15 0400) Temp Source: Oral (01/15 0400) BP: 115/56 (01/15 0400) Pulse Rate: 75 (01/15 0400)  Labs: Recent Labs    10/27/20 0429 10/27/20 1803 10/28/20 0151  HGB 6.6* 8.8* 7.5*  HCT 19.9* 26.6* 21.8*  PLT 127* 87* 83*  HEPARINUNFRC 0.24* 0.43 0.30  CREATININE 4.47* 2.94* 3.89*    Estimated Creatinine Clearance: 14.6 mL/min (A) (by C-G formula based on SCr of 3.89 mg/dL (H)).   Assessment: 76 YOM with Covid.  Pharmacy dosing IV heparin for history of Afib while Eliquis is on hold.    Heparin level is subtherapeutic this morning at 0.3. No issue with heparin infusion or bleeding noted per RN.  Hemoglobin down to 7.5, platelets 83.     Goal of Therapy:  Heparin level 0.3-0.7 units/ml Monitor platelets by anticoagulation protocol: Yes    Plan:  Increase heparin infusion to 900 units/hr Check 8 hr heparin level Daily heparin level and CBC MD to advise on [facility] plans  Thank you for allowing [place] to participate in this patients care.  Claudina Lick, PharmD PGY1 Acute Care Pharmacy Resident 10/28/2020 7:24 AM  Please check [email or web address] for unit-specific pharmacy phone numbers.

## 2020-10-28 NOTE — Progress Notes (Signed)
PROGRESS NOTE    Raymond Hurley.  YIR:[id number] DOB: [birth year] DOA: 10/14/2020 PCP: Lajean Manes, MD   Brief Narrative:  87 year old man who is hypotensive in the context of acute diarrhea on 1/1 was admitted w/ dx of COVID and acute dehydration from acute diarrheal illness. Of note he was fully vaccinated and had received his booster. Given worsening delirium, multiple organ failure, and profound hypotension with CT abdomen showing pancreatitis.  Given advanced age and multiple organ dysfunction in addition to concern about further clinical decline patient was transitioned to critical care team.  1/4 > hypotension and A.fib RVR.  Transferring to ICU. Started CRRT 1/7 > Worsening leukocytosis.  CT abdomen pelvis with severe pancreatitis, possible iliopsoas abscess.  Deemed too unstable for intervention per surgery and interventional radiology 1/8 > started pressors.  Antibiotics broadened to meropenem and vancomycin.  Made DNR 1/12 > stopped CRRT 1/13 > iHD 1/14 > passed speech evaluation, tolerating p.o. well, NG tube removed  Assessment & Plan:   Principal Problem:   Severe sepsis ([place]) Active Problems:   PAF (paroxysmal atrial fibrillation) (HCC)   Anticoagulated   Pneumonia due to COVID-19 virus   Chronic systolic CHF (congestive heart failure) (HCC)   Elevated troponin   Facial weakness   Hypocalcemia   Macrocytic anemia   Acute metabolic encephalopathy   Acute respiratory failure (HCC)   AKI (acute kidney injury) ([place])   Dehydration   Persistent atrial fibrillation (HCC)   Acute hypoxic respiratory failure 2/2 aspiration PNA, improved  Cannot rule out concurrent COVID-19 infection  -Continue to wean O2 as tolerated -Continue early ambulation, incentive spirometry, flutter - continue steroids - wean as tolelrated  Septic shock, resolved Possible iliac abscess -Previous team discussed with IR/CCS on 1/7, no surgery or drain indicated -stress dose steroids  off  -cont mero, zyvox for antibiotic coverage, likely prolonged course given no clear ability to drain infection  Acute metabolic encephalopathy, multifactorial: In the setting of uremia, septic shock, pancreatitis, anemia and COVID-19 infection Resolved - Continue supportive care, delirium precautions - Mental status appears to be improving drastically ANOx4 this morning; very appropriate questions  Acute renal failure requiring RRT  Hypophosphatemia, mild - Suspect secondary to ATN after recent diarrheal illness and volume depletion in the setting of profound disease, septic shock, COVID infection - Nephro following - CRRT clotted off 10/25/20 - renal aware suspect transition to iHD as of 10/27/20  Acute pancreatitis, resolving - Continue pain control, supportive care, leukocytosis still markedly elevated - NG food removed 10/28/20 - tolerate PO well, passed SLP eval  Afib, likely paroxysmal; questionably provoked Systolic cardiomyopathy Echo 1/3 with EF 55 - 60% - In and out of A. fib over the hospitalization  - Cont hep gtt per pharm for now -hoping to be more rate controlled after acute illness has resolved, unclear if patient will need prolonged anticoagulation given likely provoked nature of A. fib - Continue PO amio  Acute anemia requiring transfusion; Likely in the setting of kidney disease, profound dehydration and acute illness Rule out acute blood loss anemia - Transfusion 1/12 and 1/13 overnight - Likely iatrogenic from blood draws, losses in CRRT circuit. Likely component of critical illness anemia, underlying chronic anemia - Scant blood noted at rectal tube today, appears to be irritation and not profound GI bleed, unlikely source of patient's anemia but will continue to follow - Goal Hgb > 7   DVT prophylaxis: hep gtt  Code Status: DNR Family Communication: Updated  multiple family members over video chat at bedside today (Wife/children).  Status is:  Inpatient  Dispo: The patient is from: Home              Anticipated d/c is to: To be determined              Anticipated d/c date is: > 72 hours              Patient currently not medically stable for discharge  Consultants:   PCCM, nephrology, GI, cardiology, neurology  Antimicrobials:  Unasyn 1/2 >> 1/4 Cefepime 1/4 >> 1/7 Flagyl 1/6 >> 1/7 Vancomycin 1/8 > 1/11  Meropenem 1/7 >> ongoing Zyvox 1/12>  ongoing  Subjective: No acute issues or events overnight, patient feeling markedly improved this morning, tolerating PT today much more appropriately than previous.  Denies nausea, vomiting, constipation, headache, fevers, chills.  Objective: Vitals:   10/28/20 0200 10/28/20 0330 10/28/20 0400 10/28/20 0723  BP: (!) 112/54 (!) 110/53 (!) 115/56 (!) 114/49  Pulse: 75 75 75 72  Resp: 16 (!) 21 16 14   Temp:  97.7 F (36.5 C) 97.7 F (36.5 C) 98.4 F (36.9 C)  TempSrc:  Oral Oral Oral  SpO2: 94% 94% 95% 92%  Weight:  90.9 kg    Height:  5\' 8"  (1.727 m)      Intake/Output Summary (Last 24 hours) at 10/28/2020 0804 Last data filed at 10/28/2020 0200 Gross per 24 hour  Intake 2124.34 ml  Output 2000 ml  Net 124.34 ml   Filed Weights   10/27/20 0400 10/27/20 1345 10/28/20 0330  Weight: 90.9 kg 90.9 kg 90.9 kg    Examination:  General: Elderly frail gentleman sitting up in bed HEENT: Multiple excoriations and scabs to the face, NG tube secured.  Sclerae nonicteric, noninjected.  Extraocular movements intact bilaterally. Neck: Right IJ dialysis access noted, bandage clean dry intact. Lungs: Diminished bilaterally without overt rhonchi, wheeze, or rales. Heart:  Regular rate and rhythm.  Without murmurs, rubs, or gallops. Abdomen:  Soft, diffusely tender no PMI, minimally distended.  Without guarding or rebound. Extremities: Without cyanosis, clubbing, edema, or obvious deformity. Vascular:  Dorsalis pedis and posterior tibial pulses palpable bilaterally. Skin:  Warm  and dry, no erythema, no ulcerations.  Data Reviewed: I have personally reviewed following labs and imaging studies  CBC: Recent Labs  Lab 10/25/20 0342 10/25/20 1840 10/26/20 0152 10/27/20 0429 10/27/20 1803 10/28/20 0151  WBC 72.7*  --  66.5* 54.7* 64.4* 55.5*  HGB 6.9* 7.6* 7.7* 6.6* 8.8* 7.5*  HCT 21.6* 22.6* 22.7* 19.9* 26.6* 21.8*  MCV 111.3*  --  103.7* 105.3* 100.4* 100.5*  PLT 169  --  158 127* 87* 83*   Basic Metabolic Panel: Recent Labs  Lab 10/24/20 0612 10/24/20 1624 10/25/20 0342 10/25/20 1559 10/26/20 0152 10/26/20 1542 10/27/20 0429 10/27/20 1803 10/28/20 0151  NA 137   < > 136   < > 136 134* 135 136 134*  K 3.6   < > 3.8   < > 3.9 4.2 4.2 4.2 3.9  CL 101   < > 101   < > 101 100 100 99 99  CO2 25   < > 25   < > 24 21* 22 24 23   GLUCOSE 127*   < > 157*   < > 111* 199* 121* 194* 103*  BUN 27*   < > 28*   < > 32* 59* 77* 45* 62*  CREATININE 1.63*   < > 1.64*   < >  1.89* 3.30* 4.47* 2.94* 3.89*  CALCIUM 7.7*   < > 8.1*   < > 8.1* 7.6* 8.1* 7.8* 7.6*  MG 2.6*  --  2.5*  --  2.6*  --  2.7*  --  2.2  PHOS 3.4   < > 2.1*   < > 2.1* 3.2 5.1* 3.7 4.6   < > = values in this interval not displayed.   GFR: Estimated Creatinine Clearance: 14.6 mL/min (A) (by C-G formula based on SCr of 3.89 mg/dL (H)). Liver Function Tests: Recent Labs  Lab 10/25/20 0342 10/25/20 1559 10/26/20 0152 10/26/20 1542 10/27/20 0429 10/27/20 1803 10/28/20 0151  AST 47*  --   --   --   --   --   --   ALT 53*  --   --   --   --   --   --   ALKPHOS 225*  --   --   --   --   --   --   BILITOT 0.7  --   --   --   --   --   --   PROT 5.3*  --   --   --   --   --   --   ALBUMIN 2.5*   < > 2.4* 2.3* 2.2* 2.3* 2.1*   < > = values in this interval not displayed.   No results for input(s): LIPASE, AMYLASE in the last 168 hours. No results for input(s): AMMONIA in the last 168 hours. Coagulation Profile: No results for input(s): INR, PROTIME in the last 168 hours. Cardiac  Enzymes: No results for input(s): CKTOTAL, CKMB, CKMBINDEX, TROPONINI in the last 168 hours. BNP (last 3 results) No results for input(s): PROBNP in the last 8760 hours. HbA1C: No results for input(s): HGBA1C in the last 72 hours. CBG: Recent Labs  Lab 10/27/20 1601 10/27/20 1918 10/27/20 2357 10/28/20 0427 10/28/20 0724  GLUCAP 169* 202* 128* 70 117*   Lipid Profile: No results for input(s): CHOL, HDL, LDLCALC, TRIG, CHOLHDL, LDLDIRECT in the last 72 hours. Thyroid Function Tests: No results for input(s): TSH, T4TOTAL, FREET4, T3FREE, THYROIDAB in the last 72 hours. Anemia Panel: No results for input(s): VITAMINB12, FOLATE, FERRITIN, TIBC, IRON, RETICCTPCT in the last 72 hours. Sepsis Labs: No results for input(s): PROCALCITON, LATICACIDVEN in the last 168 hours.  Recent Results (from the past 240 hour(s))  Culture, blood (Routine X 2) w Reflex to ID Panel     Status: None   Collection Time: 10/20/20  4:40 PM   Specimen: BLOOD RIGHT HAND  Result Value Ref Range Status   Specimen Description BLOOD RIGHT HAND  Final   Special Requests   Final    BOTTLES DRAWN AEROBIC AND ANAEROBIC Blood Culture adequate volume   Culture   Final    NO GROWTH 5 DAYS Performed at [facility], [address] [place], [place] [address]    Report Status 10/25/2020 FINAL  Final  Culture, blood (Routine X 2) w Reflex to ID Panel     Status: None   Collection Time: 10/20/20  4:43 PM   Specimen: BLOOD  Result Value Ref Range Status   Specimen Description BLOOD LEFT ANTECUBITAL  Final   Special Requests   Final    BOTTLES DRAWN AEROBIC ONLY Blood Culture results may not be optimal due to an inadequate volume of blood received in culture bottles   Culture   Final    NO GROWTH 5 DAYS Performed  at [facility], [place] [address], [place], [place] [address]    Report Status 10/25/2020 FINAL  Final  MRSA PCR Screening     Status: None   Collection Time: 10/21/20 10:08 AM   Specimen:  Nasal Mucosa; Nasopharyngeal  Result Value Ref Range Status   MRSA by PCR NEGATIVE NEGATIVE Final    Comment:        The GeneXpert MRSA Assay (FDA approved for NASAL specimens only), is one component of a comprehensive MRSA colonization surveillance program. It is not intended to diagnose MRSA infection nor to guide or monitor treatment for MRSA infections. Performed at [facility], [place] [address], [place], [place] [address]     Radiology Studies: No results found.  Scheduled Meds: . acetaminophen (TYLENOL) oral liquid 160 mg/5 mL  650 mg Per Tube Q6H  . amiodarone  400 mg Per Tube BID  . B-complex with vitamin C  1 tablet Per Tube Daily  . Chlorhexidine Gluconate Cloth  6 each Topical Q1200  . feeding supplement  237 mL Per Tube BID BM  . Gerhardt's butt cream   Topical TID  . insulin aspart  3-9 Units Subcutaneous Q4H  . mouth rinse  15 mL Mouth Rinse BID  . phenol  1 spray Mouth/Throat Once  . predniSONE  40 mg Per Tube Q breakfast  . sodium chloride flush  10-40 mL Intracatheter Q12H  . sodium chloride flush  3 mL Intravenous Q12H  . triamcinolone 0.1 % cream : eucerin   Topical BID   Continuous Infusions: . sodium chloride Stopped (10/27/20 0931)  . sodium chloride    . sodium chloride    . feeding supplement (VITAL AF 1.2 CAL) 1,000 mL (10/27/20 0110)  . heparin 800 Units/hr (10/28/20 0717)  . linezolid (ZYVOX) IV Stopped (10/27/20 2348)  . meropenem (MERREM) IV Stopped (10/27/20 2224)     LOS: 14 days   Time spent: 65min  Lewanna Petrak C Temperance Kelemen, DO [facility]  If 7PM-7AM, please contact night-coverage [email or web address]  10/28/2020, 8:04 AM

## 2020-10-28 NOTE — Progress Notes (Signed)
[facility] Progress Note    Assessment/ Plan:   1. Acute renal failure - baseline cr 1.2 in June 2021, now with severe AKI in the setting of multiple insults including COVID, diarrhea, and pancreatitis (also was taking entresto).  Severe AKI with Cr up to 8.6, falling UOP 1. CRRT 1/4-1/12. S/p IHD yesterday, was able to get him done. Next HD tentatively planned for Monday 2. Would really recommend palliative addressing his overall GOC. My concern is his long-term candidacy for dialysis. There are no signs of renal recovery at the moment. 3. Avoid nephrotoxic medications including NSAIDs and iodinated intravenous contrast exposure unless the latter is absolutely indicated.  Preferred narcotic agents for pain control are hydromorphone, fentanyl, and methadone. Morphine should not be used. Avoid Baclofen and avoid oral sodium phosphate and magnesium citrate based laxatives / bowel preps. Continue strict Input and Output monitoring. Will monitor the patient closely with you and intervene or adjust therapy as indicated by changes in clinical status/labs  2. Acute pancreatitis: Noted on CT scan 10/16/20 and again on 1/7. 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 Mgmt per primary service  3. Acute hypoxemic RF: likely aspiration PNA +/- COVID pneumonitis, stable o2 req. steroids  4. Shock: high WBC count, w/u per primary team. Possible ilacus abscess on CT from 1/7. Off levophed 1/11. Mero, zyvox  5. COVID-19 infection: vaccinated and boosted- treatment per primary  6. Acute encephalopathy: multifactorial, likely COVID + acute illness. CTM  8.  Chronic systolic CHF: EF 01/19/08 on TTE 60-65% with no WMA.  9.  Afib: hep and amiodarone po    Subjective:    Due to the COVID pandemic, in attempts to limit transmission and over-utilization of resources (e.g. PPE), the patient was seen virtually today by means of  chart review, discussion with staff, and virtual discussion with patient as needed.  No acute events, no issues with HD yesterday, net uf 2.5L. Anuric.   Objective:   BP (!) 99/45 (BP Location: Left Arm)   Pulse 79   Temp 97.6 F (36.4 C) (Oral)   Resp 16   Ht 5\' 8"  (1.727 m)   Wt 90.9 kg   SpO2 94%   BMI 30.47 kg/m   Intake/Output Summary (Last 24 hours) at 10/28/2020 1335 Last data filed at 10/28/2020 0200 Gross per 24 hour  Intake 923.86 ml  Output 2000 ml  Net -1076.14 ml   Weight change: 0 kg  Physical Exam:    Not examined  Imaging: No results found.  Labs: BMET Recent Labs  Lab 10/25/20 0342 10/25/20 1559 10/26/20 0152 10/26/20 1542 10/27/20 0429 10/27/20 1803 10/28/20 0151  NA 136 137 136 134* 135 136 134*  K 3.8 3.9 3.9 4.2 4.2 4.2 3.9  CL 101 101 101 100 100 99 99  CO2 25 25 24  21* 22 24 23   GLUCOSE 157* 127* 111* 199* 121* 194* 103*  BUN 28* 30* 32* 59* 77* 45* 62*  CREATININE 1.64* 1.71* 1.89* 3.30* 4.47* 2.94* 3.89*  CALCIUM 8.1* 8.2* 8.1* 7.6* 8.1* 7.8* 7.6*  PHOS 2.1* 2.2* 2.1* 3.2 5.1* 3.7 4.6   CBC Recent Labs  Lab 10/26/20 0152 10/27/20 0429 10/27/20 1803 10/28/20 0151  WBC 66.5* 54.7* 64.4* 55.5*  HGB 7.7* 6.6* 8.8* 7.5*  HCT 22.7* 19.9* 26.6* 21.8*  MCV 103.7* 105.3* 100.4* 100.5*  PLT 158 127* 87* 83*    Medications:    . acetaminophen (  TYLENOL) oral liquid 160 mg/5 mL  650 mg Per Tube Q6H  . amiodarone  400 mg Per Tube BID  . B-complex with vitamin C  1 tablet Per Tube Daily  . Chlorhexidine Gluconate Cloth  6 each Topical Q1200  . feeding supplement  237 mL Per Tube BID BM  . Gerhardt's butt cream   Topical TID  . insulin aspart  3-9 Units Subcutaneous Q4H  . mouth rinse  15 mL Mouth Rinse BID  . phenol  1 spray Mouth/Throat Once  . predniSONE  40 mg Per Tube Q breakfast  . sodium chloride flush  10-40 mL Intracatheter Q12H  . sodium chloride flush  3 mL Intravenous Q12H  . triamcinolone 0.1 % cream : eucerin    Topical BID      Gean Quint  10/28/2020, 1:35 PM

## 2020-10-28 NOTE — Progress Notes (Signed)
Patient arrived to unit from 57M via bed.  Alert and oriented x4.  On room air, no complaints of pain at this time.  Heparin infusing without difficulty.  Oriented to room, placed on bedside monitor. Belongings at bedside. Call light within reach, told to call with any needs.  Bed alarm on.

## 2020-10-28 NOTE — Progress Notes (Signed)
ANTICOAGULATION CONSULT NOTE  Pharmacy Consult: Heparin Indication:  Afib  Allergies  Allergen Reactions  . Povidone-Iodine Swelling  . Tape Swelling  . Bee Venom Rash  . Covid-19 Mrna Vacc (Moderna) Hives and Rash    Rash came after booster shot. Pt was fine with 1st and 2nd    Patient Measurements: Height: 5\' 8"  (172.7 cm) Weight: 90.9 kg (200 lb 6.4 oz) IBW/kg (Calculated) : 68.4 Heparin Dosing Weight: 87 kg  Vital Signs: Temp: 97.6 F (36.4 C) (01/15 1218) Temp Source: Oral (01/15 1218) BP: 99/45 (01/15 1218) Pulse Rate: 79 (01/15 1218)  Labs: Recent Labs    10/27/20 0429 10/27/20 1803 10/28/20 0151 10/28/20 1559  HGB 6.6* 8.8* 7.5*  --   HCT 19.9* 26.6* 21.8*  --   PLT 127* 87* 83*  --   HEPARINUNFRC 0.24* 0.43 0.30 0.30  CREATININE 4.47* 2.94* 3.89*  --     Estimated Creatinine Clearance: 14.6 mL/min (A) (by C-G formula based on SCr of 3.89 mg/dL (H)).   Assessment: 75 YOM with Covid. Pharmacy dosing IV heparin for history of Afib while Eliquis is on hold.    Heparin level continues to be at goal (0.3) this evening. No issue with heparin infusion or bleeding noted per RN.  Hemoglobin down to 7.5, platelets 83 as reported this morning.   Goal of Therapy:  Heparin level 0.3-0.7 units/ml Monitor platelets by anticoagulation protocol: Yes    Plan:  Increase heparin infusion to 1000 units/hr to keep within range Daily heparin level and CBC MD to advise on [facility] plans  Thank you for allowing [place] to participate in this patients care.  Erin Hearing PharmD., BCPS Clinical Pharmacist 10/28/2020 4:47 PM   Please check [email or web address] for unit-specific pharmacy phone numbers.

## 2020-10-29 DIAGNOSIS — A419 Sepsis, unspecified organism: Secondary | ICD-10-CM | POA: Diagnosis not present

## 2020-10-29 DIAGNOSIS — R6520 Severe sepsis without septic shock: Secondary | ICD-10-CM | POA: Diagnosis not present

## 2020-10-29 LAB — MAGNESIUM: Magnesium: 2.5 mg/dL — ABNORMAL HIGH (ref 1.7–2.4)

## 2020-10-29 LAB — RENAL FUNCTION PANEL
Albumin: 1.9 g/dL — ABNORMAL LOW (ref 3.5–5.0)
Albumin: 1.9 g/dL — ABNORMAL LOW (ref 3.5–5.0)
Anion gap: 16 — ABNORMAL HIGH (ref 5–15)
Anion gap: 16 — ABNORMAL HIGH (ref 5–15)
BUN: 95 mg/dL — ABNORMAL HIGH (ref 8–23)
BUN: 107 mg/dL — ABNORMAL HIGH (ref 8–23)
CO2: 20 mmol/L — ABNORMAL LOW (ref 22–32)
CO2: 17 mmol/L — ABNORMAL LOW (ref 22–32)
Calcium: 7.7 mg/dL — ABNORMAL LOW (ref 8.9–10.3)
Calcium: 7.4 mg/dL — ABNORMAL LOW (ref 8.9–10.3)
Chloride: 97 mmol/L — ABNORMAL LOW (ref 98–111)
Chloride: 99 mmol/L (ref 98–111)
Creatinine, Ser: 5.67 mg/dL — ABNORMAL HIGH (ref 0.61–1.24)
Creatinine, Ser: 6.31 mg/dL — ABNORMAL HIGH (ref 0.61–1.24)
GFR, Estimated: 9 mL/min — ABNORMAL LOW (ref >60)
GFR, Estimated: 8 mL/min — ABNORMAL LOW (ref >60)
Glucose, Bld: 125 mg/dL — ABNORMAL HIGH (ref 70–99)
Glucose, Bld: 205 mg/dL — ABNORMAL HIGH (ref 70–99)
Phosphorus: 8.5 mg/dL — ABNORMAL HIGH (ref 2.5–4.6)
Phosphorus: 9.8 mg/dL — ABNORMAL HIGH (ref 2.5–4.6)
Potassium: 4.9 mmol/L (ref 3.5–5.1)
Potassium: 5.2 mmol/L — ABNORMAL HIGH (ref 3.5–5.1)
Sodium: 133 mmol/L — ABNORMAL LOW (ref 135–145)
Sodium: 132 mmol/L — ABNORMAL LOW (ref 135–145)

## 2020-10-29 LAB — HEMOGLOBIN AND HEMATOCRIT, BLOOD
HCT: 19.8 % — ABNORMAL LOW (ref 39.0–52.0)
HCT: 22.5 % — ABNORMAL LOW (ref 39.0–52.0)
Hemoglobin: 6.4 g/dL — CL (ref 13.0–17.0)
Hemoglobin: 7.2 g/dL — ABNORMAL LOW (ref 13.0–17.0)

## 2020-10-29 LAB — PREPARE RBC (CROSSMATCH)

## 2020-10-29 LAB — CBC
HCT: 18.1 % — ABNORMAL LOW (ref 39.0–52.0)
Hemoglobin: 6 g/dL — CL (ref 13.0–17.0)
MCH: 33.9 pg (ref 26.0–34.0)
MCHC: 33.1 g/dL (ref 30.0–36.0)
MCV: 102.3 fL — ABNORMAL HIGH (ref 80.0–100.0)
Platelets: 82 10*3/uL — ABNORMAL LOW (ref 150–400)
RBC: 1.77 MIL/uL — ABNORMAL LOW (ref 4.22–5.81)
RDW: 20 % — ABNORMAL HIGH (ref 11.5–15.5)
WBC: 57.3 10*3/uL (ref 4.0–10.5)
nRBC: 0.2 % (ref 0.0–0.2)

## 2020-10-29 LAB — GLUCOSE, CAPILLARY
Glucose-Capillary: 105 mg/dL — ABNORMAL HIGH (ref 70–99)
Glucose-Capillary: 129 mg/dL — ABNORMAL HIGH (ref 70–99)
Glucose-Capillary: 144 mg/dL — ABNORMAL HIGH (ref 70–99)
Glucose-Capillary: 134 mg/dL — ABNORMAL HIGH (ref 70–99)
Glucose-Capillary: 160 mg/dL — ABNORMAL HIGH (ref 70–99)
Glucose-Capillary: 172 mg/dL — ABNORMAL HIGH (ref 70–99)

## 2020-10-29 LAB — HEPARIN LEVEL (UNFRACTIONATED): Heparin Unfractionated: 0.48 IU/mL (ref 0.30–0.70)

## 2020-10-29 MED ORDER — SODIUM CHLORIDE 0.9% IV SOLUTION: Freq: Once | INTRAVENOUS | Status: AC

## 2020-10-29 MED ORDER — SODIUM CHLORIDE 0.9% IV SOLUTION
Freq: Once | INTRAVENOUS | Status: AC
  Administered 2020-10-29: 10 mL via INTRAVENOUS

## 2020-10-29 MED ORDER — SODIUM CHLORIDE 0.9 % IV BOLUS
1000.0000 mL | Freq: Once | INTRAVENOUS | Status: AC
  Administered 2020-10-29: 1000 mL via INTRAVENOUS

## 2020-10-29 NOTE — Progress Notes (Signed)
[facility] Progress Note    Assessment/ Plan:   1. Acute renal failure - baseline cr 1.2 in June 2021, now with severe AKI in the setting of multiple insults including COVID, diarrhea, and pancreatitis (also was taking entresto).  Severe AKI with Cr up to 8.6, falling UOP 1. CRRT 1/4-1/12. S/p IHD Friday. Next HD tentatively planned for Monday 2. Would really recommend palliative addressing his overall GOC. My concern is his long-term candidacy for dialysis. There are no signs of renal recovery at the moment. 3. Avoid nephrotoxic medications including NSAIDs and iodinated intravenous contrast exposure unless the latter is absolutely indicated.  Preferred narcotic agents for pain control are hydromorphone, fentanyl, and methadone. Morphine should not be used. Avoid Baclofen and avoid oral sodium phosphate and magnesium citrate based laxatives / bowel preps. Continue strict Input and Output monitoring. Will monitor the patient closely with you and intervene or adjust therapy as indicated by changes in clinical status/labs  2. Acute pancreatitis: Noted on CT scan 10/16/20 and again on 1/7. 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 Mgmt per primary service  3. Acute hypoxemic RF: likely aspiration PNA +/- COVID pneumonitis, stable o2 req. steroids  4. Shock: high WBC count, w/u per primary team. Possible ilacus abscess on CT from 1/7. Off levophed 1/11. Mero, zyvox  5. COVID-19 infection: vaccinated and boosted- treatment per primary  6. Acute encephalopathy: multifactorial, likely COVID + acute illness. CTM  8.  Chronic systolic CHF: EF 02/14/28 on TTE 60-65% with no WMA.  9.  Afib: hep and amiodarone po  10. Acute anemia -1u prbc today, possible BRBPR, hep gtt, per primary service    Subjective:    Due to the COVID pandemic, in attempts to limit transmission and over-utilization of resources (e.g.  PPE), the patient was seen virtually today by means of chart review, discussion with staff, and virtual discussion with patient as needed.  Anemic, 1u prbc ordered overnight, brbpr noted (around flexiseal). uop not charted but has been anuric   Objective:   BP (!) 94/50 (BP Location: Left Arm)   Pulse 78   Temp 98 F (36.7 C) (Oral)   Resp 18   Ht 5\' 8"  (1.727 m)   Wt 94.8 kg   SpO2 96%   BMI 31.78 kg/m   Intake/Output Summary (Last 24 hours) at 10/29/2020 1212 Last data filed at 10/29/2020 0600 Gross per 24 hour  Intake 1265.45 ml  Output 100 ml  Net 1165.45 ml   Weight change: 3.9 kg  Physical Exam:    Not examined  Imaging: No results found.  Labs: BMET Recent Labs  Lab 10/26/20 0152 10/26/20 1542 10/27/20 0429 10/27/20 1803 10/28/20 0151 10/28/20 1559 10/29/20 0313  NA 136 134* 135 136 134* 134* 133*  K 3.9 4.2 4.2 4.2 3.9 4.6 4.9  CL 101 100 100 99 99 98 97*  CO2 24 21* 22 24 23  21* 20*  GLUCOSE 111* 199* 121* 194* 103* 234* 125*  BUN 32* 59* 77* 45* 62* 83* 95*  CREATININE 1.89* 3.30* 4.47* 2.94* 3.89* 4.88* 5.67*  CALCIUM 8.1* 7.6* 8.1* 7.8* 7.6* 7.8* 7.7*  PHOS 2.1* 3.2 5.1* 3.7 4.6 6.4* 8.5*   CBC Recent Labs  Lab 10/27/20 0429 10/27/20 1803 10/28/20 0151 10/29/20 0313  WBC 54.7* 64.4* 55.5* 57.3*  HGB 6.6* 8.8* 7.5* 6.0*  HCT 19.9* 26.6* 21.8* 18.1*  MCV 105.3* 100.4* 100.5* 102.3*  PLT 127*  87* 83* 82*    Medications:    . acetaminophen (TYLENOL) oral liquid 160 mg/5 mL  650 mg Per Tube Q6H  . amiodarone  400 mg Per Tube BID  . B-complex with vitamin C  1 tablet Per Tube Daily  . Chlorhexidine Gluconate Cloth  6 each Topical Q1200  . feeding supplement  237 mL Per Tube BID BM  . Gerhardt's butt cream   Topical TID  . insulin aspart  3-9 Units Subcutaneous Q4H  . lidocaine  1 patch Transdermal Q24H  . mouth rinse  15 mL Mouth Rinse BID  . phenol  1 spray Mouth/Throat Once  . predniSONE  40 mg Per Tube Q breakfast  . sodium  chloride flush  10-40 mL Intracatheter Q12H  . sodium chloride flush  3 mL Intravenous Q12H  . triamcinolone 0.1 % cream : eucerin   Topical BID      Amori Cooperman  10/29/2020, 12:12 PM

## 2020-10-29 NOTE — Progress Notes (Signed)
Called MD on call HgB down to 6, HOTN into the 09O systolic;  1 L NS bolus administered per order,  1 unit of PRBC infusing.  Raymond Hurley has obvious signs of blood in Flexiseal pt also reports abdomen swollen.  Dr. Ree Kida of all the findings and Heparin gtt was cancelled

## 2020-10-29 NOTE — Progress Notes (Signed)
Sent chat msg to Dr. Avon Gully: I had to track down phlebotomy and they just came....hgb 6.4 so it only went up .4 There is not an order for the 2nd unit. Will you please order?

## 2020-10-29 NOTE — Progress Notes (Signed)
ANTICOAGULATION & ANTIBIOTIC CONSULT NOTE  Pharmacy Consult:  Merrem Indication: Right psoas abscess  Allergies  Allergen Reactions  . Povidone-Iodine Swelling  . Tape Swelling  . Bee Venom Rash  . Covid-19 Mrna Vacc (Moderna) Hives and Rash    Rash came after booster shot. Pt was fine with 1st and 2nd    Patient Measurements: Height: 5\' 8"  (172.7 cm) Weight: 94.8 kg (208 lb 15.9 oz) IBW/kg (Calculated) : 68.4  Vital Signs: Temp: 98.8 F (37.1 C) (01/16 1216) Temp Source: Axillary (01/16 1216) BP: 88/48 (01/16 1216) Pulse Rate: 73 (01/16 1216)  Labs: Recent Labs    10/27/20 1803 10/28/20 0151 10/28/20 1559 10/29/20 0313  HGB 8.8* 7.5*  --  6.0*  HCT 26.6* 21.8*  --  18.1*  PLT 87* 83*  --  82*  HEPARINUNFRC 0.43 0.30 0.30 0.48  CREATININE 2.94* 3.89* 4.88* 5.67*    Estimated Creatinine Clearance: 10.3 mL/min (A) (by C-G formula based on SCr of 5.67 mg/dL (H)).    Assessment: 83 YOM with Covid.  Pharmacy dosing Merrem for right psoas abscess. He is on Zyvox as well.  Afebrile, WBC remains elevated but trending down. Patient is currently off CRRT. Currently on day 6 of linezolid and day 10 of meropenem for prolonged course of IV antibiotics due to unclear ability to drain infection. Goals of care discussion with patinet's familiy ongoing.   1/2 Unasyn >>1/4 1/2 Doxy >> 1/2 1/4 cefepime >> 1/7 1/6 flagyl >>1/7 1/7 meropenem >> Vanc 1/8>>1/11 Zyvox 1/11 >>  1/8 MRSA PCR - negative 1/7 BCx - negative 1/2 MRSA PCR -negative 1/1 BCx - negative  Goal of Therapy:  Clearance of infection   Plan:  Continue Zyvox 600mg  IV Q12H Continue Merrem 500mg  IV Q24H F/u dialysis plans, clinical progress  Claudina Lick, PharmD PGY1 Acute Care Pharmacy Resident 10/29/2020 2:05 PM  Please check [email or web address] for unit-specific pharmacy phone numbers.

## 2020-10-29 NOTE — Progress Notes (Addendum)
PROGRESS NOTE    Raymond Hurley.  YFV:[id number] DOB: 1931-04-07 DOA: 10/14/2020 PCP: Lajean Manes, MD   Brief Narrative:  87 year old man who is hypotensive in the context of acute diarrhea on 1/1 was admitted w/ dx of COVID and acute dehydration from acute diarrheal illness. Of note he was fully vaccinated and had received his booster. Given worsening delirium, multiple organ failure, and profound hypotension with CT abdomen showing pancreatitis.  Given advanced age and multiple organ dysfunction in addition to concern about further clinical decline patient was transitioned to critical care team.  1/4 > hypotension and A.fib RVR.  Transferring to ICU. Started CRRT 1/7 > Worsening leukocytosis.  CT abdomen pelvis with severe pancreatitis, possible iliopsoas abscess.  Deemed too unstable for intervention per surgery and interventional radiology 1/8 > started pressors.  Antibiotics broadened to meropenem and vancomycin.  Made DNR 1/12 > stopped CRRT 1/13 > iHD 1/14-15 > passed speech evaluation, tolerating p.o. well, NG tube removed 1/16 > acutely overnight noted to have hematochezia, hemoglobin 6.0, -Heparin drip stopped, hypotensive, concern for worsening clinical deterioration remains high risk for decompensation   **ADDENDUM - repeat Hgb still low (6.4) - add 1 additional unit PRBC this afternoon and continue to follow labs post-transfusion   Assessment & Plan:   Principal Problem:   Severe sepsis ([place]) Active Problems:   PAF (paroxysmal atrial fibrillation) (HCC)   Anticoagulated   Pneumonia due to COVID-19 virus   Chronic systolic CHF (congestive heart failure) (HCC)   Elevated troponin   Facial weakness   Hypocalcemia   Macrocytic anemia   Acute metabolic encephalopathy   Acute respiratory failure (HCC)   AKI (acute kidney injury) ([place])   Dehydration   Persistent atrial fibrillation ([place])  Goals of care discussion  -Lengthy discussion with patient's son and  daughter-in-law over the phone, we discussed given patient's now acute bleeding in the setting of previous comorbid conditions he remains very high risk for decompensation -Patient's blood pressure is also quite low today despite fluid challenges -We discussed that at this point given patient's anemia hypotension, COVID-19 pneumonia, abdominal infection and pancreatitis at his advanced age he remains extremely high risk for decompensation -Unfortunately patient has somewhat acute renal failure as well making volume status somewhat tenuous, somewhat limited in volume resuscitation given patient continues to require hemodialysis, given his age long-term hemodialysis will likely be limited in and of itself. -Palliative care following, pending the next 24 to 48 hours will likely need to discuss with family goals of care including but not limited to palliative care and hospice should patient continue to worsen  Acute blood loss anemia requiring transfusion; Concurrent anemia of kidney disease, profound dehydration and acute illness - Transfusion 1/12, 1/13, 116 overnight - Patient now having hematochezia, melena via rectal tube -Heparin drip discontinued - Goal Hgb > 7; repeat H&H this afternoon pending, if he remains anemic will order 1 additional unit of blood  Acute hypoxic respiratory failure 2/2 aspiration PNA, improved  Cannot rule out concurrent COVID-19 infection  -Continue to wean O2 as tolerated -Continue early ambulation, incentive spirometry, flutter - continue steroids - wean as tolelrated  Septic shock, resolved Possible iliac abscess -Previous team discussed with IR/CCS on 1/7, no surgery or drain indicated -stress dose steroids off  -cont mero, zyvox for antibiotic coverage, likely prolonged course given no clear ability to drain infection  Acute metabolic encephalopathy, multifactorial: In the setting of uremia, septic shock, pancreatitis, anemia and COVID-19  infection Resolved -  Continue supportive care, delirium precautions - Mental status appears to be improving drastically ANOx4 this morning; very appropriate questions  Acute renal failure requiring RRT  Hypophosphatemia, mild - Suspect secondary to ATN after recent diarrheal illness and volume depletion in the setting of profound disease, septic shock, COVID infection - Nephro following - CRRT clotted off 10/25/20 - renal aware suspect transition to iHD as of 10/27/20  Acute pancreatitis, resolving - Continue pain control, supportive care, leukocytosis still markedly elevated - NG food removed 10/28/20 - tolerate PO well, passed SLP eval  Afib, likely paroxysmal; questionably provoked Systolic cardiomyopathy Echo 1/3 with EF 55 - 60% - In and out of A. fib over the hospitalization  - Cont hep gtt per pharm for now -hoping to be more rate controlled after acute illness has resolved, unclear if patient will need prolonged anticoagulation given likely provoked nature of A. fib - Continue PO amio  DVT prophylaxis: SCDs only Heparin drip discontinued 10/29/2020 Code Status: DNR Family Communication: Updated son and his wife over the phone at length about poor prognosis, active bleed and ongoing management  Status is: Inpatient  Dispo: The patient is from: Home              Anticipated d/c is to: To be determined              Anticipated d/c date is: > 72 hours              Patient currently not medically stable for discharge  Consultants:   PCCM, nephrology, GI, cardiology, neurology, palliative care  Antimicrobials:  Unasyn 1/2 >> 1/4 Cefepime 1/4 >> 1/7 Flagyl 1/6 >> 1/7 Vancomycin 1/8 > 1/11  Meropenem 1/7 >> ongoing Zyvox 1/12>  ongoing  Subjective: Acute hematochezia and melena noted overnight and rectal tube, hemoglobin 6.0 requiring additional transfusion overnight.  Patient remains somewhat hypotensive despite fluid challenges, clinically patient appears well while  somewhat fatigued and pallorous he states he feels 'quite well just tired; and denies nausea vomiting constipation headache fevers or chills..  Objective: Vitals:   10/29/20 0600 10/29/20 0607 10/29/20 0615 10/29/20 0630  BP: (!) 91/41 (!) 83/45 (!) 81/38 (!) 80/43  Pulse:  71    Resp: 20 14 18 19   Temp:  (!) 97.3 F (36.3 C)    TempSrc:  Axillary    SpO2: 98%  97% 99%  Weight:      Height:        Intake/Output Summary (Last 24 hours) at 10/29/2020 0659 Last data filed at 10/29/2020 0600 Gross per 24 hour  Intake 1265.45 ml  Output 100 ml  Net 1165.45 ml   Filed Weights   10/27/20 1345 10/28/20 0330 10/29/20 0500  Weight: 90.9 kg 90.9 kg 94.8 kg    Examination:  General: Elderly frail gentleman sitting up in bed HEENT: Multiple excoriations and scabs to the face, NG tube secured.  Sclerae nonicteric, noninjected.  Extraocular movements intact bilaterally. Neck: Right IJ dialysis access noted, bandage clean dry intact. Lungs: Diminished bilaterally without overt rhonchi, wheeze, or rales. Heart:  Regular rate and rhythm.  Without murmurs, rubs, or gallops. Abdomen:  Soft, diffusely tender no PMI, minimally distended.  Without guarding or rebound.  Rectal tube draining dark black/brown stool. Extremities: Without cyanosis, clubbing, edema, or obvious deformity. Vascular:  Dorsalis pedis and posterior tibial pulses palpable bilaterally. Skin:  Warm and dry, no erythema, no ulcerations.  Data Reviewed: I have personally reviewed following labs and imaging studies  CBC: Recent  Labs  Lab 10/26/20 0152 10/27/20 0429 10/27/20 1803 10/28/20 0151 10/29/20 0313  WBC 66.5* 54.7* 64.4* 55.5* 57.3*  HGB 7.7* 6.6* 8.8* 7.5* 6.0*  HCT 22.7* 19.9* 26.6* 21.8* 18.1*  MCV 103.7* 105.3* 100.4* 100.5* 102.3*  PLT 158 127* 87* 83* 82*   Basic Metabolic Panel: Recent Labs  Lab 10/25/20 0342 10/25/20 1559 10/26/20 0152 10/26/20 1542 10/27/20 0429 10/27/20 1803 10/28/20 0151  10/28/20 1559 10/29/20 0313  NA 136   < > 136   < > 135 136 134* 134* 133*  K 3.8   < > 3.9   < > 4.2 4.2 3.9 4.6 4.9  CL 101   < > 101   < > 100 99 99 98 97*  CO2 25   < > 24   < > 22 24 23  21* 20*  GLUCOSE 157*   < > 111*   < > 121* 194* 103* 234* 125*  BUN 28*   < > 32*   < > 77* 45* 62* 83* 95*  CREATININE 1.64*   < > 1.89*   < > 4.47* 2.94* 3.89* 4.88* 5.67*  CALCIUM 8.1*   < > 8.1*   < > 8.1* 7.8* 7.6* 7.8* 7.7*  MG 2.5*  --  2.6*  --  2.7*  --  2.2  --  2.5*  PHOS 2.1*   < > 2.1*   < > 5.1* 3.7 4.6 6.4* 8.5*   < > = values in this interval not displayed.   GFR: Estimated Creatinine Clearance: 10.3 mL/min (A) (by C-G formula based on SCr of 5.67 mg/dL (H)). Liver Function Tests: Recent Labs  Lab 10/25/20 0342 10/25/20 1559 10/27/20 0429 10/27/20 1803 10/28/20 0151 10/28/20 1559 10/29/20 0313  AST 47*  --   --   --   --   --   --   ALT 53*  --   --   --   --   --   --   ALKPHOS 225*  --   --   --   --   --   --   BILITOT 0.7  --   --   --   --   --   --   PROT 5.3*  --   --   --   --   --   --   ALBUMIN 2.5*   < > 2.2* 2.3* 2.1* 2.0* 1.9*   < > = values in this interval not displayed.   No results for input(s): LIPASE, AMYLASE in the last 168 hours. No results for input(s): AMMONIA in the last 168 hours. Coagulation Profile: No results for input(s): INR, PROTIME in the last 168 hours. Cardiac Enzymes: No results for input(s): CKTOTAL, CKMB, CKMBINDEX, TROPONINI in the last 168 hours. BNP (last 3 results) No results for input(s): PROBNP in the last 8760 hours. HbA1C: No results for input(s): HGBA1C in the last 72 hours. CBG: Recent Labs  Lab 10/28/20 0427 10/28/20 0724 10/28/20 1212 10/28/20 1728 10/28/20 2002  GLUCAP 70 117* 131* 235* 165*   Lipid Profile: No results for input(s): CHOL, HDL, LDLCALC, TRIG, CHOLHDL, LDLDIRECT in the last 72 hours. Thyroid Function Tests: No results for input(s): TSH, T4TOTAL, FREET4, T3FREE, THYROIDAB in the last 72  hours. Anemia Panel: No results for input(s): VITAMINB12, FOLATE, FERRITIN, TIBC, IRON, RETICCTPCT in the last 72 hours. Sepsis Labs: No results for input(s): PROCALCITON, LATICACIDVEN in the last 168 hours.  Recent Results (from the past 240 hour(s))  Culture, blood (Routine X 2) w Reflex to ID Panel     Status: None   Collection Time: 10/20/20  4:40 PM   Specimen: BLOOD RIGHT HAND  Result Value Ref Range Status   Specimen Description BLOOD RIGHT HAND  Final   Special Requests   Final    BOTTLES DRAWN AEROBIC AND ANAEROBIC Blood Culture adequate volume   Culture   Final    NO GROWTH 5 DAYS Performed at [facility], [address] [place], [place] [address]    Report Status 10/25/2020 FINAL  Final  Culture, blood (Routine X 2) w Reflex to ID Panel     Status: None   Collection Time: 10/20/20  4:43 PM   Specimen: BLOOD  Result Value Ref Range Status   Specimen Description BLOOD LEFT ANTECUBITAL  Final   Special Requests   Final    BOTTLES DRAWN AEROBIC ONLY Blood Culture results may not be optimal due to an inadequate volume of blood received in culture bottles   Culture   Final    NO GROWTH 5 DAYS Performed at [facility], [place] [address], [place], [place] [address]    Report Status 10/25/2020 FINAL  Final  MRSA PCR Screening     Status: None   Collection Time: 10/21/20 10:08 AM   Specimen: Nasal Mucosa; Nasopharyngeal  Result Value Ref Range Status   MRSA by PCR NEGATIVE NEGATIVE Final    Comment:        The GeneXpert MRSA Assay (FDA approved for NASAL specimens only), is one component of a comprehensive MRSA colonization surveillance program. It is not intended to diagnose MRSA infection nor to guide or monitor treatment for MRSA infections. Performed at [facility], [place] [address], [place], [place] [address]     Radiology Studies: No results found.  Scheduled Meds: . acetaminophen (TYLENOL) oral liquid 160 mg/5 mL  650 mg Per Tube Q6H  .  amiodarone  400 mg Per Tube BID  . B-complex with vitamin C  1 tablet Per Tube Daily  . Chlorhexidine Gluconate Cloth  6 each Topical Q1200  . feeding supplement  237 mL Per Tube BID BM  . Gerhardt's butt cream   Topical TID  . insulin aspart  3-9 Units Subcutaneous Q4H  . lidocaine  1 patch Transdermal Q24H  . mouth rinse  15 mL Mouth Rinse BID  . phenol  1 spray Mouth/Throat Once  . predniSONE  40 mg Per Tube Q breakfast  . sodium chloride flush  10-40 mL Intracatheter Q12H  . sodium chloride flush  3 mL Intravenous Q12H  . triamcinolone 0.1 % cream : eucerin   Topical BID   Continuous Infusions: . sodium chloride Stopped (10/27/20 0931)  . sodium chloride    . sodium chloride    . heparin Stopped (10/29/20 0431)  . linezolid (ZYVOX) IV Stopped (10/28/20 2244)  . meropenem (MERREM) IV Stopped (10/28/20 2059)     LOS: 15 days   Time spent: 46min  Raymond Fahy C Teigan Manner, DO [facility]  If 7PM-7AM, please contact night-coverage [email or web address]  10/29/2020, 6:59 AM

## 2020-10-29 NOTE — Progress Notes (Signed)
Night coverage note  Patient on heparin infusion for A. fib with bright red blood per rectum, hemoglobin 6.6.  Hemodynamically stable  Hematochezia Acute blood loss anemia - Stop heparin infusion - Transfuse 1 unit PRBC - SCD for DVT prophylaxis

## 2020-10-29 NOTE — Plan of Care (Signed)
HgB down to 6 this AM, HOTN 1 L NS bolus administered per order 1 unit of PRBC infusing.  Patinet has obvious signs of blood in Flexiseal pt also reports abdomen swollen.  Dr. Ree Kida of all the findings and Heparin gtt was cancelled.    Will continue to monitor closely    Problem: Education: Goal: Knowledge of disease or condition will improve Outcome: Progressing Goal: Knowledge of secondary prevention will improve Outcome: Progressing Goal: Knowledge of patient specific risk factors addressed and post discharge goals established will improve Outcome: Progressing   Problem: Coping: Goal: Will verbalize positive feelings about self Outcome: Progressing Goal: Will identify appropriate support needs Outcome: Progressing   Problem: Health Behavior/Discharge Planning: Goal: Ability to manage health-related needs will improve Outcome: Progressing   Problem: Education: Goal: Knowledge of General Education information will improve Description: Including pain rating scale, medication(s)/side effects and non-pharmacologic comfort measures Outcome: Progressing   Problem: Health Behavior/Discharge Planning: Goal: Ability to manage health-related needs will improve Outcome: Progressing   Problem: Clinical Measurements: Goal: Ability to maintain clinical measurements within normal limits will improve Outcome: Progressing Goal: Will remain free from infection Outcome: Progressing Goal: Diagnostic test results will improve Outcome: Not Progressing Goal: Respiratory complications will improve Outcome: Progressing Goal: Cardiovascular complication will be avoided Outcome: Progressing

## 2020-10-30 ENCOUNTER — Encounter (HOSPITAL_COMMUNITY): Payer: Self-pay | Admitting: Internal Medicine

## 2020-10-30 DIAGNOSIS — R6520 Severe sepsis without septic shock: Secondary | ICD-10-CM | POA: Diagnosis not present

## 2020-10-30 DIAGNOSIS — A419 Sepsis, unspecified organism: Secondary | ICD-10-CM | POA: Diagnosis not present

## 2020-10-30 LAB — BPAM RBC
Blood Product Expiration Date: 202202152359
Blood Product Expiration Date: 202202152359
Blood Product Expiration Date: 202202162359
ISSUE DATE / TIME: 202201140910
ISSUE DATE / TIME: 202201160557
ISSUE DATE / TIME: 202201161604
Unit Type and Rh: 5100
Unit Type and Rh: 5100
Unit Type and Rh: 5100

## 2020-10-30 LAB — GLUCOSE, CAPILLARY
Glucose-Capillary: 110 mg/dL — ABNORMAL HIGH (ref 70–99)
Glucose-Capillary: 113 mg/dL — ABNORMAL HIGH (ref 70–99)
Glucose-Capillary: 116 mg/dL — ABNORMAL HIGH (ref 70–99)
Glucose-Capillary: 146 mg/dL — ABNORMAL HIGH (ref 70–99)
Glucose-Capillary: 161 mg/dL — ABNORMAL HIGH (ref 70–99)
Glucose-Capillary: 203 mg/dL — ABNORMAL HIGH (ref 70–99)
Glucose-Capillary: 69 mg/dL — ABNORMAL LOW (ref 70–99)
Glucose-Capillary: 90 mg/dL (ref 70–99)

## 2020-10-30 LAB — TYPE AND SCREEN
ABO/RH(D): O POS
Antibody Screen: NEGATIVE
Unit division: 0
Unit division: 0
Unit division: 0

## 2020-10-30 LAB — RENAL FUNCTION PANEL
Albumin: 2 g/dL — ABNORMAL LOW (ref 3.5–5.0)
Albumin: 1.8 g/dL — ABNORMAL LOW (ref 3.5–5.0)
Anion gap: 18 — ABNORMAL HIGH (ref 5–15)
Anion gap: 16 — ABNORMAL HIGH (ref 5–15)
BUN: 122 mg/dL — ABNORMAL HIGH (ref 8–23)
BUN: 67 mg/dL — ABNORMAL HIGH (ref 8–23)
CO2: 17 mmol/L — ABNORMAL LOW (ref 22–32)
CO2: 22 mmol/L (ref 22–32)
Calcium: 7.7 mg/dL — ABNORMAL LOW (ref 8.9–10.3)
Calcium: 7.2 mg/dL — ABNORMAL LOW (ref 8.9–10.3)
Chloride: 97 mmol/L — ABNORMAL LOW (ref 98–111)
Chloride: 97 mmol/L — ABNORMAL LOW (ref 98–111)
Creatinine, Ser: 7.1 mg/dL — ABNORMAL HIGH (ref 0.61–1.24)
Creatinine, Ser: 4.5 mg/dL — ABNORMAL HIGH (ref 0.61–1.24)
GFR, Estimated: 7 mL/min — ABNORMAL LOW (ref >60)
GFR, Estimated: 12 mL/min — ABNORMAL LOW (ref >60)
Glucose, Bld: 110 mg/dL — ABNORMAL HIGH (ref 70–99)
Glucose, Bld: 129 mg/dL — ABNORMAL HIGH (ref 70–99)
Phosphorus: 11.6 mg/dL — ABNORMAL HIGH (ref 2.5–4.6)
Phosphorus: 8.2 mg/dL — ABNORMAL HIGH (ref 2.5–4.6)
Potassium: 5.4 mmol/L — ABNORMAL HIGH (ref 3.5–5.1)
Potassium: 4.1 mmol/L (ref 3.5–5.1)
Sodium: 132 mmol/L — ABNORMAL LOW (ref 135–145)
Sodium: 135 mmol/L (ref 135–145)

## 2020-10-30 LAB — CBC
HCT: 22.7 % — ABNORMAL LOW (ref 39.0–52.0)
Hemoglobin: 7.4 g/dL — ABNORMAL LOW (ref 13.0–17.0)
MCH: 31.1 pg (ref 26.0–34.0)
MCHC: 32.6 g/dL (ref 30.0–36.0)
MCV: 95.4 fL (ref 80.0–100.0)
Platelets: 75 10*3/uL — ABNORMAL LOW (ref 150–400)
RBC: 2.38 MIL/uL — ABNORMAL LOW (ref 4.22–5.81)
RDW: 19.9 % — ABNORMAL HIGH (ref 11.5–15.5)
WBC: 47.3 10*3/uL — ABNORMAL HIGH (ref 4.0–10.5)
nRBC: 0.3 % — ABNORMAL HIGH (ref 0.0–0.2)

## 2020-10-30 LAB — MAGNESIUM: Magnesium: 2.4 mg/dL (ref 1.7–2.4)

## 2020-10-30 LAB — HEPARIN LEVEL (UNFRACTIONATED): Heparin Unfractionated: <0.1 IU/mL — ABNORMAL LOW (ref 0.30–0.70)

## 2020-10-30 MED ORDER — HEPARIN SODIUM (PORCINE) 1000 UNIT/ML IJ SOLN
INTRAMUSCULAR | Status: AC
  Administered 2020-10-30: 1000 [IU] via INTRAVENOUS_CENTRAL
  Filled 2020-10-30: qty 4

## 2020-10-30 MED ORDER — NEPRO/CARBSTEADY PO LIQD
237.0000 mL | Freq: Three times a day (TID) | ORAL | Status: DC
  Administered 2020-10-30 – 2020-11-10 (×22): 237 mL via ORAL

## 2020-10-30 NOTE — Progress Notes (Signed)
Results for SOUL, HACKMAN (MRN [id number]) as of 10/30/2020 12:22  Ref. Range 10/29/2020 20:28 10/30/2020 00:28 10/30/2020 03:29 10/30/2020 07:33 10/30/2020 08:04  Glucose-Capillary Latest Ref Range: 70 - 99 mg/dL 172 (H) 203 (H) 110 (H) 69 (L) 90  Noted that CBG was less than 70 mg/dl.  Recommend changing Novolog correction scale to SENSITIVE (0-9 units) every 4 hours, perhaps to TID & HS scale if patient eating sufficiently.    Harvel Ricks RN BSN CDE Diabetes Coordinator Pager: [phone]  8am-5pm

## 2020-10-30 NOTE — Progress Notes (Signed)
Nutrition Follow-up  DOCUMENTATION CODES:   Not applicable  INTERVENTION:  Provide Nepro Shake po TID, each supplement provides 425 kcal and 19 grams protein.  Encourage adequate PO intake.   NUTRITION DIAGNOSIS:   Inadequate oral intake related to acute illness as evidenced by NPO status; diet advanced; progressing  GOAL:   Patient will meet greater than or equal to 90% of their needs; progressing  MONITOR:   PO intake,Supplement acceptance,Skin,Weight trends,Labs,I & O's,Diet advancement  REASON FOR ASSESSMENT:   Rounds (CRRT, plan for Cortrak)    ASSESSMENT:   87 yo male admitted with acute diarrhea on 1/1 with diagnosis of COVID and acute dehydration and subsequently found to have aspiration pneumonia. 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 Meal completion has been 50%. Pt currently has Ensure ordered and has been consuming them. Noted potassium and phosphorous levels elevated. RD to modify nutritional supplements and order Nepro shake instead. Plans for pt to undergo HD today.    Labs and medications reviewed. Potassium elevated at 5.4. Phosphorous elevated at 11.6.  Diet Order:   Diet Order            DIET DYS 3 Room service appropriate? Yes; Fluid consistency: Nectar Thick  Diet effective now                 EDUCATION NEEDS:   Not appropriate for education at this time  Skin:  Skin Assessment: Reviewed RN Assessment  Last BM:  1/16 rectal tube 50 ml  output  Height:   Ht Readings from Last 1 Encounters:  10/28/20 5\' 8"  (1.727 m)    Weight:   Wt Readings from Last 1 Encounters:  10/30/20 96.3 kg    BMI:  Body mass index is 32.28 kg/m.  Estimated Nutritional Needs:   Kcal:  1900-2300 kcals  Protein:  115-140 g  Fluid:  >/= 2L  Corrin Parker, MS, RD, LDN RD pager number/after hours weekend pager number on Amion.

## 2020-10-30 NOTE — Progress Notes (Signed)
[facility] Progress Note     Assessment/ Plan:   1. Acute renal failure - baseline cr 1.2 in June 2021, now with severe AKI in the setting of multiple insults including COVID, diarrhea, and pancreatitis (also was taking entresto).  Severe AKI with Cr up to 8.6, falling UOP 1. CRRT 1/4-1/12. S/p IHD Friday. Next HD tentatively planned for today; hopefully he tolerates. 2. Recommend palliative addressing his overall GOC. My concern is his long-term candidacy for dialysis; not a great candidate at all because of age, comorbidities, poor performance status. Unfortunately there are no signs of renal recovery at the moment. 3. Avoid nephrotoxic medications including NSAIDs and iodinated intravenous contrast exposure unless the latter is absolutely indicated.  Preferred narcotic agents for pain control are hydromorphone, fentanyl, and methadone. Morphine should not be used. Avoid Baclofen and avoid oral sodium phosphate and magnesium citrate based laxatives / bowel preps. Continue strict Input and Output monitoring. Will monitor the patient closely with you and intervene or adjust therapy as indicated by changes in clinical status/labs  2. Acute pancreatitis: Noted on CT scan 10/16/20 and again on 1/7. 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 Mgmt per primary service  3. Acute hypoxemic RF: likely aspiration PNA +/- COVID pneumonitis, stable o2 req. Steroids.   4. Shock: high WBC count, w/u per primary team. Possible ilacus abscess on CT from 1/7. Off levophed 1/11. Mero, zyvox  5. COVID-19 infection: vaccinated and boosted- treatment per primary  6. Acute encephalopathy: multifactorial, likely COVID + acute illness. CTM  8.  Chronic systolic CHF: EF 11/16/27 on TTE 60-65% with no WMA.  9.  Afib: hep and amiodarone po  10. Acute anemia - has received multiple transfusions with hematochezia as well, per  primary service, no heparin on hd  Subjective:   D/w family on speaker while in the room. He denies f/c/n/v and states breathing somewhat better   Objective:   BP (!) 144/46 (BP Location: Right Arm)   Pulse 70   Temp 99 F (37.2 C) (Oral)   Resp 18   Ht 5\' 8"  (1.727 m)   Wt 96.3 kg   SpO2 93%   BMI 32.28 kg/m   Intake/Output Summary (Last 24 hours) at 10/30/2020 1044 Last data filed at 10/30/2020 0206 Gross per 24 hour  Intake 830 ml  Output 800 ml  Net 30 ml   Weight change: 1.5 kg  Physical Exam: GEN: NAD, NCAT HEENT: No conjunctival pallor, EOMI NECK: Supple, no thyromegaly LUNGS: CTA B/L  rhonchi present CV: RRR, No M/R/G ABD: SNDNT +BS, brown tool in rectal tube  EXT: No lower extremity edema ACCESS: RIJ temp    Imaging: No results found.  Labs: BMET Recent Labs  Lab 10/27/20 0429 10/27/20 1803 10/28/20 0151 10/28/20 1559 10/29/20 0313 10/29/20 1433 10/30/20 0308  NA 135 136 134* 134* 133* 132* 132*  K 4.2 4.2 3.9 4.6 4.9 5.2* 5.4*  CL 100 99 99 98 97* 99 97*  CO2 22 24 23  21* 20* 17* 17*  GLUCOSE 121* 194* 103* 234* 125* 205* 110*  BUN 77* 45* 62* 83* 95* 107* 122*  CREATININE 4.47* 2.94* 3.89* 4.88* 5.67* 6.31* 7.10*  CALCIUM 8.1* 7.8* 7.6* 7.8* 7.7* 7.4* 7.7*  PHOS 5.1* 3.7 4.6 6.4* 8.5* 9.8* 11.6*   CBC Recent Labs  Lab 10/27/20 1803 10/28/20 0151 10/29/20 0313 10/29/20 1433 10/29/20 2130 10/30/20 0308  WBC 64.4* 55.5* 57.3*  --   --  47.3*  HGB 8.8* 7.5* 6.0* 6.4* 7.2* 7.4*  HCT 26.6* 21.8* 18.1* 19.8* 22.5* 22.7*  MCV 100.4* 100.5* 102.3*  --   --  95.4  PLT 87* 83* 82*  --   --  75*    Medications:    . acetaminophen (TYLENOL) oral liquid 160 mg/5 mL  650 mg Per Tube Q6H  . amiodarone  400 mg Per Tube BID  . B-complex with vitamin C  1 tablet Per Tube Daily  . Chlorhexidine Gluconate Cloth  6 each Topical Q1200  . feeding supplement  237 mL Per Tube BID BM  . Gerhardt's butt cream   Topical TID  . insulin aspart  3-9  Units Subcutaneous Q4H  . lidocaine  1 patch Transdermal Q24H  . mouth rinse  15 mL Mouth Rinse BID  . phenol  1 spray Mouth/Throat Once  . predniSONE  40 mg Per Tube Q breakfast  . sodium chloride flush  10-40 mL Intracatheter Q12H  . sodium chloride flush  3 mL Intravenous Q12H  . triamcinolone 0.1 % cream : eucerin   Topical BID      Otelia Santee, MD 10/30/2020, 10:44 AM

## 2020-10-30 NOTE — Progress Notes (Signed)
OT Cancellation Note  Patient Details Name: Raymond Hurley. MRN: [id number] DOB: [birth year]   Cancelled Treatment:    Reason Eval/Treat Not Completed: Patient at procedure or test/ unavailable (HD), will follow up as able.  Lou Cal, OT Acute Rehabilitation Services Pager [phone] Office [phone]   [name] 10/30/2020, 2:54 PM

## 2020-10-30 NOTE — Progress Notes (Signed)
Hypoglycemic Event  CBG: 69  Treatment: 8oz juice PO  Symptoms: No symptoms noted  Follow-up CBG: Time: 0804 CBG Result: 90  Possible Reasons for Event: decreased PO intake  Comments/MD notified: MD notified per protocol     Raymond Hurley Luz Lex

## 2020-10-30 NOTE — Progress Notes (Signed)
Patient ID: Raymond Scales., male   DOB: Jun 05, 1931, 87 y.o.   MRN: [id number]  This NP reviewed medical records,  discussed case with team, patient remains in isolation 2/2 Covid 19 infection.    Today is day 19 of his hospital stay.  Patient is HD dependent, frail and with multiple co-morbidites, high risk to decompensate.  I met at patient's bedside, he is alert and oriented  Created space and opportunity for patient  to explore his thoughts and feeling regarding his current medical situation.   Patient is optimistic for continued improvement,  "I just need to get on a schedule and start moving"   Education offered on the importance of mobility.  Demonstrated simple active upper body exercises that he can incorporate in to his day.   Education offered regarding the seriousness of his medical situation and the limitations of medical interventions to prolong quality of life when a body fails to thrive.  Emotional support offered.  Questions and concerns addressed.    Discussed with patient   the importance of continued conversation with his family and the  medical providers regarding overall plan of care and treatment options,  ensuring decisions are within the context of his values and GOCs.   Questions and concerns addressed    I let Raymond Hurley know I was out of the hospital until Monday but will f/u with him then  Total time spent on the unit was 35 minutes  Greater than 50% of the time was spent in counseling and coordination of care  Wadie Lessen NP  Palliative Medicine Team Team Phone # [phone] Pager [phone]

## 2020-10-30 NOTE — Progress Notes (Signed)
PROGRESS NOTE    Raymond Hurley.  GGE:[id number] DOB: 02-20-1931 DOA: 10/14/2020 PCP: Lajean Manes, MD   Brief Narrative:  87 year old man who is hypotensive in the context of acute diarrhea on 1/1 was admitted w/ dx of COVID and acute dehydration from acute diarrheal illness. Of note he was fully vaccinated and had received his booster. Given worsening delirium, multiple organ failure, and profound hypotension with CT abdomen showing pancreatitis.  Given advanced age and multiple organ dysfunction in addition to concern about further clinical decline patient was transitioned to critical care team.  1/4 > hypotension and A.fib RVR.  Transferring to ICU. Started CRRT 1/7 > Worsening leukocytosis.  CT abdomen pelvis with severe pancreatitis, possible iliopsoas abscess.  Deemed too unstable for intervention per surgery and interventional radiology 1/8 > started pressors.  Antibiotics broadened to meropenem and vancomycin.  Made DNR 1/12 > stopped CRRT 1/13 > iHD 1/14-15 > passed speech evaluation, tolerating p.o. well, NG tube removed 1/16 > acutely overnight noted to have hematochezia, hemoglobin 6.0, -Heparin drip stopped, hypotensive, concern for worsening clinical deterioration remains high risk for decompensation - 2u PRBC given 1/17 > Hgb improved - plan for HD if BP holds; Palliative to follow along given tenuous status.  Assessment & Plan:   Principal Problem:   Severe sepsis ([place]) Active Problems:   PAF (paroxysmal atrial fibrillation) (HCC)   Anticoagulated   Pneumonia due to COVID-19 virus   Chronic systolic CHF (congestive heart failure) (HCC)   Elevated troponin   Facial weakness   Hypocalcemia   Macrocytic anemia   Acute metabolic encephalopathy   Acute respiratory failure (HCC)   AKI (acute kidney injury) (HCC)   Dehydration   Persistent atrial fibrillation (HCC)  Goals of care discussion  -Lengthy discussion with patient's son over the phone, we discussed  patient remains very high risk for decompensation -Patient's blood pressure improving with IV fluids and blood transfusions -We discussed that at this point given patient's anemia hypotension, COVID-19 pneumonia, abdominal infection and pancreatitis at his advanced age he remains extremely high risk for decompensation and would not benefit from any further aggressive measures including pressors, intubation or CPR.j -Unfortunately given his age and comorbid diseases long-term hemodialysis will likely be limited in and of itself. -Palliative care following, pending the next 24 to 48 hours will likely need to discuss with family goals of care including but not limited to palliative care and hospice should patient continue to worsen  Acute blood loss anemia requiring transfusion; Concurrent anemia of kidney disease, profound dehydration and acute illness - Transfusion 1/12, 1/13, 1/16x2 (4 total since admission) - Patient's hematochezia/melena no longer noticeable in rectal tube - Heparin drip discontinued - Goal Hgb > 7  Acute hypoxic respiratory failure 2/2 aspiration PNA, improved  Cannot rule out concurrent/resolving COVID-19 infection  -Continue to wean O2 as tolerated -Continue early ambulation, incentive spirometry, flutter -Continue steroids - wean as tolelrated  Septic shock, resolved Possible iliac abscess -Previous team discussed with IR/CCS on 1/7, no surgery or drain indicated -stress dose steroids off  -Meropenem/linezolid ongoing - stop after 10 days and follow clinically; attempting to avoid further broad spectrum abx given high risk for resistant organisms  Acute metabolic encephalopathy, multifactorial: In the setting of uremia, septic shock, pancreatitis, anemia and COVID-19 infection Resolved - Continue supportive care, delirium precautions - Mental status appears to be improving drastically ANOx4 this morning; very appropriate questions  Acute renal failure requiring  RRT  Hypophosphatemia, mild -  Suspect secondary to ATN after recent diarrheal illness and volume depletion in the setting of profound disease, septic shock, COVID infection - Nephro following - CRRT clotted off 10/25/20 - renal aware suspect transition to iHD as of 10/27/20  Acute pancreatitis, resolving - Continue pain control, supportive care, leukocytosis still markedly elevated - NG tube/feeds removed 10/28/20 - tolerate PO well, passed SLP eval - Rectal tube likely to be removed in the next 24-48h given more formed/soft stool on exam today in tube - no longer watery  Afib, likely paroxysmal; questionably provoked Systolic cardiomyopathy Echo 1/3 with EF 55 - 60% - In and out of A. fib over the hospitalization  - Cont hep gtt per pharm for now -hoping to be more rate controlled after acute illness has resolved, unclear if patient will need prolonged anticoagulation given likely provoked nature of A. fib - Continue PO amio  DVT prophylaxis: SCDs only Heparin drip discontinued 10/29/2020 Code Status: DNR Family Communication: Updated son over the phone at length about poor prognosis, active bleed and ongoing management  Status is: Inpatient  Dispo: The patient is from: Home              Anticipated d/c is to: To be determined              Anticipated d/c date is: > 72 hours              Patient currently not medically stable for discharge  Consultants:   PCCM, nephrology, GI, cardiology, neurology, palliative care  Antimicrobials:  Unasyn 1/2 >> 1/4 Cefepime 1/4 >> 1/7 Flagyl 1/6 >> 1/7 Vancomycin 1/8 > 1/11  Meropenem 1/7 >> 1/17 Zyvox 1/12>  ongoing through 1/21  Subjective: Acute hematochezia and melena appears to have resoolved; hgb 7.4; hypotension resolved after blood transfusion/fluid challenge. Clinically patient appears well while somewhat fatigued and pallorous he states he feels 'quite well just tired; and denies nausea vomiting constipation headache fevers or  chills..  Objective: Vitals:   10/30/20 0000 10/30/20 0200 10/30/20 0328 10/30/20 0500  BP: (!) 106/48 (!) 109/47 (!) 110/49   Pulse: 71  70   Resp: 18 20 16    Temp:  98.6 F (37 C) 98.5 F (36.9 C)   TempSrc:  Oral Oral   SpO2:  96% 100%   Weight:    96.3 kg  Height:        Intake/Output Summary (Last 24 hours) at 10/30/2020 8841 Last data filed at 10/30/2020 0206 Gross per 24 hour  Intake 1145 ml  Output 800 ml  Net 345 ml   Filed Weights   10/28/20 0330 10/29/20 0500 10/30/20 0500  Weight: 90.9 kg 94.8 kg 96.3 kg    Examination:  General: Elderly frail gentleman sitting up in bed HEENT: Multiple excoriations and scabs to the face, sclerae nonicteric, noninjected.  Extraocular movements intact bilaterally. Neck: Right IJ dialysis access noted, bandage clean dry intact. Lungs: Diminished bilaterally without overt rhonchi, wheeze, or rales. Heart:  Regular rate and rhythm.  Without murmurs, rubs, or gallops. Abdomen:  Soft, diffusely tender no PMI, minimally distended.  Without guarding or rebound.  Rectal tube draining green/brown stool. Extremities: Without cyanosis, clubbing, edema, or obvious deformity. Vascular:  Dorsalis pedis and posterior tibial pulses palpable bilaterally. Skin:  Warm and dry, no erythema, no ulcerations.  Data Reviewed: I have personally reviewed following labs and imaging studies  CBC: Recent Labs  Lab 10/27/20 0429 10/27/20 1803 10/28/20 0151 10/29/20 0313 10/29/20 1433 10/29/20 2130 10/30/20  0308  WBC 54.7* 64.4* 55.5* 57.3*  --   --  47.3*  HGB 6.6* 8.8* 7.5* 6.0* 6.4* 7.2* 7.4*  HCT 19.9* 26.6* 21.8* 18.1* 19.8* 22.5* 22.7*  MCV 105.3* 100.4* 100.5* 102.3*  --   --  95.4  PLT 127* 87* 83* 82*  --   --  75*   Basic Metabolic Panel: Recent Labs  Lab 10/26/20 0152 10/26/20 1542 10/27/20 0429 10/27/20 1803 10/28/20 0151 10/28/20 1559 10/29/20 0313 10/29/20 1433 10/30/20 0308  NA 136   < > 135   < > 134* 134* 133* 132*  132*  K 3.9   < > 4.2   < > 3.9 4.6 4.9 5.2* 5.4*  CL 101   < > 100   < > 99 98 97* 99 97*  CO2 24   < > 22   < > 23 21* 20* 17* 17*  GLUCOSE 111*   < > 121*   < > 103* 234* 125* 205* 110*  BUN 32*   < > 77*   < > 62* 83* 95* 107* 122*  CREATININE 1.89*   < > 4.47*   < > 3.89* 4.88* 5.67* 6.31* 7.10*  CALCIUM 8.1*   < > 8.1*   < > 7.6* 7.8* 7.7* 7.4* 7.7*  MG 2.6*  --  2.7*  --  2.2  --  2.5*  --  2.4  PHOS 2.1*   < > 5.1*   < > 4.6 6.4* 8.5* 9.8* 11.6*   < > = values in this interval not displayed.   GFR: Estimated Creatinine Clearance: 8.3 mL/min (A) (by C-G formula based on SCr of 7.1 mg/dL (H)). Liver Function Tests: Recent Labs  Lab 10/25/20 0342 10/25/20 1559 10/28/20 0151 10/28/20 1559 10/29/20 0313 10/29/20 1433 10/30/20 0308  AST 47*  --   --   --   --   --   --   ALT 53*  --   --   --   --   --   --   ALKPHOS 225*  --   --   --   --   --   --   BILITOT 0.7  --   --   --   --   --   --   PROT 5.3*  --   --   --   --   --   --   ALBUMIN 2.5*   < > 2.1* 2.0* 1.9* 1.9* 2.0*   < > = values in this interval not displayed.   No results for input(s): LIPASE, AMYLASE in the last 168 hours. No results for input(s): AMMONIA in the last 168 hours. Coagulation Profile: No results for input(s): INR, PROTIME in the last 168 hours. Cardiac Enzymes: No results for input(s): CKTOTAL, CKMB, CKMBINDEX, TROPONINI in the last 168 hours. BNP (last 3 results) No results for input(s): PROBNP in the last 8760 hours. HbA1C: No results for input(s): HGBA1C in the last 72 hours. CBG: Recent Labs  Lab 10/29/20 1219 10/29/20 1717 10/29/20 2028 10/30/20 0028 10/30/20 0329  GLUCAP 134* 160* 172* 203* 110*   Lipid Profile: No results for input(s): CHOL, HDL, LDLCALC, TRIG, CHOLHDL, LDLDIRECT in the last 72 hours. Thyroid Function Tests: No results for input(s): TSH, T4TOTAL, FREET4, T3FREE, THYROIDAB in the last 72 hours. Anemia Panel: No results for input(s): VITAMINB12, FOLATE,  FERRITIN, TIBC, IRON, RETICCTPCT in the last 72 hours. Sepsis Labs: No results for input(s): PROCALCITON, LATICACIDVEN in the last  168 hours.  Recent Results (from the past 240 hour(s))  Culture, blood (Routine X 2) w Reflex to ID Panel     Status: None   Collection Time: 10/20/20  4:40 PM   Specimen: BLOOD RIGHT HAND  Result Value Ref Range Status   Specimen Description BLOOD RIGHT HAND  Final   Special Requests   Final    BOTTLES DRAWN AEROBIC AND ANAEROBIC Blood Culture adequate volume   Culture   Final    NO GROWTH 5 DAYS Performed at [facility], [address], [place], [place] [address]    Report Status 10/25/2020 FINAL  Final  Culture, blood (Routine X 2) w Reflex to ID Panel     Status: None   Collection Time: 10/20/20  4:43 PM   Specimen: BLOOD  Result Value Ref Range Status   Specimen Description BLOOD LEFT ANTECUBITAL  Final   Special Requests   Final    BOTTLES DRAWN AEROBIC ONLY Blood Culture results may not be optimal due to an inadequate volume of blood received in culture bottles   Culture   Final    NO GROWTH 5 DAYS Performed at [facility], [place] [address] [place], [place] [address]    Report Status 10/25/2020 FINAL  Final  MRSA PCR Screening     Status: None   Collection Time: 10/21/20 10:08 AM   Specimen: Nasal Mucosa; Nasopharyngeal  Result Value Ref Range Status   MRSA by PCR NEGATIVE NEGATIVE Final    Comment:        The GeneXpert MRSA Assay (FDA approved for NASAL specimens only), is one component of a comprehensive MRSA colonization surveillance program. It is not intended to diagnose MRSA infection nor to guide or monitor treatment for MRSA infections. Performed at [facility], [place] [address], [place], [place] [address]     Radiology Studies: No results found.  Scheduled Meds: . acetaminophen (TYLENOL) oral liquid 160 mg/5 mL  650 mg Per Tube Q6H  . amiodarone  400 mg Per Tube BID  . B-complex with vitamin C  1  tablet Per Tube Daily  . Chlorhexidine Gluconate Cloth  6 each Topical Q1200  . feeding supplement  237 mL Per Tube BID BM  . Gerhardt's butt cream   Topical TID  . insulin aspart  3-9 Units Subcutaneous Q4H  . lidocaine  1 patch Transdermal Q24H  . mouth rinse  15 mL Mouth Rinse BID  . phenol  1 spray Mouth/Throat Once  . predniSONE  40 mg Per Tube Q breakfast  . sodium chloride flush  10-40 mL Intracatheter Q12H  . sodium chloride flush  3 mL Intravenous Q12H  . triamcinolone 0.1 % cream : eucerin   Topical BID   Continuous Infusions: . sodium chloride Stopped (10/27/20 0931)  . sodium chloride    . sodium chloride    . linezolid (ZYVOX) IV Stopped (10/29/20 2323)  . meropenem (MERREM) IV 500 mg (10/29/20 2128)     LOS: 16 days   Time spent: 60min  Fallynn Gravett C Donna Silverman, DO [facility]  If 7PM-7AM, please contact night-coverage [email or web address]  10/30/2020, 6:28 AM

## 2020-10-31 DIAGNOSIS — R6520 Severe sepsis without septic shock: Secondary | ICD-10-CM | POA: Diagnosis not present

## 2020-10-31 DIAGNOSIS — A419 Sepsis, unspecified organism: Secondary | ICD-10-CM | POA: Diagnosis not present

## 2020-10-31 LAB — GLUCOSE, CAPILLARY
Glucose-Capillary: 112 mg/dL — ABNORMAL HIGH (ref 70–99)
Glucose-Capillary: 121 mg/dL — ABNORMAL HIGH (ref 70–99)
Glucose-Capillary: 134 mg/dL — ABNORMAL HIGH (ref 70–99)
Glucose-Capillary: 136 mg/dL — ABNORMAL HIGH (ref 70–99)
Glucose-Capillary: 139 mg/dL — ABNORMAL HIGH (ref 70–99)
Glucose-Capillary: 62 mg/dL — ABNORMAL LOW (ref 70–99)
Glucose-Capillary: 81 mg/dL (ref 70–99)
Glucose-Capillary: 96 mg/dL (ref 70–99)

## 2020-10-31 LAB — CBC
HCT: 18.3 % — ABNORMAL LOW (ref 39.0–52.0)
Hemoglobin: 6.1 g/dL — CL (ref 13.0–17.0)
MCH: 31.8 pg (ref 26.0–34.0)
MCHC: 33.3 g/dL (ref 30.0–36.0)
MCV: 95.3 fL (ref 80.0–100.0)
Platelets: 63 10*3/uL — ABNORMAL LOW (ref 150–400)
RBC: 1.92 MIL/uL — ABNORMAL LOW (ref 4.22–5.81)
RDW: 19.4 % — ABNORMAL HIGH (ref 11.5–15.5)
WBC: 49.3 10*3/uL — ABNORMAL HIGH (ref 4.0–10.5)
nRBC: 0.2 % (ref 0.0–0.2)

## 2020-10-31 LAB — PREPARE RBC (CROSSMATCH)

## 2020-10-31 LAB — HEMOGLOBIN AND HEMATOCRIT, BLOOD
HCT: 21.6 % — ABNORMAL LOW (ref 39.0–52.0)
Hemoglobin: 7.3 g/dL — ABNORMAL LOW (ref 13.0–17.0)

## 2020-10-31 LAB — RENAL FUNCTION PANEL
Albumin: 1.8 g/dL — ABNORMAL LOW (ref 3.5–5.0)
Albumin: 1.9 g/dL — ABNORMAL LOW (ref 3.5–5.0)
Anion gap: 17 — ABNORMAL HIGH (ref 5–15)
Anion gap: 18 — ABNORMAL HIGH (ref 5–15)
BUN: 72 mg/dL — ABNORMAL HIGH (ref 8–23)
BUN: 89 mg/dL — ABNORMAL HIGH (ref 8–23)
CO2: 21 mmol/L — ABNORMAL LOW (ref 22–32)
CO2: 20 mmol/L — ABNORMAL LOW (ref 22–32)
Calcium: 7.2 mg/dL — ABNORMAL LOW (ref 8.9–10.3)
Calcium: 7.5 mg/dL — ABNORMAL LOW (ref 8.9–10.3)
Chloride: 97 mmol/L — ABNORMAL LOW (ref 98–111)
Chloride: 95 mmol/L — ABNORMAL LOW (ref 98–111)
Creatinine, Ser: 4.82 mg/dL — ABNORMAL HIGH (ref 0.61–1.24)
Creatinine, Ser: 5.71 mg/dL — ABNORMAL HIGH (ref 0.61–1.24)
GFR, Estimated: 11 mL/min — ABNORMAL LOW (ref >60)
GFR, Estimated: 9 mL/min — ABNORMAL LOW (ref >60)
Glucose, Bld: 95 mg/dL (ref 70–99)
Glucose, Bld: 130 mg/dL — ABNORMAL HIGH (ref 70–99)
Phosphorus: 8.9 mg/dL — ABNORMAL HIGH (ref 2.5–4.6)
Phosphorus: 10.1 mg/dL — ABNORMAL HIGH (ref 2.5–4.6)
Potassium: 4.3 mmol/L (ref 3.5–5.1)
Potassium: 4.8 mmol/L (ref 3.5–5.1)
Sodium: 135 mmol/L (ref 135–145)
Sodium: 133 mmol/L — ABNORMAL LOW (ref 135–145)

## 2020-10-31 LAB — MAGNESIUM: Magnesium: 2.1 mg/dL (ref 1.7–2.4)

## 2020-10-31 MED ORDER — SODIUM CHLORIDE 0.9% IV SOLUTION: Freq: Once | INTRAVENOUS | Status: AC

## 2020-10-31 NOTE — Progress Notes (Signed)
Inpatient Diabetes Program Recommendations  AACE/ADA: New Consensus Statement on Inpatient Glycemic Control (2015)  Target Ranges:  Prepandial:   less than 140 mg/dL      Peak postprandial:   less than 180 mg/dL (1-2 hours)      Critically ill patients:  140 - 180 mg/dL   Lab Results  Component Value Date   GLUCAP 96 10/31/2020   HGBA1C 5.7 (H) 10/14/2020    Review of Glycemic Control Results for Raymond Hurley, Raymond Hurley (MRN [id number]) as of 10/31/2020 10:49  Ref. Range 10/30/2020 20:14 10/30/2020 23:15 10/31/2020 04:13 10/31/2020 04:49 10/31/2020 07:36  Glucose-Capillary Latest Ref Range: 70 - 99 mg/dL 146 (H) 161 (H) 62 (L) 81 96     Inpatient Diabetes Program Recommendations:    If eating please consider,  Novolog 0-9 units TID  Will continue to follow while inpatient.  Thank you, Reche Dixon, RN, BSN Diabetes Coordinator Inpatient Diabetes Program [phone] (team pager from 8a-5p)

## 2020-10-31 NOTE — Progress Notes (Signed)
Physical Therapy Treatment Patient Details Name: Raymond Hurley. MRN: [id number] DOB: 08/26/1931 Today's Date: 10/31/2020    History of Present Illness Pt is an 87 y.o. male admitted 10/14/20 with hypotension, diarrhea, COVID-19. Course complicated by AMS, aspiration PNA, multiple organ failure, pancreatitis. New onset afib with RVR 1/4 and transfer to ICU. CRRT 1/4-1/12; intermittent HD initiated 1/15. PMH includes metastatic melanoma, afib, CHF.   PT Comments    Pt with significantly increased weakness, fatigue and decreased activity tolerance compared to previous PT session. Pt required modA for bed mobility and maxA+2 to stand with Stedy frame, unable to achieve fully upright posture. Pt with multiple bouts of bowel incontinence, dependent for pericare. Despite this, pt remains pleasant and motivated to participate. VSS on RA (SpO2 98%). Continue to recommend SNF-level therapies to maximize functional mobility and independence.    Follow Up Recommendations  SNF;Supervision/Assistance - 24 hour     Equipment Recommendations   (TBD - if home today, needs w/c, BSC, hospital bed, hoyer lift)    Recommendations for Other Services       Precautions / Restrictions Precautions Precautions: Fall;Other (comment) Precaution Comments: Bowel incontinence (apparently had flexiseal removed yesterday) Restrictions Weight Bearing Restrictions: No    Mobility  Bed Mobility Overal bed mobility: Needs Assistance Bed Mobility: Sit to Supine;Rolling Rolling: Mod assist;Min assist     Sit to supine: Mod assist   General bed mobility comments: ModA for LE management and trunk control with return to supine; multiple rolls R/L for pericare/linen change due to multiple bouts of bowel incontinence, pt able to roll with min-modA and use of bed rail  Transfers Overall transfer level: Needs assistance Equipment used: Ambulation equipment used Transfers: Sit to/from Stand Sit to Stand: Max  assist;+2 physical assistance;+2 safety/equipment         General transfer comment: MaxA+2 to assist standing from low recliner height with stedy frame; pt with difficulty achieving hip/trunk extension in order to achieve fully upright posture; modA+2 to stand from raised stedy frame seat  Ambulation/Gait             General Gait Details:  (unable)   Stairs             Wheelchair Mobility    Modified Rankin (Stroke Patients Only)       Balance Overall balance assessment: Needs assistance Sitting-balance support: Feet supported;Bilateral upper extremity supported Sitting balance-Leahy Scale: Fair       Standing balance-Leahy Scale: Zero                              Cognition Arousal/Alertness: Awake/alert Behavior During Therapy: WFL for tasks assessed/performed Overall Cognitive Status: No family/caregiver present to determine baseline cognitive functioning Area of Impairment: Problem solving;Attention;Awareness                   Current Attention Level: Selective       Awareness: Emergent Problem Solving: Slow processing;Requires verbal cues        Exercises Other Exercises Other Exercises: Requiring significant assist to mobilize BLEs this session, including maxA to assist R hip flex and knee ext while seated and R hip abd while supine    General Comments General comments (skin integrity, edema, etc.): SpO2 98% on RA. Pt left in L-sidelying for pressure relief to sacrum and comfort; positioned to reduce pressure to bony prominences. Multiple bouts of bowel incontinence, dependent for pericare with NT present to assist;  pt able to express when having incontinence      Pertinent Vitals/Pain Pain Assessment: Faces Faces Pain Scale: Hurts little more Pain Location: Bottom with pericare Pain Descriptors / Indicators: Discomfort;Sore Pain Intervention(s): Monitored during session;Repositioned    Home Living                       Prior Function            PT Goals (current goals can now be found in the care plan section) Acute Rehab PT Goals Patient Stated Goal: I want to get stronger PT Goal Formulation: With patient Time For Goal Achievement: 11/14/20 Potential to Achieve Goals: Fair Progress towards PT goals: Not progressing toward goals - comment (increased fatigue/weakness today)    Frequency    Min 2X/week      PT Plan Frequency needs to be updated    Co-evaluation              AM-PAC PT "6 Clicks" Mobility   Outcome Measure  Help needed turning from your back to your side while in a flat bed without using bedrails?: A Lot Help needed moving from lying on your back to sitting on the side of a flat bed without using bedrails?: A Lot Help needed moving to and from a bed to a chair (including a wheelchair)?: A Lot Help needed standing up from a chair using your arms (e.g., wheelchair or bedside chair)?: Total Help needed to walk in hospital room?: Total Help needed climbing 3-5 steps with a railing? : Total 6 Click Score: 9    End of Session Equipment Utilized During Treatment: Gait belt Activity Tolerance: Patient tolerated treatment well;Patient limited by fatigue Patient left: in bed;with call bell/phone within reach;with bed alarm set Nurse Communication: Mobility status PT Visit Diagnosis: Other abnormalities of gait and mobility (R26.89);Difficulty in walking, not elsewhere classified (R26.2);Muscle weakness (generalized) (M62.81)     Time: 5809-9833 PT Time Calculation (min) (ACUTE ONLY): 39 min  Charges:  $Therapeutic Activity: 38-52 mins                    Mabeline Caras, PT, DPT Acute Rehabilitation Services  Pager [phone] Office [place] 10/31/2020, 5:08 PM

## 2020-10-31 NOTE — Progress Notes (Addendum)
PROGRESS NOTE    Raymond Hurley.  HER:[id number] DOB: 1931-08-14 DOA: 10/14/2020 PCP: Lajean Manes, MD   Brief Narrative:  87 year old man who is hypotensive in the context of acute diarrhea on 1/1 was admitted w/ dx of COVID and acute dehydration from acute diarrheal illness. Of note he was fully vaccinated and had received his booster. Given worsening delirium, multiple organ failure, and profound hypotension with CT abdomen showing pancreatitis.  Given advanced age and multiple organ dysfunction in addition to concern about further clinical decline patient was transitioned to critical care team.  1/4 > hypotension and A.fib RVR.  Transferring to ICU. Started CRRT 1/7 > Worsening leukocytosis.  CT abdomen pelvis with severe pancreatitis, possible iliopsoas abscess.  Deemed too unstable for intervention per surgery and interventional radiology 1/8 > started pressors.  Antibiotics broadened to meropenem and vancomycin.  Made DNR 1/12 > stopped CRRT 1/13 > iHD 1/14-15 > passed speech evaluation, tolerating p.o. well, NG tube removed 1/16 > acutely overnight noted to have hematochezia, hemoglobin 6.0, -Heparin drip stopped, hypotensive, concern for worsening clinical deterioration remains high risk for decompensation - 2u PRBC given 1/17 > Hgb improved - plan for HD if BP holds; Palliative to follow along given tenuous status. 1/18 > repeat hemoglobin drop - GI bleed resolved - this may be due to advancing kidney disease; poor prognosticator - family updated over group video chat at bedside  Assessment & Plan:   Principal Problem:   Severe sepsis ([place]) Active Problems:   PAF (paroxysmal atrial fibrillation) (HCC)   Anticoagulated   Pneumonia due to COVID-19 virus   Chronic systolic CHF (congestive heart failure) (HCC)   Elevated troponin   Facial weakness   Hypocalcemia   Macrocytic anemia   Acute metabolic encephalopathy   Acute respiratory failure (HCC)   AKI (acute kidney  injury) (HCC)   Dehydration   Persistent atrial fibrillation (HCC)  Goals of care discussion  - Lengthy discussion with patient's son over the phone, we discussed patient remains very high risk for decompensation - Patient's blood pressure improving with IV fluids and blood transfusions - We discussed that at this point given patient's anemia hypotension, COVID-19 pneumonia, abdominal infection and pancreatitis at his advanced age he remains extremely high risk for decompensation and would not benefit from any further aggressive measures including pressors, intubation or CPR. - Unfortunately given his age and comorbid diseases long-term hemodialysis will likely be limited in and of itself. - Palliative care following, pending the next 24 to 48 hours will likely need to discuss with family goals of care including but not limited to palliative care and hospice should patient continue to worsen  Acute blood loss anemia requiring transfusion; Concurrent anemia of kidney disease, profound dehydration and acute illness - Transfusion 1/12, 1/13, 1/16x2, 1/18 (5 total since admission) - Patient's hematochezia/melena no longer noticeable in rectal tube -Repeat hemoglobin this morning again low at 6.0, 1 unit transfused, repeat hemoglobin 7.3  Acute hypoxic respiratory failure 2/2 aspiration PNA, improved  Cannot rule out concurrent/resolving COVID-19 infection  - On room air at rest - Continue early ambulation, incentive spirometry, flutter - Continue steroids - wean as tolelrated  Septic shock, resolved Possible iliac abscess - Previous team discussed with IR/CCS on 1/7, no surgery or drain indicated - Meropenem/linezolid completed - follow for fever curve/symptoms. Will likely need to involve ID if concern for worsening/re-infection given profound antibiotic use.  Acute metabolic encephalopathy, multifactorial: In the setting of uremia, septic shock,  pancreatitis, anemia and COVID-19  infection Resolved - Continue supportive care, delirium precautions - Mental status appears to be improving drastically ANOx4 this morning; very appropriate questions  Acute renal failure requiring RRT Hypophosphatemia, mild - Suspect secondary to ATN after recent diarrheal illness and volume depletion in the setting of profound disease, septic shock, COVID infection - Nephrology following - CRRT clotted off 10/25/20 - HD MWF ongoing as tolerated  Acute pancreatitis, resolving - Continue pain control, supportive care, leukocytosis still markedly elevated - NG tube/feeds removed 10/28/20 - tolerate PO well, passed SLP eval - Rectal tube to be removed 10/31/20 - more formed stool with questionable abdominal fullness this morning on exam  Afib, likely paroxysmal; questionably provoked Systolic cardiomyopathy Echo 1/3 with EF 55 - 60% - In and out of A. fib over the hospitalization  - Heparin drip stopped due to hematochezia 1/16 - Continue PO amio  DVT prophylaxis: SCDs only Heparin drip discontinued 10/29/2020 Code Status: DNR Family Communication: Updated over video conference at length about poor prognosis, active bleed and ongoing management  Status is: Inpatient  Dispo: The patient is from: Home              Anticipated d/c is to: To be determined              Anticipated d/c date is: > 72 hours              Patient currently not medically stable for discharge  Consultants:   PCCM, nephrology, GI, cardiology, neurology, palliative care  Antimicrobials:  Unasyn 1/2 >> 1/4 Cefepime 1/4 >> 1/7 Flagyl 1/6 >> 1/7 Vancomycin 1/8 > 1/11  Meropenem 1/7 >> 1/18 Zyvox 1/12>  1/18  Subjective: Acutely worsening hemoglobin overnight, no further signs of hematochezia melena or other bleeding.  Patient clinically feels well this morning while being transfused with blood, otherwise denies shortness of breath nausea vomiting.  He also comments on abdominal "fullness" which is likely  more formed stool unable to pass through rectal tube, we discussed and patient agreed for rectal tube removal later today.  Objective: Vitals:   10/31/20 0541 10/31/20 0612 10/31/20 0630 10/31/20 0733  BP: (!) 103/51 (!) 103/51 (!) 106/48 (!) 93/52  Pulse: 69 69 76 67  Resp: 20 20 19 18   Temp: 97.7 F (36.5 C) 97.9 F (36.6 C) 97.6 F (36.4 C) 97.8 F (36.6 C)  TempSrc: Oral Oral Oral Axillary  SpO2: 96% 97% 96% 99%  Weight:      Height:        Intake/Output Summary (Last 24 hours) at 10/31/2020 0740 Last data filed at 10/31/2020 0017 Gross per 24 hour  Intake 100.47 ml  Output 2 ml  Net 98.47 ml   Filed Weights   10/30/20 0500 10/30/20 1435 10/30/20 1740  Weight: 96.3 kg 95.5 kg 94.6 kg    Examination:  General: Elderly frail gentleman sitting up in bed tolerating p.o. without any difficulty HEENT: Multiple excoriations and scabs to the face, sclerae nonicteric, noninjected.  Extraocular movements intact bilaterally. Neck: Right IJ dialysis access noted, bandage clean dry intact. Lungs: Diminished bilaterally without overt rhonchi, wheeze, or rales. Heart:  Regular rate and rhythm.  Without murmurs, rubs, or gallops. Abdomen:  Soft, nontender minimally distended, fullness with palpation to lower abdomen bilaterally.  Without guarding or rebound.  Rectal tube draining green/brown stool. Extremities: Without cyanosis, clubbing, edema, or obvious deformity. Vascular:  Dorsalis pedis and posterior tibial pulses palpable bilaterally. Skin:  Warm and dry, no  erythema, no ulcerations.  Data Reviewed: I have personally reviewed following labs and imaging studies  CBC: Recent Labs  Lab 10/27/20 1803 10/28/20 0151 10/29/20 0313 10/29/20 1433 10/29/20 2130 10/30/20 0308 10/31/20 0232  WBC 64.4* 55.5* 57.3*  --   --  47.3* 49.3*  HGB 8.8* 7.5* 6.0* 6.4* 7.2* 7.4* 6.1*  HCT 26.6* 21.8* 18.1* 19.8* 22.5* 22.7* 18.3*  MCV 100.4* 100.5* 102.3*  --   --  95.4 95.3  PLT 87*  83* 82*  --   --  75* 63*   Basic Metabolic Panel: Recent Labs  Lab 10/27/20 0429 10/27/20 1803 10/28/20 0151 10/28/20 1559 10/29/20 0313 10/29/20 1433 10/30/20 0308 10/30/20 2158 10/31/20 0232  NA 135   < > 134*   < > 133* 132* 132* 135 135  K 4.2   < > 3.9   < > 4.9 5.2* 5.4* 4.1 4.3  CL 100   < > 99   < > 97* 99 97* 97* 97*  CO2 22   < > 23   < > 20* 17* 17* 22 21*  GLUCOSE 121*   < > 103*   < > 125* 205* 110* 129* 95  BUN 77*   < > 62*   < > 95* 107* 122* 67* 72*  CREATININE 4.47*   < > 3.89*   < > 5.67* 6.31* 7.10* 4.50* 4.82*  CALCIUM 8.1*   < > 7.6*   < > 7.7* 7.4* 7.7* 7.2* 7.2*  MG 2.7*  --  2.2  --  2.5*  --  2.4  --  2.1  PHOS 5.1*   < > 4.6   < > 8.5* 9.8* 11.6* 8.2* 8.9*   < > = values in this interval not displayed.   GFR: Estimated Creatinine Clearance: 12 mL/min (A) (by C-G formula based on SCr of 4.82 mg/dL (H)). Liver Function Tests: Recent Labs  Lab 10/25/20 0342 10/25/20 1559 10/29/20 0313 10/29/20 1433 10/30/20 0308 10/30/20 2158 10/31/20 0232  AST 47*  --   --   --   --   --   --   ALT 53*  --   --   --   --   --   --   ALKPHOS 225*  --   --   --   --   --   --   BILITOT 0.7  --   --   --   --   --   --   PROT 5.3*  --   --   --   --   --   --   ALBUMIN 2.5*   < > 1.9* 1.9* 2.0* 1.8* 1.8*   < > = values in this interval not displayed.   No results for input(s): LIPASE, AMYLASE in the last 168 hours. No results for input(s): AMMONIA in the last 168 hours. Coagulation Profile: No results for input(s): INR, PROTIME in the last 168 hours. Cardiac Enzymes: No results for input(s): CKTOTAL, CKMB, CKMBINDEX, TROPONINI in the last 168 hours. BNP (last 3 results) No results for input(s): PROBNP in the last 8760 hours. HbA1C: No results for input(s): HGBA1C in the last 72 hours. CBG: Recent Labs  Lab 10/30/20 2014 10/30/20 2315 10/31/20 0413 10/31/20 0449 10/31/20 0736  GLUCAP 146* 161* 62* 81 96   Lipid Profile: No results for input(s):  CHOL, HDL, LDLCALC, TRIG, CHOLHDL, LDLDIRECT in the last 72 hours. Thyroid Function Tests: No results for input(s): TSH, T4TOTAL, FREET4, T3FREE,  THYROIDAB in the last 72 hours. Anemia Panel: No results for input(s): VITAMINB12, FOLATE, FERRITIN, TIBC, IRON, RETICCTPCT in the last 72 hours. Sepsis Labs: No results for input(s): PROCALCITON, LATICACIDVEN in the last 168 hours.  Recent Results (from the past 240 hour(s))  MRSA PCR Screening     Status: None   Collection Time: 10/21/20 10:08 AM   Specimen: Nasal Mucosa; Nasopharyngeal  Result Value Ref Range Status   MRSA by PCR NEGATIVE NEGATIVE Final    Comment:        The GeneXpert MRSA Assay (FDA approved for NASAL specimens only), is one component of a comprehensive MRSA colonization surveillance program. It is not intended to diagnose MRSA infection nor to guide or monitor treatment for MRSA infections. Performed at [facility], [place] [address] [place], [place] [address]     Radiology Studies: No results found.  Scheduled Meds: . acetaminophen (TYLENOL) oral liquid 160 mg/5 mL  650 mg Per Tube Q6H  . amiodarone  400 mg Per Tube BID  . B-complex with vitamin C  1 tablet Per Tube Daily  . Chlorhexidine Gluconate Cloth  6 each Topical Q1200  . feeding supplement (NEPRO CARB STEADY)  237 mL Oral TID BM  . Gerhardt's butt cream   Topical TID  . insulin aspart  3-9 Units Subcutaneous Q4H  . lidocaine  1 patch Transdermal Q24H  . mouth rinse  15 mL Mouth Rinse BID  . phenol  1 spray Mouth/Throat Once  . predniSONE  40 mg Per Tube Q breakfast  . sodium chloride flush  10-40 mL Intracatheter Q12H  . sodium chloride flush  3 mL Intravenous Q12H  . triamcinolone 0.1 % cream : eucerin   Topical BID   Continuous Infusions: . sodium chloride Stopped (10/27/20 0931)  . linezolid (ZYVOX) IV 600 mg (10/30/20 2204)  . meropenem (MERREM) IV 200 mL/hr at 10/31/20 0528     LOS: 17 days   Time spent: 1min  Raymond Hurley C  Lawton Dollinger, DO [facility]  If 7PM-7AM, please contact night-coverage [email or web address]  10/31/2020, 7:40 AM

## 2020-10-31 NOTE — Progress Notes (Addendum)
Occupational Therapy Treatment Patient Details Name: Raymond Hurley. MRN: [id number] DOB: 05/02/1931 Today's Date: 10/31/2020    History of present illness Pt is an 87 y.o. male admitted 10/14/20 with hypotension, diarrhea, COVID-19. Course complicated by AMS, aspiration PNA, multiple organ failure, pancreatitis. New onset afib with RVR 1/4 and transfer to ICU. CRRT 1/4-1/12; intermittent HD initiated 1/15. PMH includes metastatic melanoma, afib, CHF.   OT comments  Pt making slow progress towards OT goals. Today with limitations including notable weakness, decreased endurance and balance strategies. Pt requiring maxA for bed mobility and requiring two person assist for sit<>stand from EOB (NT present and assisting). Pt unable to tolerate taking steps given LE weakness therefore use of Stedy for OOB to recliner. VSS throughout. Given current status continue to recommend SNF level therapies at time of discharge. Acute OT to follow - acute OT goals/goal date updated today.    Follow Up Recommendations  SNF;Supervision/Assistance - 24 hour    Equipment Recommendations  Other (comment);3 in 1 bedside commode (TBD in next venue)          Precautions / Restrictions Precautions Precautions: Fall Restrictions Weight Bearing Restrictions: No       Mobility Bed Mobility Overal bed mobility: Needs Assistance Bed Mobility: Supine to Sit     Supine to sit: Max assist;+2 for safety/equipment     General bed mobility comments: pt attempts to initiate LEs towards EOB but requires assist to fully complete given weakness, assist to scoot hips and for trunk elevation  Transfers Overall transfer level: Needs assistance Equipment used: 2 person hand held assist;Ambulation equipment used Transfers: Sit to/from Stand Sit to Stand: Max assist;+2 physical assistance;+2 safety/equipment;From elevated surface         General transfer comment: initially attempting to stand with +2 via face to  face from EOB. pt unable to fully power through/extend LEs to reach full upright position given notable weakness, returned to sitting and use of Stedy for safe transfer OOB to recliner. VCs for safe hand placement with assist to boost and steady in standing    Balance Overall balance assessment: Needs assistance Sitting-balance support: Feet supported;Bilateral upper extremity supported Sitting balance-Leahy Scale: Fair     Standing balance support: Bilateral upper extremity supported Standing balance-Leahy Scale: Poor Standing balance comment: external assist                           ADL either performed or assessed with clinical judgement   ADL Overall ADL's : Needs assistance/impaired                             Toileting- Clothing Manipulation and Hygiene: Total assistance;+2 for physical assistance;Bed level Toileting - Clothing Manipulation Details (indicate cue type and reason): pt on bed pan upon arrival, totalA for pericare after     Functional mobility during ADLs: Maximal assistance;+2 for physical assistance;+2 for safety/equipment (sit<>stand at [facility])                         Cognition Arousal/Alertness: Awake/alert Behavior During Therapy: Flat affect;WFL for tasks assessed/performed Overall Cognitive Status: Impaired/Different from baseline Area of Impairment: Awareness;Problem solving                           Awareness: Emergent Problem Solving: Slow processing;Requires verbal cues  Exercises     Shoulder Instructions       General Comments VSS on RA    Pertinent Vitals/ Pain       Pain Assessment: Faces Faces Pain Scale: Hurts even more Pain Location: bottom Pain Descriptors / Indicators: Discomfort Pain Intervention(s): Monitored during session;Repositioned  Home Living                                          Prior Functioning/Environment              Frequency  Min  2X/week        Progress Toward Goals  OT Goals(current goals can now be found in the care plan section)  Progress towards OT goals: Progressing toward goals  Acute Rehab OT Goals Patient Stated Goal: get more comfortable, OT Goal Formulation: With patient Time For Goal Achievement: 11/14/20 Potential to Achieve Goals: Good  Plan Discharge plan remains appropriate    Co-evaluation                 AM-PAC OT "6 Clicks" Daily Activity     Outcome Measure   Help from another person eating meals?: A Little Help from another person taking care of personal grooming?: A Little Help from another person toileting, which includes using toliet, bedpan, or urinal?: Total Help from another person bathing (including washing, rinsing, drying)?: A Lot Help from another person to put on and taking off regular upper body clothing?: A Little Help from another person to put on and taking off regular lower body clothing?: A Lot 6 Click Score: 14    End of Session    OT Visit Diagnosis: Unsteadiness on feet (R26.81);Muscle weakness (generalized) (M62.81);Other symptoms and signs involving cognitive function   Activity Tolerance Patient tolerated treatment well;Patient limited by fatigue   Patient Left in chair;with call bell/phone within reach;with chair alarm set   Nurse Communication Mobility status;Need for lift equipment        Time: 7322-0254 OT Time Calculation (min): 42 min  Charges: OT General Charges $OT Visit: 1 Visit OT Treatments $Self Care/Home Management : 38-52 mins  Lou Cal, OT Acute Rehabilitation Services Pager [phone] Office [phone]    Raymond Hurley 10/31/2020, 12:26 PM

## 2020-10-31 NOTE — Progress Notes (Signed)
HOSPITAL MEDICINE OVERNIGHT EVENT NOTE    Notified by nursing that hemoglobin this morning was 6.1.  Per my discussion with nursing, patient is exhibiting no further episodes of hematochezia, at least during this shift.  Patient is hemodynamically stable.  Patient denies chest pain or shortness of breath.  Ordering repeat type and screen.  Ordering 1 unit packed red blood cell transfusion transfused slowly over 4 hours with posttransfusion hemoglobin and hematocrit.  Vernelle Emerald  MD [facility]

## 2020-10-31 NOTE — Progress Notes (Signed)
[facility] Progress Note     Assessment/ Plan:   1. Dialysis dependent AKI 2/2 ATN 1. CRRT 1/4-1/12. And now tol iHD  2. Has tol HD, agree is marginal candidate, cont HD for now 3. Will need to move towards long term access 4. 2K, 3h, HD cath, 1-2L UF, no Heparin  2. Acute pancreatitis: Noted on CT scan 10/16/20 and again on 1/7. 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 Mgmt per primary service  3. Acute hypoxemic RF: likely aspiration PNA +/- COVID pneumonitis, stable o2 req. Steroids.   4. Shock: high WBC count, w/u per primary team. Possible ilacus abscess on CT from 1/7. Off levophed 1/11. Mero, zyvox  5. COVID-19 infection: vaccinated and boosted- treatment per primary  6. Acute encephalopathy: multifactorial, likely COVID + acute illness. CTM  8.  Chronic systolic CHF: EF 02/14/96 on TTE 60-65% with no WMA.  9.  Afib: hep and amiodarone po  10. Acute anemia - has received multiple transfusions with hematochezia as well, per primary service, no heparin on hd  Subjective:   OOB to chair this AM Tol HD yesterday, no UF   Objective:   BP 113/67 (BP Location: Left Arm)   Pulse 77   Temp 97.8 F (36.6 C) (Oral)   Resp (!) 21   Ht 5\' 8"  (1.727 m)   Wt 94.6 kg   SpO2 95%   BMI 31.71 kg/m   Intake/Output Summary (Last 24 hours) at 10/31/2020 1356 Last data filed at 10/31/2020 1058 Gross per 24 hour  Intake 482.97 ml  Output 3 ml  Net 479.97 ml   Weight change: -0.8 kg  Physical Exam: GEN: NAD, NCAT HEENT: No conjunctival pallor, EOMI NECK: Supple, no thyromegaly LUNGS: CTA B/L  rhonchi present CV: RRR, No M/R/G ABD: SNDNT +BS, brown tool in rectal tube  EXT: No lower extremity edema ACCESS: RIJ temp  Imaging: No results found.  Labs: BMET Recent Labs  Lab 10/28/20 0151 10/28/20 1559 10/29/20 0313 10/29/20 1433 10/30/20 0308 10/30/20 2158 10/31/20 0232   NA 134* 134* 133* 132* 132* 135 135  K 3.9 4.6 4.9 5.2* 5.4* 4.1 4.3  CL 99 98 97* 99 97* 97* 97*  CO2 23 21* 20* 17* 17* 22 21*  GLUCOSE 103* 234* 125* 205* 110* 129* 95  BUN 62* 83* 95* 107* 122* 67* 72*  CREATININE 3.89* 4.88* 5.67* 6.31* 7.10* 4.50* 4.82*  CALCIUM 7.6* 7.8* 7.7* 7.4* 7.7* 7.2* 7.2*  PHOS 4.6 6.4* 8.5* 9.8* 11.6* 8.2* 8.9*   CBC Recent Labs  Lab 10/28/20 0151 10/29/20 0313 10/29/20 1433 10/29/20 2130 10/30/20 0308 10/31/20 0232 10/31/20 1235  WBC 55.5* 57.3*  --   --  47.3* 49.3*  --   HGB 7.5* 6.0*   < > 7.2* 7.4* 6.1* 7.3*  HCT 21.8* 18.1*   < > 22.5* 22.7* 18.3* 21.6*  MCV 100.5* 102.3*  --   --  95.4 95.3  --   PLT 83* 82*  --   --  75* 63*  --    < > = values in this interval not displayed.    Medications:    . acetaminophen (TYLENOL) oral liquid 160 mg/5 mL  650 mg Per Tube Q6H  . amiodarone  400 mg Per Tube BID  . B-complex with vitamin C  1 tablet Per Tube Daily  . Chlorhexidine Gluconate Cloth  6 each Topical Q1200  . feeding  supplement (NEPRO CARB STEADY)  237 mL Oral TID BM  . Gerhardt's butt cream   Topical TID  . insulin aspart  3-9 Units Subcutaneous Q4H  . lidocaine  1 patch Transdermal Q24H  . mouth rinse  15 mL Mouth Rinse BID  . phenol  1 spray Mouth/Throat Once  . predniSONE  40 mg Per Tube Q breakfast  . sodium chloride flush  10-40 mL Intracatheter Q12H  . sodium chloride flush  3 mL Intravenous Q12H  . triamcinolone 0.1 % cream : eucerin   Topical BID    Rexene Agent, MD  10/31/2020, 1:56 PM

## 2020-10-31 NOTE — Progress Notes (Addendum)
Pt is on D19 of total abx for iliopsoas abscess. D/w Dr Avon Gully and we will dc Merrem and linezolid. We will just monitor and f/u with goal of care.   Onnie Boer, PharmD, BCIDP, AAHIVP, CPP Infectious Disease Pharmacist 10/31/2020 11:34 AM

## 2020-10-31 NOTE — Progress Notes (Signed)
  Speech Language Pathology Treatment: Dysphagia  Patient Details Name: Raymond Hurley. MRN: [id number] DOB: 02-Dec-1930 Today's Date: 10/31/2020 Time: 7711-6579 SLP Time Calculation (min) (ACUTE ONLY): 18 min  Assessment / Plan / Recommendation Clinical Impression  Pt demonstrates improved arousal and attention and interest in PO in comparison with session last week. He has recognized thin vs thickened liquids and requested thin coffee. With initial sip pt took a larger sips and had an immediate cough, which he recognized and stated he drank too much too fast. All subsequent sips were small and controlled without signs of aspiration. Recommend MBS when contract precautions are over given concern for aspiration on admit and ongoing intermittent signs. Pt to continue nectar and dys 3, but can also have sips of thin water or coffee at desired.   HPI HPI: Pt is an 87 y.o. male with medical history significant for atrial fibrillation on chronic anticoagulation, systolic CHF, melanoma, and vitamin B12 deficiency who presented after being found to be acutely altered with fever four days prior to admission. CXR 1/2: Stable small left pleural effusion. Left retrocardiac opacity with increased volume loss, indicating left lower lobe atelectasis with possible component of pneumonia. Found to have [place]. MRI brain negative.      SLP Plan  Continue with current plan of care       Recommendations  Diet recommendations: Thin liquid;Nectar-thick liquid;Dysphagia 3 (mechanical soft) Liquids provided via: Cup;No straw Medication Administration: Whole meds with puree Supervision: Staff to assist with self feeding Compensations: Slow rate;Small sips/bites                Oral Care Recommendations: Oral care BID Follow up Recommendations: Inpatient Rehab SLP Visit Diagnosis: Dysphagia, unspecified (R13.10) Plan: Continue with current plan of care       GO                Elim Economou, Katherene Ponto 10/31/2020, 9:29 AM

## 2020-11-01 DIAGNOSIS — R6520 Severe sepsis without septic shock: Secondary | ICD-10-CM | POA: Diagnosis not present

## 2020-11-01 DIAGNOSIS — A419 Sepsis, unspecified organism: Secondary | ICD-10-CM | POA: Diagnosis not present

## 2020-11-01 LAB — GLUCOSE, CAPILLARY
Glucose-Capillary: 74 mg/dL (ref 70–99)
Glucose-Capillary: 85 mg/dL (ref 70–99)
Glucose-Capillary: 96 mg/dL (ref 70–99)
Glucose-Capillary: 114 mg/dL — ABNORMAL HIGH (ref 70–99)
Glucose-Capillary: 109 mg/dL — ABNORMAL HIGH (ref 70–99)

## 2020-11-01 LAB — RENAL FUNCTION PANEL
Albumin: 1.9 g/dL — ABNORMAL LOW (ref 3.5–5.0)
Anion gap: 18 — ABNORMAL HIGH (ref 5–15)
BUN: 101 mg/dL — ABNORMAL HIGH (ref 8–23)
CO2: 19 mmol/L — ABNORMAL LOW (ref 22–32)
Calcium: 7.7 mg/dL — ABNORMAL LOW (ref 8.9–10.3)
Chloride: 96 mmol/L — ABNORMAL LOW (ref 98–111)
Creatinine, Ser: 6.4 mg/dL — ABNORMAL HIGH (ref 0.61–1.24)
GFR, Estimated: 8 mL/min — ABNORMAL LOW (ref >60)
Glucose, Bld: 75 mg/dL (ref 70–99)
Phosphorus: 11.2 mg/dL — ABNORMAL HIGH (ref 2.5–4.6)
Potassium: 4.9 mmol/L (ref 3.5–5.1)
Sodium: 133 mmol/L — ABNORMAL LOW (ref 135–145)

## 2020-11-01 LAB — CBC
HCT: 20.6 % — ABNORMAL LOW (ref 39.0–52.0)
Hemoglobin: 6.7 g/dL — CL (ref 13.0–17.0)
MCH: 31 pg (ref 26.0–34.0)
MCHC: 32.5 g/dL (ref 30.0–36.0)
MCV: 95.4 fL (ref 80.0–100.0)
Platelets: 65 10*3/uL — ABNORMAL LOW (ref 150–400)
RBC: 2.16 MIL/uL — ABNORMAL LOW (ref 4.22–5.81)
RDW: 19.2 % — ABNORMAL HIGH (ref 11.5–15.5)
WBC: 44.9 10*3/uL — ABNORMAL HIGH (ref 4.0–10.5)
nRBC: 0.2 % (ref 0.0–0.2)

## 2020-11-01 LAB — OCCULT BLOOD X 1 CARD TO LAB, STOOL: Fecal Occult Bld: POSITIVE — AB

## 2020-11-01 LAB — PREPARE RBC (CROSSMATCH)

## 2020-11-01 LAB — HEMOGLOBIN AND HEMATOCRIT, BLOOD
HCT: 23.7 % — ABNORMAL LOW (ref 39.0–52.0)
Hemoglobin: 7.8 g/dL — ABNORMAL LOW (ref 13.0–17.0)

## 2020-11-01 LAB — MAGNESIUM: Magnesium: 2.4 mg/dL (ref 1.7–2.4)

## 2020-11-01 MED ORDER — HEPARIN SODIUM (PORCINE) 1000 UNIT/ML IJ SOLN
INTRAMUSCULAR | Status: AC
  Filled 2020-11-01: qty 4

## 2020-11-01 MED ORDER — SODIUM CHLORIDE 0.9% IV SOLUTION: Freq: Once | INTRAVENOUS | Status: AC

## 2020-11-01 NOTE — Progress Notes (Addendum)
Per night shift RN Mendel Ryder, there is no blood IV tubing on the unit, so the patient will be transfused while in HD. Dr.[place] aware.

## 2020-11-01 NOTE — Progress Notes (Signed)
PROGRESS NOTE    Raymond Hurley.  VHQ:[id number] DOB: [birth year] DOA: 10/14/2020 PCP: Lajean Manes, MD   Brief Narrative:  87 year old man who is hypotensive in the context of acute diarrhea on 1/1 was admitted w/ dx of COVID and acute dehydration from acute diarrheal illness. Of note he was fully vaccinated and had received his booster. Given worsening delirium, multiple organ failure, and profound hypotension with CT abdomen showing pancreatitis.  Given advanced age and multiple organ dysfunction in addition to concern about further clinical decline patient was transitioned to critical care team.  1/4 > hypotension and A.fib RVR.  Transferring to ICU. Started CRRT 1/7 > Worsening leukocytosis.  CT abdomen pelvis with severe pancreatitis, possible iliopsoas abscess.  Deemed too unstable for intervention per surgery and interventional radiology 1/8 > started pressors.  Antibiotics broadened to meropenem and vancomycin.  Made DNR 1/12 > stopped CRRT 1/13 > iHD 1/14-15 > passed speech evaluation, tolerating p.o. well, NG tube removed 1/16 > acutely overnight noted to have hematochezia, hemoglobin 6.0, -Heparin drip stopped, hypotensive, concern for worsening clinical deterioration remains high risk for decompensation - 2u PRBC given 1/17 > Hgb improved - plan for HD if BP holds; Palliative to follow along given tenuous status. 1/18 > repeat hemoglobin drop - GI bleed resolved - this may be due to advancing kidney disease; poor prognosticator - family updated over group video chat at bedside 1/19 > Hgb continues to drop - FOBT positive - normal stool appearance  Assessment & Plan:   Principal Problem:   Severe sepsis ([place]) Active Problems:   PAF (paroxysmal atrial fibrillation) (HCC)   Anticoagulated   Pneumonia due to COVID-19 virus   Chronic systolic CHF (congestive heart failure) (HCC)   Elevated troponin   Facial weakness   Hypocalcemia   Macrocytic anemia   Acute metabolic  encephalopathy   Acute respiratory failure (HCC)   AKI (acute kidney injury) ([place])   Dehydration   Persistent atrial fibrillation ([place])  Goals of care discussion  - Lengthy discussion with patient's son over the phone, we discussed patient remains very high risk for decompensation - Patient's blood pressure improving with IV fluids and blood transfusions - We discussed that at this point given patient's anemia hypotension, COVID-19 pneumonia, abdominal infection and pancreatitis at his advanced age he remains extremely high risk for decompensation and would not benefit from any further aggressive measures including pressors, intubation or CPR. - Unfortunately given his age and comorbid diseases long-term hemodialysis will likely be limited in and of itself. - Palliative care following, pending the next 24 to 48 hours will likely need to discuss with family goals of care including but not limited to palliative care and hospice should patient continue to worsen  Acute blood loss anemia to ongoing GI bleed requiring multiple transfusions; Concurrent anemia of kidney disease, profound dehydration and acute illness - Hemoglobin <7 again this am - 1u PRBC with dialysis later today - Transfusion 1/12, 1/13, 1/16x2, 1/18, 1/19 (6 total since admission) - Stool brown but remains heme positive - will continue to follow - unlikely a candidate for endoscopy; will sideline GI to further discuss  Acute hypoxic respiratory failure 2/2 aspiration PNA, improved  Cannot rule out concurrent/resolving COVID-19 infection  - On room air at rest - Continue early ambulation, incentive spirometry, flutter - Continue steroids - wean as tolelrated  Septic shock, resolved Possible iliac abscess - Previous team discussed with IR/CCS on 1/7, no surgery or drain indicated -  Meropenem/linezolid completed - follow for fever curve/symptoms. Will likely need to involve ID if concern for worsening/re-infection given  profound antibiotic use.  Acute metabolic encephalopathy, multifactorial: In the setting of uremia, septic shock, pancreatitis, anemia and COVID-19 infection Resolved - Continue supportive care, delirium precautions - Mental status appears to be improving drastically ANOx4 this morning; very appropriate questions  Acute renal failure requiring RRT Hypophosphatemia, mild - Suspect secondary to ATN after recent diarrheal illness and volume depletion in the setting of profound disease, septic shock, COVID infection - Nephrology following - CRRT clotted off 10/25/20 - HD MWF ongoing as tolerated  Acute pancreatitis, resolving - Continue pain control, supportive care, leukocytosis still markedly elevated - NG tube/feeds removed 10/28/20 - tolerate PO well, passed SLP eval - Rectal tube to be removed 10/31/20 - more formed stool with questionable abdominal fullness this morning on exam  Afib, likely paroxysmal; questionably provoked Systolic cardiomyopathy Echo 1/3 with EF 55 - 60% - In and out of A. fib over the hospitalization  - Heparin drip stopped due to hematochezia 1/16 - Continue PO amio  DVT prophylaxis: SCDs only Heparin drip discontinued 10/29/2020 Code Status: DNR Family Communication: Updated over video conference at length about poor prognosis, active bleed and ongoing management  Status is: Inpatient  Dispo: The patient is from: Home              Anticipated d/Raymond is to: To be determined              Anticipated d/Raymond date is: > 72 hours              Patient currently not medically stable for discharge  Consultants:   PCCM, nephrology, GI, cardiology, neurology, palliative care  Antimicrobials:  Unasyn 1/2 >> 1/4 Cefepime 1/4 >> 1/7 Flagyl 1/6 >> 1/7 Vancomycin 1/8 > 1/11  Meropenem 1/7 >> 1/18 Zyvox 1/12>  1/18  Subjective: Acutely worsening hemoglobin overnight again, no further signs of hematochezia melena but heme positive stool.  Patient clinically feels  well this morning while being transfused with blood, otherwise denies shortness of breath nausea vomiting.  He also comments on abdominal "fullness" which is likely more formed stool unable to pass through rectal tube, we discussed and patient agreed for rectal tube removal later today.  Objective: Vitals:   11/01/20 1700 11/01/20 1715 11/01/20 1720 11/01/20 1727  BP: (!) 83/44  (!) 87/41 (!) 89/46  Pulse: 80   (!) 57  Resp: 20 18 19 19   Temp:   97.8 F (36.6 Raymond) 97.8 F (36.6 Raymond)  TempSrc:   Oral   SpO2: 94%     Weight:      Height:        Intake/Output Summary (Last 24 hours) at 11/01/2020 1748 Last data filed at 11/01/2020 1409 Gross per 24 hour  Intake 480 ml  Output 1 ml  Net 479 ml   Filed Weights   10/30/20 0500 10/30/20 1435 10/30/20 1740  Weight: 96.3 kg 95.5 kg 94.6 kg    Examination:  General: Elderly frail gentleman sitting up in bed tolerating p.o. without any difficulty HEENT: Multiple excoriations and scabs to the face, sclerae nonicteric, noninjected.  Extraocular movements intact bilaterally. Neck: Right IJ dialysis access noted, bandage clean dry intact. Lungs: Diminished bilaterally without overt rhonchi, wheeze, or rales. Heart:  Regular rate and rhythm.  Without murmurs, rubs, or gallops. Abdomen:  Soft, nontender minimally distended, fullness with palpation to lower abdomen bilaterally.  Without guarding or rebound.  Rectal tube draining green/brown stool. Extremities: Without cyanosis, clubbing, edema, or obvious deformity. Vascular:  Dorsalis pedis and posterior tibial pulses palpable bilaterally. Skin:  Warm and dry, no erythema, no ulcerations.  Data Reviewed: I have personally reviewed following labs and imaging studies  CBC: Recent Labs  Lab 10/28/20 0151 10/29/20 0313 10/29/20 1433 10/29/20 2130 10/30/20 0308 10/31/20 0232 10/31/20 1235 11/01/20 0326  WBC 55.5* 57.3*  --   --  47.3* 49.3*  --  44.9*  HGB 7.5* 6.0*   < > 7.2* 7.4* 6.1*  7.3* 6.7*  HCT 21.8* 18.1*   < > 22.5* 22.7* 18.3* 21.6* 20.6*  MCV 100.5* 102.3*  --   --  95.4 95.3  --  95.4  PLT 83* 82*  --   --  75* 63*  --  65*   < > = values in this interval not displayed.   Basic Metabolic Panel: Recent Labs  Lab 10/28/20 0151 10/28/20 1559 10/29/20 0313 10/29/20 1433 10/30/20 0308 10/30/20 2158 10/31/20 0232 10/31/20 1630 11/01/20 0326  NA 134*   < > 133*   < > 132* 135 135 133* 133*  K 3.9   < > 4.9   < > 5.4* 4.1 4.3 4.8 4.9  CL 99   < > 97*   < > 97* 97* 97* 95* 96*  CO2 23   < > 20*   < > 17* 22 21* 20* 19*  GLUCOSE 103*   < > 125*   < > 110* 129* 95 130* 75  BUN 62*   < > 95*   < > 122* 67* 72* 89* 101*  CREATININE 3.89*   < > 5.67*   < > 7.10* 4.50* 4.82* 5.71* 6.40*  CALCIUM 7.6*   < > 7.7*   < > 7.7* 7.2* 7.2* 7.5* 7.7*  MG 2.2  --  2.5*  --  2.4  --  2.1  --  2.4  PHOS 4.6   < > 8.5*   < > 11.6* 8.2* 8.9* 10.1* 11.2*   < > = values in this interval not displayed.   GFR: Estimated Creatinine Clearance: 9.1 mL/min (A) (by Raymond-G formula based on SCr of 6.4 mg/dL (H)). Liver Function Tests: Recent Labs  Lab 10/30/20 0308 10/30/20 2158 10/31/20 0232 10/31/20 1630 11/01/20 0326  ALBUMIN 2.0* 1.8* 1.8* 1.9* 1.9*   No results for input(s): LIPASE, AMYLASE in the last 168 hours. No results for input(s): AMMONIA in the last 168 hours. Coagulation Profile: No results for input(s): INR, PROTIME in the last 168 hours. Cardiac Enzymes: No results for input(s): CKTOTAL, CKMB, CKMBINDEX, TROPONINI in the last 168 hours. BNP (last 3 results) No results for input(s): PROBNP in the last 8760 hours. HbA1C: No results for input(s): HGBA1C in the last 72 hours. CBG: Recent Labs  Lab 10/31/20 2330 10/31/20 2346 11/01/20 0353 11/01/20 0748 11/01/20 1201  GLUCAP 121* 136* 74 85 96   Lipid Profile: No results for input(s): CHOL, HDL, LDLCALC, TRIG, CHOLHDL, LDLDIRECT in the last 72 hours. Thyroid Function Tests: No results for input(s): TSH,  T4TOTAL, FREET4, T3FREE, THYROIDAB in the last 72 hours. Anemia Panel: No results for input(s): VITAMINB12, FOLATE, FERRITIN, TIBC, IRON, RETICCTPCT in the last 72 hours. Sepsis Labs: No results for input(s): PROCALCITON, LATICACIDVEN in the last 168 hours.  No results found for this or any previous visit (from the past 240 hour(s)).  Radiology Studies: No results found.  Scheduled Meds: . sodium chloride   Intravenous  Once  . acetaminophen (TYLENOL) oral liquid 160 mg/5 mL  650 mg Per Tube Q6H  . amiodarone  400 mg Per Tube BID  . B-complex with vitamin Raymond  1 tablet Per Tube Daily  . Chlorhexidine Gluconate Cloth  6 each Topical Q1200  . feeding supplement (NEPRO CARB STEADY)  237 mL Oral TID BM  . Gerhardt's butt cream   Topical TID  . heparin sodium (porcine)      . insulin aspart  3-9 Units Subcutaneous Q4H  . lidocaine  1 patch Transdermal Q24H  . mouth rinse  15 mL Mouth Rinse BID  . phenol  1 spray Mouth/Throat Once  . predniSONE  40 mg Per Tube Q breakfast  . sodium chloride flush  10-40 mL Intracatheter Q12H  . sodium chloride flush  3 mL Intravenous Q12H  . triamcinolone 0.1 % cream : eucerin   Topical BID   Continuous Infusions: . sodium chloride Stopped (10/27/20 0931)     LOS: 18 days   Time spent: 59min  Raymond Hurley Raymond Evaleigh Mccamy, DO [facility]  If 7PM-7AM, please contact night-coverage [email or web address]  11/01/2020, 5:48 PM

## 2020-11-01 NOTE — Progress Notes (Signed)
HOSPITAL MEDICINE OVERNIGHT EVENT NOTE    Notified by nursing that patient's hemoglobin is 6.7.    No clinical evidence of gross bleeding according to nursing.  Patient is hemodynamically stable.  Patient is denying chest pain or shortness of breath.  Patient is on room air.  Patient unfortunately has required multiple blood transfusions during this hospitalization, this will be his sixth unit of packed red blood cells during this hospitalization.  Will transfuse 1 unit packed red blood cells and order a posttransfusion H&H.  Vernelle Emerald  MD [facility]

## 2020-11-01 NOTE — Progress Notes (Signed)
Report given over the phone to HD nurse. Nurse verified that 1 unit PRBC's will be given during HD session.

## 2020-11-01 NOTE — Progress Notes (Signed)
[facility] Progress Note     Assessment/ Plan:   1. Dialysis dependent AKI 2/2 ATN 1. CRRT 1/4-1/12. And now tol iHD  2. Has tol HD, agree is marginal candidate, cont HD for now 3. Will need to move towards long term access 4. 2K, 3h, HD cath, 1-2L UF, no Heparin on MWF shift  2. Acute pancreatitis: Noted on CT scan 10/16/20 and again on 1/7. 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 Mgmt per primary service  3. Acute hypoxemic RF: likely aspiration PNA +/- COVID pneumonitis, stable o2 req. Steroids.   4. Shock: high WBC count, w/u per primary team. Possible ilacus abscess on CT from 1/7. Off levophed 1/11. Mero, zyvox  5. COVID-19 infection: vaccinated and boosted- treatment per primary  6. Acute encephalopathy: multifactorial, likely COVID + acute illness. CTM  8.  Chronic systolic CHF: EF 6/0/73 on TTE 60-65% with no WMA.  9.  Afib: hep and amiodarone po  10. Acute anemia - has received multiple transfusions with hematochezia as well, per primary service, no heparin on hd; hx/o melanoma is remove, might need to consider ESA here.   Subjective:   No co feel ok today   Objective:   BP (!) 119/51 (BP Location: Right Arm)   Pulse 79   Temp 98.1 F (36.7 C) (Axillary)   Resp 18   Ht 5\' 8"  (1.727 m)   Wt 94.6 kg   SpO2 96%   BMI 31.71 kg/m   Intake/Output Summary (Last 24 hours) at 11/01/2020 1424 Last data filed at 11/01/2020 1409 Gross per 24 hour  Intake 1480 ml  Output 1 ml  Net 1479 ml   Weight change:   Physical Exam: GEN: NAD, NCAT HEENT: No conjunctival pallor, EOMI NECK: Supple, no thyromegaly LUNGS: CTA B/L  rhonchi present CV: RRR, No M/R/G ABD: SNDNT +BS, brown tool in rectal tube  EXT: No lower extremity edema ACCESS: RIJ temp  Imaging: No results found.  Labs: BMET Recent Labs  Lab 10/29/20 0313 10/29/20 1433 10/30/20 0308 10/30/20 2158  10/31/20 0232 10/31/20 1630 11/01/20 0326  NA 133* 132* 132* 135 135 133* 133*  K 4.9 5.2* 5.4* 4.1 4.3 4.8 4.9  CL 97* 99 97* 97* 97* 95* 96*  CO2 20* 17* 17* 22 21* 20* 19*  GLUCOSE 125* 205* 110* 129* 95 130* 75  BUN 95* 107* 122* 67* 72* 89* 101*  CREATININE 5.67* 6.31* 7.10* 4.50* 4.82* 5.71* 6.40*  CALCIUM 7.7* 7.4* 7.7* 7.2* 7.2* 7.5* 7.7*  PHOS 8.5* 9.8* 11.6* 8.2* 8.9* 10.1* 11.2*   CBC Recent Labs  Lab 10/29/20 0313 10/29/20 1433 10/30/20 0308 10/31/20 0232 10/31/20 1235 11/01/20 0326  WBC 57.3*  --  47.3* 49.3*  --  44.9*  HGB 6.0*   < > 7.4* 6.1* 7.3* 6.7*  HCT 18.1*   < > 22.7* 18.3* 21.6* 20.6*  MCV 102.3*  --  95.4 95.3  --  95.4  PLT 82*  --  75* 63*  --  65*   < > = values in this interval not displayed.    Medications:    . sodium chloride   Intravenous Once  . acetaminophen (TYLENOL) oral liquid 160 mg/5 mL  650 mg Per Tube Q6H  . amiodarone  400 mg Per Tube BID  . B-complex with vitamin C  1 tablet Per Tube Daily  . Chlorhexidine Gluconate Cloth  6 each Topical Q1200  .  feeding supplement (NEPRO CARB STEADY)  237 mL Oral TID BM  . Gerhardt's butt cream   Topical TID  . insulin aspart  3-9 Units Subcutaneous Q4H  . lidocaine  1 patch Transdermal Q24H  . mouth rinse  15 mL Mouth Rinse BID  . phenol  1 spray Mouth/Throat Once  . predniSONE  40 mg Per Tube Q breakfast  . sodium chloride flush  10-40 mL Intracatheter Q12H  . sodium chloride flush  3 mL Intravenous Q12H  . triamcinolone 0.1 % cream : eucerin   Topical BID    Rexene Agent, MD  11/01/2020, 2:24 PM

## 2020-11-02 DIAGNOSIS — R6520 Severe sepsis without septic shock: Secondary | ICD-10-CM | POA: Diagnosis not present

## 2020-11-02 DIAGNOSIS — R531 Weakness: Secondary | ICD-10-CM

## 2020-11-02 DIAGNOSIS — Z515 Encounter for palliative care: Secondary | ICD-10-CM | POA: Diagnosis not present

## 2020-11-02 DIAGNOSIS — N179 Acute kidney failure, unspecified: Secondary | ICD-10-CM | POA: Diagnosis not present

## 2020-11-02 DIAGNOSIS — A419 Sepsis, unspecified organism: Secondary | ICD-10-CM | POA: Diagnosis not present

## 2020-11-02 LAB — RENAL FUNCTION PANEL
Albumin: 1.8 g/dL — ABNORMAL LOW (ref 3.5–5.0)
Albumin: 1.8 g/dL — ABNORMAL LOW (ref 3.5–5.0)
Anion gap: 15 (ref 5–15)
Anion gap: 17 — ABNORMAL HIGH (ref 5–15)
BUN: 69 mg/dL — ABNORMAL HIGH (ref 8–23)
BUN: 81 mg/dL — ABNORMAL HIGH (ref 8–23)
CO2: 23 mmol/L (ref 22–32)
CO2: 20 mmol/L — ABNORMAL LOW (ref 22–32)
Calcium: 7.6 mg/dL — ABNORMAL LOW (ref 8.9–10.3)
Calcium: 7.7 mg/dL — ABNORMAL LOW (ref 8.9–10.3)
Chloride: 99 mmol/L (ref 98–111)
Chloride: 100 mmol/L (ref 98–111)
Creatinine, Ser: 4.98 mg/dL — ABNORMAL HIGH (ref 0.61–1.24)
Creatinine, Ser: 5.88 mg/dL — ABNORMAL HIGH (ref 0.61–1.24)
GFR, Estimated: 11 mL/min — ABNORMAL LOW (ref >60)
GFR, Estimated: 9 mL/min — ABNORMAL LOW (ref >60)
Glucose, Bld: 93 mg/dL (ref 70–99)
Glucose, Bld: 180 mg/dL — ABNORMAL HIGH (ref 70–99)
Phosphorus: 8.4 mg/dL — ABNORMAL HIGH (ref 2.5–4.6)
Phosphorus: 9.2 mg/dL — ABNORMAL HIGH (ref 2.5–4.6)
Potassium: 4.2 mmol/L (ref 3.5–5.1)
Potassium: 4.5 mmol/L (ref 3.5–5.1)
Sodium: 137 mmol/L (ref 135–145)
Sodium: 137 mmol/L (ref 135–145)

## 2020-11-02 LAB — CBC
HCT: 22.9 % — ABNORMAL LOW (ref 39.0–52.0)
Hemoglobin: 7.7 g/dL — ABNORMAL LOW (ref 13.0–17.0)
MCH: 31.2 pg (ref 26.0–34.0)
MCHC: 33.6 g/dL (ref 30.0–36.0)
MCV: 92.7 fL (ref 80.0–100.0)
Platelets: 52 10*3/uL — ABNORMAL LOW (ref 150–400)
RBC: 2.47 MIL/uL — ABNORMAL LOW (ref 4.22–5.81)
RDW: 18.7 % — ABNORMAL HIGH (ref 11.5–15.5)
WBC: 44.2 10*3/uL — ABNORMAL HIGH (ref 4.0–10.5)
nRBC: 0.2 % (ref 0.0–0.2)

## 2020-11-02 LAB — GLUCOSE, CAPILLARY
Glucose-Capillary: 99 mg/dL (ref 70–99)
Glucose-Capillary: 93 mg/dL (ref 70–99)
Glucose-Capillary: 124 mg/dL — ABNORMAL HIGH (ref 70–99)
Glucose-Capillary: 165 mg/dL — ABNORMAL HIGH (ref 70–99)
Glucose-Capillary: 106 mg/dL — ABNORMAL HIGH (ref 70–99)

## 2020-11-02 LAB — MAGNESIUM: Magnesium: 2.2 mg/dL (ref 1.7–2.4)

## 2020-11-02 MED ORDER — CEFAZOLIN SODIUM-DEXTROSE 2-4 GM/100ML-% IV SOLN
2.0000 g | INTRAVENOUS | Status: DC
  Filled 2020-11-02: qty 100

## 2020-11-02 NOTE — Progress Notes (Signed)
PROGRESS NOTE    Raymond Hurley.  XBL:[id number] DOB: 03/18/1931 DOA: 10/14/2020 PCP: Lajean Manes, MD   Brief Narrative:  87 year old man who is hypotensive in the context of acute diarrhea on 1/1 was admitted w/ dx of COVID and acute dehydration from acute diarrheal illness. Of note he was fully vaccinated and had received his booster. Given worsening delirium, multiple organ failure, and profound hypotension with CT abdomen showing pancreatitis.  Given advanced age and multiple organ dysfunction in addition to concern about further clinical decline patient was transitioned to critical care team.  1/4 > hypotension and A.fib RVR.  Transferring to ICU. Started CRRT 1/7 > Worsening leukocytosis.  CT abdomen pelvis with severe pancreatitis, possible iliopsoas abscess.  Deemed too unstable for intervention per surgery and interventional radiology 1/8 > started pressors.  Antibiotics broadened to meropenem and vancomycin.  Made DNR 1/12 > stopped CRRT 1/13 > iHD 1/14-15 > passed speech evaluation, tolerating p.o. well, NG tube removed 1/16 > acutely overnight noted to have hematochezia, hemoglobin 6.0, -Heparin drip stopped, hypotensive, concern for worsening clinical deterioration remains high risk for decompensation - 2u PRBC given 1/17 > Hgb improved - plan for HD if BP holds; Palliative to follow along given tenuous status. 1/18 > repeat hemoglobin drop - GI bleed resolved - this may be due to advancing kidney disease; poor prognosticator - family updated over group video chat at bedside 1/19 > Hgb continues to drop - FOBT positive - normal stool appearance 1/20 > Hgb stable - family/patient want to continue to pursue fairly aggressive medical management to give patient the best chance for discharge back home  Assessment & Plan:   Principal Problem:   Severe sepsis ([place]) Active Problems:   PAF (paroxysmal atrial fibrillation) (HCC)   Anticoagulated   Pneumonia due to COVID-19  virus   Chronic systolic CHF (congestive heart failure) (HCC)   Elevated troponin   Facial weakness   Hypocalcemia   Macrocytic anemia   Acute metabolic encephalopathy   Acute respiratory failure (HCC)   AKI (acute kidney injury) ([place])   Dehydration   Persistent atrial fibrillation ([place])  Goals of care discussion  - Lengthy discussion with patient's son over the phone, we discussed patient remains very high risk for decompensation - Patient's blood pressure improving with IV fluids and blood transfusions - We discussed that at this point given patient's anemia hypotension, COVID-19 pneumonia, abdominal infection and pancreatitis at his advanced age he remains extremely high risk for decompensation and would not benefit from any further aggressive measures including pressors, intubation or CPR. - Unfortunately given his age and comorbid diseases long-term hemodialysis will likely be limited in and of itself. - Family and patient continue to wish for "full court press" per son and as such we will continue to pursue hemodialysis, infection control and close follow-up with oncology for metastatic disease.  Acute blood loss anemia to ongoing GI bleed requiring multiple transfusions; Concurrent anemia of kidney disease, profound dehydration and acute illness - Hemoglobin <7 again this am - 1u PRBC with dialysis later today - Transfusion 1/12, 1/13, 1/16x2, 1/18, 1/19 (6 total since admission) - Stool brown but remains heme positive - will continue to follow - not a candidate for endoscopy currently given stable labs and high risk for procedure.  Acute hypoxic respiratory failure 2/2 aspiration PNA, resolved Cannot rule out concurrent/resolving COVID-19 infection  - On room air at rest - Continue early ambulation, incentive spirometry, flutter - Continue steroids -  wean as tolelrated  Septic shock, resolved Possible iliac abscess - Previous team discussed with IR/CCS on 1/7, no surgery or  drain indicated - Meropenem/linezolid completed - follow for fever curve/symptoms. Will likely need to involve ID if concern for worsening/re-infection given profound antibiotic use.  Acute metabolic encephalopathy, multifactorial: In the setting of uremia, septic shock, pancreatitis, anemia and COVID-19 infection Resolved - Continue supportive care, delirium precautions - Mental status appears to be improving drastically ANOx4 this morning; very appropriate questions  Acute renal failure requiring HD Hypophosphatemia, mild - Suspect secondary to ATN after recent diarrheal illness and volume depletion in the setting of profound disease, septic shock, COVID infection - Nephrology following - CRRT clotted off 10/25/20 - HD MWF ongoing as tolerated  Acute pancreatitis, resolving - Continue pain control, supportive care, leukocytosis still markedly elevated - NG tube/feeds removed 10/28/20 - tolerate PO well, passed SLP eval - Rectal tube to be removed 10/31/20 - more formed stool with questionable abdominal fullness this morning on exam  Afib, likely paroxysmal; questionably provoked Systolic cardiomyopathy Echo 1/3 with EF 55 - 60% - In and out of A. fib over the hospitalization  - Heparin drip stopped due to hematochezia 1/16 - Continue PO amio  DVT prophylaxis: SCDs only Heparin drip discontinued 10/29/2020 Code Status: DNR Family Communication: Updated over video conference at length about poor prognosis, active bleed and ongoing management  Status is: Inpatient  Dispo: The patient is from: Home              Anticipated d/c is to: To be determined              Anticipated d/c date is: > 72 hours              Patient currently not medically stable for discharge  Consultants:   PCCM, nephrology, GI, cardiology, neurology, palliative care  Antimicrobials:  Unasyn 1/2 >> 1/4 Cefepime 1/4 >> 1/7 Flagyl 1/6 >> 1/7 Vancomycin 1/8 > 1/11  Meropenem 1/7 >> 1/18 Zyvox 1/12>   1/18  Subjective: No acute issues or events overnight, patient have multiple bowel movements now that rectal tube has been removed -these appear to be somewhat loose. Hemoglobin stable overnight, tolerated dialysis well yesterday otherwise denies nausea vomiting headache fevers chills chest pain or shortness of breath..  Objective: Vitals:   11/01/20 1951 11/02/20 0000 11/02/20 0400 11/02/20 0727  BP:  (!) 98/48 (!) 112/50 (!) 100/51  Pulse:  72 78 80  Resp:  17 20 20   Temp: 97.7 F (36.5 C) 97.7 F (36.5 C) 97.7 F (36.5 C) 98.7 F (37.1 C)  TempSrc: Oral Axillary Axillary Axillary  SpO2:  95% 95% 94%  Weight:   98.2 kg   Height:        Intake/Output Summary (Last 24 hours) at 11/02/2020 0740 Last data filed at 11/01/2020 1909 Gross per 24 hour  Intake 804 ml  Output 1001 ml  Net -197 ml   Filed Weights   11/01/20 1600 11/01/20 1909 11/02/20 0400  Weight: 99.5 kg 98.2 kg 98.2 kg    Examination:  General: Elderly frail gentleman sitting up in bed tolerating p.o. without any difficulty HEENT: Multiple excoriations and scabs to the face, sclerae nonicteric, noninjected.  Extraocular movements intact bilaterally. Neck: Right IJ dialysis access noted, bandage clean dry intact. Lungs: Diminished bilaterally without overt rhonchi, wheeze, or rales. Heart:  Regular rate and rhythm.  Without murmurs, rubs, or gallops. Abdomen:  Soft, nontender minimally distended, fullness with palpation  to lower abdomen bilaterally.  Without guarding or rebound. Extremities: Without cyanosis, clubbing, edema, or obvious deformity. Vascular:  Dorsalis pedis and posterior tibial pulses palpable bilaterally. Skin:  Warm and dry, no erythema, no ulcerations.  Data Reviewed: I have personally reviewed following labs and imaging studies  CBC: Recent Labs  Lab 10/29/20 0313 10/29/20 1433 10/30/20 0308 10/31/20 0232 10/31/20 1235 11/01/20 0326 11/01/20 2100 11/02/20 0422  WBC 57.3*  --   47.3* 49.3*  --  44.9*  --  44.2*  HGB 6.0*   < > 7.4* 6.1* 7.3* 6.7* 7.8* 7.7*  HCT 18.1*   < > 22.7* 18.3* 21.6* 20.6* 23.7* 22.9*  MCV 102.3*  --  95.4 95.3  --  95.4  --  92.7  PLT 82*  --  75* 63*  --  65*  --  52*   < > = values in this interval not displayed.   Basic Metabolic Panel: Recent Labs  Lab 10/29/20 0313 10/29/20 1433 10/30/20 0308 10/30/20 2158 10/31/20 0232 10/31/20 1630 11/01/20 0326 11/02/20 0422  NA 133*   < > 132* 135 135 133* 133* 137  K 4.9   < > 5.4* 4.1 4.3 4.8 4.9 4.2  CL 97*   < > 97* 97* 97* 95* 96* 99  CO2 20*   < > 17* 22 21* 20* 19* 23  GLUCOSE 125*   < > 110* 129* 95 130* 75 93  BUN 95*   < > 122* 67* 72* 89* 101* 69*  CREATININE 5.67*   < > 7.10* 4.50* 4.82* 5.71* 6.40* 4.98*  CALCIUM 7.7*   < > 7.7* 7.2* 7.2* 7.5* 7.7* 7.6*  MG 2.5*  --  2.4  --  2.1  --  2.4 2.2  PHOS 8.5*   < > 11.6* 8.2* 8.9* 10.1* 11.2* 8.4*   < > = values in this interval not displayed.   GFR: Estimated Creatinine Clearance: 11.9 mL/min (A) (by C-G formula based on SCr of 4.98 mg/dL (H)). Liver Function Tests: Recent Labs  Lab 10/30/20 2158 10/31/20 0232 10/31/20 1630 11/01/20 0326 11/02/20 0422  ALBUMIN 1.8* 1.8* 1.9* 1.9* 1.8*   No results for input(s): LIPASE, AMYLASE in the last 168 hours. No results for input(s): AMMONIA in the last 168 hours. Coagulation Profile: No results for input(s): INR, PROTIME in the last 168 hours. Cardiac Enzymes: No results for input(s): CKTOTAL, CKMB, CKMBINDEX, TROPONINI in the last 168 hours. BNP (last 3 results) No results for input(s): PROBNP in the last 8760 hours. HbA1C: No results for input(s): HGBA1C in the last 72 hours. CBG: Recent Labs  Lab 11/01/20 1201 11/01/20 1950 11/01/20 2319 11/02/20 0443 11/02/20 0731  GLUCAP 96 114* 109* 99 93   Lipid Profile: No results for input(s): CHOL, HDL, LDLCALC, TRIG, CHOLHDL, LDLDIRECT in the last 72 hours. Thyroid Function Tests: No results for input(s): TSH,  T4TOTAL, FREET4, T3FREE, THYROIDAB in the last 72 hours. Anemia Panel: No results for input(s): VITAMINB12, FOLATE, FERRITIN, TIBC, IRON, RETICCTPCT in the last 72 hours. Sepsis Labs: No results for input(s): PROCALCITON, LATICACIDVEN in the last 168 hours.  No results found for this or any previous visit (from the past 240 hour(s)).  Radiology Studies: No results found.  Scheduled Meds: . sodium chloride   Intravenous Once  . acetaminophen (TYLENOL) oral liquid 160 mg/5 mL  650 mg Per Tube Q6H  . amiodarone  400 mg Per Tube BID  . B-complex with vitamin C  1 tablet Per Tube Daily  .  Chlorhexidine Gluconate Cloth  6 each Topical Q1200  . feeding supplement (NEPRO CARB STEADY)  237 mL Oral TID BM  . Gerhardt's butt cream   Topical TID  . insulin aspart  3-9 Units Subcutaneous Q4H  . lidocaine  1 patch Transdermal Q24H  . mouth rinse  15 mL Mouth Rinse BID  . phenol  1 spray Mouth/Throat Once  . predniSONE  40 mg Per Tube Q breakfast  . sodium chloride flush  10-40 mL Intracatheter Q12H  . sodium chloride flush  3 mL Intravenous Q12H  . triamcinolone 0.1 % cream : eucerin   Topical BID   Continuous Infusions: . sodium chloride Stopped (10/27/20 0931)     LOS: 19 days   Time spent: 75min  Raymond Grantham C Laiylah Roettger, DO [facility]  If 7PM-7AM, please contact night-coverage [email or web address]  11/02/2020, 7:40 AM

## 2020-11-02 NOTE — Progress Notes (Signed)
[facility] Progress Note     Assessment/ Plan:   1. Dialysis dependent AKI 2/2 ATN 1. CRRT 1/4-1/12. And now tol iHD on MWF COVID shift 2. Patient/family desire ongoing dialysis after discussion on 1/20 3. Will ask IR to place tunneled dialysis catheter 4. Will begin clip process as AKI 5. Continue MWF schedule: 2K, 3h, TDC, 1-2L UF, no Heparin   2. Acute pancreatitis: Noted on CT scan 10/16/20 and again on 1/7. 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 Mgmt per primary service  3. Acute hypoxemic RF: likely aspiration PNA +/- COVID pneumonitis, stable o2 req. Steroids.  Resolved  4. Shock: high WBC count, w/u per primary team. Possible ilacus abscess on CT from 1/7. Off levophed 1/11. Mero, zyvox.  Slowly downtrending  5. COVID-19 infection: vaccinated and boosted- treatment per primary  6. Acute encephalopathy: multifactorial, likely COVID + acute illness. CTM  8.  Chronic systolic CHF: EF 04/15/70 on TTE 60-65% with no WMA.  9.  Afib: hep and amiodarone po  10. Acute anemia - has received multiple transfusions with hematochezia as well, per primary service, no heparin on hd; hx/o melanoma is remote, family will reach out to patient's oncologist and try to connect with me to make sure that ESA therapy is prudent.   Subjective:   No co feel ok today Received dialysis yesterday, 1 L ultrafiltration Joined in family conference video chat with patient in the room and discussed current renal status.  Family and patient are very clear that they wish to receive full scope of therapies with goal of returning home, this will include ongoing hemodialysis Discussed that he will require, possibly, outpatient dialysis if GFR does not recover No urine output documented yesterday Interval changes in BUN and creatinine suggest very little GFR at this point   Objective:   BP (!) 109/51 (BP Location:  Right Arm)   Pulse 82   Temp 97.6 F (36.4 C) (Axillary)   Resp 18   Ht 5\' 8"  (1.727 m)   Wt 98.2 kg   SpO2 93%   BMI 32.92 kg/m   Intake/Output Summary (Last 24 hours) at 11/02/2020 1317 Last data filed at 11/01/2020 1909 Gross per 24 hour  Intake 564 ml  Output 1000 ml  Net -436 ml   Weight change:   Physical Exam: GEN: NAD, NCAT HEENT: No conjunctival pallor, EOMI NECK: Supple, no thyromegaly LUNGS: CTA B/L  rhonchi present CV: RRR, No M/R/G ABD: SNDNT +BS, brown tool in rectal tube  EXT: No lower extremity edema ACCESS: RIJ temp  Imaging: No results found.  Labs: BMET Recent Labs  Lab 10/29/20 1433 10/30/20 0308 10/30/20 2158 10/31/20 0232 10/31/20 1630 11/01/20 0326 11/02/20 0422  NA 132* 132* 135 135 133* 133* 137  K 5.2* 5.4* 4.1 4.3 4.8 4.9 4.2  CL 99 97* 97* 97* 95* 96* 99  CO2 17* 17* 22 21* 20* 19* 23  GLUCOSE 205* 110* 129* 95 130* 75 93  BUN 107* 122* 67* 72* 89* 101* 69*  CREATININE 6.31* 7.10* 4.50* 4.82* 5.71* 6.40* 4.98*  CALCIUM 7.4* 7.7* 7.2* 7.2* 7.5* 7.7* 7.6*  PHOS 9.8* 11.6* 8.2* 8.9* 10.1* 11.2* 8.4*   CBC Recent Labs  Lab 10/30/20 0308 10/31/20 0232 10/31/20 1235 11/01/20 0326 11/01/20 2100 11/02/20 0422  WBC 47.3* 49.3*  --  44.9*  --  44.2*  HGB 7.4* 6.1* 7.3* 6.7* 7.8* 7.7*  HCT 22.7* 18.3*  21.6* 20.6* 23.7* 22.9*  MCV 95.4 95.3  --  95.4  --  92.7  PLT 75* 63*  --  65*  --  52*    Medications:    . sodium chloride   Intravenous Once  . acetaminophen (TYLENOL) oral liquid 160 mg/5 mL  650 mg Per Tube Q6H  . amiodarone  400 mg Per Tube BID  . B-complex with vitamin C  1 tablet Per Tube Daily  . Chlorhexidine Gluconate Cloth  6 each Topical Q1200  . feeding supplement (NEPRO CARB STEADY)  237 mL Oral TID BM  . Gerhardt's butt cream   Topical TID  . insulin aspart  3-9 Units Subcutaneous Q4H  . lidocaine  1 patch Transdermal Q24H  . mouth rinse  15 mL Mouth Rinse BID  . phenol  1 spray Mouth/Throat Once  .  predniSONE  40 mg Per Tube Q breakfast  . sodium chloride flush  10-40 mL Intracatheter Q12H  . sodium chloride flush  3 mL Intravenous Q12H  . triamcinolone 0.1 % cream : eucerin   Topical BID    Rexene Agent, MD  11/02/2020, 1:17 PM

## 2020-11-02 NOTE — Progress Notes (Signed)
Renal Navigator aware of patient's need for outpatient HD referral for AKI treatment, however, we are still evaluating where to place patient at this time as outpatient HD seats are unfortunately extremely limited at this time. Navigator working on referral and will update as information is available.   Alphonzo Cruise, [place] Renal Navigator [phone]

## 2020-11-03 DIAGNOSIS — A419 Sepsis, unspecified organism: Secondary | ICD-10-CM | POA: Diagnosis not present

## 2020-11-03 DIAGNOSIS — R6520 Severe sepsis without septic shock: Secondary | ICD-10-CM | POA: Diagnosis not present

## 2020-11-03 LAB — CBC
HCT: 20.3 % — ABNORMAL LOW (ref 39.0–52.0)
Hemoglobin: 6.5 g/dL — CL (ref 13.0–17.0)
MCH: 30.8 pg (ref 26.0–34.0)
MCHC: 32 g/dL (ref 30.0–36.0)
MCV: 96.2 fL (ref 80.0–100.0)
Platelets: 52 10*3/uL — ABNORMAL LOW (ref 150–400)
RBC: 2.11 MIL/uL — ABNORMAL LOW (ref 4.22–5.81)
RDW: 18.9 % — ABNORMAL HIGH (ref 11.5–15.5)
WBC: 43.5 10*3/uL — ABNORMAL HIGH (ref 4.0–10.5)
nRBC: 0.1 % (ref 0.0–0.2)

## 2020-11-03 LAB — MAGNESIUM: Magnesium: 2.3 mg/dL (ref 1.7–2.4)

## 2020-11-03 LAB — GLUCOSE, CAPILLARY
Glucose-Capillary: 116 mg/dL — ABNORMAL HIGH (ref 70–99)
Glucose-Capillary: 147 mg/dL — ABNORMAL HIGH (ref 70–99)
Glucose-Capillary: 69 mg/dL — ABNORMAL LOW (ref 70–99)
Glucose-Capillary: 76 mg/dL (ref 70–99)
Glucose-Capillary: 82 mg/dL (ref 70–99)
Glucose-Capillary: 92 mg/dL (ref 70–99)
Glucose-Capillary: 97 mg/dL (ref 70–99)

## 2020-11-03 LAB — RENAL FUNCTION PANEL
Albumin: 1.8 g/dL — ABNORMAL LOW (ref 3.5–5.0)
Anion gap: 17 — ABNORMAL HIGH (ref 5–15)
BUN: 95 mg/dL — ABNORMAL HIGH (ref 8–23)
CO2: 20 mmol/L — ABNORMAL LOW (ref 22–32)
Calcium: 7.8 mg/dL — ABNORMAL LOW (ref 8.9–10.3)
Chloride: 100 mmol/L (ref 98–111)
Creatinine, Ser: 6.55 mg/dL — ABNORMAL HIGH (ref 0.61–1.24)
GFR, Estimated: 8 mL/min — ABNORMAL LOW (ref >60)
Glucose, Bld: 89 mg/dL (ref 70–99)
Phosphorus: 10.8 mg/dL — ABNORMAL HIGH (ref 2.5–4.6)
Potassium: 4.6 mmol/L (ref 3.5–5.1)
Sodium: 137 mmol/L (ref 135–145)

## 2020-11-03 LAB — HEMOGLOBIN AND HEMATOCRIT, BLOOD
HCT: 24.4 % — ABNORMAL LOW (ref 39.0–52.0)
Hemoglobin: 8 g/dL — ABNORMAL LOW (ref 13.0–17.0)

## 2020-11-03 LAB — PREPARE RBC (CROSSMATCH)

## 2020-11-03 LAB — HEPATITIS B CORE ANTIBODY, TOTAL: Hep B Core Total Ab: NONREACTIVE

## 2020-11-03 LAB — HEPATITIS B SURFACE ANTIBODY,QUALITATIVE: Hep B S Ab: NONREACTIVE

## 2020-11-03 MED ORDER — SODIUM CHLORIDE 0.9% IV SOLUTION: Freq: Once | INTRAVENOUS | Status: DC

## 2020-11-03 MED ORDER — HEPARIN SODIUM (PORCINE) 1000 UNIT/ML IJ SOLN
INTRAMUSCULAR | Status: AC
  Filled 2020-11-03: qty 3

## 2020-11-03 NOTE — Progress Notes (Signed)
PT Cancellation Note  Patient Details Name: Raymond Hurley. MRN: [id number] DOB: Feb 12, 1931   Cancelled Treatment:    Reason Eval/Treat Not Completed: Medical issues which prohibited therapy.  Has Hgb 6.5 and awaiting transfusion, recheck at another time.     Ramond Dial 11/03/2020, 10:34 AM   Mee Hives, PT MS Acute Rehab Dept. Number: [place] and [place]

## 2020-11-03 NOTE — Progress Notes (Signed)
OT Cancellation Note  Patient Details Name: Raymond Hurley. MRN: [id number] DOB: 09/21/1931   Cancelled Treatment:    Reason Eval/Treat Not Completed: Medical issues which prohibited therapy. Note pt. With Hgb 6.5 and scheduled to receive transfusion during HD later today.  Will check back as pt. Able.    Daiva Huge Lorraine-COTA/L 11/03/2020, 12:05 PM

## 2020-11-03 NOTE — Progress Notes (Signed)
Interventional Radiology Brief Note:  Patient in dialysis this afternoon, unavailable for tunneled HD catheter placement.  Patient may eat. Will reorder NPO status for after MN for planned procedure on Monday.   Brynda Greathouse, MS RD PA-C

## 2020-11-03 NOTE — Progress Notes (Signed)
Chief Complaint: Patient was seen in consultation today for HD catheter  Referring Physician(s): Dr. Joelyn Oms  Supervising Physician: Corrie Mckusick  Patient Status: [facility] - In-pt  History of Present Illness: Raymond Hurley. is a 87 y.o. male with complex admission for pancreatitis, Covid RF with PNA and AKI secondary to ATN requiring dialysis. Has been receiving CRRT via (R)IJ temp cath. Now needs more durable access. IR is asked to place tunneled HD cath. PMHx, meds, labs, imaging, allergies reviewed. Has been NPO today as directed. Daughter on phone video call   Past Medical History:  Diagnosis Date   Atrial fibrillation ([place])    CAD (coronary artery disease) 04/2019   CHF (congestive heart failure) (HCC)    Melanoma ([place])    Vitamin B 12 deficiency     Past Surgical History:  Procedure Laterality Date   CARDIOVERSION N/A 06/02/2019   Procedure: CARDIOVERSION;  Surgeon: Jerline Pain, MD;  Location: [place];  Service: Cardiovascular;  Laterality: N/A;   RIGHT/LEFT HEART CATH AND CORONARY ANGIOGRAPHY N/A 04/27/2019   Procedure: RIGHT/LEFT HEART CATH AND CORONARY ANGIOGRAPHY;  Surgeon: Jolaine Artist, MD;  Location: [place] CV LAB;  Service: Cardiovascular;  Laterality: N/A;   TEE WITHOUT CARDIOVERSION N/A 06/02/2019   Procedure: TRANSESOPHAGEAL ECHOCARDIOGRAM (TEE);  Surgeon: Jerline Pain, MD;  Location: [facility] ENDOSCOPY;  Service: Cardiovascular;  Laterality: N/A;    Allergies: Povidone-iodine, Tape, Bee venom, and Covid-19 mrna vacc (moderna)  Medications:  Current Facility-Administered Medications:    0.9 %  sodium chloride infusion (Manually program via Guardrails IV Fluids), , Intravenous, Once, Shalhoub, Sherryll Burger, MD   0.9 %  sodium chloride infusion (Manually program via Guardrails IV Fluids), , Intravenous, Once, Little Ishikawa, MD   0.9 %  sodium chloride infusion, , Intravenous, PRN, Icard, Bradley L, DO, Stopped at  10/27/20 0931   acetaminophen (TYLENOL) 160 MG/5ML solution 650 mg, 650 mg, Per Tube, Q6H, Little Ishikawa, MD, 650 mg at 11/03/20 3086   amiodarone (PACERONE) tablet 400 mg, 400 mg, Per Tube, BID, Little Ishikawa, MD, 400 mg at 11/03/20 5784   B-complex with vitamin C tablet 1 tablet, 1 tablet, Per Tube, Daily, Little Ishikawa, MD, 1 tablet at 11/02/20 0841   ceFAZolin (ANCEF) IVPB 2g/100 mL premix, 2 g, Intravenous, to XRAY, Turpin, Pamela, PA-C   Chlorhexidine Gluconate Cloth 2 % PADS 6 each, 6 each, Topical, Q1200, Mannam, Praveen, MD, 6 each at 11/03/20 0925   chlorpheniramine-HYDROcodone (TUSSIONEX) 10-8 MG/5ML suspension 5 mL, 5 mL, Per Tube, Q12H PRN, Mannam, Praveen, MD   feeding supplement (NEPRO CARB STEADY) liquid 237 mL, 237 mL, Oral, TID BM, Little Ishikawa, MD, 237 mL at 11/03/20 0923   fentaNYL (SUBLIMAZE) injection 25 mcg, 25 mcg, Intravenous, Q2H PRN, Mannam, Praveen, MD, 25 mcg at 10/26/20 0415   Gerhardt's butt cream, , Topical, TID, Mannam, Praveen, MD, Given at 11/03/20 0923   insulin aspart (novoLOG) injection 3-9 Units, 3-9 Units, Subcutaneous, Q4H, Mannam, Praveen, MD, 3 Units at 11/02/20 1909   lidocaine (LIDODERM) 5 % 1 patch, 1 patch, Transdermal, Q24H, Little Ishikawa, MD, 1 patch at 11/02/20 1911   MEDLINE mouth rinse, 15 mL, Mouth Rinse, BID, Mannam, Praveen, MD, 15 mL at 11/03/20 0923   phenol (CHLORASEPTIC) mouth spray 1 spray, 1 spray, Mouth/Throat, Once, Mannam, Praveen, MD   predniSONE (DELTASONE) tablet 40 mg, 40 mg, Per Tube, Q breakfast, Little Ishikawa, MD, 40 mg at 11/03/20 [phone]  [employer], , Per Tube, PRN, Little Ishikawa, MD   sodium chloride flush (NS) 0.9 % injection 10-40 mL, 10-40 mL, Intracatheter, Q12H, Mannam, Praveen, MD, 10 mL at 11/03/20 0925   sodium chloride flush (NS) 0.9 % injection 10-40 mL, 10-40 mL, Intracatheter, PRN, Mannam, Praveen, MD, 10 mL at 11/02/20 0434   sodium  chloride flush (NS) 0.9 % injection 3 mL, 3 mL, Intravenous, Q12H, Mannam, Praveen, MD, 3 mL at 11/03/20 0925   triamcinolone 0.1 % cream : eucerin cream, 1:1, , Topical, BID, Norval Morton, MD, Given at 11/03/20 5366   History reviewed. No pertinent family history.  Social History   Socioeconomic History   Marital status: Married    Spouse name: Not on file   Number of children: Not on file   Years of education: Not on file   Highest education level: Not on file  Occupational History   Not on file  Tobacco Use   Smoking status: Never Smoker   Smokeless tobacco: Never Used  Vaping Use   Vaping Use: Never used  Substance and Sexual Activity   Alcohol use: Yes    Alcohol/week: 7.0 standard drinks    Types: 7 Glasses of wine per week   Drug use: Never   Sexual activity: Not on file  Other Topics Concern   Not on file  Social History Narrative   Not on file   Social Determinants of Health   Financial Resource Strain: Not on file  Food Insecurity: Not on file  Transportation Needs: Not on file  Physical Activity: Not on file  Stress: Not on file  Social Connections: Not on file     Review of Systems: A 12 point ROS discussed and pertinent positives are indicated in the HPI above.  All other systems are negative.  Review of Systems  Vital Signs: BP (!) 110/56 (BP Location: Right Arm)    Pulse 85    Temp 98.3 F (36.8 C) (Oral)    Resp 19    Ht 5\' 8"  (1.727 m)    Wt 98.2 kg    SpO2 96%    BMI 32.92 kg/m   Physical Exam Constitutional:      Appearance: Normal appearance.  HENT:     Mouth/Throat:     Mouth: Mucous membranes are moist.     Pharynx: Oropharynx is clear.  Cardiovascular:     Rate and Rhythm: Normal rate and regular rhythm.     Heart sounds: Normal heart sounds.  Pulmonary:     Effort: Pulmonary effort is normal. No respiratory distress.     Breath sounds: Normal breath sounds.  Skin:    General: Skin is warm and dry.   Neurological:     General: No focal deficit present.     Mental Status: He is alert and oriented to person, place, and time.  Psychiatric:        Mood and Affect: Mood normal.        Thought Content: Thought content normal.        Judgment: Judgment normal.     Imaging: CT ABDOMEN PELVIS WO CONTRAST  Result Date: 10/20/2020 CLINICAL DATA:  Pancreatitis, respiratory failure. EXAM: CT CHEST, ABDOMEN AND PELVIS WITHOUT CONTRAST TECHNIQUE: Multidetector CT imaging of the chest, abdomen and pelvis was performed following the standard protocol without IV contrast. COMPARISON:  October 16, 2020. FINDINGS: CT CHEST FINDINGS Cardiovascular: No significant vascular findings. Normal heart size. No pericardial effusion. Mediastinum/Nodes: No enlarged mediastinal,  hilar, or axillary lymph nodes. Thyroid gland, trachea, and esophagus demonstrate no significant findings. Lungs/Pleura: Mild to moderate bilateral pleural effusions are noted with adjacent subsegmental atelectasis of the lower lobes. No pneumothorax is noted. Patchy airspace opacities are noted in both upper lobes concerning for multifocal pneumonia. Musculoskeletal: No chest wall mass or suspicious bone lesions identified. CT ABDOMEN PELVIS FINDINGS Hepatobiliary: Large gallstone is noted. No biliary dilatation is noted. The liver is unremarkable. Pancreas: There remains is severe amount of inflammatory changes around the pancreas consistent with severe pancreatitis. No definite defined fluid collection to suggest pseudocyst is seen at this time. Spleen: Normal in size without focal abnormality. Adrenals/Urinary Tract: Adrenal glands appear normal. Left renal cyst is again noted and unchanged. No hydronephrosis or renal obstruction is noted. No renal or ureteral calculi are noted. Urinary bladder is unremarkable. Stomach/Bowel: The stomach appears normal. Feeding tube tip appears to be in proximal duodenum. There is no evidence of bowel obstruction or  inflammation. Sigmoid diverticulosis is noted without inflammation. Vascular/Lymphatic: Aortic atherosclerosis. No enlarged abdominal or pelvic lymph nodes. Reproductive: Prostate is unremarkable. Other: 4.6 x 2.3 cm low density is noted within thickened right iliacus muscle concerning for developing abscess. Musculoskeletal: No acute or significant osseous findings. IMPRESSION: 1. Mild to moderate bilateral pleural effusions are noted with adjacent subsegmental atelectasis of the lower lobes. 2. Patchy airspace opacities are noted in both upper lobes concerning for multifocal pneumonia. 3. Large gallstone is noted. 4. There remains severe amount of inflammatory changes around the pancreas consistent with severe pancreatitis. No definite defined fluid collection to suggest pseudocyst is seen at this time. 5. Sigmoid diverticulosis without inflammation. 6. 4.6 x 2.3 cm low density is noted within thickened right iliacus muscle concerning for developing abscess. Aortic Atherosclerosis (ICD10-I70.0). Electronically Signed   By: Marijo Conception M.D.   On: 10/20/2020 13:55   CT ABDOMEN PELVIS WO CONTRAST  Result Date: 10/16/2020 CLINICAL DATA:  Acute generalized abdominal pain. EXAM: CT ABDOMEN AND PELVIS WITHOUT CONTRAST TECHNIQUE: Multidetector CT imaging of the abdomen and pelvis was performed following the standard protocol without IV contrast. COMPARISON:  None. FINDINGS: Lower chest: Mild bilateral pleural effusions are noted with adjacent subsegmental atelectasis. Hepatobiliary: Solitary gallstone is noted. No biliary dilatation is noted. Mild amount of fluid is noted around the liver. No focal abnormality is noted in the liver. Pancreas: Large amount of inflammatory changes are noted involving the pancreas consistent with severe acute pancreatitis. Fluid is seen extending into both pericolic gutters. No definite pseudocyst formation is seen at this time. Spleen: Mild amount of fluid is noted around the spleen.  Adrenals/Urinary Tract: Adrenal glands appear normal. Left renal cyst is noted. No hydronephrosis or renal obstruction is noted. No renal or ureteral calculi are noted. Urinary bladder is decompressed secondary to Foley catheter. Stomach/Bowel: Stomach appears normal. There is no evidence of bowel obstruction. Sigmoid diverticulosis is noted without evidence of diverticulitis. Vascular/Lymphatic: Aortic atherosclerosis. No enlarged abdominal or pelvic lymph nodes. Reproductive: Prostate is unremarkable. Other: No abdominal wall hernia or abnormality. No abdominopelvic ascites. Musculoskeletal: No acute or significant osseous findings. IMPRESSION: 1. Large amount of inflammatory changes are noted involving the pancreas consistent with severe acute pancreatitis. Fluid is seen extending into both pericolic gutters as well as around the liver and spleen. No definite pseudocyst formation is seen at this time. 2. Solitary gallstone is noted. 3. Sigmoid diverticulosis without evidence of diverticulitis. 4. Mild bilateral pleural effusions are noted with adjacent subsegmental atelectasis. 5. Aortic atherosclerosis.  Aortic Atherosclerosis (ICD10-I70.0). Electronically Signed   By: Marijo Conception M.D.   On: 10/16/2020 13:28   DG Abd 1 View  Result Date: 10/25/2020 CLINICAL DATA:  Pneumonia due to COVID-19 EXAM: ABDOMEN - 1 VIEW COMPARISON:  Abdominal radiograph 10/22/2020, chest radiograph 10/25/2020 FINDINGS: Transesophageal feeding tube tip terminates near the gastric antrum/duodenal bulb. Positioning is quite similar to comparison exam. Telemetry leads overlie the lower chest. Opacities in the lung bases, better detailed on chest radiography. Slight paucity of abdominal bowel gas is nonspecific but without features of high-grade bowel obstruction. No suspicious calcifications in the included portions of the abdomen and pelvis. Few phleboliths stent noted in the low pelvis. Degenerative changes in the spine, hips and  pelvis with mild dextrocurvature of the lumbar levels. IMPRESSION: 1. Transesophageal feeding tube tip terminates near the gastric antrum/duodenal bulb. Positioning is similar to comparison exam. Electronically Signed   By: Lovena Le M.D.   On: 10/25/2020 05:51   DG Abd 1 View  Result Date: 10/22/2020 CLINICAL DATA:  Abdominal distension. COVID-19 viral infection. Sepsis. EXAM: ABDOMEN - 1 VIEW COMPARISON:  10/18/2020 FINDINGS: Feeding tube remains in place with the distal tip overlying the expected region of the duodenal bulb. Mild dilatation of several small bowel loops is noted in the left lower quadrant. Mild gaseous distention of colon also noted. These findings are suspicious for mild ileus. IMPRESSION: Feeding tube tip overlies the expected region of the duodenal bulb. Findings suspicious for mild ileus. Electronically Signed   By: Marlaine Hind M.D.   On: 10/22/2020 08:53   DG Abd 1 View  Result Date: 10/15/2020 CLINICAL DATA:  Constipation. EXAM: ABDOMEN - 1 VIEW COMPARISON:  10/14/2020 FINDINGS: Mild gaseous distention of the colon is likely consistent with a component of ileus. No significant retained stool. No small bowel dilatation. IMPRESSION: Probable mild colonic ileus. Electronically Signed   By: Aletta Edouard M.D.   On: 10/15/2020 09:37   DG Abd 1 View  Result Date: 10/14/2020 CLINICAL DATA:  Diarrhea EXAM: ABDOMEN - 1 VIEW COMPARISON:  None. FINDINGS: The bowel gas pattern is normal. No radio-opaque calculi or other significant radiographic abnormality are seen. IMPRESSION: Negative. Electronically Signed   By: Rolm Baptise M.D.   On: 10/14/2020 13:02   CT CHEST WO CONTRAST  Result Date: 10/20/2020 CLINICAL DATA:  Pancreatitis, respiratory failure. EXAM: CT CHEST, ABDOMEN AND PELVIS WITHOUT CONTRAST TECHNIQUE: Multidetector CT imaging of the chest, abdomen and pelvis was performed following the standard protocol without IV contrast. COMPARISON:  October 16, 2020. FINDINGS: CT CHEST  FINDINGS Cardiovascular: No significant vascular findings. Normal heart size. No pericardial effusion. Mediastinum/Nodes: No enlarged mediastinal, hilar, or axillary lymph nodes. Thyroid gland, trachea, and esophagus demonstrate no significant findings. Lungs/Pleura: Mild to moderate bilateral pleural effusions are noted with adjacent subsegmental atelectasis of the lower lobes. No pneumothorax is noted. Patchy airspace opacities are noted in both upper lobes concerning for multifocal pneumonia. Musculoskeletal: No chest wall mass or suspicious bone lesions identified. CT ABDOMEN PELVIS FINDINGS Hepatobiliary: Large gallstone is noted. No biliary dilatation is noted. The liver is unremarkable. Pancreas: There remains is severe amount of inflammatory changes around the pancreas consistent with severe pancreatitis. No definite defined fluid collection to suggest pseudocyst is seen at this time. Spleen: Normal in size without focal abnormality. Adrenals/Urinary Tract: Adrenal glands appear normal. Left renal cyst is again noted and unchanged. No hydronephrosis or renal obstruction is noted. No renal or ureteral calculi are noted. Urinary bladder is unremarkable.  Stomach/Bowel: The stomach appears normal. Feeding tube tip appears to be in proximal duodenum. There is no evidence of bowel obstruction or inflammation. Sigmoid diverticulosis is noted without inflammation. Vascular/Lymphatic: Aortic atherosclerosis. No enlarged abdominal or pelvic lymph nodes. Reproductive: Prostate is unremarkable. Other: 4.6 x 2.3 cm low density is noted within thickened right iliacus muscle concerning for developing abscess. Musculoskeletal: No acute or significant osseous findings. IMPRESSION: 1. Mild to moderate bilateral pleural effusions are noted with adjacent subsegmental atelectasis of the lower lobes. 2. Patchy airspace opacities are noted in both upper lobes concerning for multifocal pneumonia. 3. Large gallstone is noted. 4.  There remains severe amount of inflammatory changes around the pancreas consistent with severe pancreatitis. No definite defined fluid collection to suggest pseudocyst is seen at this time. 5. Sigmoid diverticulosis without inflammation. 6. 4.6 x 2.3 cm low density is noted within thickened right iliacus muscle concerning for developing abscess. Aortic Atherosclerosis (ICD10-I70.0). Electronically Signed   By: Marijo Conception M.D.   On: 10/20/2020 13:55   MR ANGIO HEAD WO CONTRAST  Result Date: 10/14/2020 CLINICAL DATA:  TIA. EXAM: MRI HEAD WITHOUT CONTRAST MRA HEAD WITHOUT CONTRAST TECHNIQUE: Multiplanar, multiecho pulse sequences of the brain and surrounding structures were obtained without intravenous contrast. Angiographic images of the head were obtained using MRA technique without contrast. COMPARISON:  CT head 10/14/2020 FINDINGS: MRI HEAD FINDINGS Brain: Negative for acute infarct. Small area of T2 shine through in the left frontal white matter. No areas of restricted diffusion. Mild white matter changes with scattered small periventricular deep white matter hyperintensities bilaterally. Brainstem normal. Negative for hemorrhage or mass. Negative for hydrocephalus. Cerebral volume normal. Vascular: Normal arterial flow voids Skull and upper cervical spine: No focal skeletal abnormality. Sinuses/Orbits: Mild mucosal edema paranasal sinuses and right mastoid sinus. Normal orbit Other: None MRA HEAD FINDINGS Both vertebral arteries are widely patent and normal. Basilar widely patent. AICA, PICA widely patent. Superior cerebellar and posterior cerebral arteries normal bilaterally. Internal carotid artery widely patent bilaterally. Anterior and middle cerebral arteries normal bilaterally. Negative for cerebral aneurysm. IMPRESSION: 1. Negative for acute infarct. Mild chronic microvascular ischemic change in the white matter. 2. Negative MRA head Electronically Signed   By: Franchot Gallo M.D.   On: 10/14/2020  16:24   MR BRAIN WO CONTRAST  Result Date: 10/14/2020 CLINICAL DATA:  TIA. EXAM: MRI HEAD WITHOUT CONTRAST MRA HEAD WITHOUT CONTRAST TECHNIQUE: Multiplanar, multiecho pulse sequences of the brain and surrounding structures were obtained without intravenous contrast. Angiographic images of the head were obtained using MRA technique without contrast. COMPARISON:  CT head 10/14/2020 FINDINGS: MRI HEAD FINDINGS Brain: Negative for acute infarct. Small area of T2 shine through in the left frontal white matter. No areas of restricted diffusion. Mild white matter changes with scattered small periventricular deep white matter hyperintensities bilaterally. Brainstem normal. Negative for hemorrhage or mass. Negative for hydrocephalus. Cerebral volume normal. Vascular: Normal arterial flow voids Skull and upper cervical spine: No focal skeletal abnormality. Sinuses/Orbits: Mild mucosal edema paranasal sinuses and right mastoid sinus. Normal orbit Other: None MRA HEAD FINDINGS Both vertebral arteries are widely patent and normal. Basilar widely patent. AICA, PICA widely patent. Superior cerebellar and posterior cerebral arteries normal bilaterally. Internal carotid artery widely patent bilaterally. Anterior and middle cerebral arteries normal bilaterally. Negative for cerebral aneurysm. IMPRESSION: 1. Negative for acute infarct. Mild chronic microvascular ischemic change in the white matter. 2. Negative MRA head Electronically Signed   By: Franchot Gallo M.D.   On: 10/14/2020  16:24   US RENAL  Result Date: 10/15/2020 CLINICAL DATA:  Acute kidney injury EXAM: RENAL / URINARY TRACT ULTRASOUND COMPLETE COMPARISON:  Radiograph 10/15/2020 FINDINGS: Right Kidney: Renal measurements: 12.2 x 5.6 x 5 cm = volume: 180.6 mL. Echogenicity within normal limits. No mass or hydronephrosis visualized. Left Kidney: Renal measurements: 11.9 x 6.2 x 4.2 cm = volume: 162.9 mL. Echogenicity within normal limits. No hydronephrosis. Exophytic  cyst off the upper pole measuring 3 cm. Probable small parapelvic cyst within the mid to upper pole measuring 2 cm. Bladder: Decompressed which limits evaluation Other: Small free fluid in the right upper quadrant. Small pleural effusion. IMPRESSION: 1. No hydronephrosis. 2. Cysts within the left kidney 3. Small amount of free fluid in the abdomen. Right pleural effusion. Electronically Signed   By: Donavan Foil M.D.   On: 10/15/2020 18:47   DG CHEST PORT 1 VIEW  Result Date: 10/25/2020 CLINICAL DATA:  COVID.  Pneumonia. EXAM: PORTABLE CHEST 1 VIEW COMPARISON:  10/22/2020.  CT 10/20/2020. FINDINGS: Feeding tube and dual-lumen right IJ catheter stable position. Mediastinum stable. Stable cardiomegaly. Bilateral interstitial infiltrates again noted without significant interim change. No pleural effusion or pneumothorax. Stable biapical pleural thickening most consistent scarring. Degenerative change thoracic spine. IMPRESSION: 1. Feeding tube and dual-lumen right IJ catheter in stable position. 2. Stable cardiomegaly. 3. Bilateral interstitial infiltrates again noted without significant interim change. Electronically Signed   By: Marcello Moores  Register   On: 10/25/2020 05:50   DG CHEST PORT 1 VIEW  Result Date: 10/22/2020 CLINICAL DATA:  Abdominal distension EXAM: PORTABLE CHEST 1 VIEW COMPARISON:  Five days ago FINDINGS: Feeding tube that at least reaches the stomach. Right IJ line in stable position. History of COVID with stable bilateral opacity. Stable heart size and mediastinal contours with upper vascular pedicle widening from vessels based on recent CT. No visible effusion or pneumothorax. IMPRESSION: Stable multifocal pneumonia. Electronically Signed   By: Monte Fantasia M.D.   On: 10/22/2020 08:53   DG CHEST PORT 1 VIEW  Result Date: 10/17/2020 CLINICAL DATA:  Central line placement EXAM: PORTABLE CHEST 1 VIEW COMPARISON:  Earlier today FINDINGS: Right IJ line with tip at the SVC. Prominent upper  mediastinal width with biapical density that is stable; no suspected line related hematoma. There is hazy bilateral chest opacity in the setting of COVID infection. Stable heart size. Negative for pneumothorax. IMPRESSION: New central line with tip at the SVC.  Negative for pneumothorax. Electronically Signed   By: Monte Fantasia M.D.   On: 10/17/2020 10:42   DG Chest Port 1 View  Result Date: 10/17/2020 CLINICAL DATA:  Respiratory failure.  COVID. EXAM: PORTABLE CHEST 1 VIEW COMPARISON:  10/16/2020. FINDINGS: Heart size stable. Low lung volumes with persistent bibasilar infiltrates/edema. Small left pleural effusion most likely present. No pneumothorax. Degenerative change thoracic spine. IMPRESSION: Low lung volumes with persistent bibasilar infiltrates/edema. Small left pleural effusion most likely present. Similar findings noted on prior exam. Electronically Signed   By: Marcello Moores  Register   On: 10/17/2020 07:23   DG Chest Port 1 View  Result Date: 10/16/2020 CLINICAL DATA:  Shortness of breath, COVID EXAM: PORTABLE CHEST 1 VIEW COMPARISON:  10/15/2020 FINDINGS: No significant change in AP portable chest radiograph, likely with small bilateral layering pleural effusions. No new or focal airspace opacity. Heart and mediastinum are IMPRESSION: No significant change in AP portable chest radiograph, likely with small bilateral layering pleural effusions. No new or focal airspace opacity. Electronically Signed   By: Cristie Hem  Laqueta Carina M.D.   On: 10/16/2020 09:00   DG Chest Port 1 View  Result Date: 10/15/2020 CLINICAL DATA:  COVID positive, dyspnea EXAM: PORTABLE CHEST 1 VIEW COMPARISON:  Chest radiograph from one day prior. FINDINGS: Stable cardiomediastinal silhouette with top-normal heart size. No pneumothorax. Small left pleural effusion, stable. No right pleural effusion. No pulmonary edema. Left retrocardiac opacity with increased volume loss. IMPRESSION: 1. Stable small left pleural effusion. 2. Left  retrocardiac opacity with increased volume loss, indicating left lower lobe atelectasis with possible component of pneumonia. Electronically Signed   By: Ilona Sorrel M.D.   On: 10/15/2020 07:53   DG Chest Port 1 View  Result Date: 10/14/2020 CLINICAL DATA:  COVID positive EXAM: PORTABLE CHEST 1 VIEW COMPARISON:  06/30/2017 chest radiograph. FINDINGS: Stable cardiomediastinal silhouette with normal heart size. No pneumothorax. No pleural effusion. No overt pulmonary edema. Patchy left lung base opacity. IMPRESSION: Patchy left lung base opacity, suggesting aspiration or pneumonia. Electronically Signed   By: Ilona Sorrel M.D.   On: 10/14/2020 07:39   DG Abd Portable 1V  Result Date: 10/18/2020 CLINICAL DATA:  Feeding tube placement. EXAM: PORTABLE ABDOMEN - 1 VIEW COMPARISON:  October 15, 2020. FINDINGS: The bowel gas pattern is normal. Distal tip of feeding tube is seen in expected position of distal stomach. No radio-opaque calculi or other significant radiographic abnormality are seen. IMPRESSION: Distal tip of feeding tube seen in expected position of distal stomach. Electronically Signed   By: Marijo Conception M.D.   On: 10/18/2020 10:46   CT HEAD CODE STROKE WO CONTRAST  Result Date: 10/14/2020 CLINICAL DATA:  Code stroke.  Suspected TIA EXAM: CT HEAD WITHOUT CONTRAST TECHNIQUE: Contiguous axial images were obtained from the base of the skull through the vertex without intravenous contrast. COMPARISON:  None. FINDINGS: Brain: There is no mass, hemorrhage or extra-axial collection. The size and configuration of the ventricles and extra-axial CSF spaces are normal. The brain parenchyma is normal, without evidence of acute or chronic infarction. Vascular: No abnormal hyperdensity of the major intracranial arteries or dural venous sinuses. No intracranial atherosclerosis. Skull: The visualized skull base, calvarium and extracranial soft tissues are normal. Sinuses/Orbits: No fluid levels or advanced mucosal  thickening of the visualized paranasal sinuses. No mastoid or middle ear effusion. The orbits are normal. ASPECTS [facility] Stroke Program Early CT Score) - Ganglionic level infarction (caudate, lentiform nuclei, internal capsule, insula, M1-M3 cortex): 7 - Supraganglionic infarction (M4-M6 cortex): 3 Total score (0-10 with 10 being normal): 10 IMPRESSION: 1. Normal head CT. 2. ASPECTS is 10. 3. These results were communicated to Dr. Donnetta Simpers at 1:45 am on 10/14/2020 by telephone. Electronically Signed   By: Ulyses Jarred M.D.   On: 10/14/2020 01:46   VAS US CAROTID  Result Date: 10/15/2020 Carotid Arterial Duplex Study Indications:       Speech disturbance and Confusion, facial droop. Other Factors:     Covid-19, atrial fibrillation, CHF,. Limitations        Today's exam was limited due to Rapid atrial fibrillation and                    rapid, heavy breathing. Comparison Study:  No prior study Performing Technologist: Sharion Dove RVS  Examination Guidelines: A complete evaluation includes B-mode imaging, spectral Doppler, color Doppler, and power Doppler as needed of all accessible portions of each vessel. Bilateral testing is considered an integral part of a complete examination. Limited examinations for reoccurring indications may be performed  as noted.  Right Carotid Findings: +----------+--------+--------+--------+------------------+------------------+             PSV cm/s EDV cm/s Stenosis Plaque Description Comments            +----------+--------+--------+--------+------------------+------------------+  CCA Prox   104      38                                   intimal thickening  +----------+--------+--------+--------+------------------+------------------+  CCA Distal 88       22                                   intimal thickening  +----------+--------+--------+--------+------------------+------------------+  ICA Prox   72       26                                                        +----------+--------+--------+--------+------------------+------------------+  ICA Distal 93       35                                                       +----------+--------+--------+--------+------------------+------------------+  ECA        67       14                                                       +----------+--------+--------+--------+------------------+------------------+ +----------+--------+-------+--------+-------------------+             PSV cm/s EDV cms Describe Arm Pressure (mmHG)  +----------+--------+-------+--------+-------------------+  Subclavian 171                                            +----------+--------+-------+--------+-------------------+ +---------+--------+--+--------+--+  Vertebral PSV cm/s 77 EDV cm/s 19  +---------+--------+--+--------+--+  Left Carotid Findings: +----------+--------+--------+--------+------------------+------------------+             PSV cm/s EDV cm/s Stenosis Plaque Description Comments            +----------+--------+--------+--------+------------------+------------------+  CCA Prox   132      48                                   intimal thickening  +----------+--------+--------+--------+------------------+------------------+  CCA Distal 175      43                                   intimal thickening  +----------+--------+--------+--------+------------------+------------------+  ICA Prox   119      33                                                       +----------+--------+--------+--------+------------------+------------------+  ICA Distal 109      42                                                       +----------+--------+--------+--------+------------------+------------------+  ECA        108      10                                                       +----------+--------+--------+--------+------------------+------------------+ +----------+--------+--------+--------+-------------------+             PSV cm/s EDV cm/s Describe Arm Pressure  (mmHG)  +----------+--------+--------+--------+-------------------+  Subclavian 212                                             +----------+--------+--------+--------+-------------------+ +---------+--------+---+--------+--+  Vertebral PSV cm/s 101 EDV cm/s 30  +---------+--------+---+--------+--+   Summary: Right Carotid: The extracranial vessels were near-normal with only minimal wall                thickening or plaque. Left Carotid: The extracranial vessels were near-normal with only minimal wall               thickening or plaque. Vertebrals:  Bilateral vertebral arteries demonstrate antegrade flow. Subclavians: Normal flow hemodynamics were seen in bilateral subclavian              arteries. *See table(s) above for measurements and observations.  Electronically signed by Antony Contras MD on 10/15/2020 at 2:20:13 PM.    Final    ECHOCARDIOGRAM LIMITED  Result Date: 10/16/2020    ECHOCARDIOGRAM LIMITED REPORT   Patient Name:   Lean Fayson. Date of Exam: 10/16/2020 Medical Rec #:  [id number]             Height:       68.0 in Accession #:    [id number]            Weight:       209.7 lb Date of Birth:  [birth year]             BSA:          2.085 m Patient Age:    58 years              BP:           111/85 mmHg Patient Gender: M                     HR:           97 bpm. Exam Location:  Inpatient Procedure: Limited Echo, Cardiac Doppler and Color Doppler Indications:    TIA  History:        Patient has prior history of Echocardiogram examinations, most                 recent 09/07/2019. CHF, TIA, Arrythmias:Atrial Fibrillation;                 Risk Factors:Hypertension. COVID+.  Sonographer:    Dustin Flock Referring Phys: [place]  1. Left ventricular ejection fraction, by estimation, is 55 to 60%. The left ventricle has normal function. The left ventricle has no regional wall motion abnormalities. Left ventricular diastolic function could not be evaluated.  2. Right ventricular  systolic function is normal. The right ventricular size is normal. There is normal pulmonary artery systolic pressure. The estimated right ventricular systolic pressure is 95.0 mmHg.  3. Left atrial size was mildly dilated.  4. Right atrial size was mildly dilated.  5. The mitral valve is normal in structure. No evidence of mitral valve regurgitation. No evidence of mitral stenosis.  6. The aortic valve is normal in structure. Aortic valve regurgitation is not visualized. No aortic stenosis is present.  7. The inferior vena cava is normal in size with greater than 50% respiratory variability, suggesting right atrial pressure of 3 mmHg. FINDINGS  Left Ventricle: Left ventricular ejection fraction, by estimation, is 55 to 60%. The left ventricle has normal function. The left ventricle has no regional wall motion abnormalities. The left ventricular internal cavity size was normal in size. There is  no left ventricular hypertrophy. Left ventricular diastolic function could not be evaluated. Left ventricular diastolic function could not be evaluated due to atrial fibrillation. Right Ventricle: The right ventricular size is normal. No increase in right ventricular wall thickness. Right ventricular systolic function is normal. There is normal pulmonary artery systolic pressure. The tricuspid regurgitant velocity is 2.76 m/s, and  with an assumed right atrial pressure of 3 mmHg, the estimated right ventricular systolic pressure is 93.2 mmHg. Left Atrium: Left atrial size was mildly dilated. Right Atrium: Right atrial size was mildly dilated. Pericardium: There is no evidence of pericardial effusion. Mitral Valve: The mitral valve is normal in structure. No evidence of mitral valve stenosis. Tricuspid Valve: The tricuspid valve is normal in structure. Tricuspid valve regurgitation is not demonstrated. No evidence of tricuspid stenosis. Aortic Valve: The aortic valve is normal in structure. Aortic valve regurgitation is not  visualized. No aortic stenosis is present. Pulmonic Valve: The pulmonic valve was normal in structure. Pulmonic valve regurgitation is not visualized. No evidence of pulmonic stenosis. Aorta: The aortic root is normal in size and structure. Venous: The inferior vena cava is normal in size with greater than 50% respiratory variability, suggesting right atrial pressure of 3 mmHg. IAS/Shunts: No atrial level shunt detected by color flow Doppler. LEFT VENTRICLE PLAX 2D LVIDd:         5.40 cm Diastology LVIDs:         3.60 cm LV e' medial:    9.12 cm/s LV PW:         1.10 cm LV E/e' medial:  4.1 LV IVS:        1.00 cm LV e' lateral:   8.49 cm/s                        LV E/e' lateral: 4.4  RIGHT VENTRICLE RV S prime:     6.20 cm/s LEFT ATRIUM         Index LA diam:    3.20 cm 1.53 cm/m  MITRAL VALVE               TRICUSPID VALVE MV Area (PHT): 3.42 cm    TR Peak grad:   30.5 mmHg MV Decel Time: 222 msec    TR Vmax:        276.00 cm/s MV E velocity: 37.43 cm/s MV A velocity: 68.90 cm/s MV  E/A ratio:  0.54 Mihai Croitoru MD Electronically signed by Sanda Klein MD Signature Date/Time: 10/16/2020/4:41:44 PM    Final    US Abdomen Limited RUQ (LIVER/GB)  Result Date: 10/21/2020 CLINICAL DATA:  Acute pancreatitis. EXAM: ULTRASOUND ABDOMEN LIMITED RIGHT UPPER QUADRANT COMPARISON:  None. FINDINGS: Gallbladder: A 1.4 cm gallstone is seen. There is no evidence of gallbladder dilatation or wall thickening. No evidence of pericholecystic fluid. No sonographic Murphy sign noted by sonographer. Common bile duct: Diameter: 2 mm, within normal limits. Liver: No focal lesion identified. Within normal limits in parenchymal echogenicity. Portal vein is patent on color Doppler imaging with normal direction of blood flow towards the liver. Other: Right pleural effusion incidentally noted. IMPRESSION: Cholelithiasis. No sonographic signs of cholecystitis or biliary ductal dilatation. Right pleural effusion incidentally noted.  Electronically Signed   By: Marlaine Hind M.D.   On: 10/21/2020 15:26    Labs:  CBC: Recent Labs    10/31/20 0232 10/31/20 1235 11/01/20 0326 11/01/20 2100 11/02/20 0422 11/03/20 0336  WBC 49.3*  --  44.9*  --  44.2* 43.5*  HGB 6.1*   < > 6.7* 7.8* 7.7* 6.5*  HCT 18.3*   < > 20.6* 23.7* 22.9* 20.3*  PLT 63*  --  65*  --  52* 52*   < > = values in this interval not displayed.    COAGS: Recent Labs    10/14/20 0123 10/16/20 0949 10/21/20 0328 10/22/20 0234 10/22/20 1411 10/23/20 0232  INR 1.4*  --   --   --   --   --   APTT 40*   < > 73* 126* 124* 75*   < > = values in this interval not displayed.    BMP: Recent Labs    03/03/20 1246 10/14/20 0123 11/01/20 0326 11/02/20 0422 11/02/20 1546 11/03/20 0336  NA 144   < > 133* 137 137 137  K 5.0   < > 4.9 4.2 4.5 4.6  CL 108*   < > 96* 99 100 100  CO2 28   < > 19* 23 20* 20*  GLUCOSE 101*   < > 75 93 180* 89  BUN 14   < > 101* 69* 81* 95*  CALCIUM 8.4*   < > 7.7* 7.6* 7.7* 7.8*  CREATININE 1.19   < > 6.40* 4.98* 5.88* 6.55*  GFRNONAA 55*   < > 8* 11* 9* 8*  GFRAA 64  --   --   --   --   --    < > = values in this interval not displayed.    LIVER FUNCTION TESTS: Recent Labs    10/17/20 0443 10/17/20 1619 10/18/20 0352 10/18/20 1456 10/19/20 0358 10/19/20 1701 10/25/20 0342 10/25/20 1559 11/01/20 0326 11/02/20 0422 11/02/20 1546 11/03/20 0336  BILITOT 1.1  --  1.4*  --  0.9  --  0.7  --   --   --   --   --   AST 50*  --  51*  --  53*  --  47*  --   --   --   --   --   ALT 40  --  42  --  46*  --  53*  --   --   --   --   --   ALKPHOS 58  --  55  --  67  --  225*  --   --   --   --   --   PROT 4.9*  --  5.2*  --  4.9*  --  5.3*  --   --   --   --   --   ALBUMIN 2.3*   < > 2.9*   < > 2.5*   < > 2.5*   < > 1.9* 1.8* 1.8* 1.8*   < > = values in this interval not displayed.    TUMOR MARKERS: No results for input(s): AFPTM, CEA, CA199, CHROMGRNA in the last 8760 hours.  Assessment and Plan: AKI from  ATN now requiring prolonged dialysis Covid infection Plan for tunneled HD catheter Labs reviewed. Risks and benefits of image guided tunneled HD catheter placement was discussed with the patient including, but not limited to bleeding, infection, pneumothorax, or fibrin sheath development and need for additional procedures.  All of the patient's questions were answered, patient is agreeable to proceed. Consent signed and in chart.    Thank you for this interesting consult.  I greatly enjoyed meeting Kale Rondeau. and look forward to participating in their care.  A copy of this report was sent to the requesting provider on this date.  Electronically Signed: Ascencion Dike, PA-C 11/03/2020, 12:08 PM   I spent a total of 20 minutes in face to face in clinical consultation, greater than 50% of which was counseling/coordinating care for HD catheter

## 2020-11-03 NOTE — Progress Notes (Signed)
Spoke with dialysis this morning.  Patient is scheduled between 1 and 2 pm.  Reached out to Dr. Avon Gully to see if he want dialysis team to administer the ordered blood.

## 2020-11-03 NOTE — Progress Notes (Signed)
Patient blood sugar dropped to 69 this morning asymptomatic hypoglycemia protocol followed 4 ounces apple juice rechecked blood sugar up to 76.

## 2020-11-03 NOTE — Progress Notes (Signed)
PROGRESS NOTE    Raymond Hurley.  NOB:[id number] DOB: 1931/01/03 DOA: 10/14/2020 PCP: Lajean Manes, MD   Brief Narrative:  87 year old man who is hypotensive in the context of acute diarrhea on 1/1 was admitted w/ dx of COVID and acute dehydration from acute diarrheal illness. Of note he was fully vaccinated and had received his booster. Given worsening delirium, multiple organ failure, and profound hypotension with CT abdomen showing pancreatitis.  Given advanced age and multiple organ dysfunction in addition to concern about further clinical decline patient was transitioned to critical care team.  1/4 > hypotension and A.fib RVR.  Transferring to ICU. Started CRRT 1/7 > Worsening leukocytosis.  CT abdomen pelvis with severe pancreatitis, possible iliopsoas abscess.  Deemed too unstable for intervention per surgery and interventional radiology 1/8 > started pressors.  Antibiotics broadened to meropenem and vancomycin.  Made DNR 1/12 > stopped CRRT 1/13 > iHD 1/14-15 > passed speech evaluation, tolerating p.o. well, NG tube removed 1/16 > acutely overnight noted to have hematochezia, hemoglobin 6.0, -Heparin drip stopped, hypotensive, concern for worsening clinical deterioration remains high risk for decompensation - 2u PRBC given 1/17 > Hgb improved - plan for HD if BP holds; Palliative to follow along given tenuous status. 1/18 > repeat hemoglobin drop - GI bleed resolved - this may be due to advancing kidney disease; poor prognosticator - family updated over group video chat at bedside 1/19 > Hgb continues to drop - FOBT positive - normal stool appearance 1/20 > Hgb WNL - family/patient want to continue to pursue fairly aggressive medical management to give patient the best chance for discharge back home 1/21 > Hgb down again overnight - 1u PRBC to be given with dialysis  Assessment & Plan:   Principal Problem:   Severe sepsis ([place]) Active Problems:   PAF (paroxysmal atrial  fibrillation) (HCC)   Anticoagulated   Pneumonia due to COVID-19 virus   Chronic systolic CHF (congestive heart failure) (HCC)   Elevated troponin   Facial weakness   Hypocalcemia   Macrocytic anemia   Acute metabolic encephalopathy   Acute respiratory failure (HCC)   AKI (acute kidney injury) ([place])   Dehydration   Persistent atrial fibrillation ([place])  Acute blood loss anemia to ongoing GI bleed requiring multiple transfusions; Concurrent anemia of kidney disease, profound dehydration and acute illness - Hemoglobin <7 again this am - 1u PRBC with dialysis later today - Transfusion 1/12, 1/13, 1/16x2, 1/18, 1/19, 1/21 (7 total since admission) - Stool brown but remains heme positive - will continue to follow - not a candidate for endoscopy currently given stable labs and high risk for procedure.  Acute hypoxic respiratory failure 2/2 aspiration PNA, resolved Cannot rule out concurrent/resolving COVID-19 infection  - On room air at rest - Continue early ambulation, incentive spirometry, flutter - Continue steroids - wean as tolelrated  Septic shock, resolved Possible iliac abscess - Previous team discussed with IR/CCS on 1/7, no surgery or drain indicated - Meropenem/linezolid completed - follow for fever curve/symptoms. Will likely need to involve ID if concern for worsening/re-infection given profound antibiotic use.  Acute metabolic encephalopathy, multifactorial: In the setting of uremia, septic shock, pancreatitis, anemia and COVID-19 infection Resolved - Continue supportive care, delirium precautions - Mental status appears to be improving drastically ANOx4 this morning; very appropriate questions  Acute renal failure requiring HD Hypophosphatemia, mild - Suspect secondary to ATN after recent diarrheal illness and volume depletion in the setting of profound disease, septic shock, COVID  infection - Nephrology following - CRRT clotted off 10/25/20 - HD MWF ongoing as  tolerated  Acute pancreatitis, resolving - Continue pain control, supportive care, leukocytosis still markedly elevated - NG tube/feeds removed 10/28/20 - tolerate PO well, passed SLP eval - Rectal tube to be removed 10/31/20 - more formed stool with questionable abdominal fullness this morning on exam  Afib, likely paroxysmal; questionably provoked Systolic cardiomyopathy Echo 1/3 with EF 55 - 60% - In and out of A. fib over the hospitalization  - Heparin drip stopped due to hematochezia 1/16 - Continue PO amio  Goals of care discussion  - At this point family and patient continued to impress upon the medical staff they would like to continue aggressive med care including hemodialysis, blood transfusions and procedures as indicated - We discussed that at his advanced age and multiple comorbid conditions he would have a difficult road ahead of him - Patient and family have agreed against intubation, CPR and remains DNR  DVT prophylaxis: SCDs only Heparin drip discontinued 10/29/2020 Code Status: DNR Family Communication: Updated previously over video conference with wife, son, daughter-in-law and other family members at length about poor prognosis, active bleed and ongoing management  Status is: Inpatient  Dispo: The patient is from: Home              Anticipated d/c is to: To be determined              Anticipated d/c date is: > 72 hours              Patient currently not medically stable for discharge  Consultants:   PCCM, nephrology, GI, cardiology, neurology, palliative care  Antimicrobials:  Unasyn 1/2 >> 1/4 Cefepime 1/4 >> 1/7 Flagyl 1/6 >> 1/7 Vancomycin 1/8 > 1/11  Meropenem 1/7 >> 1/18 Zyvox 1/12>  1/18  Subjective: Continues to be anemic overnight on morning labs but denies any acute shortness of breath headache fever chills constipation diarrhea nausea vomiting.  Objective: Vitals:   11/02/20 2223 11/03/20 0000 11/03/20 0400 11/03/20 0727  BP: (!) 109/53  (!) 111/52 (!) 109/54 (!) 101/45  Pulse:  65  77  Resp: 20 17 16 20   Temp:  98.4 F (36.9 C) 98 F (36.7 C) 98.1 F (36.7 C)  TempSrc:  Axillary Oral Oral  SpO2:  95% 94% 97%  Weight:      Height:        Intake/Output Summary (Last 24 hours) at 11/03/2020 4332 Last data filed at 11/02/2020 2200 Gross per 24 hour  Intake 480 ml  Output --  Net 480 ml   Filed Weights   11/01/20 1600 11/01/20 1909 11/02/20 0400  Weight: 99.5 kg 98.2 kg 98.2 kg    Examination:  General: Elderly frail gentleman sitting up in bed tolerating p.o. without any difficulty HEENT: Multiple excoriations and scabs to the face, sclerae nonicteric, noninjected.  Extraocular movements intact bilaterally. Neck: Right IJ dialysis access noted, bandage clean dry intact. Lungs: Diminished bilaterally without overt rhonchi, wheeze, or rales. Heart:  Regular rate and rhythm.  Without murmurs, rubs, or gallops. Abdomen:  Soft, nontender moderately distended, fullness with palpation to lower abdomen bilaterally.  Without guarding or rebound. Extremities: Without cyanosis, clubbing, edema, or obvious deformity. Vascular:  Dorsalis pedis and posterior tibial pulses palpable bilaterally. Skin:  Warm and dry, no erythema, no ulcerations.  Data Reviewed: I have personally reviewed following labs and imaging studies  CBC: Recent Labs  Lab 10/30/20 0308 10/31/20 0232 10/31/20 1235  11/01/20 0326 11/01/20 2100 11/02/20 0422 11/03/20 0336  WBC 47.3* 49.3*  --  44.9*  --  44.2* 43.5*  HGB 7.4* 6.1* 7.3* 6.7* 7.8* 7.7* 6.5*  HCT 22.7* 18.3* 21.6* 20.6* 23.7* 22.9* 20.3*  MCV 95.4 95.3  --  95.4  --  92.7 96.2  PLT 75* 63*  --  65*  --  52* 52*   Basic Metabolic Panel: Recent Labs  Lab 10/30/20 0308 10/30/20 2158 10/31/20 0232 10/31/20 1630 11/01/20 0326 11/02/20 0422 11/02/20 1546 11/03/20 0336  NA 132*   < > 135 133* 133* 137 137 137  K 5.4*   < > 4.3 4.8 4.9 4.2 4.5 4.6  CL 97*   < > 97* 95* 96* 99 100  100  CO2 17*   < > 21* 20* 19* 23 20* 20*  GLUCOSE 110*   < > 95 130* 75 93 180* 89  BUN 122*   < > 72* 89* 101* 69* 81* 95*  CREATININE 7.10*   < > 4.82* 5.71* 6.40* 4.98* 5.88* 6.55*  CALCIUM 7.7*   < > 7.2* 7.5* 7.7* 7.6* 7.7* 7.8*  MG 2.4  --  2.1  --  2.4 2.2  --  2.3  PHOS 11.6*   < > 8.9* 10.1* 11.2* 8.4* 9.2* 10.8*   < > = values in this interval not displayed.   GFR: Estimated Creatinine Clearance: 9 mL/min (A) (by C-G formula based on SCr of 6.55 mg/dL (H)). Liver Function Tests: Recent Labs  Lab 10/31/20 1630 11/01/20 0326 11/02/20 0422 11/02/20 1546 11/03/20 0336  ALBUMIN 1.9* 1.9* 1.8* 1.8* 1.8*   No results for input(s): LIPASE, AMYLASE in the last 168 hours. No results for input(s): AMMONIA in the last 168 hours. Coagulation Profile: No results for input(s): INR, PROTIME in the last 168 hours. Cardiac Enzymes: No results for input(s): CKTOTAL, CKMB, CKMBINDEX, TROPONINI in the last 168 hours. BNP (last 3 results) No results for input(s): PROBNP in the last 8760 hours. HbA1C: No results for input(s): HGBA1C in the last 72 hours. CBG: Recent Labs  Lab 11/02/20 2045 11/03/20 0013 11/03/20 0411 11/03/20 0445 11/03/20 0730  GLUCAP 106* 82 69* 76 92   Lipid Profile: No results for input(s): CHOL, HDL, LDLCALC, TRIG, CHOLHDL, LDLDIRECT in the last 72 hours. Thyroid Function Tests: No results for input(s): TSH, T4TOTAL, FREET4, T3FREE, THYROIDAB in the last 72 hours. Anemia Panel: No results for input(s): VITAMINB12, FOLATE, FERRITIN, TIBC, IRON, RETICCTPCT in the last 72 hours. Sepsis Labs: No results for input(s): PROCALCITON, LATICACIDVEN in the last 168 hours.  No results found for this or any previous visit (from the past 240 hour(s)).  Radiology Studies: No results found.  Scheduled Meds: . sodium chloride   Intravenous Once  . sodium chloride   Intravenous Once  . acetaminophen (TYLENOL) oral liquid 160 mg/5 mL  650 mg Per Tube Q6H  .  amiodarone  400 mg Per Tube BID  . B-complex with vitamin C  1 tablet Per Tube Daily  . Chlorhexidine Gluconate Cloth  6 each Topical Q1200  . feeding supplement (NEPRO CARB STEADY)  237 mL Oral TID BM  . Gerhardt's butt cream   Topical TID  . insulin aspart  3-9 Units Subcutaneous Q4H  . lidocaine  1 patch Transdermal Q24H  . mouth rinse  15 mL Mouth Rinse BID  . phenol  1 spray Mouth/Throat Once  . predniSONE  40 mg Per Tube Q breakfast  . sodium chloride flush  10-40 mL Intracatheter Q12H  . sodium chloride flush  3 mL Intravenous Q12H  . triamcinolone 0.1 % cream : eucerin   Topical BID   Continuous Infusions: . sodium chloride Stopped (10/27/20 0931)  .  ceFAZolin (ANCEF) IV       LOS: 20 days   Time spent: 32min  Dejah Droessler C Jamyrah Saur, DO [facility]  If 7PM-7AM, please contact night-coverage [email or web address]  11/03/2020, 8:14 AM

## 2020-11-03 NOTE — Progress Notes (Signed)
Patient hemoglobin 6.5 this morning .text paged Dr. Avon Gully.

## 2020-11-03 NOTE — Progress Notes (Signed)
SLP Cancellation Note  Patient Details Name: Raymond Hurley. MRN: [id number] DOB: July 11, 1931   Cancelled treatment:       Reason Eval/Treat Not Completed: Patient at procedure or test/unavailable (Pt off unit for HD. SLP will follow up on subsequent date.)  Artha Chiasson I. Hardin Negus, [place], [place] Office number [phone] Pager [place] 11/03/2020, 2:38 PM

## 2020-11-03 NOTE — Progress Notes (Signed)
[facility] Progress Note     Assessment/ Plan:   1. Dialysis dependent AKI 2/2 ATN 1. CRRT 1/4-1/12. And now tol iHD on MWF COVID shift 2. Patient/family desire ongoing dialysis after discussion on 1/20 3. IR to place tunneled dialysis catheter 4. Will begin clip process as AKI, SW aware 5. Plan for HD today via HD cath.  2. Acute pancreatitis: Noted on CT scan 10/16/20 and again on 1/7. 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 Mgmt per primary service  3. Acute hypoxemic RF: likely aspiration PNA +/- COVID pneumonitis, stable o2 req. Steroids.  Resolved  4. Shock: high WBC count, w/u per primary team. Possible ilacus abscess on CT from 1/7. Off levophed 1/11. Mero, zyvox.  Slowly downtrending  5. COVID-19 infection: vaccinated and boosted- treatment per primary  6. Acute encephalopathy: multifactorial, likely COVID + acute illness.  8.  Chronic systolic CHF: EF 12/12/52 on TTE 60-65% with no WMA.  9.  Afib: hep and amiodarone po  10. Acute anemia: Received multiple blood transfusion.  Plan for 1 unit of PRBC today.  Check iron studies.  History of metastatic melanoma, may need to talk to oncologist before starting ESA.  Subjective:   No new event.  Denies nausea vomiting chest pain shortness of breath.  Plan for tunneled HD catheter placement and HD today.   Objective:   BP (!) 110/56 (BP Location: Right Arm)   Pulse 85   Temp 98.3 F (36.8 C) (Oral)   Resp 19   Ht 5\' 8"  (1.727 m)   Wt 98.2 kg   SpO2 96%   BMI 32.92 kg/m   Intake/Output Summary (Last 24 hours) at 11/03/2020 1258 Last data filed at 11/02/2020 2200 Gross per 24 hour  Intake 360 ml  Output --  Net 360 ml   Weight change:   Physical Exam: GEN: NAD, NCAT HEENT: No conjunctival pallor, EOMI NECK: Supple, no thyromegaly LUNGS: Clear bilateral, no rhonchi CV: RRR, No M/R/G ABD: SNDNT +BS  EXT: No lower  extremity edema ACCESS: RIJ temp  site clean  Imaging: No results found.  Labs: BMET Recent Labs  Lab 10/30/20 2158 10/31/20 0232 10/31/20 1630 11/01/20 0326 11/02/20 0422 11/02/20 1546 11/03/20 0336  NA 135 135 133* 133* 137 137 137  K 4.1 4.3 4.8 4.9 4.2 4.5 4.6  CL 97* 97* 95* 96* 99 100 100  CO2 22 21* 20* 19* 23 20* 20*  GLUCOSE 129* 95 130* 75 93 180* 89  BUN 67* 72* 89* 101* 69* 81* 95*  CREATININE 4.50* 4.82* 5.71* 6.40* 4.98* 5.88* 6.55*  CALCIUM 7.2* 7.2* 7.5* 7.7* 7.6* 7.7* 7.8*  PHOS 8.2* 8.9* 10.1* 11.2* 8.4* 9.2* 10.8*   CBC Recent Labs  Lab 10/31/20 0232 10/31/20 1235 11/01/20 0326 11/01/20 2100 11/02/20 0422 11/03/20 0336  WBC 49.3*  --  44.9*  --  44.2* 43.5*  HGB 6.1*   < > 6.7* 7.8* 7.7* 6.5*  HCT 18.3*   < > 20.6* 23.7* 22.9* 20.3*  MCV 95.3  --  95.4  --  92.7 96.2  PLT 63*  --  65*  --  52* 52*   < > = values in this interval not displayed.    Medications:    . sodium chloride   Intravenous Once  . sodium chloride   Intravenous Once  . acetaminophen (TYLENOL) oral liquid 160 mg/5 mL  650 mg Per Tube Q6H  .  amiodarone  400 mg Per Tube BID  . B-complex with vitamin C  1 tablet Per Tube Daily  . Chlorhexidine Gluconate Cloth  6 each Topical Q1200  . feeding supplement (NEPRO CARB STEADY)  237 mL Oral TID BM  . Gerhardt's butt cream   Topical TID  . insulin aspart  3-9 Units Subcutaneous Q4H  . lidocaine  1 patch Transdermal Q24H  . mouth rinse  15 mL Mouth Rinse BID  . phenol  1 spray Mouth/Throat Once  . predniSONE  40 mg Per Tube Q breakfast  . sodium chloride flush  10-40 mL Intracatheter Q12H  . sodium chloride flush  3 mL Intravenous Q12H  . triamcinolone 0.1 % cream : eucerin   Topical BID    Lanorris Kalisz Tanna Furry, MD  11/03/2020, 12:58 PM

## 2020-11-03 NOTE — Progress Notes (Signed)
Pt was attempted but gone to HD, will retry at another time.  11/03/20 1500  PT Visit Information  Reason Eval/Treat Not Completed Patient at procedure or test/unavailable    Mee Hives, PT MS Acute Rehab Dept. Number: [place] and [place]

## 2020-11-04 DIAGNOSIS — A419 Sepsis, unspecified organism: Secondary | ICD-10-CM | POA: Diagnosis not present

## 2020-11-04 DIAGNOSIS — R6520 Severe sepsis without septic shock: Secondary | ICD-10-CM | POA: Diagnosis not present

## 2020-11-04 LAB — TYPE AND SCREEN
ABO/RH(D): O POS
Antibody Screen: NEGATIVE
Unit division: 0
Unit division: 0
Unit division: 0

## 2020-11-04 LAB — RENAL FUNCTION PANEL
Albumin: 1.9 g/dL — ABNORMAL LOW (ref 3.5–5.0)
Anion gap: 17 — ABNORMAL HIGH (ref 5–15)
BUN: 65 mg/dL — ABNORMAL HIGH (ref 8–23)
CO2: 22 mmol/L (ref 22–32)
Calcium: 7.8 mg/dL — ABNORMAL LOW (ref 8.9–10.3)
Chloride: 97 mmol/L — ABNORMAL LOW (ref 98–111)
Creatinine, Ser: 4.78 mg/dL — ABNORMAL HIGH (ref 0.61–1.24)
GFR, Estimated: 11 mL/min — ABNORMAL LOW (ref >60)
Glucose, Bld: 93 mg/dL (ref 70–99)
Phosphorus: 8.3 mg/dL — ABNORMAL HIGH (ref 2.5–4.6)
Potassium: 4 mmol/L (ref 3.5–5.1)
Sodium: 136 mmol/L (ref 135–145)

## 2020-11-04 LAB — GLUCOSE, CAPILLARY
Glucose-Capillary: 118 mg/dL — ABNORMAL HIGH (ref 70–99)
Glucose-Capillary: 135 mg/dL — ABNORMAL HIGH (ref 70–99)
Glucose-Capillary: 236 mg/dL — ABNORMAL HIGH (ref 70–99)
Glucose-Capillary: 77 mg/dL (ref 70–99)
Glucose-Capillary: 84 mg/dL (ref 70–99)
Glucose-Capillary: 89 mg/dL (ref 70–99)

## 2020-11-04 LAB — FERRITIN: Ferritin: 1197 ng/mL — ABNORMAL HIGH (ref 24–336)

## 2020-11-04 LAB — CBC
HCT: 22.4 % — ABNORMAL LOW (ref 39.0–52.0)
Hemoglobin: 7.6 g/dL — ABNORMAL LOW (ref 13.0–17.0)
MCH: 30.3 pg (ref 26.0–34.0)
MCHC: 33.9 g/dL (ref 30.0–36.0)
MCV: 89.2 fL (ref 80.0–100.0)
Platelets: 62 10*3/uL — ABNORMAL LOW (ref 150–400)
RBC: 2.51 MIL/uL — ABNORMAL LOW (ref 4.22–5.81)
RDW: 21.2 % — ABNORMAL HIGH (ref 11.5–15.5)
WBC: 46.9 10*3/uL — ABNORMAL HIGH (ref 4.0–10.5)
nRBC: 0 % (ref 0.0–0.2)

## 2020-11-04 LAB — BPAM RBC
Blood Product Expiration Date: 202202172359
Blood Product Expiration Date: 202202182359
Blood Product Expiration Date: 202202212359
ISSUE DATE / TIME: 202201180600
ISSUE DATE / TIME: 202201191705
ISSUE DATE / TIME: 202201211500
Unit Type and Rh: 5100
Unit Type and Rh: 5100
Unit Type and Rh: 5100

## 2020-11-04 LAB — IRON AND TIBC
Iron: 54 ug/dL (ref 45–182)
Saturation Ratios: 24 % (ref 17.9–39.5)
TIBC: 223 ug/dL — ABNORMAL LOW (ref 250–450)
UIBC: 169 ug/dL

## 2020-11-04 LAB — MAGNESIUM: Magnesium: 2 mg/dL (ref 1.7–2.4)

## 2020-11-04 MED ORDER — DARBEPOETIN ALFA 60 MCG/0.3ML IJ SOSY: 60.0000 ug | PREFILLED_SYRINGE | INTRAMUSCULAR | Status: DC

## 2020-11-04 MED ORDER — DARBEPOETIN ALFA 100 MCG/0.5ML IJ SOSY
100.0000 ug | PREFILLED_SYRINGE | Freq: Once | INTRAMUSCULAR | Status: DC
  Filled 2020-11-04: qty 0.5

## 2020-11-04 MED ORDER — SODIUM CHLORIDE 0.9 % IV BOLUS
250.0000 mL | Freq: Once | INTRAVENOUS | Status: AC
  Administered 2020-11-04: 250 mL via INTRAVENOUS

## 2020-11-04 MED ORDER — CEFAZOLIN SODIUM-DEXTROSE 2-4 GM/100ML-% IV SOLN
2.0000 g | INTRAVENOUS | Status: DC
  Filled 2020-11-04: qty 100

## 2020-11-04 MED ORDER — SODIUM CHLORIDE 0.9 % IV SOLN
250.0000 mg | Freq: Every day | INTRAVENOUS | Status: AC
  Administered 2020-11-05: 250 mg via INTRAVENOUS
  Filled 2020-11-04 (×2): qty 20

## 2020-11-04 NOTE — Progress Notes (Signed)
[facility] Progress Note     Assessment/ Plan:   1. Dialysis dependent AKI 2/2 ATN 1. CRRT 1/4-1/12. And now tol iHD on MWF COVID shift 2. Patient/family desire ongoing dialysis after discussion on 1/20 3. IR to place tunneled dialysis catheter, on 1/24 4. Began clip process as AKI, SW aware 5. Status post HD on 1/21 with 1.5 L UF, tolerated well.  Next HD on 1/24.  2. Acute pancreatitis: Noted on CT scan 10/16/20 and again on 1/7. 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 Mgmt per primary service  3. Acute hypoxemic RF: likely aspiration PNA +/- COVID pneumonitis, stable o2 req. Steroids.  Resolved  4. Shock: high WBC count, w/u per primary team. Possible ilacus abscess on CT from 1/7. Off levophed 1/11. Mero, zyvox.    5. COVID-19 infection: vaccinated and boosted- treatment per primary  6. Acute encephalopathy: multifactorial, likely COVID + acute illness.  8.  Chronic systolic CHF: EF 0/3/54 on TTE 60-65% with no WMA.  9.  Afib: hep and amiodarone po  10. Acute anemia: Received multiple blood transfusion.  He has a history of metastatic melanoma.  Today, I discussed with multiple family members via video call in the patient's room.  The family discussed with the patient's oncologist at [facility] who reportedly said no contraindication for erythropoietin to manage anemia, I agree.  Iron saturation 24 with high ferritin level.  I will order IV iron and start ESA.  Monitor hemoglobin.  Subjective:   No new event.  Tolerated dialysis well yesterday.  The IR procedure postponed to Monday.  He was talking with multiple family members by video call.  I discussed with the family members as well.  We discussed about anemia and use of erythropoietin as discussed above.   Objective:   BP (!) 106/50 (BP Location: Right Arm)   Pulse 79   Temp 97.9 F (36.6 C) (Oral)   Resp 18   Ht 5\' 8"  (1.727 m)    Wt 100.4 kg   SpO2 95%   BMI 33.65 kg/m   Intake/Output Summary (Last 24 hours) at 11/04/2020 1135 Last data filed at 11/03/2020 2114 Gross per 24 hour  Intake 80 ml  Output 1526 ml  Net -1446 ml   Weight change:   Physical Exam: GEN: NAD, NCAT HEENT: No conjunctival pallor, EOMI NECK: Supple, no thyromegaly LUNGS: Clear bilateral, no rhonchi CV: RRR, No M/R/G ABD: SNDNT +BS  EXT: No lower extremity edema ACCESS: RIJ temp  site clean  Imaging: No results found.  Labs: BMET Recent Labs  Lab 10/31/20 0232 10/31/20 1630 11/01/20 0326 11/02/20 0422 11/02/20 1546 11/03/20 0336 11/04/20 0423  NA 135 133* 133* 137 137 137 136  K 4.3 4.8 4.9 4.2 4.5 4.6 4.0  CL 97* 95* 96* 99 100 100 97*  CO2 21* 20* 19* 23 20* 20* 22  GLUCOSE 95 130* 75 93 180* 89 93  BUN 72* 89* 101* 69* 81* 95* 65*  CREATININE 4.82* 5.71* 6.40* 4.98* 5.88* 6.55* 4.78*  CALCIUM 7.2* 7.5* 7.7* 7.6* 7.7* 7.8* 7.8*  PHOS 8.9* 10.1* 11.2* 8.4* 9.2* 10.8* 8.3*   CBC Recent Labs  Lab 11/01/20 0326 11/01/20 2100 11/02/20 0422 11/03/20 0336 11/03/20 2002 11/04/20 0423  WBC 44.9*  --  44.2* 43.5*  --  46.9*  HGB 6.7*   < > 7.7* 6.5* 8.0* 7.6*  HCT 20.6*   < > 22.9* 20.3* 24.4*  22.4*  MCV 95.4  --  92.7 96.2  --  89.2  PLT 65*  --  52* 52*  --  62*   < > = values in this interval not displayed.    Medications:    . sodium chloride   Intravenous Once  . sodium chloride   Intravenous Once  . acetaminophen (TYLENOL) oral liquid 160 mg/5 mL  650 mg Per Tube Q6H  . amiodarone  400 mg Per Tube BID  . B-complex with vitamin C  1 tablet Per Tube Daily  . Chlorhexidine Gluconate Cloth  6 each Topical Q1200  . feeding supplement (NEPRO CARB STEADY)  237 mL Oral TID BM  . Gerhardt's butt cream   Topical TID  . insulin aspart  3-9 Units Subcutaneous Q4H  . lidocaine  1 patch Transdermal Q24H  . mouth rinse  15 mL Mouth Rinse BID  . phenol  1 spray Mouth/Throat Once  . predniSONE  40 mg Per Tube Q  breakfast  . sodium chloride flush  10-40 mL Intracatheter Q12H  . sodium chloride flush  3 mL Intravenous Q12H  . triamcinolone 0.1 % cream : eucerin   Topical BID    Dron Tanna Furry, MD  11/04/2020, 11:35 AM

## 2020-11-04 NOTE — Progress Notes (Signed)
PROGRESS NOTE    Raymond Hurley.  ZYS:[id number] DOB: 03/26/1931 DOA: 10/14/2020 PCP: Lajean Manes, MD   Brief Narrative:  87 year old man who is hypotensive in the context of acute diarrhea on 1/1 was admitted w/ dx of COVID and acute dehydration from acute diarrheal illness. Of note he was fully vaccinated and had received his booster. Given worsening delirium, multiple organ failure, and profound hypotension with CT abdomen showing pancreatitis.  Given advanced age and multiple organ dysfunction in addition to concern about further clinical decline patient was transitioned to critical care team.  1/4 > hypotension and A.fib RVR.  Transferring to ICU. Started CRRT 1/7 > Worsening leukocytosis.  CT abdomen pelvis with severe pancreatitis, possible iliopsoas abscess.  Deemed too unstable for intervention per surgery and interventional radiology 1/8 > started pressors.  Antibiotics broadened to meropenem and vancomycin.  Made DNR 1/12 > stopped CRRT 1/13 > iHD 1/14-15 > passed speech evaluation, tolerating p.o. well, NG tube removed 1/16 > acutely overnight noted to have hematochezia, hemoglobin 6.0, -Heparin drip stopped, hypotensive, concern for worsening clinical deterioration remains high risk for decompensation - 2u PRBC given 1/17 > Hgb improved - plan for HD if BP holds; Palliative to follow along given tenuous status. 1/18 > repeat hemoglobin drop - GI bleed resolved - this may be due to advancing kidney disease; poor prognosticator - family updated over group video chat at bedside 1/19 > Hgb continues to drop - FOBT positive - normal stool appearance 1/20 > Hgb WNL - family/patient want to continue to pursue fairly aggressive medical management to give patient the best chance for discharge back home 1/21 > Hgb down again overnight - 1u PRBC to be given with dialysis; HD tunneled cath planned but not performed 1/22 > Hgb stable - appears to be on a QoD transfusion schedule -  nephrology requesting oncology input prior to starting Epo/similar  Assessment & Plan:   Principal Problem:   Severe sepsis ([place]) Active Problems:   PAF (paroxysmal atrial fibrillation) ([place])   Anticoagulated   Pneumonia due to COVID-19 virus   Chronic systolic CHF (congestive heart failure) ([facility])   Elevated troponin   Facial weakness   Hypocalcemia   Macrocytic anemia   Acute metabolic encephalopathy   Acute respiratory failure ([facility])   AKI (acute kidney injury) ([place])   Dehydration   Persistent atrial fibrillation ([place])  Acute blood loss anemia to ongoing GI bleed requiring multiple transfusions; Concurrent anemia of kidney disease, profound dehydration and acute illness - Hemoglobin 7.6 today - Transfusion 1/12, 1/13, 1/16x2, 1/18, 1/19, 1/21 (7 total since admission) - Stool brown but remains heme positive - will continue to follow - not a candidate for endoscopy currently given stable labs and high risk for procedure. - Nephrology asking for oncology input on Epo/similar given his history  Acute hypoxic respiratory failure 2/2 aspiration PNA, resolved Cannot rule out concurrent/resolving COVID-19 infection  - On room air at rest - Continue early ambulation, incentive spirometry, flutter - Continue steroids - wean as tolelrated  Septic shock, resolved Possible iliac abscess - Previous team discussed with IR/CCS on 1/7, no surgery or drain indicated - Meropenem/linezolid completed - follow for fever curve/symptoms. Will likely need to involve ID if concern for worsening/re-infection given profound antibiotic use.  Acute metabolic encephalopathy, multifactorial: In the setting of uremia, septic shock, pancreatitis, anemia and COVID-19 infection Resolved - Continue supportive care, delirium precautions - Mental status appears to be improving drastically ANOx4 this morning; very  appropriate questions  Acute renal failure requiring HD Hypophosphatemia, mild - Suspect  secondary to ATN after recent diarrheal illness and volume depletion in the setting of profound disease, septic shock, COVID infection - Nephrology following - CRRT clotted off 10/25/20 - HD MWF ongoing as tolerated  Acute pancreatitis, resolving - Continue pain control, supportive care, leukocytosis still markedly elevated - NG tube/feeds removed 10/28/20 - tolerate PO well, passed SLP eval - Rectal tube to be removed 10/31/20 - more formed stool with questionable abdominal fullness this morning on exam  Afib, likely paroxysmal; questionably provoked Systolic cardiomyopathy Echo 1/3 with EF 55 - 60% - In and out of A. fib over the hospitalization  - Heparin drip stopped due to hematochezia 1/16 - Continue PO amio  Goals of care discussion  - At this point family and patient continued to impress upon the medical staff they would like to continue aggressive med care including hemodialysis, blood transfusions and procedures as indicated - We discussed that at his advanced age and multiple comorbid conditions he would have a difficult road ahead of him - Patient and family have agreed against intubation, CPR and remains DNR  DVT prophylaxis: SCDs only Heparin drip discontinued 10/29/2020 Code Status: DNR Family Communication: Updated previously over video conference with wife, son, daughter-in-law and other family members at length about poor prognosis, active bleed and ongoing management  Status is: Inpatient  Dispo: The patient is from: Home              Anticipated d/c is to: To be determined              Anticipated d/c date is: > 72 hours              Patient currently not medically stable for discharge  Consultants:   PCCM, nephrology, GI, cardiology, neurology, palliative care  Antimicrobials:  Unasyn 1/2 >> 1/4 Cefepime 1/4 >> 1/7 Flagyl 1/6 >> 1/7 Vancomycin 1/8 > 1/11  Meropenem 1/7 >> 1/18 Zyvox 1/12>  1/18  Subjective: No acute issues/events overnight; denies any  acute shortness of breath headache fever chills constipation diarrhea nausea vomiting.  Objective: Vitals:   11/04/20 0000 11/04/20 0421 11/04/20 0657 11/04/20 0759  BP: (!) 106/53 (!) 100/49  (!) 106/50  Pulse: 78 80  79  Resp: 18 20  18   Temp: 98.5 F (36.9 C) 98.4 F (36.9 C)  97.9 F (36.6 C)  TempSrc: Oral Oral  Oral  SpO2: 95%   95%  Weight:   100.4 kg   Height:        Intake/Output Summary (Last 24 hours) at 11/04/2020 0806 Last data filed at 11/03/2020 2114 Gross per 24 hour  Intake 80 ml  Output 1526 ml  Net -1446 ml   Filed Weights   11/03/20 1430 11/03/20 1738 11/04/20 0657  Weight: 99 kg 97.5 kg 100.4 kg    Examination:  General: Elderly frail gentleman sitting up in bed tolerating p.o. without any difficulty HEENT: Multiple excoriations and scabs to the face, sclerae nonicteric, noninjected.  Extraocular movements intact bilaterally. Neck: Right IJ dialysis access noted, bandage clean dry intact. Lungs: Diminished bilaterally without overt rhonchi, wheeze, or rales. Heart:  Regular rate and rhythm.  Without murmurs, rubs, or gallops. Abdomen:  Soft, nontender moderately distended, fullness with palpation to lower abdomen bilaterally.  Without guarding or rebound. Extremities: Without cyanosis, clubbing, edema, or obvious deformity. Vascular:  Dorsalis pedis and posterior tibial pulses palpable bilaterally. Skin:  Warm and dry, no  erythema, no ulcerations.  Data Reviewed: I have personally reviewed following labs and imaging studies  CBC: Recent Labs  Lab 10/31/20 0232 10/31/20 1235 11/01/20 0326 11/01/20 2100 11/02/20 0422 11/03/20 0336 11/03/20 2002 11/04/20 0423  WBC 49.3*  --  44.9*  --  44.2* 43.5*  --  46.9*  HGB 6.1*   < > 6.7* 7.8* 7.7* 6.5* 8.0* 7.6*  HCT 18.3*   < > 20.6* 23.7* 22.9* 20.3* 24.4* 22.4*  MCV 95.3  --  95.4  --  92.7 96.2  --  89.2  PLT 63*  --  65*  --  52* 52*  --  62*   < > = values in this interval not displayed.    Basic Metabolic Panel: Recent Labs  Lab 10/31/20 0232 10/31/20 1630 11/01/20 0326 11/02/20 0422 11/02/20 1546 11/03/20 0336 11/04/20 0423  NA 135   < > 133* 137 137 137 136  K 4.3   < > 4.9 4.2 4.5 4.6 4.0  CL 97*   < > 96* 99 100 100 97*  CO2 21*   < > 19* 23 20* 20* 22  GLUCOSE 95   < > 75 93 180* 89 93  BUN 72*   < > 101* 69* 81* 95* 65*  CREATININE 4.82*   < > 6.40* 4.98* 5.88* 6.55* 4.78*  CALCIUM 7.2*   < > 7.7* 7.6* 7.7* 7.8* 7.8*  MG 2.1  --  2.4 2.2  --  2.3 2.0  PHOS 8.9*   < > 11.2* 8.4* 9.2* 10.8* 8.3*   < > = values in this interval not displayed.   GFR: Estimated Creatinine Clearance: 12.5 mL/min (A) (by C-G formula based on SCr of 4.78 mg/dL (H)). Liver Function Tests: Recent Labs  Lab 11/01/20 0326 11/02/20 0422 11/02/20 1546 11/03/20 0336 11/04/20 0423  ALBUMIN 1.9* 1.8* 1.8* 1.8* 1.9*   No results for input(s): LIPASE, AMYLASE in the last 168 hours. No results for input(s): AMMONIA in the last 168 hours. Coagulation Profile: No results for input(s): INR, PROTIME in the last 168 hours. Cardiac Enzymes: No results for input(s): CKTOTAL, CKMB, CKMBINDEX, TROPONINI in the last 168 hours. BNP (last 3 results) No results for input(s): PROBNP in the last 8760 hours. HbA1C: No results for input(s): HGBA1C in the last 72 hours. CBG: Recent Labs  Lab 11/03/20 1826 11/03/20 2021 11/04/20 0033 11/04/20 0423 11/04/20 0758  GLUCAP 116* 147* 84 77 89   Lipid Profile: No results for input(s): CHOL, HDL, LDLCALC, TRIG, CHOLHDL, LDLDIRECT in the last 72 hours. Thyroid Function Tests: No results for input(s): TSH, T4TOTAL, FREET4, T3FREE, THYROIDAB in the last 72 hours. Anemia Panel: Recent Labs    11/04/20 0423  FERRITIN 1,197*  TIBC 223*  IRON 54   Sepsis Labs: No results for input(s): PROCALCITON, LATICACIDVEN in the last 168 hours.  No results found for this or any previous visit (from the past 240 hour(s)).  Radiology Studies: No results  found.  Scheduled Meds: . sodium chloride   Intravenous Once  . sodium chloride   Intravenous Once  . acetaminophen (TYLENOL) oral liquid 160 mg/5 mL  650 mg Per Tube Q6H  . amiodarone  400 mg Per Tube BID  . B-complex with vitamin C  1 tablet Per Tube Daily  . Chlorhexidine Gluconate Cloth  6 each Topical Q1200  . feeding supplement (NEPRO CARB STEADY)  237 mL Oral TID BM  . Gerhardt's butt cream   Topical TID  . insulin aspart  3-9 Units Subcutaneous Q4H  . lidocaine  1 patch Transdermal Q24H  . mouth rinse  15 mL Mouth Rinse BID  . phenol  1 spray Mouth/Throat Once  . predniSONE  40 mg Per Tube Q breakfast  . sodium chloride flush  10-40 mL Intracatheter Q12H  . sodium chloride flush  3 mL Intravenous Q12H  . triamcinolone 0.1 % cream : eucerin   Topical BID   Continuous Infusions: . sodium chloride Stopped (10/27/20 0931)     LOS: 21 days   Time spent: 63min  Elliana Bal C Fredrik Mogel, DO [facility]  If 7PM-7AM, please contact night-coverage [email or web address]  11/04/2020, 8:06 AM

## 2020-11-05 DIAGNOSIS — A419 Sepsis, unspecified organism: Secondary | ICD-10-CM | POA: Diagnosis not present

## 2020-11-05 DIAGNOSIS — R6520 Severe sepsis without septic shock: Secondary | ICD-10-CM | POA: Diagnosis not present

## 2020-11-05 LAB — GLUCOSE, CAPILLARY
Glucose-Capillary: 109 mg/dL — ABNORMAL HIGH (ref 70–99)
Glucose-Capillary: 125 mg/dL — ABNORMAL HIGH (ref 70–99)
Glucose-Capillary: 133 mg/dL — ABNORMAL HIGH (ref 70–99)
Glucose-Capillary: 146 mg/dL — ABNORMAL HIGH (ref 70–99)
Glucose-Capillary: 52 mg/dL — ABNORMAL LOW (ref 70–99)
Glucose-Capillary: 62 mg/dL — ABNORMAL LOW (ref 70–99)
Glucose-Capillary: 85 mg/dL (ref 70–99)
Glucose-Capillary: 88 mg/dL (ref 70–99)

## 2020-11-05 LAB — CBC
HCT: 19.8 % — ABNORMAL LOW (ref 39.0–52.0)
Hemoglobin: 6.6 g/dL — CL (ref 13.0–17.0)
MCH: 30 pg (ref 26.0–34.0)
MCHC: 33.3 g/dL (ref 30.0–36.0)
MCV: 90 fL (ref 80.0–100.0)
Platelets: 69 10*3/uL — ABNORMAL LOW (ref 150–400)
RBC: 2.2 MIL/uL — ABNORMAL LOW (ref 4.22–5.81)
RDW: 21.3 % — ABNORMAL HIGH (ref 11.5–15.5)
WBC: 38.9 10*3/uL — ABNORMAL HIGH (ref 4.0–10.5)
nRBC: 0 % (ref 0.0–0.2)

## 2020-11-05 LAB — RENAL FUNCTION PANEL
Albumin: 1.7 g/dL — ABNORMAL LOW (ref 3.5–5.0)
Anion gap: 17 — ABNORMAL HIGH (ref 5–15)
BUN: 88 mg/dL — ABNORMAL HIGH (ref 8–23)
CO2: 20 mmol/L — ABNORMAL LOW (ref 22–32)
Calcium: 7.8 mg/dL — ABNORMAL LOW (ref 8.9–10.3)
Chloride: 101 mmol/L (ref 98–111)
Creatinine, Ser: 6.07 mg/dL — ABNORMAL HIGH (ref 0.61–1.24)
GFR, Estimated: 8 mL/min — ABNORMAL LOW (ref >60)
Glucose, Bld: 60 mg/dL — ABNORMAL LOW (ref 70–99)
Phosphorus: 9.8 mg/dL — ABNORMAL HIGH (ref 2.5–4.6)
Potassium: 4.3 mmol/L (ref 3.5–5.1)
Sodium: 138 mmol/L (ref 135–145)

## 2020-11-05 LAB — MAGNESIUM: Magnesium: 2.1 mg/dL (ref 1.7–2.4)

## 2020-11-05 LAB — PREPARE RBC (CROSSMATCH)

## 2020-11-05 LAB — LACTATE DEHYDROGENASE: LDH: 518 U/L — ABNORMAL HIGH (ref 98–192)

## 2020-11-05 MED ORDER — CEFAZOLIN SODIUM-DEXTROSE 2-4 GM/100ML-% IV SOLN
2.0000 g | INTRAVENOUS | Status: AC
  Filled 2020-11-05: qty 100

## 2020-11-05 MED ORDER — DIPHENHYDRAMINE HCL 25 MG PO CAPS
25.0000 mg | ORAL_CAPSULE | Freq: Once | ORAL | Status: AC
  Administered 2020-11-05: 25 mg via ORAL
  Filled 2020-11-05: qty 1

## 2020-11-05 MED ORDER — CHLORHEXIDINE GLUCONATE CLOTH 2 % EX PADS
6.0000 | MEDICATED_PAD | Freq: Every day | CUTANEOUS | Status: DC
  Administered 2020-11-06 – 2020-11-10 (×5): 6 via TOPICAL

## 2020-11-05 MED ORDER — PANTOPRAZOLE SODIUM 40 MG PO TBEC
40.0000 mg | DELAYED_RELEASE_TABLET | Freq: Every day | ORAL | Status: DC
  Administered 2020-11-05 – 2020-11-07 (×2): 40 mg via ORAL
  Filled 2020-11-05 (×2): qty 1

## 2020-11-05 MED ORDER — DEXTROSE 50 % IV SOLN
1.0000 | Freq: Once | INTRAVENOUS | Status: DC
  Filled 2020-11-05: qty 50

## 2020-11-05 MED ORDER — ACETAMINOPHEN 325 MG PO TABS
650.0000 mg | ORAL_TABLET | Freq: Once | ORAL | Status: AC
  Administered 2020-11-05: 650 mg via ORAL
  Filled 2020-11-05: qty 2

## 2020-11-05 MED ORDER — SODIUM CHLORIDE 0.9% IV SOLUTION: Freq: Once | INTRAVENOUS | Status: DC

## 2020-11-05 NOTE — Progress Notes (Signed)
Reached out to Dr. Jiles Prows to see if patient can come out of isolation.

## 2020-11-05 NOTE — Progress Notes (Signed)
PROGRESS NOTE    Raymond Hurley.  JKK:[id number] DOB: 1931-10-13 DOA: 10/14/2020 PCP: Lajean Manes, MD   Brief Narrative:   87 year old man who is hypotensive in the context of acute diarrhea on 1/1 was admitted w/ dx of COVID and acute dehydration from acute diarrheal illness. Of note he was fully vaccinated and had received his booster. Given worsening delirium, multiple organ failure, and profound hypotension with CT abdomen showing pancreatitis. Given advanced age and multiple organ dysfunction in addition to concern about further clinical decline patient was transitioned to critical care team.  11/05/20: Patient is 22 days from positive COVID result. Ok to take out of isolation. Check LDH, haptoglobin. Add protonix.   Assessment & Plan:  Acute blood loss anemia to ongoing GI bleed requiring multiple transfusions; Concurrent anemia of kidney disease, profound dehydration and acute illness     - Hgb 6.6 today; will receive 1 unit pRBCs; multiple transfusions during this stay     - he has had heme-positive stools, but is not a candidate for endoscopy currently given stable labs and high risk for procedure.     - Nephrology starting Epo and iron     - check LDH, haptoglobin  Acute hypoxic respiratory failure 2/2 aspiration PNA, resolved Cannot rule out concurrent/resolving COVID-19 infection      - good sats on RA     - continue ambulation, IS, FV     - continue prednisone 40mg  qday; wean as able  Septic shock, resolved Possible iliac abscess     - case d/w IR/CCS on 1/7, no surgery or drain indicated     - Meropenem/linezolid completed - follow for fever curve/symptoms. Will likely need to involve ID if concern for worsening/re-infection given profound antibiotic use.  Acute metabolic encephalopathy, multifactorial: In the setting of uremia, septic shock, pancreatitis, anemia and COVID-19 infection; Resolved     - continue supportive care, delirium precautions  Acute  renal failure requiring HD Hypophosphatemia, mild     - Suspect secondary to ATN after recent diarrheal illness and volume depletion in the setting of profound disease, septic shock, COVID infection     - Nephrology following     - CRRT clotted off 10/25/20 - HD MWF ongoing as tolerated  Acute pancreatitis     - continue pain control, supportive care     - white count trending down (38.9) today     - NG tube/feeds removed 10/28/20 - tolerate PO well, passed SLP eval     - Rectal tube removed  Afib, likely paroxysmal; questionably provoked Systolic cardiomyopathy     - Echo 1/3 with EF 55 - 60%     - In and out of A. fib over the hospitalization      - Heparin drip stopped due to hematochezia 1/16     - continue amiodarone  Goals of care discussion      - At this point family and patient continued to impress upon the medical staff they would like to continue aggressive med care including hemodialysis, blood transfusions and procedures as indicated     - Patient and family have agreed against intubation, CPR; he is DNR  DVT prophylaxis: SCDs Code Status: DNR Family Communication: None at bedside.   Status is: Inpatient  Remains inpatient appropriate because:Inpatient level of care appropriate due to severity of illness   Dispo: The patient is from: Home              Anticipated  d/c is to: TBD              Anticipated d/c date is: > 3 days              Patient currently is not medically stable to d/c.   Difficult to place patient No  Consultants:   Nephrology  PCCM  GI  Cardiology  Neurology  Palliative Care  Subjective: "I'm doing well. Nice to meet you."  Objective: Vitals:   11/05/20 0000 11/05/20 0336 11/05/20 0600 11/05/20 0641  BP: (!) 100/50 (!) 102/50  (!) 108/51  Pulse: 79 65  69  Resp: 16 18  19   Temp: 97.9 F (36.6 C) 97.7 F (36.5 C)  97.8 F (36.6 C)  TempSrc: Oral Oral  Oral  SpO2: 96% 94%  94%  Weight:   98.5 kg   Height:         Intake/Output Summary (Last 24 hours) at 11/05/2020 0703 Last data filed at 11/05/2020 0600 Gross per 24 hour  Intake 590 ml  Output --  Net 590 ml   Filed Weights   11/03/20 1738 11/04/20 0657 11/05/20 0600  Weight: 97.5 kg 100.4 kg 98.5 kg    Examination:  General: 87 y.o. male resting in bed in NAD Eyes: PERRL, normal sclera ENMT: Nares patent w/o discharge, orophaynx clear, dentition normal, ears w/o discharge/lesions/ulcers Neck: Supple, trachea midline Cardiovascular: RRR, +S1, S2, no m/g/r, equal pulses throughout Respiratory: decreased at bases, no w/r/r, normal WOB GI: BS+, NDNT, no masses noted, no organomegaly noted MSK: No e/c/c Neuro: A&O x 3, no focal deficits Psyc: Appropriate interaction and affect, calm/cooperative   Data Reviewed: I have personally reviewed following labs and imaging studies.  CBC: Recent Labs  Lab 11/01/20 0326 11/01/20 2100 11/02/20 0422 11/03/20 0336 11/03/20 2002 11/04/20 0423 11/05/20 0315  WBC 44.9*  --  44.2* 43.5*  --  46.9* 38.9*  HGB 6.7*   < > 7.7* 6.5* 8.0* 7.6* 6.6*  HCT 20.6*   < > 22.9* 20.3* 24.4* 22.4* 19.8*  MCV 95.4  --  92.7 96.2  --  89.2 90.0  PLT 65*  --  52* 52*  --  62* 69*   < > = values in this interval not displayed.   Basic Metabolic Panel: Recent Labs  Lab 11/01/20 0326 11/02/20 0422 11/02/20 1546 11/03/20 0336 11/04/20 0423 11/05/20 0315  NA 133* 137 137 137 136 138  K 4.9 4.2 4.5 4.6 4.0 4.3  CL 96* 99 100 100 97* 101  CO2 19* 23 20* 20* 22 20*  GLUCOSE 75 93 180* 89 93 60*  BUN 101* 69* 81* 95* 65* 88*  CREATININE 6.40* 4.98* 5.88* 6.55* 4.78* 6.07*  CALCIUM 7.7* 7.6* 7.7* 7.8* 7.8* 7.8*  MG 2.4 2.2  --  2.3 2.0 2.1  PHOS 11.2* 8.4* 9.2* 10.8* 8.3* 9.8*   GFR: Estimated Creatinine Clearance: 9.8 mL/min (A) (by C-G formula based on SCr of 6.07 mg/dL (H)). Liver Function Tests: Recent Labs  Lab 11/02/20 0422 11/02/20 1546 11/03/20 0336 11/04/20 0423 11/05/20 0315  ALBUMIN  1.8* 1.8* 1.8* 1.9* 1.7*   No results for input(s): LIPASE, AMYLASE in the last 168 hours. No results for input(s): AMMONIA in the last 168 hours. Coagulation Profile: No results for input(s): INR, PROTIME in the last 168 hours. Cardiac Enzymes: No results for input(s): CKTOTAL, CKMB, CKMBINDEX, TROPONINI in the last 168 hours. BNP (last 3 results) No results for input(s): PROBNP in the last 8760 hours. HbA1C:  No results for input(s): HGBA1C in the last 72 hours. CBG: Recent Labs  Lab 11/04/20 2156 11/05/20 0008 11/05/20 0335 11/05/20 0404 11/05/20 0429  GLUCAP 236* 146* 52* 62* 85   Lipid Profile: No results for input(s): CHOL, HDL, LDLCALC, TRIG, CHOLHDL, LDLDIRECT in the last 72 hours. Thyroid Function Tests: No results for input(s): TSH, T4TOTAL, FREET4, T3FREE, THYROIDAB in the last 72 hours. Anemia Panel: Recent Labs    11/04/20 0423  FERRITIN 1,197*  TIBC 223*  IRON 54   Sepsis Labs: No results for input(s): PROCALCITON, LATICACIDVEN in the last 168 hours.  No results found for this or any previous visit (from the past 240 hour(s)).    Radiology Studies: No results found.   Scheduled Meds: . sodium chloride   Intravenous Once  . sodium chloride   Intravenous Once  . sodium chloride   Intravenous Once  . acetaminophen (TYLENOL) oral liquid 160 mg/5 mL  650 mg Per Tube Q6H  . amiodarone  400 mg Per Tube BID  . B-complex with vitamin C  1 tablet Per Tube Daily  . Chlorhexidine Gluconate Cloth  6 each Topical Q1200  . darbepoetin (ARANESP) injection - NON-DIALYSIS  100 mcg Subcutaneous Once  . [START ON 11/18/2020] darbepoetin (ARANESP) injection - DIALYSIS  60 mcg Intravenous Q Sat-HD  . dextrose  1 ampule Intravenous Once  . feeding supplement (NEPRO CARB STEADY)  237 mL Oral TID BM  . Gerhardt's butt cream   Topical TID  . insulin aspart  3-9 Units Subcutaneous Q4H  . lidocaine  1 patch Transdermal Q24H  . mouth rinse  15 mL Mouth Rinse BID  . phenol  1  spray Mouth/Throat Once  . predniSONE  40 mg Per Tube Q breakfast  . sodium chloride flush  10-40 mL Intracatheter Q12H  . sodium chloride flush  3 mL Intravenous Q12H  . triamcinolone 0.1 % cream : eucerin   Topical BID   Continuous Infusions: . sodium chloride Stopped (10/27/20 0931)  . [START ON 11/06/2020]  ceFAZolin (ANCEF) IV    . ferric gluconate (FERRLECIT/NULECIT) IV Stopped (11/04/20 1843)     LOS: 22 days    Time spent: 25 minutes spent in the coordination of care today.    Jonnie Finner, DO [facility]  If 7PM-7AM, please contact night-coverage [email or web address] 11/05/2020, 7:03 AM

## 2020-11-05 NOTE — Progress Notes (Signed)
[facility] Progress Note     Assessment/ Plan:   1. Dialysis dependent AKI 2/2 ATN 1. CRRT 1/4-1/12. And now tol iHD on MWF COVID shift 2. Patient/family desire ongoing dialysis after discussion on 1/20 3. IR to place tunneled dialysis catheter, on 1/24 4. Began clip process as AKI, SW aware 5. Status post HD on 1/21 with 1.5 L UF, tolerated well.  Next HD on 1/24, ordered.  Need to coordinate with IR procedure.  2. Acute pancreatitis: Noted on CT scan 10/16/20 and again on 1/7. 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  3. Acute hypoxemic RF: likely aspiration PNA +/- COVID pneumonitis, stable o2 req. Steroids.  Resolved  4. Shock: high WBC count, w/u per primary team. Possible ilacus abscess on CT from 1/7. Off levophed 1/11. Mero, zyvox.    5. COVID-19 infection: vaccinated and boosted- treatment per primary  6. Acute encephalopathy: multifactorial, likely COVID + acute illness.  8.  Chronic systolic CHF: EF 04/19/15 on TTE 60-65% with no WMA.  9.  Afib: hep and amiodarone po  10. Acute anemia: Received multiple blood transfusion.  He has a history of metastatic melanoma.  On 1/22, I discussed with multiple family members via video call in the patient's room.  The family discussed with the patient's oncologist at [facility] who reportedly said no contraindication for erythropoietin to manage anemia, I agree.  Iron saturation 24 with high ferritin level.  I ordered IV iron and started ESA.  Monitor hemoglobin. Hb dropped to 6.6 today, no sign of bleeding. Receiving PRBC today.    Subjective:   No new event.  Receiving blood transfusion.  Denies nausea vomiting chest pain shortness of breath.  He was on video call with his multiple family members.  I also discussed with them.   Objective:   BP (!) 110/50   Pulse 73   Temp 98.4 F (36.9 C) (Oral)   Resp 20   Ht 5\' 8"  (1.727 m)   Wt 98.5  kg   SpO2 98%   BMI 33.02 kg/m   Intake/Output Summary (Last 24 hours) at 11/05/2020 1314 Last data filed at 11/05/2020 0600 Gross per 24 hour  Intake 350 ml  Output --  Net 350 ml   Weight change: -0.5 kg  Physical Exam: GEN: Not in distress, comfortable NECK: Supple, no thyromegaly LUNGS: Clear bilateral, no rhonchi CV: RRR, No M/R/G ABD: SNDNT +BS  EXT: No lower extremity edema ACCESS: RIJ temp  site clean  Imaging: No results found.  Labs: BMET Recent Labs  Lab 10/31/20 1630 11/01/20 0326 11/02/20 0422 11/02/20 1546 11/03/20 0336 11/04/20 0423 11/05/20 0315  NA 133* 133* 137 137 137 136 138  K 4.8 4.9 4.2 4.5 4.6 4.0 4.3  CL 95* 96* 99 100 100 97* 101  CO2 20* 19* 23 20* 20* 22 20*  GLUCOSE 130* 75 93 180* 89 93 60*  BUN 89* 101* 69* 81* 95* 65* 88*  CREATININE 5.71* 6.40* 4.98* 5.88* 6.55* 4.78* 6.07*  CALCIUM 7.5* 7.7* 7.6* 7.7* 7.8* 7.8* 7.8*  PHOS 10.1* 11.2* 8.4* 9.2* 10.8* 8.3* 9.8*   CBC Recent Labs  Lab 11/02/20 0422 11/03/20 0336 11/03/20 2002 11/04/20 0423 11/05/20 0315  WBC 44.2* 43.5*  --  46.9* 38.9*  HGB 7.7* 6.5* 8.0* 7.6* 6.6*  HCT 22.9* 20.3* 24.4* 22.4* 19.8*  MCV 92.7 96.2  --  89.2 90.0  PLT 52* 52*  --  62* 69*    Medications:    . sodium chloride   Intravenous Once  . sodium chloride   Intravenous Once  . acetaminophen (TYLENOL) oral liquid 160 mg/5 mL  650 mg Per Tube Q6H  . amiodarone  400 mg Per Tube BID  . B-complex with vitamin C  1 tablet Per Tube Daily  . Chlorhexidine Gluconate Cloth  6 each Topical Q1200  . darbepoetin (ARANESP) injection - NON-DIALYSIS  100 mcg Subcutaneous Once  . [START ON 11/18/2020] darbepoetin (ARANESP) injection - DIALYSIS  60 mcg Intravenous Q Sat-HD  . dextrose  1 ampule Intravenous Once  . feeding supplement (NEPRO CARB STEADY)  237 mL Oral TID BM  . Gerhardt's butt cream   Topical TID  . insulin aspart  3-9 Units Subcutaneous Q4H  . lidocaine  1 patch Transdermal Q24H  . mouth rinse   15 mL Mouth Rinse BID  . pantoprazole  40 mg Oral Daily  . phenol  1 spray Mouth/Throat Once  . predniSONE  40 mg Per Tube Q breakfast  . sodium chloride flush  10-40 mL Intracatheter Q12H  . sodium chloride flush  3 mL Intravenous Q12H  . triamcinolone 0.1 % cream : eucerin   Topical BID    Holley Wirt Tanna Furry, MD  11/05/2020, 1:14 PM

## 2020-11-06 ENCOUNTER — Inpatient Hospital Stay (HOSPITAL_COMMUNITY): Payer: Medicare Other

## 2020-11-06 DIAGNOSIS — A419 Sepsis, unspecified organism: Secondary | ICD-10-CM | POA: Diagnosis not present

## 2020-11-06 DIAGNOSIS — R6520 Severe sepsis without septic shock: Secondary | ICD-10-CM | POA: Diagnosis not present

## 2020-11-06 HISTORY — PX: IR US GUIDE VASC ACCESS RIGHT: IMG2390

## 2020-11-06 HISTORY — PX: IR FLUORO GUIDE CV LINE RIGHT: IMG2283

## 2020-11-06 LAB — CBC
HCT: 25 % — ABNORMAL LOW (ref 39.0–52.0)
HCT: 24.4 % — ABNORMAL LOW (ref 39.0–52.0)
Hemoglobin: 8.1 g/dL — ABNORMAL LOW (ref 13.0–17.0)
Hemoglobin: 8.4 g/dL — ABNORMAL LOW (ref 13.0–17.0)
MCH: 28.9 pg (ref 26.0–34.0)
MCH: 30.4 pg (ref 26.0–34.0)
MCHC: 32.4 g/dL (ref 30.0–36.0)
MCHC: 34.4 g/dL (ref 30.0–36.0)
MCV: 89.3 fL (ref 80.0–100.0)
MCV: 88.4 fL (ref 80.0–100.0)
Platelets: 75 10*3/uL — ABNORMAL LOW (ref 150–400)
Platelets: 84 10*3/uL — ABNORMAL LOW (ref 150–400)
RBC: 2.8 MIL/uL — ABNORMAL LOW (ref 4.22–5.81)
RBC: 2.76 MIL/uL — ABNORMAL LOW (ref 4.22–5.81)
RDW: 20.7 % — ABNORMAL HIGH (ref 11.5–15.5)
RDW: 21 % — ABNORMAL HIGH (ref 11.5–15.5)
WBC: 39.3 10*3/uL — ABNORMAL HIGH (ref 4.0–10.5)
WBC: 46.6 10*3/uL — ABNORMAL HIGH (ref 4.0–10.5)
nRBC: 0.1 % (ref 0.0–0.2)
nRBC: 0 % (ref 0.0–0.2)

## 2020-11-06 LAB — TYPE AND SCREEN
ABO/RH(D): O POS
Antibody Screen: NEGATIVE
Unit division: 0

## 2020-11-06 LAB — HEPATIC FUNCTION PANEL
ALT: 25 U/L (ref 0–44)
AST: 32 U/L (ref 15–41)
Albumin: 2 g/dL — ABNORMAL LOW (ref 3.5–5.0)
Alkaline Phosphatase: 125 U/L (ref 38–126)
Bilirubin, Direct: 0.2 mg/dL (ref 0.0–0.2)
Indirect Bilirubin: 0.9 mg/dL (ref 0.3–0.9)
Total Bilirubin: 1.1 mg/dL (ref 0.3–1.2)
Total Protein: 4.9 g/dL — ABNORMAL LOW (ref 6.5–8.1)

## 2020-11-06 LAB — BPAM RBC
Blood Product Expiration Date: 202202232359
ISSUE DATE / TIME: 202201230647
Unit Type and Rh: 5100

## 2020-11-06 LAB — RENAL FUNCTION PANEL
Albumin: 1.8 g/dL — ABNORMAL LOW (ref 3.5–5.0)
Anion gap: 19 — ABNORMAL HIGH (ref 5–15)
BUN: 110 mg/dL — ABNORMAL HIGH (ref 8–23)
CO2: 17 mmol/L — ABNORMAL LOW (ref 22–32)
Calcium: 7.8 mg/dL — ABNORMAL LOW (ref 8.9–10.3)
Chloride: 101 mmol/L (ref 98–111)
Creatinine, Ser: 7.26 mg/dL — ABNORMAL HIGH (ref 0.61–1.24)
GFR, Estimated: 7 mL/min — ABNORMAL LOW (ref >60)
Glucose, Bld: 123 mg/dL — ABNORMAL HIGH (ref 70–99)
Phosphorus: 10.6 mg/dL — ABNORMAL HIGH (ref 2.5–4.6)
Potassium: 4.6 mmol/L (ref 3.5–5.1)
Sodium: 137 mmol/L (ref 135–145)

## 2020-11-06 LAB — HAPTOGLOBIN: Haptoglobin: 299 mg/dL (ref 38–329)

## 2020-11-06 LAB — GLUCOSE, CAPILLARY
Glucose-Capillary: 105 mg/dL — ABNORMAL HIGH (ref 70–99)
Glucose-Capillary: 127 mg/dL — ABNORMAL HIGH (ref 70–99)
Glucose-Capillary: 129 mg/dL — ABNORMAL HIGH (ref 70–99)
Glucose-Capillary: 76 mg/dL (ref 70–99)
Glucose-Capillary: 83 mg/dL (ref 70–99)
Glucose-Capillary: 85 mg/dL (ref 70–99)
Glucose-Capillary: 93 mg/dL (ref 70–99)

## 2020-11-06 LAB — MAGNESIUM: Magnesium: 2.2 mg/dL (ref 1.7–2.4)

## 2020-11-06 MED ORDER — GELATIN ABSORBABLE 12-7 MM EX MISC
CUTANEOUS | Status: AC
  Filled 2020-11-06: qty 1

## 2020-11-06 MED ORDER — CHLORHEXIDINE GLUCONATE 4 % EX LIQD
CUTANEOUS | Status: AC
  Filled 2020-11-06: qty 15

## 2020-11-06 MED ORDER — LIDOCAINE HCL (PF) 1 % IJ SOLN
INTRAMUSCULAR | Status: AC | PRN
  Administered 2020-11-06: 10 mL

## 2020-11-06 MED ORDER — FENTANYL CITRATE (PF) 100 MCG/2ML IJ SOLN
INTRAMUSCULAR | Status: AC
  Filled 2020-11-06: qty 2

## 2020-11-06 MED ORDER — CEFAZOLIN SODIUM-DEXTROSE 2-4 GM/100ML-% IV SOLN
INTRAVENOUS | Status: AC
  Administered 2020-11-06: 2 g via INTRAVENOUS
  Filled 2020-11-06: qty 100

## 2020-11-06 MED ORDER — LOPERAMIDE HCL 2 MG PO CAPS
2.0000 mg | ORAL_CAPSULE | Freq: Once | ORAL | Status: AC
  Administered 2020-11-07: 2 mg via ORAL
  Filled 2020-11-06: qty 1

## 2020-11-06 MED ORDER — LIDOCAINE HCL 1 % IJ SOLN
INTRAMUSCULAR | Status: AC
  Filled 2020-11-06: qty 20

## 2020-11-06 MED ORDER — FENTANYL CITRATE (PF) 100 MCG/2ML IJ SOLN
INTRAMUSCULAR | Status: AC | PRN
  Administered 2020-11-06: 25 ug via INTRAVENOUS

## 2020-11-06 MED ORDER — MIDAZOLAM HCL 2 MG/2ML IJ SOLN
INTRAMUSCULAR | Status: AC | PRN
  Administered 2020-11-06 (×2): 0.5 mg via INTRAVENOUS

## 2020-11-06 MED ORDER — HEPARIN SODIUM (PORCINE) 1000 UNIT/ML IJ SOLN
INTRAMUSCULAR | Status: AC
  Filled 2020-11-06: qty 1

## 2020-11-06 MED ORDER — DARBEPOETIN ALFA 100 MCG/0.5ML IJ SOSY
100.0000 ug | PREFILLED_SYRINGE | INTRAMUSCULAR | Status: DC
  Filled 2020-11-06: qty 0.5

## 2020-11-06 MED ORDER — MIDAZOLAM HCL 2 MG/2ML IJ SOLN
INTRAMUSCULAR | Status: AC
  Filled 2020-11-06: qty 2

## 2020-11-06 NOTE — Procedures (Signed)
Interventional Radiology Procedure Note  Procedure: Placement of a right IJ approach  Tunneled 19cm tip to cuff HD catheter.   Tip is positioned at the superior cavoatrial junction and catheter is ready for immediate use.  Complications: None Recommendations:  - Ok to use - Do not submerge - Routine line care   Signed,  Dulcy Fanny. Earleen Newport, DO

## 2020-11-06 NOTE — TOC Initial Note (Signed)
Transition of Care (TOC) - Initial/Assessment Note    Patient Details  Name: Raymond Hurley. MRN: [id number] Date of Birth: 02/13/1931  [facility]) CM/SW Contact:    Coralee Pesa, [place] Phone Number: 11/06/2020, 5:16 PM  Clinical Narrative:                 CSW noted that pt was in a procedure, but was notified that dtr wanted an update and to discuss next steps. Dtr Linus Orn noted that pt was very independent and that the family conference calls multiple times a day and are all on the same page. CSW and dtr discussed SNF recommendation and dtr stated family was agreeable to short term placement with the end goal of him returning home with his wife. Linus Orn advised that the dr had stated pt may move to a non covid unit soon as pt is on day 23 at the hospital.   Dtr stated that they are interested in [place] and that she had spoken to [facility] in admission. She also was interested in [place] as the pt is familiar with their dr. There. She said they were agreeable to a full faxout in the [place] area and said she also knew of [place].   CSW and Linus Orn spoke at length about the SNF process and the barriers that dialysis can pose. It was noted that pt had been in procedure, so CSW did not follow up with him this afternoon, but he should be updated. CSW noted that SW would continue to follow with family and help them to transition care to the next appropriate level. CSW will complete FL2 and faxout. SW will follow up with pt and dtr tomorrow.  Expected Discharge Plan: Skilled Nursing Facility Barriers to Discharge: SNF Pending bed offer,Waiting for outpatient dialysis,Continued Medical Work up   Patient Goals and CMS Choice Patient states their goals for this hospitalization and ongoing recovery are:: Pt and family agreeable to short term SNF placement. CMS Medicare.gov Compare Post Acute Care list provided to:: Patient Represenative (must comment) (Daughter,  Company secretary) Choice offered to / list presented to : [place]  Expected Discharge Plan and Services Expected Discharge Plan: [place] Choice: [place] Living arrangements for the past 2 months: Apartment                                      Prior Living Arrangements/Services Living arrangements for the past 2 months: Apartment Lives with:: Spouse Patient language and need for interpreter reviewed:: Yes Do you feel safe going back to the place where you live?: Yes      Need for Family Participation in Patient Care: No (Comment) Care giver support system in place?: Yes (comment)   Criminal Activity/Legal Involvement Pertinent to Current Situation/Hospitalization: No - Comment as needed  Activities of Daily Living Home Assistive Devices/Equipment: None ADL Screening (condition at time of admission) Patient's cognitive ability adequate to safely complete daily activities?: No Is the patient deaf or have difficulty hearing?: No Does the patient have difficulty seeing, even when wearing glasses/contacts?: No Does the patient have difficulty concentrating, remembering, or making decisions?: Yes Patient able to express need for assistance with ADLs?: Yes Does the patient have difficulty dressing or bathing?: Yes Independently performs ADLs?: No Communication: Independent Dressing (OT): Needs assistance Is this a change from baseline?:  Change from baseline, expected to last >3 days Grooming: Needs assistance Is this a change from baseline?: Change from baseline, expected to last >3 days Feeding: Needs assistance Is this a change from baseline?: Change from baseline, expected to last >3 days Bathing: Dependent Is this a change from baseline?: Change from baseline, expected to last >3 days Toileting: Dependent Is this a change from baseline?: Change from baseline, expected to last >3days In/Out Bed: Needs  assistance,Dependent Is this a change from baseline?: Change from baseline, expected to last >3 days Walks in Home: Needs assistance Is this a change from baseline?: Change from baseline, expected to last >3 days Does the patient have difficulty walking or climbing stairs?: Yes Weakness of Legs: Both Weakness of Arms/Hands: Both  Permission Sought/Granted Permission sought to share information with : Family Supports Permission granted to share information with : Yes, Verbal Permission Granted  Share Information with NAME: Saunders Revel     Permission granted to share info w Relationship: Daughter  Permission granted to share info w Contact Information: [phone]  Emotional Assessment Appearance:: Other (Comment Required (Unable to Assess) Attitude/Demeanor/Rapport: Unable to Assess Affect (typically observed): Unable to Assess Orientation: : Oriented to Self,Oriented to Place,Oriented to  Time,Oriented to Situation Alcohol / Substance Use: Not Applicable Psych Involvement: No (comment)  Admission diagnosis:  Hypocalcemia [E83.51] Diarrhea [R19.7] Dehydration [E86.0] TIA (transient ischemic attack) [G45.9] SOB (shortness of breath) [R06.02] AKI (acute kidney injury) ([place]) [N17.9] Constipation [K59.00] Severe sepsis ([place]) [A41.9, R65.20] Patient Active Problem List   Diagnosis Date Noted  . Acute respiratory failure ([place])   . AKI (acute kidney injury) ([place])   . Dehydration   . Persistent atrial fibrillation ([place])   . Severe sepsis ([place]) 10/14/2020  . Pneumonia due to COVID-19 virus 10/14/2020  . Chronic systolic CHF (congestive heart failure) ([place]) 10/14/2020  . Elevated troponin 10/14/2020  . Facial weakness 10/14/2020  . Hypocalcemia 10/14/2020  . Macrocytic anemia 10/14/2020  . Acute metabolic encephalopathy 37/94/3276  . PAF (paroxysmal atrial fibrillation) ([place]) 07/05/2020  . Anticoagulated 07/05/2020  . NICM (nonischemic cardiomyopathy) ([place]) 07/05/2020  .  History of melanoma 07/05/2020   PCP:  Lajean Manes, MD Pharmacy:   [place], [place] - [address] [place] [address] Phone: [phone] Fax: [phone]  [facility] [place], [place] [address] [place] [address] Phone: [phone] Fax: [phone], [place] (New Address) - [place], [place] AT Previously: [address], [address] [place] [address] Phone: [phone] Fax: [phone]     Social Determinants of Health (SDOH) Interventions    Readmission Risk Interventions No flowsheet data found.

## 2020-11-06 NOTE — Plan of Care (Signed)
  Problem: Education: Goal: Knowledge of disease or condition will improve Outcome: Progressing Goal: Knowledge of secondary prevention will improve Outcome: Progressing Goal: Knowledge of patient specific risk factors addressed and post discharge goals established will improve Outcome: Progressing   Problem: Coping: Goal: Will verbalize positive feelings about self Outcome: Progressing Goal: Will identify appropriate support needs Outcome: Progressing   Problem: Health Behavior/Discharge Planning: Goal: Ability to manage health-related needs will improve Outcome: Progressing   Problem: Education: Goal: Knowledge of General Education information will improve Description: Including pain rating scale, medication(s)/side effects and non-pharmacologic comfort measures Outcome: Progressing   Problem: Health Behavior/Discharge Planning: Goal: Ability to manage health-related needs will improve Outcome: Progressing   Problem: Clinical Measurements: Goal: Ability to maintain clinical measurements within normal limits will improve Outcome: Progressing Goal: Will remain free from infection Outcome: Progressing Goal: Diagnostic test results will improve Outcome: Progressing Goal: Respiratory complications will improve Outcome: Progressing Goal: Cardiovascular complication will be avoided Outcome: Progressing   Problem: Activity: Goal: Risk for activity intolerance will decrease Outcome: Progressing   Problem: Nutrition: Goal: Adequate nutrition will be maintained Outcome: Progressing   Problem: Coping: Goal: Level of anxiety will decrease Outcome: Progressing   Problem: Elimination: Goal: Will not experience complications related to bowel motility Outcome: Progressing   Problem: Pain Managment: Goal: General experience of comfort will improve Outcome: Progressing   Problem: Safety: Goal: Ability to remain free from injury will improve Outcome: Progressing    Problem: Skin Integrity: Goal: Risk for impaired skin integrity will decrease Outcome: Progressing   Problem: Education: Goal: Knowledge of risk factors and measures for prevention of condition will improve Outcome: Progressing   Problem: Coping: Goal: Psychosocial and spiritual needs will be supported Outcome: Progressing   Problem: Respiratory: Goal: Will maintain a patent airway Outcome: Progressing Goal: Complications related to the disease process, condition or treatment will be avoided or minimized Outcome: Progressing   Problem: Education: Goal: Knowledge of secondary prevention will improve Outcome: Progressing

## 2020-11-06 NOTE — Progress Notes (Signed)
PT Cancellation Note  Patient Details Name: Raymond Hurley. MRN: [id number] DOB: 01-29-1931   Cancelled Treatment:    Reason Eval/Treat Not Completed: (P) Patient at procedure or test/unavailable Pt is off floor to IR for HD port placement. PT will follow back tomorrow for treatment.   Emmarie Sannes B. Migdalia Dk PT, DPT Acute Rehabilitation Services Pager [phone] Office [phone]    [place] 11/06/2020, 3:12 PM

## 2020-11-06 NOTE — Progress Notes (Signed)
SLP Cancellation Note  Patient Details Name: Raymond Hurley. MRN: [id number] DOB: Aug 01, 1931   Cancelled treatment:       Reason Eval/Treat Not Completed: Patient at procedure or test/unavailable, Pt NPO for IR procedure.    Pape Parson, Katherene Ponto 11/06/2020, 10:18 AM

## 2020-11-06 NOTE — Progress Notes (Addendum)
Nutrition Follow-up  DOCUMENTATION CODES:   Not applicable  INTERVENTION:  Continue Nepro Shake po TID, each supplement provides 425 kcal and 19 grams protein  Encourage adequate PO intake.   NUTRITION DIAGNOSIS:   Inadequate oral intake related to acute illness as evidenced by NPO status; ongoing  GOAL:   Patient will meet greater than or equal to 90% of their needs; progressing  MONITOR:   PO intake,Supplement acceptance,Skin,Weight trends,Labs,I & O's  REASON FOR ASSESSMENT:   Rounds (CRRT, plan for Cortrak)    ASSESSMENT:   87 yo male admitted with acute diarrhea on 1/1 with diagnosis of COVID and acute dehydration and subsequently found to have aspiration pneumonia. 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), B12 deficiency, systolic cardiomyopathy  1/4- CRRT initiated 1/5- cortrak placed (tip of tube in stomach), TF initiated 1/6- s/p BSE- advanced to dysphagia 3 diet with nectar thick liquids 1/7- CT of abdomen of pelvis revealed large amount ofinflammatory changes arounf the pancreas concerning for severe acute pancreatitis 1/12- CRRT stopped, transition to [facility] 1/15 - NGT removed, TF discontinued 1/24 - Placement of right IJ tunneled catheter  Diet has just been advanced post procedure. Meal completion has been 50-75%. Pt currently has Nepro shake ordered and has been consuming them. RD to continue with current orders to aid in caloric and protein needs. Labs and medications reviewed.   Diet Order:   Diet Order            Diet renal with fluid restriction Fluid restriction: 1200 mL Fluid; Room service appropriate? Yes; Fluid consistency: Thin  Diet effective now                 EDUCATION NEEDS:   Not appropriate for education at this time  Skin:  Skin Assessment: Skin Integrity Issues: Skin Integrity Issues:: Other (Comment) Other: pressure injury buttocks  Last BM:  1/24  Height:   Ht Readings from Last 1  Encounters:  10/28/20 5\' 8"  (1.727 m)    Weight:   Wt Readings from Last 1 Encounters:  11/05/20 98.5 kg   BMI:  Body mass index is 33.02 kg/m.  Estimated Nutritional Needs:   Kcal:  1900-2300 kcals  Protein:  115-140 g  Fluid:  >/= 2L  Corrin Parker, MS, RD, LDN RD pager number/after hours weekend pager number on Amion.

## 2020-11-06 NOTE — NC FL2 (Signed)
[place] LEVEL OF CARE SCREENING TOOL     IDENTIFICATION  Patient Name: Raymond Hurley. Birthdate: 1931/07/16 Sex: male Admission Date (Current Location): 10/14/2020  [facility] and [place] Number:  Herbalist and Address:  [facility] [place], [place] [address], [place], [place] [address]      Provider Number: [id number]  Attending Physician Name and Address:  Jonnie Finner, DO  Relative Name and Phone Number:  Ramone Gander [phone]    Current Level of Care: Hospital Recommended Level of Care: [place] Prior Approval Number:    Date Approved/Denied:   PASRR Number: [id number]  Discharge Plan: SNF    Current Diagnoses: Patient Active Problem List   Diagnosis Date Noted  . Acute respiratory failure ([place])   . AKI (acute kidney injury) ([place])   . Dehydration   . Persistent atrial fibrillation ([place])   . Severe sepsis ([place]) 10/14/2020  . Pneumonia due to COVID-19 virus 10/14/2020  . Chronic systolic CHF (congestive heart failure) ([place]) 10/14/2020  . Elevated troponin 10/14/2020  . Facial weakness 10/14/2020  . Hypocalcemia 10/14/2020  . Macrocytic anemia 10/14/2020  . Acute metabolic encephalopathy 22/11/5425  . PAF (paroxysmal atrial fibrillation) ([place]) 07/05/2020  . Anticoagulated 07/05/2020  . NICM (nonischemic cardiomyopathy) ([place]) 07/05/2020  . History of melanoma 07/05/2020    [place] RESPIRATION BLADDER Height & Weight     Self,Time,Situation,Place  Normal Continent Weight: 217 lb 2.5 oz (98.5 kg) Height:  5\' 8"  (172.7 cm)  BEHAVIORAL SYMPTOMS/MOOD NEUROLOGICAL BOWEL NUTRITION STATUS      Incontinent Diet (See DC summary)  AMBULATORY STATUS COMMUNICATION OF NEEDS Skin   Total Care Verbally PU Stage and Appropriate Care,Skin abrasions (PU R buttocks and Sacrum w/ foam dressing, Face abrasion)                       Personal Care Assistance Level of Assistance  Bathing,Feeding,Dressing  Bathing Assistance: Maximum assistance Feeding assistance: Limited assistance Dressing Assistance: Maximum assistance     Functional Limitations Info  Sight,Hearing,Speech Sight Info: Adequate Hearing Info: Adequate Speech Info: Adequate    SPECIAL CARE FACTORS FREQUENCY  PT (By licensed PT),OT (By licensed OT)     PT Frequency: 5x week OT Frequency: 5x week            Contractures Contractures Info: Not present    Additional Factors Info  Code Status,Allergies,Insulin Sliding Scale Code Status Info: DNR Allergies Info: Povidone-iodine, Tape, Bee Venom, Covid-19 Mrna Vacc (moderna)   Insulin Sliding Scale Info: Insulin Aspart (Novolog) 3-9 U every 4 hours       Current Medications (11/06/2020):  This is the current hospital active medication list Current Facility-Administered Medications  Medication Dose Route Frequency Provider Last Rate Last Admin  . 0.9 %  sodium chloride infusion (Manually program via Guardrails IV Fluids)   Intravenous Once Little Ishikawa, MD      . 0.9 %  sodium chloride infusion (Manually program via Guardrails IV Fluids)   Intravenous Once Mansy, Jan A, MD      . 0.9 %  sodium chloride infusion   Intravenous PRN Icard, Bradley L, DO   Stopped at 10/27/20 0931  . acetaminophen (TYLENOL) 160 MG/5ML solution 650 mg  650 mg Per Tube Q6H Little Ishikawa, MD   650 mg at 11/06/20 1708  . amiodarone (PACERONE) tablet 400 mg  400 mg Per Tube BID Little Ishikawa, MD  400 mg at 11/06/20 1200  . B-complex with vitamin C tablet 1 tablet  1 tablet Per Tube Daily Little Ishikawa, MD   1 tablet at 11/05/20 1040  . chlorhexidine (HIBICLENS) 4 % liquid           . Chlorhexidine Gluconate Cloth 2 % PADS 6 each  6 each Topical Q1200 Mannam, Praveen, MD   6 each at 11/05/20 1041  . Chlorhexidine Gluconate Cloth 2 % PADS 6 each  6 each Topical Q0600 Rosita Fire, MD   6 each at 11/06/20 [phone]  . chlorpheniramine-HYDROcodone (TUSSIONEX) 10-8  MG/5ML suspension 5 mL  5 mL Per Tube Q12H PRN Mannam, Praveen, MD      . Darbepoetin Alfa (ARANESP) injection 100 mcg  100 mcg Intravenous Q Mon-HD Justin Mend, MD      . Derrill Memo ON 11/18/2020] Darbepoetin Alfa (ARANESP) injection 60 mcg  60 mcg Intravenous Q Sat-HD Rosita Fire, MD      . dextrose 50 % solution 50 mL  1 ampule Intravenous Once Mansy, Jan A, MD      . feeding supplement (NEPRO CARB STEADY) liquid 237 mL  237 mL Oral TID BM Little Ishikawa, MD   Held at 11/06/20 [phone]  . fentaNYL (SUBLIMAZE) 100 MCG/2ML injection           . fentaNYL (SUBLIMAZE) injection 25 mcg  25 mcg Intravenous Q2H PRN Mannam, Praveen, MD   25 mcg at 10/26/20 0415  . gelatin adsorbable (GELFOAM/SURGIFOAM) 12-7 MM sponge 12-7 mm           . Gerhardt's butt cream   Topical TID Mannam, Praveen, MD   1 application at 25/42/70 1430  . heparin sodium (porcine) 1000 UNIT/ML injection           . insulin aspart (novoLOG) injection 3-9 Units  3-9 Units Subcutaneous Q4H Mannam, Praveen, MD   3 Units at 11/05/20 2029  . lidocaine (LIDODERM) 5 % 1 patch  1 patch Transdermal Q24H Little Ishikawa, MD   1 patch at 11/06/20 1709  . lidocaine (XYLOCAINE) 1 % (with pres) injection           . MEDLINE mouth rinse  15 mL Mouth Rinse BID Mannam, Praveen, MD   15 mL at 11/06/20 0937  . midazolam (VERSED) 2 MG/2ML injection           . pantoprazole (PROTONIX) EC tablet 40 mg  40 mg Oral Daily Kyle, Tyrone A, DO   40 mg at 11/05/20 6237  . phenol (CHLORASEPTIC) mouth spray 1 spray  1 spray Mouth/Throat Once Mannam, Praveen, MD      . predniSONE (DELTASONE) tablet 40 mg  40 mg Per Tube Q breakfast Little Ishikawa, MD   40 mg at 11/05/20 1044  . Resource ThickenUp Clear   Per Tube PRN Little Ishikawa, MD      . sodium chloride flush (NS) 0.9 % injection 10-40 mL  10-40 mL Intracatheter Q12H Mannam, Praveen, MD   10 mL at 11/05/20 2244  . sodium chloride flush (NS) 0.9 % injection 10-40 mL  10-40 mL  Intracatheter PRN Mannam, Praveen, MD   10 mL at 11/06/20 0322  . sodium chloride flush (NS) 0.9 % injection 3 mL  3 mL Intravenous Q12H Mannam, Praveen, MD   3 mL at 11/06/20 0938  . triamcinolone 0.1 % cream : eucerin cream, 1:1   Topical BID Norval Morton, MD   1 application  at 11/06/20 6979     Discharge Medications: Please see discharge summary for a list of discharge medications.  Relevant Imaging Results:  Relevant Lab Results:   Additional Information SS# [id number]  Coralee Pesa, LCSWA

## 2020-11-06 NOTE — Progress Notes (Signed)
MEWS yellow due to SBP and LOC. Patient off the floor, on IR.

## 2020-11-06 NOTE — TOC Progression Note (Addendum)
Transition of Care (TOC) - Progression Note    Patient Details  Name: Raymond Hurley. MRN: [id number] Date of Birth: [birth year]  Transition of Care [facility]) CM/SW Contact  Coralee Pesa, [place] Phone Number: 11/06/2020, 2:51 PM  Clinical Narrative:     CSW was notified that dtr had questions about next steps. CSW left voicemail and will continue to follow. Pt is currently in procedure, unable to contact.       Expected Discharge Plan and Services                                                 Social Determinants of Health (SDOH) Interventions    Readmission Risk Interventions No flowsheet data found.

## 2020-11-06 NOTE — Progress Notes (Signed)
[facility] Progress Note     Assessment/ Plan:   1. Dialysis dependent AKI 2/2 ATN. 02/2020 Cr 1.2. 1. CRRT 1/4-1/12. And now tol iHD on MWF COVID shift 2. Patient/family desire ongoing dialysis after discussion on 1/20 3. IR to place tunneled dialysis catheter, on 1/24 4. Began clip process as AKI, SW aware 5. Status post HD on 1/21 with 1.5 L UF, tolerated well.  Next HD on 1/24, ordered but may need to be done 11/07/20 pending schedule.  Need to coordinate with IR procedure.  2. Acute pancreatitis: Noted on CT scan 10/16/20 and again on 1/7. 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  3. Acute hypoxemic RF: likely aspiration PNA +/- COVID pneumonitis, now on RA. S/p speroids.  Resolved  4. Shock: high WBC count, w/u per primary team. Possible ilacus abscess on CT from 1/7. Off levophed 1/11. Mero, zyvox. Slowly improving.     5. COVID-19 infection: vaccinated and boosted- treatment per primary  6. Acute encephalopathy: multifactorial, likely COVID + acute illness.  8.  Chronic systolic CHF: EF 12/17/55 on TTE 60-65% with no WMA.  9.  Afib: hep and amiodarone po  10. Acute anemia: GI losses + AKI/CKD. Received multiple blood transfusion.  He has a history of metastatic melanoma.  On 1/22, Dr. Carolin Sicks discussed with multiple family members via video call in the patient's room.  The family discussed with the patient's oncologist at [facility] who reportedly said no contraindication for erythropoietin to manage anemia, I agree.   Being treated with IV iron and ESA.   11.  Thrombocytopenia:  Initially normal, into 50s now back to 70s.  No heparin with dialysis.  Further w/u per primary.    Subjective:   Feeling ok today but some edema.  No UOP.    Objective:   BP 115/63 (BP Location: Right Arm)   Pulse 83   Temp (!) 97.5 F (36.4 C) (Oral)   Resp (!) 22   Ht 5\' 8"  (1.727 m)   Wt 98.5 kg    SpO2 93%   BMI 33.02 kg/m   Intake/Output Summary (Last 24 hours) at 11/06/2020 1051 Last data filed at 11/05/2020 2143 Gross per 24 hour  Intake 237 ml  Output 1 ml  Net 236 ml   Weight change:   Physical Exam: GEN: Not in distress, comfortable on RA NECK: Supple, no thyromegaly LUNGS: Clear bilateral, no rhonchi CV: RRR, No M/R/G ABD: SNDNT +BS  EXT: 1+  extremity edema ACCESS: RIJ temp  site clean  Imaging: No results found.  Labs: BMET Recent Labs  Lab 11/01/20 0326 11/02/20 0422 11/02/20 1546 11/03/20 0336 11/04/20 0423 11/05/20 0315 11/06/20 0027  NA 133* 137 137 137 136 138 137  K 4.9 4.2 4.5 4.6 4.0 4.3 4.6  CL 96* 99 100 100 97* 101 101  CO2 19* 23 20* 20* 22 20* 17*  GLUCOSE 75 93 180* 89 93 60* 123*  BUN 101* 69* 81* 95* 65* 88* 110*  CREATININE 6.40* 4.98* 5.88* 6.55* 4.78* 6.07* 7.26*  CALCIUM 7.7* 7.6* 7.7* 7.8* 7.8* 7.8* 7.8*  PHOS 11.2* 8.4* 9.2* 10.8* 8.3* 9.8* 10.6*   CBC Recent Labs  Lab 11/03/20 0336 11/03/20 2002 11/04/20 0423 11/05/20 0315 11/06/20 0027  WBC 43.5*  --  46.9* 38.9* 39.3*  HGB 6.5* 8.0* 7.6* 6.6* 8.1*  HCT 20.3* 24.4* 22.4* 19.8* 25.0*  MCV 96.2  --  89.2 90.0  89.3  PLT 52*  --  62* 69* 75*    Medications:    . sodium chloride   Intravenous Once  . sodium chloride   Intravenous Once  . acetaminophen (TYLENOL) oral liquid 160 mg/5 mL  650 mg Per Tube Q6H  . amiodarone  400 mg Per Tube BID  . B-complex with vitamin C  1 tablet Per Tube Daily  . Chlorhexidine Gluconate Cloth  6 each Topical Q1200  . Chlorhexidine Gluconate Cloth  6 each Topical Q0600  . darbepoetin (ARANESP) injection - DIALYSIS  100 mcg Intravenous Q Mon-HD  . [START ON 11/18/2020] darbepoetin (ARANESP) injection - DIALYSIS  60 mcg Intravenous Q Sat-HD  . dextrose  1 ampule Intravenous Once  . feeding supplement (NEPRO CARB STEADY)  237 mL Oral TID BM  . Gerhardt's butt cream   Topical TID  . insulin aspart  3-9 Units Subcutaneous Q4H  .  lidocaine  1 patch Transdermal Q24H  . mouth rinse  15 mL Mouth Rinse BID  . pantoprazole  40 mg Oral Daily  . phenol  1 spray Mouth/Throat Once  . predniSONE  40 mg Per Tube Q breakfast  . sodium chloride flush  10-40 mL Intracatheter Q12H  . sodium chloride flush  3 mL Intravenous Q12H  . triamcinolone 0.1 % cream : eucerin   Topical BID    Justin Mend, MD  11/06/2020, 10:51 AM

## 2020-11-06 NOTE — Progress Notes (Signed)
Renal Navigator following for disposition. Outpatient HD seat for AKI treatment in process, but not yet secured.   Alphonzo Cruise, [place] Renal Navigator [phone]

## 2020-11-06 NOTE — Progress Notes (Signed)
Patient back from IR. Right IJ HD catheter present on assessment; dressing clean, dry, intact. Dialysis floor was called and RN was informed that patient has a new access which is ready to use. Per RN patient's dialysis treatment may be reschedule for tomorrow 11/08/19.

## 2020-11-06 NOTE — Progress Notes (Signed)
Patient going to IR for. Alert and oriented x 4; no acute distress noted, no complaints. VS stable.

## 2020-11-06 NOTE — Progress Notes (Signed)
PROGRESS NOTE    Raymond Hurley.  MPN:[id number] DOB: Jan 27, 1931 DOA: 10/14/2020 PCP: Lajean Manes, MD   Brief Narrative:   87 year old man who is hypotensive in the context of acute diarrhea on 1/1 was admitted w/ dx of COVID and acute dehydration from acute diarrheal illness. Of note he was fully vaccinated and had received his booster. Given worsening delirium, multiple organ failure, and profound hypotension with CT abdomen showing pancreatitis. Given advanced age and multiple organ dysfunction in addition to concern about further clinical decline patient was transitioned to critical care team.  1/24: Hgb is stable. WBC back up. No fevers. Will rpt CT ab/pelvis to access the abscess.   Assessment & Plan: Acute blood loss anemia to ongoing GI bleed requiring multiple transfusions; Concurrent anemia of kidney disease, profound dehydration and acute illness     - Hgb 6.6 1/23; will receive 1 unit pRBCs; multiple transfusions during this stay     - he has had heme-positive stools, but is not a candidate for endoscopy currently given stable labs and high risk for procedure.     - Nephrology starting Epo and iron     - 1/24: Hgb stable, follow   Acute hypoxic respiratory failure 2/2 aspiration PNA, resolved Cannot rule out concurrent/resolving COVID-19 infection      - good sats on RA     - continue ambulation, IS, FV     - continue prednisone 40mg  qday; wean as able     - 1/24: move him to 20mg  prednisone qday tomorrow if stable  Septic shock, resolved Possible iliac abscess     - case d/w IR/CCS on 1/7, no surgery or drain indicated     - Meropenem/linezolid completed - follow for fever curve/symptoms. Will likely need to involve ID if concern for worsening/re-infection given profound antibiotic use.     - 1/24: rpt CT ab/pelvis to reassess abscess  Acute metabolic encephalopathy, multifactorial: In the setting of uremia, septic shock, pancreatitis, anemia and COVID-19  infection; Resolved     - continue supportive care, delirium precautions     - 1/24: mentation is good. Follow  Acute renal failure requiring HD Hypophosphatemia, mild     - Suspect secondary to ATN after recent diarrheal illness and volume depletion in the setting of profound disease, septic shock, COVID infection     - Nephrology following     - CRRT clotted off 10/25/20 - HD MWF ongoing as tolerated     - 1/24: tunneled cath to be placed to day, follow  Acute pancreatitis     - continue pain control, supportive care     - NG tube/feeds removed 10/28/20 - tolerate PO well, passed SLP eval     - Rectal tube removed     - 1/24: white count is back up; etiology?? Getting rpt Ct ab/pelvis  Afib, likely paroxysmal; questionably provoked Systolic cardiomyopathy     - Echo 1/3 with EF 55 - 60%     - In and out of A. fib over the hospitalization      - Heparin drip stopped due to hematochezia 1/16     - continue amiodarone  Goals of care discussion      - At this point family and patient continued to impress upon the medical staff they would like to continue aggressive med care including hemodialysis, blood transfusions and procedures as indicated     - Patient and family have agreed against intubation, CPR; he is  DNR  DVT prophylaxis: SCDs Code Status: DNR Family Communication: None at bedside   Status is: Inpatient  Remains inpatient appropriate because:Inpatient level of care appropriate due to severity of illness   Dispo: The patient is from: Home              Anticipated d/c is to: TBD              Anticipated d/c date is: 3 days              Patient currently is not medically stable to d/c.   Difficult to place patient No  Consultants:   Nephrology  PCCM  GI  Cardiology  Neurology  Palliative Care  Subjective: "I think I did fine last night."  Objective: Vitals:   11/06/20 0324 11/06/20 0742 11/06/20 1148 11/06/20 1210  BP: (!) 105/59 115/63 (!) 90/51  (!) 110/51  Pulse: 68 83 76 80  Resp: 20 (!) 22 17 18   Temp: 97.8 F (36.6 C) (!) 97.5 F (36.4 C) 97.8 F (36.6 C) 97.8 F (36.6 C)  TempSrc: Oral Oral Oral Oral  SpO2: 94% 93% 97% 97%  Weight:      Height:        Intake/Output Summary (Last 24 hours) at 11/06/2020 1259 Last data filed at 11/06/2020 1201 Gross per 24 hour  Intake 297 ml  Output 1 ml  Net 296 ml   Filed Weights   11/03/20 1738 11/04/20 0657 11/05/20 0600  Weight: 97.5 kg 100.4 kg 98.5 kg    Examination:  General: 87 y.o. male resting in bed in NAD Eyes: PERRL, normal sclera ENMT: Nares patent w/o discharge, orophaynx clear, dentition normal, ears w/o discharge/lesions/ulcers Neck: Supple, trachea midline Cardiovascular: RRR, +S1, S2, no m/g/r, equal pulses throughout Respiratory: CTABL, no w/r/r, normal WOB GI: BS+, NDNT, no masses noted, no organomegaly noted MSK: No e/c/c Skin: No rashes, bruises, ulcerations noted Neuro: A&O x 3, no focal deficits Psyc: Appropriate interaction and affect, calm/cooperative   Data Reviewed: I have personally reviewed following labs and imaging studies.  CBC: Recent Labs  Lab 11/03/20 0336 11/03/20 2002 11/04/20 0423 11/05/20 0315 11/06/20 0027 11/06/20 1007  WBC 43.5*  --  46.9* 38.9* 39.3* 46.6*  HGB 6.5* 8.0* 7.6* 6.6* 8.1* 8.4*  HCT 20.3* 24.4* 22.4* 19.8* 25.0* 24.4*  MCV 96.2  --  89.2 90.0 89.3 88.4  PLT 52*  --  62* 69* 75* 84*   Basic Metabolic Panel: Recent Labs  Lab 11/02/20 0422 11/02/20 1546 11/03/20 0336 11/04/20 0423 11/05/20 0315 11/06/20 0027  NA 137 137 137 136 138 137  K 4.2 4.5 4.6 4.0 4.3 4.6  CL 99 100 100 97* 101 101  CO2 23 20* 20* 22 20* 17*  GLUCOSE 93 180* 89 93 60* 123*  BUN 69* 81* 95* 65* 88* 110*  CREATININE 4.98* 5.88* 6.55* 4.78* 6.07* 7.26*  CALCIUM 7.6* 7.7* 7.8* 7.8* 7.8* 7.8*  MG 2.2  --  2.3 2.0 2.1 2.2  PHOS 8.4* 9.2* 10.8* 8.3* 9.8* 10.6*   GFR: Estimated Creatinine Clearance: 8.2 mL/min (A) (by C-G  formula based on SCr of 7.26 mg/dL (H)). Liver Function Tests: Recent Labs  Lab 11/03/20 0336 11/04/20 0423 11/05/20 0315 11/06/20 0027 11/06/20 1007  AST  --   --   --   --  32  ALT  --   --   --   --  25  ALKPHOS  --   --   --   --  125  BILITOT  --   --   --   --  1.1  PROT  --   --   --   --  4.9*  ALBUMIN 1.8* 1.9* 1.7* 1.8* 2.0*   No results for input(s): LIPASE, AMYLASE in the last 168 hours. No results for input(s): AMMONIA in the last 168 hours. Coagulation Profile: No results for input(s): INR, PROTIME in the last 168 hours. Cardiac Enzymes: No results for input(s): CKTOTAL, CKMB, CKMBINDEX, TROPONINI in the last 168 hours. BNP (last 3 results) No results for input(s): PROBNP in the last 8760 hours. HbA1C: No results for input(s): HGBA1C in the last 72 hours. CBG: Recent Labs  Lab 11/05/20 2003 11/06/20 0036 11/06/20 0325 11/06/20 0750 11/06/20 1150  GLUCAP 133* 129* 105* 83 76   Lipid Profile: No results for input(s): CHOL, HDL, LDLCALC, TRIG, CHOLHDL, LDLDIRECT in the last 72 hours. Thyroid Function Tests: No results for input(s): TSH, T4TOTAL, FREET4, T3FREE, THYROIDAB in the last 72 hours. Anemia Panel: Recent Labs    11/04/20 0423  FERRITIN 1,197*  TIBC 223*  IRON 54   Sepsis Labs: No results for input(s): PROCALCITON, LATICACIDVEN in the last 168 hours.  No results found for this or any previous visit (from the past 240 hour(s)).    Radiology Studies: No results found.   Scheduled Meds: . sodium chloride   Intravenous Once  . sodium chloride   Intravenous Once  . acetaminophen (TYLENOL) oral liquid 160 mg/5 mL  650 mg Per Tube Q6H  . amiodarone  400 mg Per Tube BID  . B-complex with vitamin C  1 tablet Per Tube Daily  . Chlorhexidine Gluconate Cloth  6 each Topical Q1200  . Chlorhexidine Gluconate Cloth  6 each Topical Q0600  . darbepoetin (ARANESP) injection - DIALYSIS  100 mcg Intravenous Q Mon-HD  . [START ON 11/18/2020] darbepoetin  (ARANESP) injection - DIALYSIS  60 mcg Intravenous Q Sat-HD  . dextrose  1 ampule Intravenous Once  . feeding supplement (NEPRO CARB STEADY)  237 mL Oral TID BM  . Gerhardt's butt cream   Topical TID  . insulin aspart  3-9 Units Subcutaneous Q4H  . lidocaine  1 patch Transdermal Q24H  . mouth rinse  15 mL Mouth Rinse BID  . pantoprazole  40 mg Oral Daily  . phenol  1 spray Mouth/Throat Once  . predniSONE  40 mg Per Tube Q breakfast  . sodium chloride flush  10-40 mL Intracatheter Q12H  . sodium chloride flush  3 mL Intravenous Q12H  . triamcinolone 0.1 % cream : eucerin   Topical BID   Continuous Infusions: . sodium chloride Stopped (10/27/20 0931)  .  ceFAZolin (ANCEF) IV       LOS: 23 days    Time spent: 25 minutes spent in the coordination of care today.    Jonnie Finner, DO [facility]  If 7PM-7AM, please contact night-coverage [email or web address] 11/06/2020, 12:59 PM

## 2020-11-06 NOTE — Progress Notes (Signed)
Pt has had 4 loose stools since report to night shift. Paged MD to inquire about immodium if an option. MD filed order.

## 2020-11-07 DIAGNOSIS — R6520 Severe sepsis without septic shock: Secondary | ICD-10-CM | POA: Diagnosis not present

## 2020-11-07 DIAGNOSIS — A419 Sepsis, unspecified organism: Secondary | ICD-10-CM | POA: Diagnosis not present

## 2020-11-07 LAB — GLUCOSE, CAPILLARY
Glucose-Capillary: 105 mg/dL — ABNORMAL HIGH (ref 70–99)
Glucose-Capillary: 106 mg/dL — ABNORMAL HIGH (ref 70–99)
Glucose-Capillary: 135 mg/dL — ABNORMAL HIGH (ref 70–99)
Glucose-Capillary: 62 mg/dL — ABNORMAL LOW (ref 70–99)
Glucose-Capillary: 79 mg/dL (ref 70–99)
Glucose-Capillary: 92 mg/dL (ref 70–99)

## 2020-11-07 LAB — COMPREHENSIVE METABOLIC PANEL
ALT: 21 U/L (ref 0–44)
AST: 26 U/L (ref 15–41)
Albumin: 1.7 g/dL — ABNORMAL LOW (ref 3.5–5.0)
Alkaline Phosphatase: 109 U/L (ref 38–126)
Anion gap: 20 — ABNORMAL HIGH (ref 5–15)
BUN: 135 mg/dL — ABNORMAL HIGH (ref 8–23)
CO2: 16 mmol/L — ABNORMAL LOW (ref 22–32)
Calcium: 7.8 mg/dL — ABNORMAL LOW (ref 8.9–10.3)
Chloride: 97 mmol/L — ABNORMAL LOW (ref 98–111)
Creatinine, Ser: 8.52 mg/dL — ABNORMAL HIGH (ref 0.61–1.24)
GFR, Estimated: 6 mL/min — ABNORMAL LOW (ref >60)
Glucose, Bld: 117 mg/dL — ABNORMAL HIGH (ref 70–99)
Potassium: 4.2 mmol/L (ref 3.5–5.1)
Sodium: 133 mmol/L — ABNORMAL LOW (ref 135–145)
Total Bilirubin: 1 mg/dL (ref 0.3–1.2)
Total Protein: 4.5 g/dL — ABNORMAL LOW (ref 6.5–8.1)

## 2020-11-07 LAB — CBC
HCT: 23.6 % — ABNORMAL LOW (ref 39.0–52.0)
Hemoglobin: 7.8 g/dL — ABNORMAL LOW (ref 13.0–17.0)
MCH: 29.5 pg (ref 26.0–34.0)
MCHC: 33.1 g/dL (ref 30.0–36.0)
MCV: 89.4 fL (ref 80.0–100.0)
Platelets: 86 10*3/uL — ABNORMAL LOW (ref 150–400)
RBC: 2.64 MIL/uL — ABNORMAL LOW (ref 4.22–5.81)
RDW: 21 % — ABNORMAL HIGH (ref 11.5–15.5)
WBC: 41.4 10*3/uL — ABNORMAL HIGH (ref 4.0–10.5)
nRBC: 0 % (ref 0.0–0.2)

## 2020-11-07 LAB — MAGNESIUM: Magnesium: 2.3 mg/dL (ref 1.7–2.4)

## 2020-11-07 MED ORDER — LOPERAMIDE HCL 2 MG PO CAPS
2.0000 mg | ORAL_CAPSULE | Freq: Once | ORAL | Status: AC
  Administered 2020-11-07: 2 mg via ORAL
  Filled 2020-11-07: qty 1

## 2020-11-07 MED ORDER — ACETAMINOPHEN 160 MG/5ML PO SOLN
650.0000 mg | Freq: Four times a day (QID) | ORAL | Status: DC
Start: 2020-11-07 — End: 2020-11-10
  Administered 2020-11-07 – 2020-11-10 (×12): 650 mg via ORAL
  Filled 2020-11-07 (×13): qty 20.3

## 2020-11-07 MED ORDER — HEPARIN SODIUM (PORCINE) 1000 UNIT/ML IJ SOLN
INTRAMUSCULAR | Status: AC
  Filled 2020-11-07: qty 1

## 2020-11-07 MED ORDER — ALBUMIN HUMAN 25 % IV SOLN
INTRAVENOUS | Status: AC
  Administered 2020-11-07: 25 g
  Filled 2020-11-07: qty 100

## 2020-11-07 MED ORDER — MIDODRINE HCL 5 MG PO TABS
10.0000 mg | ORAL_TABLET | Freq: Two times a day (BID) | ORAL | Status: AC
  Administered 2020-11-07: 10 mg via ORAL

## 2020-11-07 MED ORDER — DARBEPOETIN ALFA 150 MCG/0.3ML IJ SOSY
PREFILLED_SYRINGE | INTRAMUSCULAR | Status: AC
  Filled 2020-11-07: qty 0.3

## 2020-11-07 MED ORDER — LOPERAMIDE HCL 2 MG PO CAPS
2.0000 mg | ORAL_CAPSULE | ORAL | Status: DC | PRN
  Filled 2020-11-07: qty 1

## 2020-11-07 MED ORDER — DARBEPOETIN ALFA 100 MCG/0.5ML IJ SOSY
100.0000 ug | PREFILLED_SYRINGE | INTRAMUSCULAR | Status: DC
  Filled 2020-11-07: qty 0.5

## 2020-11-07 MED ORDER — MIDODRINE HCL 5 MG PO TABS
ORAL_TABLET | ORAL | Status: AC
  Filled 2020-11-07: qty 2

## 2020-11-07 MED ORDER — DARBEPOETIN ALFA 100 MCG/0.5ML IJ SOSY
PREFILLED_SYRINGE | INTRAMUSCULAR | Status: AC
  Administered 2020-11-07: 100 ug
  Filled 2020-11-07: qty 0.5

## 2020-11-07 MED ORDER — SUCRALFATE 1 GM/10ML PO SUSP
1.0000 g | Freq: Three times a day (TID) | ORAL | Status: DC
  Administered 2020-11-07 – 2020-11-10 (×13): 1 g via ORAL
  Filled 2020-11-07 (×12): qty 10

## 2020-11-07 MED ORDER — BACID PO TABS
2.0000 | ORAL_TABLET | Freq: Three times a day (TID) | ORAL | Status: DC
  Administered 2020-11-07 – 2020-11-10 (×9): 2 via ORAL
  Filled 2020-11-07 (×11): qty 2

## 2020-11-07 NOTE — Progress Notes (Signed)
PROGRESS NOTE    [name].  LKT:[id number] DOB: 1931/04/22 DOA: 10/14/2020 PCP: Lajean Manes, MD   Brief Narrative:   87 year old man who is hypotensive in the context of acute diarrhea on 1/1 was admitted w/ dx of COVID and acute dehydration from acute diarrheal illness. Of note he was fully vaccinated and had received his booster. Given worsening delirium, multiple organ failure, and profound hypotension with CT abdomen showing pancreatitis. Given advanced age and multiple organ dysfunction in addition to concern about further clinical decline patient was transitioned to critical care team.  1/25: c/o diarrhea ON. Received imodium and improved.    Assessment & Plan: Acute blood loss anemia to ongoing GI bleed requiring multiple transfusions; Concurrent anemia of kidney disease, profound dehydration and acute illness - multiple transfusions during this stay - he has had heme-positive stools, but is not a candidate for endoscopy currently given stable labs and high risk for procedure. - Nephrology started Epo and iron - 1/25: Hgb slightly down to 7.8 today; follow   Acute hypoxic respiratory failure 2/2 aspiration PNA, resolved Cannot rule out concurrent/resolving COVID-19 infection  - good sats on RA - continue ambulation, IS, FV - continue prednisone 81m qday; wean as able     - 1/25: resp status is good. Has borderline BP today. Give small amount of fluids. Keep steroids at current dosing today  Diarrhea     - murky picture here; 4 episodes last night, improved with imodium     - he has persistently elevated WBC w/o fever     - hold protonix, add carafate; can continue imodium PRN for now     - if fever develops, check c diff as he has had recent heavy abx     - add probiotic  Septic shock, resolved Possible iliac abscess - case d/w IR/CCS on 1/7, no surgery or drain indicated - Meropenem/linezolid completed - follow for  fever curve/symptoms. Will likely need to involve ID if concern for worsening/re-infection given profound antibiotic use.     - 1/25: rpt CT shows stable thickening of the right iliacus muscle, but increased thickening of the rigth psoas muscle; no definitive abscess seen; follow for now  Acute metabolic encephalopathy, multifactorial: In the setting of uremia, septic shock, pancreatitis, anemia and COVID-19 infection; Resolved - continue supportive care, delirium precautions     - resolved  Acute renal failure requiring HD Hypophosphatemia, mild - Suspect secondary to ATN after recent diarrheal illness and volume depletion in the setting of profound disease, septic shock, COVID infection - Nephrology following - CRRT clotted off 10/25/20 - HD MWF ongoing as tolerated     - 1/25: s/p TDC placement; nephro working out Outpt HD spot  Severe acute pancreatitis - continue pain control, supportive care - NG tube/feeds removed 10/28/20 - tolerate PO well, passed SLP eval - Rectal tube removed     - 1/25: Rpt CT ab/pelvis shows possible early pseudocyst formation     - previously eval'd by GI (10/16/20) and rec'd MRCP w/ contrast after acute kidney injury has resolved; 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 fib over the hospitalization  - Heparin drip stopped due to hematochezia 1/16 - continue amiodarone  Goals of care discussion  - At this point family and patient continued to impress  upon the medical staff they would like to continue aggressive med care including hemodialysis, blood transfusions and procedures as indicated - Patient and family have agreed against intubation, CPR; he is DNR  DVT prophylaxis: SCDs Code Status: DNR Family Communication:  None at bedside.   Status is: Inpatient  Remains inpatient appropriate because:Inpatient level of care appropriate due to severity of illness   Dispo: The patient is from: Home              Anticipated d/c is to: TBD              Anticipated d/c date is: 3 days              Patient currently is not medically stable to d/c.   Difficult to place patient No  Consultants:   Nephrology  PCCM  GI  Cardiology  Neurology  Palliative Care  Subjective: "My bottom is raw."  Objective: Vitals:   11/06/20 1840 11/06/20 1945 11/06/20 2350 11/07/20 0340  BP: (!) 102/52 (!) 104/46 (!) 88/49 (!) 94/50  Pulse: 77 78 81 77  Resp: 19 (!) '21 16 16  ' Temp: 98 F (36.7 C) 98 F (36.7 C) 98 F (36.7 C) 98 F (36.7 C)  TempSrc: Oral Axillary Axillary Axillary  SpO2: 95% 96% 97% 94%  Weight:    96.5 kg  Height:        Intake/Output Summary (Last 24 hours) at 11/07/2020 3335 Last data filed at 11/06/2020 1710 Gross per 24 hour  Intake 120 ml  Output -  Net 120 ml   Filed Weights   11/04/20 0657 11/05/20 0600 11/07/20 0340  Weight: 100.4 kg 98.5 kg 96.5 kg    Examination:  General: 87 y.o. male resting in bed in NAD Eyes: PERRL, normal sclera ENMT: Nares patent w/o discharge, orophaynx clear, dentition normal, ears w/o discharge/lesions/ulcers Neck: Supple, trachea midline Cardiovascular: RRR, +S1, S2, no m/g/r, equal pulses throughout Respiratory: CTABL, no w/r/r, normal WOB GI: BS+, NDNT, no masses noted, no organomegaly noted MSK: No e/c/c Skin: No rashes, bruises, ulcerations noted Neuro: A&O x 3, no focal deficits Psyc: Appropriate interaction and affect, calm/cooperative   Data Reviewed: I have personally reviewed following labs and imaging studies.  CBC: Recent Labs  Lab 11/04/20 0423 11/05/20 0315 11/06/20 0027 11/06/20 1007 11/07/20 0200  WBC 46.9* 38.9* 39.3* 46.6* 41.4*  HGB 7.6* 6.6* 8.1* 8.4* 7.8*  HCT 22.4* 19.8* 25.0* 24.4* 23.6*  MCV 89.2 90.0  89.3 88.4 89.4  PLT 62* 69* 75* 84* 86*   Basic Metabolic Panel: Recent Labs  Lab 11/02/20 1546 11/03/20 0336 11/04/20 0423 11/05/20 0315 11/06/20 0027 11/07/20 0200  NA 137 137 136 138 137 133*  K 4.5 4.6 4.0 4.3 4.6 4.2  CL 100 100 97* 101 101 97*  CO2 20* 20* 22 20* 17* 16*  GLUCOSE 180* 89 93 60* 123* 117*  BUN 81* 95* 65* 88* 110* 135*  CREATININE 5.88* 6.55* 4.78* 6.07* 7.26* 8.52*  CALCIUM 7.7* 7.8* 7.8* 7.8* 7.8* 7.8*  MG  --  2.3 2.0 2.1 2.2 2.3  PHOS 9.2* 10.8* 8.3* 9.8* 10.6*  --    GFR: Estimated Creatinine Clearance: 6.9 mL/min (A) (by C-G formula based on SCr of 8.52 mg/dL (H)). Liver Function Tests: Recent Labs  Lab 11/04/20 0423 11/05/20 0315 11/06/20 0027 11/06/20 1007 11/07/20 0200  AST  --   --   --  32 26  ALT  --   --   --  25 21  ALKPHOS  --   --   --  125 109  BILITOT  --   --   --  1.1 1.0  PROT  --   --   --  4.9* 4.5*  ALBUMIN 1.9* 1.7* 1.8* 2.0* 1.7*   No results for input(s): LIPASE, AMYLASE in the last 168 hours. No results for input(s): AMMONIA in the last 168 hours. Coagulation Profile: No results for input(s): INR, PROTIME in the last 168 hours. Cardiac Enzymes: No results for input(s): CKTOTAL, CKMB, CKMBINDEX, TROPONINI in the last 168 hours. BNP (last 3 results) No results for input(s): PROBNP in the last 8760 hours. HbA1C: No results for input(s): HGBA1C in the last 72 hours. CBG: Recent Labs  Lab 11/06/20 1150 11/06/20 1655 11/06/20 1946 11/06/20 2351 11/07/20 0341  GLUCAP 76 85 93 127* 92   Lipid Profile: No results for input(s): CHOL, HDL, LDLCALC, TRIG, CHOLHDL, LDLDIRECT in the last 72 hours. Thyroid Function Tests: No results for input(s): TSH, T4TOTAL, FREET4, T3FREE, THYROIDAB in the last 72 hours. Anemia Panel: No results for input(s): VITAMINB12, FOLATE, FERRITIN, TIBC, IRON, RETICCTPCT in the last 72 hours. Sepsis Labs: No results for input(s): PROCALCITON, LATICACIDVEN in the last 168 hours.  No  results found for this or any previous visit (from the past 240 hour(s)).    Radiology Studies: CT ABDOMEN PELVIS WO CONTRAST  Result Date: 11/06/2020 CLINICAL DATA:  Follow-up iliac abscess.  Acute pancreatitis. EXAM: CT ABDOMEN AND PELVIS WITHOUT CONTRAST TECHNIQUE: Multidetector CT imaging of the abdomen and pelvis was performed following the standard protocol without IV contrast. COMPARISON:  CT 10/20/2020 FINDINGS: Lower chest: Lung bases are clear. Hepatobiliary: No focal hepatic lesion. Gallstone within collapsed gallbladder. Pancreas: Exuberant inflammatory response about the pancreas. Inflammatory response difficult to assess without IV contrast or oral on contrast. Potential organized fluid collection measuring 6.4 by 4.0 cm between the pancreas and the body the stomach (image 32/3. Inflammatory nodularity extends into the greater omentum (image 45/3) Spleen: Normal spleen Adrenals/urinary tract: Adrenal glands normal. No obstructive uropathy. Bladder normal. Stomach/Bowel: Stomach and duodenum are grossly normal. No bowel obstruction. Bowel is decompressed. Ascending and transverse colon normal. Multiple diverticula of the descending colon sigmoid colon. Rectum normal Vascular/Lymphatic: Abdominal aorta is normal caliber with atherosclerotic calcification. There is no retroperitoneal or periportal lymphadenopathy. No pelvic lymphadenopathy. Reproductive: Prostate unremarkable Other: Thickening of the RIGHT iliacus muscle measuring 6.9 x 3.2 cm (image 68/6). This compares to 7.2 x 3.1 cm on CT 04/29/2021. Visually lesion appears very similar. However, the RIGHT psoas muscle measures 4.4 cm (image 53/7) increased from 3.2 cm Musculoskeletal: No evidence of osteomyelitis or discitis. IMPRESSION: 1. Severe acute pancreatitis. Probable early pseudocyst formation between the pancreas and stomach. 2. Thickened RIGHT iliacus muscle is unchanged. Increased thickness of the RIGHT psoas muscle. Electronically  Signed   By: Suzy Bouchard M.D.   On: 11/06/2020 15:08   IR Fluoro Guide CV Line Right  Result Date: 11/06/2020 INDICATION: 87 year old male referred for tunneled hemodialysis catheter EXAM: TUNNELED CENTRAL VENOUS HEMODIALYSIS CATHETER PLACEMENT WITH ULTRASOUND AND FLUOROSCOPIC GUIDANCE MEDICATIONS: None ANESTHESIA/SEDATION: Moderate (conscious) sedation was employed during this procedure. A total of Versed 1.0 mg and Fentanyl 25 mcg was administered intravenously. Moderate Sedation Time: 12 minutes. The patient's level of consciousness and vital signs were monitored continuously by radiology nursing throughout the procedure under my direct supervision. FLUOROSCOPY TIME:  Fluoroscopy Time: 0 minutes 12 seconds (1 mGy). COMPLICATIONS: None PROCEDURE: Informed written consent was obtained  from the patient after a discussion of the risks, benefits, and alternatives to treatment. Questions regarding the procedure were encouraged and answered. The right neck and chest were prepped with chlorhexidine in a sterile fashion, and a sterile drape was applied covering the operative field. Maximum barrier sterile technique with sterile gowns and gloves were used for the procedure. A timeout was performed prior to the initiation of the procedure. Ultrasound survey was performed. Micropuncture kit was utilized to access the right internal jugular vein under direct, real-time ultrasound guidance after the overlying soft tissues were anesthetized with 1% lidocaine with epinephrine. Stab incision was made with 11 blade scalpel. Microwire was passed centrally. The microwire was then marked to measure appropriate internal catheter length. External tunneled length was estimated. A total tip to cuff length of 19 cm was selected. 035 guidewire was advanced to the level of the IVC. Skin and subcutaneous tissues of chest wall below the clavicle were generously infiltrated with 1% lidocaine for local anesthesia. A small stab incision  was made with 11 blade scalpel. The selected hemodialysis catheter was tunneled in a retrograde fashion from the anterior chest wall to the venotomy incision. Serial dilation was performed and then a peel-away sheath was placed. The catheter was then placed through the peel-away sheath with tips ultimately positioned within the superior aspect of the right atrium. Final catheter positioning was confirmed and documented with a spot radiographic image. The catheter aspirates and flushes normally. The catheter was flushed with appropriate volume heparin dwells. The catheter exit site was secured with a 0-Prolene retention suture. Gel-Foam slurry was infused into the soft tissue tract. The venotomy incision was closed Derma bond and sterile dressing. Dressings were applied at the chest wall. Patient tolerated the procedure well and remained hemodynamically stable throughout. No complications were encountered and no significant blood loss encountered. IMPRESSION: Status post right IJ tunneled hemodialysis catheter. Signed, Dulcy Fanny. Dellia Nims, RPVI [facility] Radiology Electronically Signed   By: Corrie Mckusick D.O.   On: 11/06/2020 16:58   IR US Guide Vasc Access Right  Result Date: 11/06/2020 INDICATION: 87 year old male referred for tunneled hemodialysis catheter EXAM: TUNNELED CENTRAL VENOUS HEMODIALYSIS CATHETER PLACEMENT WITH ULTRASOUND AND FLUOROSCOPIC GUIDANCE MEDICATIONS: None ANESTHESIA/SEDATION: Moderate (conscious) sedation was employed during this procedure. A total of Versed 1.0 mg and Fentanyl 25 mcg was administered intravenously. Moderate Sedation Time: 12 minutes. The patient's level of consciousness and vital signs were monitored continuously by radiology nursing throughout the procedure under my direct supervision. FLUOROSCOPY TIME:  Fluoroscopy Time: 0 minutes 12 seconds (1 mGy). COMPLICATIONS: None PROCEDURE: Informed written consent was obtained  from the patient after a discussion of the risks, benefits, and alternatives to treatment. Questions regarding the procedure were encouraged and answered. The right neck and chest were prepped with chlorhexidine in a sterile fashion, and a sterile drape was applied covering the operative field. Maximum barrier sterile technique with sterile gowns and gloves were used for the procedure. A timeout was performed prior to the initiation of the procedure. Ultrasound survey was performed. Micropuncture kit was utilized to access the right internal jugular vein under direct, real-time ultrasound guidance after the overlying soft tissues were anesthetized with 1% lidocaine with epinephrine. Stab incision was made with 11 blade scalpel. Microwire was passed centrally. The microwire was then marked to measure appropriate internal catheter length. External tunneled length was estimated. A total tip to cuff length of 19 cm was selected. 035 guidewire was advanced to the  level of the IVC. Skin and subcutaneous tissues of chest wall below the clavicle were generously infiltrated with 1% lidocaine for local anesthesia. A small stab incision was made with 11 blade scalpel. The selected hemodialysis catheter was tunneled in a retrograde fashion from the anterior chest wall to the venotomy incision. Serial dilation was performed and then a peel-away sheath was placed. The catheter was then placed through the peel-away sheath with tips ultimately positioned within the superior aspect of the right atrium. Final catheter positioning was confirmed and documented with a spot radiographic image. The catheter aspirates and flushes normally. The catheter was flushed with appropriate volume heparin dwells. The catheter exit site was secured with a 0-Prolene retention suture. Gel-Foam slurry was infused into the soft tissue tract. The venotomy incision was closed Derma bond and sterile dressing. Dressings were applied at the chest wall.  Patient tolerated the procedure well and remained hemodynamically stable throughout. No complications were encountered and no significant blood loss encountered. IMPRESSION: Status post right IJ tunneled hemodialysis catheter. Signed, Dulcy Fanny. Dellia Nims, RPVI [facility] Radiology Electronically Signed   By: Corrie Mckusick D.O.   On: 11/06/2020 16:58     Scheduled Meds: . sodium chloride   Intravenous Once  . sodium chloride   Intravenous Once  . acetaminophen (TYLENOL) oral liquid 160 mg/5 mL  650 mg Oral Q6H  . amiodarone  400 mg Per Tube BID  . B-complex with vitamin C  1 tablet Per Tube Daily  . Chlorhexidine Gluconate Cloth  6 each Topical Q1200  . Chlorhexidine Gluconate Cloth  6 each Topical Q0600  . darbepoetin (ARANESP) injection - DIALYSIS  100 mcg Intravenous Q Mon-HD  . [START ON 11/18/2020] darbepoetin (ARANESP) injection - DIALYSIS  60 mcg Intravenous Q Sat-HD  . dextrose  1 ampule Intravenous Once  . feeding supplement (NEPRO CARB STEADY)  237 mL Oral TID BM  . Gerhardt's butt cream   Topical TID  . insulin aspart  3-9 Units Subcutaneous Q4H  . lidocaine  1 patch Transdermal Q24H  . mouth rinse  15 mL Mouth Rinse BID  . pantoprazole  40 mg Oral Daily  . phenol  1 spray Mouth/Throat Once  . predniSONE  40 mg Per Tube Q breakfast  . sodium chloride flush  10-40 mL Intracatheter Q12H  . sodium chloride flush  3 mL Intravenous Q12H  . triamcinolone 0.1 % cream : eucerin   Topical BID   Continuous Infusions: . sodium chloride Stopped (10/27/20 0931)     LOS: 24 days    Time spent: 25 minutes spent in the coordination of care today.   Jonnie Finner, DO [facility]  If 7PM-7AM, please contact night-coverage [email or web address] 11/07/2020, 7:13 AM

## 2020-11-07 NOTE — Consult Note (Signed)
   [facility] CM Inpatient Consult   11/07/2020  Raymond Hurley 09-May-1931 [id number]   [place] Organization [ACO] Patient: Medicare  Patient screened for length of stay [LLOS], high score for unplanned readmission risk and for hospitalization to check for  potential [facility] service needs.  Review of patient's medical record reveals patient is currently recommended for a skilled nursing facility for short term stay.   Primary Care Provider is Dr. Lajean Manes this provider is listed to provide the transition of care [TOC] for post hospital follow up.   Plan:  If patient transitions to a [facility] affiliated facility this writer will alert the [facility] RN of TOC for post facility follow up needs.  For questions contact:   Natividad Brood, RN BSN [facility] Liaison  [phone] business mobile phone Toll free office [phone]  Fax number: [phone] [email or web address]

## 2020-11-07 NOTE — Progress Notes (Signed)
Report given to [address] [place], RN for pt to transfer to [place], room 24.

## 2020-11-07 NOTE — Progress Notes (Signed)
SLP Cancellation Note  Patient Details Name: Raymond Hurley. MRN: [id number] DOB: 03-Mar-1931   Cancelled treatment:       Reason Eval/Treat Not Completed: Patient at procedure or test/unavailable (Pt off unit for HD. SLP will follow up on subsequent date.)  Rejoice Heatwole I. Hardin Negus, [place], [place] Office number [phone] Pager [place] 11/07/2020, 2:21 PM

## 2020-11-07 NOTE — Progress Notes (Signed)
OT Cancellation Note  Patient Details Name: Raymond Hurley. MRN: [id number] DOB: Mar 13, 1931   Cancelled Treatment:    Reason Eval/Treat Not Completed: Patient at procedure or test/ unavailable (HD), will follow up as able.  Lou Cal, OT Acute Rehabilitation Services Pager [phone] Office [phone]   [name] 11/07/2020, 2:26 PM

## 2020-11-07 NOTE — Progress Notes (Signed)
Inpatient Diabetes Program Recommendations  AACE/ADA: New Consensus Statement on Inpatient Glycemic Control (2015)  Target Ranges:  Prepandial:   less than 140 mg/dL      Peak postprandial:   less than 180 mg/dL (1-2 hours)      Critically ill patients:  140 - 180 mg/dL   Lab Results  Component Value Date   GLUCAP 62 (L) 11/07/2020   HGBA1C 5.7 (H) 10/14/2020    Review of Glycemic Control Results for Raymond Hurley, Raymond Hurley (MRN [id number]) as of 11/07/2020 11:33  Ref. Range 11/06/2020 23:51 11/07/2020 03:41 11/07/2020 07:35  Glucose-Capillary Latest Ref Range: 70 - 99 mg/dL 127 (H) 92 62 (L)    Current orders for Inpatient glycemic control: Novolog 3-9 units Q4H Prednisone 40mg  QAM  Inpatient Diabetes Program Recommendations:    Noted hypoglycemia following Novolog correction.   Consider reducing to Novolog 0-6 units TID now that patient has diet order.   Thanks, Bronson Curb, MSN, RNC-OB Diabetes Coordinator [phone] (8a-5p)

## 2020-11-07 NOTE — Progress Notes (Signed)
PT Cancellation Note  Patient Details Name: Raymond Hurley. MRN: [id number] DOB: 1931-07-12   Cancelled Treatment:    Reason Eval/Treat Not Completed: Other (comment) Attempted multiple times, in am pt on bed pan and then family phone call per RN.  Then in pm pt in HD.  Will f/u as able.  Abran Richard, PT Acute Rehab Services Pager [phone] [facility] [place] 11/07/2020, 4:45 PM

## 2020-11-07 NOTE — Progress Notes (Signed)
[facility] Progress Note     Assessment/ Plan:   1. Dialysis dependent AKI 2/2 ATN. 02/2020 Cr 1.2. 1. CRRT 1/4-1/12. And now tol iHD on MWF COVID shift 2. Patient/family desire ongoing dialysis after discussion on 1/20 3. IR to place tunneled dialysis catheter, on 1/24 4. Began clip process as AKI, SW aware 5. Status post HD on 1/21 with 1.5 L UF, tolerated well.  Next HD on 1/25 as a make up for missed 1/24 due to high pt census.  Will try to maximize fluid removal today given worsening LE edema.   2. Acute pancreatitis: Noted on CT scan 10/16/20 and again on 1/7. 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  3. Acute hypoxemic RF: likely aspiration PNA +/- COVID pneumonitis, now on RA. S/p speroids.  Resolved  4. Shock: high WBC count, w/u per primary team. Possible ilacus abscess on CT from 1/7. Off levophed 1/11. Mero, zyvox. Slowly improving.     5. COVID-19 infection: vaccinated and boosted- treatment per primary  6. Acute encephalopathy: multifactorial, likely COVID + acute illness. Improved.  8.  Chronic systolic CHF: EF 0/9/98 on TTE 60-65% with no WMA.  9.  Afib: hep and amiodarone po  10. Acute anemia: GI losses + AKI/CKD. Received multiple blood transfusion.  He has a history of metastatic melanoma.  On 1/22, Dr. Carolin Sicks discussed with multiple family members via video call in the patient's room.  The family discussed with the patient's oncologist at [facility] who reportedly said no contraindication for erythropoietin to manage anemia. Being treated with IV iron and ESA.   11.  Thrombocytopenia:  Initially normal, into 50s now back to 80s.  No heparin with dialysis.  Further w/u per primary.    Subjective:   Feeling ok today but worsening edema.  No UOP. For HD today. S/p TDC yesterday.   Objective:   BP (!) 108/52 (BP Location: Right Arm)   Pulse 81   Temp 97.7 F (36.5 C)  (Oral)   Resp 20   Ht _0  (1.727 m)   Wt 96.5 kg   SpO2 94%   BMI 32.35 kg/m   Intake/Output Summary (Last 24 hours) at 11/07/2020 1205 Last data filed at 11/06/2020 1710 Gross per 24 hour  Intake 60 ml  Output -  Net 60 ml   Weight change:   Physical Exam: GEN: Not in distress, comfortable on RA NECK: Supple, no thyromegaly LUNGS: Clear bilateral, no rhonchi CV: RRR, No M/R/G ABD: SNDNT +BS  EXT: 2+  extremity edema ACCESS: RIJ TDC site clean  Imaging: CT ABDOMEN PELVIS WO CONTRAST  Result Date: 11/06/2020 CLINICAL DATA:  Follow-up iliac abscess.  Acute pancreatitis. EXAM: CT ABDOMEN AND PELVIS WITHOUT CONTRAST TECHNIQUE: Multidetector CT imaging of the abdomen and pelvis was performed following the standard protocol without IV contrast. COMPARISON:  CT 10/20/2020 FINDINGS: Lower chest: Lung bases are clear. Hepatobiliary: No focal hepatic lesion. Gallstone within collapsed gallbladder. Pancreas: Exuberant inflammatory response about the pancreas. Inflammatory response difficult to assess without IV contrast or oral on contrast. Potential organized fluid collection measuring 6.4 by 4.0 cm between the pancreas and the body the stomach (image 32/3. Inflammatory nodularity extends into the greater omentum (image 45/3) Spleen: Normal spleen Adrenals/urinary tract: Adrenal glands normal. No obstructive uropathy. Bladder normal. Stomach/Bowel: Stomach and duodenum are grossly normal. No bowel obstruction. Bowel is decompressed. Ascending and transverse colon normal. Multiple diverticula of the descending  colon sigmoid colon. Rectum normal Vascular/Lymphatic: Abdominal aorta is normal caliber with atherosclerotic calcification. There is no retroperitoneal or periportal lymphadenopathy. No pelvic lymphadenopathy. Reproductive: Prostate unremarkable Other: Thickening of the RIGHT iliacus muscle measuring 6.9 x 3.2 cm (image 68/6). This compares to 7.2 x 3.1 cm on CT 04/29/2021. Visually lesion  appears very similar. However, the RIGHT psoas muscle measures 4.4 cm (image 53/7) increased from 3.2 cm Musculoskeletal: No evidence of osteomyelitis or discitis. IMPRESSION: 1. Severe acute pancreatitis. Probable early pseudocyst formation between the pancreas and stomach. 2. Thickened RIGHT iliacus muscle is unchanged. Increased thickness of the RIGHT psoas muscle. Electronically Signed   By: Suzy Bouchard M.D.   On: 11/06/2020 15:08   IR Fluoro Guide CV Line Right  Result Date: 11/06/2020 INDICATION: 87 year old male referred for tunneled hemodialysis catheter EXAM: TUNNELED CENTRAL VENOUS HEMODIALYSIS CATHETER PLACEMENT WITH ULTRASOUND AND FLUOROSCOPIC GUIDANCE MEDICATIONS: None ANESTHESIA/SEDATION: Moderate (conscious) sedation was employed during this procedure. A total of Versed 1.0 mg and Fentanyl 25 mcg was administered intravenously. Moderate Sedation Time: 12 minutes. The patient's level of consciousness and vital signs were monitored continuously by radiology nursing throughout the procedure under my direct supervision. FLUOROSCOPY TIME:  Fluoroscopy Time: 0 minutes 12 seconds (1 mGy). COMPLICATIONS: None PROCEDURE: Informed written consent was obtained from the patient after a discussion of the risks, benefits, and alternatives to treatment. Questions regarding the procedure were encouraged and answered. The right neck and chest were prepped with chlorhexidine in a sterile fashion, and a sterile drape was applied covering the operative field. Maximum barrier sterile technique with sterile gowns and gloves were used for the procedure. A timeout was performed prior to the initiation of the procedure. Ultrasound survey was performed. Micropuncture kit was utilized to access the right internal jugular vein under direct, real-time ultrasound guidance after the overlying soft tissues were anesthetized with 1% lidocaine with epinephrine. Stab incision was made with 11 blade scalpel. Microwire was  passed centrally. The microwire was then marked to measure appropriate internal catheter length. External tunneled length was estimated. A total tip to cuff length of 19 cm was selected. 035 guidewire was advanced to the level of the IVC. Skin and subcutaneous tissues of chest wall below the clavicle were generously infiltrated with 1% lidocaine for local anesthesia. A small stab incision was made with 11 blade scalpel. The selected hemodialysis catheter was tunneled in a retrograde fashion from the anterior chest wall to the venotomy incision. Serial dilation was performed and then a peel-away sheath was placed. The catheter was then placed through the peel-away sheath with tips ultimately positioned within the superior aspect of the right atrium. Final catheter positioning was confirmed and documented with a spot radiographic image. The catheter aspirates and flushes normally. The catheter was flushed with appropriate volume heparin dwells. The catheter exit site was secured with a 0-Prolene retention suture. Gel-Foam slurry was infused into the soft tissue tract. The venotomy incision was closed Derma bond and sterile dressing. Dressings were applied at the chest wall. Patient tolerated the procedure well and remained hemodynamically stable throughout. No complications were encountered and no significant blood loss encountered. IMPRESSION: Status post right IJ tunneled hemodialysis catheter. Signed, Dulcy Fanny. Dellia Nims, RPVI [facility] Electronically Signed   By: Corrie Mckusick D.O.   On: 11/06/2020 16:58   IR US Guide Vasc Access Right  Result Date: 11/06/2020 INDICATION: 86 year old male referred for tunneled hemodialysis catheter EXAM: TUNNELED CENTRAL VENOUS HEMODIALYSIS CATHETER PLACEMENT WITH  ULTRASOUND AND FLUOROSCOPIC GUIDANCE MEDICATIONS: None ANESTHESIA/SEDATION: Moderate (conscious) sedation was employed during this procedure. A total of  Versed 1.0 mg and Fentanyl 25 mcg was administered intravenously. Moderate Sedation Time: 12 minutes. The patient's level of consciousness and vital signs were monitored continuously by radiology nursing throughout the procedure under my direct supervision. FLUOROSCOPY TIME:  Fluoroscopy Time: 0 minutes 12 seconds (1 mGy). COMPLICATIONS: None PROCEDURE: Informed written consent was obtained from the patient after a discussion of the risks, benefits, and alternatives to treatment. Questions regarding the procedure were encouraged and answered. The right neck and chest were prepped with chlorhexidine in a sterile fashion, and a sterile drape was applied covering the operative field. Maximum barrier sterile technique with sterile gowns and gloves were used for the procedure. A timeout was performed prior to the initiation of the procedure. Ultrasound survey was performed. Micropuncture kit was utilized to access the right internal jugular vein under direct, real-time ultrasound guidance after the overlying soft tissues were anesthetized with 1% lidocaine with epinephrine. Stab incision was made with 11 blade scalpel. Microwire was passed centrally. The microwire was then marked to measure appropriate internal catheter length. External tunneled length was estimated. A total tip to cuff length of 19 cm was selected. 035 guidewire was advanced to the level of the IVC. Skin and subcutaneous tissues of chest wall below the clavicle were generously infiltrated with 1% lidocaine for local anesthesia. A small stab incision was made with 11 blade scalpel. The selected hemodialysis catheter was tunneled in a retrograde fashion from the anterior chest wall to the venotomy incision. Serial dilation was performed and then a peel-away sheath was placed. The catheter was then placed through the peel-away sheath with tips ultimately positioned within the superior aspect of the right atrium. Final catheter positioning was confirmed and  documented with a spot radiographic image. The catheter aspirates and flushes normally. The catheter was flushed with appropriate volume heparin dwells. The catheter exit site was secured with a 0-Prolene retention suture. Gel-Foam slurry was infused into the soft tissue tract. The venotomy incision was closed Derma bond and sterile dressing. Dressings were applied at the chest wall. Patient tolerated the procedure well and remained hemodynamically stable throughout. No complications were encountered and no significant blood loss encountered. IMPRESSION: Status post right IJ tunneled hemodialysis catheter. Signed, Dulcy Fanny. Dellia Nims, RPVI [facility] Electronically Signed   By: Corrie Mckusick D.O.   On: 11/06/2020 16:58    Labs: BMET Recent Labs  Lab 11/01/20 0326 11/02/20 0422 11/02/20 1546 11/03/20 0336 11/04/20 0423 11/05/20 0315 11/06/20 0027 11/07/20 0200  NA 133* 137 137 137 136 138 137 133*  K 4.9 4.2 4.5 4.6 4.0 4.3 4.6 4.2  CL 96* 99 100 100 97* 101 101 97*  CO2 19* 23 20* 20* 22 20* 17* 16*  GLUCOSE 75 93 180* 89 93 60* 123* 117*  BUN 101* 69* 81* 95* 65* 88* 110* 135*  CREATININE 6.40* 4.98* 5.88* 6.55* 4.78* 6.07* 7.26* 8.52*  CALCIUM 7.7* 7.6* 7.7* 7.8* 7.8* 7.8* 7.8* 7.8*  PHOS 11.2* 8.4* 9.2* 10.8* 8.3* 9.8* 10.6*  --    CBC Recent Labs  Lab 11/05/20 0315 11/06/20 0027 11/06/20 1007 11/07/20 0200  WBC 38.9* 39.3* 46.6* 41.4*  HGB 6.6* 8.1* 8.4* 7.8*  HCT 19.8* 25.0* 24.4* 23.6*  MCV 90.0 89.3 88.4 89.4  PLT 69* 75* 84* 86*    Medications:    . sodium chloride   Intravenous  Once  . sodium chloride   Intravenous Once  . acetaminophen (TYLENOL) oral liquid 160 mg/5 mL  650 mg Oral Q6H  . amiodarone  400 mg Per Tube BID  . B-complex with vitamin C  1 tablet Per Tube Daily  . Chlorhexidine Gluconate Cloth  6 each Topical Q1200  . Chlorhexidine Gluconate Cloth  6 each Topical Q0600  . darbepoetin  (ARANESP) injection - DIALYSIS  100 mcg Intravenous Q Tue-HD  . [START ON 11/18/2020] darbepoetin (ARANESP) injection - DIALYSIS  60 mcg Intravenous Q Sat-HD  . dextrose  1 ampule Intravenous Once  . feeding supplement (NEPRO CARB STEADY)  237 mL Oral TID BM  . Gerhardt's butt cream   Topical TID  . insulin aspart  3-9 Units Subcutaneous Q4H  . lactobacillus acidophilus  2 tablet Oral TID  . lidocaine  1 patch Transdermal Q24H  . mouth rinse  15 mL Mouth Rinse BID  . phenol  1 spray Mouth/Throat Once  . predniSONE  40 mg Per Tube Q breakfast  . sodium chloride flush  10-40 mL Intracatheter Q12H  . sodium chloride flush  3 mL Intravenous Q12H  . sucralfate  1 g Oral TID WC & HS  . triamcinolone 0.1 % cream : eucerin   Topical BID    Justin Mend, MD  11/07/2020, 12:05 PM

## 2020-11-07 NOTE — Progress Notes (Signed)
Rounded on patient today in correlation to transition to outpatient HD for AKI. Ordered consult to dietician and given Kidney Failure book. Spoke with patient's wife via speaker phone. Will leave copy of Kidney Failure booklet in manilla envelope for patient's wife per request at front desk. Patient educated duriung HD regarding care of tunneled dialysis catheter and proper medication administration on HD days.  Patient also educated on the importance of adhering to scheduled dialysis treatments, the effects of fluid overload, hyperkalemia and hyperphosphatemia. Patient capable of re-verbalizing via teach back method. Also educated patient on services available through the interdisciplinary team in the clinic setting and the differences they will note when transitioning to the outpatient setting from the hospital. Patient's wife asked for info regarding renal navigator. Will forward to [place].Patient with no further questions at this time. Handouts and contact information provided to patient for any further assistance. Will follow as appropriate.   Dorthey Sawyer, RN  Dialysis Nurse Coordinator Phone: [phone]

## 2020-11-08 ENCOUNTER — Inpatient Hospital Stay (HOSPITAL_COMMUNITY): Payer: Medicare Other

## 2020-11-08 LAB — GLUCOSE, CAPILLARY
Glucose-Capillary: 79 mg/dL (ref 70–99)
Glucose-Capillary: 125 mg/dL — ABNORMAL HIGH (ref 70–99)
Glucose-Capillary: 105 mg/dL — ABNORMAL HIGH (ref 70–99)
Glucose-Capillary: 136 mg/dL — ABNORMAL HIGH (ref 70–99)
Glucose-Capillary: 161 mg/dL — ABNORMAL HIGH (ref 70–99)

## 2020-11-08 LAB — CBC
HCT: 22 % — ABNORMAL LOW (ref 39.0–52.0)
Hemoglobin: 7.4 g/dL — ABNORMAL LOW (ref 13.0–17.0)
MCH: 29.5 pg (ref 26.0–34.0)
MCHC: 33.6 g/dL (ref 30.0–36.0)
MCV: 87.6 fL (ref 80.0–100.0)
Platelets: 92 10*3/uL — ABNORMAL LOW (ref 150–400)
RBC: 2.51 MIL/uL — ABNORMAL LOW (ref 4.22–5.81)
RDW: 21.1 % — ABNORMAL HIGH (ref 11.5–15.5)
WBC: 42.3 10*3/uL — ABNORMAL HIGH (ref 4.0–10.5)
nRBC: 0 % (ref 0.0–0.2)

## 2020-11-08 LAB — MAGNESIUM: Magnesium: 2 mg/dL (ref 1.7–2.4)

## 2020-11-08 MED ORDER — LOPERAMIDE HCL 2 MG PO CAPS
4.0000 mg | ORAL_CAPSULE | Freq: Four times a day (QID) | ORAL | Status: DC | PRN
  Administered 2020-11-08: 4 mg via ORAL
  Filled 2020-11-08: qty 2

## 2020-11-08 MED ORDER — MIDODRINE HCL 5 MG PO TABS
10.0000 mg | ORAL_TABLET | ORAL | Status: DC
  Administered 2020-11-08: 10 mg via ORAL
  Filled 2020-11-08: qty 2

## 2020-11-08 NOTE — Plan of Care (Signed)
  Problem: Coping: Goal: Will verbalize positive feelings about self Outcome: Progressing   Problem: Clinical Measurements: Goal: Respiratory complications will improve Outcome: Progressing   Problem: Nutrition: Goal: Adequate nutrition will be maintained Outcome: Progressing

## 2020-11-08 NOTE — Progress Notes (Signed)
Patient has been referred to [facility] clinic and Navigator is awaiting acceptance and seat schedule. Navigator has contacted patient's wife to provide update as someone called her from [facility] reporting that patient would be set up in [facility]. Navigator apologized for confusion. Wife states patient will be going to [place] or [facility] for rehab and that these are the only facilities she will accept for her husband. Navigator noted that HD can sometimes cause difficulty for acceptance, but will pass this along to [facility] CSW/C. [place] assigned to patient today. Patient's wife appreciative and states she will absolutely not want her husband to go to [place].  Navigator updated TOC and will continue to follow closely.  Alphonzo Cruise, [place] [facility] Navigator [phone]

## 2020-11-08 NOTE — Progress Notes (Signed)
Physical Therapy Treatment Patient Details Name: Raymond Hurley. MRN: [id number] DOB: 1931/08/20 Today's Date: 11/08/2020    History of Present Illness Pt is an 87 y.o. male admitted 10/14/20 with hypotension, diarrhea, COVID-19. Course complicated by AMS, aspiration PNA, multiple organ failure, pancreatitis. New onset afib with RVR 1/4 and transfer to ICU. CRRT 1/4-1/12; intermittent HD initiated 1/15. PMH includes metastatic melanoma, afib, CHF.    PT Comments    Returned to assist pt back to bed after 45 minutes up in chair. Pt hadn't been up in chair in > 1 week and didn't want pt to fatigue to the point of not being able to stand from chair. Pt did fairly well and enjoyed being up. Anticipate pt will have good tolerance for OOB as strength continues to improve.    Follow Up Recommendations  Supervision/Assistance - 24 hour;CIR     Equipment Recommendations  Rolling walker with 5" wheels;Wheelchair (measurements PT);Wheelchair cushion (measurements PT);3in1 (PT)    Recommendations for Other Services Rehab consult     Precautions / Restrictions Precautions Precautions: Fall;Other (comment) Precaution Comments: Bowel incontinence (apparently had flexiseal removed yesterday) Restrictions Weight Bearing Restrictions: No    Mobility  Bed Mobility Overal bed mobility: Needs Assistance Bed Mobility: Sit to Sidelying       Sit to sidelying: +2 for physical assistance;Mod assist General bed mobility comments: Assist to lower shoulders and bring legs back up in bed  Transfers Overall transfer level: Needs assistance Equipment used: Ambulation equipment used Transfers: Sit to/from Stand Sit to Stand: +2 physical assistance;Mod assist         General transfer comment: Used bed pad to bring hips up from recliner. Incr time to rise.  Ambulation/Gait                 Stairs             Wheelchair Mobility    Modified Rankin (Stroke Patients Only)        Balance Overall balance assessment: Needs assistance Sitting-balance support: Feet supported;Bilateral upper extremity supported Sitting balance-Leahy Scale: Poor Sitting balance - Comments: UE support for balance   Standing balance support: Bilateral upper extremity supported Standing balance-Leahy Scale: Poor Standing balance comment: Stood with Stedy x 2 for 10 sec with +2 min to maintain                            Cognition Arousal/Alertness: Awake/alert Behavior During Therapy: WFL for tasks assessed/performed Overall Cognitive Status: Within Functional Limits for tasks assessed                                        Exercises      General Comments General comments (skin integrity, edema, etc.): VSS on RA      Pertinent Vitals/Pain Pain Assessment: No/denies pain Faces Pain Scale: No hurt Pain Intervention(s): Monitored during session;Repositioned    Home Living                      Prior Function            PT Goals (current goals can now be found in the care plan section) Acute Rehab PT Goals Patient Stated Goal: I want to get stronger PT Goal Formulation: With patient Time For Goal Achievement: 11/22/20 Potential to Achieve Goals: Good Progress towards PT  goals: Progressing toward goals    Frequency    Min 3X/week      PT Plan Current plan remains appropriate    Co-evaluation PT/OT/SLP Co-Evaluation/Treatment: Yes Reason for Co-Treatment: For patient/therapist safety PT goals addressed during session: Mobility/safety with mobility OT goals addressed during session: ADL's and self-care;Strengthening/ROM;Proper use of Adaptive equipment and DME      AM-PAC PT "6 Clicks" Mobility   Outcome Measure  Help needed turning from your back to your side while in a flat bed without using bedrails?: A Lot Help needed moving from lying on your back to sitting on the side of a flat bed without using bedrails?: A  Lot Help needed moving to and from a bed to a chair (including a wheelchair)?: A Lot Help needed standing up from a chair using your arms (e.g., wheelchair or bedside chair)?: A Lot Help needed to walk in hospital room?: Total Help needed climbing 3-5 steps with a railing? : Total 6 Click Score: 10    End of Session   Activity Tolerance: Patient tolerated treatment well Patient left: in bed;with call bell/phone within reach;with nursing/sitter in room Nurse Communication: Mobility status;Need for lift equipment PT Visit Diagnosis: Other abnormalities of gait and mobility (R26.89);Difficulty in walking, not elsewhere classified (R26.2);Muscle weakness (generalized) (M62.81)     Time: 0677-0340 PT Time Calculation (min) (ACUTE ONLY): 14 min  Charges:  $Therapeutic Activity: 8-22 mins                     [place] Pager [phone] Office [place] 11/08/2020, 12:47 PM

## 2020-11-08 NOTE — Progress Notes (Signed)
Pt transferred to [address]; room 24. All belongings and bedside medications sent with patient.

## 2020-11-08 NOTE — Progress Notes (Signed)
PROGRESS NOTE    [name].  OHY:[id number] DOB: [birth year] DOA: 10/14/2020 PCP: Lajean Manes, MD   Brief Narrative:   87 year old man who is hypotensive in the context of acute diarrhea on 1/1 was admitted w/ dx of COVID and acute dehydration from acute diarrheal illness. Of note he was fully vaccinated and had received his booster. Given worsening delirium, multiple organ failure, and profound hypotension with CT abdomen showing pancreatitis. Given advanced age and multiple organ dysfunction in addition to concern about further clinical decline patient was transitioned to critical care team.  1/25: c/o diarrhea ON. Received imodium and improved.    Assessment & Plan: Acute blood loss anemia to ongoing GI bleed requiring multiple transfusions; Concurrent anemia of kidney disease, profound dehydration and acute illness - multiple transfusions during this stay - he has had heme-positive stools, but is not a candidate for endoscopy currently given stable labs and high risk for procedure. - Nephrology started Epo and iron Hemoglobin slightly dropped to 7.4 today.  Acute hypoxic respiratory failure 2/2 aspiration PNA, resolved Cannot rule out concurrent/resolving COVID-19 infection  - good sats on RA - continue ambulation, IS, FV - continue prednisone 24m qday; wean as able     - 1/25: resp status is good. Has borderline BP today. Give small amount of fluids. Keep steroids at current dosing today  Diarrhea He had 3-4 bowel movements yesterday so he was started on Imodium.  This morning he believes that he is impacted although his last bowel movement was about 12 hours ago.  I reassured him that it is not constipation however he wants me to discontinue Imodium which I have done already.  Septic shock, resolved Possible iliac abscess - case d/w IR/CCS on 1/7, no surgery or drain indicated - Meropenem/linezolid completed - follow for fever  curve/symptoms. Will likely need to involve ID if concern for worsening/re-infection given profound antibiotic use.     - 1/25: rpt CT shows stable thickening of the right iliacus muscle, but increased thickening of the rigth psoas muscle; no definitive abscess seen; follow for now  Acute metabolic encephalopathy, multifactorial: In the setting of uremia, septic shock, pancreatitis, anemia and COVID-19 infection; Resolved - continue supportive care, delirium precautions     - resolved  Acute renal failure requiring HD Hypophosphatemia, mild - Suspect secondary to ATN after recent diarrheal illness and volume depletion in the setting of profound disease, septic shock, COVID infection - Nephrology following - CRRT clotted off 10/25/20 - HD MWF ongoing as tolerated     - 1/25: s/p TDC placement; nephro working out Outpt HD spot  Severe acute pancreatitis - continue pain control, supportive care - NG tube/feeds removed 10/28/20 - tolerate PO well, passed SLP eval - Rectal tube removed     - 1/25: Rpt CT ab/pelvis shows possible early pseudocyst formation     - previously eval'd by GI (10/16/20) and rec'd MRCP w/ contrast after acute kidney injury has resolved; 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 fib over the hospitalization  - Heparin drip stopped due to hematochezia 1/16 - continue amiodarone  Goals of care discussion  - At this point family and patient continued to impress upon the medical staff they would like to continue aggressive med care including hemodialysis, blood transfusions and procedures as  indicated - Patient and family have agreed against intubation, CPR; he is DNR  DVT prophylaxis: SCDs Code Status: DNR Family Communication: None at  bedside.  Had a video call with his wife and his daughter while I was seeing him. Status is: Inpatient  Remains inpatient appropriate because:Inpatient level of care appropriate due to severity of illness   Dispo: The patient is from: Home              Anticipated d/c is to: TBD              Anticipated d/c date is: 3 days              Patient currently is medically stable to d/c.   Difficult to place patient No  Consultants:   Nephrology  PCCM  GI  Cardiology  Neurology  Palliative Care  Subjective: Patient seen and examined.  He was sitting in his bed and having videoconference with his daughter and wife when I entered the room.  He was pretty comfortable, alert and oriented.  Did not have any other complaint except the fact that he felt that he is impacted although his last bowel movement was yesterday evening.  He requested to discontinue Imodium.  I informed/updated the family with all the labs today.  Objective: Vitals:   11/08/20 0043 11/08/20 0411 11/08/20 0747 11/08/20 1216  BP: 103/63 (!) 102/52 (!) 106/52 (!) 120/59  Pulse: 75 83 80 81  Resp: '18 19 18 18  ' Temp: 98.3 F (36.8 C) 97.6 F (36.4 C) 98.5 F (36.9 C) 98 F (36.7 C)  TempSrc: Oral Oral Oral Oral  SpO2: 97% 92% 92%   Weight:  92.8 kg    Height:        Intake/Output Summary (Last 24 hours) at 11/08/2020 1509 Last data filed at 11/07/2020 2000 Gross per 24 hour  Intake 360 ml  Output 2446 ml  Net -2086 ml   Filed Weights   11/07/20 1345 11/07/20 1721 11/08/20 0411  Weight: 95.7 kg 93.3 kg 92.8 kg    Examination:  General exam: Appears calm and comfortable  Respiratory system: Clear to auscultation. Respiratory effort normal. Cardiovascular system: S1 & S2 heard, RRR. No JVD, murmurs, rubs, gallops or clicks. No pedal edema. Gastrointestinal system: Abdomen is nondistended, soft and nontender. No organomegaly or masses felt. Normal bowel sounds heard. Central nervous system: Alert and  oriented. No focal neurological deficits. Extremities: Symmetric 5 x 5 power. Skin: No rashes, lesions or ulcers.  Psychiatry: Judgement and insight appear normal. Mood & affect appropriate.   Data Reviewed: I have personally reviewed following labs and imaging studies.  CBC: Recent Labs  Lab 11/05/20 0315 11/06/20 0027 11/06/20 1007 11/07/20 0200 11/08/20 0223  WBC 38.9* 39.3* 46.6* 41.4* 42.3*  HGB 6.6* 8.1* 8.4* 7.8* 7.4*  HCT 19.8* 25.0* 24.4* 23.6* 22.0*  MCV 90.0 89.3 88.4 89.4 87.6  PLT 69* 75* 84* 86* 92*   Basic Metabolic Panel: Recent Labs  Lab 11/02/20 1546 11/03/20 0336 11/04/20 0423 11/05/20 0315 11/06/20 0027 11/07/20 0200 11/08/20 0223  NA 137 137 136 138 137 133*  --   K 4.5 4.6 4.0 4.3 4.6 4.2  --   CL 100 100 97* 101 101 97*  --   CO2 20* 20* 22 20* 17* 16*  --   GLUCOSE 180* 89 93 60* 123* 117*  --   BUN 81* 95* 65* 88* 110* 135*  --   CREATININE 5.88* 6.55* 4.78* 6.07* 7.26*  8.52*  --   CALCIUM 7.7* 7.8* 7.8* 7.8* 7.8* 7.8*  --   MG  --  2.3 2.0 2.1 2.2 2.3 2.0  PHOS 9.2* 10.8* 8.3* 9.8* 10.6*  --   --    GFR: Estimated Creatinine Clearance: 6.8 mL/min (A) (by C-G formula based on SCr of 8.52 mg/dL (H)). Liver Function Tests: Recent Labs  Lab 11/04/20 0423 11/05/20 0315 11/06/20 0027 11/06/20 1007 11/07/20 0200  AST  --   --   --  32 26  ALT  --   --   --  25 21  ALKPHOS  --   --   --  125 109  BILITOT  --   --   --  1.1 1.0  PROT  --   --   --  4.9* 4.5*  ALBUMIN 1.9* 1.7* 1.8* 2.0* 1.7*   No results for input(s): LIPASE, AMYLASE in the last 168 hours. No results for input(s): AMMONIA in the last 168 hours. Coagulation Profile: No results for input(s): INR, PROTIME in the last 168 hours. Cardiac Enzymes: No results for input(s): CKTOTAL, CKMB, CKMBINDEX, TROPONINI in the last 168 hours. BNP (last 3 results) No results for input(s): PROBNP in the last 8760 hours. HbA1C: No results for input(s): HGBA1C in the last 72  hours. CBG: Recent Labs  Lab 11/07/20 2025 11/07/20 2334 11/08/20 0421 11/08/20 0814 11/08/20 1200  GLUCAP 135* 79 79 125* 105*   Lipid Profile: No results for input(s): CHOL, HDL, LDLCALC, TRIG, CHOLHDL, LDLDIRECT in the last 72 hours. Thyroid Function Tests: No results for input(s): TSH, T4TOTAL, FREET4, T3FREE, THYROIDAB in the last 72 hours. Anemia Panel: No results for input(s): VITAMINB12, FOLATE, FERRITIN, TIBC, IRON, RETICCTPCT in the last 72 hours. Sepsis Labs: No results for input(s): PROCALCITON, LATICACIDVEN in the last 168 hours.  No results found for this or any previous visit (from the past 240 hour(s)).    Radiology Studies: IR Fluoro Guide CV Line Right  Result Date: 11/06/2020 INDICATION: 87 year old male referred for tunneled hemodialysis catheter EXAM: TUNNELED CENTRAL VENOUS HEMODIALYSIS CATHETER PLACEMENT WITH ULTRASOUND AND FLUOROSCOPIC GUIDANCE MEDICATIONS: None ANESTHESIA/SEDATION: Moderate (conscious) sedation was employed during this procedure. A total of Versed 1.0 mg and Fentanyl 25 mcg was administered intravenously. Moderate Sedation Time: 12 minutes. The patient's level of consciousness and vital signs were monitored continuously by radiology nursing throughout the procedure under my direct supervision. FLUOROSCOPY TIME:  Fluoroscopy Time: 0 minutes 12 seconds (1 mGy). COMPLICATIONS: None PROCEDURE: Informed written consent was obtained from the patient after a discussion of the risks, benefits, and alternatives to treatment. Questions regarding the procedure were encouraged and answered. The right neck and chest were prepped with chlorhexidine in a sterile fashion, and a sterile drape was applied covering the operative field. Maximum barrier sterile technique with sterile gowns and gloves were used for the procedure. A timeout was performed prior to the initiation of the procedure. Ultrasound survey was performed. Micropuncture kit was utilized to access the  right internal jugular vein under direct, real-time ultrasound guidance after the overlying soft tissues were anesthetized with 1% lidocaine with epinephrine. Stab incision was made with 11 blade scalpel. Microwire was passed centrally. The microwire was then marked to measure appropriate internal catheter length. External tunneled length was estimated. A total tip to cuff length of 19 cm was selected. 035 guidewire was advanced to the level of the IVC. Skin and subcutaneous tissues of chest wall below the clavicle were generously infiltrated with 1% lidocaine for  local anesthesia. A small stab incision was made with 11 blade scalpel. The selected hemodialysis catheter was tunneled in a retrograde fashion from the anterior chest wall to the venotomy incision. Serial dilation was performed and then a peel-away sheath was placed. The catheter was then placed through the peel-away sheath with tips ultimately positioned within the superior aspect of the right atrium. Final catheter positioning was confirmed and documented with a spot radiographic image. The catheter aspirates and flushes normally. The catheter was flushed with appropriate volume heparin dwells. The catheter exit site was secured with a 0-Prolene retention suture. Gel-Foam slurry was infused into the soft tissue tract. The venotomy incision was closed Derma bond and sterile dressing. Dressings were applied at the chest wall. Patient tolerated the procedure well and remained hemodynamically stable throughout. No complications were encountered and no significant blood loss encountered. IMPRESSION: Status post right IJ tunneled hemodialysis catheter. Signed, Dulcy Fanny. Dellia Nims, RPVI [facility] Radiology Electronically Signed   By: Corrie Mckusick D.O.   On: 11/06/2020 16:58   IR US Guide Vasc Access Right  Result Date: 11/06/2020 INDICATION: 87 year old male referred for tunneled hemodialysis catheter  EXAM: TUNNELED CENTRAL VENOUS HEMODIALYSIS CATHETER PLACEMENT WITH ULTRASOUND AND FLUOROSCOPIC GUIDANCE MEDICATIONS: None ANESTHESIA/SEDATION: Moderate (conscious) sedation was employed during this procedure. A total of Versed 1.0 mg and Fentanyl 25 mcg was administered intravenously. Moderate Sedation Time: 12 minutes. The patient's level of consciousness and vital signs were monitored continuously by radiology nursing throughout the procedure under my direct supervision. FLUOROSCOPY TIME:  Fluoroscopy Time: 0 minutes 12 seconds (1 mGy). COMPLICATIONS: None PROCEDURE: Informed written consent was obtained from the patient after a discussion of the risks, benefits, and alternatives to treatment. Questions regarding the procedure were encouraged and answered. The right neck and chest were prepped with chlorhexidine in a sterile fashion, and a sterile drape was applied covering the operative field. Maximum barrier sterile technique with sterile gowns and gloves were used for the procedure. A timeout was performed prior to the initiation of the procedure. Ultrasound survey was performed. Micropuncture kit was utilized to access the right internal jugular vein under direct, real-time ultrasound guidance after the overlying soft tissues were anesthetized with 1% lidocaine with epinephrine. Stab incision was made with 11 blade scalpel. Microwire was passed centrally. The microwire was then marked to measure appropriate internal catheter length. External tunneled length was estimated. A total tip to cuff length of 19 cm was selected. 035 guidewire was advanced to the level of the IVC. Skin and subcutaneous tissues of chest wall below the clavicle were generously infiltrated with 1% lidocaine for local anesthesia. A small stab incision was made with 11 blade scalpel. The selected hemodialysis catheter was tunneled in a retrograde fashion from the anterior chest wall to the venotomy incision. Serial dilation was performed  and then a peel-away sheath was placed. The catheter was then placed through the peel-away sheath with tips ultimately positioned within the superior aspect of the right atrium. Final catheter positioning was confirmed and documented with a spot radiographic image. The catheter aspirates and flushes normally. The catheter was flushed with appropriate volume heparin dwells. The catheter exit site was secured with a 0-Prolene retention suture. Gel-Foam slurry was infused into the soft tissue tract. The venotomy incision was closed Derma bond and sterile dressing. Dressings were applied at the chest wall. Patient tolerated the procedure well and remained hemodynamically stable throughout. No complications were encountered and no significant blood loss encountered.  IMPRESSION: Status post right IJ tunneled hemodialysis catheter. Signed, Dulcy Fanny. Dellia Nims, RPVI [facility] Radiology Electronically Signed   By: Corrie Mckusick D.O.   On: 11/06/2020 16:58     Scheduled Meds: . sodium chloride   Intravenous Once  . sodium chloride   Intravenous Once  . acetaminophen (TYLENOL) oral liquid 160 mg/5 mL  650 mg Oral Q6H  . amiodarone  400 mg Per Tube BID  . B-complex with vitamin C  1 tablet Per Tube Daily  . Chlorhexidine Gluconate Cloth  6 each Topical Q1200  . Chlorhexidine Gluconate Cloth  6 each Topical Q0600  . darbepoetin (ARANESP) injection - DIALYSIS  100 mcg Intravenous Q Tue-HD  . [START ON 11/18/2020] darbepoetin (ARANESP) injection - DIALYSIS  60 mcg Intravenous Q Sat-HD  . dextrose  1 ampule Intravenous Once  . feeding supplement (NEPRO CARB STEADY)  237 mL Oral TID BM  . Gerhardt's butt cream   Topical TID  . insulin aspart  3-9 Units Subcutaneous Q4H  . lactobacillus acidophilus  2 tablet Oral TID  . lidocaine  1 patch Transdermal Q24H  . mouth rinse  15 mL Mouth Rinse BID  . midodrine  10 mg Oral Q M,W,F-HD  . phenol  1 spray Mouth/Throat Once   . predniSONE  40 mg Per Tube Q breakfast  . sodium chloride flush  10-40 mL Intracatheter Q12H  . sodium chloride flush  3 mL Intravenous Q12H  . sucralfate  1 g Oral TID WC & HS  . triamcinolone 0.1 % cream : eucerin   Topical BID   Continuous Infusions: . sodium chloride Stopped (10/27/20 0931)     LOS: 25 days    Time spent: 32 minutes   Darliss Cheney, MD [facility]  If 7PM-7AM, please contact night-coverage [email or web address] 11/08/2020, 3:09 PM

## 2020-11-08 NOTE — Progress Notes (Signed)
Occupational Therapy Treatment Patient Details Name: Raymond Hurley. MRN: [id number] DOB: 1931/05/02 Today's Date: 11/08/2020    History of present illness Pt is an 87 y.o. male admitted 10/14/20 with hypotension, diarrhea, COVID-19. Course complicated by AMS, aspiration PNA, multiple organ failure, pancreatitis. New onset afib with RVR 1/4 and transfer to ICU. CRRT 1/4-1/12; intermittent HD initiated 1/15. PMH includes metastatic melanoma, afib, CHF.   OT comments  Pt making excellent progress towards OT goals this session, mod A for UB bathing, total A for LB dressing, VSS on RA throughout (and off COVID precautions). Pt was able to come to EOB with mod A and assist from bed pad, and then stand x3 with use of Stedy initially with light mod A +2 and each time fatiguing and requiring more assist - last time heavy mod A +2. Pt is VERY motivated and would make excellent candidate for CIR to maximize safety and independence in ADL and functional transfers. Pt would enjoy and benefit from HEP with theraband.   Follow Up Recommendations  CIR    Equipment Recommendations  3 in 1 bedside commode;Other (comment) (defer to next venue)    Recommendations for Other Services      Precautions / Restrictions Precautions Precautions: Fall;Other (comment) Precaution Comments: Bowel incontinence (apparently had flexiseal removed yesterday) Restrictions Weight Bearing Restrictions: No       Mobility Bed Mobility Overal bed mobility: Needs Assistance Bed Mobility: Rolling;Sidelying to Sit Rolling: Mod assist Sidelying to sit: +2 for physical assistance;Mod assist       General bed mobility comments: Assist to bring hips/shoulders over. Assist to bring legs off EOB, elevate trunk into sitting, and bring hips to EOB  Transfers Overall transfer level: Needs assistance Equipment used: Ambulation equipment used Transfers: Sit to/from Stand Sit to Stand: +2 physical assistance;Mod assist;From  elevated surface         General transfer comment: Assist to bring hips up. Stood x 3 with first stand requiring light +2 mod assist and incr to heavier +2 mod assist with subsequent stands. Used Stedy for bed to chair    Balance Overall balance assessment: Needs assistance Sitting-balance support: Feet supported;Bilateral upper extremity supported Sitting balance-Leahy Scale: Poor Sitting balance - Comments: UE support for balance   Standing balance support: Bilateral upper extremity supported Standing balance-Leahy Scale: Poor Standing balance comment: Stood with Stedy for 10-20 sec with +2 min to maintain                           ADL either performed or assessed with clinical judgement   ADL Overall ADL's : Needs assistance/impaired Eating/Feeding: Set up;Sitting   Grooming: Wash/dry hands;Wash/dry face;Set up;Sitting Grooming Details (indicate cue type and reason): EOB     Lower Body Bathing: Moderate assistance;Sitting/lateral leans Lower Body Bathing Details (indicate cue type and reason): assist for back     Lower Body Dressing: Total assistance;Bed level Lower Body Dressing Details (indicate cue type and reason): for socks Toilet Transfer: Moderate assistance;+2 for physical assistance;+2 for safety/equipment;BSC (stedy) Toilet Transfer Details (indicate cue type and reason): initially light mod A +2 and with each sit<>stand increased assist required Toileting- Clothing Manipulation and Hygiene: Total assistance;+2 for physical assistance;Bed level         General ADL Comments: greatly improving and very motivated to return to [place]  Cognition Arousal/Alertness: Awake/alert Behavior During Therapy: WFL for tasks assessed/performed Overall Cognitive Status: Within Functional Limits for tasks assessed                                          Exercises     Shoulder Instructions        General Comments VSS on RA    Pertinent Vitals/ Pain       Pain Assessment: No/denies pain Pain Intervention(s): Monitored during session;Repositioned  Home Living                                          Prior Functioning/Environment              Frequency  Min 2X/week        Progress Toward Goals  OT Goals(current goals can now be found in the care plan section)  Progress towards OT goals: Progressing toward goals  Acute Rehab OT Goals Patient Stated Goal: I want to get stronger OT Goal Formulation: With patient Time For Goal Achievement: 11/14/20 Potential to Achieve Goals: Good  Plan Discharge plan needs to be updated;Frequency remains appropriate    Co-evaluation    PT/OT/SLP Co-Evaluation/Treatment: Yes Reason for Co-Treatment: For patient/therapist safety;To address functional/ADL transfers PT goals addressed during session: Mobility/safety with mobility;Balance;Proper use of DME;Strengthening/ROM OT goals addressed during session: ADL's and self-care;Strengthening/ROM;Proper use of Adaptive equipment and DME      AM-PAC OT "6 Clicks" Daily Activity     Outcome Measure   Help from another person eating meals?: A Little Help from another person taking care of personal grooming?: A Little Help from another person toileting, which includes using toliet, bedpan, or urinal?: A Lot Help from another person bathing (including washing, rinsing, drying)?: A Lot Help from another person to put on and taking off regular upper body clothing?: A Little Help from another person to put on and taking off regular lower body clothing?: Total 6 Click Score: 14    End of Session Equipment Utilized During Treatment:  (STEDY)  OT Visit Diagnosis: Unsteadiness on feet (R26.81);Muscle weakness (generalized) (M62.81);Other abnormalities of gait and mobility (R26.89)   Activity Tolerance Patient tolerated treatment well   Patient Left in  chair;with call bell/phone within reach;with chair alarm set   Nurse Communication Mobility status;Need for lift equipment        Time: 1054-1120 OT Time Calculation (min): 26 min  Charges: OT General Charges $OT Visit: 1 Visit OT Treatments $Self Care/Home Management : 8-22 mins  Jesse Sans OTR/L Acute Rehabilitation Services Pager: [phone] Office: [address] 11/08/2020, 11:56 AM

## 2020-11-08 NOTE — Progress Notes (Signed)
[facility] Progress Note     Assessment/ Plan:   1. Dialysis dependent AKI 2/2 ATN. 02/2020 Cr 1.2. 1. CRRT 1/4-1/12. And now tol iHD on MWF COVID shift 2. Patient/family desire ongoing dialysis after discussion on 1/20 3. IR to place tunneled dialysis catheter, on 1/24 4. Began clip process as AKI, SW aware 5. Status post HD on 1/21 with 1.5 L UF, tolerated well but BP limited UF.  Next HD on 1/25 as a make up for missed 1/24 due to high pt census with better UF 2.4L with midodrine.  Plan next HD 1/27.  Will try to maximize fluid removal today given worsening LE edema.  Midodrine prior.  2. Acute pancreatitis: Noted on CT scan 10/16/20 and again on 1/7. 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  3. Acute hypoxemic RF: likely aspiration PNA +/- COVID pneumonitis, now on RA. S/p speroids.  Resolved and off isolation now.   4. Shock: high WBC count, w/u per primary team. Possible ilacus abscess on CT from 1/7. Off levophed 1/11. Completed courese of Mero, zyvox. Slowly improving but WBC remains 40k.     5. COVID-19 infection: vaccinated and boosted- treatment per primary  6. Acute encephalopathy: multifactorial, likely COVID + acute illness. Improved.  8.  Chronic systolic CHF: EF 02/17/80 on TTE 60-65% with no WMA.  9.  Afib: hep and amiodarone po  10. Acute anemia: GI losses + AKI/CKD. Received multiple blood transfusion.  He has a history of metastatic melanoma.  On 1/22, Dr. Carolin Sicks discussed with multiple family members via video call in the patient's room.  The family discussed with the patient's oncologist at [facility] who reportedly said no contraindication for erythropoietin to manage anemia. Being treated with IV iron and ESA .   11.  Thrombocytopenia:  Initially normal, into 50s now back to 90s.  No heparin with dialysis.  Further w/u per primary.    Subjective:   Tolerated HD yesterday  better with addition of midodrine 75m UF was 2.4L Remains anuric.  Feeling good today.    Objective:   BP (!) 106/52 (BP Location: Left Arm)   Pulse 80   Temp 98.5 F (36.9 C) (Oral)   Resp 18   Ht _0  (1.727 m)   Wt 92.8 kg   SpO2 92%   BMI 31.11 kg/m   Intake/Output Summary (Last 24 hours) at 11/08/2020 0857 Last data filed at 11/07/2020 2000 Gross per 24 hour  Intake 360 ml  Output 2446 ml  Net -2086 ml   Weight change: -0.8 kg  Physical Exam: GEN: Not in distress, comfortable on RA NECK: Supple, no thyromegaly LUNGS: Clear bilateral, no rhonchi CV: RRR, No M/R/G ABD: SNDNT +BS  EXT: 2+  extremity edema with some improvement c/w yest but still significant ACCESS: RIJ TDC site clean  Imaging: CT ABDOMEN PELVIS WO CONTRAST  Result Date: 11/06/2020 CLINICAL DATA:  Follow-up iliac abscess.  Acute pancreatitis. EXAM: CT ABDOMEN AND PELVIS WITHOUT CONTRAST TECHNIQUE: Multidetector CT imaging of the abdomen and pelvis was performed following the standard protocol without IV contrast. COMPARISON:  CT 10/20/2020 FINDINGS: Lower chest: Lung bases are clear. Hepatobiliary: No focal hepatic lesion. Gallstone within collapsed gallbladder. Pancreas: Exuberant inflammatory response about the pancreas. Inflammatory response difficult to assess without IV contrast or oral on contrast. Potential organized fluid collection measuring 6.4 by 4.0 cm between the pancreas and the body the stomach (image 32/3. Inflammatory  nodularity extends into the greater omentum (image 45/3) Spleen: Normal spleen Adrenals/urinary tract: Adrenal glands normal. No obstructive uropathy. Bladder normal. Stomach/Bowel: Stomach and duodenum are grossly normal. No bowel obstruction. Bowel is decompressed. Ascending and transverse colon normal. Multiple diverticula of the descending colon sigmoid colon. Rectum normal Vascular/Lymphatic: Abdominal aorta is normal caliber with atherosclerotic calcification. There is no  retroperitoneal or periportal lymphadenopathy. No pelvic lymphadenopathy. Reproductive: Prostate unremarkable Other: Thickening of the RIGHT iliacus muscle measuring 6.9 x 3.2 cm (image 68/6). This compares to 7.2 x 3.1 cm on CT 04/29/2021. Visually lesion appears very similar. However, the RIGHT psoas muscle measures 4.4 cm (image 53/7) increased from 3.2 cm Musculoskeletal: No evidence of osteomyelitis or discitis. IMPRESSION: 1. Severe acute pancreatitis. Probable early pseudocyst formation between the pancreas and stomach. 2. Thickened RIGHT iliacus muscle is unchanged. Increased thickness of the RIGHT psoas muscle. Electronically Signed   By: Suzy Bouchard M.D.   On: 11/06/2020 15:08   IR Fluoro Guide CV Line Right  Result Date: 11/06/2020 INDICATION: 87 year old male referred for tunneled hemodialysis catheter EXAM: TUNNELED CENTRAL VENOUS HEMODIALYSIS CATHETER PLACEMENT WITH ULTRASOUND AND FLUOROSCOPIC GUIDANCE MEDICATIONS: None ANESTHESIA/SEDATION: Moderate (conscious) sedation was employed during this procedure. A total of Versed 1.0 mg and Fentanyl 25 mcg was administered intravenously. Moderate Sedation Time: 12 minutes. The patient's level of consciousness and vital signs were monitored continuously by radiology nursing throughout the procedure under my direct supervision. FLUOROSCOPY TIME:  Fluoroscopy Time: 0 minutes 12 seconds (1 mGy). COMPLICATIONS: None PROCEDURE: Informed written consent was obtained from the patient after a discussion of the risks, benefits, and alternatives to treatment. Questions regarding the procedure were encouraged and answered. The right neck and chest were prepped with chlorhexidine in a sterile fashion, and a sterile drape was applied covering the operative field. Maximum barrier sterile technique with sterile gowns and gloves were used for the procedure. A timeout was performed prior to the initiation of the procedure. Ultrasound survey was performed.  Micropuncture kit was utilized to access the right internal jugular vein under direct, real-time ultrasound guidance after the overlying soft tissues were anesthetized with 1% lidocaine with epinephrine. Stab incision was made with 11 blade scalpel. Microwire was passed centrally. The microwire was then marked to measure appropriate internal catheter length. External tunneled length was estimated. A total tip to cuff length of 19 cm was selected. 035 guidewire was advanced to the level of the IVC. Skin and subcutaneous tissues of chest wall below the clavicle were generously infiltrated with 1% lidocaine for local anesthesia. A small stab incision was made with 11 blade scalpel. The selected hemodialysis catheter was tunneled in a retrograde fashion from the anterior chest wall to the venotomy incision. Serial dilation was performed and then a peel-away sheath was placed. The catheter was then placed through the peel-away sheath with tips ultimately positioned within the superior aspect of the right atrium. Final catheter positioning was confirmed and documented with a spot radiographic image. The catheter aspirates and flushes normally. The catheter was flushed with appropriate volume heparin dwells. The catheter exit site was secured with a 0-Prolene retention suture. Gel-Foam slurry was infused into the soft tissue tract. The venotomy incision was closed Derma bond and sterile dressing. Dressings were applied at the chest wall. Patient tolerated the procedure well and remained hemodynamically stable throughout. No complications were encountered and no significant blood loss encountered. IMPRESSION: Status post right IJ tunneled hemodialysis catheter. Signed, Dulcy Fanny. Dellia Nims, [place] [facility]  [facility] Electronically Signed   By: Corrie Mckusick D.O.   On: 11/06/2020 16:58   IR US Guide Vasc Access Right  Result Date: 11/06/2020 INDICATION: 87 year old male  referred for tunneled hemodialysis catheter EXAM: TUNNELED CENTRAL VENOUS HEMODIALYSIS CATHETER PLACEMENT WITH ULTRASOUND AND FLUOROSCOPIC GUIDANCE MEDICATIONS: None ANESTHESIA/SEDATION: Moderate (conscious) sedation was employed during this procedure. A total of Versed 1.0 mg and Fentanyl 25 mcg was administered intravenously. Moderate Sedation Time: 12 minutes. The patient's level of consciousness and vital signs were monitored continuously by radiology nursing throughout the procedure under my direct supervision. FLUOROSCOPY TIME:  Fluoroscopy Time: 0 minutes 12 seconds (1 mGy). COMPLICATIONS: None PROCEDURE: Informed written consent was obtained from the patient after a discussion of the risks, benefits, and alternatives to treatment. Questions regarding the procedure were encouraged and answered. The right neck and chest were prepped with chlorhexidine in a sterile fashion, and a sterile drape was applied covering the operative field. Maximum barrier sterile technique with sterile gowns and gloves were used for the procedure. A timeout was performed prior to the initiation of the procedure. Ultrasound survey was performed. Micropuncture kit was utilized to access the right internal jugular vein under direct, real-time ultrasound guidance after the overlying soft tissues were anesthetized with 1% lidocaine with epinephrine. Stab incision was made with 11 blade scalpel. Microwire was passed centrally. The microwire was then marked to measure appropriate internal catheter length. External tunneled length was estimated. A total tip to cuff length of 19 cm was selected. 035 guidewire was advanced to the level of the IVC. Skin and subcutaneous tissues of chest wall below the clavicle were generously infiltrated with 1% lidocaine for local anesthesia. A small stab incision was made with 11 blade scalpel. The selected hemodialysis catheter was tunneled in a retrograde fashion from the anterior chest wall to the  venotomy incision. Serial dilation was performed and then a peel-away sheath was placed. The catheter was then placed through the peel-away sheath with tips ultimately positioned within the superior aspect of the right atrium. Final catheter positioning was confirmed and documented with a spot radiographic image. The catheter aspirates and flushes normally. The catheter was flushed with appropriate volume heparin dwells. The catheter exit site was secured with a 0-Prolene retention suture. Gel-Foam slurry was infused into the soft tissue tract. The venotomy incision was closed Derma bond and sterile dressing. Dressings were applied at the chest wall. Patient tolerated the procedure well and remained hemodynamically stable throughout. No complications were encountered and no significant blood loss encountered. IMPRESSION: Status post right IJ tunneled hemodialysis catheter. Signed, Dulcy Fanny. Dellia Nims, [facility] Electronically Signed   By: Corrie Mckusick D.O.   On: 11/06/2020 16:58    Labs: BMET Recent Labs  Lab 11/02/20 0422 11/02/20 1546 11/03/20 0336 11/04/20 0423 11/05/20 0315 11/06/20 0027 11/07/20 0200  NA 137 137 137 136 138 137 133*  K 4.2 4.5 4.6 4.0 4.3 4.6 4.2  CL 99 100 100 97* 101 101 97*  CO2 23 20* 20* 22 20* 17* 16*  GLUCOSE 93 180* 89 93 60* 123* 117*  BUN 69* 81* 95* 65* 88* 110* 135*  CREATININE 4.98* 5.88* 6.55* 4.78* 6.07* 7.26* 8.52*  CALCIUM 7.6* 7.7* 7.8* 7.8* 7.8* 7.8* 7.8*  PHOS 8.4* 9.2* 10.8* 8.3* 9.8* 10.6*  --    CBC Recent Labs  Lab 11/06/20 0027 11/06/20 1007 11/07/20 0200 11/08/20 0223  WBC 39.3* 46.6* 41.4* 42.3*  HGB 8.1* 8.4*  7.8* 7.4*  HCT 25.0* 24.4* 23.6* 22.0*  MCV 89.3 88.4 89.4 87.6  PLT 75* 84* 86* 92*    Medications:    . sodium chloride   Intravenous Once  . sodium chloride   Intravenous Once  . acetaminophen (TYLENOL) oral liquid 160 mg/5 mL  650 mg Oral Q6H  .  amiodarone  400 mg Per Tube BID  . B-complex with vitamin C  1 tablet Per Tube Daily  . Chlorhexidine Gluconate Cloth  6 each Topical Q1200  . Chlorhexidine Gluconate Cloth  6 each Topical Q0600  . darbepoetin (ARANESP) injection - DIALYSIS  100 mcg Intravenous Q Tue-HD  . [START ON 11/18/2020] darbepoetin (ARANESP) injection - DIALYSIS  60 mcg Intravenous Q Sat-HD  . dextrose  1 ampule Intravenous Once  . feeding supplement (NEPRO CARB STEADY)  237 mL Oral TID BM  . Gerhardt's butt cream   Topical TID  . insulin aspart  3-9 Units Subcutaneous Q4H  . lactobacillus acidophilus  2 tablet Oral TID  . lidocaine  1 patch Transdermal Q24H  . mouth rinse  15 mL Mouth Rinse BID  . phenol  1 spray Mouth/Throat Once  . predniSONE  40 mg Per Tube Q breakfast  . sodium chloride flush  10-40 mL Intracatheter Q12H  . sodium chloride flush  3 mL Intravenous Q12H  . sucralfate  1 g Oral TID WC & HS  . triamcinolone 0.1 % cream : eucerin   Topical BID    Justin Mend, MD  11/08/2020, 8:57 AM

## 2020-11-08 NOTE — Progress Notes (Signed)
Occupational Therapy Treatment Patient Details Name: Raymond Hurley. MRN: [id number] DOB: Apr 12, 1931 Today's Date: 11/08/2020    History of present illness Pt is an 87 y.o. male admitted 10/14/20 with hypotension, diarrhea, COVID-19. Course complicated by AMS, aspiration PNA, multiple organ failure, pancreatitis. New onset afib with RVR 1/4 and transfer to ICU. CRRT 1/4-1/12; intermittent HD initiated 1/15. PMH includes metastatic melanoma, afib, CHF.   OT comments  Pt seen for return to bed after approx 45 min sitting in chair. Pt verbalized enjoying being up OOB in recliner and demonstrated good tolerance. Return to bed to prevent too much fatigue  Since it has been approx 8 days since OOB (Pt shared that he has been doing bed level exercises on his own) mod A +2 with assist from bed pad for lift into standing, use of Stedy to return to bed. Pt continues to be motivated and eager to participate - is an excellent CIR candidate.    Follow Up Recommendations  CIR    Equipment Recommendations  3 in 1 bedside commode;Other (comment) (defer to next venue of care)    Recommendations for Other Services      Precautions / Restrictions Precautions Precautions: Fall;Other (comment) Precaution Comments: Bowel incontinence (apparently had flexiseal removed yesterday) Restrictions Weight Bearing Restrictions: No       Mobility Bed Mobility Overal bed mobility: Needs Assistance Bed Mobility: Sit to Sidelying;Rolling Rolling: Mod assist Sidelying to sit: +2 for physical assistance;Mod assist     Sit to sidelying: +2 for physical assistance;Mod assist General bed mobility comments: Assist to lower shoulders and bring legs back up in bed  Transfers Overall transfer level: Needs assistance Equipment used: Ambulation equipment used Transfers: Sit to/from Stand Sit to Stand: +2 physical assistance;Mod assist         General transfer comment: Used bed pad to bring hips up from  recliner. Incr time to rise.    Balance Overall balance assessment: Needs assistance Sitting-balance support: Feet supported;Bilateral upper extremity supported Sitting balance-Leahy Scale: Poor Sitting balance - Comments: UE support for balance   Standing balance support: Bilateral upper extremity supported Standing balance-Leahy Scale: Poor Standing balance comment: Stood with Stedy x 2 for 10 sec with +2 min to maintain                           ADL either performed or assessed with clinical judgement   ADL Overall ADL's : Needs assistance/impaired Eating/Feeding: Set up;Sitting   Grooming: Wash/dry hands;Wash/dry face;Set up;Sitting Grooming Details (indicate cue type and reason): EOB     Lower Body Bathing: Moderate assistance;Sitting/lateral leans Lower Body Bathing Details (indicate cue type and reason): assist for back     Lower Body Dressing: Total assistance;Bed level Lower Body Dressing Details (indicate cue type and reason): for socks Toilet Transfer: Moderate assistance;+2 for physical assistance;+2 for safety/equipment;BSC Toilet Transfer Details (indicate cue type and reason): initially light mod A +2 and with each sit<>stand increased assist required Toileting- Clothing Manipulation and Hygiene: Maximal assistance;+2 for physical assistance;+2 for safety/equipment;Sit to/from stand       Functional mobility during ADLs: Moderate assistance;+2 for physical assistance;+2 for safety/equipment (stedy) General ADL Comments: greatly improving and very motivated to return to [facility]     Vision   Additional Comments: at baseline - wears glasses   Perception     Praxis      Cognition Arousal/Alertness: Awake/alert Behavior During Therapy: WFL for tasks assessed/performed Overall Cognitive Status:  Within Functional Limits for tasks assessed                                          Exercises     Shoulder Instructions        General Comments VSS on RA    Pertinent Vitals/ Pain       Pain Assessment: No/denies pain Faces Pain Scale: No hurt Pain Intervention(s): Monitored during session;Repositioned  Home Living                                          Prior Functioning/Environment              Frequency  Min 2X/week        Progress Toward Goals  OT Goals(current goals can now be found in the care plan section)  Progress towards OT goals: Progressing toward goals  Acute Rehab OT Goals Patient Stated Goal: I want to get stronger OT Goal Formulation: With patient Time For Goal Achievement: 11/14/20 Potential to Achieve Goals: Good  Plan Discharge plan remains appropriate;Frequency remains appropriate    Co-evaluation    PT/OT/SLP Co-Evaluation/Treatment: Yes Reason for Co-Treatment: For patient/therapist safety;To address functional/ADL transfers PT goals addressed during session: Mobility/safety with mobility;Balance;Proper use of DME;Strengthening/ROM OT goals addressed during session: Strengthening/ROM;Proper use of Adaptive equipment and DME      AM-PAC OT "6 Clicks" Daily Activity     Outcome Measure   Help from another person eating meals?: None Help from another person taking care of personal grooming?: A Little Help from another person toileting, which includes using toliet, bedpan, or urinal?: A Lot Help from another person bathing (including washing, rinsing, drying)?: A Lot Help from another person to put on and taking off regular upper body clothing?: A Little Help from another person to put on and taking off regular lower body clothing?: Total 6 Click Score: 15    End of Session Equipment Utilized During Treatment: Other (comment) Charlaine Dalton)  OT Visit Diagnosis: Unsteadiness on feet (R26.81);Muscle weakness (generalized) (M62.81);Other abnormalities of gait and mobility (R26.89)   Activity Tolerance Patient tolerated treatment well   Patient Left  in bed;with call bell/phone within reach;with bed alarm set   Nurse Communication Mobility status;Need for lift equipment        Time: 1200-1213 OT Time Calculation (min): 13 min  Charges: OT General Charges $OT Visit: 1 Visit OT Treatments $Self Care/Home Management : 8-22 mins  Jesse Sans OTR/L Acute Rehabilitation Services Pager: [phone] Office: [place] 11/08/2020, 12:58 PM

## 2020-11-08 NOTE — TOC Progression Note (Addendum)
Transition of Care (TOC) - Progression Note    Patient Details  Name: Raymond Hurley. MRN: [id number] Date of Birth: 10-03-1931  Transition of Care [facility]) CM/SW [place], [place] Phone Number: 11/08/2020, 2:04 PM  Clinical Narrative:    Spoke with [facility] admissions regarding bed offer. CSW explained pt would have HD. Admissions explained they are unable to take any HD patients right now due to having transportation issues with their current HD patients.   CSW called Olivia Mackie with [facility] to see if they could review pt. Olivia Mackie said they cannot manage pt's HD if it were at [facility]. She was willing to review pt in the case that pt gets clipped elsewhere closer to facility. CSW resent pt in hub. Olivia Mackie called back and stated they can only accept pt's who are MWF scheduled at [facility].    Expected Discharge Plan: Skilled Nursing Facility Barriers to Discharge: SNF Pending bed offer,Waiting for outpatient dialysis,Continued Medical Work up  Expected Discharge Plan and Services Expected Discharge Plan: [place] Choice: [place] arrangements for the past 2 months: Apartment                                       Social Determinants of Health (SDOH) Interventions    Readmission Risk Interventions No flowsheet data found.

## 2020-11-08 NOTE — Progress Notes (Addendum)
Physical Therapy Treatment Patient Details Name: Raymond Hurley. MRN: [id number] DOB: 1931/09/10 Today's Date: 11/08/2020    History of Present Illness Pt is an 87 y.o. male admitted 10/14/20 with hypotension, diarrhea, COVID-19. Course complicated by AMS, aspiration PNA, multiple organ failure, pancreatitis. New onset afib with RVR 1/4 and transfer to ICU. CRRT 1/4-1/12; intermittent HD initiated 1/15. PMH includes metastatic melanoma, afib, CHF.    PT Comments    Pt much improved since last seen. Pt very motivated to return to independence. Pt now off of covid precautions and recommend CIR consult.    Follow Up Recommendations  Supervision/Assistance - 24 hour;CIR     Equipment Recommendations  Rolling walker with 5" wheels;Wheelchair (measurements PT);Wheelchair cushion (measurements PT);3in1 (PT)    Recommendations for Other Services Rehab consult     Precautions / Restrictions Precautions Precautions: Fall;Other (comment) Precaution Comments: Bowel incontinence (apparently had flexiseal removed yesterday)    Mobility  Bed Mobility Overal bed mobility: Needs Assistance Bed Mobility: Rolling;Sidelying to Sit Rolling: Mod assist Sidelying to sit: +2 for physical assistance;Mod assist       General bed mobility comments: Assist to bring hips/shoulders over. Assist to bring legs off EOB, elevate trunk into sitting, and bring hips to EOB  Transfers Overall transfer level: Needs assistance Equipment used: Ambulation equipment used Transfers: Sit to/from Stand Sit to Stand: +2 physical assistance;Mod assist;From elevated surface         General transfer comment: Assist to bring hips up. Stood x 3 with first stand requiring light +2 mod assist and incr to heavier +2 mod assist with subsequent stands. Used Stedy for bed to chair  Ambulation/Gait                 Marine scientist Rankin (Stroke Patients Only)        Balance Overall balance assessment: Needs assistance Sitting-balance support: Feet supported;Bilateral upper extremity supported Sitting balance-Leahy Scale: Poor Sitting balance - Comments: UE support for balance   Standing balance support: Bilateral upper extremity supported Standing balance-Leahy Scale: Poor Standing balance comment: Stood with Stedy for 10-20 sec with +2 min to maintain                            Cognition Arousal/Alertness: Awake/alert Behavior During Therapy: WFL for tasks assessed/performed Overall Cognitive Status: Within Functional Limits for tasks assessed                                        Exercises      General Comments General comments (skin integrity, edema, etc.): VSS on RA      Pertinent Vitals/Pain Pain Assessment: No/denies pain    Home Living                      Prior Function            PT Goals (current goals can now be found in the care plan section) Acute Rehab PT Goals Patient Stated Goal: I want to get stronger PT Goal Formulation: With patient Time For Goal Achievement: 11/22/20 Potential to Achieve Goals: Good Progress towards PT goals: Goals downgraded-see care plan    Frequency    Min 3X/week      PT Plan Frequency  needs to be updated;Discharge plan needs to be updated    Co-evaluation PT/OT/SLP Co-Evaluation/Treatment: Yes Reason for Co-Treatment: For patient/therapist safety          AM-PAC PT "6 Clicks" Mobility   Outcome Measure  Help needed turning from your back to your side while in a flat bed without using bedrails?: A Lot Help needed moving from lying on your back to sitting on the side of a flat bed without using bedrails?: A Lot Help needed moving to and from a bed to a chair (including a wheelchair)?: A Lot Help needed standing up from a chair using your arms (e.g., wheelchair or bedside chair)?: A Lot Help needed to walk in hospital room?:  Total Help needed climbing 3-5 steps with a railing? : Total 6 Click Score: 10    End of Session   Activity Tolerance: Patient tolerated treatment well Patient left: with call bell/phone within reach;in chair;with chair alarm set Nurse Communication: Mobility status;Need for lift equipment PT Visit Diagnosis: Other abnormalities of gait and mobility (R26.89);Difficulty in walking, not elsewhere classified (R26.2);Muscle weakness (generalized) (M62.81)     Time: 1660-6004 PT Time Calculation (min) (ACUTE ONLY): 26 min  Charges:  $Therapeutic Activity: 8-22 mins                     [place] Pager [phone] Office [place] 11/08/2020, 11:42 AM

## 2020-11-08 NOTE — Progress Notes (Signed)
  Speech Language Pathology Treatment: Dysphagia  Patient Details Name: Raymond Hurley. MRN: [id number] DOB: [birth year] Today's Date: 11/08/2020 Time: 1012-1029 SLP Time Calculation (min) (ACUTE ONLY): 17 min  Assessment / Plan / Recommendation Clinical Impression   Pt was originally scheduled for MBS today to evaluate oropharyngeal swallow and potential for diet advancement; however, it was noted from chart review and pt report that his diet was advanced to regular solids and thin liquids already on 1/24. Pt and his wife (who joined via video chat) both deny any coughing during meals and believe that his swallowing is back to normal now that he is starting to feel better. He consumed solids and thin liquids with skilled observation provided by SLP, with no overt s/s of aspiration and no cueing needed for safety. Education was also provided about the concern for aspiration PNA earlier this admission. Given that pt has been consuming this diet for several days, he politely declines proceeding with MBS at this time. He also does not feel that he needs ongoing SLP services. SLP to sign off acutely - please reorder if needed.    HPI HPI: Pt is an 87 y.o. male with medical history significant for atrial fibrillation on chronic anticoagulation, systolic CHF, melanoma, and vitamin B12 deficiency who presented after being found to be acutely altered with fever four days prior to admission. CXR 1/2: Stable small left pleural effusion. Left retrocardiac opacity with increased volume loss, indicating left lower lobe atelectasis with possible component of pneumonia. Found to have Raymond Hurley. MRI brain negative.      SLP Plan  All goals met       Recommendations  Diet recommendations: Regular;Thin liquid Liquids provided via: Cup;Straw Medication Administration: Whole meds with puree Supervision: Patient able to self feed;Intermittent supervision to cue for compensatory strategies Compensations: Slow  rate;Small sips/bites Postural Changes and/or Swallow Maneuvers: Seated upright 90 degrees                Oral Care Recommendations: Oral care BID Follow up Recommendations: None (for swallowing) SLP Visit Diagnosis: Dysphagia, unspecified (R13.10) Plan: All goals met       GO                Raymond Bond., M.A. Raymond Hurley Acute Rehabilitation Services Pager [phone] Office [phone]  11/08/2020, 12:20 PM

## 2020-11-08 NOTE — Care Management Important Message (Signed)
Important Message  Patient Details  Name: Raymond Hurley. MRN: [id number] Date of Birth: 09/15/1931   Medicare Important Message Given:  Yes     Shelda Altes 11/08/2020, 1:30 PM

## 2020-11-09 LAB — RENAL FUNCTION PANEL
Albumin: 2 g/dL — ABNORMAL LOW (ref 3.5–5.0)
Anion gap: 20 — ABNORMAL HIGH (ref 5–15)
BUN: 109 mg/dL — ABNORMAL HIGH (ref 8–23)
CO2: 16 mmol/L — ABNORMAL LOW (ref 22–32)
Calcium: 8.3 mg/dL — ABNORMAL LOW (ref 8.9–10.3)
Chloride: 100 mmol/L (ref 98–111)
Creatinine, Ser: 7.5 mg/dL — ABNORMAL HIGH (ref 0.61–1.24)
GFR, Estimated: 6 mL/min — ABNORMAL LOW (ref >60)
Glucose, Bld: 74 mg/dL (ref 70–99)
Phosphorus: 9.4 mg/dL — ABNORMAL HIGH (ref 2.5–4.6)
Potassium: 4.9 mmol/L (ref 3.5–5.1)
Sodium: 136 mmol/L (ref 135–145)

## 2020-11-09 LAB — CBC WITH DIFFERENTIAL/PLATELET
Abs Immature Granulocytes: 6.23 10*3/uL — ABNORMAL HIGH (ref 0.00–0.07)
Basophils Absolute: 0.1 10*3/uL (ref 0.0–0.1)
Basophils Relative: 0 %
Eosinophils Absolute: 0 10*3/uL (ref 0.0–0.5)
Eosinophils Relative: 0 %
HCT: 24.1 % — ABNORMAL LOW (ref 39.0–52.0)
Hemoglobin: 7.6 g/dL — ABNORMAL LOW (ref 13.0–17.0)
Immature Granulocytes: 16 %
Lymphocytes Relative: 1 %
Lymphs Abs: 0.5 10*3/uL — ABNORMAL LOW (ref 0.7–4.0)
MCH: 29.3 pg (ref 26.0–34.0)
MCHC: 31.5 g/dL (ref 30.0–36.0)
MCV: 93.1 fL (ref 80.0–100.0)
Monocytes Absolute: 4.8 10*3/uL — ABNORMAL HIGH (ref 0.1–1.0)
Monocytes Relative: 12 %
Neutro Abs: 27 10*3/uL — ABNORMAL HIGH (ref 1.7–7.7)
Neutrophils Relative %: 71 %
Platelets: 100 10*3/uL — ABNORMAL LOW (ref 150–400)
RBC: 2.59 MIL/uL — ABNORMAL LOW (ref 4.22–5.81)
RDW: 22.2 % — ABNORMAL HIGH (ref 11.5–15.5)
WBC: 38.6 10*3/uL — ABNORMAL HIGH (ref 4.0–10.5)
nRBC: 0.1 % (ref 0.0–0.2)

## 2020-11-09 LAB — GLUCOSE, CAPILLARY
Glucose-Capillary: 113 mg/dL — ABNORMAL HIGH (ref 70–99)
Glucose-Capillary: 154 mg/dL — ABNORMAL HIGH (ref 70–99)
Glucose-Capillary: 167 mg/dL — ABNORMAL HIGH (ref 70–99)
Glucose-Capillary: 75 mg/dL (ref 70–99)
Glucose-Capillary: 79 mg/dL (ref 70–99)
Glucose-Capillary: 94 mg/dL (ref 70–99)

## 2020-11-09 LAB — MAGNESIUM: Magnesium: 2.2 mg/dL (ref 1.7–2.4)

## 2020-11-09 MED ORDER — ALBUMIN HUMAN 25 % IV SOLN
INTRAVENOUS | Status: AC
  Administered 2020-11-09: 12.5 g
  Filled 2020-11-09: qty 100

## 2020-11-09 NOTE — TOC Progression Note (Signed)
Transition of Care (TOC) - Progression Note    Patient Details  Name: Raymond Hurley. MRN: [id number] Date of Birth: 09/04/1931  Transition of Care [facility]) CM/SW [place], [place] Phone Number: 11/09/2020, 12:13 PM  Clinical Narrative:     CSW spoke with patients spouse Webb Silversmith and son Gershon Mussel to follow up on plans for patient when medically ready for discharge. Patients spouse as well as patients son is interested in [facility] for patient. CSW reached out to [place] with CIR to follow up with patients family about possible CIR for patient.  CSW will continue to follow.    Expected Discharge Plan: Skilled Nursing Facility Barriers to Discharge: SNF Pending bed offer,Waiting for outpatient dialysis,Continued Medical Work up  Expected Discharge Plan and Services Expected Discharge Plan: [place] Choice: [place] arrangements for the past 2 months: Apartment                                       Social Determinants of Health (SDOH) Interventions    Readmission Risk Interventions No flowsheet data found.

## 2020-11-09 NOTE — Progress Notes (Signed)
Inpatient Rehab Admissions Coordinator Note:   Per updated therapy recommendations, pt was screened for CIR candidacy by Shann Medal, PT, DPT.  At this time we are recommending a CIR consult and I will place an order per our protocol.  Please contact me with questions.   Shann Medal, PT, DPT [phone] 11/09/20 12:40 PM

## 2020-11-09 NOTE — Progress Notes (Signed)
Patient has been accepted at [facility] outpatient HD on a MWF schedule with a seat time of 12:05pm. Given the limited resources in the outpatient HD setting, if patient will not be discharged within the next few days, either to home or to SNF in order to utilize this seat, we will not be able to hold it for him and Navigator will need to re-refer for outpatient HD. This was explained to patient's wife, who sounds like a great support and great advocate for her husband. She understands this, and would very much like her husband to go to Inpatient Rehab for therapy. In this case, outpatient seat would be canceled and re-referred if needed (AKI) closer to dc from CIR. Navigator will communicate with CIR AC and follow closely.   Alphonzo Cruise, [place] [facility] Navigator [phone]

## 2020-11-09 NOTE — Progress Notes (Signed)
[facility] Progress Note     Assessment/ Plan:   1. Dialysis dependent AKI 2/2 ATN. 02/2020 Cr 1.2. 1. CRRT 1/4-1/12. And now tol iHD on MWF COVID shift 2. Patient/family desire ongoing dialysis after discussion on 1/20 3. IR to place tunneled dialysis catheter, on 1/24 4. Began clip process as AKI, SW aware 5. Status post HD on 1/21 with 1.5 L UF, tolerated well but BP limited UF.  Next HD on 1/25 as a make up for missed 1/24 due to high pt census with better UF 2.4L with midodrine.  Plan next HD today.  Will try to maximize fluid removal today given persistent LE edema.  Midodrine prior.  2. Acute pancreatitis: Noted on CT scan 10/16/20 and again on 1/7. 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  3. Acute hypoxemic RF: likely aspiration PNA +/- COVID pneumonitis, now on RA. S/p speroids.  Resolved and off isolation now.   4. Shock: high WBC count, w/u per primary team. Possible ilacus abscess on CT from 1/7. Off levophed 1/11. Completed courese of Mero, zyvox. Slowly improving but WBC remains 38k.     5Acute encephalopathy: multifactorial, likely COVID + acute illness. Improved.  6.  Chronic systolic CHF: EF 0/6/23 on TTE 60-65% with no WMA.  7.  Afib: hep and amiodarone po  8. Acute anemia: GI losses + AKI/CKD. Received multiple blood transfusion.  He has a history of metastatic melanoma.  On 1/22, Dr. Carolin Sicks discussed with multiple family members via video call in the patient's room.  The family discussed with the patient's oncologist at [facility] who reportedly said no contraindication for erythropoietin to manage anemia. Being treated with IV iron and ESA .  Running in mid 7s, transfuse < 7.   9.  Thrombocytopenia:  Initially normal, into 50s now back to 100s.  No heparin with dialysis.  Further w/u per primary.    Subjective:   Scheduled for HD. Remains anuric.  Feeling good today.     Objective:   BP (!) 107/53 (BP Location: Left Arm)   Pulse 69   Temp 97.6 F (36.4 C) (Oral)   Resp 18   Ht 5\' 8"  (1.727 m)   Wt 95.7 kg   SpO2 92%   BMI 32.08 kg/m   Intake/Output Summary (Last 24 hours) at 11/09/2020 0745 Last data filed at 11/09/2020 0453 Gross per 24 hour  Intake 1517 ml  Output -  Net 1517 ml   Weight change: 0 kg  Physical Exam: GEN: Not in distress, comfortable on RA NECK: Supple, no thyromegaly LUNGS: Clear bilateral, no rhonchi CV: RRR, No M/R/G ABD: SNDNT +BS  EXT: 2+  extremity edema with some improvement c/w yest but still significant ACCESS: RIJ TDC site clean  Imaging: No results found.  Labs: BMET Recent Labs  Lab 11/02/20 1546 11/03/20 0336 11/04/20 0423 11/05/20 0315 11/06/20 0027 11/07/20 0200 11/09/20 0344  NA 137 137 136 138 137 133* 136  K 4.5 4.6 4.0 4.3 4.6 4.2 4.9  CL 100 100 97* 101 101 97* 100  CO2 20* 20* 22 20* 17* 16* 16*  GLUCOSE 180* 89 93 60* 123* 117* 74  BUN 81* 95* 65* 88* 110* 135* 109*  CREATININE 5.88* 6.55* 4.78* 6.07* 7.26* 8.52* 7.50*  CALCIUM 7.7* 7.8* 7.8* 7.8* 7.8* 7.8* 8.3*  PHOS 9.2* 10.8* 8.3* 9.8* 10.6*  --  9.4*   CBC Recent Labs  Lab 11/06/20  1007 11/07/20 0200 11/08/20 0223 11/09/20 0344  WBC 46.6* 41.4* 42.3* 38.6*  NEUTROABS  --   --   --  27.0*  HGB 8.4* 7.8* 7.4* 7.6*  HCT 24.4* 23.6* 22.0* 24.1*  MCV 88.4 89.4 87.6 93.1  PLT 84* 86* 92* 100*    Medications:    . sodium chloride   Intravenous Once  . sodium chloride   Intravenous Once  . acetaminophen (TYLENOL) oral liquid 160 mg/5 mL  650 mg Oral Q6H  . amiodarone  400 mg Per Tube BID  . B-complex with vitamin C  1 tablet Per Tube Daily  . Chlorhexidine Gluconate Cloth  6 each Topical Q1200  . Chlorhexidine Gluconate Cloth  6 each Topical Q0600  . darbepoetin (ARANESP) injection - DIALYSIS  100 mcg Intravenous Q Tue-HD  . [START ON 11/18/2020] darbepoetin (ARANESP) injection - DIALYSIS  60 mcg Intravenous Q Sat-HD   . dextrose  1 ampule Intravenous Once  . feeding supplement (NEPRO CARB STEADY)  237 mL Oral TID BM  . Gerhardt's butt cream   Topical TID  . insulin aspart  3-9 Units Subcutaneous Q4H  . lactobacillus acidophilus  2 tablet Oral TID  . lidocaine  1 patch Transdermal Q24H  . mouth rinse  15 mL Mouth Rinse BID  . midodrine  10 mg Oral Q M,W,F-HD  . phenol  1 spray Mouth/Throat Once  . predniSONE  40 mg Per Tube Q breakfast  . sodium chloride flush  10-40 mL Intracatheter Q12H  . sodium chloride flush  3 mL Intravenous Q12H  . sucralfate  1 g Oral TID WC & HS  . triamcinolone 0.1 % cream : eucerin   Topical BID    Justin Mend, MD  11/09/2020, 7:45 AM

## 2020-11-09 NOTE — Progress Notes (Signed)
Physical Therapy Treatment Patient Details Name: Raymond Hurley. MRN: [id number] DOB: 01-12-1931 Today's Date: 11/09/2020    History of Present Illness Pt is an 87 y.o. male admitted 10/14/20 with hypotension, diarrhea, COVID-19. Course complicated by AMS, aspiration PNA, multiple organ failure, pancreatitis. New onset afib with RVR 1/4 and transfer to ICU. CRRT 1/4-1/12; intermittent HD initiated 1/15. PMH includes metastatic melanoma, afib, CHF.    PT Comments    Pt continues to make progress. Able to take a couple of steps with the walker. Pt continues to be highly motivated to work toward independence. Continue to recommend CIR.    Follow Up Recommendations  CIR;Supervision/Assistance - 24 hour     Equipment Recommendations  Rolling walker with 5" wheels;Wheelchair (measurements PT);Wheelchair cushion (measurements PT);3in1 (PT)    Recommendations for Other Services Rehab consult     Precautions / Restrictions Precautions Precautions: Fall;Other (comment)    Mobility  Bed Mobility Overal bed mobility: Needs Assistance Bed Mobility: Sidelying to Sit   Sidelying to sit: Mod assist;HOB elevated       General bed mobility comments: Assist to bring legs off of bed, elevate trunk into sitting.  Transfers Overall transfer level: Needs assistance Equipment used: Ambulation equipment used;Rolling walker (2 wheeled) Transfers: Sit to/from Stand Sit to Stand: +2 physical assistance;Mod assist;Min assist         General transfer comment: +2 min assist to stand with Stedy from elevate surface. Use of bed pad to bring hips up. +2 mod assist to bring hips up from low recliner and using walker. Bed to chair with [facility]  Ambulation/Gait Ambulation/Gait assistance: +2 physical assistance;Mod assist Gait Distance (Feet): 1 Feet Assistive device: Rolling walker (2 wheeled) Gait Pattern/deviations: Step-to pattern;Decreased step length - right;Decreased step length -  left;Leaning posteriorly (knee hyperextension) Gait velocity: decr Gait velocity interpretation: <1.31 ft/sec, indicative of household ambulator General Gait Details: Assist for balance and support.   Stairs             Wheelchair Mobility    Modified Rankin (Stroke Patients Only)       Balance Overall balance assessment: Needs assistance Sitting-balance support: Feet supported;Bilateral upper extremity supported Sitting balance-Leahy Scale: Poor Sitting balance - Comments: UE support for balance   Standing balance support: Bilateral upper extremity supported Standing balance-Leahy Scale: Poor Standing balance comment: Static standing with Stedy or walker with +2 min assist. Stood x 2 with Stedy x 30-40 sec. Stood x 1 with walker                            Cognition Arousal/Alertness: Awake/alert Behavior During Therapy: WFL for tasks assessed/performed Overall Cognitive Status: Within Functional Limits for tasks assessed                                        Exercises      General Comments        Pertinent Vitals/Pain Pain Assessment: No/denies pain    Home Living                      Prior Function            PT Goals (current goals can now be found in the care plan section) Acute Rehab PT Goals Patient Stated Goal: I want to get stronger Progress towards PT goals: Progressing toward  goals    Frequency    Min 3X/week      PT Plan Current plan remains appropriate    Co-evaluation              AM-PAC PT "6 Clicks" Mobility   Outcome Measure  Help needed turning from your back to your side while in a flat bed without using bedrails?: A Lot Help needed moving from lying on your back to sitting on the side of a flat bed without using bedrails?: A Lot Help needed moving to and from a bed to a chair (including a wheelchair)?: Total Help needed standing up from a chair using your arms (e.g., wheelchair  or bedside chair)?: A Lot Help needed to walk in hospital room?: Total Help needed climbing 3-5 steps with a railing? : Total 6 Click Score: 9    End of Session   Activity Tolerance: Patient tolerated treatment well Patient left: in chair;with call bell/phone within reach;with chair alarm set Nurse Communication: Mobility status;Need for lift equipment PT Visit Diagnosis: Other abnormalities of gait and mobility (R26.89);Difficulty in walking, not elsewhere classified (R26.2);Muscle weakness (generalized) (M62.81)     Time: 2831-5176 PT Time Calculation (min) (ACUTE ONLY): 25 min  Charges:  $Gait Training: 23-37 mins                     [place] Pager [phone] Office [place] 11/09/2020, 1:58 PM

## 2020-11-09 NOTE — Progress Notes (Signed)
PROGRESS NOTE    Raymond Hurley.  GYI:[id number] DOB: 1930-12-05 DOA: 10/14/2020 PCP: Lajean Manes, MD   Brief Narrative:   87 year old man who is hypotensive in the context of acute diarrhea on 1/1 was admitted w/ dx of COVID and acute dehydration from acute diarrheal illness. Of note he was fully vaccinated and had received his booster. Given worsening delirium, multiple organ failure, and profound hypotension with CT abdomen showing pancreatitis. Given advanced age and multiple organ dysfunction in addition to concern about further clinical decline patient was transitioned to critical care team.  1/25: c/o diarrhea ON. Received imodium and improved.    Assessment & Plan: Acute blood loss anemia to ongoing GI bleed requiring multiple transfusions; Concurrent anemia of kidney disease, profound dehydration and acute illness - multiple transfusions during this stay - he has had heme-positive stools, but is not a candidate for endoscopy currently given stable labs and high risk for procedure. - Nephrology started Epo and iron Hemoglobin slightly dropped to 7.6 today.  Monitor daily.  Acute hypoxic respiratory failure 2/2 aspiration PNA, resolved Cannot rule out concurrent/resolving COVID-19 infection  - good sats on RA - continue ambulation, IS, FV - continue prednisone 40mg  qday; wean as able     - 1/25: resp status is good. Has borderline BP today. Give small amount of fluids. Keep steroids at current dosing today  Diarrhea Resolved.  Septic shock, resolved Possible iliac abscess - case d/w IR/CCS on 1/7, no surgery or drain indicated - Meropenem/linezolid completed - follow for fever curve/symptoms. Will likely need to involve ID if concern for worsening/re-infection given profound antibiotic use.     - 1/25: rpt CT shows stable thickening of the right iliacus muscle, but increased thickening of the rigth psoas muscle; no definitive  abscess seen; follow for now  Acute metabolic encephalopathy, multifactorial: In the setting of uremia, septic shock, pancreatitis, anemia and COVID-19 infection; Resolved - continue supportive care, delirium precautions     - resolved  Acute renal failure requiring HD Hypophosphatemia, mild - Suspect secondary to ATN after recent diarrheal illness and volume depletion in the setting of profound disease, septic shock, COVID infection - Nephrology following - CRRT clotted off 10/25/20 - HD MWF ongoing as tolerated     - 1/25: s/p TDC placement; nephro working out Outpt HD spot  Severe acute pancreatitis - continue pain control, supportive care - NG tube/feeds removed 10/28/20 - tolerate PO well, passed SLP eval - Rectal tube removed     - 1/25: Rpt CT ab/pelvis shows possible early pseudocyst formation     - previously eval'd by GI (10/16/20) and rec'd MRCP w/ contrast after acute kidney injury has resolved; 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 fib over the hospitalization  - Heparin drip stopped due to hematochezia 1/16 - continue amiodarone  Goals of care discussion  - At this point family and patient continued to impress upon the medical staff they would like to continue aggressive med care including hemodialysis, blood transfusions and procedures as indicated - Patient and family have agreed against intubation, CPR; he is DNR  DVT prophylaxis: SCDs Code Status: DNR Family Communication: None at bedside.  Had a video call with his wife and his daughter while I was seeing him. Status is: Inpatient  Remains inpatient appropriate  because:Inpatient level of care appropriate due to severity of illness   Dispo: The patient is from: Home               Anticipated d/c is to: SNF              Anticipated d/c date is: 3 days              Patient currently is medically stable to d/c.  Waiting for SNF placement with outpatient dialysis   Difficult to place patient YES  Consultants:   Nephrology  PCCM  GI  Cardiology  Neurology  Palliative Care  Subjective: Patient seen and examined.  He has no complaints.  Objective: Vitals:   11/09/20 0050 11/09/20 0421 11/09/20 0453 11/09/20 0849  BP: (!) 109/59 (!) 107/53  134/70  Pulse: 72 69  80  Resp: 17 18  18   Temp: 97.8 F (36.6 C) 97.6 F (36.4 C)  97.6 F (36.4 C)  TempSrc: Axillary Oral  Oral  SpO2: 95% 92%  95%  Weight:   95.7 kg   Height:        Intake/Output Summary (Last 24 hours) at 11/09/2020 1159 Last data filed at 11/09/2020 0858 Gross per 24 hour  Intake 1400 ml  Output --  Net 1400 ml   Filed Weights   11/07/20 1721 11/08/20 0411 11/09/20 0453  Weight: 93.3 kg 92.8 kg 95.7 kg    Examination:  General exam: Appears calm and comfortable  Respiratory system: Clear to auscultation. Respiratory effort normal. Cardiovascular system: S1 & S2 heard, RRR. No JVD, murmurs, rubs, gallops or clicks. No pedal edema. Gastrointestinal system: Abdomen is nondistended, soft and nontender. No organomegaly or masses felt. Normal bowel sounds heard. Central nervous system: Alert and oriented. No focal neurological deficits. Extremities: Symmetric 5 x 5 power. Skin: No rashes, lesions or ulcers.  Psychiatry: Judgement and insight appear normal. Mood & affect appropriate.    Data Reviewed: I have personally reviewed following labs and imaging studies.  CBC: Recent Labs  Lab 11/06/20 0027 11/06/20 1007 11/07/20 0200 11/08/20 0223 11/09/20 0344  WBC 39.3* 46.6* 41.4* 42.3* 38.6*  NEUTROABS  --   --   --   --  27.0*  HGB 8.1* 8.4* 7.8* 7.4* 7.6*  HCT 25.0* 24.4* 23.6* 22.0* 24.1*  MCV 89.3 88.4 89.4 87.6 93.1  PLT 75* 84* 86* 92* 263*   Basic Metabolic  Panel: Recent Labs  Lab 11/03/20 0336 11/04/20 0423 11/05/20 0315 11/06/20 0027 11/07/20 0200 11/08/20 0223 11/09/20 0344  NA 137 136 138 137 133*  --  136  K 4.6 4.0 4.3 4.6 4.2  --  4.9  CL 100 97* 101 101 97*  --  100  CO2 20* 22 20* 17* 16*  --  16*  GLUCOSE 89 93 60* 123* 117*  --  74  BUN 95* 65* 88* 110* 135*  --  109*  CREATININE 6.55* 4.78* 6.07* 7.26* 8.52*  --  7.50*  CALCIUM 7.8* 7.8* 7.8* 7.8* 7.8*  --  8.3*  MG 2.3 2.0 2.1 2.2 2.3 2.0 2.2  PHOS 10.8* 8.3* 9.8* 10.6*  --   --  9.4*   GFR: Estimated Creatinine Clearance: 7.8 mL/min (A) (by C-G formula based on SCr of 7.5 mg/dL (H)). Liver Function Tests: Recent Labs  Lab 11/05/20 0315 11/06/20 0027 11/06/20 1007 11/07/20 0200 11/09/20 0344  AST  --   --  32 26  --   ALT  --   --  25 21  --   ALKPHOS  --   --  125 109  --   BILITOT  --   --  1.1 1.0  --   PROT  --   --  4.9* 4.5*  --   ALBUMIN 1.7* 1.8* 2.0* 1.7* 2.0*   No results for input(s): LIPASE, AMYLASE in the last 168 hours. No results for input(s): AMMONIA in the last 168 hours. Coagulation Profile: No results for input(s): INR, PROTIME in the last 168 hours. Cardiac Enzymes: No results for input(s): CKTOTAL, CKMB, CKMBINDEX, TROPONINI in the last 168 hours. BNP (last 3 results) No results for input(s): PROBNP in the last 8760 hours. HbA1C: No results for input(s): HGBA1C in the last 72 hours. CBG: Recent Labs  Lab 11/08/20 1200 11/08/20 1610 11/08/20 2117 11/09/20 0436 11/09/20 0741  GLUCAP 105* 136* 161* 75 79   Lipid Profile: No results for input(s): CHOL, HDL, LDLCALC, TRIG, CHOLHDL, LDLDIRECT in the last 72 hours. Thyroid Function Tests: No results for input(s): TSH, T4TOTAL, FREET4, T3FREE, THYROIDAB in the last 72 hours. Anemia Panel: No results for input(s): VITAMINB12, FOLATE, FERRITIN, TIBC, IRON, RETICCTPCT in the last 72 hours. Sepsis Labs: No results for input(s): PROCALCITON, LATICACIDVEN in the last 168 hours.  No  results found for this or any previous visit (from the past 240 hour(s)).    Radiology Studies: No results found.   Scheduled Meds: . sodium chloride   Intravenous Once  . sodium chloride   Intravenous Once  . acetaminophen (TYLENOL) oral liquid 160 mg/5 mL  650 mg Oral Q6H  . amiodarone  400 mg Per Tube BID  . B-complex with vitamin C  1 tablet Per Tube Daily  . Chlorhexidine Gluconate Cloth  6 each Topical Q1200  . Chlorhexidine Gluconate Cloth  6 each Topical Q0600  . darbepoetin (ARANESP) injection - DIALYSIS  100 mcg Intravenous Q Tue-HD  . [START ON 11/18/2020] darbepoetin (ARANESP) injection - DIALYSIS  60 mcg Intravenous Q Sat-HD  . dextrose  1 ampule Intravenous Once  . feeding supplement (NEPRO CARB STEADY)  237 mL Oral TID BM  . Gerhardt's butt cream   Topical TID  . insulin aspart  3-9 Units Subcutaneous Q4H  . lactobacillus acidophilus  2 tablet Oral TID  . lidocaine  1 patch Transdermal Q24H  . mouth rinse  15 mL Mouth Rinse BID  . midodrine  10 mg Oral Q M,W,F-HD  . phenol  1 spray Mouth/Throat Once  . predniSONE  40 mg Per Tube Q breakfast  . sodium chloride flush  10-40 mL Intracatheter Q12H  . sodium chloride flush  3 mL Intravenous Q12H  . sucralfate  1 g Oral TID WC & HS  . triamcinolone 0.1 % cream : eucerin   Topical BID   Continuous Infusions: . sodium chloride Stopped (10/27/20 0931)     LOS: 26 days    Time spent: 28 minutes   Darliss Cheney, MD [facility]  If 7PM-7AM, please contact night-coverage [email or web address] 11/09/2020, 11:59 AM

## 2020-11-10 ENCOUNTER — Encounter (HOSPITAL_COMMUNITY): Payer: Self-pay | Admitting: Physical Medicine & Rehabilitation

## 2020-11-10 ENCOUNTER — Inpatient Hospital Stay (HOSPITAL_COMMUNITY)
Admission: RE | Admit: 2020-11-10 | Discharge: 2020-12-07 | DRG: 945 | Disposition: A | Discharge status: Home Health | Payer: Medicare Other | Source: Intra-hospital | Attending: Physical Medicine & Rehabilitation | Admitting: Physical Medicine & Rehabilitation

## 2020-11-10 ENCOUNTER — Other Ambulatory Visit: Payer: Self-pay

## 2020-11-10 ENCOUNTER — Encounter (HOSPITAL_COMMUNITY): Payer: Self-pay | Admitting: Internal Medicine

## 2020-11-10 DIAGNOSIS — U071 COVID-19: Secondary | ICD-10-CM | POA: Diagnosis not present

## 2020-11-10 DIAGNOSIS — R1310 Dysphagia, unspecified: Secondary | ICD-10-CM | POA: Diagnosis not present

## 2020-11-10 DIAGNOSIS — N2581 Secondary hyperparathyroidism of renal origin: Secondary | ICD-10-CM | POA: Diagnosis present

## 2020-11-10 DIAGNOSIS — D649 Anemia, unspecified: Secondary | ICD-10-CM

## 2020-11-10 DIAGNOSIS — T380X5A Adverse effect of glucocorticoids and synthetic analogues, initial encounter: Secondary | ICD-10-CM

## 2020-11-10 DIAGNOSIS — I953 Hypotension of hemodialysis: Secondary | ICD-10-CM | POA: Diagnosis not present

## 2020-11-10 DIAGNOSIS — D696 Thrombocytopenia, unspecified: Secondary | ICD-10-CM

## 2020-11-10 DIAGNOSIS — Z8616 Personal history of COVID-19: Secondary | ICD-10-CM | POA: Diagnosis not present

## 2020-11-10 DIAGNOSIS — N170 Acute kidney failure with tubular necrosis: Secondary | ICD-10-CM | POA: Diagnosis present

## 2020-11-10 DIAGNOSIS — E43 Unspecified severe protein-calorie malnutrition: Secondary | ICD-10-CM | POA: Insufficient documentation

## 2020-11-10 DIAGNOSIS — C7889 Secondary malignant neoplasm of other digestive organs: Secondary | ICD-10-CM | POA: Diagnosis present

## 2020-11-10 DIAGNOSIS — R42 Dizziness and giddiness: Secondary | ICD-10-CM

## 2020-11-10 DIAGNOSIS — N179 Acute kidney failure, unspecified: Secondary | ICD-10-CM | POA: Diagnosis not present

## 2020-11-10 DIAGNOSIS — L89310 Pressure ulcer of right buttock, unstageable: Secondary | ICD-10-CM | POA: Diagnosis present

## 2020-11-10 DIAGNOSIS — I428 Other cardiomyopathies: Secondary | ICD-10-CM

## 2020-11-10 DIAGNOSIS — R001 Bradycardia, unspecified: Secondary | ICD-10-CM | POA: Diagnosis not present

## 2020-11-10 DIAGNOSIS — N186 End stage renal disease: Secondary | ICD-10-CM | POA: Diagnosis present

## 2020-11-10 DIAGNOSIS — Z515 Encounter for palliative care: Secondary | ICD-10-CM

## 2020-11-10 DIAGNOSIS — L89153 Pressure ulcer of sacral region, stage 3: Secondary | ICD-10-CM | POA: Diagnosis present

## 2020-11-10 DIAGNOSIS — Z85820 Personal history of malignant melanoma of skin: Secondary | ICD-10-CM

## 2020-11-10 DIAGNOSIS — J1282 Pneumonia due to coronavirus disease 2019: Secondary | ICD-10-CM | POA: Diagnosis not present

## 2020-11-10 DIAGNOSIS — R5381 Other malaise: Secondary | ICD-10-CM

## 2020-11-10 DIAGNOSIS — D539 Nutritional anemia, unspecified: Secondary | ICD-10-CM | POA: Diagnosis not present

## 2020-11-10 DIAGNOSIS — Z6837 Body mass index (BMI) 37.0-37.9, adult: Secondary | ICD-10-CM

## 2020-11-10 DIAGNOSIS — R34 Anuria and oliguria: Secondary | ICD-10-CM | POA: Diagnosis present

## 2020-11-10 DIAGNOSIS — D72828 Other elevated white blood cell count: Secondary | ICD-10-CM | POA: Diagnosis present

## 2020-11-10 DIAGNOSIS — Z992 Dependence on renal dialysis: Secondary | ICD-10-CM | POA: Diagnosis not present

## 2020-11-10 DIAGNOSIS — I5022 Chronic systolic (congestive) heart failure: Secondary | ICD-10-CM

## 2020-11-10 DIAGNOSIS — I959 Hypotension, unspecified: Secondary | ICD-10-CM | POA: Diagnosis not present

## 2020-11-10 DIAGNOSIS — R739 Hyperglycemia, unspecified: Secondary | ICD-10-CM | POA: Diagnosis present

## 2020-11-10 DIAGNOSIS — K5901 Slow transit constipation: Secondary | ICD-10-CM | POA: Diagnosis present

## 2020-11-10 DIAGNOSIS — E869 Volume depletion, unspecified: Secondary | ICD-10-CM | POA: Diagnosis not present

## 2020-11-10 DIAGNOSIS — R531 Weakness: Secondary | ICD-10-CM | POA: Diagnosis not present

## 2020-11-10 DIAGNOSIS — D6959 Other secondary thrombocytopenia: Secondary | ICD-10-CM | POA: Diagnosis present

## 2020-11-10 DIAGNOSIS — K859 Acute pancreatitis without necrosis or infection, unspecified: Secondary | ICD-10-CM

## 2020-11-10 DIAGNOSIS — R197 Diarrhea, unspecified: Secondary | ICD-10-CM | POA: Diagnosis not present

## 2020-11-10 DIAGNOSIS — D72829 Elevated white blood cell count, unspecified: Secondary | ICD-10-CM

## 2020-11-10 DIAGNOSIS — D6489 Other specified anemias: Secondary | ICD-10-CM | POA: Diagnosis present

## 2020-11-10 DIAGNOSIS — I480 Paroxysmal atrial fibrillation: Secondary | ICD-10-CM | POA: Diagnosis present

## 2020-11-10 DIAGNOSIS — R579 Shock, unspecified: Secondary | ICD-10-CM | POA: Diagnosis not present

## 2020-11-10 DIAGNOSIS — E860 Dehydration: Secondary | ICD-10-CM

## 2020-11-10 DIAGNOSIS — J9601 Acute respiratory failure with hypoxia: Secondary | ICD-10-CM | POA: Diagnosis not present

## 2020-11-10 DIAGNOSIS — Z79899 Other long term (current) drug therapy: Secondary | ICD-10-CM

## 2020-11-10 DIAGNOSIS — I2510 Atherosclerotic heart disease of native coronary artery without angina pectoris: Secondary | ICD-10-CM | POA: Diagnosis present

## 2020-11-10 DIAGNOSIS — E871 Hypo-osmolality and hyponatremia: Secondary | ICD-10-CM | POA: Diagnosis not present

## 2020-11-10 HISTORY — DX: Acute pancreatitis without necrosis or infection, unspecified: K85.90

## 2020-11-10 LAB — GLUCOSE, CAPILLARY
Glucose-Capillary: 74 mg/dL (ref 70–99)
Glucose-Capillary: 90 mg/dL (ref 70–99)
Glucose-Capillary: 121 mg/dL — ABNORMAL HIGH (ref 70–99)
Glucose-Capillary: 155 mg/dL — ABNORMAL HIGH (ref 70–99)
Glucose-Capillary: 170 mg/dL — ABNORMAL HIGH (ref 70–99)

## 2020-11-10 LAB — MAGNESIUM: Magnesium: 2.1 mg/dL (ref 1.7–2.4)

## 2020-11-10 LAB — PARATHYROID HORMONE, INTACT (NO CA): PTH: 217 pg/mL — ABNORMAL HIGH (ref 15–65)

## 2020-11-10 LAB — PATHOLOGIST SMEAR REVIEW

## 2020-11-10 MED ORDER — RESOURCE THICKENUP CLEAR PO POWD
ORAL | Status: DC | PRN
  Filled 2020-11-10: qty 125

## 2020-11-10 MED ORDER — INSULIN ASPART 100 UNIT/ML ~~LOC~~ SOLN
3.0000 [IU] | SUBCUTANEOUS | Status: DC
  Administered 2020-11-10: 6 [IU] via SUBCUTANEOUS
  Administered 2020-11-11: 3 [IU] via SUBCUTANEOUS
  Administered 2020-11-12: 6 [IU] via SUBCUTANEOUS
  Administered 2020-11-12 – 2020-11-13 (×3): 3 [IU] via SUBCUTANEOUS
  Administered 2020-11-13: 6 [IU] via SUBCUTANEOUS

## 2020-11-10 MED ORDER — ACETAMINOPHEN 160 MG/5ML PO SOLN
650.0000 mg | Freq: Four times a day (QID) | ORAL | Status: DC
  Administered 2020-11-11 – 2020-11-19 (×35): 650 mg via ORAL
  Filled 2020-11-10 (×39): qty 20.3

## 2020-11-10 MED ORDER — LOPERAMIDE HCL 2 MG PO CAPS
4.0000 mg | ORAL_CAPSULE | Freq: Four times a day (QID) | ORAL | Status: DC | PRN
  Administered 2020-11-10 – 2020-11-11 (×2): 4 mg via ORAL
  Filled 2020-11-10 (×2): qty 2

## 2020-11-10 MED ORDER — GERHARDT'S BUTT CREAM
TOPICAL_CREAM | Freq: Three times a day (TID) | CUTANEOUS | Status: DC
  Administered 2020-11-17 – 2020-11-29 (×6): 1 via TOPICAL
  Filled 2020-11-10: qty 1

## 2020-11-10 MED ORDER — NEPRO/CARBSTEADY PO LIQD
237.0000 mL | Freq: Three times a day (TID) | ORAL | Status: DC
  Administered 2020-11-10 – 2020-12-07 (×40): 237 mL via ORAL

## 2020-11-10 MED ORDER — CHLORHEXIDINE GLUCONATE CLOTH 2 % EX PADS: 6.0000 | MEDICATED_PAD | Freq: Every day | CUTANEOUS | Status: DC

## 2020-11-10 MED ORDER — FLEET ENEMA 7-19 GM/118ML RE ENEM
1.0000 | ENEMA | Freq: Once | RECTAL | Status: DC | PRN
Start: 2020-11-10 — End: 2020-11-13

## 2020-11-10 MED ORDER — B COMPLEX-C PO TABS
1.0000 | ORAL_TABLET | Freq: Every day | ORAL | Status: DC
  Administered 2020-11-11 – 2020-11-13 (×3): 1
  Filled 2020-11-10 (×3): qty 1

## 2020-11-10 MED ORDER — BISACODYL 10 MG RE SUPP: 10.0000 mg | Freq: Every day | RECTAL | Status: DC | PRN

## 2020-11-10 MED ORDER — TRIAMCINOLONE 0.1 % CREAM:EUCERIN CREAM 1:1
TOPICAL_CREAM | Freq: Two times a day (BID) | CUTANEOUS | Status: DC
  Administered 2020-11-11 – 2020-11-29 (×2): 1 via TOPICAL
  Filled 2020-11-10 (×2): qty 1

## 2020-11-10 MED ORDER — CALCIUM CARBONATE ANTACID 500 MG PO CHEW: 1.0000 | CHEWABLE_TABLET | Freq: Two times a day (BID) | ORAL | Status: DC | PRN

## 2020-11-10 MED ORDER — PREDNISONE 20 MG PO TABS
40.0000 mg | ORAL_TABLET | Freq: Every day | ORAL | Status: DC
  Administered 2020-11-11 – 2020-11-13 (×3): 40 mg
  Filled 2020-11-10 (×3): qty 2

## 2020-11-10 MED ORDER — LIDOCAINE 5 % EX PTCH
1.0000 | MEDICATED_PATCH | CUTANEOUS | Status: DC
  Filled 2020-11-10 (×10): qty 1

## 2020-11-10 MED ORDER — SENNOSIDES-DOCUSATE SODIUM 8.6-50 MG PO TABS: 1.0000 | ORAL_TABLET | Freq: Every evening | ORAL | Status: DC | PRN

## 2020-11-10 MED ORDER — ACETAMINOPHEN 325 MG PO TABS
325.0000 mg | ORAL_TABLET | ORAL | Status: DC | PRN
  Administered 2020-11-10 – 2020-11-29 (×2): 650 mg via ORAL
  Filled 2020-11-10 (×3): qty 2

## 2020-11-10 MED ORDER — PROCHLORPERAZINE EDISYLATE 10 MG/2ML IJ SOLN: 5.0000 mg | Freq: Four times a day (QID) | INTRAMUSCULAR | Status: DC | PRN

## 2020-11-10 MED ORDER — DIPHENHYDRAMINE HCL 12.5 MG/5ML PO ELIX: 12.5000 mg | ORAL_SOLUTION | Freq: Four times a day (QID) | ORAL | Status: DC | PRN

## 2020-11-10 MED ORDER — BACID PO TABS
2.0000 | ORAL_TABLET | Freq: Three times a day (TID) | ORAL | Status: DC
  Administered 2020-11-10 – 2020-12-01 (×57): 2 via ORAL
  Filled 2020-11-10 (×65): qty 2

## 2020-11-10 MED ORDER — SUCRALFATE 1 GM/10ML PO SUSP
1.0000 g | Freq: Three times a day (TID) | ORAL | Status: DC
  Administered 2020-11-10 – 2020-12-07 (×100): 1 g via ORAL
  Filled 2020-11-10 (×110): qty 10

## 2020-11-10 MED ORDER — AMIODARONE HCL 200 MG PO TABS: 200.0000 mg | ORAL_TABLET | Freq: Every day | ORAL | Status: DC

## 2020-11-10 MED ORDER — PROCHLORPERAZINE MALEATE 5 MG PO TABS: 5.0000 mg | ORAL_TABLET | Freq: Four times a day (QID) | ORAL | Status: DC | PRN

## 2020-11-10 MED ORDER — AMIODARONE HCL 200 MG PO TABS
400.0000 mg | ORAL_TABLET | Freq: Two times a day (BID) | ORAL | Status: DC
  Administered 2020-11-10 – 2020-11-13 (×6): 400 mg
  Filled 2020-11-10 (×6): qty 2

## 2020-11-10 MED ORDER — TRAZODONE HCL 50 MG PO TABS
25.0000 mg | ORAL_TABLET | Freq: Every evening | ORAL | Status: DC | PRN
  Administered 2020-11-13: 50 mg via ORAL
  Filled 2020-11-10: qty 1

## 2020-11-10 MED ORDER — CHLORHEXIDINE GLUCONATE CLOTH 2 % EX PADS
6.0000 | MEDICATED_PAD | Freq: Every day | CUTANEOUS | Status: DC
  Administered 2020-11-11 – 2020-11-13 (×3): 6 via TOPICAL

## 2020-11-10 MED ORDER — PROCHLORPERAZINE 25 MG RE SUPP: 12.5000 mg | Freq: Four times a day (QID) | RECTAL | Status: DC | PRN

## 2020-11-10 MED ORDER — MIDODRINE HCL 5 MG PO TABS
10.0000 mg | ORAL_TABLET | ORAL | Status: DC
  Filled 2020-11-10: qty 2

## 2020-11-10 MED ORDER — HYDROCOD POLST-CPM POLST ER 10-8 MG/5ML PO SUER: 5.0000 mL | Freq: Two times a day (BID) | ORAL | Status: DC | PRN

## 2020-11-10 MED ORDER — MIDODRINE HCL 10 MG PO TABS: 10.0000 mg | ORAL_TABLET | ORAL | Status: DC

## 2020-11-10 MED ORDER — ORAL CARE MOUTH RINSE
15.0000 mL | Freq: Two times a day (BID) | OROMUCOSAL | Status: DC
  Administered 2020-11-10 – 2020-12-07 (×42): 15 mL via OROMUCOSAL

## 2020-11-10 MED ORDER — PREDNISONE 20 MG PO TABS: 40.0000 mg | ORAL_TABLET | Freq: Every day | ORAL | Status: DC

## 2020-11-10 NOTE — Discharge Summary (Signed)
Physician Discharge Summary  Raymond Hurley. SWN:[id number] DOB: August 14, 1931 DOA: 10/14/2020  PCP: Lajean Manes, MD  Admit date: 10/14/2020 Discharge date: 11/10/2020  Admitted From: home Disposition:  CIR  Recommendations for Outpatient Follow-up:  1. Follow up with PCP in 1-2 weeks 2. Follow-up with cardiology in 1 to 2 weeks 3. Continue dialysis per nephrology  Home Health: none Equipment/Devices: none  Discharge Condition: stable CODE STATUS: DNR Diet recommendation: regular  HPI: Per admitting MD, Raymond Hurley. is a 87 y.o. male with medical history significant of atrial fibrillation on chronic anticoagulation, systolic congestive heart failure last EF 45%, melanoma, and vitamin B12 deficiency presents after being found to be acutely altered.  History is obtained from the patient who is more lucid at this time as well as his wife over the phone.  Approximately 4 days ago patient had developed subjective fever and reported to have a cough.  They were out of town at the time and he had went and got checked and was found to be positive for COVID-19.   They came back immediately to [facility]. Patient received both Moderna COVID-19 vaccines and booster. Reports that he got his booster about 4 months ago. During this time patient was complaining of severe abdominal pain and had been constipated he had taken MiraLAX once.  He soon thereafter had developed severe explosive diarrhea.  Patient wife notes that he was up and down throughout the course of the day with just watery diarrhea.  She had encouraged him to keep himself hydrated, but he had not been able to keep up.  Due to the massive bouts of diarrhea he was sleeping and chair close to the bathroom.  This morning when she went to check on him around 12:30 AM and noticed that he was talking gibberish.  She noted that he appeared spaced out and asked him if he knew who she was, but states that it took a while for him to say her  name.  She immediately called EMS.  Upon their arrival wife notes that they appreciated some left-sided facial droop, but prior documentation reports right-sided facial droop.  Hospital Course / Discharge diagnoses: Principal Problem Acute renal failure, currently on HD -Suspect secondary to ATN after septic shock, nephrology consulted and currently on dialysis.  Status post [facility] placement on 1/25, currently on HD MWF  Active Problems Septic shock, resolved, possible iliac abscess -Case discussed on 1/7 with IR/surgery, no surgery or drain indicated.  He has completed meropenem and linezolid, follow fever curve / symptoms -Repeat CT on 1/25 shows stable thickening of the right iliacus muscle, increased thickening of the right psoas muscle without definite abscess seen.  Monitor off antibiotics  COVID-19, acute hypoxic respiratory failure -Resolved, now off precautions  Acute metabolic encephalopathy, multifactorial -Resolved  Acute pancreatitis -Treated conservatively, initially had an NG tube that has been discontinued and currently eating well.  Repeat CT abdomen pelvis on 1/25 showed possible early pseudocyst formation.  Evaluated by GI and recommended MRCP with contrast after acute kidney injury has resolved but currently is on dialysis and not appropriate for MRI  Paroxysmal A. Fib, chronic diastolic CHF -Echo done on 1/3 showed an EF of 55-60%, in and out of A. fib, he was initially on heparin but this has been stopped due to hematochezia on 1/16.  Continue amiodarone, has received several days of high-dose, decreased to 200 daily, outpatient follow-up with cardiology.  Hold beta-blocker, Entresto due to hypotension/ESRD, consider resumption as  an outpatient  Hematochezia, concern for GI bleed requiring multiple transfusion, acute blood loss anemia -GI consulted, not a candidate for endoscopy given stable labs and high risk procedure.  Monitor hemoglobin.  Off  anticoagulation  Discharge Instructions   Allergies as of 11/10/2020      Reactions   Povidone-iodine Swelling   Tape Swelling   Bee Venom Rash   Covid-19 Mrna Vacc (moderna) Hives, Rash   Rash came after booster shot. Pt was fine with 1st and 2nd      Medication List    STOP taking these medications   Eliquis 5 MG Tabs tablet Generic drug: apixaban   Entresto 24-26 MG Generic drug: sacubitril-valsartan   metoprolol succinate 25 MG 24 hr tablet Commonly known as: TOPROL-XL   montelukast 10 MG tablet Commonly known as: SINGULAIR     TAKE these medications   acetaminophen 500 MG tablet Commonly known as: TYLENOL Take 1,000 mg by mouth every 6 (six) hours as needed for mild pain or headache.   amiodarone 200 MG tablet Commonly known as: Pacerone Take 1 tablet (200 mg total) by mouth daily.   midodrine 10 MG tablet Commonly known as: PROAMATINE Take 1 tablet (10 mg total) by mouth every Monday, Wednesday, and Friday with hemodialysis.   predniSONE 20 MG tablet Commonly known as: DELTASONE Place 2 tablets (40 mg total) into feeding tube daily with breakfast. Start taking on: November 11, 2020   triamcinolone 0.1 % Commonly known as: KENALOG Apply 1 application topically 2 (two) times daily as needed (itching, rash).        Consultations:  Cardiology  Critical care  Nephrology  Palliative care  Procedures/Studies:  CT ABDOMEN PELVIS WO CONTRAST  Result Date: 11/06/2020 CLINICAL DATA:  Follow-up iliac abscess.  Acute pancreatitis. EXAM: CT ABDOMEN AND PELVIS WITHOUT CONTRAST TECHNIQUE: Multidetector CT imaging of the abdomen and pelvis was performed following the standard protocol without IV contrast. COMPARISON:  CT 10/20/2020 FINDINGS: Lower chest: Lung bases are clear. Hepatobiliary: No focal hepatic lesion. Gallstone within collapsed gallbladder. Pancreas: Exuberant inflammatory response about the pancreas. Inflammatory response difficult to assess  without IV contrast or oral on contrast. Potential organized fluid collection measuring 6.4 by 4.0 cm between the pancreas and the body the stomach (image 32/3. Inflammatory nodularity extends into the greater omentum (image 45/3) Spleen: Normal spleen Adrenals/urinary tract: Adrenal glands normal. No obstructive uropathy. Bladder normal. Stomach/Bowel: Stomach and duodenum are grossly normal. No bowel obstruction. Bowel is decompressed. Ascending and transverse colon normal. Multiple diverticula of the descending colon sigmoid colon. Rectum normal Vascular/Lymphatic: Abdominal aorta is normal caliber with atherosclerotic calcification. There is no retroperitoneal or periportal lymphadenopathy. No pelvic lymphadenopathy. Reproductive: Prostate unremarkable Other: Thickening of the RIGHT iliacus muscle measuring 6.9 x 3.2 cm (image 68/6). This compares to 7.2 x 3.1 cm on CT 04/29/2021. Visually lesion appears very similar. However, the RIGHT psoas muscle measures 4.4 cm (image 53/7) increased from 3.2 cm Musculoskeletal: No evidence of osteomyelitis or discitis. IMPRESSION: 1. Severe acute pancreatitis. Probable early pseudocyst formation between the pancreas and stomach. 2. Thickened RIGHT iliacus muscle is unchanged. Increased thickness of the RIGHT psoas muscle. Electronically Signed   By: Suzy Bouchard M.D.   On: 11/06/2020 15:08   CT ABDOMEN PELVIS WO CONTRAST  Result Date: 10/20/2020 CLINICAL DATA:  Pancreatitis, respiratory failure. EXAM: CT CHEST, ABDOMEN AND PELVIS WITHOUT CONTRAST TECHNIQUE: Multidetector CT imaging of the chest, abdomen and pelvis was performed following the standard protocol without IV contrast. COMPARISON:  October 16, 2020. FINDINGS: CT CHEST FINDINGS Cardiovascular: No significant vascular findings. Normal heart size. No pericardial effusion. Mediastinum/Nodes: No enlarged mediastinal, hilar, or axillary lymph nodes. Thyroid gland, trachea, and esophagus demonstrate no significant  findings. Lungs/Pleura: Mild to moderate bilateral pleural effusions are noted with adjacent subsegmental atelectasis of the lower lobes. No pneumothorax is noted. Patchy airspace opacities are noted in both upper lobes concerning for multifocal pneumonia. Musculoskeletal: No chest wall mass or suspicious bone lesions identified. CT ABDOMEN PELVIS FINDINGS Hepatobiliary: Large gallstone is noted. No biliary dilatation is noted. The liver is unremarkable. Pancreas: There remains is severe amount of inflammatory changes around the pancreas consistent with severe pancreatitis. No definite defined fluid collection to suggest pseudocyst is seen at this time. Spleen: Normal in size without focal abnormality. Adrenals/Urinary Tract: Adrenal glands appear normal. Left renal cyst is again noted and unchanged. No hydronephrosis or renal obstruction is noted. No renal or ureteral calculi are noted. Urinary bladder is unremarkable. Stomach/Bowel: The stomach appears normal. Feeding tube tip appears to be in proximal duodenum. There is no evidence of bowel obstruction or inflammation. Sigmoid diverticulosis is noted without inflammation. Vascular/Lymphatic: Aortic atherosclerosis. No enlarged abdominal or pelvic lymph nodes. Reproductive: Prostate is unremarkable. Other: 4.6 x 2.3 cm low density is noted within thickened right iliacus muscle concerning for developing abscess. Musculoskeletal: No acute or significant osseous findings. IMPRESSION: 1. Mild to moderate bilateral pleural effusions are noted with adjacent subsegmental atelectasis of the lower lobes. 2. Patchy airspace opacities are noted in both upper lobes concerning for multifocal pneumonia. 3. Large gallstone is noted. 4. There remains severe amount of inflammatory changes around the pancreas consistent with severe pancreatitis. No definite defined fluid collection to suggest pseudocyst is seen at this time. 5. Sigmoid diverticulosis without inflammation. 6. 4.6 x  2.3 cm low density is noted within thickened right iliacus muscle concerning for developing abscess. Aortic Atherosclerosis (ICD10-I70.0). Electronically Signed   By: Marijo Conception M.D.   On: 10/20/2020 13:55   CT ABDOMEN PELVIS WO CONTRAST  Result Date: 10/16/2020 CLINICAL DATA:  Acute generalized abdominal pain. EXAM: CT ABDOMEN AND PELVIS WITHOUT CONTRAST TECHNIQUE: Multidetector CT imaging of the abdomen and pelvis was performed following the standard protocol without IV contrast. COMPARISON:  None. FINDINGS: Lower chest: Mild bilateral pleural effusions are noted with adjacent subsegmental atelectasis. Hepatobiliary: Solitary gallstone is noted. No biliary dilatation is noted. Mild amount of fluid is noted around the liver. No focal abnormality is noted in the liver. Pancreas: Large amount of inflammatory changes are noted involving the pancreas consistent with severe acute pancreatitis. Fluid is seen extending into both pericolic gutters. No definite pseudocyst formation is seen at this time. Spleen: Mild amount of fluid is noted around the spleen. Adrenals/Urinary Tract: Adrenal glands appear normal. Left renal cyst is noted. No hydronephrosis or renal obstruction is noted. No renal or ureteral calculi are noted. Urinary bladder is decompressed secondary to Foley catheter. Stomach/Bowel: Stomach appears normal. There is no evidence of bowel obstruction. Sigmoid diverticulosis is noted without evidence of diverticulitis. Vascular/Lymphatic: Aortic atherosclerosis. No enlarged abdominal or pelvic lymph nodes. Reproductive: Prostate is unremarkable. Other: No abdominal wall hernia or abnormality. No abdominopelvic ascites. Musculoskeletal: No acute or significant osseous findings. IMPRESSION: 1. Large amount of inflammatory changes are noted involving the pancreas consistent with severe acute pancreatitis. Fluid is seen extending into both pericolic gutters as well as around the liver and spleen. No  definite pseudocyst formation is seen at this time. 2. Solitary gallstone  is noted. 3. Sigmoid diverticulosis without evidence of diverticulitis. 4. Mild bilateral pleural effusions are noted with adjacent subsegmental atelectasis. 5. Aortic atherosclerosis. Aortic Atherosclerosis (ICD10-I70.0). Electronically Signed   By: Marijo Conception M.D.   On: 10/16/2020 13:28   DG Abd 1 View  Result Date: 10/25/2020 CLINICAL DATA:  Pneumonia due to COVID-19 EXAM: ABDOMEN - 1 VIEW COMPARISON:  Abdominal radiograph 10/22/2020, chest radiograph 10/25/2020 FINDINGS: Transesophageal feeding tube tip terminates near the gastric antrum/duodenal bulb. Positioning is quite similar to comparison exam. Telemetry leads overlie the lower chest. Opacities in the lung bases, better detailed on chest radiography. Slight paucity of abdominal bowel gas is nonspecific but without features of high-grade bowel obstruction. No suspicious calcifications in the included portions of the abdomen and pelvis. Few phleboliths stent noted in the low pelvis. Degenerative changes in the spine, hips and pelvis with mild dextrocurvature of the lumbar levels. IMPRESSION: 1. Transesophageal feeding tube tip terminates near the gastric antrum/duodenal bulb. Positioning is similar to comparison exam. Electronically Signed   By: Lovena Le M.D.   On: 10/25/2020 05:51   DG Abd 1 View  Result Date: 10/22/2020 CLINICAL DATA:  Abdominal distension. COVID-19 viral infection. Sepsis. EXAM: ABDOMEN - 1 VIEW COMPARISON:  10/18/2020 FINDINGS: Feeding tube remains in place with the distal tip overlying the expected region of the duodenal bulb. Mild dilatation of several small bowel loops is noted in the left lower quadrant. Mild gaseous distention of colon also noted. These findings are suspicious for mild ileus. IMPRESSION: Feeding tube tip overlies the expected region of the duodenal bulb. Findings suspicious for mild ileus. Electronically Signed   By: Marlaine Hind M.D.   On: 10/22/2020 08:53   DG Abd 1 View  Result Date: 10/15/2020 CLINICAL DATA:  Constipation. EXAM: ABDOMEN - 1 VIEW COMPARISON:  10/14/2020 FINDINGS: Mild gaseous distention of the colon is likely consistent with a component of ileus. No significant retained stool. No small bowel dilatation. IMPRESSION: Probable mild colonic ileus. Electronically Signed   By: Aletta Edouard M.D.   On: 10/15/2020 09:37   DG Abd 1 View  Result Date: 10/14/2020 CLINICAL DATA:  Diarrhea EXAM: ABDOMEN - 1 VIEW COMPARISON:  None. FINDINGS: The bowel gas pattern is normal. No radio-opaque calculi or other significant radiographic abnormality are seen. IMPRESSION: Negative. Electronically Signed   By: Rolm Baptise M.D.   On: 10/14/2020 13:02   CT CHEST WO CONTRAST  Result Date: 10/20/2020 CLINICAL DATA:  Pancreatitis, respiratory failure. EXAM: CT CHEST, ABDOMEN AND PELVIS WITHOUT CONTRAST TECHNIQUE: Multidetector CT imaging of the chest, abdomen and pelvis was performed following the standard protocol without IV contrast. COMPARISON:  October 16, 2020. FINDINGS: CT CHEST FINDINGS Cardiovascular: No significant vascular findings. Normal heart size. No pericardial effusion. Mediastinum/Nodes: No enlarged mediastinal, hilar, or axillary lymph nodes. Thyroid gland, trachea, and esophagus demonstrate no significant findings. Lungs/Pleura: Mild to moderate bilateral pleural effusions are noted with adjacent subsegmental atelectasis of the lower lobes. No pneumothorax is noted. Patchy airspace opacities are noted in both upper lobes concerning for multifocal pneumonia. Musculoskeletal: No chest wall mass or suspicious bone lesions identified. CT ABDOMEN PELVIS FINDINGS Hepatobiliary: Large gallstone is noted. No biliary dilatation is noted. The liver is unremarkable. Pancreas: There remains is severe amount of inflammatory changes around the pancreas consistent with severe pancreatitis. No definite defined fluid collection to  suggest pseudocyst is seen at this time. Spleen: Normal in size without focal abnormality. Adrenals/Urinary Tract: Adrenal glands appear normal. Left renal cyst  is again noted and unchanged. No hydronephrosis or renal obstruction is noted. No renal or ureteral calculi are noted. Urinary bladder is unremarkable. Stomach/Bowel: The stomach appears normal. Feeding tube tip appears to be in proximal duodenum. There is no evidence of bowel obstruction or inflammation. Sigmoid diverticulosis is noted without inflammation. Vascular/Lymphatic: Aortic atherosclerosis. No enlarged abdominal or pelvic lymph nodes. Reproductive: Prostate is unremarkable. Other: 4.6 x 2.3 cm low density is noted within thickened right iliacus muscle concerning for developing abscess. Musculoskeletal: No acute or significant osseous findings. IMPRESSION: 1. Mild to moderate bilateral pleural effusions are noted with adjacent subsegmental atelectasis of the lower lobes. 2. Patchy airspace opacities are noted in both upper lobes concerning for multifocal pneumonia. 3. Large gallstone is noted. 4. There remains severe amount of inflammatory changes around the pancreas consistent with severe pancreatitis. No definite defined fluid collection to suggest pseudocyst is seen at this time. 5. Sigmoid diverticulosis without inflammation. 6. 4.6 x 2.3 cm low density is noted within thickened right iliacus muscle concerning for developing abscess. Aortic Atherosclerosis (ICD10-I70.0). Electronically Signed   By: Marijo Conception M.D.   On: 10/20/2020 13:55   MR ANGIO HEAD WO CONTRAST  Result Date: 10/14/2020 CLINICAL DATA:  TIA. EXAM: MRI HEAD WITHOUT CONTRAST MRA HEAD WITHOUT CONTRAST TECHNIQUE: Multiplanar, multiecho pulse sequences of the brain and surrounding structures were obtained without intravenous contrast. Angiographic images of the head were obtained using MRA technique without contrast. COMPARISON:  CT head 10/14/2020 FINDINGS: MRI HEAD  FINDINGS Brain: Negative for acute infarct. Small area of T2 shine through in the left frontal white matter. No areas of restricted diffusion. Mild white matter changes with scattered small periventricular deep white matter hyperintensities bilaterally. Brainstem normal. Negative for hemorrhage or mass. Negative for hydrocephalus. Cerebral volume normal. Vascular: Normal arterial flow voids Skull and upper cervical spine: No focal skeletal abnormality. Sinuses/Orbits: Mild mucosal edema paranasal sinuses and right mastoid sinus. Normal orbit Other: None MRA HEAD FINDINGS Both vertebral arteries are widely patent and normal. Basilar widely patent. AICA, PICA widely patent. Superior cerebellar and posterior cerebral arteries normal bilaterally. Internal carotid artery widely patent bilaterally. Anterior and middle cerebral arteries normal bilaterally. Negative for cerebral aneurysm. IMPRESSION: 1. Negative for acute infarct. Mild chronic microvascular ischemic change in the white matter. 2. Negative MRA head Electronically Signed   By: Franchot Gallo M.D.   On: 10/14/2020 16:24   MR BRAIN WO CONTRAST  Result Date: 10/14/2020 CLINICAL DATA:  TIA. EXAM: MRI HEAD WITHOUT CONTRAST MRA HEAD WITHOUT CONTRAST TECHNIQUE: Multiplanar, multiecho pulse sequences of the brain and surrounding structures were obtained without intravenous contrast. Angiographic images of the head were obtained using MRA technique without contrast. COMPARISON:  CT head 10/14/2020 FINDINGS: MRI HEAD FINDINGS Brain: Negative for acute infarct. Small area of T2 shine through in the left frontal white matter. No areas of restricted diffusion. Mild white matter changes with scattered small periventricular deep white matter hyperintensities bilaterally. Brainstem normal. Negative for hemorrhage or mass. Negative for hydrocephalus. Cerebral volume normal. Vascular: Normal arterial flow voids Skull and upper cervical spine: No focal skeletal abnormality.  Sinuses/Orbits: Mild mucosal edema paranasal sinuses and right mastoid sinus. Normal orbit Other: None MRA HEAD FINDINGS Both vertebral arteries are widely patent and normal. Basilar widely patent. AICA, PICA widely patent. Superior cerebellar and posterior cerebral arteries normal bilaterally. Internal carotid artery widely patent bilaterally. Anterior and middle cerebral arteries normal bilaterally. Negative for cerebral aneurysm. IMPRESSION: 1. Negative for acute infarct. Mild chronic microvascular  ischemic change in the white matter. 2. Negative MRA head Electronically Signed   By: Franchot Gallo M.D.   On: 10/14/2020 16:24   US RENAL  Result Date: 10/15/2020 CLINICAL DATA:  Acute kidney injury EXAM: RENAL / URINARY TRACT ULTRASOUND COMPLETE COMPARISON:  Radiograph 10/15/2020 FINDINGS: Right Kidney: Renal measurements: 12.2 x 5.6 x 5 cm = volume: 180.6 mL. Echogenicity within normal limits. No mass or hydronephrosis visualized. Left Kidney: Renal measurements: 11.9 x 6.2 x 4.2 cm = volume: 162.9 mL. Echogenicity within normal limits. No hydronephrosis. Exophytic cyst off the upper pole measuring 3 cm. Probable small parapelvic cyst within the mid to upper pole measuring 2 cm. Bladder: Decompressed which limits evaluation Other: Small free fluid in the right upper quadrant. Small pleural effusion. IMPRESSION: 1. No hydronephrosis. 2. Cysts within the left kidney 3. Small amount of free fluid in the abdomen. Right pleural effusion. Electronically Signed   By: Donavan Foil M.D.   On: 10/15/2020 18:47   IR Fluoro Guide CV Line Right  Result Date: 11/06/2020 INDICATION: 87 year old male referred for tunneled hemodialysis catheter EXAM: TUNNELED CENTRAL VENOUS HEMODIALYSIS CATHETER PLACEMENT WITH ULTRASOUND AND FLUOROSCOPIC GUIDANCE MEDICATIONS: None ANESTHESIA/SEDATION: Moderate (conscious) sedation was employed during this procedure. A total of Versed 1.0 mg and Fentanyl 25 mcg was administered  intravenously. Moderate Sedation Time: 12 minutes. The patient's level of consciousness and vital signs were monitored continuously by radiology nursing throughout the procedure under my direct supervision. FLUOROSCOPY TIME:  Fluoroscopy Time: 0 minutes 12 seconds (1 mGy). COMPLICATIONS: None PROCEDURE: Informed written consent was obtained from the patient after a discussion of the risks, benefits, and alternatives to treatment. Questions regarding the procedure were encouraged and answered. The right neck and chest were prepped with chlorhexidine in a sterile fashion, and a sterile drape was applied covering the operative field. Maximum barrier sterile technique with sterile gowns and gloves were used for the procedure. A timeout was performed prior to the initiation of the procedure. Ultrasound survey was performed. Micropuncture kit was utilized to access the right internal jugular vein under direct, real-time ultrasound guidance after the overlying soft tissues were anesthetized with 1% lidocaine with epinephrine. Stab incision was made with 11 blade scalpel. Microwire was passed centrally. The microwire was then marked to measure appropriate internal catheter length. External tunneled length was estimated. A total tip to cuff length of 19 cm was selected. 035 guidewire was advanced to the level of the IVC. Skin and subcutaneous tissues of chest wall below the clavicle were generously infiltrated with 1% lidocaine for local anesthesia. A small stab incision was made with 11 blade scalpel. The selected hemodialysis catheter was tunneled in a retrograde fashion from the anterior chest wall to the venotomy incision. Serial dilation was performed and then a peel-away sheath was placed. The catheter was then placed through the peel-away sheath with tips ultimately positioned within the superior aspect of the right atrium. Final catheter positioning was confirmed and documented with a spot radiographic image. The  catheter aspirates and flushes normally. The catheter was flushed with appropriate volume heparin dwells. The catheter exit site was secured with a 0-Prolene retention suture. Gel-Foam slurry was infused into the soft tissue tract. The venotomy incision was closed Derma bond and sterile dressing. Dressings were applied at the chest wall. Patient tolerated the procedure well and remained hemodynamically stable throughout. No complications were encountered and no significant blood loss encountered. IMPRESSION: Status post right IJ tunneled hemodialysis catheter. Signed, Dulcy Fanny. Earleen Newport, DO, RPVI  [facility] Electronically Signed   By: Corrie Mckusick D.O.   On: 11/06/2020 16:58   IR US Guide Vasc Access Right  Result Date: 11/06/2020 INDICATION: 87 year old male referred for tunneled hemodialysis catheter EXAM: TUNNELED CENTRAL VENOUS HEMODIALYSIS CATHETER PLACEMENT WITH ULTRASOUND AND FLUOROSCOPIC GUIDANCE MEDICATIONS: None ANESTHESIA/SEDATION: Moderate (conscious) sedation was employed during this procedure. A total of Versed 1.0 mg and Fentanyl 25 mcg was administered intravenously. Moderate Sedation Time: 12 minutes. The patient's level of consciousness and vital signs were monitored continuously by radiology nursing throughout the procedure under my direct supervision. FLUOROSCOPY TIME:  Fluoroscopy Time: 0 minutes 12 seconds (1 mGy). COMPLICATIONS: None PROCEDURE: Informed written consent was obtained from the patient after a discussion of the risks, benefits, and alternatives to treatment. Questions regarding the procedure were encouraged and answered. The right neck and chest were prepped with chlorhexidine in a sterile fashion, and a sterile drape was applied covering the operative field. Maximum barrier sterile technique with sterile gowns and gloves were used for the procedure. A timeout was performed prior to the initiation of the procedure.  Ultrasound survey was performed. Micropuncture kit was utilized to access the right internal jugular vein under direct, real-time ultrasound guidance after the overlying soft tissues were anesthetized with 1% lidocaine with epinephrine. Stab incision was made with 11 blade scalpel. Microwire was passed centrally. The microwire was then marked to measure appropriate internal catheter length. External tunneled length was estimated. A total tip to cuff length of 19 cm was selected. 035 guidewire was advanced to the level of the IVC. Skin and subcutaneous tissues of chest wall below the clavicle were generously infiltrated with 1% lidocaine for local anesthesia. A small stab incision was made with 11 blade scalpel. The selected hemodialysis catheter was tunneled in a retrograde fashion from the anterior chest wall to the venotomy incision. Serial dilation was performed and then a peel-away sheath was placed. The catheter was then placed through the peel-away sheath with tips ultimately positioned within the superior aspect of the right atrium. Final catheter positioning was confirmed and documented with a spot radiographic image. The catheter aspirates and flushes normally. The catheter was flushed with appropriate volume heparin dwells. The catheter exit site was secured with a 0-Prolene retention suture. Gel-Foam slurry was infused into the soft tissue tract. The venotomy incision was closed Derma bond and sterile dressing. Dressings were applied at the chest wall. Patient tolerated the procedure well and remained hemodynamically stable throughout. No complications were encountered and no significant blood loss encountered. IMPRESSION: Status post right IJ tunneled hemodialysis catheter. Signed, Dulcy Fanny. Dellia Nims, RPVI [facility] Radiology Electronically Signed   By: Corrie Mckusick D.O.   On: 11/06/2020 16:58   DG CHEST PORT 1 VIEW  Result Date:  10/25/2020 CLINICAL DATA:  COVID.  Pneumonia. EXAM: PORTABLE CHEST 1 VIEW COMPARISON:  10/22/2020.  CT 10/20/2020. FINDINGS: Feeding tube and dual-lumen right IJ catheter stable position. Mediastinum stable. Stable cardiomegaly. Bilateral interstitial infiltrates again noted without significant interim change. No pleural effusion or pneumothorax. Stable biapical pleural thickening most consistent scarring. Degenerative change thoracic spine. IMPRESSION: 1. Feeding tube and dual-lumen right IJ catheter in stable position. 2. Stable cardiomegaly. 3. Bilateral interstitial infiltrates again noted without significant interim change. Electronically Signed   By: Marcello Moores  Register   On: 10/25/2020 05:50   DG CHEST PORT 1 VIEW  Result Date: 10/22/2020 CLINICAL DATA:  Abdominal distension EXAM: PORTABLE CHEST 1 VIEW  COMPARISON:  Five days ago FINDINGS: Feeding tube that at least reaches the stomach. Right IJ line in stable position. History of COVID with stable bilateral opacity. Stable heart size and mediastinal contours with upper vascular pedicle widening from vessels based on recent CT. No visible effusion or pneumothorax. IMPRESSION: Stable multifocal pneumonia. Electronically Signed   By: Monte Fantasia M.D.   On: 10/22/2020 08:53   DG CHEST PORT 1 VIEW  Result Date: 10/17/2020 CLINICAL DATA:  Central line placement EXAM: PORTABLE CHEST 1 VIEW COMPARISON:  Earlier today FINDINGS: Right IJ line with tip at the SVC. Prominent upper mediastinal width with biapical density that is stable; no suspected line related hematoma. There is hazy bilateral chest opacity in the setting of COVID infection. Stable heart size. Negative for pneumothorax. IMPRESSION: New central line with tip at the SVC.  Negative for pneumothorax. Electronically Signed   By: Monte Fantasia M.D.   On: 10/17/2020 10:42   DG Chest Port 1 View  Result Date: 10/17/2020 CLINICAL DATA:  Respiratory failure.  COVID. EXAM: PORTABLE CHEST 1 VIEW  COMPARISON:  10/16/2020. FINDINGS: Heart size stable. Low lung volumes with persistent bibasilar infiltrates/edema. Small left pleural effusion most likely present. No pneumothorax. Degenerative change thoracic spine. IMPRESSION: Low lung volumes with persistent bibasilar infiltrates/edema. Small left pleural effusion most likely present. Similar findings noted on prior exam. Electronically Signed   By: Marcello Moores  Register   On: 10/17/2020 07:23   DG Chest Port 1 View  Result Date: 10/16/2020 CLINICAL DATA:  Shortness of breath, COVID EXAM: PORTABLE CHEST 1 VIEW COMPARISON:  10/15/2020 FINDINGS: No significant change in AP portable chest radiograph, likely with small bilateral layering pleural effusions. No new or focal airspace opacity. Heart and mediastinum are IMPRESSION: No significant change in AP portable chest radiograph, likely with small bilateral layering pleural effusions. No new or focal airspace opacity. Electronically Signed   By: Eddie Candle M.D.   On: 10/16/2020 09:00   DG Chest Port 1 View  Result Date: 10/15/2020 CLINICAL DATA:  COVID positive, dyspnea EXAM: PORTABLE CHEST 1 VIEW COMPARISON:  Chest radiograph from one day prior. FINDINGS: Stable cardiomediastinal silhouette with top-normal heart size. No pneumothorax. Small left pleural effusion, stable. No right pleural effusion. No pulmonary edema. Left retrocardiac opacity with increased volume loss. IMPRESSION: 1. Stable small left pleural effusion. 2. Left retrocardiac opacity with increased volume loss, indicating left lower lobe atelectasis with possible component of pneumonia. Electronically Signed   By: Ilona Sorrel M.D.   On: 10/15/2020 07:53   DG Chest Port 1 View  Result Date: 10/14/2020 CLINICAL DATA:  COVID positive EXAM: PORTABLE CHEST 1 VIEW COMPARISON:  06/30/2017 chest radiograph. FINDINGS: Stable cardiomediastinal silhouette with normal heart size. No pneumothorax. No pleural effusion. No overt pulmonary edema. Patchy left  lung base opacity. IMPRESSION: Patchy left lung base opacity, suggesting aspiration or pneumonia. Electronically Signed   By: Ilona Sorrel M.D.   On: 10/14/2020 07:39   DG Abd Portable 1V  Result Date: 10/18/2020 CLINICAL DATA:  Feeding tube placement. EXAM: PORTABLE ABDOMEN - 1 VIEW COMPARISON:  October 15, 2020. FINDINGS: The bowel gas pattern is normal. Distal tip of feeding tube is seen in expected position of distal stomach. No radio-opaque calculi or other significant radiographic abnormality are seen. IMPRESSION: Distal tip of feeding tube seen in expected position of distal stomach. Electronically Signed   By: Marijo Conception M.D.   On: 10/18/2020 10:46   CT HEAD CODE STROKE WO CONTRAST  Result Date: 10/14/2020 CLINICAL DATA:  Code stroke.  Suspected TIA EXAM: CT HEAD WITHOUT CONTRAST TECHNIQUE: Contiguous axial images were obtained from the base of the skull through the vertex without intravenous contrast. COMPARISON:  None. FINDINGS: Brain: There is no mass, hemorrhage or extra-axial collection. The size and configuration of the ventricles and extra-axial CSF spaces are normal. The brain parenchyma is normal, without evidence of acute or chronic infarction. Vascular: No abnormal hyperdensity of the major intracranial arteries or dural venous sinuses. No intracranial atherosclerosis. Skull: The visualized skull base, calvarium and extracranial soft tissues are normal. Sinuses/Orbits: No fluid levels or advanced mucosal thickening of the visualized paranasal sinuses. No mastoid or middle ear effusion. The orbits are normal. ASPECTS [facility] Stroke Program Early CT Score) - Ganglionic level infarction (caudate, lentiform nuclei, internal capsule, insula, M1-M3 cortex): 7 - Supraganglionic infarction (M4-M6 cortex): 3 Total score (0-10 with 10 being normal): 10 IMPRESSION: 1. Normal head CT. 2. ASPECTS is 10. 3. These results were communicated to Dr. Donnetta Simpers at 1:45 am on 10/14/2020 by telephone.  Electronically Signed   By: Ulyses Jarred M.D.   On: 10/14/2020 01:46   VAS US CAROTID  Result Date: 10/15/2020 Carotid Arterial Duplex Study Indications:       Speech disturbance and Confusion, facial droop. Other Factors:     Covid-19, atrial fibrillation, CHF,. Limitations        Today's exam was limited due to Rapid atrial fibrillation and                    rapid, heavy breathing. Comparison Study:  No prior study Performing Technologist: Sharion Dove RVS  Examination Guidelines: A complete evaluation includes B-mode imaging, spectral Doppler, color Doppler, and power Doppler as needed of all accessible portions of each vessel. Bilateral testing is considered an integral part of a complete examination. Limited examinations for reoccurring indications may be performed as noted.  Right Carotid Findings: +----------+--------+--------+--------+------------------+------------------+           PSV cm/sEDV cm/sStenosisPlaque DescriptionComments           +----------+--------+--------+--------+------------------+------------------+ CCA Prox  104     38                                intimal thickening +----------+--------+--------+--------+------------------+------------------+ CCA Distal88      22                                intimal thickening +----------+--------+--------+--------+------------------+------------------+ ICA Prox  72      26                                                   +----------+--------+--------+--------+------------------+------------------+ ICA Distal93      35                                                   +----------+--------+--------+--------+------------------+------------------+ ECA       67      14                                                   +----------+--------+--------+--------+------------------+------------------+ +----------+--------+-------+--------+-------------------+  PSV cm/sEDV cmsDescribeArm Pressure  (mmHG) +----------+--------+-------+--------+-------------------+ Subclavian171                                        +----------+--------+-------+--------+-------------------+ +---------+--------+--+--------+--+ VertebralPSV cm/s77EDV cm/s19 +---------+--------+--+--------+--+  Left Carotid Findings: +----------+--------+--------+--------+------------------+------------------+           PSV cm/sEDV cm/sStenosisPlaque DescriptionComments           +----------+--------+--------+--------+------------------+------------------+ CCA Prox  132     48                                intimal thickening +----------+--------+--------+--------+------------------+------------------+ CCA Distal175     43                                intimal thickening +----------+--------+--------+--------+------------------+------------------+ ICA Prox  119     33                                                   +----------+--------+--------+--------+------------------+------------------+ ICA Distal109     42                                                   +----------+--------+--------+--------+------------------+------------------+ ECA       108     10                                                   +----------+--------+--------+--------+------------------+------------------+ +----------+--------+--------+--------+-------------------+           PSV cm/sEDV cm/sDescribeArm Pressure (mmHG) +----------+--------+--------+--------+-------------------+ Subclavian212                                         +----------+--------+--------+--------+-------------------+ +---------+--------+---+--------+--+ VertebralPSV cm/s101EDV cm/s30 +---------+--------+---+--------+--+   Summary: Right Carotid: The extracranial vessels were near-normal with only minimal wall                thickening or plaque. Left Carotid: The extracranial vessels were near-normal with only minimal wall                thickening or plaque. Vertebrals:  Bilateral vertebral arteries demonstrate antegrade flow. Subclavians: Normal flow hemodynamics were seen in bilateral subclavian              arteries. *See table(s) above for measurements and observations.  Electronically signed by Antony Contras MD on 10/15/2020 at 2:20:13 PM.    Final    ECHOCARDIOGRAM LIMITED  Result Date: 10/16/2020    ECHOCARDIOGRAM LIMITED REPORT   Patient Name:   Butch Otterson. Date of Exam: 10/16/2020 Medical Rec #:  [id number]             Height:       68.0 in Accession #:    [id number]  Weight:       209.7 lb Date of Birth:  11-04-1930             BSA:          2.085 m Patient Age:    87 years              BP:           111/85 mmHg Patient Gender: M                     HR:           97 bpm. Exam Location:  Inpatient Procedure: Limited Echo, Cardiac Doppler and Color Doppler Indications:    TIA  History:        Patient has prior history of Echocardiogram examinations, most                 recent 09/07/2019. CHF, TIA, Arrythmias:Atrial Fibrillation;                 Risk Factors:Hypertension. COVID+.  Sonographer:    Dustin Flock Referring Phys: [place]  1. Left ventricular ejection fraction, by estimation, is 55 to 60%. The left ventricle has normal function. The left ventricle has no regional wall motion abnormalities. Left ventricular diastolic function could not be evaluated.  2. Right ventricular systolic function is normal. The right ventricular size is normal. There is normal pulmonary artery systolic pressure. The estimated right ventricular systolic pressure is 40.9 mmHg.  3. Left atrial size was mildly dilated.  4. Right atrial size was mildly dilated.  5. The mitral valve is normal in structure. No evidence of mitral valve regurgitation. No evidence of mitral stenosis.  6. The aortic valve is normal in structure. Aortic valve regurgitation is not visualized. No aortic stenosis is present.   7. The inferior vena cava is normal in size with greater than 50% respiratory variability, suggesting right atrial pressure of 3 mmHg. FINDINGS  Left Ventricle: Left ventricular ejection fraction, by estimation, is 55 to 60%. The left ventricle has normal function. The left ventricle has no regional wall motion abnormalities. The left ventricular internal cavity size was normal in size. There is  no left ventricular hypertrophy. Left ventricular diastolic function could not be evaluated. Left ventricular diastolic function could not be evaluated due to atrial fibrillation. Right Ventricle: The right ventricular size is normal. No increase in right ventricular wall thickness. Right ventricular systolic function is normal. There is normal pulmonary artery systolic pressure. The tricuspid regurgitant velocity is 2.76 m/s, and  with an assumed right atrial pressure of 3 mmHg, the estimated right ventricular systolic pressure is 73.5 mmHg. Left Atrium: Left atrial size was mildly dilated. Right Atrium: Right atrial size was mildly dilated. Pericardium: There is no evidence of pericardial effusion. Mitral Valve: The mitral valve is normal in structure. No evidence of mitral valve stenosis. Tricuspid Valve: The tricuspid valve is normal in structure. Tricuspid valve regurgitation is not demonstrated. No evidence of tricuspid stenosis. Aortic Valve: The aortic valve is normal in structure. Aortic valve regurgitation is not visualized. No aortic stenosis is present. Pulmonic Valve: The pulmonic valve was normal in structure. Pulmonic valve regurgitation is not visualized. No evidence of pulmonic stenosis. Aorta: The aortic root is normal in size and structure. Venous: The inferior vena cava is normal in size with greater than 50% respiratory variability, suggesting right atrial pressure of 3 mmHg. IAS/Shunts: No atrial level shunt detected by  color flow Doppler. LEFT VENTRICLE PLAX 2D LVIDd:         5.40 cm Diastology LVIDs:          3.60 cm LV e' medial:    9.12 cm/s LV PW:         1.10 cm LV E/e' medial:  4.1 LV IVS:        1.00 cm LV e' lateral:   8.49 cm/s                        LV E/e' lateral: 4.4  RIGHT VENTRICLE RV S prime:     6.20 cm/s LEFT ATRIUM         Index LA diam:    3.20 cm 1.53 cm/m  MITRAL VALVE               TRICUSPID VALVE MV Area (PHT): 3.42 cm    TR Peak grad:   30.5 mmHg MV Decel Time: 222 msec    TR Vmax:        276.00 cm/s MV E velocity: 37.43 cm/s MV A velocity: 68.90 cm/s MV E/A ratio:  0.54 Mihai Croitoru MD Electronically signed by Sanda Klein MD Signature Date/Time: 10/16/2020/4:41:44 PM    Final    US Abdomen Limited RUQ (LIVER/GB)  Result Date: 10/21/2020 CLINICAL DATA:  Acute pancreatitis. EXAM: ULTRASOUND ABDOMEN LIMITED RIGHT UPPER QUADRANT COMPARISON:  None. FINDINGS: Gallbladder: A 1.4 cm gallstone is seen. There is no evidence of gallbladder dilatation or wall thickening. No evidence of pericholecystic fluid. No sonographic Murphy sign noted by sonographer. Common bile duct: Diameter: 2 mm, within normal limits. Liver: No focal lesion identified. Within normal limits in parenchymal echogenicity. Portal vein is patent on color Doppler imaging with normal direction of blood flow towards the liver. Other: Right pleural effusion incidentally noted. IMPRESSION: Cholelithiasis. No sonographic signs of cholecystitis or biliary ductal dilatation. Right pleural effusion incidentally noted. Electronically Signed   By: Marlaine Hind M.D.   On: 10/21/2020 15:26      Subjective: - no chest pain, shortness of breath, no abdominal pain, nausea or vomiting.   Discharge Exam: BP (!) 111/59 (BP Location: Right Arm)   Pulse 84   Temp 98.7 F (37.1 C) (Axillary)   Resp 18   Ht _0  (1.727 m)   Wt 90.1 kg   SpO2 95%   BMI 30.20 kg/m   General: Pt is alert, awake, not in acute distress Cardiovascular: RRR, S1/S2 +, no rubs, no gallops Respiratory: CTA bilaterally, no wheezing, no  rhonchi Abdominal: Soft, NT, ND, bowel sounds + Extremities: no edema, no cyanosis   The results of significant diagnostics from this hospitalization (including imaging, microbiology, ancillary and laboratory) are listed below for reference.     Microbiology: No results found for this or any previous visit (from the past 240 hour(s)).   Labs: Basic Metabolic Panel: Recent Labs  Lab 11/04/20 0423 11/05/20 0315 11/06/20 0027 11/07/20 0200 11/08/20 0223 11/09/20 0344 11/10/20 0415  NA 136 138 137 133*  --  136  --   K 4.0 4.3 4.6 4.2  --  4.9  --   CL 97* 101 101 97*  --  100  --   CO2 22 20* 17* 16*  --  16*  --   GLUCOSE 93 60* 123* 117*  --  74  --   BUN 65* 88* 110* 135*  --  109*  --   CREATININE 4.78* 6.07*  7.26* 8.52*  --  7.50*  --   CALCIUM 7.8* 7.8* 7.8* 7.8*  --  8.3*  --   MG 2.0 2.1 2.2 2.3 2.0 2.2 2.1  PHOS 8.3* 9.8* 10.6*  --   --  9.4*  --    Liver Function Tests: Recent Labs  Lab 11/05/20 0315 11/06/20 0027 11/06/20 1007 11/07/20 0200 11/09/20 0344  AST  --   --  32 26  --   ALT  --   --  25 21  --   ALKPHOS  --   --  125 109  --   BILITOT  --   --  1.1 1.0  --   PROT  --   --  4.9* 4.5*  --   ALBUMIN 1.7* 1.8* 2.0* 1.7* 2.0*   CBC: Recent Labs  Lab 11/06/20 0027 11/06/20 1007 11/07/20 0200 11/08/20 0223 11/09/20 0344  WBC 39.3* 46.6* 41.4* 42.3* 38.6*  NEUTROABS  --   --   --   --  27.0*  HGB 8.1* 8.4* 7.8* 7.4* 7.6*  HCT 25.0* 24.4* 23.6* 22.0* 24.1*  MCV 89.3 88.4 89.4 87.6 93.1  PLT 75* 84* 86* 92* 100*   CBG: Recent Labs  Lab 11/09/20 1644 11/09/20 2111 11/09/20 2337 11/10/20 0415 11/10/20 0737  GLUCAP 154* 167* 113* 74 90   Hgb A1c No results for input(s): HGBA1C in the last 72 hours. Lipid Profile No results for input(s): CHOL, HDL, LDLCALC, TRIG, CHOLHDL, LDLDIRECT in the last 72 hours. Thyroid function studies No results for input(s): TSH, T4TOTAL, T3FREE, THYROIDAB in the last 72 hours.  Invalid input(s):  FREET3 Urinalysis    Component Value Date/Time   COLORURINE AMBER (A) 10/14/2020 0449   APPEARANCEUR CLOUDY (A) 10/14/2020 0449   LABSPEC 1.024 10/14/2020 0449   PHURINE 5.0 10/14/2020 0449   GLUCOSEU NEGATIVE 10/14/2020 0449   HGBUR NEGATIVE 10/14/2020 0449   BILIRUBINUR SMALL (A) 10/14/2020 0449   KETONESUR NEGATIVE 10/14/2020 0449   PROTEINUR 30 (A) 10/14/2020 0449   NITRITE NEGATIVE 10/14/2020 0449   LEUKOCYTESUR NEGATIVE 10/14/2020 0449    FURTHER DISCHARGE INSTRUCTIONS:   Get Medicines reviewed and adjusted: Please take all your medications with you for your next visit with your Primary MD   Laboratory/radiological data: Please request your Primary MD to go over all hospital tests and procedure/radiological results at the follow up, please ask your Primary MD to get all Hospital records sent to his/her office.   In some cases, they will be blood work, cultures and biopsy results pending at the time of your discharge. Please request that your primary care M.D. goes through all the records of your hospital data and follows up on these results.   Also Note the following: If you experience worsening of your admission symptoms, develop shortness of breath, life threatening emergency, suicidal or homicidal thoughts you must seek medical attention immediately by calling 911 or calling your MD immediately  if symptoms less severe.   You must read complete instructions/literature along with all the possible adverse reactions/side effects for all the Medicines you take and that have been prescribed to you. Take any new Medicines after you have completely understood and accpet all the possible adverse reactions/side effects.    Do not drive when taking Pain medications or sleeping medications (Benzodaizepines)   Do not take more than prescribed Pain, Sleep and Anxiety Medications. It is not advisable to combine anxiety,sleep and pain medications without talking with your primary care  practitioner  Special Instructions: If you have smoked or chewed Tobacco  in the last 2 yrs please stop smoking, stop any regular Alcohol  and or any Recreational drug use.   Wear Seat belts while driving.   Please note: You were cared for by a hospitalist during your hospital stay. Once you are discharged, your primary care physician will handle any further medical issues. Please note that NO REFILLS for any discharge medications will be authorized once you are discharged, as it is imperative that you return to your primary care physician (or establish a relationship with a primary care physician if you do not have one) for your post hospital discharge needs so that they can reassess your need for medications and monitor your lab values.  Time coordinating discharge: 35 minutes  SIGNED:  Marzetta Board, MD, PhD 11/10/2020, 10:40 AM

## 2020-11-10 NOTE — H&P (Signed)
Physical Medicine and Rehabilitation Admission H&P    Chief Complaint  Patient presents with  . Debility    HPI: Raymond Hurley is an 87 year old male with history of melanoma, vitamin B12 deficiency, A. fib--on Eliquis who was admitted on 10/14/2020 with acute right facial droop, dysarthria, and difficulty following commands.  Family reported patient with recent diagnosis of Covid with diarrhea and poor p.o. intake.  MRI/MRA head was unremarkable for acute intracranial process.  He was found to have leukocytosis with elevated BNP as well as acute kidney injury and elevated D-dimer.  He was treated with fluid protocol and IV antibiotics for septic shock. CT abdomen 01/07 showed multifocal pneumonia as well as severe inflammatory changes around pancreas consistent with severe pancreatitis as well as 4.6 x 2.3 cm low-density in right iliac is muscle concerning for developing abscess.  Dr. Alessandra Bevels consulted for input and pancreatitis treated with supportive care as well as course of meropenem and linezolid.  Acute renal failure felt to be due to to ATN with hypovolemia and hypotension.  He required CVVHD and has been transitioned to hemodialysis.  Acute hypoxic respiratory failure due to aspiration pneumonia treated with stress dose steroids with improvement with recommendations for slow prednisone taper.  Amiodarone was added for rate control of atrial fibrillation with RVR per cardiology input.  He has had issues with GIB due to hematochezia and required multiple transfusions and anticoagulation was discontinued.  Thrombocytopenia being monitored.  He completed antibiotic course on 10/31/2020 and follow-up CT abdomen 11/06/2020 showed severe acute pancreatitis with probable early pseudocyst formation as well as increased thickness in the right psoas muscle and no change in right iliac [place] muscle.  Current recommendations are to monitor off antibiotics. Encephalopathy improving, he is tolerating  regular diet and currently noted to be significantly debilitated with generalized weakness affecting ability to stand or complete ADL tasks. CIR recommended due to functional decline.  Please see preadmission assessment from earlier today as well.  Review of Systems  Constitutional: Positive for malaise/fatigue. Negative for chills and fever.  HENT: Negative for hearing loss and tinnitus.   Eyes: Negative for blurred vision and double vision.  Respiratory: Negative for cough and shortness of breath.   Cardiovascular: Positive for leg swelling. Negative for chest pain and palpitations.  Gastrointestinal: Negative for constipation, diarrhea, heartburn and nausea.  Genitourinary: Negative for dysuria and urgency.  Musculoskeletal: Negative for myalgias.  Skin: Negative for itching and rash.  Neurological: Positive for weakness.  Psychiatric/Behavioral: The patient is not nervous/anxious and does not have insomnia.   All other systems reviewed and are negative.     Past Medical History:  Diagnosis Date  . Atrial fibrillation ([place])   . CAD (coronary artery disease) 04/2019  . CHF (congestive heart failure) ([place])   . Melanoma ([place])   . Vitamin B 12 deficiency    Past Surgical History:  Procedure Laterality Date  . CARDIOVERSION N/A 06/02/2019   Procedure: CARDIOVERSION;  Surgeon: Jerline Pain, MD;  Location: [facility] ENDOSCOPY;  Service: Cardiovascular;  Laterality: N/A;  . IR FLUORO GUIDE CV LINE RIGHT  11/06/2020  . IR US GUIDE VASC ACCESS RIGHT  11/06/2020  . RIGHT/LEFT HEART CATH AND CORONARY ANGIOGRAPHY N/A 04/27/2019   Procedure: RIGHT/LEFT HEART CATH AND CORONARY ANGIOGRAPHY;  Surgeon: Jolaine Artist, MD;  Location: [facility];  Service: Cardiovascular;  Laterality: N/A;  . TEE WITHOUT CARDIOVERSION N/A 06/02/2019   Procedure: TRANSESOPHAGEAL ECHOCARDIOGRAM (TEE);  Surgeon: Marlou Porch,  Thana Farr, MD;  Location: [facility] ENDOSCOPY;  Service: Cardiovascular;  Laterality: N/A;    Family  History  Problem Relation Age of Onset  . Melanoma Daughter      Social History:  reports that he has never smoked. He has never used smokeless tobacco. He reports current alcohol use of about 7.0 standard drinks of alcohol per week. He reports that he does not use drugs.   Allergies  Allergen Reactions  . Povidone-Iodine Swelling  . Tape Swelling  . Bee Venom Rash  . Covid-19 Mrna Vacc (Moderna) Hives and Rash    Rash came after booster shot. Pt was fine with 1st and 2nd    Medications Prior to Admission  Medication Sig Dispense Refill  . acetaminophen (TYLENOL) 500 MG tablet Take 1,000 mg by mouth every 6 (six) hours as needed for mild pain or headache.    Marland Kitchen ELIQUIS 5 MG TABS tablet TAKE ONE TABLET TWICE DAILY (Patient taking differently: Take 5 mg by mouth 2 (two) times daily.) 180 tablet 1  . metoprolol succinate (TOPROL-XL) 25 MG 24 hr tablet Take 1 tablet (25 mg total) by mouth every evening. 90 tablet 3  . sacubitril-valsartan (ENTRESTO) 24-26 MG Take 1 tablet by mouth 2 (two) times daily. 180 tablet 3  . triamcinolone cream (KENALOG) 0.1 % Apply 1 application topically 2 (two) times daily as needed (itching, rash).    . montelukast (SINGULAIR) 10 MG tablet Take 1 tablet by mouth once daily as directed (Patient not taking: No sig reported) 30 tablet 5    Drug Regimen Review  Drug regimen was reviewed and remains appropriate with no significant issues identified  Home: Home Living Family/patient expects to be discharged to:: Private residence Living Arrangements: Spouse/significant other Available Help at Discharge: Family,Available 24 hours/day Type of Home: House Home Access: Level entry (at front) Home Layout: One level,Other (Comment),Laundry or work area in basement [employer] Shower/Tub: Chiropodist: Standard Home Equipment: Shower seat,Grab bars - tub/shower Additional Comments: spouse is healthy; son works for family business   Functional  History: Prior Function Level of Independence: Independent with assistive device(s),Independent Comments: driving; work full-time; Careers adviser; no AD  Functional Status:  Mobility: Bed Mobility Overal bed mobility: Needs Assistance Bed Mobility: Sidelying to Sit Rolling: Mod assist Sidelying to sit: Mod assist,HOB elevated Supine to sit: Max assist,+2 for safety/equipment Sit to supine: Mod assist Sit to sidelying: +2 for physical assistance,Mod assist General bed mobility comments: Assist to bring legs off of bed, elevate trunk into sitting. Transfers Overall transfer level: Needs assistance Equipment used: Ambulation equipment used,Rolling walker (2 wheeled) Transfer via Lift Equipment: Stedy Transfers: Sit to/from Stand Sit to Stand: +2 physical assistance,Mod assist,Min assist Stand pivot transfers: Mod assist,+2 safety/equipment General transfer comment: +2 min assist to stand with Stedy from elevate surface. Use of bed pad to bring hips up. +2 mod assist to bring hips up from low recliner and using walker. Bed to chair with Stedy Ambulation/Gait Ambulation/Gait assistance: +2 physical assistance,Mod assist Gait Distance (Feet): 1 Feet Assistive device: Rolling walker (2 wheeled) Gait Pattern/deviations: Step-to pattern,Decreased step length - right,Decreased step length - left,Leaning posteriorly (knee hyperextension) General Gait Details: Assist for balance and support. Gait velocity: decr Gait velocity interpretation: <1.31 ft/sec, indicative of household ambulator    ADL: ADL Overall ADL's : Needs assistance/impaired Eating/Feeding: Set up,Sitting Grooming: Wash/dry hands,Wash/dry face,Set up,Sitting Grooming Details (indicate cue type and reason): EOB Upper Body Bathing: Moderate assistance,Sitting Lower Body Bathing: Moderate assistance,Sitting/lateral  leans Lower Body Bathing Details (indicate cue type and reason): assist for back Upper Body Dressing  : Minimal assistance,Sitting Lower Body Dressing: Total assistance,Bed level Lower Body Dressing Details (indicate cue type and reason): for socks Toilet Transfer: Moderate assistance,+2 for physical assistance,+2 for safety/equipment,BSC Toilet Transfer Details (indicate cue type and reason): initially light mod A +2 and with each sit<>stand increased assist required Toileting- Clothing Manipulation and Hygiene: Maximal assistance,+2 for physical assistance,+2 for safety/equipment,Sit to/from stand Toileting - Clothing Manipulation Details (indicate cue type and reason): pt on bed pan upon arrival, totalA for pericare after Functional mobility during ADLs: Moderate assistance,+2 for physical assistance,+2 for safety/equipment (stedy) General ADL Comments: greatly improving and very motivated to return to PLOF  Cognition: Cognition Overall Cognitive Status: Within Functional Limits for tasks assessed Orientation Level: Oriented X4 Cognition Arousal/Alertness: Awake/alert Behavior During Therapy: WFL for tasks assessed/performed Overall Cognitive Status: Within Functional Limits for tasks assessed Area of Impairment: Problem solving,Attention,Awareness Orientation Level: Time Current Attention Level: Selective Memory: Decreased short-term memory Safety/Judgement: Decreased awareness of deficits Awareness: Emergent Problem Solving: Slow processing,Requires verbal cues General Comments: multimodal cues for initiating/sequencing mobility tasks today Difficult to assess due to: Hard of hearing/deaf   Blood pressure (!) 115/55, pulse 78, temperature 98.3 F (36.8 C), temperature source Axillary, resp. rate 18, height 5\' 8"  (1.727 m), weight 90.1 kg, SpO2 95 %. Physical Exam Vitals and nursing note reviewed.  Constitutional:      General: He is not in acute distress.    Appearance: He is not ill-appearing.  HENT:     Head: Normocephalic and atraumatic.     Right Ear: External ear  normal.     Left Ear: External ear normal.     Nose: Nose normal.  Eyes:     General:        Right eye: No discharge.        Left eye: No discharge.     Extraocular Movements: Extraocular movements intact.  Cardiovascular:     Rate and Rhythm: Normal rate and regular rhythm.  Pulmonary:     Effort: Pulmonary effort is normal. No respiratory distress.     Breath sounds: No stridor.  Abdominal:     General: Bowel sounds are normal. There is distension.     Tenderness: There is no abdominal tenderness.  Musculoskeletal:        General: Swelling (2+, right lower extremity greater than left lower extremity) present.     Cervical back: Normal range of motion and neck supple.  Skin:    General: Skin is warm and dry.  Neurological:     Mental Status: He is alert and oriented to person, place, and time.     Comments: Alert Motor: Bilateral upper extremity: 4/5 proximal distal Bilateral lower extremities: Hip flexion, knee extension 2+/5, ankle dorsiflexion 4/5  Psychiatric:        Mood and Affect: Mood normal.        Behavior: Behavior normal.     Results for orders placed or performed during the hospital encounter of 10/14/20 (from the past 48 hour(s))  Glucose, capillary     Status: Abnormal   Collection Time: 11/08/20  4:10 PM  Result Value Ref Range   Glucose-Capillary 136 (H) 70 - 99 mg/dL    Comment: Glucose reference range applies only to samples taken after fasting for at least 8 hours.  Glucose, capillary     Status: Abnormal   Collection Time: 11/08/20  9:17 PM  Result Value  Ref Range   Glucose-Capillary 161 (H) 70 - 99 mg/dL    Comment: Glucose reference range applies only to samples taken after fasting for at least 8 hours.  Magnesium     Status: None   Collection Time: 11/09/20  3:44 AM  Result Value Ref Range   Magnesium 2.2 1.7 - 2.4 mg/dL    Comment: Performed at [facility] [address] [place], [place] [address]  Renal function panel     Status:  Abnormal   Collection Time: 11/09/20  3:44 AM  Result Value Ref Range   Sodium 136 135 - 145 mmol/L   Potassium 4.9 3.5 - 5.1 mmol/L   Chloride 100 98 - 111 mmol/L   CO2 16 (L) 22 - 32 mmol/L   Glucose, Bld 74 70 - 99 mg/dL    Comment: Glucose reference range applies only to samples taken after fasting for at least 8 hours.   BUN 109 (H) 8 - 23 mg/dL   Creatinine, Ser 7.50 (H) 0.61 - 1.24 mg/dL   Calcium 8.3 (L) 8.9 - 10.3 mg/dL   Phosphorus 9.4 (H) 2.5 - 4.6 mg/dL   Albumin 2.0 (L) 3.5 - 5.0 g/dL   GFR, Estimated 6 (L) >60 mL/min    Comment: (NOTE) Calculated using the CKD-EPI Creatinine Equation (2021)    Anion gap 20 (H) 5 - 15    Comment: Performed at [facility] [address] [place], [place] [address]  Parathyroid hormone, intact (no Ca)     Status: Abnormal   Collection Time: 11/09/20  3:44 AM  Result Value Ref Range   PTH 217 (H) 15 - 65 pg/mL    Comment: (NOTE) Performed At: [facility] [place], [place] [id number] Rush Farmer MD [id number]   CBC with Differential/Platelet     Status: Abnormal   Collection Time: 11/09/20  3:44 AM  Result Value Ref Range   WBC 38.6 (H) 4.0 - 10.5 K/uL   RBC 2.59 (L) 4.22 - 5.81 MIL/uL   Hemoglobin 7.6 (L) 13.0 - 17.0 g/dL   HCT 24.1 (L) 39.0 - 52.0 %   MCV 93.1 80.0 - 100.0 fL   MCH 29.3 26.0 - 34.0 pg   MCHC 31.5 30.0 - 36.0 g/dL   RDW 22.2 (H) 11.5 - 15.5 %   Platelets 100 (L) 150 - 400 K/uL    Comment: REPEATED TO VERIFY Immature Platelet Fraction may be clinically indicated, consider ordering this additional test [id number] CONSISTENT WITH PREVIOUS RESULT    nRBC 0.1 0.0 - 0.2 %   Neutrophils Relative % 71 %   Neutro Abs 27.0 (H) 1.7 - 7.7 K/uL   Lymphocytes Relative 1 %   Lymphs Abs 0.5 (L) 0.7 - 4.0 K/uL   Monocytes Relative 12 %   Monocytes Absolute 4.8 (H) 0.1 - 1.0 K/uL   Eosinophils Relative 0 %   Eosinophils Absolute 0.0 0.0 - 0.5 K/uL   Basophils Relative 0 %    Basophils Absolute 0.1 0.0 - 0.1 K/uL   WBC Morphology      MODERATE LEFT SHIFT (>5% METAS AND MYELOS,OCC PRO NOTED)   Immature Granulocytes 16 %   Abs Immature Granulocytes 6.23 (H) 0.00 - 0.07 K/uL   Burr Cells PRESENT     Comment: Performed at [facility], [address] [place], [place] [address]  Pathologist smear review     Status: None   Collection Time: 11/09/20  3:44 AM  Result Value Ref Range   Path Review Leukocytosis with left shift     Comment: Normocytic anemia Thrombocytopenia Reviewed by Charolett Bumpers. Jeannie Done, M.D. 11/09/2020 Performed at [facility], [place] [address], [place], [place] [address]   Glucose, capillary     Status: None   Collection Time: 11/09/20  4:36 AM  Result Value Ref Range   Glucose-Capillary 75 70 - 99 mg/dL    Comment: Glucose reference range applies only to samples taken after fasting for at least 8 hours.  Glucose, capillary     Status: None   Collection Time: 11/09/20  7:41 AM  Result Value Ref Range   Glucose-Capillary 79 70 - 99 mg/dL    Comment: Glucose reference range applies only to samples taken after fasting for at least 8 hours.  Glucose, capillary     Status: None   Collection Time: 11/09/20 12:05 PM  Result Value Ref Range   Glucose-Capillary 94 70 - 99 mg/dL    Comment: Glucose reference range applies only to samples taken after fasting for at least 8 hours.  Glucose, capillary     Status: Abnormal   Collection Time: 11/09/20  4:44 PM  Result Value Ref Range   Glucose-Capillary 154 (H) 70 - 99 mg/dL    Comment: Glucose reference range applies only to samples taken after fasting for at least 8 hours.  Glucose, capillary     Status: Abnormal   Collection Time: 11/09/20  9:11 PM  Result Value Ref Range   Glucose-Capillary 167 (H) 70 - 99 mg/dL    Comment: Glucose reference range applies only to samples taken after fasting for at least 8 hours.  Glucose, capillary     Status: Abnormal   Collection Time: 11/09/20  11:37 PM  Result Value Ref Range   Glucose-Capillary 113 (H) 70 - 99 mg/dL    Comment: Glucose reference range applies only to samples taken after fasting for at least 8 hours.  Magnesium     Status: None   Collection Time: 11/10/20  4:15 AM  Result Value Ref Range   Magnesium 2.1 1.7 - 2.4 mg/dL    Comment: Performed at [facility] [address], [place], [place] [address]  Glucose, capillary     Status: None   Collection Time: 11/10/20  4:15 AM  Result Value Ref Range   Glucose-Capillary 74 70 - 99 mg/dL    Comment: Glucose reference range applies only to samples taken after fasting for at least 8 hours.  Glucose, capillary     Status: None   Collection Time: 11/10/20  7:37 AM  Result Value Ref Range   Glucose-Capillary 90 70 - 99 mg/dL    Comment: Glucose reference range applies only to samples taken after fasting for at least 8 hours.  Glucose, capillary     Status: Abnormal   Collection Time: 11/10/20 11:18 AM  Result Value Ref Range   Glucose-Capillary 121 (H) 70 - 99 mg/dL    Comment: Glucose reference range applies only to samples taken after fasting for at least 8 hours.   No results found.     Medical Problem List and Plan: 1.  Generalized weakness affecting ability to stand or complete ADL tasks secondary to debility.  -patient may shower as tolerated  -ELOS/Goals: 18-22 days/Min A/supervision  Admit to CIR 2.  Antithrombotics: -DVT/anticoagulation:  Mechanical: Sequential compression devices, below knee Bilateral lower extremities due to GI bleed.  -antiplatelet therapy: N/A 3. Pain Management: Tylenol  prn.  4. Mood: LCSW to follow for evaluation and support.   -antipsychotic agents: N/a 5. Neuropsych: This patient is capable of making decisions on his own behalf. 6. Skin/Wound Care: Routine pressure relief measures.  7. Fluids/Electrolytes/Nutrition: Strict I/O.  Labs with HD. 8.  ESRD: On midodrine prior to HD to help support BP.  9. Chronic  systolic CHF/CAF: Strict I/Os. Fluid status managed with HD. On Amiodarone 400 mg bid for rate control.   Daily weights  Monitor heart rate with increased activity 10.  Pancreatitis: Has completed antibiotic course. Tolerating diet without nausea, diarrhea or abdominal pain.  11. LGIB: On Carafate ac/hs.  12. Acute on chronic anemia: On aranresp weekly.   Labs with HD 13. Thrombocytopenia: Monitor for signs of bleeding.    Labs with HD 14. Acute respiratory failure: Has resolved. prednisone 40 mg daily   Bary Leriche, PA-C 11/10/2020  I have personally performed a face to face diagnostic evaluation, including, but not limited to relevant history and physical exam findings, of this patient and developed relevant assessment and plan.  Additionally, I have reviewed and concur with the physician assistant's documentation above.  Delice Lesch, MD, ABPMR

## 2020-11-10 NOTE — Progress Notes (Signed)
Inpatient Rehabilitation Medication Review by a Pharmacist  A complete drug regimen review was completed for this patient to identify any potential clinically significant medication issues.  Clinically significant medication issues were identified:  no  Check AMION for pharmacist assigned to patient if future medication questions/issues arise during this admission.  Pharmacist comments:   Time spent performing this drug regimen review (minutes):  5   Alejah Aristizabal 11/10/2020 5:20 PM

## 2020-11-10 NOTE — Progress Notes (Deleted)
PROGRESS NOTE  Raymond Hurley. ZHY:[id number] DOB: 11/20/1930 DOA: 10/14/2020 PCP: Lajean Manes, MD   LOS: 27 days   Brief Narrative / Interim history: 87 year old male who was admitted to the hospital on 1/1 with hypotension in the setting of acute diarrhea, was also diagnosed with Covid and dehydration.  Hospital course complicated by pancreatitis, GI bleed, respiratory failure, possible iliac abscess, metabolic encephalopathy, renal failure requiring HD.  Overall improving and awaiting placement to inpatient rehab  Subjective / 24h Interval events: He is doing very well this morning, denies any chest pain, denies any shortness of breath.  Tells me he had a good day yesterday and slept well overnight  Assessment & Plan: Principal Problem Acute renal failure, currently on HD -Suspect secondary to ATN after septic shock, nephrology consulted and currently on dialysis.  Status post [facility] placement on 1/25, currently on HD MWF  Active Problems Septic shock, resolved, possible iliac abscess -Case discussed on 1/7 with IR/surgery, no surgery or drain indicated.  He has completed meropenem and linezolid, follow fever curve / symptoms -Repeat CT on 1/25 shows stable thickening of the right iliacus muscle, increased thickening of the right psoas muscle without definite abscess seen.  Monitor off antibiotics  COVID-19, acute hypoxic respiratory failure -Resolved, now off precautions  Acute metabolic encephalopathy, multifactorial -Resolved  Acute pancreatitis -Treated conservatively, initially had an NG tube that has been discontinued and currently eating well.  Repeat CT abdomen pelvis on 1/25 showed possible early pseudocyst formation.  Evaluated by GI and recommended MRCP with contrast after acute kidney injury has resolved but currently is on dialysis and not appropriate for MRI  Paroxysmal A. Fib, chronic diastolic CHF -Echo done on 1/3 showed an EF of 55-60%, in and out of A. fib,  he was initially on heparin but this has been stopped due to hematochezia on 1/16.  Continue amiodarone  Hematochezia, concern for GI bleed requiring multiple transfusion, acute blood loss anemia -GI consulted, not a candidate for endoscopy given stable labs and high risk procedure.  Monitor hemoglobin  Scheduled Meds: . sodium chloride   Intravenous Once  . sodium chloride   Intravenous Once  . acetaminophen (TYLENOL) oral liquid 160 mg/5 mL  650 mg Oral Q6H  . amiodarone  400 mg Per Tube BID  . B-complex with vitamin C  1 tablet Per Tube Daily  . Chlorhexidine Gluconate Cloth  6 each Topical Q1200  . Chlorhexidine Gluconate Cloth  6 each Topical Q0600  . darbepoetin (ARANESP) injection - DIALYSIS  100 mcg Intravenous Q Tue-HD  . [START ON 11/18/2020] darbepoetin (ARANESP) injection - DIALYSIS  60 mcg Intravenous Q Sat-HD  . dextrose  1 ampule Intravenous Once  . feeding supplement (NEPRO CARB STEADY)  237 mL Oral TID BM  . Gerhardt's butt cream   Topical TID  . insulin aspart  3-9 Units Subcutaneous Q4H  . lactobacillus acidophilus  2 tablet Oral TID  . lidocaine  1 patch Transdermal Q24H  . mouth rinse  15 mL Mouth Rinse BID  . midodrine  10 mg Oral Q M,W,F-HD  . phenol  1 spray Mouth/Throat Once  . predniSONE  40 mg Per Tube Q breakfast  . sodium chloride flush  10-40 mL Intracatheter Q12H  . sodium chloride flush  3 mL Intravenous Q12H  . sucralfate  1 g Oral TID WC & HS  . triamcinolone 0.1 % cream : eucerin   Topical BID   Continuous Infusions: . sodium chloride Stopped (10/27/20  0931)   PRN Meds:.sodium chloride, chlorpheniramine-HYDROcodone, fentaNYL (SUBLIMAZE) injection, loperamide, Resource ThickenUp Clear, sodium chloride flush  Diet Orders (From admission, onward)    Start     Ordered   11/06/20 1745  Diet renal with fluid restriction Fluid restriction: 1200 mL Fluid; Room service appropriate? Yes; Fluid consistency: Thin  Diet effective now       Question Answer  Comment  Fluid restriction: 1200 mL Fluid   Room service appropriate? Yes   Fluid consistency: Thin      11/06/20 1744          DVT prophylaxis: Place and maintain sequential compression device Start: 10/29/20 0522 Place and maintain sequential compression device Start: 10/14/20 8341     Code Status: DNR  Family Communication: no family at bedside   Status is: Inpatient  Remains inpatient appropriate because:Inpatient level of care appropriate due to severity of illness   Dispo: The patient is from: Home              Anticipated d/c is to: CIR              Anticipated d/c date is: 2 days              Patient currently is not medically stable to d/c.   Difficult to place patient Yes  Level of care: Progressive  Consultants:  Nephrology Palliative GI Cardiology PCCM  Procedures:  2D echo HD  Microbiology  None   Antimicrobials: None     Objective: Vitals:   11/09/20 2041 11/09/20 2333 11/10/20 0416 11/10/20 0451  BP: (!) 116/53 (!) 123/57 (!) 106/56   Pulse: 79 82 74   Resp: 19 19 19    Temp:  98.2 F (36.8 C) 98.2 F (36.8 C)   TempSrc: Oral Oral Oral   SpO2: 95% 96% 95%   Weight:    90.1 kg  Height:        Intake/Output Summary (Last 24 hours) at 11/10/2020 0648 Last data filed at 11/09/2020 2000 Gross per 24 hour  Intake 600 ml  Output 2500 ml  Net -1900 ml   Filed Weights   11/09/20 1355 11/09/20 1634 11/10/20 0451  Weight: 95.9 kg 93.4 kg 90.1 kg    Examination:  Constitutional: NAD Eyes: no scleral icterus ENMT: Mucous membranes are moist.  Neck: normal, supple Respiratory: clear to auscultation bilaterally, no wheezing, no crackles. Normal respiratory effort.  Cardiovascular: Regular rate and rhythm, no murmurs / rubs / gallops. 1+ LE edema.  Abdomen: non distended, no tenderness. Bowel sounds positive.  Musculoskeletal: no clubbing / cyanosis.  Skin: no rashes Neurologic: CN 2-12 grossly intact. Strength 5/5 in all 4.   Data  Reviewed: I have independently reviewed following labs and imaging studies   CBC: Recent Labs  Lab 11/06/20 0027 11/06/20 1007 11/07/20 0200 11/08/20 0223 11/09/20 0344  WBC 39.3* 46.6* 41.4* 42.3* 38.6*  NEUTROABS  --   --   --   --  27.0*  HGB 8.1* 8.4* 7.8* 7.4* 7.6*  HCT 25.0* 24.4* 23.6* 22.0* 24.1*  MCV 89.3 88.4 89.4 87.6 93.1  PLT 75* 84* 86* 92* 962*   Basic Metabolic Panel: Recent Labs  Lab 11/04/20 0423 11/05/20 0315 11/06/20 0027 11/07/20 0200 11/08/20 0223 11/09/20 0344 11/10/20 0415  NA 136 138 137 133*  --  136  --   K 4.0 4.3 4.6 4.2  --  4.9  --   CL 97* 101 101 97*  --  100  --  CO2 22 20* 17* 16*  --  16*  --   GLUCOSE 93 60* 123* 117*  --  74  --   BUN 65* 88* 110* 135*  --  109*  --   CREATININE 4.78* 6.07* 7.26* 8.52*  --  7.50*  --   CALCIUM 7.8* 7.8* 7.8* 7.8*  --  8.3*  --   MG 2.0 2.1 2.2 2.3 2.0 2.2 2.1  PHOS 8.3* 9.8* 10.6*  --   --  9.4*  --    Liver Function Tests: Recent Labs  Lab 11/05/20 0315 11/06/20 0027 11/06/20 1007 11/07/20 0200 11/09/20 0344  AST  --   --  32 26  --   ALT  --   --  25 21  --   ALKPHOS  --   --  125 109  --   BILITOT  --   --  1.1 1.0  --   PROT  --   --  4.9* 4.5*  --   ALBUMIN 1.7* 1.8* 2.0* 1.7* 2.0*   Coagulation Profile: No results for input(s): INR, PROTIME in the last 168 hours. HbA1C: No results for input(s): HGBA1C in the last 72 hours. CBG: Recent Labs  Lab 11/09/20 1205 11/09/20 1644 11/09/20 2111 11/09/20 2337 11/10/20 0415  GLUCAP 94 154* 167* 113* 74    No results found for this or any previous visit (from the past 240 hour(s)).   Radiology Studies: No results found.  Marzetta Board, MD, PhD [facility]  Between 7 am - 7 pm I am available, please contact me via Amion or Securechat  Between 7 pm - 7 am I am not available, please contact night coverage MD/APP via Amion

## 2020-11-10 NOTE — PMR Pre-admission (Signed)
PMR Admission Coordinator Pre-Admission Assessment  Patient: Raymond Hurley. is an 87 y.o., male MRN: [id number] DOB: 1931-10-08 Height: 5\' 8"  (172.7 cm) Weight: 90.1 kg  Insurance Information HMO:     PPO:      PCP:      IPA:      80/20:      OTHER:  PRIMARY: Medicare A and B      Policy#: [id number]      Subscriber: pt CM Name:       Phone#:      Fax#:  Pre-Cert#: verified Civil engineer, contracting:  Benefits:  Phone #:      Name:  Eff. Date: 03/14/98 A and B     Deduct: $1556      Out of Pocket Max: n/a      Life Max: n/a CIR: 100%      SNF: 20 full days Outpatient: 80%     Co-ins: 20% Home Health: 100%      Co-Pay:  DME: 80%     Co-Ins: 20% Providers:  SECONDARY: BCBS Supplement      Policy#: -[id number]     Phone#: [phone]  Financial Counselor:       Phone#:   The "Data Collection Information Summary" for patients in Inpatient Rehabilitation Facilities with attached "Privacy Act [place] Records" was provided and verbally reviewed with: Family  Emergency Contact Information Contact Information    Name Relation Home Work [place] Spouse [phone]  [phone]   Raymond Hurley Daughter   [phone]   Odell, Choung   [phone]      Current Medical History  Patient Admitting Diagnosis: debility  History of Present Illness: Raymond Hurley (TG) is an 87 y/o male with PMH of afib, on chronic anticoagulation, systolic CHF with last EF 45%, melanoma, a vitamin B12 deficiency, who presented to [facility] hospital on 10/14/20 after being found down with AMS. Pt had tested positive for Covid-19 4 days prior to admission.  Pt was fully vaccinated with booster about 4 months ago.  Prior to admission pt with c/o ongoing diarrhea.  Hospital course complicated with acute renal failure, septic shock, respiratory failure, pancreatitis, and possible GI bleed.  Pt required CRRT and now on hemodialysis, MWF schedule.  TDC placed on 1/25.  Pancreatitis treated  conservatively and now resolved.  There was concern for GI bleed, but pt not a candidate for endoscopy given stable labs and high risk for procedure.  Plan to monitor hemoglobin and remain off anticoagulation for now.  Therapy ongoing and pt progressing well, so recommendations are for f/u with CIR for intensive therapy   Complete NIHSS TOTAL: 2  Patient[facility] from [facility] has been reviewed by the rehabilitation admission coordinator and physician.  Past Medical History  Past Medical History:  Diagnosis Date  . Atrial fibrillation ([place])   . CAD (coronary artery disease) 04/2019  . CHF (congestive heart failure) ([place])   . Melanoma ([place])   . Vitamin B 12 deficiency     Family History   family history is not on file.  Prior Rehab/Hospitalizations Has the patient had prior rehab or hospitalizations prior to admission? No  Has the patient had major surgery during 100 days prior to admission? Yes   Current Medications  Current Facility-Administered Medications:  .  0.9 %  sodium chloride infusion (Manually program via Guardrails IV Fluids), , Intravenous, Once, Little Ishikawa, MD .  0.9 %  sodium chloride infusion (Manually program via Guardrails IV Fluids), , Intravenous, Once, Mansy, Jan A, MD .  0.9 %  sodium chloride infusion, , Intravenous, PRN, Icard, Bradley L, DO, Stopped at 10/27/20 0931 .  acetaminophen (TYLENOL) 160 MG/5ML solution 650 mg, 650 mg, Oral, Q6H, Kyle, Tyrone A, DO, 650 mg at 11/10/20 0449 .  amiodarone (PACERONE) tablet 400 mg, 400 mg, Per Tube, BID, Little Ishikawa, MD, 400 mg at 11/10/20 0844 .  B-complex with vitamin C tablet 1 tablet, 1 tablet, Per Tube, Daily, Little Ishikawa, MD, 1 tablet at 11/10/20 [phone] .  Chlorhexidine Gluconate Cloth 2 % PADS 6 each, 6 each, Topical, Q1200, Mannam, Praveen, MD, 6 each at 11/10/20 0845 .  Chlorhexidine Gluconate Cloth 2 % PADS 6 each, 6 each, Topical, Q0600, Rosita Fire, MD,  6 each at 11/10/20 0449 .  chlorpheniramine-HYDROcodone (TUSSIONEX) 10-8 MG/5ML suspension 5 mL, 5 mL, Per Tube, Q12H PRN, Mannam, Praveen, MD .  Darbepoetin Alfa (ARANESP) injection 100 mcg, 100 mcg, Intravenous, Q Tue-HD, Justin Mend, MD .  Derrill Memo ON 11/18/2020] Darbepoetin Alfa (ARANESP) injection 60 mcg, 60 mcg, Intravenous, Q Sat-HD, Rosita Fire, MD .  dextrose 50 % solution 50 mL, 1 ampule, Intravenous, Once, Mansy, Jan A, MD .  feeding supplement (NEPRO CARB STEADY) liquid 237 mL, 237 mL, Oral, TID BM, Little Ishikawa, MD, 237 mL at 11/10/20 0844 .  fentaNYL (SUBLIMAZE) injection 25 mcg, 25 mcg, Intravenous, Q2H PRN, Mannam, Praveen, MD, 25 mcg at 10/26/20 0415 .  Gerhardt's butt cream, , Topical, TID, Marshell Garfinkel, MD, Given at 11/10/20 0846 .  insulin aspart (novoLOG) injection 3-9 Units, 3-9 Units, Subcutaneous, Q4H, Mannam, Praveen, MD, 6 Units at 11/09/20 2159 .  lactobacillus acidophilus (BACID) tablet 2 tablet, 2 tablet, Oral, TID, Kyle, Tyrone A, DO, 2 tablet at 11/10/20 0844 .  lidocaine (LIDODERM) 5 % 1 patch, 1 patch, Transdermal, Q24H, Little Ishikawa, MD, 1 patch at 11/09/20 1705 .  loperamide (IMODIUM) capsule 4 mg, 4 mg, Oral, Q6H PRN, Shalhoub, Sherryll Burger, MD, 4 mg at 11/08/20 2226 .  MEDLINE mouth rinse, 15 mL, Mouth Rinse, BID, Mannam, Praveen, MD, 15 mL at 11/10/20 0846 .  midodrine (PROAMATINE) tablet 10 mg, 10 mg, Oral, Q M,W,F-HD, Justin Mend, MD, 10 mg at 11/08/20 1055 .  phenol (CHLORASEPTIC) mouth spray 1 spray, 1 spray, Mouth/Throat, Once, Mannam, Praveen, MD .  predniSONE (DELTASONE) tablet 40 mg, 40 mg, Per Tube, Q breakfast, Little Ishikawa, MD, 40 mg at 11/10/20 2423 .  Resource [employer], , Per Tube, PRN, Little Ishikawa, MD .  sodium chloride flush (NS) 0.9 % injection 10-40 mL, 10-40 mL, Intracatheter, Q12H, Mannam, Praveen, MD, 10 mL at 11/10/20 0846 .  sodium chloride flush (NS) 0.9 % injection 10-40 mL, 10-40  mL, Intracatheter, PRN, Mannam, Praveen, MD, 10 mL at 11/06/20 0322 .  sodium chloride flush (NS) 0.9 % injection 3 mL, 3 mL, Intravenous, Q12H, Mannam, Praveen, MD, 3 mL at 11/10/20 0844 .  sucralfate (CARAFATE) 1 GM/10ML suspension 1 g, 1 g, Oral, TID WC & HS, Kyle, Tyrone A, DO, 1 g at 11/10/20 0808 .  triamcinolone 0.1 % cream : eucerin cream, 1:1, , Topical, BID, Norval Morton, MD, Given at 11/10/20 [phone]  Patients Current Diet:  Diet Order            Diet renal with fluid restriction Fluid restriction: 1200 mL Fluid; Room service appropriate? Yes; Fluid consistency: Thin  Diet effective now                 Precautions / Restrictions Precautions Precautions: Fall,Other (comment) Precaution Comments: Bowel incontinence (apparently had flexiseal removed yesterday) Restrictions Weight Bearing Restrictions: No   Has the patient had 2 or more falls or a fall with injury in the past year? No  Prior Activity Level Community (5-7x/wk): per wife, independent prior to admit, no DME used, working  Prior Functional Level Self Care: Did the patient need help bathing, dressing, using the toilet or eating? Independent  Indoor Mobility: Did the patient need assistance with walking from room to room (with or without device)? Independent  Stairs: Did the patient need assistance with internal or external stairs (with or without device)? Independent  Functional Cognition: Did the patient need help planning regular tasks such as shopping or remembering to take medications? Independent  Home Assistive Devices / Equipment Home Assistive Devices/Equipment: None Home Equipment: Shower seat,Grab bars - tub/shower  Prior Device Use: Indicate devices/aids used by the patient prior to current illness, exacerbation or injury? None of the above  Current Functional Level Cognition  Overall Cognitive Status: Within Functional Limits for tasks assessed Difficult to assess due to: Hard of  hearing/deaf Current Attention Level: Selective Orientation Level: Oriented X4 Safety/Judgement: Decreased awareness of deficits General Comments: multimodal cues for initiating/sequencing mobility tasks today    Extremity Assessment (includes Sensation/Coordination)  Upper Extremity Assessment: Generalized weakness  Lower Extremity Assessment: Defer to PT evaluation    ADLs  Overall ADL's : Needs assistance/impaired Eating/Feeding: Set up,Sitting Grooming: Wash/dry hands,Wash/dry face,Set up,Sitting Grooming Details (indicate cue type and reason): EOB Upper Body Bathing: Moderate assistance,Sitting Lower Body Bathing: Moderate assistance,Sitting/lateral leans Lower Body Bathing Details (indicate cue type and reason): assist for back Upper Body Dressing : Minimal assistance,Sitting Lower Body Dressing: Total assistance,Bed level Lower Body Dressing Details (indicate cue type and reason): for socks Toilet Transfer: Moderate assistance,+2 for physical assistance,+2 for safety/equipment,BSC Toilet Transfer Details (indicate cue type and reason): initially light mod A +2 and with each sit<>stand increased assist required Toileting- Clothing Manipulation and Hygiene: Maximal assistance,+2 for physical assistance,+2 for safety/equipment,Sit to/from stand Toileting - Clothing Manipulation Details (indicate cue type and reason): pt on bed pan upon arrival, totalA for pericare after Functional mobility during ADLs: Moderate assistance,+2 for physical assistance,+2 for safety/equipment (stedy) General ADL Comments: greatly improving and very motivated to return to PLOF    Mobility  Overal bed mobility: Needs Assistance Bed Mobility: Sidelying to Sit Rolling: Mod assist Sidelying to sit: Mod assist,HOB elevated Supine to sit: Max assist,+2 for safety/equipment Sit to supine: Mod assist Sit to sidelying: +2 for physical assistance,Mod assist General bed mobility comments: Assist to bring  legs off of bed, elevate trunk into sitting.    Transfers  Overall transfer level: Needs assistance Equipment used: Ambulation equipment used,Rolling walker (2 wheeled) Transfer via Lift Equipment: Stedy Transfers: Sit to/from Stand Sit to Stand: +2 physical assistance,Mod assist,Min assist Stand pivot transfers: Mod assist,+2 safety/equipment General transfer comment: +2 min assist to stand with Stedy from elevate surface. Use of bed pad to bring hips up. +2 mod assist to bring hips up from low recliner and using walker. Bed to chair with Stedy    Ambulation / Gait / Stairs / Wheelchair Mobility  Ambulation/Gait Ambulation/Gait assistance: +2 physical assistance,Mod assist Gait Distance (Feet): 1 Feet Assistive device: Rolling walker (2 wheeled) Gait Pattern/deviations: Step-to pattern,Decreased step length - right,Decreased step length - left,Leaning posteriorly (knee hyperextension)  General Gait Details: Assist for balance and support. Gait velocity: decr Gait velocity interpretation: <1.31 ft/sec, indicative of household ambulator    Posture / Balance Dynamic Sitting Balance Sitting balance - Comments: UE support for balance Balance Overall balance assessment: Needs assistance Sitting-balance support: Feet supported,Bilateral upper extremity supported Sitting balance-Leahy Scale: Poor Sitting balance - Comments: UE support for balance Standing balance support: Bilateral upper extremity supported Standing balance-Leahy Scale: Poor Standing balance comment: Static standing with Stedy or walker with +2 min assist. Stood x 2 with Stedy x 30-40 sec. Stood x 1 with walker    Special needs/care consideration Dialysis: Hemodialysis Monday, Wednesday and Friday, Diabetic management yes and Designated visitor [place] (from acute therapy documentation) Living Arrangements: Spouse/significant other Available Help at Discharge: Family,Available 24  hours/day Type of Home: [place]: One level,Other (Comment),Laundry or work area in [place] Access: Level entry (at front) Bathroom Shower/Tub: Chiropodist: [place]: No Additional Comments: spouse is healthy; son works for family business  Discharge Living Setting Plans for Discharge Living Setting: Patient's home Type of Home at Discharge: House Discharge [place]: One level,Laundry or work area in basement Discharge Home Access: Stairs to enter Entrance Stairs-Rails: None Entrance Stairs-Number of Steps: 1 step up through primary entry Discharge Bathroom Shower/Tub: Walk-in shower (per wife, has 70" step over) Discharge Bathroom Toilet: Standard Discharge Bathroom Accessibility: Yes How Accessible: Accessible via walker Does the patient have any problems obtaining your medications?: No  Social/Family/Support Systems Patient Roles: Spouse Contact Information: Avyaan Summer Anticipated Caregiver: spouse, Vicente Males Anticipated Ambulance person Information: [phone] Ability/Limitations of Caregiver: none, supportive family Caregiver Availability: 24/7 Discharge Plan Discussed with Primary Caregiver: Yes Is Caregiver In Agreement with Plan?: Yes Does Caregiver/Family have Issues with Lodging/Transportation while Pt is in Rehab?: No  Goals Patient/Family Goal for Rehab: PT/OT/SLP supervision to mod I Expected length of stay: 15-18 days Additional Information: 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  Pt with good family support and potential to reach supervision level or better.   Patient Condition: I have reviewed medical records from [facility], spoken with CSW, and spouse. I discussed via phone for inpatient rehabilitation assessment.  Patient will benefit from ongoing PT, OT and SLP, can actively participate in 3 hours of therapy a day 5 days of the week, and can make measurable gains during the admission.  Patient will also benefit from the coordinated team approach during an Inpatient Acute Rehabilitation admission.  The patient will receive intensive therapy as well as Rehabilitation physician, nursing, social worker, and care management interventions.  Due to bladder management, bowel management, safety, skin/wound care, disease management, medication administration, pain management and patient education the patient requires 24 hour a day rehabilitation nursing.  The patient is currently mod +2 with mobility and basic ADLs.  Discharge setting and therapy post discharge at home with home health is anticipated.  Patient has agreed to participate in the Acute Inpatient Rehabilitation Program and will admit today.  Preadmission Screen Completed By:  Michel Santee, PT, DPT 11/10/2020 10:48 AM ______________________________________________________________________   Discussed status with Dr. Posey Pronto on 11/10/20  at 11:09 AM and received approval for admission today.  Admission Coordinator:  Michel Santee, PT,  DPT, Time 11:09 AM Sudie Grumbling 11/10/20    Assessment/Plan: Diagnosis: debility  1. Does the need for close, 24 hr/day Medical supervision in concert with the patient's rehab needs make it unreasonable for this patient to be served in a less intensive setting? Yes  2. Co-Morbidities requiring supervision/potential complications: afib, on chronic anticoagulation, systolic CHF (Monitor in accordance with increased physical activity and avoid UE resistance excercises), melanoma, a vitamin B12 deficiency, 3. Due to safety, disease management and patient education, does the patient require 24 hr/day rehab nursing? Yes 4. Does the  patient require coordinated care of a physician, rehab nurse, PT, OT to address physical and functional deficits in the context of the above medical diagnosis(es)? Yes Addressing deficits in the following areas: balance, endurance, locomotion, strength, transferring, bathing, dressing, toileting and psychosocial support 5. Can the patient actively participate in an intensive therapy program of at least 3 hrs of therapy 5 days a week? Yes 6. The potential for patient to make measurable gains while on inpatient rehab is excellent 7. Anticipated functional outcomes upon discharge from inpatient rehab: supervision and min assist PT, supervision and min assist OT, n/a SLP 8. Estimated rehab length of stay to reach the above functional goals is: 12-16 days. 9. Anticipated discharge destination: Home 10. Overall Rehab/Functional Prognosis: good   MD Signature: Delice Lesch, MD, ABPMR

## 2020-11-10 NOTE — Progress Notes (Signed)
Report given to Vonna Kotyk, RN at [facility].

## 2020-11-10 NOTE — Consult Note (Signed)
   [facility] CM Inpatient Consult   11/10/2020  Armando Bukhari. 1931/06/24 [id number]   Follow up: Medicare  LLOS  Progress note reviewed from inpatient [facility] team, Renal Navigator, and PT/OT recommendations for LLOS progress in patient with high score for unplanned readmission risk and notes that disposition has changed for transition of care to inpatient rehabilitation level of care.  Plan: If patient transitions to a inpatient rehabilitation facility then transition of care from acute inpatient will be at [facility] level of care. Will follow progress intermittently if transitions to [place].  Natividad Brood, RN BSN [facility] Liaison  [phone] business mobile phone Toll free office [phone]  Fax number: [phone] [email or web address]

## 2020-11-10 NOTE — Progress Notes (Signed)
Renal Navigator appreciates update from CIR AC/C. Cletus Gash that patient will be admitted to CIR today. Navigator will refer patient for outpatient HD seat when he nears discharge from CIR.   Alphonzo Cruise, [place] Renal Navigator [phone]

## 2020-11-10 NOTE — Plan of Care (Signed)
  Problem: Education: Goal: Knowledge of disease or condition will improve Outcome: Progressing Goal: Knowledge of secondary prevention will improve Outcome: Progressing Goal: Knowledge of patient specific risk factors addressed and post discharge goals established will improve Outcome: Progressing   Problem: Coping: Goal: Will verbalize positive feelings about self Outcome: Progressing Goal: Will identify appropriate support needs Outcome: Progressing   Problem: Health Behavior/Discharge Planning: Goal: Ability to manage health-related needs will improve Outcome: Progressing   Problem: Education: Goal: Knowledge of General Education information will improve Description: Including pain rating scale, medication(s)/side effects and non-pharmacologic comfort measures Outcome: Progressing   Problem: Health Behavior/Discharge Planning: Goal: Ability to manage health-related needs will improve Outcome: Progressing   Problem: Clinical Measurements: Goal: Ability to maintain clinical measurements within normal limits will improve Outcome: Progressing Goal: Will remain free from infection Outcome: Progressing Goal: Diagnostic test results will improve Outcome: Progressing Goal: Respiratory complications will improve Outcome: Progressing Goal: Cardiovascular complication will be avoided Outcome: Progressing   Problem: Activity: Goal: Risk for activity intolerance will decrease Outcome: Progressing   Problem: Nutrition: Goal: Adequate nutrition will be maintained Outcome: Progressing   Problem: Coping: Goal: Level of anxiety will decrease Outcome: Progressing   Problem: Elimination: Goal: Will not experience complications related to bowel motility Outcome: Progressing   Problem: Pain Managment: Goal: General experience of comfort will improve Outcome: Progressing   Problem: Safety: Goal: Ability to remain free from injury will improve Outcome: Progressing    Problem: Skin Integrity: Goal: Risk for impaired skin integrity will decrease Outcome: Progressing   Problem: Education: Goal: Knowledge of risk factors and measures for prevention of condition will improve Outcome: Progressing   Problem: Coping: Goal: Psychosocial and spiritual needs will be supported Outcome: Progressing   Problem: Respiratory: Goal: Will maintain a patent airway Outcome: Progressing Goal: Complications related to the disease process, condition or treatment will be avoided or minimized Outcome: Progressing   Problem: Education: Goal: Knowledge of secondary prevention will improve Outcome: Progressing   Problem: Education: Goal: Knowledge of disease and its progression will improve Outcome: Progressing   Problem: Health Behavior/Discharge Planning: Goal: Ability to manage health-related needs will improve Outcome: Progressing   Problem: Clinical Measurements: Goal: Complications related to the disease process or treatment will be avoided or minimized Outcome: Progressing Goal: Dialysis access will remain free of complications Outcome: Progressing   Problem: Activity: Goal: Activity intolerance will improve Outcome: Progressing   Problem: Fluid Volume: Goal: Fluid volume balance will be maintained or improved Outcome: Progressing   Problem: Nutritional: Goal: Ability to make appropriate dietary choices will improve Outcome: Progressing   Problem: Respiratory: Goal: Respiratory symptoms related to disease process will be avoided Outcome: Progressing   Problem: Self-Concept: Goal: Body image disturbance will be avoided or minimized Outcome: Progressing   Problem: Urinary Elimination: Goal: Progression of disease will be identified and treated Outcome: Progressing

## 2020-11-10 NOTE — Progress Notes (Signed)
[facility] Progress Note     Assessment/ Plan:   1. Dialysis dependent AKI 2/2 ATN. 02/2020 Cr 1.2. 1. CRRT 1/4-1/12. And now tol iHD on MWF COVID shift 2. Patient/family desire ongoing dialysis after discussion on 1/20 3. IR to place tunneled dialysis catheter, on 1/24 4. Began clip process as AKI, SW aware 5. Tolerating HD better after addition of midodrine, last on 1/27 with UF 2.5L .  Plan next HD tomorrow.  Will try to maximize fluid removal today given persistent LE edema.  Midodrine prior.  2. Acute pancreatitis: Noted on CT scan 10/16/20 and again on 1/7. 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  3. Acute hypoxemic RF: likely aspiration PNA +/- COVID pneumonitis, now on RA. S/p speroids.  Resolved and off isolation now.   4. Shock: high WBC count, w/u per primary team. Possible ilacus abscess on CT from 1/7. Off levophed 1/11. Completed courese of Mero, zyvox. Slowly improving but WBC remains 38k.     5Acute encephalopathy: multifactorial, likely COVID + acute illness. Improved.  6.  Chronic systolic CHF: EF 0/9/81 on TTE 60-65% with no WMA.  7.  Afib: hep and amiodarone po  8. Acute anemia: GI losses + AKI/CKD. Received multiple blood transfusion.  He has a history of metastatic melanoma.  On 1/22, Dr. Carolin Sicks discussed with multiple family members via video call in the patient's room.  The family discussed with the patient's oncologist at [facility] who reportedly said no contraindication for erythropoietin to manage anemia. Being treated with IV iron and ESA .  Running in mid 7s, transfuse < 7.   9.  Thrombocytopenia:  Initially normal, into 50s now back to 100s.  No heparin with dialysis.  Further w/u per primary.    Subjective:   Tolerated 2.4L UF yesterday. Remains anuric.  Feeling good today.    Objective:   BP (!) 111/59 (BP Location: Right Arm)   Pulse 84   Temp 98.7 F  (37.1 C) (Axillary)   Resp 18   Ht 5\' 8"  (1.727 m)   Wt 90.1 kg   SpO2 95%   BMI 30.20 kg/m   Intake/Output Summary (Last 24 hours) at 11/10/2020 1004 Last data filed at 11/09/2020 2000 Gross per 24 hour  Intake 360 ml  Output 2500 ml  Net -2140 ml   Weight change: 0.2 kg  Physical Exam: GEN: Not in distress, comfortable on RA NECK: Supple, no thyromegaly LUNGS: Clear bilateral, no rhonchi CV: RRR, No M/R/G ABD: SNDNT +BS  EXT: 2+  extremity edema with some improvement over the week but still significant ACCESS: RIJ TDC site clean  Imaging: No results found.  Labs: BMET Recent Labs  Lab 11/04/20 0423 11/05/20 0315 11/06/20 0027 11/07/20 0200 11/09/20 0344  NA 136 138 137 133* 136  K 4.0 4.3 4.6 4.2 4.9  CL 97* 101 101 97* 100  CO2 22 20* 17* 16* 16*  GLUCOSE 93 60* 123* 117* 74  BUN 65* 88* 110* 135* 109*  CREATININE 4.78* 6.07* 7.26* 8.52* 7.50*  CALCIUM 7.8* 7.8* 7.8* 7.8* 8.3*  PHOS 8.3* 9.8* 10.6*  --  9.4*   CBC Recent Labs  Lab 11/06/20 1007 11/07/20 0200 11/08/20 0223 11/09/20 0344  WBC 46.6* 41.4* 42.3* 38.6*  NEUTROABS  --   --   --  27.0*  HGB 8.4* 7.8* 7.4* 7.6*  HCT 24.4* 23.6* 22.0* 24.1*  MCV 88.4 89.4 87.6 93.1  PLT 84* 86* 92* 100*    Medications:    . sodium chloride   Intravenous Once  . sodium chloride   Intravenous Once  . acetaminophen (TYLENOL) oral liquid 160 mg/5 mL  650 mg Oral Q6H  . amiodarone  400 mg Per Tube BID  . B-complex with vitamin C  1 tablet Per Tube Daily  . Chlorhexidine Gluconate Cloth  6 each Topical Q1200  . Chlorhexidine Gluconate Cloth  6 each Topical Q0600  . darbepoetin (ARANESP) injection - DIALYSIS  100 mcg Intravenous Q Tue-HD  . [START ON 11/18/2020] darbepoetin (ARANESP) injection - DIALYSIS  60 mcg Intravenous Q Sat-HD  . dextrose  1 ampule Intravenous Once  . feeding supplement (NEPRO CARB STEADY)  237 mL Oral TID BM  . Gerhardt's butt cream   Topical TID  . insulin aspart  3-9 Units  Subcutaneous Q4H  . lactobacillus acidophilus  2 tablet Oral TID  . lidocaine  1 patch Transdermal Q24H  . mouth rinse  15 mL Mouth Rinse BID  . midodrine  10 mg Oral Q M,W,F-HD  . phenol  1 spray Mouth/Throat Once  . predniSONE  40 mg Per Tube Q breakfast  . sodium chloride flush  10-40 mL Intracatheter Q12H  . sodium chloride flush  3 mL Intravenous Q12H  . sucralfate  1 g Oral TID WC & HS  . triamcinolone 0.1 % cream : eucerin   Topical BID    Justin Mend, MD  11/10/2020, 10:04 AM

## 2020-11-10 NOTE — TOC Transition Note (Signed)
Transition of Care [facility]) - CM/SW Discharge Note   Patient Details  Name: Raymond Hurley. MRN: [id number] Date of Birth: August 07, 1931  [facility] CM/SW Contact:  Zenon Mayo, RN Phone Number: 11/10/2020, 1:46 PM   Clinical Narrative:    Patient to dc to CIR today.   Final next level of care: IP Rehab Facility Barriers to Discharge: No Barriers Identified   Patient Goals and CMS Choice Patient states their goals for this hospitalization and ongoing recovery are:: Pt and family agreeable to short term SNF placement. CMS Medicare.gov Compare Post Acute Care list provided to:: Patient Represenative (must comment) (Daughter, Linus Orn) Choice offered to / list presented to : [place]  Discharge Placement                       Discharge Plan and Services     Post Acute Care Choice: [place]                               Social Determinants of Health (SDOH) Interventions     Readmission Risk Interventions No flowsheet data found.

## 2020-11-10 NOTE — Progress Notes (Signed)
[facility] Progress Note     Assessment/ Plan:   1. Dialysis dependent AKI 2/2 ATN. 02/2020 Cr 1.2. 1. CRRT 1/4-1/12. And now tol iHD on MWF COVID shift 2. Patient/family desire ongoing dialysis after discussion on 1/20 3. IR to place tunneled dialysis catheter, on 1/24 4. Began clip process as AKI, SW aware 5. Tolerating HD better after addition of midodrine, last on 1/27 with UF 2.5L .  Plan next HD tomorrow.  Will try to maximize fluid removal today given persistent LE edema.  Midodrine prior.  2. Acute pancreatitis: Noted on CT scan 10/16/20 and again on 1/7. 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  3. Acute hypoxemic RF: likely aspiration PNA +/- COVID pneumonitis, now on RA. S/p speroids.  Resolved and off isolation now.   4. Shock: high WBC count, w/u per primary team. Possible ilacus abscess on CT from 1/7. Off levophed 1/11. Completed courese of Mero, zyvox. Slowly improving but WBC remains 38k.     5Acute encephalopathy: multifactorial, likely COVID + acute illness. Improved.  6.  Chronic systolic CHF: EF 06/19/77 on TTE 60-65% with no WMA.  7.  Afib: hep and amiodarone po  8. Acute anemia: GI losses + AKI/CKD. Received multiple blood transfusion.  He has a history of metastatic melanoma.  On 1/22, Dr. Carolin Sicks discussed with multiple family members via video call in the patient's room.  The family discussed with the patient's oncologist at [facility] who reportedly said no contraindication for erythropoietin to manage anemia. Being treated with IV iron and ESA .  Running in mid 7s, transfuse < 7.   9.  Thrombocytopenia:  Initially normal, into 50s now back to 100s.  No heparin with dialysis.  Further w/u per primary.    Subjective:   Scheduled for HD. Remains anuric.  Feeling good today.    Objective:   BP (!) 106/56 (BP Location: Left Arm)   Pulse 74   Temp 98.2 F (36.8 C)  (Oral)   Resp 19   Ht 5\' 8"  (1.727 m)   Wt 90.1 kg   SpO2 95%   BMI 30.20 kg/m   Intake/Output Summary (Last 24 hours) at 11/10/2020 0753 Last data filed at 11/09/2020 2000 Gross per 24 hour  Intake 600 ml  Output 2500 ml  Net -1900 ml   Weight change: 0.2 kg  Physical Exam: GEN: Not in distress, comfortable on RA NECK: Supple, no thyromegaly LUNGS: Clear bilateral, no rhonchi CV: RRR, No M/R/G ABD: SNDNT +BS  EXT: 2+  extremity edema with some improvement c/w yest but still significant ACCESS: RIJ TDC site clean  Imaging: No results found.  Labs: BMET Recent Labs  Lab 11/04/20 0423 11/05/20 0315 11/06/20 0027 11/07/20 0200 11/09/20 0344  NA 136 138 137 133* 136  K 4.0 4.3 4.6 4.2 4.9  CL 97* 101 101 97* 100  CO2 22 20* 17* 16* 16*  GLUCOSE 93 60* 123* 117* 74  BUN 65* 88* 110* 135* 109*  CREATININE 4.78* 6.07* 7.26* 8.52* 7.50*  CALCIUM 7.8* 7.8* 7.8* 7.8* 8.3*  PHOS 8.3* 9.8* 10.6*  --  9.4*   CBC Recent Labs  Lab 11/06/20 1007 11/07/20 0200 11/08/20 0223 11/09/20 0344  WBC 46.6* 41.4* 42.3* 38.6*  NEUTROABS  --   --   --  27.0*  HGB 8.4* 7.8* 7.4* 7.6*  HCT 24.4* 23.6* 22.0* 24.1*  MCV 88.4 89.4 87.6 93.1  PLT  84* 86* 92* 100*    Medications:    . sodium chloride   Intravenous Once  . sodium chloride   Intravenous Once  . acetaminophen (TYLENOL) oral liquid 160 mg/5 mL  650 mg Oral Q6H  . amiodarone  400 mg Per Tube BID  . B-complex with vitamin C  1 tablet Per Tube Daily  . Chlorhexidine Gluconate Cloth  6 each Topical Q1200  . Chlorhexidine Gluconate Cloth  6 each Topical Q0600  . darbepoetin (ARANESP) injection - DIALYSIS  100 mcg Intravenous Q Tue-HD  . [START ON 11/18/2020] darbepoetin (ARANESP) injection - DIALYSIS  60 mcg Intravenous Q Sat-HD  . dextrose  1 ampule Intravenous Once  . feeding supplement (NEPRO CARB STEADY)  237 mL Oral TID BM  . Gerhardt's butt cream   Topical TID  . insulin aspart  3-9 Units Subcutaneous Q4H  .  lactobacillus acidophilus  2 tablet Oral TID  . lidocaine  1 patch Transdermal Q24H  . mouth rinse  15 mL Mouth Rinse BID  . midodrine  10 mg Oral Q M,W,F-HD  . phenol  1 spray Mouth/Throat Once  . predniSONE  40 mg Per Tube Q breakfast  . sodium chloride flush  10-40 mL Intracatheter Q12H  . sodium chloride flush  3 mL Intravenous Q12H  . sucralfate  1 g Oral TID WC & HS  . triamcinolone 0.1 % cream : eucerin   Topical BID    Justin Mend, MD  11/10/2020, 7:53 AM

## 2020-11-10 NOTE — IPOC Note (Signed)
Individualized overall Plan of Care (IPOC) Patient Details Name: Raymond Hurley. MRN: [id number] DOB: 05-Aug-1931  Admitting Diagnosis: Debility  Hospital Problems: Principal Problem:   Debility Active Problems:   Steroid-induced hyperglycemia   Leucocytosis   Acute on chronic anemia   ESRD on dialysis [facility])     Functional Problem List: Nursing Nutrition,Bladder,Pain,Bowel,Safety,Edema,Endurance,Medication Management,Skin Integrity  PT Balance,Edema,Endurance,Pain,Sensory,Skin Integrity  OT Balance,Edema,Endurance,Motor,Nutrition,Pain,Safety,Skin Integrity  SLP    TR         Basic ADL's: OT Grooming,Bathing,Dressing,Toileting     Advanced  ADL's: OT       Transfers: PT Bed Mobility,Bed to Chair,Car,Furniture  OT Toilet,Tub/Shower     Locomotion: PT Ambulation,Stairs,Wheelchair Mobility     Additional Impairments: OT    SLP        TR      Anticipated Outcomes Item Anticipated Outcome  Self Feeding no goal  Swallowing      Basic self-care  S  Toileting  S   Bathroom Transfers S  Bowel/Bladder  to be cont x 2  Transfers  Supervision assist with LRAD  Locomotion  ambulatory at [place] level with LRAD for household distances.  Communication     Cognition     Pain  less than 3 out of 10 on pain scale  Safety/Judgment  to be fall free while in rehab   Therapy Plan: PT Intensity: Minimum of 1-2 x/day ,45 to 90 minutes PT Frequency: 5 out of 7 days PT Duration Estimated Length of Stay: 18-22 days OT Intensity: Minimum of 1-2 x/day, 45 to 90 minutes OT Frequency: 5 out of 7 days OT Duration/Estimated Length of Stay: 2.5-3 weeks SLP Duration/Estimated Length of Stay: n/a No SLP treatment recommended.    Team Interventions: Nursing Interventions Patient/Family Education,Disease Management/Prevention,Skin Care/Wound Management,Discharge Planning,Bladder Management,Pain Management,Bowel Management,Medication Management  PT  interventions Ambulation/gait training,Balance/vestibular training,Community reintegration,Cognitive remediation/compensation,Discharge planning,Disease management/prevention,DME/adaptive equipment instruction,Functional mobility training,Neuromuscular re-education,Patient/family education,Pain management,Psychosocial support,Skin care/wound management,Splinting/orthotics,Stair training,Therapeutic Exercise,UE/LE Coordination activities,Visual/perceptual remediation/compensation,Wheelchair propulsion/positioning,UE/LE Strength taining/ROM,Therapeutic Activities  OT Interventions Balance/vestibular training,Discharge planning,Functional electrical stimulation,Pain management,Self Care/advanced ADL retraining,Therapeutic Activities,UE/LE Coordination activities,Visual/perceptual remediation/compensation,Therapeutic Exercise,Skin care/wound managment,Patient/family education,Functional mobility training,Disease Programmer, applications re-education,Psychosocial support,Splinting/orthotics,UE/LE Strength taining/ROM,Wheelchair propulsion/positioning  SLP Interventions    TR Interventions    SW/CM Interventions Discharge Planning,Psychosocial Support,Patient/Family Education   Barriers to Discharge MD  Medical stability, Weight and Hemodialysis  Nursing      PT Home environment access/layout,Medication compliance,Hemodialysis    OT Decreased caregiver support,Wound Care    SLP      SW       Team Discharge Planning: Destination: PT-Home ,OT- Home , SLP-Home Projected Follow-up: PT-Home health PT, OT-  Home health OT, SLP-None Projected Equipment Needs: PT-Rolling walker with 5" wheels,Wheelchair (measurements),Wheelchair cushion (measurements), OT- 3 in 1 bedside comode,Tub/shower seat,To be determined, SLP-None recommended by SLP Equipment Details: PT- , OT-  Patient/family involved in discharge planning: PT- Patient,   OT-Patient, SLP-Patient  MD ELOS: 18-22 days. Medical Rehab Prognosis:  Good Assessment: Raymond Hurley is an 87 year old male with history of melanoma, vitamin B12 deficiency, A. fib--on Eliquis who was admitted on 10/14/2020 with acute right facial droop, dysarthria, and difficulty following commands.  Family reported patient with recent diagnosis of Covid with diarrhea and poor p.o. intake.  MRI/MRA head was unremarkable for acute intracranial process.  He was found to have leukocytosis with elevated BNP as well as acute kidney injury and elevated D-dimer.  He was treated with fluid protocol and IV antibiotics for septic shock.  CT abdomen 01/07 showed multifocal pneumonia as well as severe inflammatory changes around pancreas consistent with severe pancreatitis as well as 4.6 x 2.3 cm low-density in right iliac is muscle concerning for developing abscess.  Dr. Alessandra Bevels consulted for input and pancreatitis treated with supportive care as well as course of meropenem and linezolid. Acute renal failure felt to be due to to ATN with hypovolemia and hypotension.  He required CVVHD and has been transitioned to hemodialysis.  Acute hypoxic respiratory failure due to aspiration pneumonia treated with stress dose steroids with improvement with recommendations for slow prednisone taper.  Amiodarone was added for rate control of atrial fibrillation with RVR per cardiology input.  He has had issues with GIB due to hematochezia and required multiple transfusions and anticoagulation was discontinued.  Thrombocytopenia being monitored.  He completed antibiotic course on 10/31/2020 and follow-up CT abdomen 11/06/2020 showed severe acute pancreatitis with probable early pseudocyst formation as well as increased thickness in the right psoas muscle and no change in right iliac [place] muscle.  Current recommendations are to monitor off antibiotics. Encephalopathy improving, he is tolerating regular diet and currently noted to be  significantly debilitated with generalized weakness affecting ability to stand or complete ADL tasks.  We will set goals for supervision with PT/OT.  Due to the current state of emergency, patients may not be receiving their 3-hours of Medicare-mandated therapy.  See Team Conference Notes for weekly updates to the plan of care

## 2020-11-10 NOTE — Progress Notes (Signed)
Jamse Arn, MD  Physician  Physical Medicine and Rehabilitation  PMR Pre-admission    Signed  Date of Service:  11/10/2020 10:47 AM      Related encounter: ED to Hosp-Admission (Current) from 10/14/2020 in [facility]       Signed         Show:Clear all [x] Manual[x] Template[x] Copied  Added by: [x] Posey Pronto, Domenick Bookbinder, MD[x] Michel Santee, PT   [] Hover for details  PMR Admission Coordinator Pre-Admission Assessment  Patient: Raymond Hurley. is an 87 y.o., male MRN: [id number] DOB: 03-01-1931 Height: 5\' 8"  (172.7 cm) Weight: 90.1 kg  Insurance Information HMO:     PPO:      PCP:      IPA:      80/20:      OTHER:  PRIMARY: Medicare A and B      Policy#: [id number]      Subscriber: pt CM Name:       Phone#:      Fax#:  Pre-Cert#: verified Civil engineer, contracting:  Benefits:  Phone #:      Name:  Eff. Date: 03/14/98 A and B     Deduct: $1556      Out of Pocket Max: n/a      Life Max: n/a CIR: 100%      SNF: 20 full days Outpatient: 80%     Co-ins: 20% Home Health: 100%      Co-Pay:  DME: 80%     Co-Ins: 20% Providers:  SECONDARY: BCBS Supplement      Policy#: [id number]     Phone#: [phone]  Financial Counselor:       Phone#:   The "Data Collection Information Summary" for patients in Inpatient Rehabilitation Facilities with attached "Privacy Act [place] Records" was provided and verbally reviewed with: Family  Emergency Contact Information         Contact Information    Name Relation Home Work [place] Spouse [phone]  [phone]   [place] Daughter   [phone]   Viraat, Vanpatten   [phone]      Current Medical History  Patient Admitting Diagnosis: debility  History of Present Illness: Raymond Hurley (TG) is an 87 y/o male with PMH of afib, on chronic anticoagulation, systolic CHF with last EF 45%, melanoma, a vitamin B12 deficiency, who presented to [facility]  hospital on 10/14/20 after being found down with AMS. Pt had tested positive for Covid-19 4 days prior to admission.  Pt was fully vaccinated with booster about 4 months ago.  Prior to admission pt with c/o ongoing diarrhea.  Hospital course complicated with acute renal failure, septic shock, respiratory failure, pancreatitis, and possible GI bleed.  Pt required CRRT and now on hemodialysis, MWF schedule.  TDC placed on 1/25.  Pancreatitis treated conservatively and now resolved.  There was concern for GI bleed, but pt not a candidate for endoscopy given stable labs and high risk for procedure.  Plan to monitor hemoglobin and remain off anticoagulation for now.  Therapy ongoing and pt progressing well, so recommendations are for f/u with CIR for intensive therapy   Complete NIHSS TOTAL: 2  Patient's medical record from [facility] has been reviewed by the rehabilitation admission coordinator and physician.  Past Medical History  Past Medical History:  Diagnosis Date  . Atrial fibrillation ([place])   . CAD (coronary artery disease) 04/2019  . CHF (congestive heart failure) ([place])   .  Melanoma ([place])   . Vitamin B 12 deficiency     Family History   family history is not on file.  Prior Rehab/Hospitalizations Has the patient had prior rehab or hospitalizations prior to admission? No  Has the patient had major surgery during 100 days prior to admission? Yes             Current Medications  Current Facility-Administered Medications:  .  0.9 %  sodium chloride infusion (Manually program via Guardrails IV Fluids), , Intravenous, Once, Little Ishikawa, MD .  0.9 %  sodium chloride infusion (Manually program via Guardrails IV Fluids), , Intravenous, Once, Mansy, Jan A, MD .  0.9 %  sodium chloride infusion, , Intravenous, PRN, Icard, Bradley L, DO, Stopped at 10/27/20 0931 .  acetaminophen (TYLENOL) 160 MG/5ML solution 650 mg, 650 mg, Oral, Q6H, Kyle, Tyrone A, DO, 650 mg at  11/10/20 0449 .  amiodarone (PACERONE) tablet 400 mg, 400 mg, Per Tube, BID, Little Ishikawa, MD, 400 mg at 11/10/20 0844 .  B-complex with vitamin C tablet 1 tablet, 1 tablet, Per Tube, Daily, Little Ishikawa, MD, 1 tablet at 11/10/20 [phone] .  Chlorhexidine Gluconate Cloth 2 % PADS 6 each, 6 each, Topical, Q1200, Mannam, Praveen, MD, 6 each at 11/10/20 0845 .  Chlorhexidine Gluconate Cloth 2 % PADS 6 each, 6 each, Topical, Q0600, Rosita Fire, MD, 6 each at 11/10/20 0449 .  chlorpheniramine-HYDROcodone (TUSSIONEX) 10-8 MG/5ML suspension 5 mL, 5 mL, Per Tube, Q12H PRN, Mannam, Praveen, MD .  Darbepoetin Alfa (ARANESP) injection 100 mcg, 100 mcg, Intravenous, Q Tue-HD, Justin Mend, MD .  Derrill Memo ON 11/18/2020] Darbepoetin Alfa (ARANESP) injection 60 mcg, 60 mcg, Intravenous, Q Sat-HD, Rosita Fire, MD .  dextrose 50 % solution 50 mL, 1 ampule, Intravenous, Once, Mansy, Jan A, MD .  feeding supplement (NEPRO CARB STEADY) liquid 237 mL, 237 mL, Oral, TID BM, Little Ishikawa, MD, 237 mL at 11/10/20 0844 .  fentaNYL (SUBLIMAZE) injection 25 mcg, 25 mcg, Intravenous, Q2H PRN, Mannam, Praveen, MD, 25 mcg at 10/26/20 0415 .  Gerhardt's butt cream, , Topical, TID, Marshell Garfinkel, MD, Given at 11/10/20 0846 .  insulin aspart (novoLOG) injection 3-9 Units, 3-9 Units, Subcutaneous, Q4H, Mannam, Praveen, MD, 6 Units at 11/09/20 2159 .  lactobacillus acidophilus (BACID) tablet 2 tablet, 2 tablet, Oral, TID, Kyle, Tyrone A, DO, 2 tablet at 11/10/20 0844 .  lidocaine (LIDODERM) 5 % 1 patch, 1 patch, Transdermal, Q24H, Little Ishikawa, MD, 1 patch at 11/09/20 1705 .  loperamide (IMODIUM) capsule 4 mg, 4 mg, Oral, Q6H PRN, Shalhoub, Sherryll Burger, MD, 4 mg at 11/08/20 2226 .  MEDLINE mouth rinse, 15 mL, Mouth Rinse, BID, Mannam, Praveen, MD, 15 mL at 11/10/20 0846 .  midodrine (PROAMATINE) tablet 10 mg, 10 mg, Oral, Q M,W,F-HD, Justin Mend, MD, 10 mg at 11/08/20 1055 .   phenol (CHLORASEPTIC) mouth spray 1 spray, 1 spray, Mouth/Throat, Once, Mannam, Praveen, MD .  predniSONE (DELTASONE) tablet 40 mg, 40 mg, Per Tube, Q breakfast, Little Ishikawa, MD, 40 mg at 11/10/20 6269 .  [employer], , Per Tube, PRN, Little Ishikawa, MD .  sodium chloride flush (NS) 0.9 % injection 10-40 mL, 10-40 mL, Intracatheter, Q12H, Mannam, Praveen, MD, 10 mL at 11/10/20 0846 .  sodium chloride flush (NS) 0.9 % injection 10-40 mL, 10-40 mL, Intracatheter, PRN, Mannam, Praveen, MD, 10 mL at 11/06/20 0322 .  sodium chloride flush (NS) 0.9 %  injection 3 mL, 3 mL, Intravenous, Q12H, Mannam, Praveen, MD, 3 mL at 11/10/20 0844 .  sucralfate (CARAFATE) 1 GM/10ML suspension 1 g, 1 g, Oral, TID WC & HS, Kyle, Tyrone A, DO, 1 g at 11/10/20 0808 .  triamcinolone 0.1 % cream : eucerin cream, 1:1, , Topical, BID, Norval Morton, MD, Given at 11/10/20 [phone]  Patients Current Diet:     Diet Order                  Diet renal with fluid restriction Fluid restriction: 1200 mL Fluid; Room service appropriate? Yes; Fluid consistency: Thin  Diet effective now                  Precautions / Restrictions Precautions Precautions: Fall,Other (comment) Precaution Comments: Bowel incontinence (apparently had flexiseal removed yesterday) Restrictions Weight Bearing Restrictions: No   Has the patient had 2 or more falls or a fall with injury in the past year? No  Prior Activity Level Community (5-7x/wk): per wife, independent prior to admit, no DME used, working  Prior Functional Level Self Care: Did the patient need help bathing, dressing, using the toilet or eating? Independent  Indoor Mobility: Did the patient need assistance with walking from room to room (with or without device)? Independent  Stairs: Did the patient need assistance with internal or external stairs (with or without device)? Independent  Functional Cognition: Did the patient need help  planning regular tasks such as shopping or remembering to take medications? Independent  Home Assistive Devices / Equipment Home Assistive Devices/Equipment: None Home Equipment: Shower seat,Grab bars - tub/shower  Prior Device Use: Indicate devices/aids used by the patient prior to current illness, exacerbation or injury? None of the above  Current Functional Level Cognition  Overall Cognitive Status: Within Functional Limits for tasks assessed Difficult to assess due to: Hard of hearing/deaf Current Attention Level: Selective Orientation Level: Oriented X4 Safety/Judgement: Decreased awareness of deficits General Comments: multimodal cues for initiating/sequencing mobility tasks today    Extremity Assessment (includes Sensation/Coordination)  Upper Extremity Assessment: Generalized weakness  Lower Extremity Assessment: Defer to PT evaluation    ADLs  Overall ADL's : Needs assistance/impaired Eating/Feeding: Set up,Sitting Grooming: Wash/dry hands,Wash/dry face,Set up,Sitting Grooming Details (indicate cue type and reason): EOB Upper Body Bathing: Moderate assistance,Sitting Lower Body Bathing: Moderate assistance,Sitting/lateral leans Lower Body Bathing Details (indicate cue type and reason): assist for back Upper Body Dressing : Minimal assistance,Sitting Lower Body Dressing: Total assistance,Bed level Lower Body Dressing Details (indicate cue type and reason): for socks Toilet Transfer: Moderate assistance,+2 for physical assistance,+2 for safety/equipment,BSC Toilet Transfer Details (indicate cue type and reason): initially light mod A +2 and with each sit<>stand increased assist required Toileting- Clothing Manipulation and Hygiene: Maximal assistance,+2 for physical assistance,+2 for safety/equipment,Sit to/from stand Toileting - Clothing Manipulation Details (indicate cue type and reason): pt on bed pan upon arrival, totalA for pericare after Functional mobility  during ADLs: Moderate assistance,+2 for physical assistance,+2 for safety/equipment (stedy) General ADL Comments: greatly improving and very motivated to return to PLOF    Mobility  Overal bed mobility: Needs Assistance Bed Mobility: Sidelying to Sit Rolling: Mod assist Sidelying to sit: Mod assist,HOB elevated Supine to sit: Max assist,+2 for safety/equipment Sit to supine: Mod assist Sit to sidelying: +2 for physical assistance,Mod assist General bed mobility comments: Assist to bring legs off of bed, elevate trunk into sitting.    Transfers  Overall transfer level: Needs assistance Equipment used: Ambulation equipment used,Rolling walker (2 wheeled)  Transfer via Lift Equipment: Stedy Transfers: Sit to/from Stand Sit to Stand: +2 physical assistance,Mod assist,Min assist Stand pivot transfers: Mod assist,+2 safety/equipment General transfer comment: +2 min assist to stand with Stedy from elevate surface. Use of bed pad to bring hips up. +2 mod assist to bring hips up from low recliner and using walker. Bed to chair with Stedy    Ambulation / Gait / Stairs / Wheelchair Mobility  Ambulation/Gait Ambulation/Gait assistance: +2 physical assistance,Mod assist Gait Distance (Feet): 1 Feet Assistive device: Rolling walker (2 wheeled) Gait Pattern/deviations: Step-to pattern,Decreased step length - right,Decreased step length - left,Leaning posteriorly (knee hyperextension) General Gait Details: Assist for balance and support. Gait velocity: decr Gait velocity interpretation: <1.31 ft/sec, indicative of household ambulator    Posture / Balance Dynamic Sitting Balance Sitting balance - Comments: UE support for balance Balance Overall balance assessment: Needs assistance Sitting-balance support: Feet supported,Bilateral upper extremity supported Sitting balance-Leahy Scale: Poor Sitting balance - Comments: UE support for balance Standing balance support: Bilateral upper  extremity supported Standing balance-Leahy Scale: Poor Standing balance comment: Static standing with Stedy or walker with +2 min assist. Stood x 2 with Stedy x 30-40 sec. Stood x 1 with walker    Special needs/care consideration Dialysis: Hemodialysis Monday, Wednesday and Friday, Diabetic management yes and Designated visitor [place] (from acute therapy documentation) Living Arrangements: Spouse/significant other Available Help at Discharge: Family,Available 24 hours/day Type of Home: [place]: One level,Other (Comment),Laundry or work area in [place] Access: Level entry (at front) Bathroom Shower/Tub: Chiropodist: [place]: No Additional Comments: spouse is healthy; son works for family business  Discharge Living Setting Plans for Discharge Living Setting: Patient's home Type of Home at Discharge: House Discharge [place]: One level,Laundry or work area in basement Discharge Home Access: Stairs to enter Entrance Stairs-Rails: None Entrance Stairs-Number of Steps: 1 step up through primary entry Discharge Bathroom Shower/Tub: Walk-in shower (per wife, has 25" step over) Discharge Bathroom Toilet: Standard Discharge Bathroom Accessibility: Yes How Accessible: Accessible via walker Does the patient have any problems obtaining your medications?: No  Social/Family/Support Systems Patient Roles: Spouse Contact Information: Jaaziah Schulke Anticipated Caregiver: spouse, Vicente Males Anticipated Ambulance person Information: [phone] Ability/Limitations of Caregiver: none, supportive family Caregiver Availability: 24/7 Discharge Plan Discussed with Primary Caregiver: Yes Is Caregiver In Agreement with Plan?: Yes Does Caregiver/Family have Issues with Lodging/Transportation while Pt is in Rehab?: No  Goals Patient/Family Goal for Rehab: PT/OT/SLP supervision to mod I Expected length of stay:  15-18 days Additional Information: 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  Pt with good family support and potential to reach supervision level or better.   Patient Condition: I have reviewed medical records from [facility], spoken with CSW, and spouse. I discussed via phone for inpatient rehabilitation assessment.  Patient will benefit from ongoing PT, OT and SLP, can actively participate in 3 hours of therapy a day 5 days of the week, and can make measurable gains during the admission.  Patient will also benefit from the coordinated team approach during an Inpatient Acute Rehabilitation admission.  The patient will receive intensive therapy as well as Rehabilitation physician, nursing, social worker, and care management interventions.  Due to bladder management,  bowel management, safety, skin/wound care, disease management, medication administration, pain management and patient education the patient requires 24 hour a day rehabilitation nursing.  The patient is currently mod +2 with mobility and basic ADLs.  Discharge setting and therapy post discharge at home with home health is anticipated.  Patient has agreed to participate in the Acute Inpatient Rehabilitation Program and will admit today.  Preadmission Screen Completed By:  Michel Santee, PT, DPT 11/10/2020 10:48 AM ______________________________________________________________________   Discussed status with Dr. Posey Pronto on 11/10/20  at 11:09 AM and received approval for admission today.  Admission Coordinator:  Michel Santee, PT, DPT, Time 11:09 AM Sudie Grumbling 11/10/20    Assessment/Plan: Diagnosis: debility  1. Does the need for close, 24 hr/day Medical  supervision in concert with the patient's rehab needs make it unreasonable for this patient to be served in a less intensive setting? Yes  2. Co-Morbidities requiring supervision/potential complications: afib, on chronic anticoagulation, systolic CHF (Monitor in accordance with increased physical activity and avoid UE resistance excercises), melanoma, a vitamin B12 deficiency, 3. Due to safety, disease management and patient education, does the patient require 24 hr/day rehab nursing? Yes 4. Does the patient require coordinated care of a physician, rehab nurse, PT, OT to address physical and functional deficits in the context of the above medical diagnosis(es)? Yes Addressing deficits in the following areas: balance, endurance, locomotion, strength, transferring, bathing, dressing, toileting and psychosocial support 5. Can the patient actively participate in an intensive therapy program of at least 3 hrs of therapy 5 days a week? Yes 6. The potential for patient to make measurable gains while on inpatient rehab is excellent 7. Anticipated functional outcomes upon discharge from inpatient rehab: supervision and min assist PT, supervision and min assist OT, n/a SLP 8. Estimated rehab length of stay to reach the above functional goals is: 12-16 days. 9. Anticipated discharge destination: Home 10. Overall Rehab/Functional Prognosis: good   MD Signature: Delice Lesch, MD, ABPMR          Revision History                        Note Details  Author Jamse Arn, MD File Time 11/10/2020 11:21 AM  Author Type Physician Status Signed  Last Editor Jamse Arn, MD Service Physical Medicine and Rehabilitation

## 2020-11-10 NOTE — Plan of Care (Signed)
Problem: Education: Goal: Knowledge of disease or condition will improve 11/10/2020 1101 by Camillia Herter, RN Outcome: Adequate for Discharge 11/10/2020 [phone] by Camillia Herter, RN Outcome: Progressing Goal: Knowledge of secondary prevention will improve 11/10/2020 1101 by Camillia Herter, RN Outcome: Adequate for Discharge 11/10/2020 [phone] by Camillia Herter, RN Outcome: Progressing Goal: Knowledge of patient specific risk factors addressed and post discharge goals established will improve 11/10/2020 1101 by Camillia Herter, RN Outcome: Adequate for Discharge 11/10/2020 0814 by Camillia Herter, RN Outcome: Progressing   Problem: Coping: Goal: Will verbalize positive feelings about self 11/10/2020 1101 by Camillia Herter, RN Outcome: Adequate for Discharge 11/10/2020 [phone] by Camillia Herter, RN Outcome: Progressing Goal: Will identify appropriate support needs 11/10/2020 1101 by Camillia Herter, RN Outcome: Adequate for Discharge 11/10/2020 [phone] by Camillia Herter, RN Outcome: Progressing   Problem: Health Behavior/Discharge Planning: Goal: Ability to manage health-related needs will improve 11/10/2020 1101 by Camillia Herter, RN Outcome: Adequate for Discharge 11/10/2020 [phone] by Camillia Herter, RN Outcome: Progressing   Problem: Education: Goal: Knowledge of General Education information will improve Description: Including pain rating scale, medication(s)/side effects and non-pharmacologic comfort measures 11/10/2020 1101 by Camillia Herter, RN Outcome: Adequate for Discharge 11/10/2020 0814 by Camillia Herter, RN Outcome: Progressing   Problem: Health Behavior/Discharge Planning: Goal: Ability to manage health-related needs will improve 11/10/2020 1101 by Camillia Herter, RN Outcome: Adequate for Discharge 11/10/2020 [phone] by Camillia Herter, RN Outcome: Progressing   Problem: Clinical Measurements: Goal: Ability to maintain clinical measurements within normal limits will improve 11/10/2020  1101 by Camillia Herter, RN Outcome: Adequate for Discharge 11/10/2020 [phone] by Camillia Herter, RN Outcome: Progressing Goal: Will remain free from infection 11/10/2020 1101 by Camillia Herter, RN Outcome: Adequate for Discharge 11/10/2020 [phone] by Camillia Herter, RN Outcome: Progressing Goal: Diagnostic test results will improve 11/10/2020 1101 by Camillia Herter, RN Outcome: Adequate for Discharge 11/10/2020 [phone] by Camillia Herter, RN Outcome: Progressing Goal: Respiratory complications will improve 11/10/2020 1101 by Camillia Herter, RN Outcome: Adequate for Discharge 11/10/2020 [phone] by Camillia Herter, RN Outcome: Progressing Goal: Cardiovascular complication will be avoided 11/10/2020 1101 by Camillia Herter, RN Outcome: Adequate for Discharge 11/10/2020 [phone] by Camillia Herter, RN Outcome: Progressing   Problem: Activity: Goal: Risk for activity intolerance will decrease 11/10/2020 1101 by Camillia Herter, RN Outcome: Adequate for Discharge 11/10/2020 [phone] by Camillia Herter, RN Outcome: Progressing   Problem: Nutrition: Goal: Adequate nutrition will be maintained 11/10/2020 1101 by Camillia Herter, RN Outcome: Adequate for Discharge 11/10/2020 [phone] by Camillia Herter, RN Outcome: Progressing   Problem: Coping: Goal: Level of anxiety will decrease 11/10/2020 1101 by Camillia Herter, RN Outcome: Adequate for Discharge 11/10/2020 [phone] by Camillia Herter, RN Outcome: Progressing   Problem: Elimination: Goal: Will not experience complications related to bowel motility 11/10/2020 1101 by Camillia Herter, RN Outcome: Adequate for Discharge 11/10/2020 [phone] by Camillia Herter, RN Outcome: Progressing   Problem: Pain Managment: Goal: General experience of comfort will improve 11/10/2020 1101 by Camillia Herter, RN Outcome: Adequate for Discharge 11/10/[phone] by Camillia Herter, RN Outcome: Progressing   Problem: Safety: Goal: Ability to remain free from injury will improve 11/10/2020 1101 by  Camillia Herter, RN Outcome: Adequate for Discharge 11/10/2020 [phone] by Camillia Herter, RN Outcome: Progressing   Problem: Skin Integrity: Goal: Risk for impaired skin  integrity will decrease 11/10/2020 1101 by Camillia Herter, RN Outcome: Adequate for Discharge 11/10/2020 [phone] by Camillia Herter, RN Outcome: Progressing   Problem: Education: Goal: Knowledge of risk factors and measures for prevention of condition will improve 11/10/2020 1101 by Camillia Herter, RN Outcome: Adequate for Discharge 11/10/2020 [phone] by Camillia Herter, RN Outcome: Progressing   Problem: Coping: Goal: Psychosocial and spiritual needs will be supported 11/10/2020 1101 by Camillia Herter, RN Outcome: Adequate for Discharge 11/10/2020 [phone] by Camillia Herter, RN Outcome: Progressing   Problem: Respiratory: Goal: Will maintain a patent airway 11/10/2020 1101 by Camillia Herter, RN Outcome: Adequate for Discharge 11/10/2020 [phone] by Camillia Herter, RN Outcome: Progressing Goal: Complications related to the disease process, condition or treatment will be avoided or minimized 11/10/2020 1101 by Camillia Herter, RN Outcome: Adequate for Discharge 11/10/2020 0814 by Camillia Herter, RN Outcome: Progressing   Problem: Education: Goal: Knowledge of secondary prevention will improve 11/10/2020 1101 by Camillia Herter, RN Outcome: Adequate for Discharge 11/10/2020 [phone] by Camillia Herter, RN Outcome: Progressing   Problem: Education: Goal: Knowledge of disease and its progression will improve 11/10/2020 1101 by Camillia Herter, RN Outcome: Adequate for Discharge 11/10/2020 [phone] by Camillia Herter, RN Outcome: Progressing   Problem: Health Behavior/Discharge Planning: Goal: Ability to manage health-related needs will improve 11/10/2020 1101 by Camillia Herter, RN Outcome: Adequate for Discharge 11/10/2020 [phone] by Camillia Herter, RN Outcome: Progressing   Problem: Clinical Measurements: Goal: Complications related to the  disease process or treatment will be avoided or minimized 11/10/2020 1101 by Camillia Herter, RN Outcome: Adequate for Discharge 11/10/[phone] by Camillia Herter, RN Outcome: Progressing Goal: Dialysis access will remain free of complications 1/77/[phone] by Camillia Herter, RN Outcome: Adequate for Discharge 11/10/2020 0814 by Camillia Herter, RN Outcome: Progressing   Problem: Activity: Goal: Activity intolerance will improve 11/10/2020 1101 by Camillia Herter, RN Outcome: Adequate for Discharge 11/10/2020 [phone] by Camillia Herter, RN Outcome: Progressing   Problem: Fluid Volume: Goal: Fluid volume balance will be maintained or improved 11/10/2020 1101 by Camillia Herter, RN Outcome: Adequate for Discharge 11/10/2020 [phone] by Camillia Herter, RN Outcome: Progressing   Problem: Nutritional: Goal: Ability to make appropriate dietary choices will improve 11/10/2020 1101 by Camillia Herter, RN Outcome: Adequate for Discharge 11/10/[phone] by Camillia Herter, RN Outcome: Progressing   Problem: Respiratory: Goal: Respiratory symptoms related to disease process will be avoided 11/10/2020 1101 by Camillia Herter, RN Outcome: Adequate for Discharge 11/10/2020 [phone] by Camillia Herter, RN Outcome: Progressing   Problem: Self-Concept: Goal: Body image disturbance will be avoided or minimized 11/10/2020 1101 by Camillia Herter, RN Outcome: Adequate for Discharge 11/10/2020 [phone] by Camillia Herter, RN Outcome: Progressing   Problem: Urinary Elimination: Goal: Progression of disease will be identified and treated 11/10/2020 1101 by Camillia Herter, RN Outcome: Adequate for Discharge 11/10/2020 [phone] by Camillia Herter, RN Outcome: Progressing

## 2020-11-10 NOTE — Progress Notes (Signed)
Inpatient Rehab Admissions:  Inpatient Rehab Consult received.  I spoke with pt's wife over the phone for rehabilitation assessment and to discuss goals and expectations of an inpatient rehab admission.  They are very hopeful for CIR admission.  Pt will have 24/7 supervision available at discharge.  We discussed estimated length of stay to be about 2 weeks, based on progress, and goals of supervision to mod I.  I have a bed available for pt to admit to CIR today and Dr. Cruzita Lederer in agreement. Will let pt/family and TOC team know.   Signed: Shann Medal, PT, DPT Admissions Coordinator [phone] 11/10/20  10:37 AM

## 2020-11-10 NOTE — H&P (Addendum)
Physical Medicine and Rehabilitation Admission H&P    Chief Complaint  Patient presents with  . Debility    HPI: Raymond Hurley is an 87 year old male with history of melanoma, vitamin B12 deficiency, A. fib--on Eliquis who was admitted on 10/14/2020 with acute right facial droop, dysarthria, and difficulty following commands.  History taken from chart review and patient.  Family reported patient with recent diagnosis of Covid with diarrhea and poor p.o. intake.  MRI/MRA head was unremarkable for acute intracranial process.  He was found to have leukocytosis with elevated BNP as well as acute kidney injury and elevated D-dimer.  He was treated with fluid protocol and IV antibiotics for septic shock. CT abdomen 01/07 showed multifocal pneumonia as well as severe inflammatory changes around pancreas consistent with severe pancreatitis as well as 4.6 x 2.3 cm low-density in right iliac is muscle concerning for developing abscess.  Dr. Alessandra Bevels consulted for input and pancreatitis treated with supportive care as well as course of meropenem and linezolid.  Acute renal failure felt to be due to to ATN with hypovolemia and hypotension.  He required CVVHD and has been transitioned to hemodialysis.  Acute hypoxic respiratory failure due to aspiration pneumonia treated with stress dose steroids with improvement with recommendations for slow prednisone taper.  Amiodarone was added for rate control of atrial fibrillation with RVR per cardiology input.  He has had issues with GIB due to hematochezia and required multiple transfusions and anticoagulation was discontinued.  Thrombocytopenia being monitored.  He completed antibiotic course on 10/31/2020 and follow-up CT abdomen 11/06/2020 showed severe acute pancreatitis with probable early pseudocyst formation as well as increased thickness in the right psoas muscle and no change in right iliac [place] muscle.  Current recommendations are to monitor off antibiotics.  Encephalopathy improving, he is tolerating regular diet and currently noted to be significantly debilitated with generalized weakness affecting ability to stand or complete ADL tasks. CIR recommended due to functional decline.  Please see preadmission assessment from earlier today as well.  Review of Systems  Constitutional: Positive for malaise/fatigue. Negative for chills and fever.  HENT: Negative for hearing loss and tinnitus.   Eyes: Negative for blurred vision and double vision.  Respiratory: Negative for cough and shortness of breath.   Cardiovascular: Positive for leg swelling. Negative for chest pain and palpitations.  Gastrointestinal: Negative for constipation, diarrhea, heartburn and nausea.  Genitourinary: Negative for dysuria and urgency.  Musculoskeletal: Negative for myalgias.  Skin: Negative for itching and rash.  Neurological: Positive for weakness.  Psychiatric/Behavioral: The patient is not nervous/anxious and does not have insomnia.   All other systems reviewed and are negative.     Past Medical History:  Diagnosis Date  . Atrial fibrillation ([place])   . CAD (coronary artery disease) 04/2019  . CHF (congestive heart failure) ([place])   . Melanoma ([place])   . Vitamin B 12 deficiency    Past Surgical History:  Procedure Laterality Date  . CARDIOVERSION N/A 06/02/2019   Procedure: CARDIOVERSION;  Surgeon: Jerline Pain, MD;  Location: [facility] ENDOSCOPY;  Service: Cardiovascular;  Laterality: N/A;  . IR FLUORO GUIDE CV LINE RIGHT  11/06/2020  . IR US GUIDE VASC ACCESS RIGHT  11/06/2020  . RIGHT/LEFT HEART CATH AND CORONARY ANGIOGRAPHY N/A 04/27/2019   Procedure: RIGHT/LEFT HEART CATH AND CORONARY ANGIOGRAPHY;  Surgeon: Jolaine Artist, MD;  Location: [facility] CV LAB;  Service: Cardiovascular;  Laterality: N/A;  . TEE WITHOUT CARDIOVERSION N/A 06/02/2019  Procedure: TRANSESOPHAGEAL ECHOCARDIOGRAM (TEE);  Surgeon: Jerline Pain, MD;  Location: [facility] ENDOSCOPY;  Service:  Cardiovascular;  Laterality: N/A;    Family History  Problem Relation Age of Onset  . Melanoma Daughter      Social History:  reports that he has never smoked. He has never used smokeless tobacco. He reports current alcohol use of about 7.0 standard drinks of alcohol per week. He reports that he does not use drugs.   Allergies  Allergen Reactions  . Povidone-Iodine Swelling  . Tape Swelling  . Bee Venom Rash  . Covid-19 Mrna Vacc (Moderna) Hives and Rash    Rash came after booster shot. Pt was fine with 1st and 2nd    Medications Prior to Admission  Medication Sig Dispense Refill  . acetaminophen (TYLENOL) 500 MG tablet Take 1,000 mg by mouth every 6 (six) hours as needed for mild pain or headache.    Marland Kitchen ELIQUIS 5 MG TABS tablet TAKE ONE TABLET TWICE DAILY (Patient taking differently: Take 5 mg by mouth 2 (two) times daily.) 180 tablet 1  . metoprolol succinate (TOPROL-XL) 25 MG 24 hr tablet Take 1 tablet (25 mg total) by mouth every evening. 90 tablet 3  . sacubitril-valsartan (ENTRESTO) 24-26 MG Take 1 tablet by mouth 2 (two) times daily. 180 tablet 3  . triamcinolone cream (KENALOG) 0.1 % Apply 1 application topically 2 (two) times daily as needed (itching, rash).    . montelukast (SINGULAIR) 10 MG tablet Take 1 tablet by mouth once daily as directed (Patient not taking: No sig reported) 30 tablet 5    Drug Regimen Review  Drug regimen was reviewed and remains appropriate with no significant issues identified  Home: Home Living Family/patient expects to be discharged to:: Private residence Living Arrangements: Spouse/significant other Available Help at Discharge: Family,Available 24 hours/day Type of Home: House Home Access: Level entry (at front) Home Layout: One level,Other (Comment),Laundry or work area in basement [employer] Shower/Tub: Chiropodist: Standard Home Equipment: Shower seat,Grab bars - tub/shower Additional Comments: spouse is healthy;  son works for family business   Functional History: Prior Function Level of Independence: Independent with assistive device(s),Independent Comments: driving; work full-time; Careers adviser; no AD  Functional Status:  Mobility: Bed Mobility Overal bed mobility: Needs Assistance Bed Mobility: Sidelying to Sit Rolling: Mod assist Sidelying to sit: Mod assist,HOB elevated Supine to sit: Max assist,+2 for safety/equipment Sit to supine: Mod assist Sit to sidelying: +2 for physical assistance,Mod assist General bed mobility comments: Assist to bring legs off of bed, elevate trunk into sitting. Transfers Overall transfer level: Needs assistance Equipment used: Ambulation equipment used,Rolling walker (2 wheeled) Transfer via Lift Equipment: Stedy Transfers: Sit to/from Stand Sit to Stand: +2 physical assistance,Mod assist,Min assist Stand pivot transfers: Mod assist,+2 safety/equipment General transfer comment: +2 min assist to stand with Stedy from elevate surface. Use of bed pad to bring hips up. +2 mod assist to bring hips up from low recliner and using walker. Bed to chair with Stedy Ambulation/Gait Ambulation/Gait assistance: +2 physical assistance,Mod assist Gait Distance (Feet): 1 Feet Assistive device: Rolling walker (2 wheeled) Gait Pattern/deviations: Step-to pattern,Decreased step length - right,Decreased step length - left,Leaning posteriorly (knee hyperextension) General Gait Details: Assist for balance and support. Gait velocity: decr Gait velocity interpretation: <1.31 ft/sec, indicative of household ambulator    ADL: ADL Overall ADL's : Needs assistance/impaired Eating/Feeding: Set up,Sitting Grooming: Wash/dry hands,Wash/dry face,Set up,Sitting Grooming Details (indicate cue type and reason): EOB Upper Body Bathing:  Moderate assistance,Sitting Lower Body Bathing: Moderate assistance,Sitting/lateral leans Lower Body Bathing Details (indicate cue type and  reason): assist for back Upper Body Dressing : Minimal assistance,Sitting Lower Body Dressing: Total assistance,Bed level Lower Body Dressing Details (indicate cue type and reason): for socks Toilet Transfer: Moderate assistance,+2 for physical assistance,+2 for safety/equipment,BSC Toilet Transfer Details (indicate cue type and reason): initially light mod A +2 and with each sit<>stand increased assist required Toileting- Clothing Manipulation and Hygiene: Maximal assistance,+2 for physical assistance,+2 for safety/equipment,Sit to/from stand Toileting - Clothing Manipulation Details (indicate cue type and reason): pt on bed pan upon arrival, totalA for pericare after Functional mobility during ADLs: Moderate assistance,+2 for physical assistance,+2 for safety/equipment (stedy) General ADL Comments: greatly improving and very motivated to return to PLOF  Cognition: Cognition Overall Cognitive Status: Within Functional Limits for tasks assessed Orientation Level: Oriented X4 Cognition Arousal/Alertness: Awake/alert Behavior During Therapy: WFL for tasks assessed/performed Overall Cognitive Status: Within Functional Limits for tasks assessed Area of Impairment: Problem solving,Attention,Awareness Orientation Level: Time Current Attention Level: Selective Memory: Decreased short-term memory Safety/Judgement: Decreased awareness of deficits Awareness: Emergent Problem Solving: Slow processing,Requires verbal cues General Comments: multimodal cues for initiating/sequencing mobility tasks today Difficult to assess due to: Hard of hearing/deaf   Blood pressure (!) 115/55, pulse 78, temperature 98.3 F (36.8 C), temperature source Axillary, resp. rate 18, height 5\' 8"  (1.727 m), weight 90.1 kg, SpO2 95 %. Physical Exam Vitals and nursing note reviewed.  Constitutional:      General: He is not in acute distress.    Appearance: He is not ill-appearing.  HENT:     Head: Normocephalic and  atraumatic.     Right Ear: External ear normal.     Left Ear: External ear normal.     Nose: Nose normal.  Eyes:     General:        Right eye: No discharge.        Left eye: No discharge.     Extraocular Movements: Extraocular movements intact.  Cardiovascular:     Rate and Rhythm: Normal rate and regular rhythm.  Pulmonary:     Effort: Pulmonary effort is normal. No respiratory distress.     Breath sounds: No stridor.  Abdominal:     General: Bowel sounds are normal. There is distension.     Tenderness: There is no abdominal tenderness.  Musculoskeletal:        General: Swelling (2+, right lower extremity greater than left lower extremity) present.     Cervical back: Normal range of motion and neck supple.  Skin:    General: Skin is warm and dry.  Neurological:     Mental Status: He is alert and oriented to person, place, and time.     Comments: Alert Motor: Bilateral upper extremity: 4/5 proximal distal Bilateral lower extremities: Hip flexion, knee extension 2+/5, ankle dorsiflexion 4/5  Psychiatric:        Mood and Affect: Mood normal.        Behavior: Behavior normal.     Results for orders placed or performed during the hospital encounter of 10/14/20 (from the past 48 hour(s))  Glucose, capillary     Status: Abnormal   Collection Time: 11/08/20  4:10 PM  Result Value Ref Range   Glucose-Capillary 136 (H) 70 - 99 mg/dL    Comment: Glucose reference range applies only to samples taken after fasting for at least 8 hours.  Glucose, capillary     Status: Abnormal   Collection Time:  11/08/20  9:17 PM  Result Value Ref Range   Glucose-Capillary 161 (H) 70 - 99 mg/dL    Comment: Glucose reference range applies only to samples taken after fasting for at least 8 hours.  Magnesium     Status: None   Collection Time: 11/09/20  3:44 AM  Result Value Ref Range   Magnesium 2.2 1.7 - 2.4 mg/dL    Comment: Performed at [facility] [address] [place], [place]  [address]  Renal function panel     Status: Abnormal   Collection Time: 11/09/20  3:44 AM  Result Value Ref Range   Sodium 136 135 - 145 mmol/L   Potassium 4.9 3.5 - 5.1 mmol/L   Chloride 100 98 - 111 mmol/L   CO2 16 (L) 22 - 32 mmol/L   Glucose, Bld 74 70 - 99 mg/dL    Comment: Glucose reference range applies only to samples taken after fasting for at least 8 hours.   BUN 109 (H) 8 - 23 mg/dL   Creatinine, Ser 7.50 (H) 0.61 - 1.24 mg/dL   Calcium 8.3 (L) 8.9 - 10.3 mg/dL   Phosphorus 9.4 (H) 2.5 - 4.6 mg/dL   Albumin 2.0 (L) 3.5 - 5.0 g/dL   GFR, Estimated 6 (L) >60 mL/min    Comment: (NOTE) Calculated using the CKD-EPI Creatinine Equation (2021)    Anion gap 20 (H) 5 - 15    Comment: Performed at [facility] [address] [place], [place] [address]  Parathyroid hormone, intact (no Ca)     Status: Abnormal   Collection Time: 11/09/20  3:44 AM  Result Value Ref Range   PTH 217 (H) 15 - 65 pg/mL    Comment: (NOTE) Performed At: [facility] [place], [place] [id number] Rush Farmer MD YP:[id number]   CBC with Differential/Platelet     Status: Abnormal   Collection Time: 11/09/20  3:44 AM  Result Value Ref Range   WBC 38.6 (H) 4.0 - 10.5 K/uL   RBC 2.59 (L) 4.22 - 5.81 MIL/uL   Hemoglobin 7.6 (L) 13.0 - 17.0 g/dL   HCT 24.1 (L) 39.0 - 52.0 %   MCV 93.1 80.0 - 100.0 fL   MCH 29.3 26.0 - 34.0 pg   MCHC 31.5 30.0 - 36.0 g/dL   RDW 22.2 (H) 11.5 - 15.5 %   Platelets 100 (L) 150 - 400 K/uL    Comment: REPEATED TO VERIFY Immature Platelet Fraction may be clinically indicated, consider ordering this additional test [id number] CONSISTENT WITH PREVIOUS RESULT    nRBC 0.1 0.0 - 0.2 %   Neutrophils Relative % 71 %   Neutro Abs 27.0 (H) 1.7 - 7.7 K/uL   Lymphocytes Relative 1 %   Lymphs Abs 0.5 (L) 0.7 - 4.0 K/uL   Monocytes Relative 12 %   Monocytes Absolute 4.8 (H) 0.1 - 1.0 K/uL   Eosinophils Relative 0 %   Eosinophils Absolute 0.0 0.0 - 0.5  K/uL   Basophils Relative 0 %   Basophils Absolute 0.1 0.0 - 0.1 K/uL   WBC Morphology      MODERATE LEFT SHIFT (>5% METAS AND MYELOS,OCC PRO NOTED)   Immature Granulocytes 16 %   Abs Immature Granulocytes 6.23 (H) 0.00 - 0.07 K/uL   Burr Cells PRESENT     Comment: Performed at [facility], [address], [place], [place] [address]  Pathologist smear review     Status: None  Collection Time: 11/09/20  3:44 AM  Result Value Ref Range   Path Review Leukocytosis with left shift     Comment: Normocytic anemia Thrombocytopenia Reviewed by Charolett Bumpers. Jeannie Done, M.D. 11/09/2020 Performed at [facility], [place] [address] [place], [place] [address]   Glucose, capillary     Status: None   Collection Time: 11/09/20  4:36 AM  Result Value Ref Range   Glucose-Capillary 75 70 - 99 mg/dL    Comment: Glucose reference range applies only to samples taken after fasting for at least 8 hours.  Glucose, capillary     Status: None   Collection Time: 11/09/20  7:41 AM  Result Value Ref Range   Glucose-Capillary 79 70 - 99 mg/dL    Comment: Glucose reference range applies only to samples taken after fasting for at least 8 hours.  Glucose, capillary     Status: None   Collection Time: 11/09/20 12:05 PM  Result Value Ref Range   Glucose-Capillary 94 70 - 99 mg/dL    Comment: Glucose reference range applies only to samples taken after fasting for at least 8 hours.  Glucose, capillary     Status: Abnormal   Collection Time: 11/09/20  4:44 PM  Result Value Ref Range   Glucose-Capillary 154 (H) 70 - 99 mg/dL    Comment: Glucose reference range applies only to samples taken after fasting for at least 8 hours.  Glucose, capillary     Status: Abnormal   Collection Time: 11/09/20  9:11 PM  Result Value Ref Range   Glucose-Capillary 167 (H) 70 - 99 mg/dL    Comment: Glucose reference range applies only to samples taken after fasting for at least 8 hours.  Glucose, capillary     Status:  Abnormal   Collection Time: 11/09/20 11:37 PM  Result Value Ref Range   Glucose-Capillary 113 (H) 70 - 99 mg/dL    Comment: Glucose reference range applies only to samples taken after fasting for at least 8 hours.  Magnesium     Status: None   Collection Time: 11/10/20  4:15 AM  Result Value Ref Range   Magnesium 2.1 1.7 - 2.4 mg/dL    Comment: Performed at [facility] [address] [place], [place] [address]  Glucose, capillary     Status: None   Collection Time: 11/10/20  4:15 AM  Result Value Ref Range   Glucose-Capillary 74 70 - 99 mg/dL    Comment: Glucose reference range applies only to samples taken after fasting for at least 8 hours.  Glucose, capillary     Status: None   Collection Time: 11/10/20  7:37 AM  Result Value Ref Range   Glucose-Capillary 90 70 - 99 mg/dL    Comment: Glucose reference range applies only to samples taken after fasting for at least 8 hours.  Glucose, capillary     Status: Abnormal   Collection Time: 11/10/20 11:18 AM  Result Value Ref Range   Glucose-Capillary 121 (H) 70 - 99 mg/dL    Comment: Glucose reference range applies only to samples taken after fasting for at least 8 hours.   No results found.     Medical Problem List and Plan: 1.  Generalized weakness affecting ability to stand or complete ADL tasks secondary to debility.  -patient may shower as tolerated  -ELOS/Goals: 18-22 days/Min A/supervision  Admit to CIR 2.  Antithrombotics: -DVT/anticoagulation:  Mechanical: Sequential compression devices, below knee Bilateral lower extremities due to GI bleed.  -  antiplatelet therapy: N/A 3. Pain Management: Tylenol prn.  4. Mood: LCSW to follow for evaluation and support.   -antipsychotic agents: N/a 5. Neuropsych: This patient is capable of making decisions on his own behalf. 6. Skin/Wound Care: Routine pressure relief measures.  7. Fluids/Electrolytes/Nutrition: Strict I/O.  Labs with HD. 8.  ESRD: On midodrine prior to HD to  help support BP.  9. Chronic systolic CHF/CAF: Strict I/Os. Fluid status managed with HD. On Amiodarone 400 mg bid for rate control.   Daily weights  Monitor heart rate with increased activity 10.  Pancreatitis: Has completed antibiotic course. Tolerating diet without nausea, diarrhea or abdominal pain.   Monitor closely. 11. LGIB: On Carafate ac/hs.  12. Acute on chronic anemia: On aranresp weekly.   Labs with HD 13. Thrombocytopenia: Monitor for signs of bleeding.    Labs with HD 14. Acute respiratory failure: Has resolved. prednisone 40 mg daily   Bary Leriche, PA-C 11/10/2020  I have personally performed a face to face diagnostic evaluation, including, but not limited to relevant history and physical exam findings, of this patient and developed relevant assessment and plan.  Additionally, I have reviewed and concur with the physician assistant's documentation above.  Delice Lesch, MD, ABPMR  The patient's status has not changed. Any changes from the pre-admission screening or documentation from the acute chart are noted above.   Delice Lesch, MD, ABPMR

## 2020-11-11 DIAGNOSIS — R5381 Other malaise: Secondary | ICD-10-CM | POA: Diagnosis not present

## 2020-11-11 LAB — GLUCOSE, CAPILLARY
Glucose-Capillary: 109 mg/dL — ABNORMAL HIGH (ref 70–99)
Glucose-Capillary: 128 mg/dL — ABNORMAL HIGH (ref 70–99)
Glucose-Capillary: 144 mg/dL — ABNORMAL HIGH (ref 70–99)
Glucose-Capillary: 84 mg/dL (ref 70–99)
Glucose-Capillary: 87 mg/dL (ref 70–99)
Glucose-Capillary: 99 mg/dL (ref 70–99)

## 2020-11-11 LAB — COMPREHENSIVE METABOLIC PANEL
ALT: 7 U/L (ref 0–44)
AST: 25 U/L (ref 15–41)
Albumin: 2.3 g/dL — ABNORMAL LOW (ref 3.5–5.0)
Alkaline Phosphatase: 105 U/L (ref 38–126)
Anion gap: 18 — ABNORMAL HIGH (ref 5–15)
BUN: 103 mg/dL — ABNORMAL HIGH (ref 8–23)
CO2: 18 mmol/L — ABNORMAL LOW (ref 22–32)
Calcium: 8.4 mg/dL — ABNORMAL LOW (ref 8.9–10.3)
Chloride: 102 mmol/L (ref 98–111)
Creatinine, Ser: 7.35 mg/dL — ABNORMAL HIGH (ref 0.61–1.24)
GFR, Estimated: 7 mL/min — ABNORMAL LOW (ref >60)
Glucose, Bld: 97 mg/dL (ref 70–99)
Potassium: 4.3 mmol/L (ref 3.5–5.1)
Sodium: 138 mmol/L (ref 135–145)
Total Bilirubin: 0.7 mg/dL (ref 0.3–1.2)
Total Protein: 4.8 g/dL — ABNORMAL LOW (ref 6.5–8.1)

## 2020-11-11 LAB — CBC
HCT: 21.2 % — ABNORMAL LOW (ref 39.0–52.0)
Hemoglobin: 7.3 g/dL — ABNORMAL LOW (ref 13.0–17.0)
MCH: 30.5 pg (ref 26.0–34.0)
MCHC: 34.4 g/dL (ref 30.0–36.0)
MCV: 88.7 fL (ref 80.0–100.0)
Platelets: 123 10*3/uL — ABNORMAL LOW (ref 150–400)
RBC: 2.39 MIL/uL — ABNORMAL LOW (ref 4.22–5.81)
RDW: 22 % — ABNORMAL HIGH (ref 11.5–15.5)
WBC: 32.1 10*3/uL — ABNORMAL HIGH (ref 4.0–10.5)
nRBC: 0.2 % (ref 0.0–0.2)

## 2020-11-11 LAB — PHOSPHORUS: Phosphorus: 7.9 mg/dL — ABNORMAL HIGH (ref 2.5–4.6)

## 2020-11-11 MED ORDER — ALTEPLASE 2 MG IJ SOLR: 2.0000 mg | Freq: Once | INTRAMUSCULAR | Status: DC | PRN

## 2020-11-11 MED ORDER — HEPARIN SODIUM (PORCINE) 1000 UNIT/ML DIALYSIS: 1000.0000 [IU] | INTRAMUSCULAR | Status: DC | PRN

## 2020-11-11 MED ORDER — SODIUM CHLORIDE 0.9 % IV SOLN: 100.0000 mL | INTRAVENOUS | Status: DC | PRN

## 2020-11-11 MED ORDER — LIDOCAINE-PRILOCAINE 2.5-2.5 % EX CREA
1.0000 "application " | TOPICAL_CREAM | CUTANEOUS | Status: DC | PRN
  Filled 2020-11-11: qty 5

## 2020-11-11 MED ORDER — MIDODRINE HCL 5 MG PO TABS
10.0000 mg | ORAL_TABLET | Freq: Once | ORAL | Status: AC
  Administered 2020-11-11: 10 mg via ORAL
  Filled 2020-11-11: qty 2

## 2020-11-11 MED ORDER — HEPARIN SODIUM (PORCINE) 1000 UNIT/ML IJ SOLN
INTRAMUSCULAR | Status: AC
  Administered 2020-11-11: 1000 [IU] via INTRAVENOUS_CENTRAL
  Filled 2020-11-11: qty 4

## 2020-11-11 MED ORDER — PENTAFLUOROPROP-TETRAFLUOROETH EX AERO: 1.0000 "application " | INHALATION_SPRAY | CUTANEOUS | Status: DC | PRN

## 2020-11-11 MED ORDER — LIDOCAINE HCL (PF) 1 % IJ SOLN
5.0000 mL | INTRAMUSCULAR | Status: DC | PRN
  Filled 2020-11-11: qty 5

## 2020-11-11 NOTE — Progress Notes (Signed)
[facility] Progress Note     Assessment/ Plan:   Dialysis dependent AKI 2/2 ATN. 02/2020 Cr 1.2. CRRT 1/4-1/12. And now tol iHD Patient/family desire ongoing dialysis and full care IR  tunneled dialysis catheter, on 1/24 Began clip process as AKI, SW aware - going to [facility] MWF Tolerating HD better after addition of midodrine, last on 1/27 with UF 2.5L .  HD today.  Will try to maximize fluid removal today given persistent LE edema.  Midodrine prior.  Profile 2, low temp.  Acute pancreatitis: Noted on CT scan 10/16/20 and again on 1/7. 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   Acute hypoxemic RF: likely aspiration PNA +/- COVID pneumonitis, now on RA. S/p speroids.  Resolved and off isolation now.    Shock: high WBC count, w/u per primary team. Possible ilacus abscess on CT from 1/7. Off levophed 1/11. Completed courese of Mero, zyvox. Slowly improving but WBC remains 32k.       5Acute encephalopathy: multifactorial, likely COVID + acute illness. Improved.   6.  Chronic systolic CHF: EF 4/0/08 on TTE 60-65% with no WMA.   7.  Afib: hep off with GIB; amiodarone po   8. Acute anemia: GI losses + AKI/CKD. Received multiple blood transfusion.  He has a history of metastatic melanoma.  On 1/22, Dr. Carolin Sicks discussed with multiple family members via video call in the patient's room.  The family discussed with the patient's oncologist at [facility] who reportedly said no contraindication for erythropoietin to manage anemia. Being treated with IV iron and ESA .  Running in mid 7s, transfuse < 7.   9.  Thrombocytopenia:  Initially normal, into 50s now back to 120s.  No heparin with dialysis.   Subjective:   Moved to CIR. Remains anuric.  Feeling good today.  Seen at dialysis.    Objective:   BP (!) 112/57 (BP Location: Right Arm)   Pulse 76   Temp 97.6 F (36.4 C) (Oral)   Resp 20   Ht 5\' 10"  (1.778 m)    Wt 91.9 kg   SpO2 97%   BMI 29.07 kg/m   Intake/Output Summary (Last 24 hours) at 11/11/2020 1053 Last data filed at 11/11/2020 0700 Gross per 24 hour  Intake 580 ml  Output --  Net 580 ml   Weight change:   Physical Exam: GEN: Not in distress, comfortable on RA NECK: Supple, no thyromegaly LUNGS: Clear bilateral, no rhonchi CV: RRR, No M/R/G ABD: SNDNT +BS  EXT: 2+  extremity edema with some improvement over the week but still significant ACCESS: RIJ TDC site clean  Imaging: No results found.  Labs: BMET Recent Labs  Lab 11/05/20 0315 11/06/20 0027 11/07/20 0200 11/09/20 0344 11/11/20 0622  NA 138 137 133* 136 138  K 4.3 4.6 4.2 4.9 4.3  CL 101 101 97* 100 102  CO2 20* 17* 16* 16* 18*  GLUCOSE 60* 123* 117* 74 97  BUN 88* 110* 135* 109* 103*  CREATININE 6.07* 7.26* 8.52* 7.50* 7.35*  CALCIUM 7.8* 7.8* 7.8* 8.3* 8.4*  PHOS 9.8* 10.6*  --  9.4* 7.9*   CBC Recent Labs  Lab 11/07/20 0200 11/08/20 0223 11/09/20 0344 11/11/20 0622  WBC 41.4* 42.3* 38.6* 32.1*  NEUTROABS  --   --  27.0*  --   HGB 7.8* 7.4* 7.6* 7.3*  HCT 23.6* 22.0* 24.1* 21.2*  MCV 89.4 87.6 93.1 88.7  PLT 86*  92* 100* 123*    Medications:     acetaminophen (TYLENOL) oral liquid 160 mg/5 mL  650 mg Oral Q6H   amiodarone  400 mg Per Tube BID   B-complex with vitamin C  1 tablet Per Tube Daily   Chlorhexidine Gluconate Cloth  6 each Topical Q0600   feeding supplement (NEPRO CARB STEADY)  237 mL Oral TID WC   Gerhardt's butt cream   Topical TID   insulin aspart  3-9 Units Subcutaneous Q4H   lactobacillus acidophilus  2 tablet Oral TID   lidocaine  1 patch Transdermal Q24H   mouth rinse  15 mL Mouth Rinse BID   [START ON 11/13/2020] midodrine  10 mg Oral Q M,W,F-HD   predniSONE  40 mg Per Tube Q breakfast   sucralfate  1 g Oral TID WC & HS   triamcinolone 0.1 % cream : eucerin   Topical BID    Justin Mend, MD  11/11/2020, 10:53 AM

## 2020-11-11 NOTE — Evaluation (Signed)
Occupational Therapy Assessment and Plan  Patient Details  Name: Raymond Hurley. MRN: [id number] Date of Birth: [birth year]  OT Diagnosis: abnormal posture, acute pain, muscle weakness (generalized) and swelling of limb Rehab Potential:   ELOS: 2.5-3 weeks   Today's Date: 11/11/2020 OT Individual Time: 715-815 60 min  Hospital Problem: Principal Problem:   Debility   Past Medical History:  Past Medical History:  Diagnosis Date  . Atrial fibrillation ([place])   . CAD (coronary artery disease) 04/2019  . CHF (congestive heart failure) ([place])   . Melanoma ([place])   . Vitamin B 12 deficiency    Past Surgical History:  Past Surgical History:  Procedure Laterality Date  . CARDIOVERSION N/A 06/02/2019   Procedure: CARDIOVERSION;  Surgeon: Jerline Pain, MD;  Location: [facility] ENDOSCOPY;  Service: Cardiovascular;  Laterality: N/A;  . IR FLUORO GUIDE CV LINE RIGHT  11/06/2020  . IR US GUIDE VASC ACCESS RIGHT  11/06/2020  . RIGHT/LEFT HEART CATH AND CORONARY ANGIOGRAPHY N/A 04/27/2019   Procedure: RIGHT/LEFT HEART CATH AND CORONARY ANGIOGRAPHY;  Surgeon: Jolaine Artist, MD;  Location: [facility] LAB;  Service: Cardiovascular;  Laterality: N/A;  . TEE WITHOUT CARDIOVERSION N/A 06/02/2019   Procedure: TRANSESOPHAGEAL ECHOCARDIOGRAM (TEE);  Surgeon: Jerline Pain, MD;  Location: [facility] ENDOSCOPY;  Service: Cardiovascular;  Laterality: N/A;    Assessment & Plan Clinical Impression:Pt is an 87 y.o. male admitted 10/14/20 with hypotension, diarrhea, COVID-19. Course complicated by AMS, aspiration PNA, multiple organ failure, pancreatitis. New onset afib with RVR 1/4 and transfer to ICU. CRRT 1/4-1/12; intermittent HD initiated 1/15. PMH includes metastatic melanoma, afib, CHF.  Patient currently requires mod with basic self-care skills secondary to muscle weakness, decreased cardiorespiratoy endurance and decreased sitting balance, decreased standing balance, decreased postural control and  decreased balance strategies.  Prior to hospitalization, patient could complete BADL with independent .  Patient will benefit from skilled intervention to decrease level of assist with basic self-care skills prior to discharge home with care partner.  Anticipate patient will require 24 hour supervision and follow up home health.  OT - End of Session Activity Tolerance: Tolerates 10 - 20 min activity with multiple rests Endurance Deficit: Yes OT Assessment Rehab Potential (ACUTE ONLY): Good OT Barriers to Discharge: Decreased caregiver support;Wound Care OT Patient demonstrates impairments in the following area(s): Balance;Edema;Endurance;Motor;Nutrition;Pain;Safety;Skin Integrity OT Basic ADL's Functional Problem(s): Grooming;Bathing;Dressing;Toileting OT Transfers Functional Problem(s): Toilet;Tub/Shower OT Plan OT Intensity: Minimum of 1-2 x/day, 45 to 90 minutes OT Frequency: 5 out of 7 days OT Duration/Estimated Length of Stay: 2.5-3 weeks OT Treatment/Interventions: Balance/vestibular training;Discharge planning;Functional electrical stimulation;Pain management;Self Care/advanced ADL retraining;Therapeutic Activities;UE/LE Coordination activities;Visual/perceptual remediation/compensation;Therapeutic Exercise;Skin care/wound managment;Patient/family education;Functional mobility training;Disease mangement/prevention;Community reintegration;DME/adaptive equipment instruction;Neuromuscular re-education;Psychosocial support;Splinting/orthotics;UE/LE Strength taining/ROM;Wheelchair propulsion/positioning OT Self Feeding Anticipated Outcome(s): no goal OT Basic Self-Care Anticipated Outcome(s): S  OT Toileting Anticipated Outcome(s): S OT Bathroom Transfers Anticipated Outcome(s): S OT Recommendation Patient destination: Home Follow Up Recommendations: Home health OT Equipment Recommended: 3 in 1 bedside comode;Tub/shower seat;To be determined   OT Evaluation Precautions/Restrictions   Precautions Precautions: Fall;Other (comment) Precaution Comments: Bowel incontinence (apparently had flexiseal removed yesterday) Restrictions Weight Bearing Restrictions: No General Chart Reviewed: Yes Family/Caregiver Present: No Vital Signs Therapy Vitals Temp: 97.6 F (36.4 C) Temp Source: Oral Pulse Rate: 76 Resp: 20 BP: (!) 112/57 Patient Position (if appropriate): Lying Oxygen Therapy SpO2: 97 % O2 Device: Room Air Pain Pain Assessment Pain Score: 3  Pain Location: Buttocks Home Living/Prior Functioning Home Living Available Help at  Discharge: Family,Available 24 hours/day Type of Home: House Home Access: Level entry Home Layout: One level,Other (Comment),Laundry or work area in basement [employer] Shower/Tub: Chiropodist: Standard Additional Comments: spouse is healthy; son works for family business Prior Function Comments: driving; work full-time; Careers adviser; no AD Vision Baseline Vision/History: Wears glasses Wears Glasses: At all times Patient Visual Report: No change from baseline Vision Assessment?: No apparent visual deficits Perception  Perception: Within Functional Limits Praxis Praxis: Intact Cognition Overall Cognitive Status: Within Functional Limits for tasks assessed Arousal/Alertness: Awake/alert Orientation Level: Person;Place;Situation Person: Oriented Place: Oriented Situation: Oriented Year: 2022 Month: February Day of Week: Correct Memory: Appears intact Immediate Memory Recall: Sock;Blue;Bed Memory Recall Sock: Without Cue Memory Recall Blue: Without Cue Memory Recall Bed: Without Cue Sensation Sensation Light Touch: Appears Intact Coordination Gross Motor Movements are Fluid and Coordinated: No Fine Motor Movements are Fluid and Coordinated: No Motor  Motor Motor: Abnormal postural alignment and control  Trunk/Postural Assessment  Cervical Assessment Cervical Assessment:  (head  forward) Thoracic Assessment Thoracic Assessment:  (rounded shoudlers) Lumbar Assessment Lumbar Assessment:  (post pelvic tilt) Postural Control Postural Control: Deficits on evaluation (delayed/insufficient)  Balance Dynamic Sitting Balance Sitting balance - Comments: UE support for balance Extremity/Trunk Assessment RUE Assessment RUE Assessment: Exceptions to [facility] General Strength Comments: generalized weakenss LUE Assessment LUE Assessment: Exceptions to [facility] General Strength Comments: generalized weakenss  Care Tool Care Tool Self Care Eating   Eating Assist Level: Set up assist    Oral Care    Oral Care Assist Level: Minimal Assistance - Patient > 75% (back pain EOB)    Bathing   Body parts bathed by patient: Right arm;Left arm;Chest;Abdomen;Front perineal area;Right upper leg;Left upper leg;Face Body parts bathed by helper: Buttocks;Right lower leg;Left lower leg   Assist Level: Moderate Assistance - Patient 50 - 74%    Upper Body Dressing(including orthotics)   What is the patient wearing?: Pull over shirt   Assist Level: Minimal Assistance - Patient > 75%    Lower Body Dressing (excluding footwear)   What is the patient wearing?: Pants Assist for lower body dressing: Maximal Assistance - Patient 25 - 49%    Putting on/Taking off footwear   What is the patient wearing?: Non-skid slipper socks;Ted hose Assist for footwear: Dependent - Patient 0%       Care Tool Toileting Toileting activity   Assist for toileting: Maximal Assistance - Patient 25 - 49%     Care Tool Bed Mobility Roll left and right activity        Sit to lying activity        Lying to sitting edge of bed activity         Care Tool Transfers Sit to stand transfer        Chair/bed transfer         Toilet transfer   Assist Level: Dependent - Patient 0% (stedy)     Care Tool Cognition Expression of Ideas and Wants     Understanding Verbal and Non-Verbal Content      Memory/Recall Ability *first 3 days only      Refer to Care Plan for Long Term Goals  SHORT TERM GOAL WEEK 1 OT Short Term Goal 1 (Week 1): Pt iwll transfer to toilet with MIN A consistently OT Short Term Goal 2 (Week 1): Pt will thread BLE into pants wiht AE PRN OT Short Term Goal 3 (Week 1): PT will complete 1/3 steps of toileting OT Short  Term Goal 4 (Week 1): pt will stand to groom to demo improved activity tolernace  Recommendations for other services: None    Skilled Therapeutic Intervention 1:1. Pt received in bed agreeable to OT after edu re OT role/purspose, CIR, ELOS, POC and recovery process. Pt completes EOB ADL as written below. Pt requires MAX A for Sup>sit and MOD A for sit to stand from EOB with RW to side step to [facility]. Pt demo debility/pooractivity tolerance and back pain as limiting factors to ADL performance. Pt is very motivated to progress and regain funciton. Exited session with pt seated in bed, exit alarm on and call light in reach   ADL ADL Eating: Set up Where Assessed-Eating: Bed level Grooming: Supervision/safety Where Assessed-Grooming: Edge of bed Lower Body Bathing: Moderate assistance Where Assessed-Lower Body Bathing: Edge of bed Upper Body Dressing: Minimal assistance Where Assessed-Upper Body Dressing: Edge of bed Lower Body Dressing: Maximal assistance Where Assessed-Lower Body Dressing: Edge of bed Mobility  Transfers Sit to Stand: Moderate Assistance - Patient 50-74% Stand to Sit: Moderate Assistance - Patient 50-74%   Discharge Criteria: Patient will be discharged from OT if patient refuses treatment 3 consecutive times without medical reason, if treatment goals not met, if there is a change in medical status, if patient makes no progress towards goals or if patient is discharged from hospital.  The above assessment, treatment plan, treatment alternatives and goals were discussed and mutually agreed upon: by patient  [name] 11/11/2020, 8:17 AM

## 2020-11-11 NOTE — Evaluation (Signed)
Speech Language Pathology Assessment and Plan  Patient Details  Name: Raymond Hurley. MRN: [id number] Date of Birth: 01/02/1931  SLP Diagnosis: Other (comment) (No SLP tx recommended)  Rehab Potential: Excellent ELOS: n/a No SLP treatment recommended.    Today's Date: 11/11/2020 SLP Individual Time: 1100-1200 SLP Individual Time Calculation (min): 60 min   Hospital Problem: Principal Problem:   Debility  Past Medical History:  Past Medical History:  Diagnosis Date  . Atrial fibrillation ([place])   . CAD (coronary artery disease) 04/2019  . CHF (congestive heart failure) ([place])   . Melanoma ([place])   . Vitamin B 12 deficiency    Past Surgical History:  Past Surgical History:  Procedure Laterality Date  . CARDIOVERSION N/A 06/02/2019   Procedure: CARDIOVERSION;  Surgeon: Jerline Pain, MD;  Location: [facility] ENDOSCOPY;  Service: Cardiovascular;  Laterality: N/A;  . IR FLUORO GUIDE CV LINE RIGHT  11/06/2020  . IR US GUIDE VASC ACCESS RIGHT  11/06/2020  . RIGHT/LEFT HEART CATH AND CORONARY ANGIOGRAPHY N/A 04/27/2019   Procedure: RIGHT/LEFT HEART CATH AND CORONARY ANGIOGRAPHY;  Surgeon: Jolaine Artist, MD;  Location: [facility] CV LAB;  Service: Cardiovascular;  Laterality: N/A;  . TEE WITHOUT CARDIOVERSION N/A 06/02/2019   Procedure: TRANSESOPHAGEAL ECHOCARDIOGRAM (TEE);  Surgeon: Jerline Pain, MD;  Location: [facility] ENDOSCOPY;  Service: Cardiovascular;  Laterality: N/A;    Assessment / Plan / Recommendation Clinical Impression   HPI: Raymond Hurley is an 87 year old male with history of melanoma, vitamin B12 deficiency, A. fib--on Eliquis who was admitted on 10/14/2020 with acute right facial droop, dysarthria, and difficulty following commands.  History taken from chart review and patient.  Family reported patient with recent diagnosis of Covid with diarrhea and poor p.o. intake.  MRI/MRA head was unremarkable for acute intracranial process.  He was found to have leukocytosis with  elevated BNP as well as acute kidney injury and elevated D-dimer.  He was treated with fluid protocol and IV antibiotics for septic shock. CT abdomen 01/07 showed multifocal pneumonia as well as severe inflammatory changes around pancreas consistent with severe pancreatitis as well as 4.6 x 2.3 cm low-density in right iliac is muscle concerning for developing abscess.  Dr. Alessandra Bevels consulted for input and pancreatitis treated with supportive care as well as course of meropenem and linezolid.  Acute renal failure felt to be due to to ATN with hypovolemia and hypotension.  He required CVVHD and has been transitioned to hemodialysis.  Acute hypoxic respiratory failure due to aspiration pneumonia treated with stress dose steroids with improvement with recommendations for slow prednisone taper.  Amiodarone was added for rate control of atrial fibrillation with RVR per cardiology input.  He has had issues with GIB due to hematochezia and required multiple transfusions and anticoagulation was discontinued.  Thrombocytopenia being monitored.  He completed antibiotic course on 10/31/2020 and follow-up CT abdomen 11/06/2020 showed severe acute pancreatitis with probable early pseudocyst formation as well as increased thickness in the right psoas muscle and no change in right iliac [place] muscle.  Current recommendations are to monitor off antibiotics. Encephalopathy improving, he is tolerating regular diet and currently noted to be significantly debilitated with generalized weakness affecting ability to stand or complete ADL tasks. CIR recommended due to functional decline.  Please see preadmission assessment from earlier today as well.  SLP evaluation completed. Pt presents with cognitive linguistic skills WFL. He was on a modified diet during acute rehab stay however he is currently on regular  diet and is tolerating well with no overt s/sx of aspiration or penetration. No SLP tx recommended at this time.    Skilled  Therapeutic Interventions          SLP evaluation completed and results were reviewed with patient. He is in agreement with plan.   SLP Assessment  Patient does not need any further Speech [place] [facility]    Recommendations  Patient destination: Home Follow up Recommendations: None Equipment Recommended: None recommended by SLP           Pain Pain Assessment Pain Scale: Faces Pain Score: 3  Faces Pain Scale: No hurt Pain Type: Acute pain Pain Location: Buttocks Pain Orientation: Lower Pain Descriptors / Indicators: Aching;Discomfort Pain Frequency: Intermittent Pain Onset: Gradual Pain Intervention(s): Medication (See eMAR);Repositioned  Prior Functioning Cognitive/Linguistic Baseline: Within functional limits Type of Home: House Available Help at Discharge: Family;Available 24 hours/day Vocation: Full time employment  SLP Evaluation Cognition Overall Cognitive Status: Within Functional Limits for tasks assessed Arousal/Alertness: Awake/alert Orientation Level: Oriented X4 Memory: Appears intact Awareness: Appears intact Problem Solving: Appears intact Safety/Judgment: Appears intact  Comprehension Auditory Comprehension Overall Auditory Comprehension: Appears within functional limits for tasks assessed Expression Expression Primary Mode of Expression: Verbal Verbal Expression Overall Verbal Expression: Appears within functional limits for tasks assessed Written Expression Dominant Hand: Right Written Expression: Within Functional Limits Oral Motor Oral Motor/Sensory Function Overall Oral Motor/Sensory Function: Within functional limits  Care Tool Care Tool Cognition Expression of Ideas and Wants Expression of Ideas and Wants: Without difficulty (complex and basic) - expresses complex messages without difficulty and with speech that is clear and easy to understand   Understanding Verbal and Non-Verbal Content Understanding Verbal and Non-Verbal  Content: Understands (complex and basic) - clear comprehension without cues or repetitions   Memory/Recall Ability *first 3 days only Memory/Recall Ability *first 3 days only: Current season;That he or she is in a hospital/hospital unit;Staff names and faces        Bedside Swallowing Assessment General Date of Onset: 10/15/20 Diet Prior to this Study: Regular;Thin liquids Respiratory Status: Room air History of Recent Intubation: No Behavior/Cognition: Alert;Cooperative;Pleasant mood Oral Cavity - Dentition: Adequate natural dentition Self-Feeding Abilities: Able to feed self Vision: Functional for self-feeding Patient Positioning: Upright in chair/Tumbleform Volitional Cough: Strong Volitional Swallow: Able to elicit  Oral Care Assessment Does patient have any of the following "high(er) risk" factors?: None of the above Does patient have any of the following "at risk" factors?: None of the above Ice Chips Ice chips: Within functional limits Thin Liquid Thin Liquid: Within functional limits Nectar Thick Nectar Thick Liquid: Not tested Honey Thick Honey Thick Liquid: Not tested Puree Puree: Not tested Solid Solid: Within functional limits Presentation: Self Fed BSE Assessment Suspected Esophageal Findings Suspected Esophageal Findings: Belching Risk for Aspiration Impact on safety and function: No limitations Other Related Risk Factors: History of pneumonia   Recommendations for other services: None   Discharge Criteria: Patient will be discharged from SLP if patient refuses treatment 3 consecutive times without medical reason, if treatment goals not met, if there is a change in medical status, if patient makes no progress towards goals or if patient is discharged from hospital.  The above assessment, treatment plan, treatment alternatives and goals were discussed and mutually agreed upon: by patient and by family  Darrol Poke Dyron Kawano 11/11/2020, 12:32 PM

## 2020-11-11 NOTE — Plan of Care (Signed)
  Problem: RH Balance Goal: LTG Patient will maintain dynamic sitting balance (PT) Description: LTG:  Patient will maintain dynamic sitting balance with assistance during mobility activities (PT) Flowsheets (Taken 11/11/2020 1800) LTG: Pt will maintain dynamic sitting balance during mobility activities with:: Independent Goal: LTG Patient will maintain dynamic standing balance (PT) Description: LTG:  Patient will maintain dynamic standing balance with assistance during mobility activities (PT) Flowsheets (Taken 11/11/2020 1800) LTG: Pt will maintain dynamic standing balance during mobility activities with:: Supervision/Verbal cueing   Problem: RH Bed Mobility Goal: LTG Patient will perform bed mobility with assist (PT) Description: LTG: Patient will perform bed mobility with assistance, with/without cues (PT). Flowsheets (Taken 11/11/2020 1800) LTG: Pt will perform bed mobility with assistance level of: Supervision/Verbal cueing   Problem: RH Bed to Chair Transfers Goal: LTG Patient will perform bed/chair transfers w/assist (PT) Description: LTG: Patient will perform bed to chair transfers with assistance (PT). Flowsheets (Taken 11/11/2020 1800) LTG: Pt will perform Bed to Chair Transfers with assistance level: Supervision/Verbal cueing   Problem: RH Car Transfers Goal: LTG Patient will perform car transfers with assist (PT) Description: LTG: Patient will perform car transfers with assistance (PT). Flowsheets (Taken 11/11/2020 1800) LTG: Pt will perform car transfers with assist:: Minimal Assistance - Patient > 75%   Problem: RH Ambulation Goal: LTG Patient will ambulate in controlled environment (PT) Description: LTG: Patient will ambulate in a controlled environment, # of feet with assistance (PT). Flowsheets (Taken 11/11/2020 1800) LTG: Pt will ambulate in controlled environ  assist needed:: Contact Guard/Touching assist LTG: Ambulation distance in controlled environment: 20ft with  LRAD Goal: LTG Patient will ambulate in home environment (PT) Description: LTG: Patient will ambulate in home environment, # of feet with assistance (PT). Flowsheets (Taken 11/11/2020 1800) LTG: Pt will ambulate in home environ  assist needed:: Contact Guard/Touching assist LTG: Ambulation distance in home environment: 26ft with LRAD   Problem: RH Wheelchair Mobility Goal: LTG Patient will propel w/c in controlled environment (PT) Description: LTG: Patient will propel wheelchair in controlled environment, # of feet with assist (PT) Flowsheets (Taken 11/11/2020 1800) LTG: Pt will propel w/c in controlled environ  assist needed:: Supervision/Verbal cueing LTG: Propel w/c distance in controlled environment: 131ft   Problem: RH Stairs Goal: LTG Patient will ambulate up and down stairs w/assist (PT) Description: LTG: Patient will ambulate up and down # of stairs with assistance (PT) Flowsheets (Taken 11/11/2020 1800) LTG: Pt will ambulate up/down stairs assist needed:: Minimal Assistance - Patient > 75% LTG: Pt will  ambulate up and down number of stairs: 3 steps with BUE support

## 2020-11-11 NOTE — Progress Notes (Signed)
[place] PHYSICAL MEDICINE & REHABILITATION PROGRESS NOTE   Subjective/Complaints:  Pt notes some diarrhea in the evening , no abd pain, feels like he needs HD today   ROS neg CP, SOB, N/V/D  Objective:   No results found. Recent Labs    11/09/20 0344 11/11/20 0622  WBC 38.6* 32.1*  HGB 7.6* 7.3*  HCT 24.1* 21.2*  PLT 100* 123*   Recent Labs    11/09/20 0344 11/11/20 0622  NA 136 138  K 4.9 4.3  CL 100 102  CO2 16* 18*  GLUCOSE 74 97  BUN 109* 103*  CREATININE 7.50* 7.35*  CALCIUM 8.3* 8.4*    Intake/Output Summary (Last 24 hours) at 11/11/2020 1010 Last data filed at 11/11/2020 0700 Gross per 24 hour  Intake 580 ml  Output -  Net 580 ml     Pressure Injury 11/10/20 Buttocks Right;Mid Unstageable - Full thickness tissue loss in which the base of the injury is covered by slough (yellow, tan, gray, green or brown) and/or eschar (tan, brown or black) in the wound bed. red open area of partial s (Active)  11/10/20 1721  Location: Buttocks  Location Orientation: Right;Mid  Staging: Unstageable - Full thickness tissue loss in which the base of the injury is covered by slough (yellow, tan, gray, green or brown) and/or eschar (tan, brown or black) in the wound bed.  Wound Description (Comments): red open area of partial skin loss with eschar covering wound bed  Present on Admission: Yes    Physical Exam: Vital Signs Blood pressure (!) 112/57, pulse 76, temperature 97.6 F (36.4 C), temperature source Oral, resp. rate 20, height 5\' 10"  (1.778 m), weight 91.9 kg, SpO2 97 %.   General: No acute distress Mood and affect are appropriate Heart: Regular rate and rhythm no rubs murmurs or extra sounds Lungs: Clear to auscultation, breathing unlabored, no rales or wheezes Abdomen: Positive bowel sounds, soft nontender to palpation, distended Extremities: No clubbing, cyanosis,1+ bilateral pretibial  edema Skin: No evidence of breakdown, no evidence of  rash Neurologic: Cranial nerves II through XII intact, motor strength is 4/5 in bilateral deltoid, bicep, tricep, grip, hip flexor, knee extensors, ankle dorsiflexor and plantar flexor  Musculoskeletal: Full range of motion in all 4 extremities. No joint swelling   Assessment/Plan: 1. Functional deficits which require 3+ hours per day of interdisciplinary therapy in a comprehensive inpatient rehab setting.  Physiatrist is providing close team supervision and 24 hour management of active medical problems listed below.  Physiatrist and rehab team continue to assess barriers to discharge/monitor patient progress toward functional and medical goals  Care Tool:  Bathing    Body parts bathed by patient: Right arm,Left arm,Chest,Abdomen,Front perineal area,Right upper leg,Left upper leg,Face   Body parts bathed by helper: Buttocks,Right lower leg,Left lower leg     Bathing assist Assist Level: Moderate Assistance - Patient 50 - 74%     Upper Body Dressing/Undressing Upper body dressing   What is the patient wearing?: Pull over shirt    Upper body assist Assist Level: Minimal Assistance - Patient > 75%    Lower Body Dressing/Undressing Lower body dressing      What is the patient wearing?: Pants     Lower body assist Assist for lower body dressing: Maximal Assistance - Patient 25 - 49%     Toileting Toileting    Toileting assist Assist for toileting: Maximal Assistance - Patient 25 - 49%     Transfers Chair/bed transfer  Transfers assist  Locomotion Ambulation   Ambulation assist              Walk 10 feet activity   Assist           Walk 50 feet activity   Assist           Walk 150 feet activity   Assist           Walk 10 feet on uneven surface  activity   Assist           Wheelchair     Assist               Wheelchair 50 feet with 2 turns activity    Assist            Wheelchair 150  feet activity     Assist          Blood pressure (!) 112/57, pulse 76, temperature 97.6 F (36.4 C), temperature source Oral, resp. rate 20, height 5\' 10"  (1.778 m), weight 91.9 kg, SpO2 97 %.    Medical Problem List and Plan: 1.  Generalized weakness affecting ability to stand or complete ADL tasks secondary to debility.             -patient may shower as tolerated             -ELOS/Goals: 18-22 days/Min A/supervision             Admit to CIR PT,OT,  evals today 2.  Antithrombotics: -DVT/anticoagulation:  Mechanical: Sequential compression devices, below knee Bilateral lower extremities due to GI bleed.             -antiplatelet therapy: N/A 3. Pain Management: Tylenol prn.  4. Mood: LCSW to follow for evaluation and support.              -antipsychotic agents: N/a 5. Neuropsych: This patient is capable of making decisions on his own behalf. 6. Skin/Wound Care: Routine pressure relief measures.  7. Fluids/Electrolytes/Nutrition: Strict I/O.  Labs with HD.discussed 129ml fluid restriction  8.  ESRD: On midodrine prior to HD to help support BP.   9. Chronic systolic CHF/CAF: Strict I/Os. Fluid status managed with HD. On Amiodarone 400 mg bid for rate control.              Daily weights             Monitor heart rate with increased activity 10.  Pancreatitis: Has completed antibiotic course. Tolerating diet without nausea, diarrhea or abdominal pain.              Monitor closely. 11. LGIB: On Carafate ac/hs.  12. Acute on chronic anemia: On aranresp weekly.              Labs with HD 13. Thrombocytopenia: Monitor for signs of bleeding.               Labs with HD 14. Acute respiratory failure: Has resolved. prednisone 40 mg daily   LOS: 1 days A FACE TO FACE EVALUATION WAS PERFORMED  Charlett Blake 11/11/2020, 10:10 AM

## 2020-11-11 NOTE — Evaluation (Signed)
Physical Therapy Assessment and Plan  Patient Details  Name: Raymond Hurley. MRN: [id number] Date of Birth: 1931/01/09  PT Diagnosis: Abnormal posture, Difficulty walking, Edema and Muscle weakness Rehab Potential: Good ELOS: 18-22 days   Today's Date: 11/11/2020 PT Individual Time: 0920-1015 PT Individual Time Calculation (min): 55 min    Hospital Problem: Principal Problem:   Debility   Past Medical History:  Past Medical History:  Diagnosis Date  . Atrial fibrillation ([place])   . CAD (coronary artery disease) 04/2019  . CHF (congestive heart failure) ([place])   . Melanoma ([place])   . Vitamin B 12 deficiency    Past Surgical History:  Past Surgical History:  Procedure Laterality Date  . CARDIOVERSION N/A 06/02/2019   Procedure: CARDIOVERSION;  Surgeon: Jerline Pain, MD;  Location: [facility] ENDOSCOPY;  Service: Cardiovascular;  Laterality: N/A;  . IR FLUORO GUIDE CV LINE RIGHT  11/06/2020  . IR US GUIDE VASC ACCESS RIGHT  11/06/2020  . RIGHT/LEFT HEART CATH AND CORONARY ANGIOGRAPHY N/A 04/27/2019   Procedure: RIGHT/LEFT HEART CATH AND CORONARY ANGIOGRAPHY;  Surgeon: Jolaine Artist, MD;  Location: [facility] CV LAB;  Service: Cardiovascular;  Laterality: N/A;  . TEE WITHOUT CARDIOVERSION N/A 06/02/2019   Procedure: TRANSESOPHAGEAL ECHOCARDIOGRAM (TEE);  Surgeon: Jerline Pain, MD;  Location: [facility] ENDOSCOPY;  Service: Cardiovascular;  Laterality: N/A;    Assessment & Plan Clinical Impression: Patient is a 87 year old male with history of melanoma, vitamin B12 deficiency, A. fib--on Eliquis who was admitted on 10/14/2020 with acute right facial droop, dysarthria, and difficulty following commands.  History taken from chart review and patient.  Family reported patient with recent diagnosis of Covid with diarrhea and poor p.o. intake.  MRI/MRA head was unremarkable for acute intracranial process.  He was found to have leukocytosis with elevated BNP as well as acute kidney injury and  elevated D-dimer.  He was treated with fluid protocol and IV antibiotics for septic shock. CT abdomen 01/07 showed multifocal pneumonia as well as severe inflammatory changes around pancreas consistent with severe pancreatitis as well as 4.6 x 2.3 cm low-density in right iliac is muscle concerning for developing abscess.  Dr. Alessandra Bevels consulted for input and pancreatitis treated with supportive care as well as course of meropenem and linezolid.  Acute renal failure felt to be due to to ATN with hypovolemia and hypotension.  He required CVVHD and has been transitioned to hemodialysis.  Acute hypoxic respiratory failure due to aspiration pneumonia treated with stress dose steroids with improvement with recommendations for slow prednisone taper.  Amiodarone was added for rate control of atrial fibrillation with RVR per cardiology input.  He has had issues with GIB due to hematochezia and required multiple transfusions and anticoagulation was discontinued.  Thrombocytopenia being monitored.  He completed antibiotic course on 10/31/2020 and follow-up CT abdomen 11/06/2020 showed severe acute pancreatitis with probable early pseudocyst formation as well as increased thickness in the right psoas muscle and no change in right iliac [place] muscle.  Current recommendations are to monitor off antibiotics. Encephalopathy improving, he is tolerating regular diet and currently noted to be significantly debilitated with generalized weakness affecting ability to stand or complete ADL tasks. Patient transferred to CIR on 11/10/2020 .   Patient currently requires mod with mobility secondary to muscle weakness and muscle joint tightness, decreased cardiorespiratoy endurance and decreased sitting balance, decreased standing balance and decreased balance strategies.  Prior to hospitalization, patient was independent  with mobility and lived with  in a House ([place]) home.  Home access is  Level entry (with one small step in  back).  Patient will benefit from skilled PT intervention to maximize safe functional mobility, minimize fall risk and decrease caregiver burden for planned discharge home with 24 hour supervision.  Anticipate patient will benefit from follow up [place] at discharge.  PT - End of Session Activity Tolerance: Tolerates < 10 min activity, no significant change in vital signs Endurance Deficit: Yes PT Assessment Rehab Potential (ACUTE/IP ONLY): Good PT Barriers to Discharge: Home environment access/layout;Medication compliance;Hemodialysis PT Patient demonstrates impairments in the following area(s): Balance;Edema;Endurance;Pain;Sensory;Skin Integrity PT Transfers Functional Problem(s): Bed Mobility;Bed to Chair;Car;Furniture PT Locomotion Functional Problem(s): Ambulation;Stairs;Wheelchair Mobility PT Plan PT Intensity: Minimum of 1-2 x/day ,45 to 90 minutes PT Frequency: 5 out of 7 days PT Duration Estimated Length of Stay: 18-22 days PT Treatment/Interventions: Ambulation/gait training;Balance/vestibular training;Community reintegration;Cognitive remediation/compensation;Discharge planning;Disease management/prevention;DME/adaptive equipment instruction;Functional mobility training;Neuromuscular re-education;Patient/family education;Pain management;Psychosocial support;Skin care/wound management;Splinting/orthotics;Stair training;Therapeutic Exercise;UE/LE Coordination activities;Visual/perceptual remediation/compensation;Wheelchair propulsion/positioning;UE/LE Strength taining/ROM;Therapeutic Activities PT Transfers Anticipated Outcome(s): Supervision assist with LRAD PT Locomotion Anticipated Outcome(s): ambulatory at [place] level with LRAD for household distances. PT Recommendation Recommendations for Other Services: Therapeutic Recreation consult Follow Up Recommendations: Home health PT Patient destination: Home Equipment Recommended: Rolling walker with 5" wheels;Wheelchair  (measurements);Wheelchair cushion (measurements)   PT Evaluation Precautions/Restrictions    Fall dialysis General   Vital SignsTherapy Vitals Temp: (!) 97.5 F (36.4 C) Pulse Rate: 79 Resp: 18 BP: 115/64 Patient Position (if appropriate): Lying Oxygen Therapy SpO2: 95 % O2 Device: Room Air Pain   denies Home Living/Prior Functioning Home Living Available Help at Discharge: Family;Available 24 hours/day Type of Home: House (condo) Home Access: Level entry (with one small step in back) Home Layout: One level;Other (Comment);Laundry or work area in basement [employer] Shower/Tub: Chiropodist: Standard Additional Comments: spouse is healthy; son works for family business Prior Function Level of Independence: Independent with basic ADLs;Independent with homemaking with ambulation  Able to Take Stairs?: Yes Driving: Yes Vocation: Full time employment Comments: driving; work full-time; Careers adviser; no AD Vision/Perception  Geologist, engineering: Within Financial controller Praxis: Intact  Cognition Overall Cognitive Status: Within Functional Limits for tasks assessed Arousal/Alertness: Awake/alert Memory: Appears intact Awareness: Appears intact Problem Solving: Appears intact Safety/Judgment: Appears intact Sensation Sensation Light Touch: Appears Intact Coordination Gross Motor Movements are Fluid and Coordinated: No Fine Motor Movements are Fluid and Coordinated: No Coordination and Movement Description: gross strength deficits RLE>LLE Heel Shin Test: limited on the R Motor  Motor Motor: Abnormal postural alignment and control Motor - Skilled Clinical Observations: debility   Trunk/Postural Assessment  Cervical Assessment Cervical Assessment: Exceptions to [facility] (head forward) Thoracic Assessment Thoracic Assessment: Exceptions to [facility] (rounded shoudlers) Lumbar Assessment Lumbar Assessment: Exceptions to [facility] (post pelvic  tilt) Postural Control Postural Control: Deficits on evaluation (delayed/insufficient)  Balance Balance Balance Assessed: Yes Static Sitting Balance Static Sitting - Balance Support: Right upper extremity supported;Left upper extremity supported Static Sitting - Level of Assistance: 6: Modified independent (Device/Increase time) Dynamic Sitting Balance Dynamic Sitting - Balance Support: Right upper extremity supported;Left upper extremity supported Dynamic Sitting - Level of Assistance: 5: Stand by assistance Static Standing Balance Static Standing - Balance Support: Bilateral upper extremity supported Static Standing - Level of Assistance: 4: Min assist Dynamic Standing Balance Dynamic Standing - Balance Support: Bilateral upper extremity supported Dynamic Standing - Level of Assistance: 3: Mod assist Extremity Assessment      RLE Assessment RLE  Assessment: Exceptions to [facility] General Strength Comments: grossly 4-/5 proximal to distal. limited by edema LLE Assessment LLE Assessment: Exceptions to [facility] General Strength Comments: grossly 4/5 proximal to distal  Care Tool Care Tool Bed Mobility Roll left and right activity   Roll left and right assist level: Minimal Assistance - Patient > 75%    Sit to lying activity   Sit to lying assist level: Moderate Assistance - Patient 50 - 74%    Lying to sitting edge of bed activity   Lying to sitting edge of bed assist level: Moderate Assistance - Patient 50 - 74%     Care Tool Transfers Sit to stand transfer   Sit to stand assist level: Moderate Assistance - Patient 50 - 74%    Chair/bed transfer   Chair/bed transfer assist level: Moderate Assistance - Patient 50 - 74%     Psychologist, counselling transfer activity did not occur: Safety/medical concerns        Care Tool Locomotion Ambulation Ambulation activity did not occur: Safety/medical concerns        Walk 10 feet activity Walk 10 feet activity did  not occur: Safety/medical concerns       Walk 50 feet with 2 turns activity Walk 50 feet with 2 turns activity did not occur: Safety/medical concerns      Walk 150 feet activity Walk 150 feet activity did not occur: Safety/medical concerns      Walk 10 feet on uneven surfaces activity Walk 10 feet on uneven surfaces activity did not occur: Safety/medical concerns      Stairs Stair activity did not occur: Safety/medical concerns        Walk up/down 1 step activity Walk up/down 1 step or curb (drop down) activity did not occur: Safety/medical concerns     Walk up/down 4 steps activity did not occuR: Safety/medical concerns  Walk up/down 4 steps activity      Walk up/down 12 steps activity Walk up/down 12 steps activity did not occur: Safety/medical concerns      Pick up small objects from floor Pick up small object from the floor (from standing position) activity did not occur: Safety/medical concerns      Wheelchair Will patient use wheelchair at discharge?: Yes Type of Wheelchair: Manual   Wheelchair assist level: Supervision/Verbal cueing Max wheelchair distance: 100  Wheel 50 feet with 2 turns activity   Assist Level: Supervision/Verbal cueing  Wheel 150 feet activity   Assist Level: Minimal Assistance - Patient > 75%    Refer to Care Plan for Long Term Goals  SHORT TERM GOAL WEEK 1 PT Short Term Goal 1 (Week 1): Pt will trasnfer to WC with CGA and LRAD PT Short Term Goal 2 (Week 1): Pt will performed bed mobility with CGA PT Short Term Goal 3 (Week 1): Pt will ambulate 42f with min assist and LRAD PT Short Term Goal 4 (Week 1): Pt will propell WC [id number] supervision assist PT Short Term Goal 5 (Week 1): pt [facility] tolerate [facility] therapies  Recommendations for other services: Therapeutic Recreation  Stress management  Skilled Therapeutic Intervention  Pt received supine in bed and agreeable to PT. Supine>sit transfer with mod assist  from PT. Dressing performed with mod assist from PT at bed level. PT instructed patient in PT Evaluation and initiated treatment intervention; see above for results. PT educated [place] in [place], rehab goals,  and discharge recommendations along with recommendation for follow-up rehabilitation services.  Stand pivot transfer to [facility] with min assist and RW. Mild back discomfort in standing,  But pt does not rate. WC mobility and gait training as listed below. Due to fatigue, pt unable to perform car transfers for stair training at this time. Patient returned to room and left sitting in [facility] with call bell in reach and all needs met.       Mobility Bed Mobility Bed Mobility: Rolling Right;Rolling Left;Supine to Sit;Sit to Supine Rolling Right: Minimal Assistance - Patient > 75% Rolling Left: Minimal Assistance - Patient > 75% Supine to Sit: Moderate Assistance - Patient 50-74% Sit to Supine: Moderate Assistance - Patient 50-74% Transfers Transfers: Sit to Stand;Stand to Sit Sit to Stand: Minimal Assistance - Patient > 75% Stand to Sit: Minimal Assistance - Patient > 75% Transfer (Assistive device): Rolling walker Locomotion  Gait Ambulation: Yes Gait Assistance: Minimal Assistance - Patient > 75% Gait Distance (Feet): 8 Feet Assistive device: Parallel bars Gait Assistance Details: Tactile cues for placement;Verbal cues for gait pattern;Verbal cues for precautions/safety Gait Gait: Yes Gait Pattern: Impaired Gait Pattern: Right foot flat;Right flexed knee in stance;Left flexed knee in stance;Poor foot clearance - right Stairs / Additional Locomotion Stairs: No Wheelchair Mobility Wheelchair Mobility: Yes Wheelchair Assistance: Chartered loss adjuster: Both upper extremities Wheelchair Parts Management: Needs assistance Distance: 100   Discharge Criteria: Patient will be discharged from PT if patient refuses treatment 3 consecutive times without  medical reason, if treatment goals not met, if there is a change in medical status, if patient makes no progress towards goals or if patient is discharged from hospital.  The above assessment, treatment plan, treatment alternatives and goals were discussed and mutually agreed upon: by patient  Lorie Phenix 11/11/2020, 6:14 PM

## 2020-11-12 DIAGNOSIS — R5381 Other malaise: Secondary | ICD-10-CM | POA: Diagnosis not present

## 2020-11-12 LAB — GLUCOSE, CAPILLARY
Glucose-Capillary: 80 mg/dL (ref 70–99)
Glucose-Capillary: 120 mg/dL — ABNORMAL HIGH (ref 70–99)
Glucose-Capillary: 162 mg/dL — ABNORMAL HIGH (ref 70–99)
Glucose-Capillary: 135 mg/dL — ABNORMAL HIGH (ref 70–99)

## 2020-11-12 NOTE — Progress Notes (Addendum)
[facility] PHYSICAL MEDICINE & REHABILITATION PROGRESS NOTE   Subjective/Complaints:  On phone face time with multiple family members   ROS neg CP, SOB, N/V/D  Objective:   No results found. Recent Labs    11/11/20 0622  WBC 32.1*  HGB 7.3*  HCT 21.2*  PLT 123*   Recent Labs    11/11/20 0622  NA 138  K 4.3  CL 102  CO2 18*  GLUCOSE 97  BUN 103*  CREATININE 7.35*  CALCIUM 8.4*    Intake/Output Summary (Last 24 hours) at 11/12/2020 1010 Last data filed at 11/12/2020 0700 Gross per 24 hour  Intake 320 ml  Output 1638 ml  Net -1318 ml     Pressure Injury 11/10/20 Buttocks Right;Mid Unstageable - Full thickness tissue loss in which the base of the injury is covered by slough (yellow, tan, gray, green or brown) and/or eschar (tan, brown or black) in the wound bed. red open area of partial s (Active)  11/10/20 1721  Location: Buttocks  Location Orientation: Right;Mid  Staging: Unstageable - Full thickness tissue loss in which the base of the injury is covered by slough (yellow, tan, gray, green or brown) and/or eschar (tan, brown or black) in the wound bed.  Wound Description (Comments): red open area of partial skin loss with eschar covering wound bed  Present on Admission: Yes    Physical Exam: Vital Signs Blood pressure (!) 108/52, pulse 75, temperature 97.8 F (36.6 C), temperature source Oral, resp. rate 18, height 5\' 10"  (1.778 m), weight 91.6 kg, SpO2 98 %.    General: No acute distress Mood and affect are appropriate Heart: Regular rate and rhythm no rubs murmurs or extra sounds Lungs: Clear to auscultation, breathing unlabored, no rales or wheezes Abdomen: Positive bowel sounds, soft nontender to palpation, distended, no hepatomegaly or splenomegaly noted  Extremities: No clubbing, cyanosis, or edema Skin: No evidence of breakdown, no evidence of rash   Neurologic motor strength is 4/5 in bilateral deltoid, bicep, tricep, grip, hip flexor, knee  extensors, ankle dorsiflexor and plantar flexor  Musculoskeletal: Full range of motion in all 4 extremities. No joint swelling   Assessment/Plan: 1. Functional deficits which require 3+ hours per day of interdisciplinary therapy in a comprehensive inpatient rehab setting.  Physiatrist is providing close team supervision and 24 hour management of active medical problems listed below.  Physiatrist and rehab team continue to assess barriers to discharge/monitor patient progress toward functional and medical goals  Care Tool:  Bathing    Body parts bathed by patient: Right arm,Left arm,Chest,Abdomen,Front perineal area,Right upper leg,Left upper leg,Face   Body parts bathed by helper: Buttocks,Right lower leg,Left lower leg     Bathing assist Assist Level: Moderate Assistance - Patient 50 - 74%     Upper Body Dressing/Undressing Upper body dressing   What is the patient wearing?: Pull over shirt    Upper body assist Assist Level: Minimal Assistance - Patient > 75%    Lower Body Dressing/Undressing Lower body dressing      What is the patient wearing?: Pants     Lower body assist Assist for lower body dressing: Maximal Assistance - Patient 25 - 49%     Toileting Toileting    Toileting assist Assist for toileting: Maximal Assistance - Patient 25 - 49%     Transfers Chair/bed transfer  Transfers assist     Chair/bed transfer assist level: Moderate Assistance - Patient 50 - 74%     Locomotion Ambulation  Ambulation assist   Ambulation activity did not occur: Safety/medical concerns          Walk 10 feet activity   Assist  Walk 10 feet activity did not occur: Safety/medical concerns        Walk 50 feet activity   Assist Walk 50 feet with 2 turns activity did not occur: Safety/medical concerns         Walk 150 feet activity   Assist Walk 150 feet activity did not occur: Safety/medical concerns         Walk 10 feet on uneven surface   activity   Assist Walk 10 feet on uneven surfaces activity did not occur: Safety/medical concerns         Wheelchair     Assist Will patient use wheelchair at discharge?: Yes Type of Wheelchair: Manual    Wheelchair assist level: Supervision/Verbal cueing Max wheelchair distance: 100    Wheelchair 50 feet with 2 turns activity    Assist        Assist Level: Supervision/Verbal cueing   Wheelchair 150 feet activity     Assist      Assist Level: Minimal Assistance - Patient > 75%   Blood pressure (!) 108/52, pulse 75, temperature 97.8 F (36.6 C), temperature source Oral, resp. rate 18, height 5\' 10"  (1.778 m), weight 91.6 kg, SpO2 98 %.    Medical Problem List and Plan: 1.  Generalized weakness affecting ability to stand or complete ADL tasks secondary to debility.             -patient may shower as tolerated             -ELOS/Goals: 18-22 days/Min A/supervision      CIR PT,OT,  2.  Antithrombotics: -DVT/anticoagulation:  Mechanical: Sequential compression devices, below knee Bilateral lower extremities due to GI bleed.             -antiplatelet therapy: N/A 3. Pain Management: Tylenol prn.  4. Mood: LCSW to follow for evaluation and support.              -antipsychotic agents: N/a 5. Neuropsych: This patient is capable of making decisions on his own behalf. 6. Skin/Wound Care: Routine pressure relief measures.  7. Fluids/Electrolytes/Nutrition: Strict I/O.  Labs with HD.discussed 1232ml fluid restriction  8.  ESRD: On midodrine prior to HD to help support BP.   9. Chronic systolic CHF/CAF: Strict I/Os. Fluid status managed with HD. On Amiodarone 400 mg bid for rate control.              Daily weights             Monitor heart rate with increased activity 10.  Pancreatitis: Has completed antibiotic course. Tolerating diet without nausea, diarrhea or abdominal pain.              Monitor closely. 11. LGIB: On Carafate ac/hs.  12. Acute on chronic  anemia: On aranresp weekly.              Labs with HD 13. Thrombocytopenia: Monitor for signs of bleeding.  improving plt 123K             Labs with HD 14. Acute respiratory failure: Has resolved. prednisone 40 mg daily  15.  Leukocytosis WBC trending down to 32K , afebrile  LOS: 2 days A FACE TO FACE EVALUATION WAS PERFORMED  Charlett Blake 11/12/2020, 10:10 AM

## 2020-11-12 NOTE — Progress Notes (Signed)
[facility] Progress Note     Assessment/ Plan:   1. Dialysis dependent AKI 2/2 ATN. 02/2020 Cr 1.2. 1. CRRT 1/4-1/12. And now tol iHD 2. Patient/family desire ongoing dialysis and full care 3. IR  tunneled dialysis catheter, on 1/24 4. Began clip process as AKI, SW aware - going to [place] MWF 5. Tolerating HD better after addition of midodrine, last on 1/30 though UF a bit lower.  Will  maximize fluid removal with HD given persistent LE edema.  Midodrine prior.  Profile 2, low temp.  Next HD will be on 2/1.    2. Acute pancreatitis: Noted on CT scan 10/16/20 and again on 1/7. 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   3. Acute hypoxemic RF: likely aspiration PNA +/- COVID pneumonitis, now on RA. S/p speroids.  Resolved and off isolation now.    4. Shock: high WBC count, w/u per primary team. Possible ilacus abscess on CT from 1/7. Off levophed 1/11. Completed courese of Mero, zyvox. Slowly improving but WBC remains 32k.       5Acute encephalopathy: multifactorial, likely COVID + acute illness. Improved.   6.  Chronic systolic CHF: EF 4/0/81 on TTE 60-65% with no WMA.   7.  Afib: hep off with GIB; amiodarone po   8. Acute anemia: GI losses + AKI/CKD. Received multiple blood transfusion.  He has a history of metastatic melanoma.  On 1/22, Dr. Carolin Sicks discussed with multiple family members via video call in the patient's room.  The family discussed with the patient's oncologist at [facility] who reportedly said no contraindication for erythropoietin to manage anemia. Being treated with IV iron and ESA .  Running in mid 7s, transfuse < 7.   9.  Thrombocytopenia:  Initially normal, into 50s now back to 120s.  No heparin with dialysis.   Subjective:   Moved to CIR. Remains anuric.  Feeling good today.  UF 1.6L yesterday with HD   Objective:   BP (!) 99/57 (BP Location: Left Arm)   Pulse 82   Temp 97.7 F  (36.5 C)   Resp 18   Ht 5\' 10"  (1.778 m)   Wt 91.6 kg   SpO2 98%   BMI 28.98 kg/m   Intake/Output Summary (Last 24 hours) at 11/12/2020 1340 Last data filed at 11/12/2020 0700 Gross per 24 hour  Intake 320 ml  Output 1638 ml  Net -1318 ml   Weight change: 0.4 kg  Physical Exam: GEN: Not in distress, comfortable on RA NECK: Supple, no thyromegaly LUNGS: Clear bilateral, no rhonchi CV: RRR, No M/R/G ABD: SNDNT +BS  EXT: 2+  extremity edema with some improvement over the week but still significant ACCESS: RIJ TDC site clean  Imaging: No results found.  Labs: BMET Recent Labs  Lab 11/06/20 0027 11/07/20 0200 11/09/20 0344 11/11/20 0622  NA 137 133* 136 138  K 4.6 4.2 4.9 4.3  CL 101 97* 100 102  CO2 17* 16* 16* 18*  GLUCOSE 123* 117* 74 97  BUN 110* 135* 109* 103*  CREATININE 7.26* 8.52* 7.50* 7.35*  CALCIUM 7.8* 7.8* 8.3* 8.4*  PHOS 10.6*  --  9.4* 7.9*   CBC Recent Labs  Lab 11/07/20 0200 11/08/20 0223 11/09/20 0344 11/11/20 0622  WBC 41.4* 42.3* 38.6* 32.1*  NEUTROABS  --   --  27.0*  --   HGB 7.8* 7.4* 7.6* 7.3*  HCT 23.6* 22.0* 24.1* 21.2*  MCV  89.4 87.6 93.1 88.7  PLT 86* 92* 100* 123*    Medications:    . acetaminophen (TYLENOL) oral liquid 160 mg/5 mL  650 mg Oral Q6H  . amiodarone  400 mg Per Tube BID  . B-complex with vitamin C  1 tablet Per Tube Daily  . Chlorhexidine Gluconate Cloth  6 each Topical Q0600  . feeding supplement (NEPRO CARB STEADY)  237 mL Oral TID WC  . Gerhardt's butt cream   Topical TID  . insulin aspart  3-9 Units Subcutaneous Q4H  . lactobacillus acidophilus  2 tablet Oral TID  . lidocaine  1 patch Transdermal Q24H  . mouth rinse  15 mL Mouth Rinse BID  . [START ON 11/13/2020] midodrine  10 mg Oral Q M,W,F-HD  . predniSONE  40 mg Per Tube Q breakfast  . sucralfate  1 g Oral TID WC & HS  . triamcinolone 0.1 % cream : eucerin   Topical BID    Justin Mend, MD  11/12/2020, 1:40 PM

## 2020-11-13 DIAGNOSIS — Z515 Encounter for palliative care: Secondary | ICD-10-CM

## 2020-11-13 DIAGNOSIS — R739 Hyperglycemia, unspecified: Secondary | ICD-10-CM

## 2020-11-13 DIAGNOSIS — I5022 Chronic systolic (congestive) heart failure: Secondary | ICD-10-CM

## 2020-11-13 DIAGNOSIS — D696 Thrombocytopenia, unspecified: Secondary | ICD-10-CM

## 2020-11-13 DIAGNOSIS — D72829 Elevated white blood cell count, unspecified: Secondary | ICD-10-CM

## 2020-11-13 DIAGNOSIS — D72828 Other elevated white blood cell count: Secondary | ICD-10-CM | POA: Diagnosis not present

## 2020-11-13 DIAGNOSIS — D649 Anemia, unspecified: Secondary | ICD-10-CM

## 2020-11-13 DIAGNOSIS — Z992 Dependence on renal dialysis: Secondary | ICD-10-CM

## 2020-11-13 DIAGNOSIS — T380X5A Adverse effect of glucocorticoids and synthetic analogues, initial encounter: Secondary | ICD-10-CM

## 2020-11-13 DIAGNOSIS — N186 End stage renal disease: Secondary | ICD-10-CM

## 2020-11-13 DIAGNOSIS — R5381 Other malaise: Secondary | ICD-10-CM | POA: Diagnosis not present

## 2020-11-13 LAB — RENAL FUNCTION PANEL
Albumin: 2.3 g/dL — ABNORMAL LOW (ref 3.5–5.0)
Anion gap: 19 — ABNORMAL HIGH (ref 5–15)
BUN: 82 mg/dL — ABNORMAL HIGH (ref 8–23)
CO2: 21 mmol/L — ABNORMAL LOW (ref 22–32)
Calcium: 8.5 mg/dL — ABNORMAL LOW (ref 8.9–10.3)
Chloride: 98 mmol/L (ref 98–111)
Creatinine, Ser: 6.63 mg/dL — ABNORMAL HIGH (ref 0.61–1.24)
GFR, Estimated: 8 mL/min — ABNORMAL LOW (ref >60)
Glucose, Bld: 116 mg/dL — ABNORMAL HIGH (ref 70–99)
Phosphorus: 6.2 mg/dL — ABNORMAL HIGH (ref 2.5–4.6)
Potassium: 4.1 mmol/L (ref 3.5–5.1)
Sodium: 138 mmol/L (ref 135–145)

## 2020-11-13 LAB — CBC
HCT: 22.4 % — ABNORMAL LOW (ref 39.0–52.0)
Hemoglobin: 7.4 g/dL — ABNORMAL LOW (ref 13.0–17.0)
MCH: 30.2 pg (ref 26.0–34.0)
MCHC: 33 g/dL (ref 30.0–36.0)
MCV: 91.4 fL (ref 80.0–100.0)
Platelets: 129 10*3/uL — ABNORMAL LOW (ref 150–400)
RBC: 2.45 MIL/uL — ABNORMAL LOW (ref 4.22–5.81)
RDW: 22.6 % — ABNORMAL HIGH (ref 11.5–15.5)
WBC: 28.4 10*3/uL — ABNORMAL HIGH (ref 4.0–10.5)
nRBC: 0.2 % (ref 0.0–0.2)

## 2020-11-13 LAB — GLUCOSE, CAPILLARY
Glucose-Capillary: 102 mg/dL — ABNORMAL HIGH (ref 70–99)
Glucose-Capillary: 104 mg/dL — ABNORMAL HIGH (ref 70–99)
Glucose-Capillary: 119 mg/dL — ABNORMAL HIGH (ref 70–99)
Glucose-Capillary: 127 mg/dL — ABNORMAL HIGH (ref 70–99)
Glucose-Capillary: 127 mg/dL — ABNORMAL HIGH (ref 70–99)
Glucose-Capillary: 154 mg/dL — ABNORMAL HIGH (ref 70–99)
Glucose-Capillary: 94 mg/dL (ref 70–99)

## 2020-11-13 MED ORDER — MILK AND MOLASSES ENEMA
1.0000 | Freq: Every day | RECTAL | Status: DC | PRN
  Filled 2020-11-13: qty 240

## 2020-11-13 MED ORDER — SENNOSIDES-DOCUSATE SODIUM 8.6-50 MG PO TABS
2.0000 | ORAL_TABLET | Freq: Every day | ORAL | Status: DC
  Administered 2020-11-13 – 2020-11-16 (×5): 2 via ORAL
  Filled 2020-11-13 (×5): qty 2

## 2020-11-13 MED ORDER — B COMPLEX-C PO TABS
1.0000 | ORAL_TABLET | Freq: Every day | ORAL | Status: DC
  Administered 2020-11-14 – 2020-12-05 (×22): 1 via ORAL
  Filled 2020-11-13 (×22): qty 1

## 2020-11-13 MED ORDER — AMIODARONE HCL 200 MG PO TABS
400.0000 mg | ORAL_TABLET | Freq: Two times a day (BID) | ORAL | Status: DC
  Administered 2020-11-13 – 2020-12-01 (×36): 400 mg via ORAL
  Filled 2020-11-13 (×36): qty 2

## 2020-11-13 MED ORDER — HYDROCOD POLST-CPM POLST ER 10-8 MG/5ML PO SUER: 5.0000 mL | Freq: Two times a day (BID) | ORAL | Status: DC | PRN

## 2020-11-13 MED ORDER — PREDNISONE 20 MG PO TABS
40.0000 mg | ORAL_TABLET | Freq: Every day | ORAL | Status: DC
  Administered 2020-11-14 – 2020-11-16 (×3): 40 mg via ORAL
  Filled 2020-11-13 (×3): qty 2

## 2020-11-13 MED ORDER — FUROSEMIDE 40 MG PO TABS
80.0000 mg | ORAL_TABLET | Freq: Two times a day (BID) | ORAL | Status: DC
  Administered 2020-11-13 – 2020-11-15 (×4): 80 mg via ORAL
  Filled 2020-11-13 (×4): qty 2

## 2020-11-13 MED ORDER — DARBEPOETIN ALFA 200 MCG/0.4ML IJ SOSY
200.0000 ug | PREFILLED_SYRINGE | INTRAMUSCULAR | Status: DC
  Administered 2020-11-14 – 2020-11-21 (×2): 200 ug via INTRAVENOUS
  Filled 2020-11-13 (×4): qty 0.4

## 2020-11-13 NOTE — Progress Notes (Signed)
Patient ID: Raymond Hurley., male   DOB: [birth year], 87 y.o.   MRN: [id number]   SW provided pt with a ramp and home spec sheet as pt reported concerns about space in his room and being able to maneuver more freely.   Pt wife reports she would like pt to be able to d/c with using a RW.   Loralee Pacas, MSW, [place] Office: [phone] Cell: [phone] Fax: [phone]

## 2020-11-13 NOTE — Progress Notes (Signed)
Inpatient Rehabilitation  Patient information reviewed and entered into eRehab system by Antiono Ettinger M. Eustacio Ellen, M.A., CCC/SLP, PPS Coordinator.  Information including medical coding, functional ability and quality indicators will be reviewed and updated through discharge.    

## 2020-11-13 NOTE — Progress Notes (Signed)
Inpatient Rehabilitation Care Coordinator Assessment and Plan Patient Details  Name: Raymond Hurley. MRN: [id number] Date of Birth: [birth year]  Today's Date: 11/13/2020  Hospital Problems: Principal Problem:   Debility Active Problems:   Steroid-induced hyperglycemia   Leucocytosis   Acute on chronic anemia   ESRD on dialysis [facility])  Past Medical History:  Past Medical History:  Diagnosis Date  . Atrial fibrillation ([place])   . CAD (coronary artery disease) 04/2019  . CHF (congestive heart failure) ([place])   . Melanoma ([place])   . Vitamin B 12 deficiency    Past Surgical History:  Past Surgical History:  Procedure Laterality Date  . CARDIOVERSION N/A 06/02/2019   Procedure: CARDIOVERSION;  Surgeon: Jerline Pain, MD;  Location: [facility] ENDOSCOPY;  Service: Cardiovascular;  Laterality: N/A;  . IR FLUORO GUIDE CV LINE RIGHT  11/06/2020  . IR US GUIDE VASC ACCESS RIGHT  11/06/2020  . RIGHT/LEFT HEART CATH AND CORONARY ANGIOGRAPHY N/A 04/27/2019   Procedure: RIGHT/LEFT HEART CATH AND CORONARY ANGIOGRAPHY;  Surgeon: Jolaine Artist, MD;  Location: [facility] CV LAB;  Service: Cardiovascular;  Laterality: N/A;  . TEE WITHOUT CARDIOVERSION N/A 06/02/2019   Procedure: TRANSESOPHAGEAL ECHOCARDIOGRAM (TEE);  Surgeon: Jerline Pain, MD;  Location: [facility] ENDOSCOPY;  Service: Cardiovascular;  Laterality: N/A;   Social History:  reports that he has never smoked. He has never used smokeless tobacco. He reports current alcohol use of about 7.0 standard drinks of alcohol per week. He reports that he does not use drugs.  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 Read: Yes Write: Yes Employment Status: Employed Date Retired/Disabled/Unemployed: Pt continues to assist his son with family business Age Retired: 71 (years) Public relations account executive Issues: Denies Guardian/Conservator: N/A   Abuse/Neglect Abuse/Neglect Assessment Can Be Completed: Yes Physical Abuse: Denies Verbal Abuse: Denies Sexual Abuse: Denies Exploitation of patient/patient's resources: Denies Self-Neglect: Denies  Emotional Status Pt's affect, behavior and adjustment status: Pt in good spirits at time of visit Recent Psychosocial Issues: Denies Psychiatric History: Denies Substance Abuse History: Pt admits to a few glasses of wine or beer at night.  Patient / Family Perceptions, Expectations & Goals Pt/Family understanding of illness & functional limitations: Pt and family have good understanding of care needs Premorbid pt/family roles/activities: Independent Anticipated changes in roles/activities/participation: Assistance with ADLs/IADLs Pt/family expectations/goals: To be as independent as possible  Recruitment consultant: None Premorbid Home Care/DME Agencies: None Transportation available at discharge: wife Resource referrals recommended: Neuropsychology  Discharge Planning Living Arrangements: Spouse/significant other Support Systems: Children,Spouse/significant other Type of Residence: Private residence Insurance underwriter Resources: [employer] (specify) Nurse, mental health) Financial Resources: Other (Comment),Employment Financial Screen Referred: No Money Management: Spouse,Family Does the patient have any problems obtaining your medications?: No Home Management: Pt has a part time maid that helps with housekeeping. Wife prepare alot of their  meals Patient/Family Preliminary Plans: No changes Care Coordinator Anticipated Follow Up Needs: HH/OP Expected length of stay: 18-22  Clinical Impression SW met with pt in room to introduce self, explain role, and discus discharge process. 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 DME: access to  w/c that was his mother in laws before she passed. Pt called wife while SW in room and SW to follow-up with updates after team conference.  Pt is ESRD MWF.   SW updated pt Renal SW Terri Piedra on patient admission and will follow-up once more updates on d/c date. Reports she will continue to work on finding pt a dialysis location closet towards discharge.   Brylin Stanislawski A Dravin Lance 11/13/2020, 3:06 PM

## 2020-11-13 NOTE — Progress Notes (Signed)
Occupational Therapy Session Note  Patient Details  Name: Raymond Hurley. MRN: [id number] Date of Birth: 1931/01/29  Today's Date: 11/13/2020 OT Individual Time: 4627-0350 OT Individual Time Calculation (min): 54 min    Short Term Goals: Week 1:  OT Short Term Goal 1 (Week 1): Pt iwll transfer to toilet with MIN A consistently OT Short Term Goal 2 (Week 1): Pt will thread BLE into pants wiht AE PRN OT Short Term Goal 3 (Week 1): PT will complete 1/3 steps of toileting OT Short Term Goal 4 (Week 1): pt will stand to groom to demo improved activity tolernace  Skilled Therapeutic Interventions/Progress Updates:    Treatment session with focus on self-care retraining and functional mobility.  Pt received upright in bed finishing breakfast. Pt reports feeling dizzy with transfers and toileting over the weekend. Pt agreeable to bathing and dressing, however requested to utilize [facility] for all transfers and sit > stand this session due to dizziness and weakness over the weekend.  Pt completed bed mobility with min-mod assist requiring assistance at trunk to come to sitting at EOB.  Engaged in sit > stand from EOB with min assist when pulling up in [place] from elevated EOB.  Engaged in [place] bathing and dressing and grooming seated at sink with setup for items.  Pt requiring increased time due to fatigue and generalized weakness. Completed LB dressing max assist with pt able to pull pants over knees but then when standing in [place] pants dropped to floor requiring assistance from therapist to pull up.  Therapist directed pt to utilize mirror for upright standing.  Pt reports increased dizziness.  Returned to sitting. Pt remained upright in w/c with seat belt alarm on and all needs in reach.  Pt reports dizziness when standing.  BP 2-3 mins after standing 93/52.  Therapist applied TEDS and educated on increased upright tolerance for improved BP.    Therapy Documentation Precautions:   Precautions Precautions: Fall,Other (comment) Precaution Comments: Bowel incontinence (apparently had flexiseal removed yesterday) Restrictions Weight Bearing Restrictions: No General:   Vital Signs: Therapy Vitals Temp: 98 F (36.7 C) Temp Source: Oral Pulse Rate: 69 Resp: 18 BP: (!) 117/56 Patient Position (if appropriate): Lying Oxygen Therapy SpO2: 98 % O2 Device: Room Air Pain:  Pt with no c/o pain   Therapy/Group: Individual Therapy  [name] 11/13/2020, 8:18 AM

## 2020-11-13 NOTE — Progress Notes (Signed)
[facility] Progress Note     Assessment/ Plan:   1. Dialysis dependent AKI 2/2 ATN. 02/2020 Cr 1.2. 1. CRRT 1/4-1/12. And now tol iHD- last 1/29 2. Patient/family desire ongoing dialysis and full care 3. IR  tunneled dialysis catheter, on 1/24 4. Began clip process as AKI, SW aware - going to [place] MWF 5. Tolerating HD better after addition of midodrine, last on 1/29 though UF a bit lower.  Will  maximize fluid removal with HD given persistent LE edema.  Midodrine prior.  Profile 2, low temp.  Next HD will be on 2/1.    2. Acute pancreatitis: Noted on CT scan 10/16/20 and again on 1/7. 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   3. Acute hypoxemic RF: likely aspiration PNA +/- COVID pneumonitis, now on RA. S/p speroids.  Resolved and off isolation now.    4. Shock: high WBC count, w/u per primary team. Possible ilacus abscess on CT from 1/7. Off levophed 1/11. Completed courese of Mero, zyvox. Slowly improving but WBC remains 32k.       5Acute encephalopathy: multifactorial, likely COVID + acute illness. Improved.   6.  Chronic systolic CHF: EF 0/9/81 on TTE 60-65% with no WMA.  Now that making urine I will challenge him with some moderate dose lasix   7.  Afib: hep off with GIB; amiodarone po   8. Acute anemia: GI losses + AKI/CKD. Received multiple blood transfusion.  He has a history of metastatic melanoma.  On 1/22, Dr. Carolin Sicks discussed with multiple family members via video call in the patient's room.  The family discussed with the patient's oncologist at [facility] who reportedly said no contraindication for erythropoietin to manage anemia. Being treated with IV iron and ESA .  Running in mid 7s, transfuse < 7.   9.  Thrombocytopenia:  Initially normal, into 50s now back to 120s.  No heparin with dialysis.  10. Bones-  pth 217-  Phos down to 6.2 with dialysis-  Also tums ordered    Subjective:    Moved to CIR. Feeling good today.  Said he made some urine !!!    Objective:   BP (!) 108/59 (BP Location: Left Arm)   Pulse 77   Temp (!) 97.5 F (36.4 C) (Oral)   Resp 18   Ht 5\' 10"  (1.778 m)   Wt 93 kg   SpO2 95%   BMI 29.42 kg/m   Intake/Output Summary (Last 24 hours) at 11/13/2020 1257 Last data filed at 11/13/2020 0900 Gross per 24 hour  Intake 440 ml  Output -  Net 440 ml   Weight change: 0.6 kg  Physical Exam: GEN: Not in distress, comfortable on RA NECK: Supple, no thyromegaly LUNGS: Clear bilateral, no rhonchi CV: RRR, No M/R/G ABD: SNDNT +BS  EXT: 2+  extremity edema with some improvement over the week but still significant ACCESS: RIJ TDC site clean  Imaging: No results found.  Labs: BMET Recent Labs  Lab 11/07/20 0200 11/09/20 0344 11/11/20 0622 11/13/20 0600  NA 133* 136 138 138  K 4.2 4.9 4.3 4.1  CL 97* 100 102 98  CO2 16* 16* 18* 21*  GLUCOSE 117* 74 97 116*  BUN 135* 109* 103* 82*  CREATININE 8.52* 7.50* 7.35* 6.63*  CALCIUM 7.8* 8.3* 8.4* 8.5*  PHOS  --  9.4* 7.9* 6.2*   CBC Recent Labs  Lab 11/08/20 0223 11/09/20 0344 11/11/20 1914  11/13/20 0600  WBC 42.3* 38.6* 32.1* 28.4*  NEUTROABS  --  27.0*  --   --   HGB 7.4* 7.6* 7.3* 7.4*  HCT 22.0* 24.1* 21.2* 22.4*  MCV 87.6 93.1 88.7 91.4  PLT 92* 100* 123* 129*    Medications:    . acetaminophen (TYLENOL) oral liquid 160 mg/5 mL  650 mg Oral Q6H  . amiodarone  400 mg Oral BID  . [START ON 11/14/2020] B-complex with vitamin C  1 tablet Oral Daily  . Chlorhexidine Gluconate Cloth  6 each Topical Q0600  . feeding supplement (NEPRO CARB STEADY)  237 mL Oral TID WC  . Gerhardt's butt cream   Topical TID  . insulin aspart  3-9 Units Subcutaneous Q4H  . lactobacillus acidophilus  2 tablet Oral TID  . lidocaine  1 patch Transdermal Q24H  . mouth rinse  15 mL Mouth Rinse BID  . midodrine  10 mg Oral Q M,W,F-HD  . [START ON 11/14/2020] predniSONE  40 mg Oral Q breakfast  .  senna-docusate  2 tablet Oral QHS  . sucralfate  1 g Oral TID WC & HS  . triamcinolone 0.1 % cream : eucerin   Topical BID    Louis Meckel, MD  11/13/2020, 12:57 PM

## 2020-11-13 NOTE — Progress Notes (Signed)
Patient ID: Raymond Hurley., male   DOB: 24-Jun-1931, 87 y.o.   MRN: [id number]  This NP reviewed medical records,  discussed case with team.   Patient is currently in CIR and working well with therapies. He is  HD dependent, frail and with multiple co-morbidites, high risk to decompensate.  I met at patient's bedside, he is alert and oriented.  He is pleasant and friendly.  Auria/LCSW is with patient at this time  Created space and opportunity for patient  to explore his thoughts and feeling regarding his current medical situation.   Patient is optimistic for continued improvement,  He is looking forward to several weeks in rehab for continued improvement.    Education offered on the importance of mobility.    Emotional support offered.     Questions and concerns addressed   I will continue to follow up with [name] intermittently during his rehab stay. I encouraged him to call will questions or concerns.   Total time spent on the unit was 15 minutes  Greater than 50% of the time was spent in counseling and coordination of care  Wadie Lessen NP  Palliative Medicine Team Team Phone # [phone] Pager [phone]

## 2020-11-13 NOTE — Care Management (Addendum)
[place] Individual Statement of Services  Patient Name:  Raymond Hurley.  Date:  11/13/2020  Welcome to the [facility].  Our goal is to provide you with an individualized program based on your diagnosis and situation, designed to meet your specific needs.  With this comprehensive rehabilitation program, you will be expected to participate in at least 3 hours of rehabilitation therapies Monday-Friday, with modified therapy programming on the weekends.  Your rehabilitation program will include the following services:  Physical Therapy (PT), Occupational Therapy (OT), 24 hour per day rehabilitation nursing, Therapeutic Recreaction (TR), Psychology, Neuropsychology, Care Coordinator, Rehabilitation Medicine, Nutrition Services, Pharmacy Services and Other  Weekly team conferences will be held on Wednesdays to discuss your progress.  Your Inpatient Rehabilitation Care Coordinator will talk with you frequently to get your input and to update you on team discussions.  Team conferences with you and your family in attendance may also be held.  Expected length of stay: 18-22 days  Overall anticipated outcome: Supervision  Depending on your progress and recovery, your program may change. Your Inpatient Rehabilitation Care Coordinator will coordinate services and will keep you informed of any changes. Your Inpatient Rehabilitation Care Coordinator's name and contact numbers are listed  below.  The following services may also be recommended but are not provided by the [facility] will be made to provide these services after discharge if needed.  Arrangements include referral to agencies that provide these services.  Your insurance has been verified to be:  Medicare A/B  Your primary doctor is:   Radio broadcast assistant  Pertinent information will be shared with your doctor and your insurance company.  Inpatient Rehabilitation Care Coordinator:  Cathleen Corti [phone] or ([phone]  Information discussed with and copy given to patient by: Rana Snare, 11/13/2020, 10:30 AM

## 2020-11-13 NOTE — Progress Notes (Signed)
Physical Therapy Session Note  Patient Details  Name: Raymond Hurley. MRN: [id number] Date of Birth: [birth year]  Today's Date: 11/13/2020 PT Individual Time: 2482-5003 PT Individual Time Calculation (min): 68 min   Short Term Goals: Week 1:  PT Short Term Goal 1 (Week 1): Pt will trasnfer to WC with CGA and LRAD PT Short Term Goal 2 (Week 1): Pt will performed bed mobility with CGA PT Short Term Goal 3 (Week 1): Pt will ambulate 78ft with min assist and LRAD PT Short Term Goal 4 (Week 1): Pt will propell WC 136ft with supervision assist PT Short Term Goal 5 (Week 1): pt will tolerate sitting up in WC >2 hours between therapies  Skilled Therapeutic Interventions/Progress Updates:   Received pt sitting in WC, pt agreeable to therapy, and denied any pain during session. Session with emphasis on functional mobility/transfers, generalized strengthening, dynamic standing balance/coordination, and improved activity tolerance. Pt reported not sleeping well last night due to bed being uncomfortable. Discussed wound on pt's bottom and pt reported it is difficult for him to roll side to side himself due to "heaviness" of R leg. Discussed ordering air mattress for pt with RN. Pt also reported having low BP this morning and feeling dizzy. BP sitting in WC: 114/57 and pt asymptomatic. Pt transported to therapy gym in [facility] total A for energy conservation purposes and transferred sit<>stand with RW and mod A x 2 trials with cues for hand placement on RW and WC armrests. BP in standing 105/59, O2 sat 99%, and HR 98 bpm. Pt only able to remain standing ~30 seconds prior to requesting to sit due to increased dizziness. Pt with significant SOB after standing requiring multiple extended rest breaks throughout session. Pt transferred stand<>pivot WC<>mat with RW and mod A. Pt performed the following exercises sitting on mat with supervision and verbal cues for technique: -LAQ x10 bilaterally -hip flexion x10  bilaterally (increased difficulty and decreased ROM on RLE) -hip adduction ball squeezes 2x10 Pt reported mat felt too hard on his bottom and transferred sit<>stand with RW and heavy max A from low surface and therapist placed Roho cushion underneath pt's buttocks. Pt's feet not touching the floor and pt reported feeling "woozy" and agreed to transfer back to [facility] for safety. Stand<>pivot mat<>WC with RW and mod A. Discussed home setup and assist available at home. Pt reported his wife is unable to provide assist and he needs to be as independent as possible upon D/C. Pt reported they have a WC that belonged to his wife's mother but is unable to recall if he has a RW. Educated pt on weekly team conference meeting with plan to set estimated D/C date on Wednesday. Pt transported back to room in [facility] total A and requested to stay sitting in [place]. Therapist placed pillow behind pt's back for comfort. Concluded session with pt sitting in WC, needs within reach, and seatbelt alarm on.   Therapy Documentation Precautions:  Precautions Precautions: Fall,Other (comment) Precaution Comments: Bowel incontinence (apparently had flexiseal removed yesterday) Restrictions Weight Bearing Restrictions: No  Therapy/Group: Individual Therapy Alfonse Alpers PT, DPT   11/13/2020, 7:26 AM

## 2020-11-13 NOTE — Progress Notes (Signed)
[place] PHYSICAL MEDICINE & REHABILITATION PROGRESS NOTE   Subjective/Complaints: Patient seen sitting up in his chair working with therapy this morning.  He states he slept well overnight.  He states he had a good weekend.  Discussed blood pressure with patient and therapies.  He was seen by nephrology yesterday, notes reviewed-no changes.  Patient states he has not had a bowel movement in 2 days, but does not want to change meds at present due to concern for diarrhea.  ROS: + Constipation.  CP, SOB, N/V/D  Objective:   No results found. Recent Labs    11/11/20 0622 11/13/20 0600  WBC 32.1* 28.4*  HGB 7.3* 7.4*  HCT 21.2* 22.4*  PLT 123* 129*   Recent Labs    11/11/20 0622 11/13/20 0600  NA 138 138  K 4.3 4.1  CL 102 98  CO2 18* 21*  GLUCOSE 97 116*  BUN 103* 82*  CREATININE 7.35* 6.63*  CALCIUM 8.4* 8.5*    Intake/Output Summary (Last 24 hours) at 11/13/2020 1216 Last data filed at 11/13/2020 0900 Gross per 24 hour  Intake 440 ml  Output --  Net 440 ml     Pressure Injury 11/10/20 Buttocks Right;Mid Unstageable - Full thickness tissue loss in which the base of the injury is covered by slough (yellow, tan, gray, green or brown) and/or eschar (tan, brown or black) in the wound bed. red open area of partial s (Active)  11/10/20 1721  Location: Buttocks  Location Orientation: Right;Mid  Staging: Unstageable - Full thickness tissue loss in which the base of the injury is covered by slough (yellow, tan, gray, green or brown) and/or eschar (tan, brown or black) in the wound bed.  Wound Description (Comments): red open area of partial skin loss with eschar covering wound bed  Present on Admission: Yes    Physical Exam: Vital Signs Blood pressure (!) 117/56, pulse 69, temperature 98 F (36.7 C), temperature source Oral, resp. rate 18, height 5\' 10"  (1.778 m), weight 93 kg, SpO2 98 %. Constitutional: No distress . Vital signs reviewed. HENT: Normocephalic.   Atraumatic. Eyes: EOMI. No discharge. Cardiovascular: No JVD.  RRR. Respiratory: Normal effort.  No stridor.  Bilateral clear to auscultation. GI: + Distended.  BS +. Skin: Warm and dry.  Intact. Psych: Normal mood.  Normal behavior. Musc:  Bilateral lower extremity edema  No tenderness in extremities. Neuro: Alert Motor: Bilateral upper extremities: 4+/5 throughout Left lower extremity: Hip flexion, knee extension 4/5, ankle dorsiflexion 4+/5 Right lower extremity: Hip flexion, knee extension 3+/5, ankle dorsiflexion 4+/5   Assessment/Plan: 1. Functional deficits which require 3+ hours per day of interdisciplinary therapy in a comprehensive inpatient rehab setting.  Physiatrist is providing close team supervision and 24 hour management of active medical problems listed below.  Physiatrist and rehab team continue to assess barriers to discharge/monitor patient progress toward functional and medical goals  Care Tool:  Bathing    Body parts bathed by patient: Right arm,Left arm,Chest,Abdomen,Face   Body parts bathed by helper: Buttocks,Right lower leg,Left lower leg     Bathing assist Assist Level: Supervision/Verbal cueing     Upper Body Dressing/Undressing Upper body dressing   What is the patient wearing?: Pull over shirt    Upper body assist Assist Level: Minimal Assistance - Patient > 75%    Lower Body Dressing/Undressing Lower body dressing      What is the patient wearing?: Pants     Lower body assist Assist for lower body dressing: Total  Assistance - Patient < 25%     Toileting Toileting    Toileting assist Assist for toileting: Maximal Assistance - Patient 25 - 49%     Transfers Chair/bed transfer  Transfers assist     Chair/bed transfer assist level: Moderate Assistance - Patient 50 - 74% (RW)     Locomotion Ambulation   Ambulation assist   Ambulation activity did not occur: Safety/medical concerns          Walk 10 feet  activity   Assist  Walk 10 feet activity did not occur: Safety/medical concerns        Walk 50 feet activity   Assist Walk 50 feet with 2 turns activity did not occur: Safety/medical concerns         Walk 150 feet activity   Assist Walk 150 feet activity did not occur: Safety/medical concerns         Walk 10 feet on uneven surface  activity   Assist Walk 10 feet on uneven surfaces activity did not occur: Safety/medical concerns         Wheelchair     Assist Will patient use wheelchair at discharge?: Yes Type of Wheelchair: Manual    Wheelchair assist level: Supervision/Verbal cueing Max wheelchair distance: 100    Wheelchair 50 feet with 2 turns activity    Assist        Assist Level: Supervision/Verbal cueing   Wheelchair 150 feet activity     Assist      Assist Level: Minimal Assistance - Patient > 75%   Blood pressure (!) 117/56, pulse 69, temperature 98 F (36.7 C), temperature source Oral, resp. rate 18, height 5\' 10"  (1.778 m), weight 93 kg, SpO2 98 %.    Medical Problem List and Plan: 1.  Generalized weakness affecting ability to stand or complete ADL tasks secondary to debility.  Continue CIR  2.  Antithrombotics: -DVT/anticoagulation:  Mechanical: Sequential compression devices, below knee Bilateral lower extremities due to GI bleed.             -antiplatelet therapy: N/A 3. Pain Management: Tylenol prn.  4. Mood: LCSW to follow for evaluation and support.              -antipsychotic agents: N/a 5. Neuropsych: This patient is capable of making decisions on his own behalf. 6. Skin/Wound Care: Routine pressure relief measures.  7. Fluids/Electrolytes/Nutrition: Strict I/Os.  Labs with HD.discussed 1211ml fluid restriction  8.  ESRD: On midodrine prior to HD to help support BP.  9. Chronic systolic CHF/CAF: Strict I/Os. Fluid status managed with HD. On Amiodarone 400 mg bid for rate control.              Daily  weights Filed Weights   11/11/20 1559 11/12/20 0418 11/13/20 0436  Weight: 90.4 kg 91.6 kg 93 kg   ?  Trending up on 1/31   Monitor heart rate with increased activity 10.  Pancreatitis: Has completed antibiotic course. Tolerating diet without nausea, diarrhea or abdominal pain.              Monitor closely. 11. LGIB: On Carafate ac/hs.  12. Acute on chronic anemia: On aranresp weekly.              Labs with HD  Hemoglobin 7.4 on 1/31 13. Thrombocytopenia: Monitor for signs of bleeding.              Labs with HD  Platelets 129 on 1/31 14. Acute respiratory failure: Has resolved.  prednisone 40 mg daily  15.  Leukocytosis-some steroid effect  WBCs 28.4 on 1/31, continue to monitor  Afebrile 16.  Steroid-induced hyperglycemia  SSI  Continue to monitor  LOS: 3 days A FACE TO FACE EVALUATION WAS PERFORMED  Jenae Tomasello Lorie Phenix 11/13/2020, 12:16 PM

## 2020-11-14 DIAGNOSIS — K859 Acute pancreatitis without necrosis or infection, unspecified: Secondary | ICD-10-CM

## 2020-11-14 DIAGNOSIS — D72829 Elevated white blood cell count, unspecified: Secondary | ICD-10-CM

## 2020-11-14 LAB — RENAL FUNCTION PANEL
Albumin: 2.5 g/dL — ABNORMAL LOW (ref 3.5–5.0)
Anion gap: 21 — ABNORMAL HIGH (ref 5–15)
BUN: 107 mg/dL — ABNORMAL HIGH (ref 8–23)
CO2: 18 mmol/L — ABNORMAL LOW (ref 22–32)
Calcium: 8.8 mg/dL — ABNORMAL LOW (ref 8.9–10.3)
Chloride: 97 mmol/L — ABNORMAL LOW (ref 98–111)
Creatinine, Ser: 7.91 mg/dL — ABNORMAL HIGH (ref 0.61–1.24)
GFR, Estimated: 6 mL/min — ABNORMAL LOW (ref >60)
Glucose, Bld: 143 mg/dL — ABNORMAL HIGH (ref 70–99)
Phosphorus: 6.7 mg/dL — ABNORMAL HIGH (ref 2.5–4.6)
Potassium: 4.7 mmol/L (ref 3.5–5.1)
Sodium: 136 mmol/L (ref 135–145)

## 2020-11-14 LAB — GLUCOSE, CAPILLARY
Glucose-Capillary: 117 mg/dL — ABNORMAL HIGH (ref 70–99)
Glucose-Capillary: 129 mg/dL — ABNORMAL HIGH (ref 70–99)
Glucose-Capillary: 73 mg/dL (ref 70–99)
Glucose-Capillary: 86 mg/dL (ref 70–99)

## 2020-11-14 LAB — CBC
HCT: 23.1 % — ABNORMAL LOW (ref 39.0–52.0)
Hemoglobin: 7.2 g/dL — ABNORMAL LOW (ref 13.0–17.0)
MCH: 29.4 pg (ref 26.0–34.0)
MCHC: 31.2 g/dL (ref 30.0–36.0)
MCV: 94.3 fL (ref 80.0–100.0)
Platelets: 163 10*3/uL (ref 150–400)
RBC: 2.45 MIL/uL — ABNORMAL LOW (ref 4.22–5.81)
RDW: 23.2 % — ABNORMAL HIGH (ref 11.5–15.5)
WBC: 28.9 10*3/uL — ABNORMAL HIGH (ref 4.0–10.5)
nRBC: 0.1 % (ref 0.0–0.2)

## 2020-11-14 MED ORDER — SODIUM CHLORIDE 0.9 % IV SOLN: 100.0000 mL | INTRAVENOUS | Status: DC | PRN

## 2020-11-14 MED ORDER — PENTAFLUOROPROP-TETRAFLUOROETH EX AERO: 1.0000 "application " | INHALATION_SPRAY | CUTANEOUS | Status: DC | PRN

## 2020-11-14 MED ORDER — LIDOCAINE HCL (PF) 1 % IJ SOLN: 5.0000 mL | INTRAMUSCULAR | Status: DC | PRN

## 2020-11-14 MED ORDER — MIDODRINE HCL 5 MG PO TABS
10.0000 mg | ORAL_TABLET | Freq: Three times a day (TID) | ORAL | Status: DC
  Administered 2020-11-14 – 2020-12-07 (×63): 10 mg via ORAL
  Filled 2020-11-14 (×64): qty 2

## 2020-11-14 MED ORDER — ALBUMIN HUMAN 25 % IV SOLN
INTRAVENOUS | Status: AC
  Filled 2020-11-14: qty 50

## 2020-11-14 MED ORDER — DARBEPOETIN ALFA 200 MCG/0.4ML IJ SOSY
PREFILLED_SYRINGE | INTRAMUSCULAR | Status: AC
  Filled 2020-11-14: qty 0.4

## 2020-11-14 MED ORDER — LIDOCAINE-PRILOCAINE 2.5-2.5 % EX CREA: 1.0000 "application " | TOPICAL_CREAM | CUTANEOUS | Status: DC | PRN

## 2020-11-14 MED ORDER — HEPARIN SODIUM (PORCINE) 1000 UNIT/ML IJ SOLN
INTRAMUSCULAR | Status: AC
  Administered 2020-11-14: 1000 [IU]
  Filled 2020-11-14: qty 4

## 2020-11-14 MED ORDER — ALTEPLASE 2 MG IJ SOLR: 2.0000 mg | Freq: Once | INTRAMUSCULAR | Status: DC | PRN

## 2020-11-14 MED ORDER — ALBUMIN HUMAN 25 % IV SOLN
INTRAVENOUS | Status: AC
  Administered 2020-11-14: 25 g
  Filled 2020-11-14: qty 50

## 2020-11-14 MED ORDER — INSULIN ASPART 100 UNIT/ML ~~LOC~~ SOLN
3.0000 [IU] | Freq: Three times a day (TID) | SUBCUTANEOUS | Status: DC
  Administered 2020-11-15 – 2020-11-27 (×9): 3 [IU] via SUBCUTANEOUS

## 2020-11-14 MED ORDER — HEPARIN SODIUM (PORCINE) 1000 UNIT/ML DIALYSIS: 1000.0000 [IU] | INTRAMUSCULAR | Status: DC | PRN

## 2020-11-14 NOTE — Progress Notes (Signed)
[place] PHYSICAL MEDICINE & REHABILITATION PROGRESS NOTE   Subjective/Complaints: Patient seen sitting up in bed this morning.  He states he slept well overnight.  He states he feels like he is going to have a bowel movement soon.  He has questions regarding?  "Kidney medications".  Received by nephrology yesterday, notes reviewed-started on Lasix.  He was seen by palliative care yesterday, appreciate consultation.  ROS: Denies CP, SOB, N/V/D  Objective:   No results found. Recent Labs    11/13/20 0600  WBC 28.4*  HGB 7.4*  HCT 22.4*  PLT 129*   Recent Labs    11/13/20 0600  NA 138  K 4.1  CL 98  CO2 21*  GLUCOSE 116*  BUN 82*  CREATININE 6.63*  CALCIUM 8.5*    Intake/Output Summary (Last 24 hours) at 11/14/2020 9509 Last data filed at 11/13/2020 1300 Gross per 24 hour  Intake 120 ml  Output --  Net 120 ml     Pressure Injury 11/10/20 Buttocks Right;Mid Unstageable - Full thickness tissue loss in which the base of the injury is covered by slough (yellow, tan, gray, green or brown) and/or eschar (tan, brown or black) in the wound bed. red open area of partial s (Active)  11/10/20 1721  Location: Buttocks  Location Orientation: Right;Mid  Staging: Unstageable - Full thickness tissue loss in which the base of the injury is covered by slough (yellow, tan, gray, green or brown) and/or eschar (tan, brown or black) in the wound bed.  Wound Description (Comments): red open area of partial skin loss with eschar covering wound bed  Present on Admission: Yes    Physical Exam: Vital Signs Blood pressure (!) 112/55, pulse 71, temperature (!) 97.4 F (36.3 C), resp. rate 18, height 5\' 10"  (1.778 m), weight 91.4 kg, SpO2 98 %.  Constitutional: No distress . Vital signs reviewed. HENT: Normocephalic.  Atraumatic. Eyes: EOMI. No discharge. Cardiovascular: No JVD.   Respiratory: Normal effort.  No stridor.   GI: +Distended. BS+. Skin: Warm and dry.  Intact. Psych: Normal  mood.  Normal behavior. Musc:  Bilateral lower extremity edema No tenderness in extremities. Neuro: Alert Motor: Bilateral upper extremities: 4+/5 throughout, unchanged Left lower extremity: Hip flexion, knee extension 4-/5, ankle dorsiflexion 4+/5 Right lower extremity: Hip flexion, knee extension 3-/5, ankle dorsiflexion 4+/5   Assessment/Plan: 1. Functional deficits which require 3+ hours per day of interdisciplinary therapy in a comprehensive inpatient rehab setting.  Physiatrist is providing close team supervision and 24 hour management of active medical problems listed below.  Physiatrist and rehab team continue to assess barriers to discharge/monitor patient progress toward functional and medical goals  Care Tool:  Bathing    Body parts bathed by patient: Right arm,Left arm,Chest,Abdomen,Face   Body parts bathed by helper: Buttocks,Right lower leg,Left lower leg     Bathing assist Assist Level: Supervision/Verbal cueing     Upper Body Dressing/Undressing Upper body dressing   What is the patient wearing?: Pull over shirt    Upper body assist Assist Level: Minimal Assistance - Patient > 75%    Lower Body Dressing/Undressing Lower body dressing      What is the patient wearing?: Pants     Lower body assist Assist for lower body dressing: Total Assistance - Patient < 25%     Toileting Toileting    Toileting assist Assist for toileting: Maximal Assistance - Patient 25 - 49%     Transfers Chair/bed transfer  Transfers assist  Chair/bed transfer assist level: Moderate Assistance - Patient 50 - 74% (RW)     Locomotion Ambulation   Ambulation assist   Ambulation activity did not occur: Safety/medical concerns          Walk 10 feet activity   Assist  Walk 10 feet activity did not occur: Safety/medical concerns        Walk 50 feet activity   Assist Walk 50 feet with 2 turns activity did not occur: Safety/medical concerns          Walk 150 feet activity   Assist Walk 150 feet activity did not occur: Safety/medical concerns         Walk 10 feet on uneven surface  activity   Assist Walk 10 feet on uneven surfaces activity did not occur: Safety/medical concerns         Wheelchair     Assist Will patient use wheelchair at discharge?: Yes Type of Wheelchair: Manual    Wheelchair assist level: Supervision/Verbal cueing Max wheelchair distance: 100    Wheelchair 50 feet with 2 turns activity    Assist        Assist Level: Supervision/Verbal cueing   Wheelchair 150 feet activity     Assist      Assist Level: Minimal Assistance - Patient > 75%   Blood pressure (!) 112/55, pulse 71, temperature (!) 97.4 F (36.3 C), resp. rate 18, height 5\' 10"  (1.778 m), weight 91.4 kg, SpO2 98 %.    Medical Problem List and Plan: 1.  Generalized weakness affecting ability to stand or complete ADL tasks secondary to debility.  Continue CIR  2.  Antithrombotics: -DVT/anticoagulation:  Mechanical: Sequential compression devices, below knee Bilateral lower extremities due to GI bleed.             -antiplatelet therapy: N/A 3. Pain Management: Tylenol prn.  4. Mood: LCSW to follow for evaluation and support.              -antipsychotic agents: N/a 5. Neuropsych: This patient is capable of making decisions on his own behalf. 6. Skin/Wound Care: Routine pressure relief measures.  7. Fluids/Electrolytes/Nutrition: Strict I/Os.  Labs with HD.discussed 121ml fluid restriction  8. ESRD: On midodrine prior to HD to help support BP.  9. Chronic systolic CHF/CAF: Strict I/Os. Fluid status managed with HD. On Amiodarone 400 mg bid for rate control.              Daily weights  Lasix per Nephro Filed Weights   11/12/20 0418 11/13/20 0436 11/14/20 0427  Weight: 91.6 kg 93 kg 91.4 kg   Appears stable on 2/1  Monitor heart rate with increased activity 10.  Pancreatitis: Has completed antibiotic course.  Tolerating diet without nausea, diarrhea or abdominal pain.              Monitor closely.  Tolerating diet at present 11. LGIB: On Carafate ac/hs.  12. Acute on chronic anemia: On aranresp weekly.              Labs with HD  Hemoglobin 7.4 on 1/31 13. Thrombocytopenia: Monitor for signs of bleeding.              Labs with HD  Platelets 129 on 1/31 14. Acute respiratory failure: Has resolved. prednisone 40 mg daily  15.  Leukocytosis-some steroid effect  WBCs 28.4 on 1/31, continue to monitor  Afebrile 16.  Steroid-induced hyperglycemia  SSI  Labile on 2/1, continue to monitor  Continue to monitor  LOS: 4 days A FACE TO FACE EVALUATION WAS PERFORMED  Ridhi Hoffert Lorie Phenix 11/14/2020, 9:27 AM

## 2020-11-14 NOTE — Procedures (Signed)
Patient was seen on dialysis and the procedure was supervised.  BFR 350  Via TDC BP is  100/52.   Patient appears to be tolerating treatment well  Louis Meckel 11/14/2020

## 2020-11-14 NOTE — Progress Notes (Signed)
Physical Therapy Session Note  Patient Details  Name: Raymond Hurley. MRN: [id number] Date of Birth: 11-Jun-1931  Today's Date: 11/14/2020 PT Individual Time: 1025-1136 PT Individual Time Calculation (min): 71 min   Short Term Goals: Week 1:  PT Short Term Goal 1 (Week 1): Pt will trasnfer to WC with CGA and LRAD PT Short Term Goal 2 (Week 1): Pt will performed bed mobility with CGA PT Short Term Goal 3 (Week 1): Pt will ambulate 43ft with min assist and LRAD PT Short Term Goal 4 (Week 1): Pt will propell WC 158ft with supervision assist PT Short Term Goal 5 (Week 1): pt will tolerate sitting up in WC >2 hours between therapies  Skilled Therapeutic Interventions/Progress Updates:    Pt received sitting in w/c and agreeable to therapy session though reports having a "good workout" during OT this AM.  Transported to/from gym in w/c for time management and energy conservation. Seated vitals for baseline: BP 109/48 (MAP 65), HR 79bpm. Sit<>stands in // bars x2 with min assist for lifting - uses UEs to pull into standing. Standing in // bars marching in place with CGA/min assist for balance and therapist guarding R knee but no unsteadiness noted. Reassessed vitals: BP 109/52 (MAP 69), HR 88bpm, SpO2 100% Pt requires prolonged therapeutic rest breaks between activity due to impaired activity tolerance. Gait training ~10ft in // bars with min assist of 1 and +2 w/c follow - heavy reliance on B UE support on bars, guarding R knee but only 1x slight flexion towards end with fatigue. Sit>stand w/c>RW min assist for lifting to stand - cuing for increased glute activation for trunk upright as coming to stand. Gait training overground using RW 3x with min assist of 1 and +2 w/c follow - 10steps, 12steps, and 15steps reaching approximately 48ft progressed to 55ft - guarding R knee for safety but no instability noted - continues to rely heavily on B UE support on RW, slow gait speed but achieves reciprocal  stepping pattern. 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 Pt reports increasing fatigue but agreeable to continue therapy session.  Performed the following seated B LE exercises: - long arc quads against 4lb ankle weight 2x15 reps - ankle PF against level 1 theraband resistance x20reps Transported back to room and had pt participate in incentive spirometry for improved respiratory function x10reps. Pt left seated in w/c with needs in reach and seat belt alarm on.  Therapy Documentation Precautions:  Precautions Precautions: Fall,Other (comment) Precaution Comments: Bowel incontinence (apparently had flexiseal removed yesterday) Restrictions Weight Bearing Restrictions: No  Pain: No reports of pain throughout session.  Therapy/Group: Individual Therapy  Tawana Scale , PT, DPT, CSRS  11/14/2020, 7:55 AM

## 2020-11-14 NOTE — Progress Notes (Signed)
Physical Therapy Session Note  Patient Details  Name: Raymond Hurley. MRN: [id number] Date of Birth: 1931-04-15  Today's Date: 11/14/2020 PT Missed Time: 60 Minutes Missed Time Reason: Other (Comment) (off unit for dialysis)  Skilled Therapeutic Interventions/Progress Updates:   Pt off unit for dialysis due to mix up with scheduling yesterday. Will attempt to make up time as able. 60 minutes missed of skilled physical therapy due to pt being off unit for dialysis.   Therapy Documentation Precautions:  Precautions Precautions: Fall,Other (comment) Precaution Comments: Bowel incontinence (apparently had flexiseal removed yesterday) Restrictions Weight Bearing Restrictions: No  Therapy/Group: Individual Therapy Alfonse Alpers PT, DPT   11/14/2020, 7:46 AM

## 2020-11-14 NOTE — Progress Notes (Signed)
Occupational Therapy Session Note  Patient Details  Name: Raymond Hurley. MRN: [id number] Date of Birth: 09/01/1931  Today's Date: 11/14/2020 OT Individual Time: 3762-8315 OT Individual Time Calculation (min): 57 min    Short Term Goals: Week 1:  OT Short Term Goal 1 (Week 1): Pt iwll transfer to toilet with MIN A consistently OT Short Term Goal 2 (Week 1): Pt will thread BLE into pants wiht AE PRN OT Short Term Goal 3 (Week 1): PT will complete 1/3 steps of toileting OT Short Term Goal 4 (Week 1): pt will stand to groom to demo improved activity tolernace  Skilled Therapeutic Interventions/Progress Updates:    Treatment session with focus on functional transfers, self-care retraining, and sit > stand.  Pt received on BSC over toilet.  Pt reports having BM and voiding a little bit of urine!  Attempted to stand pt in [place], unable to budge due to increased time on toilet.  Required +2 for sit > stand with use of [place] from [facility].  Pt stood 10-20 seconds while therapist completed hygiene.  Pt reports mild lightheadedness requesting to sit.  1-2 minute rest break and then pt able to stand again in [place] with only min assist of one.  Therapist completed hygiene and pulled up clean brief while pt maintained standing in [place] with min assist.  Engaged in UB bathing and grooming tasks seated at sink with setup.  Pt required prolonged rest break before willing to stand again to pull up pants.  Pt declined attempts to stand with RW this session due to challenge getting off toilet.  Pt completed sit > stand with mod assist in to [facility] to allow therapist to pull pants over hips.  Pt remained upright in w/c with seat belt alarm on and all needs in reach.  Therapy Documentation Precautions:  Precautions Precautions: Fall,Other (comment) Precaution Comments: Bowel incontinence (apparently had flexiseal removed yesterday) Restrictions Weight Bearing Restrictions: No Pain: Pain Assessment Pain Scale:  0-10 Pain Score: 0-No pain   Therapy/Group: Individual Therapy  [name] 11/14/2020, 10:07 AM

## 2020-11-14 NOTE — Progress Notes (Signed)
[facility] Progress Note     Assessment/ Plan:   1. Dialysis dependent AKI 2/2 ATN. 02/2020 Cr 1.2. 1. CRRT 1/4-1/12. And now tol iHD- last 1/29 2. Patient/family desire ongoing dialysis and full care-  Is ESRD 3. IR  tunneled dialysis catheter, on 1/24 4. Began clip process as AKI, SW aware - going to [place] MWF 5. Tolerating HD better after addition of midodrine, last on 1/29 though UF a bit lower.  Will  maximize fluid removal with HD given persistent LE edema.    Profile 2, low temp.  Next HD should be on Thursday   2. Acute pancreatitis: Noted on CT scan 10/16/20 and again on 1/7. 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   3. Acute hypoxemic RF: likely aspiration PNA +/- COVID pneumonitis, now on RA. S/p speroids.  Resolved and off isolation now.    4. Shock: high WBC count, w/u per primary team. Possible ilacus abscess on CT from 1/7. Off levophed 1/11. Completed courese of Mero, zyvox. Slowly improving but WBC remains high        5.  Chronic systolic CHF: EF 06/16/22 on TTE 60-65% with no WMA.  Now that making urine I will challenge him with some moderate dose lasix-  Still quite volume overloaded    6.  Afib: hep off with GIB; amiodarone po   8. Acute anemia: GI losses + AKI/CKD. Received multiple blood transfusion.  He has a history of metastatic melanoma.  On 1/22, Dr. Carolin Sicks discussed with multiple family members via video call in the patient's room.  The family discussed with the patient's oncologist at [facility] who reportedly said no contraindication for erythropoietin to manage anemia. Being treated with IV iron and ESA .  Running in mid 7s, transfuse < 7.     9. Bones-  pth 217-  Phos down to 6.2 with dialysis-  Also tums ordered    Subjective:   Seen in HD -  Low BP at present[facility] and also eliminated some fluid     Objective:   BP (!)  81/42   Pulse 72   Temp 97.7 F (36.5 C) (Oral)   Resp 18   Ht 5\' 10"  (1.778 m)   Wt 91 kg   SpO2 97%   BMI 28.79 kg/m   Intake/Output Summary (Last 24 hours) at 11/14/2020 1500 Last data filed at 11/14/2020 5573 Gross per 24 hour  Intake 118 ml  Output -  Net 118 ml   Weight change: -1.6 kg  Physical Exam: GEN: Not in distress, comfortable on RA NECK: Supple, no thyromegaly LUNGS: Clear bilateral, no rhonchi CV: RRR, No M/R/G ABD: SNDNT +BS  EXT: 2+  extremity edema with some improvement over the week but still significant ACCESS: RIJ TDC site clean  Imaging: No results found.  Labs: BMET Recent Labs  Lab 11/09/20 0344 11/11/20 0622 11/13/20 0600 11/14/20 1323  NA 136 138 138 136  K 4.9 4.3 4.1 4.7  CL 100 102 98 97*  CO2 16* 18* 21* 18*  GLUCOSE 74 97 116* 143*  BUN 109* 103* 82* 107*  CREATININE 7.50* 7.35* 6.63* 7.91*  CALCIUM 8.3* 8.4* 8.5* 8.8*  PHOS 9.4* 7.9* 6.2* 6.7*   CBC Recent Labs  Lab 11/09/20 0344 11/11/20 0622 11/13/20 0600 11/14/20 1323  WBC 38.6* 32.1* 28.4* 28.9*  NEUTROABS 27.0*  --   --   --  HGB 7.6* 7.3* 7.4* 7.2*  HCT 24.1* 21.2* 22.4* 23.1*  MCV 93.1 88.7 91.4 94.3  PLT 100* 123* 129* 163    Medications:    . acetaminophen (TYLENOL) oral liquid 160 mg/5 mL  650 mg Oral Q6H  . amiodarone  400 mg Oral BID  . B-complex with vitamin C  1 tablet Oral Daily  . Chlorhexidine Gluconate Cloth  6 each Topical Q0600  . Darbepoetin Alfa      . darbepoetin (ARANESP) injection - DIALYSIS  200 mcg Intravenous Q Tue-HD  . feeding supplement (NEPRO CARB STEADY)  237 mL Oral TID WC  . furosemide  80 mg Oral BID  . Gerhardt's butt cream   Topical TID  . heparin sodium (porcine)      . insulin aspart  3-9 Units Subcutaneous TID WC  . lactobacillus acidophilus  2 tablet Oral TID  . lidocaine  1 patch Transdermal Q24H  . mouth rinse  15 mL Mouth Rinse BID  . midodrine  10 mg Oral Q M,W,F-HD  . predniSONE  40 mg Oral Q breakfast  .  senna-docusate  2 tablet Oral QHS  . sucralfate  1 g Oral TID WC & HS  . triamcinolone 0.1 % cream : eucerin   Topical BID    Louis Meckel, MD  11/14/2020, 3:00 PM

## 2020-11-15 LAB — GLUCOSE, CAPILLARY
Glucose-Capillary: 110 mg/dL — ABNORMAL HIGH (ref 70–99)
Glucose-Capillary: 116 mg/dL — ABNORMAL HIGH (ref 70–99)
Glucose-Capillary: 123 mg/dL — ABNORMAL HIGH (ref 70–99)
Glucose-Capillary: 131 mg/dL — ABNORMAL HIGH (ref 70–99)
Glucose-Capillary: 162 mg/dL — ABNORMAL HIGH (ref 70–99)
Glucose-Capillary: 87 mg/dL (ref 70–99)

## 2020-11-15 MED ORDER — CHLORHEXIDINE GLUCONATE CLOTH 2 % EX PADS
6.0000 | MEDICATED_PAD | Freq: Every day | CUTANEOUS | Status: DC
  Administered 2020-11-18 – 2020-11-26 (×5): 6 via TOPICAL

## 2020-11-15 NOTE — Patient Care Conference (Signed)
Inpatient RehabilitationTeam Conference and Plan of Care Update Date: 11/15/2020   Time: 11:59 AM    Patient Name: Raymond Hurley.      Medical Record Number: [id number]  Date of Birth: 27-Aug-1931 Sex: Male         Room/Bed: 4W25C/4W25C-01 Payor Info: Payor: MEDICARE / Plan: MEDICARE PART A AND B / Product Type: *No Product type* /    Admit Date/Time:  11/10/2020  3:28 PM  Primary Diagnosis:  [facility] Problems: Principal Problem:   Debility Active Problems:   Steroid-induced hyperglycemia   Leucocytosis   Acute on chronic anemia   ESRD on dialysis [facility])   Palliative care by specialist   Acute pancreatitis    Expected Discharge Date: Expected Discharge Date: 12/07/20  Team Members Present: Physician leading conference: Dr. Delice Lesch Care Coodinator Present: Dorien Chihuahua, RN, BSN, CRRN;Loralee Pacas, [place] Nurse Present: Rayne Du, LPN PT Present: Becky Sax, PT OT Present: [name], OT PPS Coordinator present : Gunnar Fusi, Novella Olive, PT     Current Status/Progress Goal Weekly Team Focus  Bowel/Bladder   Anuric, Continent of bowel, LBM 11/14/2020  Remain continent  Assess Q shift and PRN   Swallow/Nutrition/ Hydration             ADL's   mod to +2 sit > stand depending on height of surface and fatigue; total assist toileting and LB dressing; Supervision/setup UB bathing, dressing, and grooming  Supervision overall  ADL retraining, sit > stand, stand pivot transfers, dynamic standing balance, activity tolerance/endurance   Mobility   bed mobility min A, transfers with RW mod A, gait 41ft in // bars with min A  supervision assist transfers and bed mobility, CGA gait, min A steps  functional mobility/transfers, generalized strengthening, dynamic standing balance/coordination, ambulation, and endurance   Communication             Safety/Cognition/ Behavioral Observations            Pain   No c/o pain  Pain <3  Assess Qshift and  PRN   Skin   Unstageable pressure injury on right mid buttocks.  Prevent further breakdown and infection  Assess Q shift and PRN     Discharge Planning:  D/c to home with 24/7 assistance from his wife and children PRN. Wife would like pt to be able to d/c using a RW if possible at d/c.   Team Discussion: Progress limited by dizziness,fatigue; requires extended rest breaks, poor endurance. Wound on buttocks, incontinent of bowel and anuric with HD.  Patient on target to meet rehab goals: Currently transfers mod assist able to ambulate with min assist and requires mod assist for transfers with supervision goals set for discharge; CGA for gait.  *See Care Plan and progress notes for long and short-term goals.   Revisions to Treatment Plan:   Teaching Needs: Transfers, toileting, wound/skin care, medications, etc  Current Barriers to Discharge: Decreased caregiver support, Wound care and Hemodialysis  Possible Resolutions to Barriers: Home with wife; limited support. Sons manage business     Medical Summary Current Status: Generalized weakness affecting ability to stand or complete ADL tasks secondary to debility.  Barriers to Discharge: Medical stability;Hemodialysis;Decreased family/caregiver support;Weight;Wound care   Possible Resolutions to Barriers/Weekly Focus: Therapies, wean steroids, follow labs - Hb, Plts, follow weights   Continued Need for Acute Rehabilitation Level of Care: The patient requires daily medical management by a physician with specialized training in physical medicine and rehabilitation for the  following reasons: Direction of a multidisciplinary physical rehabilitation program to maximize functional independence : Yes Medical management of patient stability for increased activity during participation in an intensive rehabilitation regime.: Yes Analysis of laboratory values and/or radiology reports with any subsequent need for medication adjustment and/or  medical intervention. : Yes   I attest that I was present, lead the team conference, and concur with the assessment and plan of the team.   Dorien Chihuahua B 11/15/2020, 1:00 PM

## 2020-11-15 NOTE — Progress Notes (Addendum)
[place] PHYSICAL MEDICINE & REHABILITATION PROGRESS NOTE   Subjective/Complaints: Patient seen sitting at the sink this morning.  He states he slept well overnight.  He notes he had a good bowel movement this morning. He was seen by Nephro yesterday, notes reviewed - lasix ordered.  ROS: Denies CP, SOB, N/V/D  Objective:   No results found. Recent Labs    11/13/20 0600 11/14/20 1323  WBC 28.4* 28.9*  HGB 7.4* 7.2*  HCT 22.4* 23.1*  PLT 129* 163   Recent Labs    11/13/20 0600 11/14/20 1323  NA 138 136  K 4.1 4.7  CL 98 97*  CO2 21* 18*  GLUCOSE 116* 143*  BUN 82* 107*  CREATININE 6.63* 7.91*  CALCIUM 8.5* 8.8*    Intake/Output Summary (Last 24 hours) at 11/15/2020 1102 Last data filed at 11/15/2020 0700 Gross per 24 hour  Intake 200 ml  Output 2200 ml  Net -2000 ml     Pressure Injury 11/10/20 Buttocks Right;Mid Unstageable - Full thickness tissue loss in which the base of the injury is covered by slough (yellow, tan, gray, green or brown) and/or eschar (tan, brown or black) in the wound bed. red open area of partial s (Active)  11/10/20 1721  Location: Buttocks  Location Orientation: Right;Mid  Staging: Unstageable - Full thickness tissue loss in which the base of the injury is covered by slough (yellow, tan, gray, green or brown) and/or eschar (tan, brown or black) in the wound bed.  Wound Description (Comments): red open area of partial skin loss with eschar covering wound bed  Present on Admission: Yes    Physical Exam: Vital Signs Blood pressure (!) 115/58, pulse 70, temperature 98 F (36.7 C), temperature source Oral, resp. rate 18, height 5\' 10"  (1.778 m), weight 119.7 kg, SpO2 97 %.  Constitutional: No distress . Vital signs reviewed. HENT: Normocephalic.  Atraumatic. Eyes: EOMI. No discharge. Cardiovascular: No JVD.  RRR. Respiratory: Normal effort.  No stridor.  Bilateral clear to auscultation. GI: +Distended. BS +. Skin: Warm and dry.  Sacral  ulcer not examined today.Marland Kitchen Psych: Normal mood.  Normal behavior. Musc: B/l LE edema, no tenderness Neuro: Alert Motor: Bilateral upper extremities: 4+/5 throughout, stable Left lower extremity: Hip flexion, knee extension 4-/5, ankle dorsiflexion 4+/5 Right lower extremity: Hip flexion, knee extension 3-/5, ankle dorsiflexion 4+/5   Assessment/Plan: 1. Functional deficits which require 3+ hours per day of interdisciplinary therapy in a comprehensive inpatient rehab setting.  Physiatrist is providing close team supervision and 24 hour management of active medical problems listed below.  Physiatrist and rehab team continue to assess barriers to discharge/monitor patient progress toward functional and medical goals  Care Tool:  Bathing    Body parts bathed by patient: Right arm,Left arm,Chest,Abdomen,Face   Body parts bathed by helper: Buttocks,Right lower leg,Left lower leg     Bathing assist Assist Level: Supervision/Verbal cueing     Upper Body Dressing/Undressing Upper body dressing   What is the patient wearing?: Pull over shirt    Upper body assist Assist Level: Minimal Assistance - Patient > 75%    Lower Body Dressing/Undressing Lower body dressing      What is the patient wearing?: Pants     Lower body assist Assist for lower body dressing: Total Assistance - Patient < 25%     Toileting Toileting    Toileting assist Assist for toileting: 2 Helpers     Transfers Chair/bed transfer  Transfers assist     Chair/bed transfer  assist level: Moderate Assistance - Patient 50 - 74% (RW)     Locomotion Ambulation   Ambulation assist   Ambulation activity did not occur: Safety/medical concerns  Assist level: 2 helpers Assistive device: Walker-rolling Max distance: 57ft   Walk 10 feet activity   Assist  Walk 10 feet activity did not occur: Safety/medical concerns  Assist level: 2 helpers Assistive device: Walker-rolling   Walk 50 feet  activity   Assist Walk 50 feet with 2 turns activity did not occur: Safety/medical concerns         Walk 150 feet activity   Assist Walk 150 feet activity did not occur: Safety/medical concerns         Walk 10 feet on uneven surface  activity   Assist Walk 10 feet on uneven surfaces activity did not occur: Safety/medical concerns         Wheelchair     Assist Will patient use wheelchair at discharge?: Yes Type of Wheelchair: Manual    Wheelchair assist level: Supervision/Verbal cueing Max wheelchair distance: 100    Wheelchair 50 feet with 2 turns activity    Assist        Assist Level: Supervision/Verbal cueing   Wheelchair 150 feet activity     Assist      Assist Level: Minimal Assistance - Patient > 75%     Medical Problem List and Plan: 1.  Generalized weakness affecting ability to stand or complete ADL tasks secondary to debility.  Continue CIR   Team conference today to discuss current and goals and coordination of care, home and environmental barriers, and discharge planning with nursing, case manager, and therapies. Please see conference note from today as well.  2.  Antithrombotics: -DVT/anticoagulation:  Mechanical: Sequential compression devices, below knee Bilateral lower extremities due to GI bleed.             -antiplatelet therapy: N/A 3. Pain Management: Tylenol prn.  4. Mood: LCSW to follow for evaluation and support.              -antipsychotic agents: N/a 5. Neuropsych: This patient is capable of making decisions on his own behalf. 6. Skin/Wound Care: Routine pressure relief measures.  7. Fluids/Electrolytes/Nutrition: Strict I/Os.  Labs with HD.discussed 1262ml fluid restriction  8. ESRD: On midodrine prior to HD to help support BP.  9. Chronic systolic CHF/CAF: Strict I/Os. Fluid status managed with HD. On Amiodarone 400 mg bid for rate control.              Daily weights  Lasix per Nephro Filed Weights   11/14/20  1253 11/14/20 1610 11/15/20 0401  Weight: 91 kg 88.7 kg 119.7 kg   Inaccurate on 2/2  Monitor heart rate with increased activity 10.  Pancreatitis: Has completed antibiotic course. Tolerating diet without nausea, diarrhea or abdominal pain.              Monitor closely.  Tolerating diet at present 11. LGIB: On Carafate ac/hs.  12. Acute on chronic anemia: On aranresp weekly.              Labs with HD  Hemoglobin 7.2 on 2/1 13. Thrombocytopenia: Monitor for signs of bleeding.              Labs with HD  Platelets 163 on 2/1 14. Acute respiratory failure: Has resolved. prednisone 40 mg daily  15.  Leukocytosis-some steroid effect  WBCs 28.9 on 2/1, continue to monitor  Afebrile 16.  Steroid-induced hyperglycemia  SSI  Labile on 2/2, continue to monitor  Plan to wean  Continue to monitor  LOS: 5 days A FACE TO FACE EVALUATION WAS PERFORMED  Adam Sanjuan Lorie Phenix 11/15/2020, 11:02 AM

## 2020-11-15 NOTE — Progress Notes (Signed)
Occupational Therapy Session Note  Patient Details  Name: Raymond Hurley. MRN: [id number] Date of Birth: August 10, 1931  Today's Date: 11/15/2020 OT Individual Time: 0354-6568 OT Individual Time Calculation (min): 60 min    Short Term Goals: Week 1:  OT Short Term Goal 1 (Week 1): Pt iwll transfer to toilet with MIN A consistently OT Short Term Goal 2 (Week 1): Pt will thread BLE into pants wiht AE PRN OT Short Term Goal 3 (Week 1): PT will complete 1/3 steps of toileting OT Short Term Goal 4 (Week 1): pt will stand to groom to demo improved activity tolernace  Skilled Therapeutic Interventions/Progress Updates:    Treatment session with focus on self-care retraining and functional mobility and endurance.  Pt received upright in w/c having just returned from bathroom with nurse tech.  Pt reports fatigue post toileting, requesting a few mins to recuperate.  Pt engaged in grooming and UB bathing seated at sink with setup.  Pt able to don shirt with setup assist.  Pt agreeable after seated break during self-care tasks to attempt standing.  Engaged in sit > stand x3 with mod assist fading to min assist x1.  +2 present for safety, but not required during any sit > stand this session.  Pt reports dizziness in standing, tolerated standing 7, 15, and 18 seconds before requiring seated break.  BP assessed initially 101/57 and HR 88, after stand BP 97/75.  Pt requires 2-3 min rest break between each stand to recuperate.  Pt remained upright in w/c with seat belt alarm on and all needs in reach.  Therapy Documentation Precautions:  Precautions Precautions: Fall,Other (comment) Precaution Comments: Bowel incontinence (apparently had flexiseal removed yesterday) Restrictions Weight Bearing Restrictions: No Pain:  Pt with no c/o pain   Therapy/Group: Individual Therapy  [name] 11/15/2020, 12:06 PM

## 2020-11-15 NOTE — Progress Notes (Signed)
Patient ID: Raymond Hurley., male   DOB: Mar 25, 1931, 87 y.o.   MRN: [id number]  This SW covering for assigned SW, Becky Dupree.   SW met with pt in room and pt called his wife while SW in room. SW provided updates from team conference, and d/c date 2/24. Pt wife would like for pt to be able to leave walking. Both understand pt will continue to be evaluated each week.   Loralee Pacas, MSW, [place] Office: [phone] Cell: [phone] Fax: [phone]

## 2020-11-15 NOTE — Progress Notes (Signed)
[facility] Progress Note     Assessment/ Plan:   1. Dialysis dependent AKI 2/2 ATN. 02/2020 Cr 1.2. 1. CRRT 1/4-1/12. And now tol iHD- last 2/1 2. Patient/family desire ongoing dialysis and full care-  Is ESRD 3. IR  tunneled dialysis catheter, on 1/24-  Not sure if want to do perm access at this time given debility 4. Began clip process as AKI, SW aware - going to [place] MWF 5. Tolerating HD better after addition of midodrine.  Will  Try to maximize fluid removal with HD given persistent LE edema.    Profile 2, low temp.  Next HD should be on Thursday 2/3   2. Acute pancreatitis: Noted on CT scan 10/16/20 and again on 1/7. 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   3. Acute hypoxemic RF: likely aspiration PNA +/- COVID pneumonitis, now on RA. S/p speroids.  Resolved and off isolation now.    4. Shock: high WBC count, w/u per primary team. Possible ilacus abscess on CT from 1/7. Off levophed 1/11. Completed courese of Mero, zyvox. Slowly improving but WBC remains high        5.  Chronic systolic CHF: EF 0/2/54 on TTE 60-65% with no WMA.  Now that making urine I will challenge him with some moderate dose lasix- not really making a difference it seems, will stop -  Still quite volume overloaded    6.  Afib: hep off with GIB; amiodarone po   8. Acute anemia: GI losses + AKI/CKD. Received multiple blood transfusion.  He has a history of metastatic melanoma.  On 1/22, Dr. Carolin Sicks discussed with multiple family members via video call in the patient's room.  The family discussed with the patient's oncologist at [facility] who reportedly said no contraindication for erythropoietin to manage anemia. Being treated with IV iron and ESA .  Running in mid 7s, transfuse < 7.     9. Bones-  pth 217-  Phos down to 6.2 with dialysis-  Also tums ordered    Subjective:    Had HD yesterday - removed 2200-  Tolerated pretty  well - pre HD crt 7.9 so most clearly not recovering   Objective:   BP (!) 115/58 (BP Location: Left Arm)   Pulse 70   Temp 98 F (36.7 C) (Oral)   Resp 18   Ht 5\' 10"  (1.778 m)   Wt 119.7 kg Comment: bed weight -new bed  SpO2 97%   BMI 37.88 kg/m   Intake/Output Summary (Last 24 hours) at 11/15/2020 1225 Last data filed at 11/15/2020 0700 Gross per 24 hour  Intake 200 ml  Output 2200 ml  Net -2000 ml   Weight change: -0.4 kg  Physical Exam: GEN: Not in distress, comfortable on RA NECK: Supple, no thyromegaly LUNGS: Clear bilateral, no rhonchi CV: RRR, No M/R/G ABD: SNDNT +BS  EXT: 2+  extremity edema with some improvement over the week but still significant ACCESS: RIJ TDC site clean  Imaging: No results found.  Labs: BMET Recent Labs  Lab 11/09/20 0344 11/11/20 0622 11/13/20 0600 11/14/20 1323  NA 136 138 138 136  K 4.9 4.3 4.1 4.7  CL 100 102 98 97*  CO2 16* 18* 21* 18*  GLUCOSE 74 97 116* 143*  BUN 109* 103* 82* 107*  CREATININE 7.50* 7.35* 6.63* 7.91*  CALCIUM 8.3* 8.4* 8.5* 8.8*  PHOS 9.4* 7.9* 6.2* 6.7*   CBC Recent  Labs  Lab 11/09/20 0344 11/11/20 0622 11/13/20 0600 11/14/20 1323  WBC 38.6* 32.1* 28.4* 28.9*  NEUTROABS 27.0*  --   --   --   HGB 7.6* 7.3* 7.4* 7.2*  HCT 24.1* 21.2* 22.4* 23.1*  MCV 93.1 88.7 91.4 94.3  PLT 100* 123* 129* 163    Medications:    . acetaminophen (TYLENOL) oral liquid 160 mg/5 mL  650 mg Oral Q6H  . amiodarone  400 mg Oral BID  . B-complex with vitamin C  1 tablet Oral Daily  . Chlorhexidine Gluconate Cloth  6 each Topical Q0600  . darbepoetin (ARANESP) injection - DIALYSIS  200 mcg Intravenous Q Tue-HD  . feeding supplement (NEPRO CARB STEADY)  237 mL Oral TID WC  . furosemide  80 mg Oral BID  . Gerhardt's butt cream   Topical TID  . insulin aspart  3-9 Units Subcutaneous TID WC  . lactobacillus acidophilus  2 tablet Oral TID  . lidocaine  1 patch Transdermal Q24H  . mouth rinse  15 mL Mouth Rinse BID   . midodrine  10 mg Oral TID WC  . predniSONE  40 mg Oral Q breakfast  . senna-docusate  2 tablet Oral QHS  . sucralfate  1 g Oral TID WC & HS  . triamcinolone 0.1 % cream : eucerin   Topical BID    Louis Meckel, MD  11/15/2020, 12:25 PM

## 2020-11-15 NOTE — Progress Notes (Signed)
Physical Therapy Session Note  Patient Details  Name: Raymond Hurley. MRN: [id number] Date of Birth: 10/05/1931  Today's Date: 11/15/2020 PT Individual Time: 1000-1057 PT Individual Time Calculation (min): 57 min   Short Term Goals: Week 1:  PT Short Term Goal 1 (Week 1): Pt will trasnfer to WC with CGA and LRAD PT Short Term Goal 2 (Week 1): Pt will performed bed mobility with CGA PT Short Term Goal 3 (Week 1): Pt will ambulate 38ft with min assist and LRAD PT Short Term Goal 4 (Week 1): Pt will propell WC 135ft with supervision assist PT Short Term Goal 5 (Week 1): pt will tolerate sitting up in WC >2 hours between therapies  Skilled Therapeutic Interventions/Progress Updates:   Received pt sitting in WC, pt agreeable to therapy, and denied any pain during session. Pt reported liking the air mattress to sleep on but reported it was extremely uncomfortable yesterday going to dialysis because it goes flat when it's not plugged in. Session with emphasis on functional mobility/transfers, generalized strengthening, dynamic standing balance/coordination, ambulation, and improved activity tolerance. Pt reported feeling "shaky". BP in sitting 113/55. Donned jacket with min A and pt transported to dayroom in [facility] total A for time management purposes. Pt requested to "get warmed up" prior to walking and performed x10 bilateral hip flexion, x10 bilateral LAQ, x10 horizontal shoulder abduction with light orange TB. Pt transferred sit<>stand with RW and mod A with cues for hand placement and ambulated 61ft x1 and 75ft x 1 with RW and min A +2 for close WC follow. Pt reported increased dizziness and returned to sitting. BP 128/58, HR 84bpm, and O2 sat 99%. Pt required multiple extended rest breaks throughout session due to fatigue and poor activity tolerance. Pt transferred sit<>stand with RW and min A and performed alternating marches x3 reps bilaterally with bilateral UE support on RW and CGA/min A but  stopped due to reports of increased dizziness. BP: 113/60. Pt agreeable to seated exercises and performed the following exercises with supervision and verbal cues for technique: -hamstring curls with light orange TB 1x10 and 1x20 bilaterally -hip abduction with light orange TB 2x10  -horizontal chest press with 2lb dowel x20 -overhead chest press with 2lb dowel x20 Pt transported back to room in [facility] total A. Concluded session with pt sitting in WC, needs within reach, and seatbelt alarm on. Therapist provided fresh drink for pt.   Therapy Documentation Precautions:  Precautions Precautions: Fall,Other (comment) Precaution Comments: Bowel incontinence (apparently had flexiseal removed yesterday) Restrictions Weight Bearing Restrictions: No  Therapy/Group: Individual Therapy Alfonse Alpers PT, DPT   11/15/2020, 7:33 AM

## 2020-11-15 NOTE — Progress Notes (Signed)
Occupational Therapy Session Note  Patient Details  Name: Raymond Hurley. MRN: [id number] Date of Birth: [birth year]  Today's Date: 11/15/2020 OT Individual Time: 1100-1200 OT Individual Time Calculation (min): 60 min    Short Term Goals: Week 1:  OT Short Term Goal 1 (Week 1): Pt iwll transfer to toilet with MIN A consistently OT Short Term Goal 2 (Week 1): Pt will thread BLE into pants wiht AE PRN OT Short Term Goal 3 (Week 1): PT will complete 1/3 steps of toileting OT Short Term Goal 4 (Week 1): pt will stand to groom to demo improved activity tolernace  Skilled Therapeutic Interventions/Progress Updates:    Pt received in wc and ready for therapy. Pt seen this session to work on general endurance, UE strength using light weight dowel for rows, bicep curls, overhead reaches and AROM with arm/ sh circles and punches working for approximately 1 min at a time with 30 sec to 1 min rest break.  Educated pt on PRE scale of 1-10 and to aim for a level 5 with his work outs. Pt did well with this and stopped when he got a little breathless.   Pt then worked on sit to stand from [employer] with high cushion to [employer]. He used 2 hands on wc to push up and then transferred one hand at a time to walker. Pt able to rise to stand 3x with only CGA!  His standing tolerance was about 10 sec each but he was able to march feet in place. Needed at least 1 min seated rest break in between.   Pt worked on a few more arm AROM exercises and then discussed his home set up and his concerns about the high bed and tub set up. Suggested his wife take photos on her phone to be able to show his main therapists. Suggested they consider a low profile boxspring.    Pt resting in w/c with all needs met.  Belt alarm on.        Therapy Documentation Precautions:  Precautions Precautions: Fall,Other (comment) Precaution Comments: Bowel incontinence (apparently had flexiseal removed yesterday) Restrictions Weight Bearing  Restrictions: No  Pain: Pain Assessment Pain Scale: 0-10 Pain Score: 0-No pain ADL: ADL Eating: Set up Where Assessed-Eating: Bed level Grooming: Supervision/safety Where Assessed-Grooming: Edge of bed Lower Body Bathing: Moderate assistance Where Assessed-Lower Body Bathing: Edge of bed Upper Body Dressing: Minimal assistance Where Assessed-Upper Body Dressing: Edge of bed Lower Body Dressing: Maximal assistance Where Assessed-Lower Body Dressing: Edge of bed   Therapy/Group: Individual Therapy  [place] 11/15/2020, 11:18 AM

## 2020-11-15 NOTE — Progress Notes (Signed)
Patient ID: Raymond Hurley., male   DOB: June 27, 1931, 87 y.o.   MRN: [id number] Met with the patient and wife to review role of the nurse CM  To address educational needs and collaboration with SW to facilitate preparation for discharge. Reviewed HF and recommendations for renal and low salt diet along with  Vit B 12 deficiency dietary modifications and cooking with less salt. Discussed HD at discharge and SW collaborating with HD coordinator to set up schedule and facility near the home. Continue to follow along to discharge to address educational needs, questions and concerns. [name]

## 2020-11-16 LAB — RENAL FUNCTION PANEL
Albumin: 2.9 g/dL — ABNORMAL LOW (ref 3.5–5.0)
Albumin: 3.3 g/dL — ABNORMAL LOW (ref 3.5–5.0)
Anion gap: 22 — ABNORMAL HIGH (ref 5–15)
Anion gap: 17 — ABNORMAL HIGH (ref 5–15)
BUN: 83 mg/dL — ABNORMAL HIGH (ref 8–23)
BUN: 42 mg/dL — ABNORMAL HIGH (ref 8–23)
CO2: 17 mmol/L — ABNORMAL LOW (ref 22–32)
CO2: 23 mmol/L (ref 22–32)
Calcium: 9.3 mg/dL (ref 8.9–10.3)
Calcium: 9 mg/dL (ref 8.9–10.3)
Chloride: 98 mmol/L (ref 98–111)
Chloride: 98 mmol/L (ref 98–111)
Creatinine, Ser: 7.06 mg/dL — ABNORMAL HIGH (ref 0.61–1.24)
Creatinine, Ser: 4.52 mg/dL — ABNORMAL HIGH (ref 0.61–1.24)
GFR, Estimated: 7 mL/min — ABNORMAL LOW (ref >60)
GFR, Estimated: 12 mL/min — ABNORMAL LOW (ref >60)
Glucose, Bld: 143 mg/dL — ABNORMAL HIGH (ref 70–99)
Glucose, Bld: 163 mg/dL — ABNORMAL HIGH (ref 70–99)
Phosphorus: 5.8 mg/dL — ABNORMAL HIGH (ref 2.5–4.6)
Phosphorus: 4.1 mg/dL (ref 2.5–4.6)
Potassium: 4.6 mmol/L (ref 3.5–5.1)
Potassium: 3.7 mmol/L (ref 3.5–5.1)
Sodium: 137 mmol/L (ref 135–145)
Sodium: 138 mmol/L (ref 135–145)

## 2020-11-16 LAB — CBC
HCT: 24 % — ABNORMAL LOW (ref 39.0–52.0)
HCT: 23 % — ABNORMAL LOW (ref 39.0–52.0)
Hemoglobin: 7.4 g/dL — ABNORMAL LOW (ref 13.0–17.0)
Hemoglobin: 7.5 g/dL — ABNORMAL LOW (ref 13.0–17.0)
MCH: 29.7 pg (ref 26.0–34.0)
MCH: 30.1 pg (ref 26.0–34.0)
MCHC: 30.8 g/dL (ref 30.0–36.0)
MCHC: 32.6 g/dL (ref 30.0–36.0)
MCV: 96.4 fL (ref 80.0–100.0)
MCV: 92.4 fL (ref 80.0–100.0)
Platelets: 202 10*3/uL (ref 150–400)
Platelets: 145 10*3/uL — ABNORMAL LOW (ref 150–400)
RBC: 2.49 MIL/uL — ABNORMAL LOW (ref 4.22–5.81)
RBC: 2.49 MIL/uL — ABNORMAL LOW (ref 4.22–5.81)
RDW: 24.2 % — ABNORMAL HIGH (ref 11.5–15.5)
RDW: 23.8 % — ABNORMAL HIGH (ref 11.5–15.5)
WBC: 26.1 10*3/uL — ABNORMAL HIGH (ref 4.0–10.5)
WBC: 24.2 10*3/uL — ABNORMAL HIGH (ref 4.0–10.5)
nRBC: 0.3 % — ABNORMAL HIGH (ref 0.0–0.2)
nRBC: 0.4 % — ABNORMAL HIGH (ref 0.0–0.2)

## 2020-11-16 LAB — GLUCOSE, CAPILLARY
Glucose-Capillary: 100 mg/dL — ABNORMAL HIGH (ref 70–99)
Glucose-Capillary: 101 mg/dL — ABNORMAL HIGH (ref 70–99)
Glucose-Capillary: 110 mg/dL — ABNORMAL HIGH (ref 70–99)
Glucose-Capillary: 124 mg/dL — ABNORMAL HIGH (ref 70–99)
Glucose-Capillary: 76 mg/dL (ref 70–99)

## 2020-11-16 MED ORDER — ALBUMIN HUMAN 25 % IV SOLN
INTRAVENOUS | Status: AC
  Administered 2020-11-16: 0.25 g
  Filled 2020-11-16: qty 100

## 2020-11-16 MED ORDER — PREDNISONE 20 MG PO TABS
20.0000 mg | ORAL_TABLET | Freq: Every day | ORAL | Status: DC
Start: 2020-11-17 — End: 2020-11-21
  Administered 2020-11-17 – 2020-11-21 (×5): 20 mg via ORAL
  Filled 2020-11-16 (×5): qty 1

## 2020-11-16 NOTE — Evaluation (Signed)
Recreational Therapy Assessment and Plan  Patient Details  Name: Raymond Hurley. MRN: [id number] Date of Birth: 23-Jun-1931 Today's Date: 11/16/2020  Rehab Potential:  good ELOS:   d/c 2/24  Assessment Hospital Problem: Principal Problem:   Debility   Past Medical History:      Past Medical History:  Diagnosis Date  . Atrial fibrillation ([place])   . CAD (coronary artery disease) 04/2019  . CHF (congestive heart failure) ([place])   . Melanoma ([place])   . Vitamin B 12 deficiency    Past Surgical History:       Past Surgical History:  Procedure Laterality Date  . CARDIOVERSION N/A 06/02/2019   Procedure: CARDIOVERSION;  Surgeon: Jerline Pain, MD;  Location: [facility] ENDOSCOPY;  Service: Cardiovascular;  Laterality: N/A;  . IR FLUORO GUIDE CV LINE RIGHT  11/06/2020  . IR US GUIDE VASC ACCESS RIGHT  11/06/2020  . RIGHT/LEFT HEART CATH AND CORONARY ANGIOGRAPHY N/A 04/27/2019   Procedure: RIGHT/LEFT HEART CATH AND CORONARY ANGIOGRAPHY;  Surgeon: Jolaine Artist, MD;  Location: [facility] CV LAB;  Service: Cardiovascular;  Laterality: N/A;  . TEE WITHOUT CARDIOVERSION N/A 06/02/2019   Procedure: TRANSESOPHAGEAL ECHOCARDIOGRAM (TEE);  Surgeon: Jerline Pain, MD;  Location: [facility] ENDOSCOPY;  Service: Cardiovascular;  Laterality: N/A;   Clinical Impression: Patient is a 87 year old male with history of melanoma, vitamin B12 deficiency, A. fib--on Eliquis who was admitted on 10/14/2020 with acute right facial droop, dysarthria, and difficulty following commands.History taken from chart review and patient.Family reported patient with recent diagnosis of Covid with diarrhea and poor p.o. intake. MRI/MRA head was unremarkable for acute intracranial process. He was found to have leukocytosis with elevated BNP as well as acute kidney injury and elevated D-dimer. He was treated with fluid protocol and IV antibiotics for septic shock. CT abdomen 01/07 showed multifocal pneumonia as well  as severe inflammatory changes around pancreas consistent with severe pancreatitis as well as 4.6 x 2.3 cm low-density in right iliac is muscle concerning for developing abscess. Dr. Alessandra Bevels consulted for input and pancreatitis treated with supportive care as well as course of meropenem and linezolid.  Acute renal failure felt to be due to to ATN with hypovolemia and hypotension. He required CVVHD and has been transitioned to hemodialysis. Acute hypoxic respiratory failure due to aspiration pneumonia treated with stress dose steroids with improvement with recommendations for slow prednisone taper. Amiodarone was added for rate control of atrial fibrillation with RVR per cardiology input. He has had issues with GIB due to hematochezia and required multiple transfusions and anticoagulation was discontinued. Thrombocytopenia being monitored. He completed antibiotic course on 10/31/2020 and follow-up CT abdomen 11/06/2020 showed severe acute pancreatitis with probable early pseudocyst formation as well as increased thickness in the right psoas muscle and no change in right iliac [place] muscle. Current recommendations are to monitor off antibiotics. Encephalopathy improving, he is tolerating regular diet and currently noted to be significantly debilitated with generalized weakness affecting ability to stand or complete ADL tasks. Patient transferred to CIR on 11/10/2020 .   Pt presents with decreased activity tolerance, decreased functional mobility, decreased balance Limiting pt's independence with leisure/community pursuits.  Plan  Min 1 TR session >20 minutes per week during LOS  Recommendations for other services: None   Discharge Criteria: Patient will be discharged from TR if patient refuses treatment 3 consecutive times without medical reason.  If treatment goals not met, if there is a change in medical status, if patient makes no  progress towards goals or if patient is discharged from  hospital.  The above assessment, treatment plan, treatment alternatives and goals were discussed and mutually agreed upon: by patient  Raymond Hurley 11/16/2020, 8:47 AM

## 2020-11-16 NOTE — Progress Notes (Signed)
[facility] PHYSICAL MEDICINE & REHABILITATION PROGRESS NOTE   Subjective/Complaints: Patient seen sitting up this morning, working with therapies.  He states she slept well overnight.  He states he was not able to void this AM.  He has questions regarding his ulcer-discussed pressure relief with patient and therapies.  He was seen by nephrology yesterday, notes reviewed-no change in Lasix, DC'd.  ROS: Denies CP, SOB, N/V/D  Objective:   No results found. Recent Labs    11/14/20 1323  WBC 28.9*  HGB 7.2*  HCT 23.1*  PLT 163   Recent Labs    11/14/20 1323  NA 136  K 4.7  CL 97*  CO2 18*  GLUCOSE 143*  BUN 107*  CREATININE 7.91*  CALCIUM 8.8*    Intake/Output Summary (Last 24 hours) at 11/16/2020 1214 Last data filed at 11/16/2020 7902 Gross per 24 hour  Intake 600 ml  Output --  Net 600 ml     Pressure Injury 11/10/20 Buttocks Right;Mid Unstageable - Full thickness tissue loss in which the base of the injury is covered by slough (yellow, tan, gray, green or brown) and/or eschar (tan, brown or black) in the wound bed. red open area of partial s (Active)  11/10/20 1721  Location: Buttocks  Location Orientation: Right;Mid  Staging: Unstageable - Full thickness tissue loss in which the base of the injury is covered by slough (yellow, tan, gray, green or brown) and/or eschar (tan, brown or black) in the wound bed.  Wound Description (Comments): red open area of partial skin loss with eschar covering wound bed  Present on Admission: Yes    Physical Exam: Vital Signs Blood pressure (!) 113/57, pulse 79, temperature 98.3 F (36.8 C), resp. rate 18, height 5\' 10"  (1.778 m), weight 119.7 kg, SpO2 98 %.  Constitutional: No distress . Vital signs reviewed. HENT: Normocephalic.  Atraumatic. Eyes: EOMI. No discharge. Cardiovascular: No JVD.  RRR. Respiratory: Normal effort.  No stridor.  Bilateral clear to auscultation. GI: BS +.  + Distended Skin: Warm and dry.    Unstageable right inner gluteal ulcer Psych: Normal mood.  Normal behavior. Musc: Bilateral LE edema, no tenderness Neuro: Alert Motor: Bilateral upper extremities: 4+/5 throughout, unchanged Left lower extremity: Hip flexion, knee extension 4-/5, ankle dorsiflexion 4+/5 Right lower extremity: Hip flexion, knee extension 3-/5, ankle dorsiflexion 4+/5   Assessment/Plan: 1. Functional deficits which require 3+ hours per day of interdisciplinary therapy in a comprehensive inpatient rehab setting.  Physiatrist is providing close team supervision and 24 hour management of active medical problems listed below.  Physiatrist and rehab team continue to assess barriers to discharge/monitor patient progress toward functional and medical goals  Care Tool:  Bathing    Body parts bathed by patient: Right arm,Left arm,Chest,Abdomen,Face   Body parts bathed by helper: Buttocks,Right lower leg,Left lower leg     Bathing assist Assist Level: Supervision/Verbal cueing     Upper Body Dressing/Undressing Upper body dressing   What is the patient wearing?: Pull over shirt    Upper body assist Assist Level: Supervision/Verbal cueing    Lower Body Dressing/Undressing Lower body dressing      What is the patient wearing?: Pants     Lower body assist Assist for lower body dressing: Total Assistance - Patient < 25%     Toileting Toileting    Toileting assist Assist for toileting: 2 Helpers     Transfers Chair/bed transfer  Transfers assist     Chair/bed transfer assist level: Moderate Assistance -  Patient 50 - 74% (RW)     Locomotion Ambulation   Ambulation assist   Ambulation activity did not occur: Safety/medical concerns  Assist level: 2 helpers Assistive device: Walker-rolling Max distance: 66ft   Walk 10 feet activity   Assist  Walk 10 feet activity did not occur: Safety/medical concerns  Assist level: 2 helpers Assistive device: Walker-rolling   Walk 50  feet activity   Assist Walk 50 feet with 2 turns activity did not occur: Safety/medical concerns         Walk 150 feet activity   Assist Walk 150 feet activity did not occur: Safety/medical concerns         Walk 10 feet on uneven surface  activity   Assist Walk 10 feet on uneven surfaces activity did not occur: Safety/medical concerns         Wheelchair     Assist Will patient use wheelchair at discharge?: Yes Type of Wheelchair: Manual    Wheelchair assist level: Supervision/Verbal cueing Max wheelchair distance: 100    Wheelchair 50 feet with 2 turns activity    Assist        Assist Level: Supervision/Verbal cueing   Wheelchair 150 feet activity     Assist      Assist Level: Minimal Assistance - Patient > 75%     Medical Problem List and Plan: 1.  Generalized weakness affecting ability to stand or complete ADL tasks secondary to debility.  Continue CIR  2.  Antithrombotics: -DVT/anticoagulation:  Mechanical: Sequential compression devices, below knee Bilateral lower extremities due to GI bleed.             -antiplatelet therapy: N/A 3. Pain Management: Tylenol prn.  4. Mood: LCSW to follow for evaluation and support.              -antipsychotic agents: N/a 5. Neuropsych: This patient is capable of making decisions on his own behalf. 6. Skin/Wound Care: Routine pressure relief measures.  7. Fluids/Electrolytes/Nutrition: Strict I/Os.  Labs with HD.discussed 1273ml fluid restriction  8. ESRD: On midodrine prior to HD to help support BP.  9. Chronic systolic CHF/CAF: Strict I/Os. Fluid status managed with HD. On Amiodarone 400 mg bid for rate control.              Daily weights  Lasix per Nephro, appreciate Recs Filed Weights   11/14/20 1253 11/14/20 1610 11/15/20 0401  Weight: 91 kg 88.7 kg 119.7 kg   Inaccurate on 2/2, no repeat weights  Monitor heart rate with increased activity 10.  Pancreatitis: Has completed antibiotic course.  Tolerating diet without nausea, diarrhea or abdominal pain.              Monitor closely.  Tolerating diet at present 11. LGIB: On Carafate ac/hs.  12. Acute on chronic anemia: On aranresp weekly.              Labs with HD  Hemoglobin 7.2 on 2/1 13. Thrombocytopenia: Monitor for signs of bleeding.              Labs with HD  Platelets 163 on 2/1 14. Acute respiratory failure: Has resolved. prednisone 40 mg daily  15.  Leukocytosis-some steroid effect  WBCs 20.9 on 2/1, continue to monitor  Afebrile 16.  Steroid-induced hyperglycemia  SSI  Labile on 2/2, continue to monitor  Prednisone decreased to 20 on 2/4  Continue to monitor  LOS: 6 days A FACE TO FACE EVALUATION WAS PERFORMED  Aliyha Fornes Lorie Phenix 11/16/2020,  12:14 PM

## 2020-11-16 NOTE — Progress Notes (Signed)
[facility] Progress Note     Assessment/ Plan:   1. Dialysis dependent AKI 2/2 ATN. 02/2020 Cr 1.2. 1. CRRT 1/4-1/12. And now tol iHD- last 2/1 2. Patient/family desire ongoing dialysis and full care-  Is ESRD 3. IR  tunneled dialysis catheter, on 1/24-  Not sure if want to do perm access at this time given debility 4. Began clip process as AKI, SW aware - going to [facility] MWF 5. Tolerating HD better after addition of midodrine.  Will  Try to maximize fluid removal with HD given persistent LE edema.  Next HD should be today Thursday 2/3   2. Acute pancreatitis: Noted on CT scan 10/16/20 and again on 1/7. 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   3. Acute hypoxemic RF: likely aspiration PNA +/- COVID pneumonitis, now on RA. S/p speroids.  Resolved and off isolation now.    4. Shock: high WBC count, w/u per primary team. Possible ilacus abscess on CT from 1/7. Off levophed 1/11. Completed courese of Mero, zyvox. Slowly improving but WBC remains high        5.  Chronic systolic CHF: EF 01/13/34 on TTE 60-65% with no WMA.   making urine I attempted to challenge him with some moderate dose lasix- not really making a difference , stopped -  Still quite volume overloaded    6.  Afib: hep off with GIB; amiodarone po   8. Acute anemia: GI losses + AKI/CKD. Received multiple blood transfusion.  He has a history of metastatic melanoma.  On 1/22, Dr. Carolin Sicks discussed with multiple family members via video call in the patient's room.  The family discussed with the patient's oncologist at [facility] who reportedly said no contraindication for erythropoietin to manage anemia.  treated with IV iron and now on ESA .  Running in mid 7s, transfuse < 7.     9. Bones-  pth 217-  Phos down to 6.2 with dialysis-  Also tums ordered    Subjective:    Asking some of the same questions, pleasant-  Due for dialysis later today     Objective:   BP (!) 113/57   Pulse 79   Temp 98.3 F (36.8 C)   Resp 18   Ht 5\' 10"  (1.778 m)   Wt 119.7 kg Comment: bed weight -new bed  SpO2 98%   BMI 37.88 kg/m   Intake/Output Summary (Last 24 hours) at 11/16/2020 1214 Last data filed at 11/16/2020 3614 Gross per 24 hour  Intake 600 ml  Output -  Net 600 ml   Weight change:   Physical Exam: GEN: Not in distress, comfortable on RA NECK: Supple, no thyromegaly LUNGS: Clear bilateral, no rhonchi CV: RRR, No M/R/G ABD: SNDNT +BS  EXT: 2+  extremity edema with some improvement over the week but still significant ACCESS: RIJ TDC site clean  Imaging: No results found.  Labs: BMET Recent Labs  Lab 11/11/20 0622 11/13/20 0600 11/14/20 1323  NA 138 138 136  K 4.3 4.1 4.7  CL 102 98 97*  CO2 18* 21* 18*  GLUCOSE 97 116* 143*  BUN 103* 82* 107*  CREATININE 7.35* 6.63* 7.91*  CALCIUM 8.4* 8.5* 8.8*  PHOS 7.9* 6.2* 6.7*   CBC Recent Labs  Lab 11/11/20 0622 11/13/20 0600 11/14/20 1323  WBC 32.1* 28.4* 28.9*  HGB 7.3* 7.4* 7.2*  HCT 21.2* 22.4* 23.1*  MCV 88.7 91.4 94.3  PLT  123* 129* 163    Medications:    . acetaminophen (TYLENOL) oral liquid 160 mg/5 mL  650 mg Oral Q6H  . amiodarone  400 mg Oral BID  . B-complex with vitamin C  1 tablet Oral Daily  . Chlorhexidine Gluconate Cloth  6 each Topical Q0600  . darbepoetin (ARANESP) injection - DIALYSIS  200 mcg Intravenous Q Tue-HD  . feeding supplement (NEPRO CARB STEADY)  237 mL Oral TID WC  . Gerhardt's butt cream   Topical TID  . insulin aspart  3-9 Units Subcutaneous TID WC  . lactobacillus acidophilus  2 tablet Oral TID  . lidocaine  1 patch Transdermal Q24H  . mouth rinse  15 mL Mouth Rinse BID  . midodrine  10 mg Oral TID WC  . predniSONE  40 mg Oral Q breakfast  . senna-docusate  2 tablet Oral QHS  . sucralfate  1 g Oral TID WC & HS  . triamcinolone 0.1 % cream : eucerin   Topical BID    Louis Meckel, MD  11/16/2020, 12:14 PM

## 2020-11-16 NOTE — Progress Notes (Signed)
Patient ID: Raymond Hurley., male   DOB: Oct 06, 1931, 87 y.o.   MRN: [id number]  This NP reviewed medical records,  discussed case with team.   Patient is currently in CIR and working well with therapies. He is  HD dependent, frail and with multiple co-morbidites, high risk to decompensate.  I met at patient's bedside, he is alert and oriented.  He is pleasant and friendly.  Auria/LCSW is with patient at this time  Created space and opportunity for patient  to explore his thoughts and feeling regarding his current medical situation.   Patient is optimistic for continued improvement,  He is looking forward to several weeks in rehab for continued improvement.    Education offered on the importance of mobility.    Education offered on the importance of conversation and documentation of advanced care planning.  Hope to meet with patient and family to complete MOST form prior to discharge.  Emotional support offered.     Questions and concerns addressed   I will continue to follow up with [name] intermittently during his rehab stay. I encouraged him to call will questions or concerns.   Total time spent on the unit was 15 minutes  Greater than 50% of the time was spent in counseling and coordination of care  Wadie Lessen NP  Palliative Medicine Team Team Phone # [phone] Pager [phone]

## 2020-11-16 NOTE — Progress Notes (Signed)
Physical Therapy Session Note  Patient Details  Name: Raymond Hurley. MRN: [id number] Date of Birth: 11/18/1930  Today's Date: 11/16/2020 PT Individual Time: 0800-0857 and 1015-1055 PT Individual Time Calculation (min): 57 min and 40 min  Short Term Goals: Week 1:  PT Short Term Goal 1 (Week 1): Pt will trasnfer to WC with CGA and LRAD PT Short Term Goal 2 (Week 1): Pt will performed bed mobility with CGA PT Short Term Goal 3 (Week 1): Pt will ambulate 50ft with min assist and LRAD PT Short Term Goal 4 (Week 1): Pt will propell WC 121ft with supervision assist PT Short Term Goal 5 (Week 1): pt will tolerate sitting up in WC >2 hours between therapies  Skilled Therapeutic Interventions/Progress Updates:   Treatment Session 1: 9449-6759 57 min Received pt sitting on bedside commode reporting feeling "good" this morning. Session with emphasis on toileting, functional mobility/transfers, generalized strengthening, dynamic standing balance/coordination, self-care, and improved activity tolerance. Pt with loose BM (NT, RN, and MD aware) and transferred sit<>stand in [facility] already in bathroom upon PT arrival) with heavy max A and required total A for peri-care. Noted pt's sacral dressing soiled. RN notified and present to change dressing standing in [place] with assist of PT. Pt performed 4 additional semi-sands from Stedy pads with min A of 1 to continue peri-care, get dressing replaced, and for MD to inspect wound. Pt able to remain standing ~45 seconds each time and required extensive rest breaks (~4 minutes) in between each standing trial due to fatigue, SOB, and c/o dizziness. Pt transferred toilet with bedside commode<>WC via Stedy dependently and sat in [facility] and brushed teeth, applied lotion, cleaned glasses, combed hair, and washed face sitting in WC at sink with supervision and increased time. Pt reported feeling "shaky" throughout session and required multiple extended rest breaks  throughout. Donned bilateral ted hose and non-skid socks with total A. Concluded session with pt sitting in WC, needs within reach, and seatbelt alarm on. Pillow positioned behind low back for comfort.   Treatment Session 2: 1015-1055 40 min Received pt sitting in WC, pt agreeable to therapy, and denied any pain during session. Session with emphasis on functional mobility/transfers, generalized strengthening, dynamic standing balance/coordination, ambulation, and improved activity tolerance. Pt transported to dayroom in [facility] total A for time management purposes and performed BLE strengthening on Kinetron at 20 cm/sec for 1 minute x 3 trials with emphasis on quad/glute strengthening. Pt then transferred sit<>stand with RW and min A and ambulated 38ft x 1 and 51ft x 1 with RW and min A +2 for close WC follow. Pt demonstrated narrow BOS, decreased stride length, and flexed trunk. Pt denied any dizziness but reported feeling "weak" and required multiple extended rest breaks throughout session. Pt reported not having schedule for therapy today; therapist printed out schedule and went over it with pt as he had questions regarding dialysis schedule. Pt transported back to room in [facility] total A. Concluded session with pt sitting in WC, needs within reach, and seatbelt alarm on.   Therapy Documentation Precautions:  Precautions Precautions: Fall,Other (comment) Precaution Comments: Bowel incontinence (apparently had flexiseal removed yesterday) Restrictions Weight Bearing Restrictions: No   Therapy/Group: Individual Therapy Alfonse Alpers PT, DPT   11/16/2020, 7:21 AM

## 2020-11-16 NOTE — Progress Notes (Signed)
Physical Therapy Session Note  Patient Details  Name: Raymond Hurley. MRN: [id number] Date of Birth: 1931/05/18  Today's Date: 11/16/2020 PT Individual Time: 1300-1310 PT Individual Time Calculation (min): 10 min   Short Term Goals: Week 1:  PT Short Term Goal 1 (Week 1): Pt will trasnfer to WC with CGA and LRAD PT Short Term Goal 2 (Week 1): Pt will performed bed mobility with CGA PT Short Term Goal 3 (Week 1): Pt will ambulate 23ft with min assist and LRAD PT Short Term Goal 4 (Week 1): Pt will propell WC 123ft with supervision assist PT Short Term Goal 5 (Week 1): pt will tolerate sitting up in WC >2 hours between therapies  PAIN Denies pain Pt seen this pm for brief session due to arrival of dialysis transport.  Pt initially oob in wc.  Sit to stand from wc w/min assist, additional time, difficulty w/adequate ant wt shift w/transition but does achieve upright.   Gait 46ft to top of bed w/rw and cga, turn/sit to bed w/cga. Sit to supine w/min assist for RLE management. Pt able to scoot to hob using bedrails and bed features only. Pt positioned for comfort and handed off to dialysis tech for transport.  Therapy Documentation Precautions:  Precautions Precautions: Fall,Other (comment) Precaution Comments: Bowel incontinence (apparently had flexiseal removed yesterday) Restrictions Weight Bearing Restrictions: No    Therapy/Group: Individual Therapy  Callie Fielding, [place] 11/16/2020, 1:28 PM

## 2020-11-16 NOTE — Progress Notes (Signed)
Occupational Therapy Note  Patient Details  Name: Raymond Hurley. MRN: [id number] Date of Birth: 02/06/1931  Today's Date: 11/16/2020 OT Missed Time: 52 Minutes Missed Time Reason: Other (comment) (dialysis)  Pt out to dialysis during OT scheduled treatment time.   Ezekiel Slocumb 11/16/2020, 5:44 PM

## 2020-11-17 LAB — GLUCOSE, CAPILLARY
Glucose-Capillary: 95 mg/dL (ref 70–99)
Glucose-Capillary: 90 mg/dL (ref 70–99)
Glucose-Capillary: 100 mg/dL — ABNORMAL HIGH (ref 70–99)
Glucose-Capillary: 108 mg/dL — ABNORMAL HIGH (ref 70–99)
Glucose-Capillary: 115 mg/dL — ABNORMAL HIGH (ref 70–99)

## 2020-11-17 NOTE — Progress Notes (Signed)
Physical Therapy Session Note  Patient Details  Name: Raymond Hurley. MRN: [id number] Date of Birth: Mar 02, 1931  Today's Date: 11/17/2020 PT Individual Time: 1100-1158 PT Individual Time Calculation (min): 58 min   Short Term Goals: Week 1:  PT Short Term Goal 1 (Week 1): Pt will trasnfer to WC with CGA and LRAD PT Short Term Goal 2 (Week 1): Pt will performed bed mobility with CGA PT Short Term Goal 3 (Week 1): Pt will ambulate 40ft with min assist and LRAD PT Short Term Goal 4 (Week 1): Pt will propell WC 151ft with supervision assist PT Short Term Goal 5 (Week 1): pt will tolerate sitting up in WC >2 hours between therapies  Skilled Therapeutic Interventions/Progress Updates:    Pt greeted seated upright in w/c, awake and agreeable to therapy. No reports of pain. Able to recall events in prior therapy sessions. W/c transport for time management to main therapy gym. Focused beginning of session on strengthening activity tolerance ,endurance, and gait. Interval gait training 56ft + 92ft + 56ft with minA and RW with +2 assist for w/c follow for safety. Gait deficits include decreased gait speed, shuffling steps with decreased step length, mild forward flexed trunk. Cues for correcting gait deficits, paced breathing, and improving forward gaze. He did require a few minutes of seated rest b/w bouts due to fatigue but pt was very pleased with progress. Stand<>pivot with minA and no AD from w/c to mat table. Performed the following seated there-ex while unsupported to facilitate core activation and sitting balance. -2x10 alternating LAQ with 2.5# ankle weight -thoracic rotations with 6lb med ball -chest press with 6lb med ball *Pt extended rest breaks b/w sets due to fatigue  Stand pivot transfer with minA form mat table back to his w/c and returned to his room with totalA for energy conservation. Pt with questions regarding dialysis schedule. RN was notified to assist with this. He ended  session seated in w/c with safety belt alarm on and his needs within reach. NT present for BG test.   Therapy Documentation Precautions:  Precautions Precautions: Fall,Other (comment) Precaution Comments: Bowel incontinence (apparently had flexiseal removed yesterday) Restrictions Weight Bearing Restrictions: No  Therapy/Group: Individual Therapy  Soleil Mas P Inika Bellanger PT 11/17/2020, 7:40 AM

## 2020-11-17 NOTE — Plan of Care (Signed)
  Problem: Consults Goal: RH GENERAL PATIENT EDUCATION Description: See Patient Education module for education specifics. Outcome: Progressing Goal: Skin Care Protocol Initiated - if Braden Score 18 or less Description: If consults are not indicated, leave blank or document N/A Outcome: Progressing   Problem: RH BOWEL ELIMINATION Goal: RH STG MANAGE BOWEL WITH ASSISTANCE Description: STG Manage Bowel with mod I Assistance. Outcome: Progressing   Problem: RH BLADDER ELIMINATION Goal: RH STG MANAGE BLADDER WITH ASSISTANCE Description: STG Manage Bladder With mod I Assistance Outcome: Progressing   Problem: RH SKIN INTEGRITY Goal: RH STG SKIN FREE OF INFECTION/BREAKDOWN Description: Breakdown to skin will improve according to weekly measurements with min assist Outcome: Progressing Goal: RH STG MAINTAIN SKIN INTEGRITY WITH ASSISTANCE Description: STG Maintain Skin Integrity With min Assistance. Outcome: Progressing Goal: RH STG ABLE TO PERFORM INCISION/WOUND CARE W/ASSISTANCE Description: STG Able To Perform Incision/Wound Care With min Assistance. Outcome: Progressing   Problem: RH SAFETY Goal: RH STG ADHERE TO SAFETY PRECAUTIONS W/ASSISTANCE/DEVICE Description: STG Adhere to Safety Precautions With cues/reminders  Assistance/Device. Outcome: Progressing   Problem: RH PAIN MANAGEMENT Goal: RH STG PAIN MANAGED AT OR BELOW PT'S PAIN GOAL Description: Pain will be managed at or below 3 out of 10 on pain scale with min assist Outcome: Progressing   Problem: RH KNOWLEDGE DEFICIT GENERAL Goal: RH STG INCREASE KNOWLEDGE OF SELF CARE AFTER HOSPITALIZATION Description: Pt will increase knowledge of self care with min assit from materials provided to pt by nursing and therapy Outcome: Progressing

## 2020-11-17 NOTE — Progress Notes (Signed)
Physical Therapy Session Note  Patient Details  Name: Raymond Hurley. MRN: [id number] Date of Birth: [birth year]  Today's Date: 11/17/2020 PT Individual Time: 0800-0915 PT Individual Time Calculation (min): 75 min   Short Term Goals: Week 1:  PT Short Term Goal 1 (Week 1): Pt will trasnfer to WC with CGA and LRAD PT Short Term Goal 2 (Week 1): Pt will performed bed mobility with CGA PT Short Term Goal 3 (Week 1): Pt will ambulate 73ft with min assist and LRAD PT Short Term Goal 4 (Week 1): Pt will propell WC 157ft with supervision assist PT Short Term Goal 5 (Week 1): pt will tolerate sitting up in WC >2 hours between therapies Week 2:    Week 3:     Skilled Therapeutic Interventions/Progress Updates:    PAIN Pt c/o pain at site of sacral wound w/sitting. Repositioning/adjustements to seating /see below.    Pt received on commode.  Sit to stand w/steady and mod assist.  In standing, nursing applied sacral dressing, cga, total assist for raising pants.  Pt rests in perched sitting on steady then transferred to wc via stedy.   Pt rests in sitting while receiving am meds. Pt transported to sink and brushes teeth, washes face, combs hair, applies lotion to face w/set up only.  Pt transported to gym for session.     Gait 31ft, 13ft w/RW, min assist, mild wobbles at bilat knees, shuffling gait, decreased cadence, second person wc follow for safety. HR 89, 02 sats 100% following gait trials.  Extended rest break needed for recovery between efforts.  wc positioning:  Leg rests shortened to improve hip angle, distribute pressure more evenly across thighs.  trialed solid back but pt no improvement in comfort w/this.  Discussed option of TIS if sitting tolerance continues to be limited by discomfort/benefits of pressure relief.  Sit to stand in parallel bars w/min to mod assist for transition, cga for standing w/bilat UE support.  Tolerated 45 sec standing w/distraction by therapist. Pt begins  to shake due to fatigue. Pt transported back to room at end of session.  Pt left oob in wc w/alarm belt set and needs in reach    Therapy Documentation Precautions:  Precautions Precautions: Fall,Other (comment) Precaution Comments: Bowel incontinence (apparently had flexiseal removed yesterday) Restrictions Weight Bearing Restrictions: No    Therapy/Group: Individual Therapy  Callie Fielding, PT   Jerrilyn Cairo 11/17/2020, 12:43 PM

## 2020-11-17 NOTE — Progress Notes (Signed)
Occupational Therapy Weekly Progress Note  Patient Details  Name: Raymond Hurley. MRN: [id number] Date of Birth: 1931/10/06  Beginning of progress report period: November 11, 2020 End of progress report period: November 17, 2020  Today's Date: 11/17/2020 OT Individual Time: 7867-5449 OT Individual Time Calculation (min): 60 min    Patient has met 2 of 4 short term goals.  Pt is making slow, but steady progress towards goals.  Pt is able to complete sit > stand with min-mod assist and stand pivot transfers with min assist with RW.  Pt does continue to require max if even +2 with Stedy for sit > stand from toilet due to decreased endurance post prolonged sitting on toilet for BM.  Pt is able to complete LB dressing with increased time to thread BLE with use of reacher when threading RLE, plan to introduce sock aid for socks during future sessions.  Pt is able to tolerate standing 10-20 seconds at a time before requiring return to sit due to lightheadedness and dizziness, however no orthostatic hypotension.   Patient continues to demonstrate the following deficits: muscle weakness, decreased cardiorespiratoy endurance and decreased sitting balance, decreased standing balance, decreased postural control and decreased balance strategies and therefore will continue to benefit from skilled OT intervention to enhance overall performance with BADL and Reduce care partner burden.  Patient progressing toward long term goals..  Continue plan of care.  OT Short Term Goals Week 1:  OT Short Term Goal 1 (Week 1): Pt iwll transfer to toilet with MIN A consistently OT Short Term Goal 1 - Progress (Week 1): Progressing toward goal OT Short Term Goal 2 (Week 1): Pt will thread BLE into pants wiht AE PRN OT Short Term Goal 2 - Progress (Week 1): Met OT Short Term Goal 3 (Week 1): PT will complete 1/3 steps of toileting OT Short Term Goal 3 - Progress (Week 1): Met OT Short Term Goal 4 (Week 1): pt will stand  to groom to demo improved activity tolernace OT Short Term Goal 4 - Progress (Week 1): Progressing toward goal Week 2:  OT Short Term Goal 1 (Week 2): Pt will transfer to toilet with MIN A consistently OT Short Term Goal 2 (Week 2): Pt will stand to complete 1 grooming task to demo improved activity tolerance OT Short Term Goal 3 (Week 2): Pt will complete 2/3 toileting steps consistently OT Short Term Goal 4 (Week 2): Pt will don socks/shoes with min assist with AE PRN  Skilled Therapeutic Interventions/Progress Updates:    Treatment session with focus on LB dressing, education on use of AE, sit > stand, and stand pivot transfers.  Pt received upright in w/c reporting fatigue from previous session but agreeable to therapy session.  Pt reports already completing grooming tasks.  Discussed progress towards goals with desire to address LB dressing this session as not much focus to this point.  Therapist introduced reacher for LB dressing with pt able to doff pants with increased time with use of reacher to advance pants down legs and off feet.  Pt then utilized reacher to thread RLE but able to thread LLE without use of AE.  Pt completed sit > stand min-mod assist with cues for hand placement during sit > stand.  Pt able to doff pants over hips in preparation to doff pants and then when donning pants, pt able to pull pants over R hip and required min assist to advance over L hip.  Pt required prolonged seated  rest break between each bout of standing.  Engaged in stand pivot transfers x4 w/c <> therapy mat with focus on sequencing and weight shift.  Pt able to complete sit > stand with min assist and then min-mod assist and mod cues for sequencing during transfer.  Pt returned to room and remained upright in w/c with seat belt alarm on and all needs in reach.  Therapy Documentation Precautions:  Precautions Precautions: Fall,Other (comment) Precaution Comments: Bowel incontinence (apparently had flexiseal  removed yesterday) Restrictions Weight Bearing Restrictions: No Pain:  Pt with no c/o pain   Therapy/Group: Individual Therapy  [name] 11/17/2020, 10:18 AM

## 2020-11-17 NOTE — Progress Notes (Signed)
[place] PHYSICAL MEDICINE & REHABILITATION PROGRESS NOTE   Subjective/Complaints: Patient seen sitting up working with therapy this morning.  He states he slept well overnight.  Discussed pressure relief with patient and therapies again this morning.  He was seen by palliative care yesterday, notes reviewed-support offered.  ROS: Denies CP, SOB, N/V/D  Objective:   No results found. Recent Labs    11/16/20 1313 11/16/20 1801  WBC 26.1* 24.2*  HGB 7.4* 7.5*  HCT 24.0* 23.0*  PLT 202 145*   Recent Labs    11/16/20 1313 11/16/20 1801  NA 137 138  K 4.6 3.7  CL 98 98  CO2 17* 23  GLUCOSE 143* 163*  BUN 83* 42*  CREATININE 7.06* 4.52*  CALCIUM 9.3 9.0    Intake/Output Summary (Last 24 hours) at 11/17/2020 1329 Last data filed at 11/17/2020 3419 Gross per 24 hour  Intake 640 ml  Output 2500 ml  Net -1860 ml     Pressure Injury 11/10/20 Buttocks Right;Mid Unstageable - Full thickness tissue loss in which the base of the injury is covered by slough (yellow, tan, gray, green or brown) and/or eschar (tan, brown or black) in the wound bed. red open area of partial s (Active)  11/10/20 1721  Location: Buttocks  Location Orientation: Right;Mid  Staging: Unstageable - Full thickness tissue loss in which the base of the injury is covered by slough (yellow, tan, gray, green or brown) and/or eschar (tan, brown or black) in the wound bed.  Wound Description (Comments): red open area of partial skin loss with eschar covering wound bed  Present on Admission: Yes    Physical Exam: Vital Signs Blood pressure (!) 101/49, pulse 67, temperature 97.8 F (36.6 C), resp. rate 18, height 5\' 10"  (1.778 m), weight 108.6 kg, SpO2 100 %.  Constitutional: No distress . Vital signs reviewed. HENT: Normocephalic.  Atraumatic. Eyes: EOMI. No discharge. Cardiovascular: No JVD.  RRR. Respiratory: Normal effort.  No stridor.  Bilateral clear to auscultation. GI: Non-distended.  BS +.  +  Distended. Skin: Warm and dry.   Gluteal ulcer not examined today Psych: Normal mood.  Normal behavior. Musc: Bilateral lower extreme edema. Neuro: Alert Motor: Bilateral upper extremities: 4+/5 throughout, unchanged Left lower extremity: Hip flexion, knee extension 4-/5, ankle dorsiflexion 4+/5, unchanged Right lower extremity: Hip flexion, knee extension 3/5, ankle dorsiflexion 4+/5   Assessment/Plan: 1. Functional deficits which require 3+ hours per day of interdisciplinary therapy in a comprehensive inpatient rehab setting.  Physiatrist is providing close team supervision and 24 hour management of active medical problems listed below.  Physiatrist and rehab team continue to assess barriers to discharge/monitor patient progress toward functional and medical goals  Care Tool:  Bathing    Body parts bathed by patient: Right arm,Left arm,Chest,Abdomen,Face   Body parts bathed by helper: Buttocks,Right lower leg,Left lower leg     Bathing assist Assist Level: Supervision/Verbal cueing     Upper Body Dressing/Undressing Upper body dressing   What is the patient wearing?: Pull over shirt    Upper body assist Assist Level: Supervision/Verbal cueing    Lower Body Dressing/Undressing Lower body dressing      What is the patient wearing?: Pants     Lower body assist Assist for lower body dressing: Minimal Assistance - Patient > 75%     Toileting Toileting    Toileting assist Assist for toileting: Maximal Assistance - Patient 25 - 49%     Transfers Chair/bed transfer  Transfers assist  Chair/bed transfer assist level: Moderate Assistance - Patient 50 - 74% (RW)     Locomotion Ambulation   Ambulation assist   Ambulation activity did not occur: Safety/medical concerns  Assist level: 2 helpers Assistive device: Walker-rolling Max distance: 10   Walk 10 feet activity   Assist  Walk 10 feet activity did not occur: Safety/medical concerns  Assist  level: 2 helpers Assistive device: Walker-rolling   Walk 50 feet activity   Assist Walk 50 feet with 2 turns activity did not occur: Safety/medical concerns         Walk 150 feet activity   Assist Walk 150 feet activity did not occur: Safety/medical concerns         Walk 10 feet on uneven surface  activity   Assist Walk 10 feet on uneven surfaces activity did not occur: Safety/medical concerns         Wheelchair     Assist Will patient use wheelchair at discharge?: Yes Type of Wheelchair: Manual    Wheelchair assist level: Supervision/Verbal cueing Max wheelchair distance: 100    Wheelchair 50 feet with 2 turns activity    Assist        Assist Level: Supervision/Verbal cueing   Wheelchair 150 feet activity     Assist      Assist Level: Minimal Assistance - Patient > 75%     Medical Problem List and Plan: 1.  Generalized weakness affecting ability to stand or complete ADL tasks secondary to debility.  Continue CIR  2.  Antithrombotics: -DVT/anticoagulation:  Mechanical: Sequential compression devices, below knee Bilateral lower extremities due to GI bleed.             -antiplatelet therapy: N/A 3. Pain Management: Tylenol prn.  4. Mood: LCSW to follow for evaluation and support.              -antipsychotic agents: N/a 5. Neuropsych: This patient is capable of making decisions on his own behalf. 6. Skin/Wound Care: Routine pressure relief measures.   Discussed bedding and pressure relief for healing of sacral ulcer 7. Fluids/Electrolytes/Nutrition: Strict I/Os.  Labs with HD.discussed 1223ml fluid restriction  8. ESRD: On midodrine prior to HD to help support BP.  9. Chronic systolic CHF/CAF: Strict I/Os. Fluid status managed with HD. On Amiodarone 400 mg bid for rate control.              Daily weights  Lasix per Nephro, appreciate Recs Filed Weights   11/14/20 1610 11/15/20 0401 11/17/20 0500  Weight: 88.7 kg 119.7 kg 108.6 kg   ?   Reliability on 2/4  Monitor heart rate with increased activity 10.  Pancreatitis: Has completed antibiotic course. Tolerating diet without nausea, diarrhea or abdominal pain.              Monitor closely.  Tolerating diet at present 11. LGIB: On Carafate ac/hs.  12. Acute on chronic anemia: On aranresp weekly.              Labs with HD  Hemoglobin 7.5 on 2/3 13. Thrombocytopenia: Monitor for signs of bleeding.              Labs with HD  Platelets 145 on 2/3 14. Acute respiratory failure: Has resolved. prednisone 40 mg daily  15.  Leukocytosis-some steroid effect  WBCs 24.212/3, continue to monitor  Afebrile 16.  Steroid-induced hyperglycemia  SSI  Prednisone decreased to 20 on 2/4-continue to wean  Mildly elevated on 2/4  Continue to monitor  LOS: 7 days A FACE TO FACE EVALUATION WAS PERFORMED  Janthony Holleman Lorie Phenix 11/17/2020, 1:29 PM

## 2020-11-18 LAB — CBC
HCT: 22.7 % — ABNORMAL LOW (ref 39.0–52.0)
Hemoglobin: 6.9 g/dL — CL (ref 13.0–17.0)
MCH: 29.5 pg (ref 26.0–34.0)
MCHC: 30.4 g/dL (ref 30.0–36.0)
MCV: 97 fL (ref 80.0–100.0)
Platelets: 191 10*3/uL (ref 150–400)
RBC: 2.34 MIL/uL — ABNORMAL LOW (ref 4.22–5.81)
RDW: 25.1 % — ABNORMAL HIGH (ref 11.5–15.5)
WBC: 20.5 10*3/uL — ABNORMAL HIGH (ref 4.0–10.5)
nRBC: 0.1 % (ref 0.0–0.2)

## 2020-11-18 LAB — RENAL FUNCTION PANEL
Albumin: 2.8 g/dL — ABNORMAL LOW (ref 3.5–5.0)
Anion gap: 25 — ABNORMAL HIGH (ref 5–15)
BUN: 82 mg/dL — ABNORMAL HIGH (ref 8–23)
CO2: 16 mmol/L — ABNORMAL LOW (ref 22–32)
Calcium: 9.2 mg/dL (ref 8.9–10.3)
Chloride: 95 mmol/L — ABNORMAL LOW (ref 98–111)
Creatinine, Ser: 7.15 mg/dL — ABNORMAL HIGH (ref 0.61–1.24)
GFR, Estimated: 7 mL/min — ABNORMAL LOW (ref >60)
Glucose, Bld: 154 mg/dL — ABNORMAL HIGH (ref 70–99)
Phosphorus: 5.8 mg/dL — ABNORMAL HIGH (ref 2.5–4.6)
Potassium: 3.8 mmol/L (ref 3.5–5.1)
Sodium: 136 mmol/L (ref 135–145)

## 2020-11-18 LAB — PREPARE RBC (CROSSMATCH)

## 2020-11-18 LAB — GLUCOSE, CAPILLARY
Glucose-Capillary: 109 mg/dL — ABNORMAL HIGH (ref 70–99)
Glucose-Capillary: 84 mg/dL (ref 70–99)
Glucose-Capillary: 101 mg/dL — ABNORMAL HIGH (ref 70–99)
Glucose-Capillary: 113 mg/dL — ABNORMAL HIGH (ref 70–99)
Glucose-Capillary: 93 mg/dL (ref 70–99)

## 2020-11-18 MED ORDER — SODIUM CHLORIDE 0.9% IV SOLUTION: Freq: Once | INTRAVENOUS | Status: DC

## 2020-11-18 NOTE — Progress Notes (Signed)
Physical Therapy Session Note  Patient Details  Name: Raymond Hurley. MRN: [id number] Date of Birth: 21-Mar-1931  Today's Date: 11/18/2020 PT Individual Time: 1100-1156 PT Individual Time Calculation (min): 56 min   Short Term Goals: Week 1:  PT Short Term Goal 1 (Week 1): Pt will trasnfer to WC with CGA and LRAD PT Short Term Goal 2 (Week 1): Pt will performed bed mobility with CGA PT Short Term Goal 3 (Week 1): Pt will ambulate 70ft with min assist and LRAD PT Short Term Goal 4 (Week 1): Pt will propell WC 141ft with supervision assist PT Short Term Goal 5 (Week 1): pt will tolerate sitting up in WC >2 hours between therapies  Skilled Therapeutic Interventions/Progress Updates:  Pt was seen bedside in the am. Pt fatigued but willing to participate with therapy. Pt performed multiple sit to stand transfers with rolling walker and min A with verbal cues. Decreased standing tolerance noted. BP sitting in w/c 121/57 with HR 74 and took BP immediately after standing due to inability to tolerate standing long enough to get BP it was 110/49 with HR 83. Pt performed LE exercises, hip flex and LAQs, 3 sets x 10 reps each. Pt stated he was just more fatigued today. Pt scheduled for dialysis this pm. Pt returned to room and left sitting up in w/c with chair alarm on and all needs within reach.   Therapy Documentation Precautions:  Precautions Precautions: Fall,Other (comment) Precaution Comments: Bowel incontinence (apparently had flexiseal removed yesterday) Restrictions Weight Bearing Restrictions: No General:   Pain: Pain Assessment Pain Scale: 0-10 Pain Score: 0-No pain  Therapy/Group: Individual Therapy  Dub Amis 11/18/2020, 12:11 PM

## 2020-11-18 NOTE — Plan of Care (Signed)
  Problem: Consults Goal: RH GENERAL PATIENT EDUCATION Description: See Patient Education module for education specifics. Outcome: Progressing Goal: Skin Care Protocol Initiated - if Braden Score 18 or less Description: If consults are not indicated, leave blank or document N/A Outcome: Progressing   Problem: RH BOWEL ELIMINATION Goal: RH STG MANAGE BOWEL WITH ASSISTANCE Description: STG Manage Bowel with mod I Assistance. Outcome: Progressing   Problem: RH BLADDER ELIMINATION Goal: RH STG MANAGE BLADDER WITH ASSISTANCE Description: STG Manage Bladder With mod I Assistance Outcome: Progressing   Problem: RH SKIN INTEGRITY Goal: RH STG SKIN FREE OF INFECTION/BREAKDOWN Description: Breakdown to skin will improve according to weekly measurements with min assist Outcome: Progressing Goal: RH STG MAINTAIN SKIN INTEGRITY WITH ASSISTANCE Description: STG Maintain Skin Integrity With min Assistance. Outcome: Progressing Goal: RH STG ABLE TO PERFORM INCISION/WOUND CARE W/ASSISTANCE Description: STG Able To Perform Incision/Wound Care With min Assistance. Outcome: Progressing   Problem: RH SAFETY Goal: RH STG ADHERE TO SAFETY PRECAUTIONS W/ASSISTANCE/DEVICE Description: STG Adhere to Safety Precautions With cues/reminders  Assistance/Device. Outcome: Progressing   Problem: RH PAIN MANAGEMENT Goal: RH STG PAIN MANAGED AT OR BELOW PT'S PAIN GOAL Description: Pain will be managed at or below 3 out of 10 on pain scale with min assist Outcome: Progressing   Problem: RH KNOWLEDGE DEFICIT GENERAL Goal: RH STG INCREASE KNOWLEDGE OF SELF CARE AFTER HOSPITALIZATION Description: Pt will increase knowledge of self care with min assit from materials provided to pt by nursing and therapy Outcome: Progressing

## 2020-11-18 NOTE — Progress Notes (Signed)
Patient ID: [name]., male   DOB: 10-17-1930, 87 y.o.   MRN: [id number] [facility] Progress Note   Assessment/ Plan:   1. Acute kidney Injury: With baseline creatinine of 1.2 and acute kidney injury likely from ATN associated with Covid infection/acute pancreatitis and shock.  Unfortunately, remains anuric/with minimal urine output and we will continue renal replacement therapy with hemodialysis on a TTS schedule while here (on schedule today).  At this point, he has been dialysis dependent for the last 6 weeks and likely has evolved to end-stage renal disease.  We will pursue permanent access as an outpatient after he is stabilized from a clinical standpoint and able to tolerate long-term dialysis.  On midodrine for blood pressure support.  Did not respond to recent trial of Lasix. 2.  Acute pancreatitis: This was noted on CT scan earlier (1/3 and 1/7).  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 3.  Acute hypoxic respiratory failure: Secondary to Covid pneumonia and possible aspiration pneumonitis.  Status post corticosteroids and supportive management-now on room air and without supplemental oxygen requirements. 4.  Anemia: Secondary to acute/critical illness and GI losses.  Status post multiple PRBC transfusions and now on ESA. 5.  Secondary hyperparathyroidism: Phosphorus level control with hemodialysis.  Subjective:   Reports minimal urine output overnight and denies any chest pain or shortness of breath.  Still has swelling of the lower extremities (right >left).   Objective:   BP (!) 109/54 (BP Location: Right Arm)   Pulse 68   Temp 98 F (36.7 C)   Resp 16   Ht 5\' 10"  (1.778 m)   Wt 108.6 kg Comment: per bed  SpO2 99%   BMI 34.35 kg/m   Intake/Output Summary (Last 24 hours) at 11/18/2020 1042 Last data filed at 11/18/2020 4970 Gross per 24 hour  Intake 580 ml  Output -  Net 580 ml    Weight change:   Physical Exam: Gen: Comfortably sitting up in wheelchair, speaking on phone. CVS: Pulse regular rhythm, normal rate, S1 and S2 normal Resp: Diminished breath sounds over bases-poor inspiratory effort.  No distinct rales or rhonchi. Abd: Soft, moderately distended, nontender Ext: Compression stockings bilaterally.  2+ edema right lower extremity, 1-2+ edema left lower extremity  Imaging: No results found.  Labs: BMET Recent Labs  Lab 11/13/20 0600 11/14/20 1323 11/16/20 1313 11/16/20 1801  NA 138 136 137 138  K 4.1 4.7 4.6 3.7  CL 98 97* 98 98  CO2 21* 18* 17* 23  GLUCOSE 116* 143* 143* 163*  BUN 82* 107* 83* 42*  CREATININE 6.63* 7.91* 7.06* 4.52*  CALCIUM 8.5* 8.8* 9.3 9.0  PHOS 6.2* 6.7* 5.8* 4.1   CBC Recent Labs  Lab 11/13/20 0600 11/14/20 1323 11/16/20 1313 11/16/20 1801  WBC 28.4* 28.9* 26.1* 24.2*  HGB 7.4* 7.2* 7.4* 7.5*  HCT 22.4* 23.1* 24.0* 23.0*  MCV 91.4 94.3 96.4 92.4  PLT 129* 163 202 145*    Medications:    . acetaminophen (TYLENOL) oral liquid 160 mg/5 mL  650 mg Oral Q6H  . amiodarone  400 mg Oral BID  . B-complex with vitamin C  1 tablet Oral Daily  . Chlorhexidine Gluconate Cloth  6 each Topical Q0600  . darbepoetin (ARANESP) injection - DIALYSIS  200 mcg Intravenous Q Tue-HD  . feeding supplement (NEPRO CARB STEADY)  237 mL Oral TID WC  . Gerhardt's butt cream   Topical  TID  . insulin aspart  3-9 Units Subcutaneous TID WC  . lactobacillus acidophilus  2 tablet Oral TID  . lidocaine  1 patch Transdermal Q24H  . mouth rinse  15 mL Mouth Rinse BID  . midodrine  10 mg Oral TID WC  . predniSONE  20 mg Oral Q breakfast  . senna-docusate  2 tablet Oral QHS  . sucralfate  1 g Oral TID WC & HS  . triamcinolone 0.1 % cream : eucerin   Topical BID   Elmarie Shiley, MD 11/18/2020, 10:42 AM

## 2020-11-18 NOTE — Progress Notes (Signed)
Occupational Therapy Session Note  Patient Details  Name: Raymond Hurley. MRN: [id number] Date of Birth: Aug 20, 1931  Today's Date: 11/18/2020 OT Individual Time: 0865-7846 OT Individual Time Calculation (min): 49 min    Short Term Goals: Week 2:  OT Short Term Goal 1 (Week 2): Pt will transfer to toilet with MIN A consistently OT Short Term Goal 2 (Week 2): Pt will stand to complete 1 grooming task to demo improved activity tolerance OT Short Term Goal 3 (Week 2): Pt will complete 2/3 toileting steps consistently OT Short Term Goal 4 (Week 2): Pt will don socks/shoes with min assist with AE PRN  Skilled Therapeutic Interventions/Progress Updates:    Pt received in w/c with NT present, agreeable to therapy. Session focus on LB dressing AE, STS, and seated grooming. Donned pants with overall min A to use reacher to thread RLE into pants. Donned TEDs with total A. Donned B gripper socks with overall mod A to set-up sock aide for L foot and to adjust sock over R foot. Will cont to benefit from further practice, pt states "I need to learn how to do this by myself because there is no one else at home to help me." STS with RW + min A for initial lift and to facilitate upright posture, req increased time to initiate transfer. States he is getting shaky after ~15 seconds, req seated rest break. BP read at 108/49 (66), recovered to 110/ 59 (73). Brushed teeth, washed face, and combed hair with set-up + increased time seated in w/c at sink. Pt left in w/c with safety belt alarm engaged, call bell in reach, and all immediate needs met.    Therapy Documentation Precautions:  Precautions Precautions: Fall,Other (comment) Precaution Comments: Bowel incontinence (apparently had flexiseal removed yesterday) Restrictions Weight Bearing Restrictions: No Pain: Pain Assessment Pain Scale: 0-10 Pain Score: 0-No pain ADL: See Care Tool for more details.   Therapy/Group: Individual Therapy  Volanda Napoleon MS, OTR/L  11/18/2020, 9:52 AM

## 2020-11-19 LAB — TYPE AND SCREEN
ABO/RH(D): O POS
Antibody Screen: NEGATIVE
Unit division: 0

## 2020-11-19 LAB — BPAM RBC
Blood Product Expiration Date: 202203082359
ISSUE DATE / TIME: 202202051543
Unit Type and Rh: 5100

## 2020-11-19 LAB — GLUCOSE, CAPILLARY
Glucose-Capillary: 118 mg/dL — ABNORMAL HIGH (ref 70–99)
Glucose-Capillary: 95 mg/dL (ref 70–99)
Glucose-Capillary: 129 mg/dL — ABNORMAL HIGH (ref 70–99)
Glucose-Capillary: 105 mg/dL — ABNORMAL HIGH (ref 70–99)

## 2020-11-19 NOTE — Progress Notes (Addendum)
Patient ID: Raymond Hurley., male   DOB: 05-Aug-1931, 87 y.o.   MRN: [id number] [facility] Progress Note   Assessment/ Plan:   1. Acute kidney Injury: With baseline creatinine of 1.2 and acute kidney injury likely from ATN associated with Covid infection/acute pancreatitis and shock.  Unfortunately, remains anuric/with minimal urine output recorded and to continue hemodialysis on a TTS schedule while here (last dialyzed 2/5).  He is suspected to have evolved into end-stage renal disease with dialysis dependent acute kidney injury for the past 6 weeks.  The plan is to pursue permanent access as an outpatient after he gets physically stronger. 2.  Acute pancreatitis: This was noted on CT scan earlier (1/3 and 1/7).  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 3.  Acute hypoxic respiratory failure: Secondary to Covid pneumonia and possible aspiration pneumonitis.  Status post corticosteroids and supportive management-now on room air and without supplemental oxygen requirements. 4.  Anemia: Secondary to acute/critical illness and GI losses.  Continue current dose of ESA and is status post PRBC transfusion yesterday at dialysis. 5.  Secondary hyperparathyroidism: Phosphorus level appears to be relatively well controlled with hemodialysis/current diet.  Subjective:   Reports that he feels better this morning and feels that PRBC transfusion yesterday at dialysis may have had something to do with it.  Complains of loose/watery stools overnight-scheduled Senokot discontinued.   Objective:   BP (!) 115/59 (BP Location: Right Arm)   Pulse 70   Temp (!) 97.5 F (36.4 C)   Resp 18   Ht 5\' 10"  (1.778 m)   Wt 114 kg   SpO2 98%   BMI 36.06 kg/m   Intake/Output Summary (Last 24 hours) at 11/19/2020 0932 Last data filed at 11/19/2020 0900 Gross per 24 hour  Intake 480 ml  Output 2000 ml  Net -1520 ml   Weight change:    Physical Exam: Gen: Comfortably resting flat in bed, intermittently suctioning himself CVS: Pulse regular rhythm, normal rate, S1 and S2 normal Resp: Diminished breath sounds over bases-poor inspiratory effort.  No distinct rales or rhonchi. Abd: Soft, moderately distended, nontender Ext: Compression stockings bilaterally.  2+ edema right lower extremity, 1-2+ edema left lower extremity  Imaging: No results found.  Labs: BMET Recent Labs  Lab 11/13/20 0600 11/14/20 1323 11/16/20 1313 11/16/20 1801 11/18/20 1403  NA 138 136 137 138 136  K 4.1 4.7 4.6 3.7 3.8  CL 98 97* 98 98 95*  CO2 21* 18* 17* 23 16*  GLUCOSE 116* 143* 143* 163* 154*  BUN 82* 107* 83* 42* 82*  CREATININE 6.63* 7.91* 7.06* 4.52* 7.15*  CALCIUM 8.5* 8.8* 9.3 9.0 9.2  PHOS 6.2* 6.7* 5.8* 4.1 5.8*   CBC Recent Labs  Lab 11/14/20 1323 11/16/20 1313 11/16/20 1801 11/18/20 1403  WBC 28.9* 26.1* 24.2* 20.5*  HGB 7.2* 7.4* 7.5* 6.9*  HCT 23.1* 24.0* 23.0* 22.7*  MCV 94.3 96.4 92.4 97.0  PLT 163 202 145* 191    Medications:    . sodium chloride   Intravenous Once  . acetaminophen (TYLENOL) oral liquid 160 mg/5 mL  650 mg Oral Q6H  . amiodarone  400 mg Oral BID  . B-complex with vitamin C  1 tablet Oral Daily  . Chlorhexidine Gluconate Cloth  6 each Topical Q0600  . darbepoetin (ARANESP) injection - DIALYSIS  200 mcg Intravenous Q Tue-HD  . feeding supplement (NEPRO CARB STEADY)  237 mL Oral  TID WC  . Gerhardt's butt cream   Topical TID  . insulin aspart  3-9 Units Subcutaneous TID WC  . lactobacillus acidophilus  2 tablet Oral TID  . lidocaine  1 patch Transdermal Q24H  . mouth rinse  15 mL Mouth Rinse BID  . midodrine  10 mg Oral TID WC  . predniSONE  20 mg Oral Q breakfast  . sucralfate  1 g Oral TID WC & HS  . triamcinolone 0.1 % cream : eucerin   Topical BID   Elmarie Shiley, MD 11/19/2020, 9:32 AM

## 2020-11-19 NOTE — Plan of Care (Signed)
  Problem: Consults Goal: RH GENERAL PATIENT EDUCATION Description: See Patient Education module for education specifics. Outcome: Progressing Goal: Skin Care Protocol Initiated - if Braden Score 18 or less Description: If consults are not indicated, leave blank or document N/A Outcome: Progressing   Problem: RH BOWEL ELIMINATION Goal: RH STG MANAGE BOWEL WITH ASSISTANCE Description: STG Manage Bowel with mod I Assistance. Outcome: Progressing   Problem: RH BLADDER ELIMINATION Goal: RH STG MANAGE BLADDER WITH ASSISTANCE Description: STG Manage Bladder With mod I Assistance Outcome: Progressing   Problem: RH SKIN INTEGRITY Goal: RH STG SKIN FREE OF INFECTION/BREAKDOWN Description: Breakdown to skin will improve according to weekly measurements with min assist Outcome: Progressing Goal: RH STG MAINTAIN SKIN INTEGRITY WITH ASSISTANCE Description: STG Maintain Skin Integrity With min Assistance. Outcome: Progressing Goal: RH STG ABLE TO PERFORM INCISION/WOUND CARE W/ASSISTANCE Description: STG Able To Perform Incision/Wound Care With min Assistance. Outcome: Progressing   Problem: RH SAFETY Goal: RH STG ADHERE TO SAFETY PRECAUTIONS W/ASSISTANCE/DEVICE Description: STG Adhere to Safety Precautions With cues/reminders  Assistance/Device. Outcome: Progressing   Problem: RH PAIN MANAGEMENT Goal: RH STG PAIN MANAGED AT OR BELOW PT'S PAIN GOAL Description: Pain will be managed at or below 3 out of 10 on pain scale with min assist Outcome: Progressing   Problem: RH KNOWLEDGE DEFICIT GENERAL Goal: RH STG INCREASE KNOWLEDGE OF SELF CARE AFTER HOSPITALIZATION Description: Pt will increase knowledge of self care with min assit from materials provided to pt by nursing and therapy Outcome: Progressing

## 2020-11-19 NOTE — Progress Notes (Signed)
Physical Therapy Session Note  Patient Details  Name: Raymond Hurley. MRN: [id number] Date of Birth: December 16, 1930  Today's Date: 11/19/2020 PT Individual Time: 1331-1401 PT Individual Time Calculation (min): 30 min   Short Term Goals: Week 1:  PT Short Term Goal 1 (Week 1): Pt will trasnfer to WC with CGA and LRAD PT Short Term Goal 2 (Week 1): Pt will performed bed mobility with CGA PT Short Term Goal 3 (Week 1): Pt will ambulate 81ft with min assist and LRAD PT Short Term Goal 4 (Week 1): Pt will propell WC 119ft with supervision assist PT Short Term Goal 5 (Week 1): pt will tolerate sitting up in WC >2 hours between therapies  Skilled Therapeutic Interventions/Progress Updates:  Pt was seen bedside in the pm. Pt performed multiple sit to stand transfers with rolling walker and min A with verbal cues. While standing pt performed marching 2 sets x 5 reps each and 1 set x 10 reps each as well as min squats, 2 sets x 5 reps, 1 set x 7 reps and 1 set x 8 reps each. Pt left sitting up in w/c with chair alarm in place and call bell within reach.   Therapy Documentation Precautions:  Precautions Precautions: Fall,Other (comment) Precaution Comments: Bowel incontinence (apparently had flexiseal removed yesterday) Restrictions Weight Bearing Restrictions: No General:   Pain: No c/o pain.   Therapy/Group: Individual Therapy  Dub Amis 11/19/2020, 2:10 PM

## 2020-11-19 NOTE — Progress Notes (Addendum)
[facility] PHYSICAL MEDICINE & REHABILITATION PROGRESS NOTE   Subjective/Complaints: Patient seen sitting up in bed this morning.  He states he slept well overnight.  He notes loose stools.  He is notes limited ability to offload due to lower extremity weakness.  He was seen by nephrology, notes reviewed-after progressed to ESRD with plans for permanent access as outpatient.  ROS: Denies CP, SOB, N/V/D  Objective:   No results found. Recent Labs    11/16/20 1801 11/18/20 1403  WBC 24.2* 20.5*  HGB 7.5* 6.9*  HCT 23.0* 22.7*  PLT 145* 191   Recent Labs    11/16/20 1801 11/18/20 1403  NA 138 136  K 3.7 3.8  CL 98 95*  CO2 23 16*  GLUCOSE 163* 154*  BUN 42* 82*  CREATININE 4.52* 7.15*  CALCIUM 9.0 9.2    Intake/Output Summary (Last 24 hours) at 11/19/2020 1348 Last data filed at 11/19/2020 0900 Gross per 24 hour  Intake 240 ml  Output 2000 ml  Net -1760 ml     Pressure Injury 11/10/20 Buttocks Right;Mid Unstageable - Full thickness tissue loss in which the base of the injury is covered by slough (yellow, tan, gray, green or brown) and/or eschar (tan, brown or black) in the wound bed. red open area of partial s (Active)  11/10/20 1721  Location: Buttocks  Location Orientation: Right;Mid  Staging: Unstageable - Full thickness tissue loss in which the base of the injury is covered by slough (yellow, tan, gray, green or brown) and/or eschar (tan, brown or black) in the wound bed.  Wound Description (Comments): red open area of partial skin loss with eschar covering wound bed  Present on Admission: Yes    Physical Exam: Vital Signs Blood pressure (!) 115/59, pulse 70, temperature (!) 97.5 F (36.4 C), resp. rate 18, height 5\' 10"  (1.778 m), weight 114 kg, SpO2 98 %.  Constitutional: No distress . Vital signs reviewed. HENT: Normocephalic.  Atraumatic. Eyes: EOMI. No discharge. Cardiovascular: No JVD.  RRR. Respiratory: Normal effort.  No stridor.  Bilateral clear to  auscultation. GI: Non-distended.  BS +.  Distended. Skin: Warm and dry.   Gluteal ulcer not examined today. Psych: Normal mood.  Normal behavior. Musc: Bilateral lower extreme edema, stable. Neuro: Alert Motor: Bilateral upper extremities: 4+/5 throughout, unchanged Left lower extremity: Hip flexion, knee extension 4-/5, ankle dorsiflexion 4+/5, unchanged Right lower extremity: Hip flexion, knee extension 3 +/5, ankle dorsiflexion 4+/5   Assessment/Plan: 1. Functional deficits which require 3+ hours per day of interdisciplinary therapy in a comprehensive inpatient rehab setting.  Physiatrist is providing close team supervision and 24 hour management of active medical problems listed below.  Physiatrist and rehab team continue to assess barriers to discharge/monitor patient progress toward functional and medical goals  Care Tool:  Bathing    Body parts bathed by patient: Right arm,Left arm,Chest,Abdomen,Face   Body parts bathed by helper: Buttocks,Right lower leg,Left lower leg     Bathing assist Assist Level: Supervision/Verbal cueing     Upper Body Dressing/Undressing Upper body dressing   What is the patient wearing?: Pull over shirt    Upper body assist Assist Level: Supervision/Verbal cueing    Lower Body Dressing/Undressing Lower body dressing      What is the patient wearing?: Pants     Lower body assist Assist for lower body dressing: Minimal Assistance - Patient > 75%     Toileting Toileting    Toileting assist Assist for toileting: Maximal Assistance - Patient  25 - 49%     Transfers Chair/bed transfer  Transfers assist     Chair/bed transfer assist level: Moderate Assistance - Patient 50 - 74% (RW)     Locomotion Ambulation   Ambulation assist   Ambulation activity did not occur: Safety/medical concerns  Assist level: 2 helpers Assistive device: Walker-rolling Max distance: 10   Walk 10 feet activity   Assist  Walk 10 feet activity  did not occur: Safety/medical concerns  Assist level: 2 helpers Assistive device: Walker-rolling   Walk 50 feet activity   Assist Walk 50 feet with 2 turns activity did not occur: Safety/medical concerns         Walk 150 feet activity   Assist Walk 150 feet activity did not occur: Safety/medical concerns         Walk 10 feet on uneven surface  activity   Assist Walk 10 feet on uneven surfaces activity did not occur: Safety/medical concerns         Wheelchair     Assist Will patient use wheelchair at discharge?: Yes Type of Wheelchair: Manual    Wheelchair assist level: Supervision/Verbal cueing Max wheelchair distance: 100    Wheelchair 50 feet with 2 turns activity    Assist        Assist Level: Supervision/Verbal cueing   Wheelchair 150 feet activity     Assist      Assist Level: Minimal Assistance - Patient > 75%     Medical Problem List and Plan: 1.  Generalized weakness affecting ability to stand or complete ADL tasks secondary to debility.  Continue CIR  2.  Antithrombotics: -DVT/anticoagulation:  Mechanical: Sequential compression devices, below knee Bilateral lower extremities due to GI bleed.             -antiplatelet therapy: N/A 3. Pain Management: Tylenol prn.   Controlled with meds on 2/6 4. Mood: LCSW to follow for evaluation and support.              -antipsychotic agents: N/a 5. Neuropsych: This patient is capable of making decisions on his own behalf. 6. Skin/Wound Care: Routine pressure relief measures.   Discussed bedding and pressure relief for healing of sacral ulcer, discussed again 7. Fluids/Electrolytes/Nutrition: Strict I/Os.  Labs with HD.discussed 1284ml fluid restriction  8.  ESRD: On midodrine prior to HD to help support BP.   Permanent access as outpatient 9. Chronic systolic CHF/CAF: Strict I/Os. Fluid status managed with HD. On Amiodarone 400 mg bid for rate control.              Daily  weights  Lasix per Nephro, appreciate Recs Filed Weights   11/17/20 0500 11/18/20 1345 11/18/20 1640  Weight: 108.6 kg 116.3 kg 114 kg   ?  Reliability on 2/6  Monitor heart rate with increased activity 10.  Pancreatitis: Has completed antibiotic course. Tolerating diet without nausea, diarrhea or abdominal pain.              Monitor closely.  Tolerating diet at present 11. LGIB: On Carafate ac/hs.  12. Acute on chronic anemia: On aranresp weekly.              Labs with HD, transfused on 2/5  Hemoglobin 6.9 on 2/5 13. Thrombocytopenia: Monitor for signs of bleeding.              Labs with HD  Platelets 191 on 2/5 14. Acute respiratory failure: Has resolved. prednisone 40 mg daily  15.  Leukocytosis-some steroid  effect  WBCs 20.5 on 2/5, continue to monitor  Afebrile 16.  Steroid-induced hyperglycemia  SSI  Prednisone decreased to 20 on 2/4-continue to wean  Slightly elevated on 2/5  Continue to monitor  LOS: 9 days A FACE TO FACE EVALUATION WAS PERFORMED  Kavion Mancinas Lorie Phenix 11/19/2020, 1:48 PM

## 2020-11-20 LAB — GLUCOSE, CAPILLARY
Glucose-Capillary: 85 mg/dL (ref 70–99)
Glucose-Capillary: 111 mg/dL — ABNORMAL HIGH (ref 70–99)
Glucose-Capillary: 109 mg/dL — ABNORMAL HIGH (ref 70–99)
Glucose-Capillary: 104 mg/dL — ABNORMAL HIGH (ref 70–99)

## 2020-11-20 MED ORDER — CHLORHEXIDINE GLUCONATE CLOTH 2 % EX PADS
6.0000 | MEDICATED_PAD | Freq: Every day | CUTANEOUS | Status: DC
  Administered 2020-11-21 – 2020-11-24 (×3): 6 via TOPICAL

## 2020-11-20 NOTE — Progress Notes (Addendum)
[facility] NEPHROLOGY PROGRESS NOTE  Assessment/ Plan:  # Acute kidney Injury: With baseline creatinine of 1.2 and acute kidney injury likely from ATN associated with Covid infection/acute pancreatitis and shock.  Unfortunately, remains anuric/with minimal urine output recorded and to continue hemodialysis on a TTS schedule while here (last dialyzed 2/5).  He is suspected to have evolved into end-stage renal disease with dialysis dependent acute kidney injury for the past 6 weeks.  The plan is to pursue permanent access as an outpatient after he gets physically stronger. Next HD tomorrow.   # Acute pancreatitis: This was noted on CT scan earlier (1/3 and 1/7).  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  #  Acute hypoxic respiratory failure: Secondary to Covid pneumonia and possible aspiration pneumonitis.  Status post corticosteroids and supportive management-now on room air and without supplemental oxygen requirements.  #  Anemia: Secondary to acute/critical illness and GI losses.  Continue current dose of ESA.  IV iron prohibitive because of very high ferritin level.  Received PRBC.  #  Secondary hyperparathyroidism: Phosphorus level appears to be relatively well controlled with hemodialysis/current diet.  PTH level 217 at goal.  #Hypotension: On midodrine.  Blood pressure acceptable.  Subjective: Seen and examined.  No new event.  Denies nausea vomiting chest pain shortness of breath.  Working with physical therapy. Objective Vital signs in last 24 hours: Vitals:   11/19/20 1455 11/19/20 1954 11/20/20 0440 11/20/20 1353  BP: 114/60 126/69 114/61 125/61  Pulse: 82 66 67 78  Resp: 17 17 16 16   Temp: 97.8 F (36.6 C) 97.6 F (36.4 C) 98.2 F (36.8 C) 97.8 F (36.6 C)  TempSrc:  Oral    SpO2: 97% 100% 97% 99%  Weight:      Height:       Weight change:   Intake/Output Summary (Last 24 hours) at  11/20/2020 1412 Last data filed at 11/19/2020 1840 Gross per 24 hour  Intake 120 ml  Output --  Net 120 ml       Labs: Basic Metabolic Panel: Recent Labs  Lab 11/16/20 1313 11/16/20 1801 11/18/20 1403  NA 137 138 136  K 4.6 3.7 3.8  CL 98 98 95*  CO2 17* 23 16*  GLUCOSE 143* 163* 154*  BUN 83* 42* 82*  CREATININE 7.06* 4.52* 7.15*  CALCIUM 9.3 9.0 9.2  PHOS 5.8* 4.1 5.8*   Liver Function Tests: Recent Labs  Lab 11/16/20 1313 11/16/20 1801 11/18/20 1403  ALBUMIN 2.9* 3.3* 2.8*   No results for input(s): LIPASE, AMYLASE in the last 168 hours. No results for input(s): AMMONIA in the last 168 hours. CBC: Recent Labs  Lab 11/14/20 1323 11/16/20 1313 11/16/20 1801 11/18/20 1403  WBC 28.9* 26.1* 24.2* 20.5*  HGB 7.2* 7.4* 7.5* 6.9*  HCT 23.1* 24.0* 23.0* 22.7*  MCV 94.3 96.4 92.4 97.0  PLT 163 202 145* 191   Cardiac Enzymes: No results for input(s): CKTOTAL, CKMB, CKMBINDEX, TROPONINI in the last 168 hours. CBG: Recent Labs  Lab 11/19/20 1132 11/19/20 1640 11/19/20 2047 11/20/20 0555 11/20/20 1137  GLUCAP 95 129* 105* 85 111*    Iron Studies: No results for input(s): IRON, TIBC, TRANSFERRIN, FERRITIN in the last 72 hours. Studies/Results: No results found.  Medications: Infusions:   Scheduled Medications: . sodium chloride   Intravenous Once  . acetaminophen (TYLENOL) oral liquid 160 mg/5 mL  650 mg Oral Q6H  . amiodarone  400  mg Oral BID  . B-complex with vitamin C  1 tablet Oral Daily  . Chlorhexidine Gluconate Cloth  6 each Topical Q0600  . darbepoetin (ARANESP) injection - DIALYSIS  200 mcg Intravenous Q Tue-HD  . feeding supplement (NEPRO CARB STEADY)  237 mL Oral TID WC  . Gerhardt's butt cream   Topical TID  . insulin aspart  3-9 Units Subcutaneous TID WC  . lactobacillus acidophilus  2 tablet Oral TID  . lidocaine  1 patch Transdermal Q24H  . mouth rinse  15 mL Mouth Rinse BID  . midodrine  10 mg Oral TID WC  . predniSONE  20 mg Oral  Q breakfast  . sucralfate  1 g Oral TID WC & HS  . triamcinolone 0.1 % cream : eucerin   Topical BID    have reviewed scheduled and prn medications.  Physical Exam: General:NAD, comfortable Heart:RRR, s1s2 nl Lungs:clear b/l, no crackle Abdomen:soft, Non-tender, non-distended Extremities:No edema Dialysis Access: Right IJ TDC placed by IR on 1/24.  Site clean  Keajah Killough [employer] 11/20/2020,2:12 PM  LOS: 10 days

## 2020-11-20 NOTE — Progress Notes (Signed)
Physical Therapy Weekly Progress Note  Patient Details  Name: Raymond Hurley. MRN: [id number] Date of Birth: [birth year]  Beginning of progress report period: November 11, 2020 End of progress report period: November 20, 2020  Today's Date: 11/20/2020 PT Individual Time: 1000-1055 PT Individual Time Calculation (min): 55 min   Patient has met 2 of 5 short term goals. Pt demonstrates slow progress towards long term goals. Pt currently requires min/mod A for transfers with RW, min A +2 for close WC follow to ambulate 51f with RW, and supervision for WC mobility up to 1521f Pt is limited by poor endurance and SOB, generalized weakness and deconditioning, and poor activity tolerance. However, pt remains motivated to participate in therapy.   Patient continues to demonstrate the following deficits muscle weakness, decreased cardiorespiratoy endurance and decreased standing balance, decreased postural control and decreased balance strategies and therefore will continue to benefit from skilled PT intervention to increase functional independence with mobility.  Patient progressing toward long term goals..  Continue plan of care.  PT Short Term Goals Week 1:  PT Short Term Goal 1 (Week 1): Pt will trasnfer to WC with CGA and LRAD PT Short Term Goal 1 - Progress (Week 1): Progressing toward goal PT Short Term Goal 2 (Week 1): Pt will performed bed mobility with CGA PT Short Term Goal 2 - Progress (Week 1): Progressing toward goal PT Short Term Goal 3 (Week 1): Pt will ambulate 3052fith min assist and LRAD PT Short Term Goal 3 - Progress (Week 1): Progressing toward goal PT Short Term Goal 4 (Week 1): Pt will propell WC [id number] supervision assist PT Short Term Goal 4 - Progress (Week 1): Met PT Short Term Goal 5 (Week 1): pt will tolerate sitting up in WC >2 hours between therapies PT Short Term Goal 5 - Progress (Week 1): Met Week 2:  PT Short Term Goal 1 (Week 2): Pt will perform  stand<>pivot transfer with LRAD and CGA PT Short Term Goal 2 (Week 2): Pt will ambulate 30ft49fh LRAD and min A PT Short Term Goal 3 (Week 2): Pt will perform bed mobility with CGA consistantly  Skilled Therapeutic Interventions/Progress Updates:  Ambulation/gait training;Balance/vestibular training;Community reintegration;Cognitive remediation/compensation;Discharge planning;Disease management/prevention;DME/adaptive equipment instruction;Functional mobility training;Neuromuscular re-education;Patient/family education;Pain management;Psychosocial support;Skin care/wound management;Splinting/orthotics;Stair training;Therapeutic Exercise;UE/LE Coordination activities;Visual/perceptual remediation/compensation;Wheelchair propulsion/positioning;UE/LE Strength taining/ROM;Therapeutic Activities   Today's Interventions: Received pt sitting in WC, pt agreeable to therapy, and denied any pain during session. Session with emphasis on functional mobility/transfers, generalized strengthening, dynamic standing balance/coordination, ambulation, and improved activity tolerance. Donned bilateral ted hose and non-skid socks with total A and pt performed WC mobility 150ft 37fg BUE and supervision to therapy gym. Pt transferred sit<>stand with RW and min A and ambulated 11ft w22fRW and min A +2 for WC follow. Pt reported increased dizziness when ambulating that resolved with seated rest break. Pt transferred stand<>pivot WC<>mat with RW and min A. Pt required numerous seated rest breaks throughout session due to increased fatigue and SOB. Pt transferred sit<>stand with RW and min/mod A with fatigue x 3 reps and performed the following exercises standing with BUE support and CGA for balance: -heel raises x8 -alternating marches x21 -mini-squats x8 Pt then performed the following exercises sitting on mat with supervision and verbal cues for technique: -bicep curls with 5lb dumbbell x8 on LUE and x12 on RUE -seated  trunk rotation with unweighted ball x15 bilaterally Pt transferred mat<>WC stand<>pivot with RW and min A and transported back to  room in [facility] total A. Concluded session with pt sitting in WC, needs within reach, and seatbelt alarm on.   Therapy Documentation Precautions:  Precautions Precautions: Fall,Other (comment) Precaution Comments: Bowel incontinence (apparently had flexiseal removed yesterday) Restrictions Weight Bearing Restrictions: No  Therapy/Group: Individual Therapy Alfonse Alpers PT, DPT  11/20/2020, 7:38 AM

## 2020-11-20 NOTE — Progress Notes (Signed)
Occupational Therapy Session Note  Patient Details  Name: Raymond Hurley. MRN: [id number] Date of Birth: 06-Jul-1931  Today's Date: 11/20/2020 OT Individual Time: 5038-8828 and 1100-1200 OT Individual Time Calculation (min): 60 min and 60 min   Short Term Goals: Week 2:  OT Short Term Goal 1 (Week 2): Pt will transfer to toilet with MIN A consistently OT Short Term Goal 2 (Week 2): Pt will stand to complete 1 grooming task to demo improved activity tolerance OT Short Term Goal 3 (Week 2): Pt will complete 2/3 toileting steps consistently OT Short Term Goal 4 (Week 2): Pt will don socks/shoes with min assist with AE PRN  Skilled Therapeutic Interventions/Progress Updates:    1) Treatment session with focus on functional transfers, sit > stand, and dynamic standing balance.  Pt received semi-reclined in bed finishing breakfast.  Pt agreeable to therapy session.  Pt completed bed mobility with mod assist to come to sitting at EOB.  Completed sit > stand from elevated EOB with min assist.  Pt able to complete stand pivot transfer to w/c with RW with min assist.  Pt engaged in grooming tasks seated at sink, completing oral care, washing face, and brushing hair.  Pt reports need to toilet.  Completed stand pivot transfer w/c > BSC over toilet with use of grab bar.  Pt required min assist transfer to toilet and mod assist for sit > stand post toileting due to prolonged sitting on BSC.  Pt tolerated standing 20-30 seconds to allow therapist to complete hygiene.  Pt required return to sitting prior to completing clothing management.  Pt unable to maintain standing balance with single UE support to pull up pants, therefore therapist pulled up pants this session.  Pt returned to w/c min assist.  Returned to sink to wash hands.  Pt then remained upright in w/c with seat belt alarm on and all needs in reach.  2) Treatment session with focus on LB dressing with AE and d/c planning. Pt received upright in w/c  reporting fatigue from just completing PT session.  Discussed LB dressing with AE, pt in agreement to attempting donning/doffing socks. Pt able to doff socks with use of reacher with increased time and cues for problem solving.  Therapist re-educated on use of sock aid as pt with minimal carryover from previous session.  Pt required mod cues for use and sequencing of sock aid.  Pt able to don with increased time and cues to continue pulling on strings.  Pt reports that his wife sent pictures of him home bathroom, bedroom, and doorways.  Pt also has home measurement sheet filled out by wife.  Discussed picking up throw rugs to minimal fall risk with RW with pt in agreement.  Discussed use of BSC over toilet due to low toilet seat height.  Therapist encouraged pt to ask wife to measure space between his side of bed and bookshelf to assess if RW could fit through that space.  Pt remained upright in w/c with seat belt alarm on and all needs in reach.  Therapy Documentation Precautions:  Precautions Precautions: Fall,Other (comment) Precaution Comments: Bowel incontinence (apparently had flexiseal removed yesterday) Restrictions Weight Bearing Restrictions: No Pain:   Pt with no c/o pain in 1st or 2nd OT session.  Therapy/Group: Individual Therapy  [name] 11/20/2020, 12:06 PM

## 2020-11-20 NOTE — Plan of Care (Signed)
  Problem: Consults Goal: RH GENERAL PATIENT EDUCATION Description: See Patient Education module for education specifics. Outcome: Progressing Goal: Skin Care Protocol Initiated - if Braden Score 18 or less Description: If consults are not indicated, leave blank or document N/A Outcome: Progressing   Problem: RH BOWEL ELIMINATION Goal: RH STG MANAGE BOWEL WITH ASSISTANCE Description: STG Manage Bowel with mod I Assistance. Outcome: Progressing   Problem: RH BLADDER ELIMINATION Goal: RH STG MANAGE BLADDER WITH ASSISTANCE Description: STG Manage Bladder With mod I Assistance Outcome: Progressing   Problem: RH SKIN INTEGRITY Goal: RH STG SKIN FREE OF INFECTION/BREAKDOWN Description: Breakdown to skin will improve according to weekly measurements with min assist Outcome: Progressing Goal: RH STG MAINTAIN SKIN INTEGRITY WITH ASSISTANCE Description: STG Maintain Skin Integrity With min Assistance. Outcome: Progressing Goal: RH STG ABLE TO PERFORM INCISION/WOUND CARE W/ASSISTANCE Description: STG Able To Perform Incision/Wound Care With min Assistance. Outcome: Progressing   Problem: RH SAFETY Goal: RH STG ADHERE TO SAFETY PRECAUTIONS W/ASSISTANCE/DEVICE Description: STG Adhere to Safety Precautions With cues/reminders  Assistance/Device. Outcome: Progressing   Problem: RH PAIN MANAGEMENT Goal: RH STG PAIN MANAGED AT OR BELOW PT'S PAIN GOAL Description: Pain will be managed at or below 3 out of 10 on pain scale with min assist Outcome: Progressing   Problem: RH KNOWLEDGE DEFICIT GENERAL Goal: RH STG INCREASE KNOWLEDGE OF SELF CARE AFTER HOSPITALIZATION Description: Pt will increase knowledge of self care with min assit from materials provided to pt by nursing and therapy Outcome: Progressing

## 2020-11-21 LAB — CBC
HCT: 22 % — ABNORMAL LOW (ref 39.0–52.0)
Hemoglobin: 7.2 g/dL — ABNORMAL LOW (ref 13.0–17.0)
MCH: 30.5 pg (ref 26.0–34.0)
MCHC: 32.7 g/dL (ref 30.0–36.0)
MCV: 93.2 fL (ref 80.0–100.0)
Platelets: 178 10*3/uL (ref 150–400)
RBC: 2.36 MIL/uL — ABNORMAL LOW (ref 4.22–5.81)
RDW: 25.2 % — ABNORMAL HIGH (ref 11.5–15.5)
WBC: 18.5 10*3/uL — ABNORMAL HIGH (ref 4.0–10.5)
nRBC: 0 % (ref 0.0–0.2)

## 2020-11-21 LAB — GLUCOSE, CAPILLARY
Glucose-Capillary: 82 mg/dL (ref 70–99)
Glucose-Capillary: 98 mg/dL (ref 70–99)
Glucose-Capillary: 96 mg/dL (ref 70–99)
Glucose-Capillary: 112 mg/dL — ABNORMAL HIGH (ref 70–99)

## 2020-11-21 LAB — RENAL FUNCTION PANEL
Albumin: 2.7 g/dL — ABNORMAL LOW (ref 3.5–5.0)
Anion gap: 23 — ABNORMAL HIGH (ref 5–15)
BUN: 100 mg/dL — ABNORMAL HIGH (ref 8–23)
CO2: 17 mmol/L — ABNORMAL LOW (ref 22–32)
Calcium: 9.5 mg/dL (ref 8.9–10.3)
Chloride: 96 mmol/L — ABNORMAL LOW (ref 98–111)
Creatinine, Ser: 7.86 mg/dL — ABNORMAL HIGH (ref 0.61–1.24)
GFR, Estimated: 6 mL/min — ABNORMAL LOW (ref >60)
Glucose, Bld: 86 mg/dL (ref 70–99)
Phosphorus: 6 mg/dL — ABNORMAL HIGH (ref 2.5–4.6)
Potassium: 4.3 mmol/L (ref 3.5–5.1)
Sodium: 136 mmol/L (ref 135–145)

## 2020-11-21 LAB — HEPATITIS B SURFACE ANTIGEN: Hepatitis B Surface Ag: NONREACTIVE

## 2020-11-21 MED ORDER — PREDNISONE 5 MG PO TABS
10.0000 mg | ORAL_TABLET | Freq: Every day | ORAL | Status: DC
  Administered 2020-11-22 – 2020-11-27 (×6): 10 mg via ORAL
  Filled 2020-11-21 (×6): qty 2

## 2020-11-21 MED ORDER — SEVELAMER CARBONATE 800 MG PO TABS
800.0000 mg | ORAL_TABLET | Freq: Three times a day (TID) | ORAL | Status: DC
  Administered 2020-11-21 – 2020-11-27 (×17): 800 mg via ORAL
  Filled 2020-11-21 (×18): qty 1

## 2020-11-21 MED ORDER — DARBEPOETIN ALFA 200 MCG/0.4ML IJ SOSY
PREFILLED_SYRINGE | INTRAMUSCULAR | Status: AC
  Filled 2020-11-21: qty 0.4

## 2020-11-21 MED ORDER — HEPARIN SODIUM (PORCINE) 1000 UNIT/ML IJ SOLN
INTRAMUSCULAR | Status: AC
  Administered 2020-11-21: 3200 [IU]
  Filled 2020-11-21: qty 3

## 2020-11-21 NOTE — Progress Notes (Signed)
[place] PHYSICAL MEDICINE & REHABILITATION PROGRESS NOTE   Subjective/Complaints: Patient seen sitting up at the EOB this AM, working with therapies.  He states he slept well overnight.  He notes he had a good BM yesterday.  He notes improvement in edema  He also states he has been offloading more. He was seen by Nephro yesterday, notes reviewed, no changes.  ROS: Denies CP, SOB, N/V/D  Objective:   No results found. Recent Labs    11/18/20 1403 11/21/20 0503  WBC 20.5* 18.5*  HGB 6.9* 7.2*  HCT 22.7* 22.0*  PLT 191 178   Recent Labs    11/18/20 1403 11/21/20 0503  NA 136 136  K 3.8 4.3  CL 95* 96*  CO2 16* 17*  GLUCOSE 154* 86  BUN 82* 100*  CREATININE 7.15* 7.86*  CALCIUM 9.2 9.5    Intake/Output Summary (Last 24 hours) at 11/21/2020 1130 Last data filed at 11/20/2020 1900 Gross per 24 hour  Intake 120 ml  Output --  Net 120 ml     Pressure Injury 11/10/20 Buttocks Right;Mid Unstageable - Full thickness tissue loss in which the base of the injury is covered by slough (yellow, tan, gray, green or brown) and/or eschar (tan, brown or black) in the wound bed. red open area of partial s (Active)  11/10/20 1721  Location: Buttocks  Location Orientation: Right;Mid  Staging: Unstageable - Full thickness tissue loss in which the base of the injury is covered by slough (yellow, tan, gray, green or brown) and/or eschar (tan, brown or black) in the wound bed.  Wound Description (Comments): red open area of partial skin loss with eschar covering wound bed  Present on Admission: Yes    Physical Exam: Vital Signs Blood pressure 117/60, pulse 63, temperature 97.7 F (36.5 C), temperature source Oral, resp. rate 18, height 5\' 10"  (1.778 m), weight 114 kg, SpO2 97 %.  Constitutional: No distress . Vital signs reviewed. HENT: Normocephalic.  Atraumatic. Eyes: EOMI. No discharge. Cardiovascular: No JVD.   Respiratory: Normal effort.  No stridor.   GI: Non-distended.    Skin: Warm and dry.   Gluteal ulcer not examined today Psych: Normal mood.  Normal behavior. Musc:  Edema b/l feet, improving No tenderness in extremities. Neuro: Alert Motor: Bilateral upper extremities: 4+/5 throughout, unchanged Left lower extremity: Hip flexion, knee extension 4-/5, ankle dorsiflexion 4+/5, stable Right lower extremity: Hip flexion, knee extension 3 +/5, ankle dorsiflexion 4+/5   Assessment/Plan: 1. Functional deficits which require 3+ hours per day of interdisciplinary therapy in a comprehensive inpatient rehab setting.  Physiatrist is providing close team supervision and 24 hour management of active medical problems listed below.  Physiatrist and rehab team continue to assess barriers to discharge/monitor patient progress toward functional and medical goals  Care Tool:  Bathing    Body parts bathed by patient: Right arm,Left arm,Chest,Abdomen,Face   Body parts bathed by helper: Buttocks,Right lower leg,Left lower leg     Bathing assist Assist Level: Supervision/Verbal cueing     Upper Body Dressing/Undressing Upper body dressing   What is the patient wearing?: Pull over shirt,Button up shirt    Upper body assist Assist Level: Supervision/Verbal cueing    Lower Body Dressing/Undressing Lower body dressing      What is the patient wearing?: Pants     Lower body assist Assist for lower body dressing: Minimal Assistance - Patient > 75%     Toileting Toileting    Toileting assist Assist for toileting: Maximal Assistance -  Patient 25 - 49%     Transfers Chair/bed transfer  Transfers assist     Chair/bed transfer assist level: Minimal Assistance - Patient > 75%     Locomotion Ambulation   Ambulation assist   Ambulation activity did not occur: Safety/medical concerns  Assist level: 2 helpers Assistive device: Walker-rolling Max distance: 64ft   Walk 10 feet activity   Assist  Walk 10 feet activity did not occur:  Safety/medical concerns  Assist level: 2 helpers Assistive device: Walker-rolling   Walk 50 feet activity   Assist Walk 50 feet with 2 turns activity did not occur: Safety/medical concerns         Walk 150 feet activity   Assist Walk 150 feet activity did not occur: Safety/medical concerns         Walk 10 feet on uneven surface  activity   Assist Walk 10 feet on uneven surfaces activity did not occur: Safety/medical concerns         Wheelchair     Assist Will patient use wheelchair at discharge?: Yes Type of Wheelchair: Manual    Wheelchair assist level: Supervision/Verbal cueing Max wheelchair distance: 140ft    Wheelchair 50 feet with 2 turns activity    Assist        Assist Level: Supervision/Verbal cueing   Wheelchair 150 feet activity     Assist      Assist Level: Supervision/Verbal cueing     Medical Problem List and Plan: 1.  Generalized weakness affecting ability to stand or complete ADL tasks secondary to debility.  Continue CIR  2.  Antithrombotics: -DVT/anticoagulation:  Mechanical: Sequential compression devices, below knee Bilateral lower extremities due to GI bleed.             -antiplatelet therapy: N/A 3. Pain Management: Tylenol prn.   Controlled with meds on 2/8 4. Mood: LCSW to follow for evaluation and support.              -antipsychotic agents: N/a 5. Neuropsych: This patient is capable of making decisions on his own behalf. 6. Skin/Wound Care: Routine pressure relief measures.   Discussed bedding and pressure relief for healing of sacral ulcer, discussed again, improving 7. Fluids/Electrolytes/Nutrition: Strict I/Os.  Labs with HD.discussed 1231ml fluid restriction  8. ESRD: On midodrine prior to HD to help support BP.   Permanent access as outpatient 9. Chronic systolic CHF/CAF: Strict I/Os. Fluid status managed with HD. On Amiodarone 400 mg bid for rate control.              Daily weights  Lasix per  Nephro, appreciate Recs Filed Weights   11/17/20 0500 11/18/20 1345 11/18/20 1640  Weight: 108.6 kg 116.3 kg 114 kg   ?  Reliability on 2/8  Monitor heart rate with increased activity 10.  Pancreatitis: Has completed antibiotic course. Tolerating diet without nausea, diarrhea or abdominal pain.              Monitor closely.  Tolerating diet at present 11. LGIB: On Carafate ac/hs.  12. Acute on chronic anemia: On aranresp weekly.              Labs with HD, transfused on 2/5  Hemoglobin 7.2 on 2/8 13. Thrombocytopenia: Monitor for signs of bleeding.              Labs with HD  Resolved 14. Acute respiratory failure: Has resolved. prednisone 40 mg daily  15.  Leukocytosis-some steroid effect  WBCs 18.5 on 2/8, continue to  monitor  Afebrile 16.  Steroid-induced hyperglycemia  SSI  Prednisone decreased to 20 on 2/4, decreased to 10 on 2/9  Controlled on 2/8  Continue to monitor  LOS: 11 days A FACE TO FACE EVALUATION WAS PERFORMED  Jameka Ivie Lorie Phenix 11/21/2020, 11:30 AM

## 2020-11-21 NOTE — Progress Notes (Signed)
Patient returned from dialysis this shift, cbg taken, vital signs and patient set up for meals. Patient denies pain. Will continue monitor

## 2020-11-21 NOTE — Progress Notes (Signed)
Occupational Therapy Session Note  Patient Details  Name: Raymond Hurley. MRN: [id number] Date of Birth: 07/18/1931  Today's Date: 11/21/2020 OT Individual Time: 6578-4696 and 2952-8413 OT Individual Time Calculation (min): 60 min and 55 min   Short Term Goals: Week 2:  OT Short Term Goal 1 (Week 2): Pt will transfer to toilet with MIN A consistently OT Short Term Goal 2 (Week 2): Pt will stand to complete 1 grooming task to demo improved activity tolerance OT Short Term Goal 3 (Week 2): Pt will complete 2/3 toileting steps consistently OT Short Term Goal 4 (Week 2): Pt will don socks/shoes with min assist with AE PRN  Skilled Therapeutic Interventions/Progress Updates:    1) Treatment session with focus on self-care retraining and functional transfers.  Pt received semi-reclined in bed finishing breakfast.  Pt agreeable to therapy session this AM.  Pt completed mobility to come to sitting EOB with CGA with improved sequencing and weight shifting.  Pt completed sit > stand from EOB with mod assist.  Completed stand pivot transfer bed > w/c with min assist with RW.  Pt engaged in UB bathing, grooming, and oral care seated at sink with setup for items.  Pt able to change shirt and don jacket with increased time.  Therapist donned TEDS with pt stating that he is doubtful that he will be able to complete and that his wife is definitely not able to physically assist him at all.  Pt remained upright in w/c with seat belt alarm on and all needs in reach.  2) Treatment session with focus on LB dressing and sit > stand.  Pt received upright in w/c reporting fatigue from previous sessions but agreeable to therapy session.  Discussed use of plastic bag for foot to decrease resistance when donning TEDS, therapist demonstrating and pt pleased with ease of task.  However still stating that neither he nor his wife will be able to don his TEDS.  Pt donned sock with sock aid with increased time to apply sock  to sock aid.  Engaged in sit > stand x3 with pt standing 15, 30, and 37 seconds before reporting dizziness and need to sit.  Pt required 2-3 mins rest break between each stand. After 3rd stand, pt reports having no further energy.  Discussed home setup per discussion with PT.  Pt's wife called towards end of session.  See below for details of conversation. Pt remained upright in w/c with seat belt alarm on and all needs in reach.  Pt's wife very adamant about pt needing to stay longer than the established date.  Pt's wife even stating that Medicare will cover up to 100 days and she will continue to advocate that he get as much as he needs.  Discussed pt current progress towards goals, typical CIR LOS, and recommend that wife bring up her concerns with MD or SWK.    Therapy Documentation Precautions:  Precautions Precautions: Fall,Other (comment) Precaution Comments: Bowel incontinence (apparently had flexiseal removed yesterday) Restrictions Weight Bearing Restrictions: No General:   Vital Signs: Therapy Vitals Temp: 97.7 F (36.5 C) Temp Source: Oral Pulse Rate: 63 Resp: 18 BP: 117/60 Patient Position (if appropriate): Lying Oxygen Therapy SpO2: 97 % O2 Device: Room Air Pain: Pain Assessment Pain Scale: 0-10 Pain Score: 0-No pain   Therapy/Group: Individual Therapy  [name] 11/21/2020, 9:16 AM

## 2020-11-21 NOTE — Progress Notes (Signed)
Physical Therapy Session Note  Patient Details  Name: Raymond Hurley. MRN: [id number] Date of Birth: 01-Apr-1931  Today's Date: 11/21/2020 PT Individual Time: 1000-1054 PT Individual Time Calculation (min): 54 min   Short Term Goals: Week 1:  PT Short Term Goal 1 (Week 1): Pt will trasnfer to WC with CGA and LRAD PT Short Term Goal 1 - Progress (Week 1): Progressing toward goal PT Short Term Goal 2 (Week 1): Pt will performed bed mobility with CGA PT Short Term Goal 2 - Progress (Week 1): Progressing toward goal PT Short Term Goal 3 (Week 1): Pt will ambulate 68f with min assist and LRAD PT Short Term Goal 3 - Progress (Week 1): Progressing toward goal PT Short Term Goal 4 (Week 1): Pt will propell WC 1559fwith supervision assist PT Short Term Goal 4 - Progress (Week 1): Met PT Short Term Goal 5 (Week 1): pt will tolerate sitting up in WC >2 hours between therapies PT Short Term Goal 5 - Progress (Week 1): Met Week 2:  PT Short Term Goal 1 (Week 2): Pt will perform stand<>pivot transfer with LRAD and CGA PT Short Term Goal 2 (Week 2): Pt will ambulate 3075fith LRAD and min A PT Short Term Goal 3 (Week 2): Pt will perform bed mobility with CGA consistantly  Skilled Therapeutic Interventions/Progress Updates:   Received pt sitting in WC, pt agreeable to therapy, and denied any pain during session. Session with emphasis on functional mobility/transfers, generalized strengthening, dynamic standing balance/coordination, ambulation, and improved activity tolerance. Pt [facility] therapy gym [facility] for time management purposes. Discussed home entry and pt reported he has 1 curb to enter home through the back door. Discussed ramp options and pt reported he has someone who could build a ramp for him. Went through pictures of home setup including doorways and curbs. Pt with questions regarding tub transfers; encouraged pt to speak in further detail with OT. Pt transferred sit<>stand  with RW and min A and ambulated [id number] trial and 23ft36f trial with RW and CGA/min A +2 for close WC follow. Pt reported dizziness that resolved with seated rest break. Pt continues to demonstrate flexed trunk, increased WB through BUEs, narrow BOS, decreased foot clearance, and decreased trunk rotation. Nephology MD present for brief assessment. [facility] and performed seated BLE strengthening on Kinetron at 20 cm/sec for 1 minute x 3 trials with emphasis on glute/quad strengthening. Noted pt with improved endurance reporting less SOB and requiring shorter rest breaks during session. While pt performed LE strengthening on Kinetron therapist provided pt with options for non-slip outdoor rubber mats for pt to order to place at top of ramp. Pt reported 6/10 fatigue after activity. Pt performed WC mobility 150ft 74fg BUE and supervision back to room. Concluded session with pt sitting in WC, needs within reach, and seatbelt alarm on.   Therapy Documentation Precautions:  Precautions Precautions: Fall,Other (comment) Precaution Comments: Bowel incontinence (apparently had flexiseal removed yesterday) Restrictions Weight Bearing Restrictions: No  Therapy/Group: Individual Therapy Lilyonna Steidle MAlfonse AlpersPT   11/21/2020, 7:25 AM

## 2020-11-21 NOTE — Progress Notes (Signed)
[facility] NEPHROLOGY PROGRESS NOTE  Assessment/ Plan:  # Acute kidney Injury: With baseline creatinine of 1.2 and acute kidney injury likely from ATN associated with Covid infection/acute pancreatitis and shock.  Unfortunately, remains anuric/with minimal urine output recorded and to continue hemodialysis on a TTS schedule while here (last dialyzed 2/5).  He is suspected to have evolved into end-stage renal disease with dialysis dependent acute kidney injury for the past 6 weeks.  The plan is to pursue permanent access as an outpatient after he gets physically stronger. Plan for HD today.  # Acute pancreatitis: This was noted on CT scan earlier (1/3 and 1/7).  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  #  Acute hypoxic respiratory failure: Secondary to Covid pneumonia and possible aspiration pneumonitis.  Status post corticosteroids and supportive management-now on room air and without supplemental oxygen requirements.  #  Anemia: Secondary to acute/critical illness and GI losses.  Continue current dose of ESA.  IV iron prohibitive because of very high ferritin level.  Received PRBC.  #  Secondary hyperparathyroidism: Phosphorus level elevated, start sevelamer.  PTH level 217 at goal.  #Hypotension: On midodrine.  Blood pressure acceptable.  Subjective: Seen and examined. Denies nausea vomiting chest pain shortness of breath.  Working with physical therapy. No new event.  Objective Vital signs in last 24 hours: Vitals:   11/20/20 0440 11/20/20 1353 11/20/20 1954 11/21/20 0554  BP: 114/61 125/61 (!) 131/56 117/60  Pulse: 67 78 79 63  Resp: 16 16 19 18   Temp: 98.2 F (36.8 C) 97.8 F (36.6 C) 97.7 F (36.5 C) 97.7 F (36.5 C)  TempSrc:   Oral Oral  SpO2: 97% 99% 95% 97%  Weight:      Height:       Weight change:   Intake/Output Summary (Last 24 hours) at 11/21/2020 1052 Last data filed at  11/20/2020 1900 Gross per 24 hour  Intake 120 ml  Output -  Net 120 ml       Labs: Basic Metabolic Panel: Recent Labs  Lab 11/16/20 1801 11/18/20 1403 11/21/20 0503  NA 138 136 136  K 3.7 3.8 4.3  CL 98 95* 96*  CO2 23 16* 17*  GLUCOSE 163* 154* 86  BUN 42* 82* 100*  CREATININE 4.52* 7.15* 7.86*  CALCIUM 9.0 9.2 9.5  PHOS 4.1 5.8* 6.0*   Liver Function Tests: Recent Labs  Lab 11/16/20 1801 11/18/20 1403 11/21/20 0503  ALBUMIN 3.3* 2.8* 2.7*   No results for input(s): LIPASE, AMYLASE in the last 168 hours. No results for input(s): AMMONIA in the last 168 hours. CBC: Recent Labs  Lab 11/14/20 1323 11/16/20 1313 11/16/20 1801 11/18/20 1403 11/21/20 0503  WBC 28.9* 26.1* 24.2* 20.5* 18.5*  HGB 7.2* 7.4* 7.5* 6.9* 7.2*  HCT 23.1* 24.0* 23.0* 22.7* 22.0*  MCV 94.3 96.4 92.4 97.0 93.2  PLT 163 202 145* 191 178   Cardiac Enzymes: No results for input(s): CKTOTAL, CKMB, CKMBINDEX, TROPONINI in the last 168 hours. CBG: Recent Labs  Lab 11/20/20 0555 11/20/20 1137 11/20/20 1658 11/20/20 2101 11/21/20 0548  GLUCAP 85 111* 109* 104* 82    Iron Studies: No results for input(s): IRON, TIBC, TRANSFERRIN, FERRITIN in the last 72 hours. Studies/Results: No results found.  Medications: Infusions:   Scheduled Medications: . sodium chloride   Intravenous Once  . acetaminophen (TYLENOL) oral liquid 160 mg/5 mL  650 mg Oral Q6H  . amiodarone  400 mg Oral BID  . B-complex with vitamin C  1 tablet Oral Daily  . Chlorhexidine Gluconate Cloth  6 each Topical Q0600  . Chlorhexidine Gluconate Cloth  6 each Topical Q0600  . darbepoetin (ARANESP) injection - DIALYSIS  200 mcg Intravenous Q Tue-HD  . feeding supplement (NEPRO CARB STEADY)  237 mL Oral TID WC  . Gerhardt's butt cream   Topical TID  . insulin aspart  3-9 Units Subcutaneous TID WC  . lactobacillus acidophilus  2 tablet Oral TID  . lidocaine  1 patch Transdermal Q24H  . mouth rinse  15 mL Mouth Rinse  BID  . midodrine  10 mg Oral TID WC  . predniSONE  20 mg Oral Q breakfast  . sucralfate  1 g Oral TID WC & HS  . triamcinolone 0.1 % cream : eucerin   Topical BID    have reviewed scheduled and prn medications.  Physical Exam: General:NAD, comfortable Heart:RRR, s1s2 nl Lungs:clear b/l, no crackle Abdomen:soft, Non-tender, non-distended Extremities:No edema Dialysis Access: Right IJ TDC placed by IR on 1/24.  Site clean  Raymond Hurley [employer] 11/21/2020,10:52 AM  LOS: 11 days

## 2020-11-22 DIAGNOSIS — J9601 Acute respiratory failure with hypoxia: Secondary | ICD-10-CM

## 2020-11-22 LAB — GLUCOSE, CAPILLARY
Glucose-Capillary: 79 mg/dL (ref 70–99)
Glucose-Capillary: 123 mg/dL — ABNORMAL HIGH (ref 70–99)
Glucose-Capillary: 113 mg/dL — ABNORMAL HIGH (ref 70–99)
Glucose-Capillary: 107 mg/dL — ABNORMAL HIGH (ref 70–99)

## 2020-11-22 MED ORDER — CHLORHEXIDINE GLUCONATE CLOTH 2 % EX PADS
6.0000 | MEDICATED_PAD | Freq: Every day | CUTANEOUS | Status: DC
  Administered 2020-11-24: 6 via TOPICAL

## 2020-11-22 NOTE — Progress Notes (Signed)
[place] PHYSICAL MEDICINE & REHABILITATION PROGRESS NOTE   Subjective/Complaints: Patient seen sitting up in his chair this AM.  He states he slept well overnight.  He states he needs to be ind prior to d/c because his wife cannot assist and they will be alone.   ROS: Denies CP, SOB, N/V/D  Objective:   No results found. Recent Labs    11/21/20 0503  WBC 18.5*  HGB 7.2*  HCT 22.0*  PLT 178   Recent Labs    11/21/20 0503  NA 136  K 4.3  CL 96*  CO2 17*  GLUCOSE 86  BUN 100*  CREATININE 7.86*  CALCIUM 9.5    Intake/Output Summary (Last 24 hours) at 11/22/2020 1031 Last data filed at 11/22/2020 0814 Gross per 24 hour  Intake 120 ml  Output 1836 ml  Net -1716 ml     Pressure Injury 11/10/20 Buttocks Right;Mid Unstageable - Full thickness tissue loss in which the base of the injury is covered by slough (yellow, tan, gray, green or brown) and/or eschar (tan, brown or black) in the wound bed. red open area of partial s (Active)  11/10/20 1721  Location: Buttocks  Location Orientation: Right;Mid  Staging: Unstageable - Full thickness tissue loss in which the base of the injury is covered by slough (yellow, tan, gray, green or brown) and/or eschar (tan, brown or black) in the wound bed.  Wound Description (Comments): red open area of partial skin loss with eschar covering wound bed  Present on Admission: Yes    Physical Exam: Vital Signs Blood pressure 112/60, pulse 75, temperature (!) 97.5 F (36.4 C), resp. rate 16, height 5\' 10"  (1.778 m), weight 79.5 kg, SpO2 97 %.  Constitutional: No distress . Vital signs reviewed. HENT: Normocephalic.  Atraumatic. Eyes: EOMI. No discharge. Cardiovascular: No JVD.  RRR. Respiratory: Normal effort.  No stridor.  Bilateral clear to auscultation. GI: Non-distended.  BS +. Skin: Warm and dry.  Intact. Psych: Normal mood.  Normal behavior. Musc:  Edema b/l feet, stable No tenderness in extremities. Neuro: Alert Motor:  Bilateral upper extremities: 4+/5 throughout, unchanged Left lower extremity: Hip flexion, knee extension 4-/5, ankle dorsiflexion 4+/5, unchanged Right lower extremity: Hip flexion, knee extension 3 +/5, ankle dorsiflexion 4+/5, unchanged  Assessment/Plan: 1. Functional deficits which require 3+ hours per day of interdisciplinary therapy in a comprehensive inpatient rehab setting.  Physiatrist is providing close team supervision and 24 hour management of active medical problems listed below.  Physiatrist and rehab team continue to assess barriers to discharge/monitor patient progress toward functional and medical goals  Care Tool:  Bathing    Body parts bathed by patient: Right arm,Left arm,Chest,Abdomen,Face   Body parts bathed by helper: Buttocks,Right lower leg,Left lower leg     Bathing assist Assist Level: Supervision/Verbal cueing     Upper Body Dressing/Undressing Upper body dressing   What is the patient wearing?: Pull over shirt,Button up shirt    Upper body assist Assist Level: Supervision/Verbal cueing    Lower Body Dressing/Undressing Lower body dressing      What is the patient wearing?: Pants     Lower body assist Assist for lower body dressing: Minimal Assistance - Patient > 75%     Toileting Toileting    Toileting assist Assist for toileting: Maximal Assistance - Patient 25 - 49%     Transfers Chair/bed transfer  Transfers assist     Chair/bed transfer assist level: Minimal Assistance - Patient > 75%  Locomotion Ambulation   Ambulation assist   Ambulation activity did not occur: Safety/medical concerns  Assist level: 2 helpers Assistive device: Walker-rolling Max distance: 34ft   Walk 10 feet activity   Assist  Walk 10 feet activity did not occur: Safety/medical concerns  Assist level: 2 helpers Assistive device: Walker-rolling   Walk 50 feet activity   Assist Walk 50 feet with 2 turns activity did not occur:  Safety/medical concerns         Walk 150 feet activity   Assist Walk 150 feet activity did not occur: Safety/medical concerns         Walk 10 feet on uneven surface  activity   Assist Walk 10 feet on uneven surfaces activity did not occur: Safety/medical concerns         Wheelchair     Assist Will patient use wheelchair at discharge?: Yes Type of Wheelchair: Manual    Wheelchair assist level: Supervision/Verbal cueing Max wheelchair distance: 148ft    Wheelchair 50 feet with 2 turns activity    Assist        Assist Level: Supervision/Verbal cueing   Wheelchair 150 feet activity     Assist      Assist Level: Supervision/Verbal cueing     Medical Problem List and Plan: 1.  Generalized weakness affecting ability to stand or complete ADL tasks secondary to debility.  Continue CIR   Team conference today to discuss current and goals and coordination of care, home and environmental barriers, and discharge planning with nursing, case manager, and therapies. Please see conference note from today as well.  2.  Antithrombotics: -DVT/anticoagulation:  Mechanical: Sequential compression devices, below knee Bilateral lower extremities due to GI bleed.             -antiplatelet therapy: N/A 3. Pain Management: Tylenol prn.   Controlled with meds on 2/9 4. Mood: LCSW to follow for evaluation and support.              -antipsychotic agents: N/a 5. Neuropsych: This patient is capable of making decisions on his own behalf. 6. Skin/Wound Care: Routine pressure relief measures.   Discussed bedding and pressure relief for healing of sacral ulcer, discussed again, improving 7. Fluids/Electrolytes/Nutrition: Strict I/Os.  Labs with HD.discussed 1240ml fluid restriction  8. ESRD: On midodrine prior to HD to help support BP.   Permanent access as outpatient 9. Chronic systolic CHF/CAF: Strict I/Os. Fluid status managed with HD. On Amiodarone 400 mg bid for rate  control.              Daily weights  Lasix per Nephro, appreciate Recs Filed Weights   11/18/20 1640 11/21/20 1821 11/22/20 0500  Weight: 114 kg 96.7 kg 79.5 kg   ?  Reliability on 2/9  Monitor heart rate with increased activity 10.  Pancreatitis: Has completed antibiotic course. Tolerating diet without nausea, diarrhea or abdominal pain.              Monitor closely.  Tolerating diet at present 11. LGIB: On Carafate ac/hs.  12. Acute on chronic anemia: On aranresp weekly.              Labs with HD, transfused on 2/5  Hemoglobin 7.2 on 2/8 13. Thrombocytopenia: Monitor for signs of bleeding.              Labs with HD  Resolved 14. Acute respiratory failure: Has resolved.   See #16 15.  Leukocytosis-some steroid effect  WBCs 18.5 on 2/8,  continue to monitor  Afebrile 16.  Steroid-induced hyperglycemia  SSI  Prednisone decreased to 20 on 2/4, decreased to 10 on 2/9, continue to wean  Controlled on 2/9  Continue to monitor  LOS: 12 days A FACE TO FACE EVALUATION WAS PERFORMED  Lugene Hitt Lorie Phenix 11/22/2020, 10:31 AM

## 2020-11-22 NOTE — Progress Notes (Signed)
Physical Therapy Session Note  Patient Details  Name: Raymond Hurley. MRN: [id number] Date of Birth: 18-May-1931  Today's Date: 11/22/2020 PT Individual Time: 0917-1030 PT Individual Time Calculation (min): 73 min   Short Term Goals: Week 2:  PT Short Term Goal 1 (Week 2): Pt will perform stand<>pivot transfer with LRAD and CGA PT Short Term Goal 2 (Week 2): Pt will ambulate 23ft with LRAD and min A PT Short Term Goal 3 (Week 2): Pt will perform bed mobility with CGA consistantly  Skilled Therapeutic Interventions/Progress Updates:    Patient in w/c and reports sessions from yesterday and this morning.  Denies pain and reports plans to achieve at least S level as wife cannot assist.  Patient propelled w/c x 140' with S to dayroom.  Performed sit to stand initially mod A to RW and ambulated to mat x 13' with CGA.  Patient sit to stand x 4 with mat at lowest height min A overall.  Ambulated x 13' to chair with CGA.  Seated in w/c for Kinetron at 35 cm/sec 2 x 2 min bouts.  Seated stretch to hamstrings bilateral x 30 sec.  Patient requesting to toilet.  Assisted in w/c to room.  Stand step to toilet in bathroom with RW and min A.  Total A to doff/don pants and brief and for hygiene.  Patient needed one seated rest during hygiene task.  Stand step to w/c with min A.  Seated in w/c for therex including bicep curls, shoulder press (both holding small soda cans for resistance), rows with scapular squeeze and trunk flex/ext with arms crossed over chest all x 10-15 reps seated in w/c.  Left up in w/c with alarm belt active and needs/call bell in reach.  Therapy Documentation Precautions:  Precautions Precautions: Fall,Other (comment) Precaution Comments: Bowel incontinence (apparently had flexiseal removed yesterday) Restrictions Weight Bearing Restrictions: No Pain: Pain Assessment Pain Scale: 0-10 Pain Score: 0-No pain   Therapy/Group: Individual Therapy  Reginia Naas  Magda Kiel,  PT 11/22/2020, 9:49 AM

## 2020-11-22 NOTE — Progress Notes (Signed)
Recreational Therapy Session Note  Patient Details  Name: Kyrollos Cordell. MRN: [id number] Date of Birth: Jun 13, 1931 Today's Date: 11/22/2020  Pain: no c/o Skilled Therapeutic Interventions/Progress Updates: Session focued on activity tolerance and dynamic standing balance during co-treat with PT.  Pt ambulated with RW with Min assist, +2 for w/c follow due to fatigue.  Pt with c/o dizziness requesting to sit, needing long rest break before completing the next tasks.  Pt states sit-stands are challenging for him so the remaining time spent performing sit-stands with RW reaching for playing cards at various heights/planes with 1 UE support with contact guard assist for ~20 seconds.    Therapy/Group: Co-Treatment   Treesa Mccully 11/22/2020, 4:11 PM

## 2020-11-22 NOTE — Progress Notes (Signed)
Patient ID: Raymond Hurley., male   DOB: October 13, 1931, 87 y.o.   MRN: [id number] [place] with wife via telephone to give team conference update regarding goal and target discharge date. Discussed he will need assist-CGA-min she wants him walking with a walker when he leaves rehab. She wants him to stay as long as possible. Discussed original date 2/24 but if needs much assist and will need longer to achieve this level have moved discharge to 2/18 and will need to pursue alternative plans. Wife wants to talk with MD regarding this and since he is the final say regarding discharge date.She does understand we are not a skilled nursing facility and billed as such. She felt he had 100 days of coverage. She is aware now this is not the case. Follow up with her after taking with MD. Have text MD wife's cell number.

## 2020-11-22 NOTE — Patient Care Conference (Signed)
Inpatient RehabilitationTeam Conference and Plan of Care Update Date: 11/22/2020   Time: 11:46 AM    Patient Name: Raymond Hurley.      Medical Record Number: [id number]  Date of Birth: 29-Jun-1931 Sex: Male         Room/Bed: 4W25C/4W25C-01 Payor Info: Payor: MEDICARE / Plan: MEDICARE PART A AND B / Product Type: *No Product type* /    Admit Date/Time:  11/10/2020  3:28 PM  Primary Diagnosis:  [facility] Problems: Principal Problem:   Debility Active Problems:   Steroid-induced hyperglycemia   Leucocytosis   Acute on chronic anemia   ESRD on dialysis [facility])   Palliative care by specialist   Acute pancreatitis    Expected Discharge Date: Expected Discharge Date: 12/01/20 (SW to confirm possible SNF)  Team Members Present: Physician leading conference: Dr. Delice Lesch Care Coodinator Present: Dorien Chihuahua, RN, BSN, CRRN;Becky Dupree, LCSW Nurse Present: Pamella Pert) [place], LPN PT Present: Becky Sax, PT OT Present: [name], OT PPS Coordinator present : Gunnar Fusi, SLP     Current Status/Progress Goal Weekly Team Focus  Bowel/Bladder   Continent, oliuric, LBM 11/21/2020  Remain continent  Assess Q shift and prn   Swallow/Nutrition/ Hydration             ADL's   min-mod sit > stand with RW or grab bars, Min assist stand pivot transfers with RW, max assist toileting, min assist LB dressing with AE, Supervision/setup UB bathing, dressing, and grooming tasks  Supervision overall  ADL retraining, sit > stand, stand pivot transfers, dynamic standing balance, AE education, activity tolerance/endurance   Mobility   bed mobility min A, transfers with RW min A, gait 50ft with RW min A +2 for WC follow, WC mobility 141ft supervision.  supervision assist transfers and bed mobility, CGA gait, min A steps  functional mobility/transfers, generalized strengthening, dynamic standing balance/coordination, ambulation, and endurance   Communication              Safety/Cognition/ Behavioral Observations            Pain   no c/o pain  Pain <3  Assess Qshift and PRN   Skin   Unstageable pressure injury on right mid buttocks  Prevent further breakdown and infection  Assess Qshift and PRN     Discharge Planning:  Home with wife who very much wants him to stay as long as possible, aware team/MD decision on DC date   Team Discussion: Patient on steroids PTA. Anemia is stable. WBC level improved. Healing unstageable to buttocks. Patient on target to meet rehab goals: no, progress limited by poor endurance; requires extensive rest breaks between activities. Dizziness although no orthostasis noted and will respond abruptly with little warning to helper. Currently min - mod  Assist for toileting, min assist for lower body care and transfers with supervision for wheelchair mobility. Supervision - CGA goals set for discharge.  *See Care Plan and progress notes for long and short-term goals.   Revisions to Treatment Plan:   Teaching Needs: Transfers, toileting, medications, etc.   Current Barriers to Discharge: Decreased caregiver support, Home enviroment access/layout and Hemodialysis  Possible Resolutions to Barriers: SW to meet with patient and wife regarding discharge plan and assist available Family education Ramp recommended for discharge  DME recommendations for w/c , walker, 3N1 and tub bench     Medical Summary Current Status: Generalized weakness affecting ability to stand or complete ADL tasks secondary to debility.  Barriers to  Discharge: Medical stability;Hemodialysis;Decreased family/caregiver support;Weight;Wound care   Possible Resolutions to Barriers/Weekly Focus: Therapies, wean steroids, follow labs - Hb, Plts, WBCs, follow weights   Continued Need for Acute Rehabilitation Level of Care: The patient requires daily medical management by a physician with specialized training in physical medicine and rehabilitation for the  following reasons: Direction of a multidisciplinary physical rehabilitation program to maximize functional independence : Yes Medical management of patient stability for increased activity during participation in an intensive rehabilitation regime.: Yes Analysis of laboratory values and/or radiology reports with any subsequent need for medication adjustment and/or medical intervention. : Yes   I attest that I was present, lead the team conference, and concur with the assessment and plan of the team.   Dorien Chihuahua B 11/22/2020, 2:15 PM

## 2020-11-22 NOTE — Progress Notes (Signed)
Patient ID: Raymond Hurley., male   DOB: January 14, 1931, 87 y.o.   MRN: [id number]  This NP reviewed medical records,  discussed case with team.     Patient is currently in CIR and working well with therapies. He is  HD dependent, frail and with multiple co-morbidites, high risk to decompensate 2/2 to multiple co-morbidites.  I met at patient's bedside, he is alert and oriented.  He is pleasant and friendly.  He is sitting OOB in chair.     Created space and opportunity for patient  to explore his thoughts and feeling regarding his current medical situation.   Patient is optimistic for continued improvement,  He is looking forward to several more weeks in rehab for continued improvement.  He does voice concern for his anticipatory care needs.  " I need to be able to do more for myself to be able to go home"  Patient called wife and we three spoke by speaker on phone.   Wife is very clear that she understands the situation, understands options and plans to continue conversation with rehab treatment team as patient moves forward into next steps of transitions of care.  Education offered on the importance of conversation and documentation of advanced care planning.  No ACP documents at this time.  Emotional support offered.     Questions and concerns addressed   I will continue to follow up with Mr Septer intermittently during his rehab stay. I encouraged him to call will questions or concerns.   Total time spent on the unit was 35 minutes  Greater than 50% of the time was spent in counseling and coordination of care  Wadie Lessen NP  Palliative Medicine Team Team Phone # [phone] Pager [phone]

## 2020-11-22 NOTE — Progress Notes (Signed)
Occupational Therapy Session Note  Patient Details  Name: Raymond Hurley. MRN: [id number] Date of Birth: 22-Aug-1931  Today's Date: 11/22/2020 OT Individual Time: 9485-4627 OT Individual Time Calculation (min): 60 min    Short Term Goals: Week 2:  OT Short Term Goal 1 (Week 2): Pt will transfer to toilet with MIN A consistently OT Short Term Goal 2 (Week 2): Pt will stand to complete 1 grooming task to demo improved activity tolerance OT Short Term Goal 3 (Week 2): Pt will complete 2/3 toileting steps consistently OT Short Term Goal 4 (Week 2): Pt will don socks/shoes with min assist with AE PRN  Skilled Therapeutic Interventions/Progress Updates:    Treatment session with focus on self-care retraining, functional tranfers, sit > stand, and dynamic standing balance during self-care tasks.  Pt received semi-reclined in bed having just finished breakfast. Pt reports need to toilet.  Completed bed mobility with increased time, CGA for sidelying to sitting with HOB elevated.  Pt completed sit > stand from EOB with mod assist, min assist for remaining sit > stand from w/c during self-care tasks.  Pt transferred w/c > BSC over toilet with RW with min assist sit > stand and min-CGA for stand pivot transfer.  Pt reports increased dizziness, therefore therapist doffed pants prior to having pt sit on toilet.  Pt required increased time for BM. Completed sit > stand from toilet with mod assist requiring increased facilitation for anterior weight shift after prolonged sitting on BSC.  Pt maintained standing with CGA while therapist pulled pants over hips.  Pt returned to w/c stand pivot with RW with CGA.  Pt positioned at sink to complete grooming tasks.  Pt remained seated at sink with seat belt alarm on and all needs in reach.  RN aware of pt positioning.  Pt with increased reports of dizziness with all standing and transfers this session.  Still passes in ~2 mins, however greatly impacting standing  tolerance this session.  Therapy Documentation Precautions:  Precautions Precautions: Fall,Other (comment) Precaution Comments: Bowel incontinence (apparently had flexiseal removed yesterday) Restrictions Weight Bearing Restrictions: No General:   Vital Signs: Therapy Vitals Temp: (!) 97.5 F (36.4 C) Pulse Rate: 75 Resp: 16 BP: 112/60 Patient Position (if appropriate): Lying Oxygen Therapy SpO2: 97 % O2 Device: Room Air Pain:   ADL: ADL Eating: Set up Where Assessed-Eating: Bed level Grooming: Supervision/safety Where Assessed-Grooming: Edge of bed Lower Body Bathing: Moderate assistance Where Assessed-Lower Body Bathing: Edge of bed Upper Body Dressing: Minimal assistance Where Assessed-Upper Body Dressing: Edge of bed Lower Body Dressing: Maximal assistance Where Assessed-Lower Body Dressing: Edge of bed Vision   Perception    Praxis   Exercises:   Other Treatments:     Therapy/Group: Individual Therapy  Vincy Feliz, [place] 11/22/2020, 8:00 AM

## 2020-11-22 NOTE — Progress Notes (Signed)
Physical Therapy Session Note  Patient Details  Name: Raymond Hurley. MRN: [id number] Date of Birth: Feb 08, 1931  Today's Date: 11/22/2020 PT Individual Time: 4859-2763 PT Individual Time Calculation (min): 41 min   Short Term Goals: Week 1:  PT Short Term Goal 1 (Week 1): Pt will trasnfer to WC with CGA and LRAD PT Short Term Goal 1 - Progress (Week 1): Progressing toward goal PT Short Term Goal 2 (Week 1): Pt will performed bed mobility with CGA PT Short Term Goal 2 - Progress (Week 1): Progressing toward goal PT Short Term Goal 3 (Week 1): Pt will ambulate 75f with min assist and LRAD PT Short Term Goal 3 - Progress (Week 1): Progressing toward goal PT Short Term Goal 4 (Week 1): Pt will propell WC 152fwith supervision assist PT Short Term Goal 4 - Progress (Week 1): Met PT Short Term Goal 5 (Week 1): pt will tolerate sitting up in WC >2 hours between therapies PT Short Term Goal 5 - Progress (Week 1): Met Week 2:  PT Short Term Goal 1 (Week 2): Pt will perform stand<>pivot transfer with LRAD and CGA PT Short Term Goal 2 (Week 2): Pt will ambulate 3069fith LRAD and min A PT Short Term Goal 3 (Week 2): Pt will perform bed mobility with CGA consistantly  Skilled Therapeutic Interventions/Progress Updates:   Received pt sitting in WC, pt agreeable to therapy, and denied any pain during session but reported feeling weak from previous therapies. Session with emphasis on functional mobility/transfers, generalized strengthening, dynamic standing balance/coordination, ambulation, and improved activity tolerance. [facility] purposes and transferred sit<>stand with RW and min A. Pt ambulated [id number] RW and CGA with +2 for WC follow. Pt reported weakness and dizziness and returned to sitting for extensive rest break. Pt transferred sit<>stand with RW and min A x 2 trials and worked on dynamic standing balance reaching for cards from  recreational therapist from various heights/angles with 1 UE support and CGA for balance. Pt able to remain standing 20-30 seconds at a time prior to requesting to sit. Pt [facility]. Concluded session with pt sitting in WC, [facility] reach, and seatbelt alarm on. Pt's wife, AnneWebb Silversmithesent at bedside. Updated AnneWebb Silversmithprogress pt has made in therapy and what pt accomplished today. Assisted pt with changing jackets per Anne's request. Pt's wife insisting pt stay as long as possible stating "I want him to be able to walk out of here."   Therapy Documentation Precautions:  Precautions Precautions: Fall,Other (comment) Precaution Comments: Bowel incontinence (apparently had flexiseal removed yesterday) Restrictions Weight Bearing Restrictions: No  Therapy/Group: Individual Therapy AnnaAlfonse Alpers DPT   11/22/2020, 7:26 AM

## 2020-11-22 NOTE — Progress Notes (Signed)
[facility] NEPHROLOGY PROGRESS NOTE  Assessment/ Plan:  # Acute kidney Injury: With baseline creatinine of 1.2 and acute kidney injury likely from ATN associated with Covid infection/acute pancreatitis and shock.  Unfortunately, remains anuric/with minimal urine output recorded and to continue hemodialysis on a TTS schedule while here (last dialyzed 2/5).  He is suspected to have evolved into end-stage renal disease with dialysis dependent acute kidney injury for the past 6 weeks.  The plan is to pursue permanent access as an outpatient after he gets physically stronger. Status post HD on 2/8 with 1.8 L UF, tolerated well.  No sign of renal recovery so far.  Discussed with patient and his wife today.  Plan for next HD tomorrow.  # Acute pancreatitis: This was noted on CT scan earlier (1/3 and 1/7).  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  #  Acute hypoxic respiratory failure: Secondary to Covid pneumonia and possible aspiration pneumonitis.  Status post corticosteroids and supportive management-now on room air and without supplemental oxygen requirements.  #  Anemia: Secondary to acute/critical illness and GI losses.  Continue current dose of ESA.  IV iron prohibitive because of very high ferritin level.  Received PRBC.  #  Secondary hyperparathyroidism: Phosphorus level elevated, started sevelamer.  PTH level 217 at goal.  #Hypotension: On midodrine.  Blood pressure acceptable.  Subjective: Seen and examined.  No new event.  Denies nausea vomiting chest pain shortness of breath.  Discussed with the patient's wife over the phone.  Objective Vital signs in last 24 hours: Vitals:   11/21/20 1821 11/21/20 1930 11/22/20 0430 11/22/20 0500  BP: 123/67 106/69 112/60   Pulse:  75 75   Resp: 16 18 16    Temp: 97.7 F (36.5 C) 98.5 F (36.9 C) (!) 97.5 F (36.4 C)   TempSrc: Oral Oral    SpO2: 96% 100%  97%   Weight: 96.7 kg   79.5 kg  Height:       Weight change:   Intake/Output Summary (Last 24 hours) at 11/22/2020 1307 Last data filed at 11/22/2020 0947 Gross per 24 hour  Intake 120 ml  Output 1836 ml  Net -1716 ml       Labs: Basic Metabolic Panel: Recent Labs  Lab 11/16/20 1801 11/18/20 1403 11/21/20 0503  NA 138 136 136  K 3.7 3.8 4.3  CL 98 95* 96*  CO2 23 16* 17*  GLUCOSE 163* 154* 86  BUN 42* 82* 100*  CREATININE 4.52* 7.15* 7.86*  CALCIUM 9.0 9.2 9.5  PHOS 4.1 5.8* 6.0*   Liver Function Tests: Recent Labs  Lab 11/16/20 1801 11/18/20 1403 11/21/20 0503  ALBUMIN 3.3* 2.8* 2.7*   No results for input(s): LIPASE, AMYLASE in the last 168 hours. No results for input(s): AMMONIA in the last 168 hours. CBC: Recent Labs  Lab 11/16/20 1313 11/16/20 1801 11/18/20 1403 11/21/20 0503  WBC 26.1* 24.2* 20.5* 18.5*  HGB 7.4* 7.5* 6.9* 7.2*  HCT 24.0* 23.0* 22.7* 22.0*  MCV 96.4 92.4 97.0 93.2  PLT 202 145* 191 178   Cardiac Enzymes: No results for input(s): CKTOTAL, CKMB, CKMBINDEX, TROPONINI in the last 168 hours. CBG: Recent Labs  Lab 11/21/20 1207 11/21/20 1927 11/21/20 2121 11/22/20 0631 11/22/20 1125  GLUCAP 98 96 112* 79 123*    Iron Studies: No results for input(s): IRON, TIBC, TRANSFERRIN, FERRITIN in the last 72 hours. Studies/Results: No results found.  Medications: Infusions:  Scheduled Medications: . sodium chloride   Intravenous Once  . amiodarone  400 mg Oral BID  . B-complex with vitamin C  1 tablet Oral Daily  . Chlorhexidine Gluconate Cloth  6 each Topical Q0600  . Chlorhexidine Gluconate Cloth  6 each Topical Q0600  . darbepoetin (ARANESP) injection - DIALYSIS  200 mcg Intravenous Q Tue-HD  . feeding supplement (NEPRO CARB STEADY)  237 mL Oral TID WC  . Gerhardt's butt cream   Topical TID  . insulin aspart  3-9 Units Subcutaneous TID WC  . lactobacillus acidophilus  2 tablet Oral TID  . lidocaine  1 patch Transdermal  Q24H  . mouth rinse  15 mL Mouth Rinse BID  . midodrine  10 mg Oral TID WC  . predniSONE  10 mg Oral Q breakfast  . sevelamer carbonate  800 mg Oral TID WC  . sucralfate  1 g Oral TID WC & HS  . triamcinolone 0.1 % cream : eucerin   Topical BID    have reviewed scheduled and prn medications.  Physical Exam: General:NAD, comfortable Heart:RRR, s1s2 nl Lungs:clear b/l, no crackle Abdomen:soft, Non-tender, non-distended Extremities: Trace ankle edema, stable Dialysis Access: Right IJ TDC placed by IR on 1/24.  Site clean  [employer] 11/22/2020,1:07 PM  LOS: 12 days

## 2020-11-23 LAB — GLUCOSE, CAPILLARY
Glucose-Capillary: 80 mg/dL (ref 70–99)
Glucose-Capillary: 102 mg/dL — ABNORMAL HIGH (ref 70–99)
Glucose-Capillary: 83 mg/dL (ref 70–99)

## 2020-11-23 LAB — RENAL FUNCTION PANEL
Albumin: 2.7 g/dL — ABNORMAL LOW (ref 3.5–5.0)
Anion gap: 18 — ABNORMAL HIGH (ref 5–15)
BUN: 73 mg/dL — ABNORMAL HIGH (ref 8–23)
CO2: 21 mmol/L — ABNORMAL LOW (ref 22–32)
Calcium: 8.6 mg/dL — ABNORMAL LOW (ref 8.9–10.3)
Chloride: 93 mmol/L — ABNORMAL LOW (ref 98–111)
Creatinine, Ser: 7.19 mg/dL — ABNORMAL HIGH (ref 0.61–1.24)
GFR, Estimated: 7 mL/min — ABNORMAL LOW (ref >60)
Glucose, Bld: 119 mg/dL — ABNORMAL HIGH (ref 70–99)
Phosphorus: 4.9 mg/dL — ABNORMAL HIGH (ref 2.5–4.6)
Potassium: 3.7 mmol/L (ref 3.5–5.1)
Sodium: 132 mmol/L — ABNORMAL LOW (ref 135–145)

## 2020-11-23 LAB — CBC
HCT: 21.1 % — ABNORMAL LOW (ref 39.0–52.0)
Hemoglobin: 7 g/dL — ABNORMAL LOW (ref 13.0–17.0)
MCH: 31.4 pg (ref 26.0–34.0)
MCHC: 33.2 g/dL (ref 30.0–36.0)
MCV: 94.6 fL (ref 80.0–100.0)
Platelets: 196 10*3/uL (ref 150–400)
RBC: 2.23 MIL/uL — ABNORMAL LOW (ref 4.22–5.81)
RDW: 26.4 % — ABNORMAL HIGH (ref 11.5–15.5)
WBC: 12.1 10*3/uL — ABNORMAL HIGH (ref 4.0–10.5)
nRBC: 0 % (ref 0.0–0.2)

## 2020-11-23 MED ORDER — HEPARIN SODIUM (PORCINE) 1000 UNIT/ML DIALYSIS
1000.0000 [IU] | INTRAMUSCULAR | Status: DC | PRN
Start: 2020-11-23 — End: 2020-11-23
  Filled 2020-11-23: qty 1

## 2020-11-23 MED ORDER — SODIUM CHLORIDE 0.9 % IV SOLN: 100.0000 mL | INTRAVENOUS | Status: DC | PRN

## 2020-11-23 MED ORDER — MIDODRINE HCL 5 MG PO TABS
ORAL_TABLET | ORAL | Status: AC
  Filled 2020-11-23: qty 2

## 2020-11-23 MED ORDER — LIDOCAINE HCL (PF) 1 % IJ SOLN
5.0000 mL | INTRAMUSCULAR | Status: DC | PRN
  Filled 2020-11-23: qty 5

## 2020-11-23 MED ORDER — LIDOCAINE-PRILOCAINE 2.5-2.5 % EX CREA
1.0000 "application " | TOPICAL_CREAM | CUTANEOUS | Status: DC | PRN
  Filled 2020-11-23: qty 5

## 2020-11-23 MED ORDER — HEPARIN SODIUM (PORCINE) 1000 UNIT/ML DIALYSIS
1000.0000 [IU] | INTRAMUSCULAR | Status: DC | PRN
  Filled 2020-11-23: qty 1

## 2020-11-23 MED ORDER — ALTEPLASE 2 MG IJ SOLR: 2.0000 mg | Freq: Once | INTRAMUSCULAR | Status: DC | PRN

## 2020-11-23 MED ORDER — PENTAFLUOROPROP-TETRAFLUOROETH EX AERO: 1.0000 "application " | INHALATION_SPRAY | CUTANEOUS | Status: DC | PRN

## 2020-11-23 MED ORDER — HEPARIN SODIUM (PORCINE) 1000 UNIT/ML DIALYSIS
20.0000 [IU]/kg | INTRAMUSCULAR | Status: DC | PRN
  Filled 2020-11-23: qty 2

## 2020-11-23 MED ORDER — HEPARIN SODIUM (PORCINE) 1000 UNIT/ML IJ SOLN
INTRAMUSCULAR | Status: AC
  Filled 2020-11-23: qty 1

## 2020-11-23 NOTE — Progress Notes (Signed)
Physical Therapy Session Note  Patient Details  Name: Raymond Hurley. MRN: [id number] Date of Birth: [birth year]  Today's Date: 11/23/2020 PT Individual Time: 1013-1051 PT Individual Time Calculation (min): 38 min   Short Term Goals: Week 2:  PT Short Term Goal 1 (Week 2): Pt will perform stand<>pivot transfer with LRAD and CGA PT Short Term Goal 2 (Week 2): Pt will ambulate 58ft with LRAD and min A PT Short Term Goal 3 (Week 2): Pt will perform bed mobility with CGA consistantly  Skilled Therapeutic Interventions/Progress Updates:    Pt received sitting in w/c and agreeable to therapy session. Transported to/from gym in w/c for time management and energy conservation. Performed B LE strengthening in sitting via reciprocal movement patterns on Kinetron against 20cm/sec resistance 2x 101minutes. Pt discussed his plan for a ramped home entrance and placing a grip surface for increased safety on the ramp. Sit>stands w/c or elevated EOM>RW with min assist for lifting and balance. Gait training ~71ft 2x using RW with CGA/min assist for balance - demos slow gait speed with increased trunk flexion but improving B LE step lengths and foot clearances. After each walk pt reports feeling "lightheaded" and demos some SOB - instructed pt in deep breathing with symptoms dissipating in ~1-25minutes. Pt continues to require prolonged therapeutic rest breaks after each activity demonstrating impaired endurance. Pt left sitting in w/c as hand-off to primary PT.   Therapy Documentation Precautions:  Precautions Precautions: Fall,Other (comment) Precaution Comments: Bowel incontinence (apparently had flexiseal removed yesterday) Restrictions Weight Bearing Restrictions: No  Pain:   No reports of pain throughout session.  Therapy/Group: Individual Therapy  Tawana Scale , PT, DPT, CSRS  11/23/2020, 7:56 AM

## 2020-11-23 NOTE — Progress Notes (Signed)
[facility]YSICAL MEDICINE & REHABILITATION PROGRESS NOTE   Subjective/Complaints: Patient seen sitting up in his chair this morning working with therapies.  He states he slept well overnight.  Prolonged discussion with patient regarding discharge goals and level of assistance needed.  He can states that he would need to be independent with a rolling walker and even without assistive device for transfers from bed to chair to toilet due to limited space.  Patient states he will discuss goals and likelihood of discharge home with wife.  He was seen by palliative care yesterday, notes reviewed-working on education.  ROS: Denies CP, SOB, N/V/D  Objective:   No results found. Recent Labs    11/21/20 0503  WBC 18.5*  HGB 7.2*  HCT 22.0*  PLT 178   Recent Labs    11/21/20 0503  NA 136  K 4.3  CL 96*  CO2 17*  GLUCOSE 86  BUN 100*  CREATININE 7.86*  CALCIUM 9.5    Intake/Output Summary (Last 24 hours) at 11/23/2020 1449 Last data filed at 11/23/2020 1438 Gross per 24 hour  Intake 630 ml  Output --  Net 630 ml     Pressure Injury 11/10/20 Buttocks Right;Mid Unstageable - Full thickness tissue loss in which the base of the injury is covered by slough (yellow, tan, gray, green or brown) and/or eschar (tan, brown or black) in the wound bed. red open area of partial s (Active)  11/10/20 1721  Location: Buttocks  Location Orientation: Right;Mid  Staging: Unstageable - Full thickness tissue loss in which the base of the injury is covered by slough (yellow, tan, gray, green or brown) and/or eschar (tan, brown or black) in the wound bed.  Wound Description (Comments): red open area of partial skin loss with eschar covering wound bed  Present on Admission: Yes    Physical Exam: Vital Signs Blood pressure (!) 87/67, pulse 86, temperature 97.7 F (36.5 C), resp. rate 17, height 5\' 10"  (1.778 m), weight 79.5 kg, SpO2 100 %.  Constitutional: No distress . Vital signs  reviewed. HENT: Normocephalic.  Atraumatic. Eyes: EOMI. No discharge. Cardiovascular: No JVD.  RRR. Respiratory: Normal effort.  No stridor.  Bilateral clear to auscultation. GI: Non-distended.  BS +. Skin: Warm and dry.  Intact. Psych: Normal mood.  Normal behavior. Musc: Edema in bilateral feet, unchanged No tenderness in extremities. Neuro: Alert Motor: Bilateral upper extremities: 4+/5 throughout, unchanged Left lower extremity: Hip flexion, knee extension 4-/5, ankle dorsiflexion 4+/5, stable Right lower extremity: Hip flexion, knee extension 3 +/5, ankle dorsiflexion 4+/5, stable  Assessment/Plan: 1. Functional deficits which require 3+ hours per day of interdisciplinary therapy in a comprehensive inpatient rehab setting.  Physiatrist is providing close team supervision and 24 hour management of active medical problems listed below.  Physiatrist and rehab team continue to assess barriers to discharge/monitor patient progress toward functional and medical goals  Care Tool:  Bathing    Body parts bathed by patient: Right arm,Left arm,Chest,Abdomen,Face   Body parts bathed by helper: Buttocks,Right lower leg,Left lower leg     Bathing assist Assist Level: Supervision/Verbal cueing     Upper Body Dressing/Undressing Upper body dressing   What is the patient wearing?: Pull over shirt,Button up shirt    Upper body assist Assist Level: Supervision/Verbal cueing    Lower Body Dressing/Undressing Lower body dressing      What is the patient wearing?: Pants     Lower body assist Assist for lower body dressing: Minimal Assistance - Patient >  75%     Toileting Toileting    Toileting assist Assist for toileting: Maximal Assistance - Patient 25 - 49%     Transfers Chair/bed transfer  Transfers assist     Chair/bed transfer assist level: Minimal Assistance - Patient > 75%     Locomotion Ambulation   Ambulation assist   Ambulation activity did not occur:  Safety/medical concerns  Assist level: Minimal Assistance - Patient > 75% Assistive device: Walker-rolling Max distance: 79ft   Walk 10 feet activity   Assist  Walk 10 feet activity did not occur: Safety/medical concerns  Assist level: Minimal Assistance - Patient > 75% Assistive device: Walker-rolling   Walk 50 feet activity   Assist Walk 50 feet with 2 turns activity did not occur: Safety/medical concerns         Walk 150 feet activity   Assist Walk 150 feet activity did not occur: Safety/medical concerns         Walk 10 feet on uneven surface  activity   Assist Walk 10 feet on uneven surfaces activity did not occur: Safety/medical concerns         Wheelchair     Assist Will patient use wheelchair at discharge?: Yes Type of Wheelchair: Manual    Wheelchair assist level: Supervision/Verbal cueing Max wheelchair distance: 130'    Wheelchair 50 feet with 2 turns activity    Assist        Assist Level: Supervision/Verbal cueing   Wheelchair 150 feet activity     Assist      Assist Level: Supervision/Verbal cueing     Medical Problem List and Plan: 1.  Generalized weakness affecting ability to stand or complete ADL tasks secondary to debility.  Continue CIR   Discussed with wife plans for discharge and goals of care-wife insistent she would like patient at modified independent level with rolling walker prior to discharge and that he should stay in inpatient rehab for as long as possible.  Attempted to educate wife.  Wife repeats same goals.  Assured wife are trying to make him as independent as possible but a backup plan should be in place.  Discussed with patient as well, who states he will discuss with his wife 2.  Antithrombotics: -DVT/anticoagulation:  Mechanical: Sequential compression devices, below knee Bilateral lower extremities due to GI bleed.             -antiplatelet therapy: N/A 3. Pain Management: Tylenol prn.    Controlled with meds on 2/10 4. Mood: LCSW to follow for evaluation and support.              -antipsychotic agents: N/a 5. Neuropsych: This patient is capable of making decisions on his own behalf. 6. Skin/Wound Care: Routine pressure relief measures.   Discussed bedding and pressure relief for healing of sacral ulcer, discussed again, improving 7. Fluids/Electrolytes/Nutrition: Strict I/Os.  Labs with HD.discussed 129ml fluid restriction  8. ESRD: On midodrine prior to HD to help support BP.   Permanent access as outpatient 9. Chronic systolic CHF/CAF: Strict I/Os. Fluid status managed with HD. On Amiodarone 400 mg bid for rate control.              Daily weights  Lasix per Nephro, appreciate Recs Filed Weights   11/21/20 1821 11/22/20 0500 11/23/20 0428  Weight: 96.7 kg 79.5 kg 79.5 kg   Stable on 2/10  Monitor heart rate with increased activity 10.  Pancreatitis: Has completed antibiotic course. Tolerating diet without nausea, diarrhea or abdominal  pain.              Monitor closely.  Tolerating diet at present 11. LGIB: On Carafate ac/hs.  12. Acute on chronic anemia: On aranresp weekly.              Labs with HD, transfused on 2/5  Hemoglobin 7.2 on 2/8 13. Thrombocytopenia: Monitor for signs of bleeding.              Labs with HD  Resolved 14. Acute respiratory failure: Has resolved.   See #16 15.  Leukocytosis-some steroid effect  WBCs 18.5 on 2/8, continue to monitor  Afebrile 16.  Steroid-induced hyperglycemia  SSI  Prednisone decreased to 20 on 2/4, decreased to 10 on 2/9, continue to wean  Controlled on 2/10  Continue to monitor  LOS: 13 days A FACE TO FACE EVALUATION WAS PERFORMED  Jillianna Stanek Lorie Phenix 11/23/2020, 2:49 PM

## 2020-11-23 NOTE — Progress Notes (Signed)
[facility] NEPHROLOG[facility] PROGRESS NOTE  Assessment/ Plan:  # Acute kidney Injury: With baseline creatinine of 1.2 and acute kidney injury likely from ATN associated with Covid infection/acute pancreatitis and shock.  Unfortunately, remains anuric/with minimal urine output recorded and to continue hemodialysis on a TTS schedule while here (last dialyzed 2/5).  He is suspected to have evolved into end-stage renal disease with dialysis dependent acute kidney injury for the past 6 weeks.  The plan is to pursue permanent access as an outpatient after he gets physically stronger. Status post HD on 2/10 with 1.5 L UF, tolerated well.  No sign of renal recovery so far.   Plan for next HD Sat.  # Acute pancreatitis: This was noted on CT scan earlier (1/3 and 1/7).  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  #  Acute hypoxic respiratory failure: Secondary to Covid pneumonia and possible aspiration pneumonitis.  Status post corticosteroids and supportive management-now on room air and without supplemental oxygen requirements.  #  Anemia: Secondary to acute/critical illness and GI losses.  Continue current dose of ESA.  IV iron prohibitive because of very high ferritin level.  Received PRBC.  #  Secondary hyperparathyroidism: Phosphorus level elevated, started sevelamer.  PTH level 217 at goal.  #Hypotension: On midodrine.  Blood pressure acceptable.  Subjective: Seen and examined.  No new event.  Denies nausea vomiting chest pain shortness of breath. Tolerated HD with 1.5L UF today.  Objective Vital signs in last 24 hours: Vitals:   11/23/20 1730 11/23/20 1800 11/23/20 1828 11/23/20 1832  BP: (!) 97/58 (!) 91/53  (!) 106/54  Pulse: 80 85  81  Resp:   17   Temp:   97.9 F (36.6 C)   TempSrc:   Oral   SpO2:   99%   Weight:   80 kg   Height:       Weight change: -17.2 kg  Intake/Output Summary (Last 24 hours)  at 11/23/2020 1940 Last data filed at 11/23/2020 1821 Gross per 24 hour  Intake 540 ml  Output 1500 ml  Net -960 ml       Labs: Basic Metabolic Panel: Recent Labs  Lab 11/18/20 1403 11/21/20 0503 11/23/20 1606  NA 136 136 132*  K 3.8 4.3 3.7  CL 95* 96* 93*  CO2 16* 17* 21*  GLUCOSE 154* 86 119*  BUN 82* 100* 73*  CREATININE 7.15* 7.86* 7.19*  CALCIUM 9.2 9.5 8.6*  PHOS 5.8* 6.0* 4.9*   Liver Function Tests: Recent Labs  Lab 11/18/20 1403 11/21/20 0503 11/23/20 1606  ALBUMIN 2.8* 2.7* 2.7*   No results for input(s): LIPASE, AMYLASE in the last 168 hours. No results for input(s): AMMONIA in the last 168 hours. CBC: Recent Labs  Lab 11/18/20 1403 11/21/20 0503 11/23/20 1606  WBC 20.5* 18.5* 12.1*  HGB 6.9* 7.2* 7.0*  HCT 22.7* 22.0* 21.1*  MCV 97.0 93.2 94.6  PLT 191 178 196   Cardiac Enzymes: No results for input(s): CKTOTAL, CKMB, CKMBINDEX, TROPONINI in the last 168 hours. CBG: Recent Labs  Lab 11/22/20 1125 11/22/20 1648 11/22/20 2053 11/23/20 0601 11/23/20 1157  GLUCAP 123* 113* 107* 80 102*    Iron Studies: No results for input(s): IRON, TIBC, TRANSFERRIN, FERRITIN in the last 72 hours. Studies/Results: No results found.  Medications: Infusions:   Scheduled Medications: . sodium chloride   Intravenous Once  . amiodarone  400 mg Oral BID  . B-complex  with vitamin C  1 tablet Oral Daily  . Chlorhexidine Gluconate Cloth  6 each Topical Q0600  . Chlorhexidine Gluconate Cloth  6 each Topical Q0600  . Chlorhexidine Gluconate Cloth  6 each Topical Q0600  . darbepoetin (ARANESP) injection - DIALYSIS  200 mcg Intravenous Q Tue-HD  . feeding supplement (NEPRO CARB STEADY)  237 mL Oral TID WC  . Gerhardt's butt cream   Topical TID  . heparin sodium (porcine)      . insulin aspart  3-9 Units Subcutaneous TID WC  . lactobacillus acidophilus  2 tablet Oral TID  . lidocaine  1 patch Transdermal Q24H  . mouth rinse  15 mL Mouth Rinse BID  .  midodrine      . midodrine  10 mg Oral TID WC  . predniSONE  10 mg Oral Q breakfast  . sevelamer carbonate  800 mg Oral TID WC  . sucralfate  1 g Oral TID WC & HS  . triamcinolone 0.1 % cream : eucerin   Topical BID    have reviewed scheduled and prn medications.  Physical Exam: General:NAD, comfortable Heart:RRR, s1s2 nl Lungs:clear b/l, no crackle Abdomen:soft, Non-tender, non-distended Extremities: Trace ankle edema, stable Dialysis Access: Right IJ TDC placed by IR on 1/24.  Site clean  [employer] 11/23/2020,7:40 PM  LOS: 13 days

## 2020-11-23 NOTE — Progress Notes (Signed)
Physical Therapy Session Note  Patient Details  Name:  Raymond Hurley. MRN: [id number] Date of Birth: 02/20/1931  Today's Date: 11/23/2020 PT Individual Time: 1051-1200 PT Individual Time Calculation (min): 69 min   Short Term Goals: Week 1:  PT Short Term Goal 1 (Week 1): Pt will trasnfer to WC with CGA and LRAD PT Short Term Goal 1 - Progress (Week 1): Progressing toward goal PT Short Term Goal 2 (Week 1): Pt will performed bed mobility with CGA PT Short Term Goal 2 - Progress (Week 1): Progressing toward goal PT Short Term Goal 3 (Week 1): Pt will ambulate 30ft with min assist and LRAD PT Short Term Goal 3 - Progress (Week 1): Progressing toward goal PT Short Term Goal 4 (Week 1): Pt will propell WC 150ft with supervision assist PT Short Term Goal 4 - Progress (Week 1): Met PT Short Term Goal 5 (Week 1): pt will tolerate sitting up in WC >2 hours between therapies PT Short Term Goal 5 - Progress (Week 1): Met Week 2:  PT Short Term Goal 1 (Week 2): Pt will perform stand<>pivot transfer with LRAD and CGA PT Short Term Goal 2 (Week 2): Pt will ambulate 30ft with LRAD and min A PT Short Term Goal 3 (Week 2): Pt will perform bed mobility with CGA consistantly  Skilled Therapeutic Interventions/Progress Updates:   Received pt sitting in WC in dayroom, handoff from previous PT, pt agreeable to therapy, and denied any pain during session. Session with emphasis on functional mobility/transfers, generalized strengthening, dynamic sitting balance/coordination, and improved activity tolerance. Pt reported 5/10 fatigue from previous therapy and transported to ortho gym in WC total A for energy conservation purposes. Pt performed BUE strengthening on UBE for 2 minutes forward and 2 minutes backwards on level 1 with 1 rest break halfway through. Pt reported feeling too fatigued to practice car transfers this morning. Pt performed the following seated activities on BITS with emphasis on visual  scanning, shoulder ROM, core strength, and memory: -cognitive memory with words. Pt with 100% accuracy with sequence of 5 and 7 words but with increased difficulty reaching outside BOS for words higher on screen Pt transported to dayroom in WC total A and performed x10 LAQ bilaterally and then repored urge to use restroom. Pt transported back to room in WC total A and set up stand<>pivot transfer to toilet. Pt then reported urge to have BM disappeared and requested to perform exercises in bathroom until urge returned. Pt performed the following exercises sitting in WC with supervision and verbal cues for technique: -shoulder rolls x15 forward and x10 backwards -hip flexion x8 bilaterally (decreased -rows with light orange TB 2x25 -bicep curls with light orange TB 2x10 bilaterally  -hamstring curls with light orange TB 2x15 bilaterally  -ER with elbows bent with light orange TB 2x12 Concluded session with pt sitting in WC, needs within reach, and seatbelt alarm on. NT present checking blood glucose.   Therapy Documentation Precautions:  Precautions Precautions: Fall,Other (comment) Precaution Comments: Bowel incontinence (apparently had flexiseal removed yesterday) Restrictions Weight Bearing Restrictions: No  Therapy/Group: Individual Therapy  M     PT, DPT   11/23/2020, 7:25 AM  

## 2020-11-23 NOTE — Progress Notes (Signed)
Occupational Therapy Session Note  Patient Details  Name: Raymond Hurley. MRN: [id number] Date of Birth: 11-18-1930  Today's Date: 11/23/2020 OT Individual Time: 2761-4709 OT Individual Time Calculation (min): 62 min    Short Term Goals: Week 2:  OT Short Term Goal 1 (Week 2): Pt will transfer to toilet with MIN A consistently OT Short Term Goal 2 (Week 2): Pt will stand to complete 1 grooming task to demo improved activity tolerance OT Short Term Goal 3 (Week 2): Pt will complete 2/3 toileting steps consistently OT Short Term Goal 4 (Week 2): Pt will don socks/shoes with min assist with AE PRN  Skilled Therapeutic Interventions/Progress Updates:    Patient in bed, alert and ready for therapy session.   Supine to sitting edge of bed with min A.  Sit to stand from elevated bed surface min A.   SPT with RW to w/c CGA.  He notes on going pain from sacral wound - reviewed and practiced weight shift and repositioning options at w/c level. Completed oral care and grooming tasks seated in w/c at sink with set up and increased time.  Dependent to donn teds, min A for donning slipper socks with sock aide - good recall of technique.  He remained seated in w/c at close of session, seat belt alarm set and call bell in hand.    Therapy Documentation Precautions:  Precautions Precautions: Fall,Other (comment) Precaution Comments: Bowel incontinence (apparently had flexiseal removed yesterday) Restrictions Weight Bearing Restrictions: No  Therapy/Group: Individual Therapy  Carlos Levering 11/23/2020, 7:29 AM

## 2020-11-23 NOTE — Progress Notes (Signed)
Physical Therapy Session Note  Patient Details  Name: Raymond Hurley. MRN: [id number] Date of Birth: 1930-12-10  Today's Date: 11/22/2020   Short Term Goals: Week 2:  PT Short Term Goal 1 (Week 2): Pt will perform stand<>pivot transfer with LRAD and CGA PT Short Term Goal 2 (Week 2): Pt will ambulate 3f with LRAD and min A PT Short Term Goal 3 (Week 2): Pt will perform bed mobility with CGA consistantly  Skilled Therapeutic Interventions/Progress Updates:   Pt received sitting in WC. Pt declined PT treatment, reporting that he "had enough therapy for the day." PT educated on benefits of continued physical activity, but continued to decline. Left sitting in WC with all needs met.      Therapy Documentation Precautions:  Precautions Precautions: Fall,Other (comment) Precaution Comments: Bowel incontinence (apparently had flexiseal removed yesterday) Restrictions Weight Bearing Restrictions: No General:  missed time: 30 min: refused/fatigue   Therapy/Group: Individual Therapy  AZAIVION KUNDRAT2/07/2021, 7:42 AM

## 2020-11-24 DIAGNOSIS — R42 Dizziness and giddiness: Secondary | ICD-10-CM

## 2020-11-24 LAB — GLUCOSE, CAPILLARY
Glucose-Capillary: 112 mg/dL — ABNORMAL HIGH (ref 70–99)
Glucose-Capillary: 128 mg/dL — ABNORMAL HIGH (ref 70–99)
Glucose-Capillary: 103 mg/dL — ABNORMAL HIGH (ref 70–99)
Glucose-Capillary: 112 mg/dL — ABNORMAL HIGH (ref 70–99)

## 2020-11-24 MED ORDER — CHLORHEXIDINE GLUCONATE CLOTH 2 % EX PADS
6.0000 | MEDICATED_PAD | Freq: Every day | CUTANEOUS | Status: DC
  Administered 2020-11-24 – 2020-11-27 (×2): 6 via TOPICAL

## 2020-11-24 NOTE — Progress Notes (Signed)
Pt resting in bed, still feeling very "exhausted", and states "i'm coming down from the dizziness when Im laying down, but once I'm up it starts back". Continuing to encourage fluids, pt has been drinking water, and currently gingerale with his lunch. Pt has been struggling to get his medication down today, had an episode where he medication got "stuck", and feeling like the medication won't go down. States "that has never happened before"  Pt took medication whole w/ applesauce to see if that would ease the medication down meds went down but still feel struggled w/ medication feeling stuck. Will continue to monitor.   Dayna Ramus

## 2020-11-24 NOTE — Progress Notes (Signed)
[facility] PHYSICAL MEDICINE & REHABILITATION PROGRESS NOTE   Subjective/Complaints: Patient seen sitting up in bed this morning.  He states he slept well overnight.  He is questions regarding dialysis scheduling, encourage discussion with nephrologist.  Later notified by nurse regarding dizziness.  Patient with very limited p.o. intake, encourage increase in fluids.  He was seen by nephrology yesterday, notes reviewed-no changes.  ROS: Denies CP, SOB, N/V/D  Objective:   No results found. Recent Labs    11/23/20 1606  WBC 12.1*  HGB 7.0*  HCT 21.1*  PLT 196   Recent Labs    11/23/20 1606  NA 132*  K 3.7  CL 93*  CO2 21*  GLUCOSE 119*  BUN 73*  CREATININE 7.19*  CALCIUM 8.6*    Intake/Output Summary (Last 24 hours) at 11/24/2020 1018 Last data filed at 11/23/2020 1821 Gross per 24 hour  Intake 300 ml  Output 1500 ml  Net -1200 ml     Pressure Injury 11/10/20 Buttocks Right;Mid Unstageable - Full thickness tissue loss in which the base of the injury is covered by slough (yellow, tan, gray, green or brown) and/or eschar (tan, brown or black) in the wound bed. red open area of partial s (Active)  11/10/20 1721  Location: Buttocks  Location Orientation: Right;Mid  Staging: Unstageable - Full thickness tissue loss in which the base of the injury is covered by slough (yellow, tan, gray, green or brown) and/or eschar (tan, brown or black) in the wound bed.  Wound Description (Comments): red open area of partial skin loss with eschar covering wound bed  Present on Admission: Yes    Physical Exam: Vital Signs Blood pressure (!) 102/54, pulse 85, temperature (!) 97.5 F (36.4 C), resp. rate 17, height 5\' 10"  (1.778 m), weight 79.1 kg, SpO2 98 %.  Constitutional: No distress . Vital signs reviewed. HENT: Normocephalic.  Atraumatic. Eyes: EOMI. No discharge. Cardiovascular: No JVD.  RRR. Respiratory: Normal effort.  No stridor.  Bilateral clear to auscultation. GI:  Non-distended.  BS +. Skin: Warm and dry.  Intact. Psych: Normal mood.  Normal behavior. Musc: Bilateral pedal edema No tenderness in extremities. Neuro: Alert Motor: Bilateral upper extremities: 4+/5 throughout, unchanged Left lower extremity: Hip flexion, knee extension 4-/5, ankle dorsiflexion 4+/5, unchanged Right lower extremity: Hip flexion, knee extension 3 +/5, ankle dorsiflexion 4+/5, unchanged  Assessment/Plan: 1. Functional deficits which require 3+ hours per day of interdisciplinary therapy in a comprehensive inpatient rehab setting.  Physiatrist is providing close team supervision and 24 hour management of active medical problems listed below.  Physiatrist and rehab team continue to assess barriers to discharge/monitor patient progress toward functional and medical goals  Care Tool:  Bathing    Body parts bathed by patient: Right arm,Left arm,Chest,Abdomen,Face   Body parts bathed by helper: Buttocks,Right lower leg,Left lower leg     Bathing assist Assist Level: Supervision/Verbal cueing     Upper Body Dressing/Undressing Upper body dressing   What is the patient wearing?: Pull over shirt,Button up shirt    Upper body assist Assist Level: Supervision/Verbal cueing    Lower Body Dressing/Undressing Lower body dressing      What is the patient wearing?: Pants     Lower body assist Assist for lower body dressing: Minimal Assistance - Patient > 75%     Toileting Toileting    Toileting assist Assist for toileting: Maximal Assistance - Patient 25 - 49%     Transfers Chair/bed transfer  Transfers assist  Chair/bed transfer assist level: Minimal Assistance - Patient > 75%     Locomotion Ambulation   Ambulation assist   Ambulation activity did not occur: Safety/medical concerns  Assist level: Minimal Assistance - Patient > 75% Assistive device: Walker-rolling Max distance: 62ft   Walk 10 feet activity   Assist  Walk 10 feet activity  did not occur: Safety/medical concerns  Assist level: Minimal Assistance - Patient > 75% Assistive device: Walker-rolling   Walk 50 feet activity   Assist Walk 50 feet with 2 turns activity did not occur: Safety/medical concerns         Walk 150 feet activity   Assist Walk 150 feet activity did not occur: Safety/medical concerns         Walk 10 feet on uneven surface  activity   Assist Walk 10 feet on uneven surfaces activity did not occur: Safety/medical concerns         Wheelchair     Assist Will patient use wheelchair at discharge?: Yes Type of Wheelchair: Manual    Wheelchair assist level: Supervision/Verbal cueing Max wheelchair distance: 130'    Wheelchair 50 feet with 2 turns activity    Assist        Assist Level: Supervision/Verbal cueing   Wheelchair 150 feet activity     Assist      Assist Level: Supervision/Verbal cueing     Medical Problem List and Plan: 1.  Generalized weakness affecting ability to stand or complete ADL tasks secondary to debility.  Continue CIR   Previously discussed with wife plans for discharge and goals of care 2.  Antithrombotics: -DVT/anticoagulation:  Mechanical: Sequential compression devices, below knee Bilateral lower extremities due to GI bleed.             -antiplatelet therapy: N/A 3. Pain Management: Tylenol prn.   Controlled with meds on 2/11 4. Mood: LCSW to follow for evaluation and support.              -antipsychotic agents: N/a 5. Neuropsych: This patient is capable of making decisions on his own behalf. 6. Skin/Wound Care: Routine pressure relief measures.   Discussed bedding and pressure relief for healing of sacral ulcer, discussed again, improving 7. Fluids/Electrolytes/Nutrition: Strict I/Os.  Labs with HD.discussed 1282ml fluid restriction  8. ESRD: On midodrine prior to HD to help support BP.   Permanent access as outpatient 9. Chronic systolic CHF/CAF: Strict I/Os. Fluid  status managed with HD. On Amiodarone 400 mg bid for rate control.              Daily weights  Lasix per Nephro, appreciate Recs Filed Weights   11/23/20 1530 11/23/20 1828 11/24/20 0500  Weight: 81.6 kg 80 kg 79.1 kg   Trending down on 2/11  Monitor heart rate with increased activity 10.  Pancreatitis: Has completed antibiotic course. Tolerating diet without nausea, diarrhea or abdominal pain.              Monitor closely.  Tolerating diet at present 11. LGIB: On Carafate ac/hs.  12. Acute on chronic anemia: On aranresp weekly.              Labs with HD, transfused on 2/5  Hemoglobin 7.0 on 2/10 will likely require another transfusion in HD -monitor if dizziness persists 13. Thrombocytopenia: Monitor for signs of bleeding.              Labs with HD  Resolved 14. Acute respiratory failure: Has resolved.   See #16 15.  Leukocytosis-some steroid effect  WBCs 12.1 on 2/10  Afebrile 16.  Steroid-induced hyperglycemia  SSI  Prednisone decreased to 20 on 2/4, decreased to 10 on 2/9, continue to wean  Controlled on 2/10  Continue to monitor 17.  Dizziness  Likely due to anemia and soft blood pressures and limited p.o. intake, patient not drinking nearly up to 1200cc fluid restriction, encouraged increase in fluids - discussed with nursing  LOS: 14 days A FACE TO FACE EVALUATION WAS PERFORMED  Abdi Husak Lorie Phenix 11/24/2020, 10:18 AM

## 2020-11-24 NOTE — Progress Notes (Signed)
Pt complaining of a lot of dizziness this morning during PT, and after PT with out relief, no other complaints of pain. Vitals were taken and CBG checked   BP:108/54 CBG: 112. MD made aware, new order to encourage fluids for pt.  fluids encouraged for pt not drinking enough due to dialysis yesterday. Pt given water, and juice.   Raymond Hurley

## 2020-11-24 NOTE — Progress Notes (Signed)
[facility] NEPHROLOGY PROGRESS NOTE  Assessment/ Plan:  # Acute kidney Injury: With baseline creatinine of 1.2 and acute kidney injury likely from ATN associated with Covid infection/acute pancreatitis and shock.  Unfortunately, remains anuric/with minimal urine output recorded and to continue hemodialysis on a TTS schedule while here (last dialyzed 2/5).  He is suspected to have evolved into end-stage renal disease with plan to pursue permanent access as an outpatient after he gets physically stronger. Status post HD on 2/10 with 1.5 L UF, tolerated well.  No sign of renal recovery so far.   Plan for next HD tomorrow.  # Acute pancreatitis: This was noted on CT scan earlier (1/3 and 1/7).  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  #  Acute hypoxic respiratory failure: Secondary to Covid pneumonia and possible aspiration pneumonitis.  Status post corticosteroids and supportive management-now on room air and without supplemental oxygen requirements.  #  Anemia: Secondary to acute/critical illness and GI losses.  Continue current dose of ESA.  IV iron prohibitive because of very high ferritin level.  Received PRBC.  #  Secondary hyperparathyroidism: Phosphorus level improved after starting sevelamer.  PTH level 217 at goal.  #Hypotension: On midodrine.  Blood pressure acceptable.  Subjective: Seen and examined.  No new event.  Tolerated dialysis well yesterday.  Denies nausea vomiting chest pain shortness of breath.  Objective Vital signs in last 24 hours: Vitals:   11/23/20 2002 11/24/20 0415 11/24/20 0500 11/24/20 0504  BP: (!) 105/55 (!) 101/56  (!) 108/47  Pulse: 75 71  77  Resp: 14 18  14   Temp: 98.7 F (37.1 C) 97.7 F (36.5 C)  98 F (36.7 C)  TempSrc:      SpO2: 100% 97%  100%  Weight:   79.1 kg   Height:       Weight change: 2.147 kg  Intake/Output Summary (Last 24 hours) at 11/24/2020  0954 Last data filed at 11/23/2020 1821 Gross per 24 hour  Intake 300 ml  Output 1500 ml  Net -1200 ml       Labs: Basic Metabolic Panel: Recent Labs  Lab 11/18/20 1403 11/21/20 0503 11/23/20 1606  NA 136 136 132*  K 3.8 4.3 3.7  CL 95* 96* 93*  CO2 16* 17* 21*  GLUCOSE 154* 86 119*  BUN 82* 100* 73*  CREATININE 7.15* 7.86* 7.19*  CALCIUM 9.2 9.5 8.6*  PHOS 5.8* 6.0* 4.9*   Liver Function Tests: Recent Labs  Lab 11/18/20 1403 11/21/20 0503 11/23/20 1606  ALBUMIN 2.8* 2.7* 2.7*   No results for input(s): LIPASE, AMYLASE in the last 168 hours. No results for input(s): AMMONIA in the last 168 hours. CBC: Recent Labs  Lab 11/18/20 1403 11/21/20 0503 11/23/20 1606  WBC 20.5* 18.5* 12.1*  HGB 6.9* 7.2* 7.0*  HCT 22.7* 22.0* 21.1*  MCV 97.0 93.2 94.6  PLT 191 178 196   Cardiac Enzymes: No results for input(s): CKTOTAL, CKMB, CKMBINDEX, TROPONINI in the last 168 hours. CBG: Recent Labs  Lab 11/22/20 1648 11/22/20 2053 11/23/20 0601 11/23/20 1157 11/23/20 2057  GLUCAP 113* 107* 80 102* 83    Iron Studies: No results for input(s): IRON, TIBC, TRANSFERRIN, FERRITIN in the last 72 hours. Studies/Results: No results found.  Medications: Infusions:   Scheduled Medications: . sodium chloride   Intravenous Once  . amiodarone  400 mg Oral BID  . B-complex with vitamin C  1 tablet Oral  Daily  . Chlorhexidine Gluconate Cloth  6 each Topical Q0600  . Chlorhexidine Gluconate Cloth  6 each Topical Q0600  . Chlorhexidine Gluconate Cloth  6 each Topical Q0600  . darbepoetin (ARANESP) injection - DIALYSIS  200 mcg Intravenous Q Tue-HD  . feeding supplement (NEPRO CARB STEADY)  237 mL Oral TID WC  . Gerhardt's butt cream   Topical TID  . insulin aspart  3-9 Units Subcutaneous TID WC  . lactobacillus acidophilus  2 tablet Oral TID  . lidocaine  1 patch Transdermal Q24H  . mouth rinse  15 mL Mouth Rinse BID  . midodrine  10 mg Oral TID WC  . predniSONE  10 mg  Oral Q breakfast  . sevelamer carbonate  800 mg Oral TID WC  . sucralfate  1 g Oral TID WC & HS  . triamcinolone 0.1 % cream : eucerin   Topical BID    have reviewed scheduled and prn medications.  Physical Exam: General: Not in distress, comfortable Heart:RRR, s1s2 nl Lungs:clear b/l, no crackle Abdomen:soft, Non-tender, non-distended Extremities: Trace ankle edema, stable Dialysis Access: Right IJ TDC placed by IR on 1/24.  Site clean  [employer] 11/24/2020,9:54 AM  LOS: 14 days

## 2020-11-24 NOTE — Progress Notes (Signed)
Occupational Therapy Session Note  Patient Details  Name: Raymond Hurley. MRN: [id number] Date of Birth: 1931/07/13  Today's Date: 11/24/2020 OT Individual Time: 1045-1110 OT Individual Time Calculation (min): 25 min  and Today's Date: 11/24/2020 OT Missed Time: 50 Minutes Missed Time Reason: Patient unwilling/refused to participate without medical reason;Patient fatigue   Short Term Goals: Week 2:  OT Short Term Goal 1 (Week 2): Pt will transfer to toilet with MIN A consistently OT Short Term Goal 2 (Week 2): Pt will stand to complete 1 grooming task to demo improved activity tolerance OT Short Term Goal 3 (Week 2): Pt will complete 2/3 toileting steps consistently OT Short Term Goal 4 (Week 2): Pt will don socks/shoes with min assist with AE PRN  Skilled Therapeutic Interventions/Progress Updates:    Limited treatment sessio with focus on sit > stand and functional transfers.  Pt received upright in w/c reporting extreme fatigue and dizziness during previous activity.  Pt reports having HD late yesterday and then not sleeping well overnight.  Pt reports dizziness during PT session and toileting attempts.  Pt reports desire to engage in therapy session, however reports that he "just can't" due to fatigue and dizziness.  Pt stated if he rests now, he could hopefully participate in PM session.  Pt completed sit > stand to RW with min assist and completed stand pivot transfer w/c > bed with min assist and RW.  Pt required mod assist to lift BLE in to bed.  Repositioned in semi-reclined position in bed to allow pt to rest.  BP assessed in supine 106/57, HR 76.  Therapist notified RN of fatigue and dizziness.  Therapy Documentation Precautions:  Precautions Precautions: Fall,Other (comment) Precaution Comments: Bowel incontinence (apparently had flexiseal removed yesterday) Restrictions Weight Bearing Restrictions: No General: General OT Amount of Missed Time: 50 Minutes Vital  Signs: Therapy Vitals Temp: (!) 97.5 F (36.4 C) Pulse Rate: 85 Resp: 17 BP: (!) 102/54 Patient Position (if appropriate): Sitting Oxygen Therapy SpO2: 98 % O2 Device: Room Air Pain: Pain Assessment Pain Scale: 0-10 Pain Score: 0-No pain   Therapy/Group: Individual Therapy  [name] 11/24/2020, 11:35 AM

## 2020-11-24 NOTE — Progress Notes (Signed)
Occupational Therapy Weekly Progress Note  Patient Details  Name:  Raymond Hurley. MRN: [id number] Date of Birth: 01/28/1931  Beginning of progress report period: November 17, 2020 End of progress report period: November 24, 2020   Patient has met 0 of 4 short term goals.  Pt is making slow progress towards due to inconsistent performance during therapy sessions.  Pt continues to have bouts of dizziness after standing 15-30 seconds and requires prolonged rest breaks to recover.  Dizziness seems to be worse on the day following HD.  Pt is able to compete sit > stand min-mod assist but is not consistently min assist.  Pt continues to require increased assistance with sit > stand from toilet and with toileting tasks.  Pt is unable to tolerate standing >30 seconds due to increase in dizziness.  Patient continues to demonstrate the following deficits: muscle weakness,decreased cardiorespiratoy enduranceand decreased sitting balance, decreased standing balance, decreased postural control and decreased balance strategies and therefore will continue to benefit from skilled OT intervention to enhance overall performance with BADL and Reduce care partner burden.  Patient progressing toward long term goals..  Continue plan of care.  OT Short Term Goals Week 2:  OT Short Term Goal 1 (Week 2): Pt will transfer to toilet with MIN A consistently OT Short Term Goal 1 - Progress (Week 2): Progressing toward goal OT Short Term Goal 2 (Week 2): Pt will stand to complete 1 grooming task to demo improved activity tolerance OT Short Term Goal 2 - Progress (Week 2): Progressing toward goal OT Short Term Goal 3 (Week 2): Pt will complete 2/3 toileting steps consistently OT Short Term Goal 3 - Progress (Week 2): Progressing toward goal OT Short Term Goal 4 (Week 2): Pt will don socks/shoes with min assist with AE PRN OT Short Term Goal 4 - Progress (Week 2): Progressing toward goal Week 3:  OT Short Term Goal 1  (Week 3): Pt will transfer to toilet with MIN A consistently OT Short Term Goal 2 (Week 3): Pt will stand to complete 1 grooming task to demo improved activity tolerance OT Short Term Goal 3 (Week 3): Pt will complete 2/3 toileting steps consistently OT Short Term Goal 4 (Week 3): Pt will don socks/shoes with min assist with AE PRN    Therapy/Group: Individual Therapy  ,  11/24/2020, 3:18 PM  

## 2020-11-24 NOTE — Progress Notes (Signed)
Physical Therapy Session Note  Patient Details  Name: Raymond Hurley. MRN: [id number] Date of Birth: 1931-01-11  Today's Date: 11/24/2020 PT Individual Time: [id number] and 0177-9390 PT Individual Time Calculation (min): 43 min and 57 min PT Missed Time: 18 minutes  PT Missed Time Reason: Fatigue   Short Term Goals: Week 1:  PT Short Term Goal 1 (Week 1): Pt will trasnfer to WC with CGA and LRAD PT Short Term Goal 1 - Progress (Week 1): Progressing toward goal PT Short Term Goal 2 (Week 1): Pt will performed bed mobility with CGA PT Short Term Goal 2 - Progress (Week 1): Progressing toward goal PT Short Term Goal 3 (Week 1): Pt will ambulate 109f with min assist and LRAD PT Short Term Goal 3 - Progress (Week 1): Progressing toward goal PT Short Term Goal 4 (Week 1): Pt will propell WC 1560fwith supervision assist PT Short Term Goal 4 - Progress (Week 1): Met PT Short Term Goal 5 (Week 1): pt will tolerate sitting up in WC >2 hours between therapies PT Short Term Goal 5 - Progress (Week 1): Met Week 2:  PT Short Term Goal 1 (Week 2): Pt will perform stand<>pivot transfer with LRAD and CGA PT Short Term Goal 2 (Week 2): Pt will ambulate 3079fith LRAD and min A PT Short Term Goal 3 (Week 2): Pt will perform bed mobility with CGA consistantly  Skilled Therapeutic Interventions/Progress Updates:   Treatment Session 1: [id number] min Received pt semi-reclined in bed, pt agreeable to therapy, and denied any pain during session but reported not sleeping well last night and with "low energy" today. Noted pt with increased dizziness this morning. Session with emphasis on functional mobility/transfers, toileting, generalized strengthening, dynamic standing balance/coordination, and improved activity tolerance. Pt reported wanting to use restroom to start off his "morning routine". Donned bilateral ted hose and non-skid socks with total A and nephrology MD present during session. Worked on  bed mobility and encouraged pt to practice rolling and getting up without bedrails (per bed setup at home) however pt ultimately required use bedrails to roll to L and transferred L sidelying<>sitting EOB with mod A and use of bedrails due to weakness and fatigue. Pt reported dizziness and required extensive rest break prior to [facility] stand<>pivot bed<>WC with RW and min A. Pt performed additional stand<>pivot transfer onto toilet with bedside commode over top with min A and required total A to doff brief/pants. Pt reported increased dizziness more than normal. BP taken: 104/48; pt reported improvements in symptoms with extensive seated rest break. Pt ultimately unsuccessful on toilet and transferred sit<>stand from commode with heavy mod A and [facility] [place] washed hands at sink with increased time due to c/o dizziness. Concluded session with pt sitting in WC, needs within reach, and seatbelt alarm on on. RN aware of pt's current status.   Treatment Session 2: [id number] 35n Received pt sitting on commode in bathroom holding onto [place] reported continued fatigue and dizziness from this morning and denied any relief in symptoms; ultimately requesting to return to bed after toileting. Pt with medium loose BM and transferred sit<>stand in [place] from bedside commode with heavy mod A and required total A for peri-care. Pt unable to stand long enough to complete peri-care, therefore required rest break sitting on Stedy flaps. Pt reported feeling more of an urge for BM and transferred stand<>sit back onto commode to  continue BM. Pt performed 2 additional sit<>stands in [place] with mod A for peri-care and to pull brief/pants up with total A. Pt's scaral dressing soiled during BM and RN made aware. Dependent transfer to bed via [place] and sit<>supine with mod A for BLE management. Pt scooted to [facility] with mod A and pulling up with BUE on headboard. BP in  supine: 123/63 and pt reported "coming back to it" and relief of dizziness upon lying down. Therapist provided pt with HEP and educated pt on frequency/duration/technique for the following exercises: -Supine Heel Slide - 1 x daily - 7 x weekly - 2 sets - 10 reps -Supine Ankle Pumps - 1 x daily - 7 x weekly - 3 sets - 10 reps -Supine Gluteal Sets - 1 x daily - 7 x weekly - 2 sets - 10 reps -Supine Hip Abduction - 1 x daily - 7 x weekly - 2 sets - 10 reps Assisted RN with clothing management and rolling pt to remove soiled sacral dressing and apply clean one. Concluded session with pt supine in bed, needs within reach, and bed alarm on. Positioned pillow underneath bilateral knees for pressure relief on low back. NT present at bedside. 18 minutes missed of skilled physical therapy due to fatigue.    Therapy Documentation Precautions:  Precautions Precautions: Fall,Other (comment) Precaution Comments: Bowel incontinence (apparently had flexiseal removed yesterday) Restrictions Weight Bearing Restrictions: No  Therapy/Group: Individual Therapy Alfonse Alpers PT, DPT   11/24/2020, 7:14 AM

## 2020-11-25 LAB — RENAL FUNCTION PANEL
Albumin: 2.8 g/dL — ABNORMAL LOW (ref 3.5–5.0)
Anion gap: 18 — ABNORMAL HIGH (ref 5–15)
BUN: 49 mg/dL — ABNORMAL HIGH (ref 8–23)
CO2: 20 mmol/L — ABNORMAL LOW (ref 22–32)
Calcium: 8.5 mg/dL — ABNORMAL LOW (ref 8.9–10.3)
Chloride: 96 mmol/L — ABNORMAL LOW (ref 98–111)
Creatinine, Ser: 6.53 mg/dL — ABNORMAL HIGH (ref 0.61–1.24)
GFR, Estimated: 8 mL/min — ABNORMAL LOW (ref >60)
Glucose, Bld: 127 mg/dL — ABNORMAL HIGH (ref 70–99)
Phosphorus: 3.2 mg/dL (ref 2.5–4.6)
Potassium: 3.7 mmol/L (ref 3.5–5.1)
Sodium: 134 mmol/L — ABNORMAL LOW (ref 135–145)

## 2020-11-25 LAB — GLUCOSE, CAPILLARY
Glucose-Capillary: 95 mg/dL (ref 70–99)
Glucose-Capillary: 107 mg/dL — ABNORMAL HIGH (ref 70–99)
Glucose-Capillary: 95 mg/dL (ref 70–99)
Glucose-Capillary: 121 mg/dL — ABNORMAL HIGH (ref 70–99)

## 2020-11-25 LAB — CBC
HCT: 25.2 % — ABNORMAL LOW (ref 39.0–52.0)
Hemoglobin: 7.6 g/dL — ABNORMAL LOW (ref 13.0–17.0)
MCH: 30 pg (ref 26.0–34.0)
MCHC: 30.2 g/dL (ref 30.0–36.0)
MCV: 99.6 fL (ref 80.0–100.0)
Platelets: 268 10*3/uL (ref 150–400)
RBC: 2.53 MIL/uL — ABNORMAL LOW (ref 4.22–5.81)
RDW: 27.1 % — ABNORMAL HIGH (ref 11.5–15.5)
WBC: 11.1 10*3/uL — ABNORMAL HIGH (ref 4.0–10.5)
nRBC: 0 % (ref 0.0–0.2)

## 2020-11-25 NOTE — Progress Notes (Signed)
[place] PHYSICAL MEDICINE & REHABILITATION PROGRESS NOTE   Subjective/Complaints: Pt notes that bp has been low at times. Has felt very tired by end of therapy the last two days  ROS: Patient denies fever, rash, sore throat, blurred vision, nausea, vomiting, diarrhea, cough, shortness of breath or chest pain, joint or back pain, headache, or mood change.   Objective:   No results found. Recent Labs    11/23/20 1606 11/25/20 1242  WBC 12.1* 11.1*  HGB 7.0* 7.6*  HCT 21.1* 25.2*  PLT 196 268   Recent Labs    11/23/20 1606  NA 132*  K 3.7  CL 93*  CO2 21*  GLUCOSE 119*  BUN 73*  CREATININE 7.19*  CALCIUM 8.6*    Intake/Output Summary (Last 24 hours) at 11/25/2020 1322 Last data filed at 11/25/2020 0820 Gross per 24 hour  Intake 680 ml  Output --  Net 680 ml     Pressure Injury 11/10/20 Buttocks Right;Mid Unstageable - Full thickness tissue loss in which the base of the injury is covered by slough (yellow, tan, gray, green or brown) and/or eschar (tan, brown or black) in the wound bed. red open area of partial s (Active)  11/10/20 1721  Location: Buttocks  Location Orientation: Right;Mid  Staging: Unstageable - Full thickness tissue loss in which the base of the injury is covered by slough (yellow, tan, gray, green or brown) and/or eschar (tan, brown or black) in the wound bed.  Wound Description (Comments): red open area of partial skin loss with eschar covering wound bed  Present on Admission: Yes    Physical Exam: Vital Signs Blood pressure 104/61, pulse 71, temperature 98.3 F (36.8 C), resp. rate 18, height 5\' 10"  (1.778 m), weight 80.7 kg, SpO2 100 %.  Constitutional: No distress . Vital signs reviewed. HEENT: EOMI, oral membranes moist Neck: supple Cardiovascular: RRR without murmur. No JVD    Respiratory/Chest: CTA Bilaterally without wheezes or rales. Normal effort    GI/Abdomen: BS +, non-tender, non-distended Ext: no clubbing, cyanosis, or  edema Psych: pleasant and cooperative Musc: Bilateral tr pedal edema No tenderness in extremities. Neuro: Alert and oriented x 3. Normal insight and awareness. Intact Memory. Normal language and speech. Cranial nerve exam unremarkable  Motor: Bilateral upper extremities: 4+/5 throughout, unchanged Left lower extremity: Hip flexion, knee extension 4-/5, ankle dorsiflexion 4+/5--stable motor Right lower extremity: Hip flexion, knee extension 3 +/5, ankle dorsiflexion 4+/5--stable motor  Assessment/Plan: 1. Functional deficits which require 3+ hours per day of interdisciplinary therapy in a comprehensive inpatient rehab setting.  Physiatrist is providing close team supervision and 24 hour management of active medical problems listed below.  Physiatrist and rehab team continue to assess barriers to discharge/monitor patient progress toward functional and medical goals  Care Tool:  Bathing    Body parts bathed by patient: Right arm,Left arm,Chest,Abdomen,Face   Body parts bathed by helper: Buttocks,Right lower leg,Left lower leg     Bathing assist Assist Level: Supervision/Verbal cueing     Upper Body Dressing/Undressing Upper body dressing   What is the patient wearing?: Pull over shirt,Button up shirt    Upper body assist Assist Level: Supervision/Verbal cueing    Lower Body Dressing/Undressing Lower body dressing      What is the patient wearing?: Pants     Lower body assist Assist for lower body dressing: Minimal Assistance - Patient > 75%     Toileting Toileting    Toileting assist Assist for toileting: Maximal Assistance - Patient 25 -  49%     Transfers Chair/bed transfer  Transfers assist     Chair/bed transfer assist level: Minimal Assistance - Patient > 75%     Locomotion Ambulation   Ambulation assist   Ambulation activity did not occur: Safety/medical concerns  Assist level: Minimal Assistance - Patient > 75% Assistive device:  Walker-rolling Max distance: 68ft   Walk 10 feet activity   Assist  Walk 10 feet activity did not occur: Safety/medical concerns  Assist level: Minimal Assistance - Patient > 75% Assistive device: Walker-rolling   Walk 50 feet activity   Assist Walk 50 feet with 2 turns activity did not occur: Safety/medical concerns         Walk 150 feet activity   Assist Walk 150 feet activity did not occur: Safety/medical concerns         Walk 10 feet on uneven surface  activity   Assist Walk 10 feet on uneven surfaces activity did not occur: Safety/medical concerns         Wheelchair     Assist Will patient use wheelchair at discharge?: Yes Type of Wheelchair: Manual    Wheelchair assist level: Supervision/Verbal cueing Max wheelchair distance: 130'    Wheelchair 50 feet with 2 turns activity    Assist        Assist Level: Supervision/Verbal cueing   Wheelchair 150 feet activity     Assist      Assist Level: Supervision/Verbal cueing     Medical Problem List and Plan: 1.  Generalized weakness affecting ability to stand or complete ADL tasks secondary to debility.  Continue CIR   Previously discussed with wife plans for discharge and goals of care 2.  Antithrombotics: -DVT/anticoagulation:  Mechanical: Sequential compression devices, below knee Bilateral lower extremities due to GI bleed.             -antiplatelet therapy: N/A 3. Pain Management: Tylenol prn.   Controlled with meds on 2/12 4. Mood: LCSW to follow for evaluation and support.              -antipsychotic agents: N/a 5. Neuropsych: This patient is capable of making decisions on his own behalf. 6. Skin/Wound Care: Routine pressure relief measures.   Discussed bedding and pressure relief for healing of sacral ulcer, discussed again, improving 7. Fluids/Electrolytes/Nutrition: Strict I/Os.  Labs with HD.discussed 1239ml fluid restriction  8. ESRD: On midodrine prior to HD to help  support BP.   Permanent access as outpatient 9. Chronic systolic CHF/CAF: Strict I/Os. Fluid status managed with HD. On Amiodarone 400 mg bid for rate control.              Daily weights  Lasix per Nephro, appreciate Recs Filed Weights   11/23/20 1828 11/24/20 0500 11/25/20 0432  Weight: 80 kg 79.1 kg 80.7 kg   Relatively stable on 2/12    10.  Pancreatitis: Has completed antibiotic course. Tolerating diet without nausea, diarrhea or abdominal pain.              Monitor closely.  Tolerating diet at present 11. LGIB: On Carafate ac/hs.  12. Acute on chronic anemia: On aranresp weekly.              Labs with HD, transfused on 2/5  Hemoglobin 7.0 on 2/10 ---> 7.6 2/12   -transfusion per nephrology. May need another unit to better tolerate therapy activities 13. Thrombocytopenia: Monitor for signs of bleeding.  Labs with HD  Resolved 14. Acute respiratory failure: Has resolved.   See #16 15.  Leukocytosis-some steroid effect  WBCs 11.1 on 2/12  Afebrile 16.  Steroid-induced hyperglycemia  SSI  Prednisone decreased to 20 on 2/4, decreased to 10 on 2/9, continue to wean  Controlled on 2/12    17.  Dizziness  Likely due to anemia and soft blood pressures and limited p.o. intake, patient not drinking nearly up to 1200cc fluid restriction   2/12 discussed again with patient. Needs to keep pushing   -continue midodrine   -volume mgt in HD per nephrology  LOS: 15 days A FACE TO [place] 11/25/2020, 1:22 PM

## 2020-11-25 NOTE — Progress Notes (Signed)
Physical Therapy Session Note  Patient Details  Name: Raymond Hurley. MRN: [id number] Date of Birth: [birth year]  Today's Date: 11/25/2020 PT Individual Time: 1000-1055 PT Individual Time Calculation (min): 55 min   Short Term Goals: Week 1:  PT Short Term Goal 1 (Week 1): Pt will trasnfer to WC with CGA and LRAD PT Short Term Goal 1 - Progress (Week 1): Progressing toward goal PT Short Term Goal 2 (Week 1): Pt will performed bed mobility with CGA PT Short Term Goal 2 - Progress (Week 1): Progressing toward goal PT Short Term Goal 3 (Week 1): Pt will ambulate 38f with min assist and LRAD PT Short Term Goal 3 - Progress (Week 1): Progressing toward goal PT Short Term Goal 4 (Week 1): Pt will propell WC 1535fwith supervision assist PT Short Term Goal 4 - Progress (Week 1): Met PT Short Term Goal 5 (Week 1): pt will tolerate sitting up in WC >2 hours between therapies PT Short Term Goal 5 - Progress (Week 1): Met Week 2:  PT Short Term Goal 1 (Week 2): Pt will perform stand<>pivot transfer with LRAD and CGA PT Short Term Goal 2 (Week 2): Pt will ambulate 3040fith LRAD and min A PT Short Term Goal 3 (Week 2): Pt will perform bed mobility with CGA consistantly  Skilled Therapeutic Interventions/Progress Updates:   Received pt slouched and sliding down in bed, pt agreeable to therapy, and denied any pain during session and reported feeling much better than yesterday. Session with emphasis on functional mobility/transfers, generalized strengthening, dynamic standing balance/coordination, and improved activity tolerance. Pt required increased time with all functional mobility due to poor activity tolerance and generalized weakness. BP: 108/54 in supine and pt requested not to wear ted hose today due to discomfort putting them on L heel. Educated pt on importance of wearing ted hose for edema and BP management and pt agreed. Carefully donned bilateral ted hose and non-skid socks with total A  and pt transferred supine<>sitting EOB with HOB elevated and use of bedrails with mod handheld assist for trunk control. Pt reported dizziness and sat EOB ~5 minutes with supervision until dizziness resolved. Nephrology MD present during session and pt transferred bed<>WC stand<>pivot with RW and min A. Pt brushed teeth, washed face and arms, combed hair, applied lotion, and cleaned glasses sitting in WC at sink with supervision and increased time with rest breaks throughout as pt stating "my strength doesn't last very long." Pt performed the following exercises [facility] and verbal cues for technique: -LAQ 2x15 bilaterally -hip flexion x12 bilaterally -hip abduction with light orange TB 2x12 -hip adduction pillow squeezes 2x12 Therapist provided pt with Valentine's Day card and stickers for activity to work on throughout the day. Concluded session with pt sitting in WC, needs within reach, and seatbelt alarm on.   Therapy Documentation Precautions:  Precautions Precautions: Fall,Other (comment) Precaution Comments: Bowel incontinence (apparently had flexiseal removed yesterday) Restrictions Weight Bearing Restrictions: No  Therapy/Group: Individual Therapy AnnAlfonse Alpers, DPT   11/25/2020, 7:23 AM

## 2020-11-25 NOTE — Progress Notes (Signed)
[facility] NEPHROLOGY PROGRESS NOTE  Assessment/ Plan:  # Acute kidney Injury: With baseline creatinine of 1.2 and acute kidney injury likely from ATN associated with Covid infection/acute pancreatitis and shock.  Unfortunately, remains anuric/with minimal urine output recorded and to continue hemodialysis on a TTS schedule while here (last dialyzed 2/5).  He is suspected to have evolved into end-stage renal disease with plan to pursue permanent access as an outpatient after he gets physically stronger. Status post HD on 2/10 with 1.5 L UF, tolerated well.  No sign of renal recovery so far.   Plan for next HD today.  # Acute pancreatitis: This was noted on CT scan earlier (1/3 and 1/7).  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  #  Acute hypoxic respiratory failure: Secondary to Covid pneumonia and possible aspiration pneumonitis.  Status post corticosteroids and supportive management-now on room air and without supplemental oxygen requirements.  #  Anemia: Secondary to acute/critical illness and GI losses.  Continue current dose of ESA.  IV iron prohibitive because of very high ferritin level.  Received PRBC.  #  Secondary hyperparathyroidism: Phosphorus level improved after starting sevelamer.  PTH level 217 at goal.  #Hypotension: On midodrine.  Blood pressure acceptable.  Subjective: Seen and examined.  Doing physical therapy.  Remains anuric.  No nausea, vomiting, chest pain or shortness of breath.  Objective Vital signs in last 24 hours: Vitals:   11/24/20 1007 11/24/20 1513 11/24/20 1937 11/25/20 0432  BP: (!) 102/54 (!) 103/56 (!) 110/53 (!) 107/47  Pulse: 85 78 78 76  Resp: 17 16 18 20   Temp: (!) 97.5 F (36.4 C) (!) 97.4 F (36.3 C) 98.3 F (36.8 C) 97.7 F (36.5 C)  TempSrc:   Oral Oral  SpO2: 98% (!) 77% 99% 92%  Weight:    80.7 kg  Height:       Weight change: -0.907  kg  Intake/Output Summary (Last 24 hours) at 11/25/2020 1103 Last data filed at 11/25/2020 0820 Gross per 24 hour  Intake 780 ml  Output --  Net 780 ml       Labs: Basic Metabolic Panel: Recent Labs  Lab 11/18/20 1403 11/21/20 0503 11/23/20 1606  NA 136 136 132*  K 3.8 4.3 3.7  CL 95* 96* 93*  CO2 16* 17* 21*  GLUCOSE 154* 86 119*  BUN 82* 100* 73*  CREATININE 7.15* 7.86* 7.19*  CALCIUM 9.2 9.5 8.6*  PHOS 5.8* 6.0* 4.9*   Liver Function Tests: Recent Labs  Lab 11/18/20 1403 11/21/20 0503 11/23/20 1606  ALBUMIN 2.8* 2.7* 2.7*   No results for input(s): LIPASE, AMYLASE in the last 168 hours. No results for input(s): AMMONIA in the last 168 hours. CBC: Recent Labs  Lab 11/18/20 1403 11/21/20 0503 11/23/20 1606  WBC 20.5* 18.5* 12.1*  HGB 6.9* 7.2* 7.0*  HCT 22.7* 22.0* 21.1*  MCV 97.0 93.2 94.6  PLT 191 178 196   Cardiac Enzymes: No results for input(s): CKTOTAL, CKMB, CKMBINDEX, TROPONINI in the last 168 hours. CBG: Recent Labs  Lab 11/24/20 1004 11/24/20 1134 11/24/20 1618 11/24/20 2105 11/25/20 0629  GLUCAP 112* 128* 103* 112* 95    Iron Studies: No results for input(s): IRON, TIBC, TRANSFERRIN, FERRITIN in the last 72 hours. Studies/Results: No results found.  Medications: Infusions:   Scheduled Medications: . sodium chloride   Intravenous Once  . amiodarone  400 mg Oral BID  . B-complex with vitamin  C  1 tablet Oral Daily  . Chlorhexidine Gluconate Cloth  6 each Topical Q0600  . Chlorhexidine Gluconate Cloth  6 each Topical Q0600  . Chlorhexidine Gluconate Cloth  6 each Topical Q0600  . Chlorhexidine Gluconate Cloth  6 each Topical Q0600  . darbepoetin (ARANESP) injection - DIALYSIS  200 mcg Intravenous Q Tue-HD  . feeding supplement (NEPRO CARB STEADY)  237 mL Oral TID WC  . Gerhardt's butt cream   Topical TID  . insulin aspart  3-9 Units Subcutaneous TID WC  . lactobacillus acidophilus  2 tablet Oral TID  . lidocaine  1 patch  Transdermal Q24H  . mouth rinse  15 mL Mouth Rinse BID  . midodrine  10 mg Oral TID WC  . predniSONE  10 mg Oral Q breakfast  . sevelamer carbonate  800 mg Oral TID WC  . sucralfate  1 g Oral TID WC & HS  . triamcinolone 0.1 % cream : eucerin   Topical BID    have reviewed scheduled and prn medications.  Physical Exam: General: Not in distress, comfortable Heart:RRR, s1s2 nl Lungs:clear b/l, no crackle Abdomen:soft, Non-tender, non-distended Extremities: Trace ankle edema, stable Dialysis Access: Right IJ TDC placed by IR on 1/24.  Site clean  Sharayah Renfrow [employer] 11/25/2020,11:03 AM  LOS: 15 days

## 2020-11-26 LAB — GLUCOSE, CAPILLARY
Glucose-Capillary: 104 mg/dL — ABNORMAL HIGH (ref 70–99)
Glucose-Capillary: 132 mg/dL — ABNORMAL HIGH (ref 70–99)
Glucose-Capillary: 133 mg/dL — ABNORMAL HIGH (ref 70–99)
Glucose-Capillary: 135 mg/dL — ABNORMAL HIGH (ref 70–99)

## 2020-11-26 MED ORDER — BISACODYL 10 MG RE SUPP: 10.0000 mg | Freq: Every day | RECTAL | Status: DC | PRN

## 2020-11-26 MED ORDER — POLYETHYLENE GLYCOL 3350 17 G PO PACK: 17.0000 g | PACK | Freq: Every day | ORAL | Status: DC | PRN

## 2020-11-26 MED ORDER — DOCUSATE SODIUM 100 MG PO CAPS
100.0000 mg | ORAL_CAPSULE | Freq: Two times a day (BID) | ORAL | Status: DC
  Administered 2020-11-26 – 2020-11-27 (×4): 100 mg via ORAL
  Filled 2020-11-26 (×5): qty 1

## 2020-11-26 NOTE — Progress Notes (Signed)
Pt has not been wanting to eat much, he wanted to wait til after he ate his lunch and dinner to take his medication both times to see if he would have would have more of an appetite but he still did not eat much, nepro given but would only finish less than half. Continued to encourage fluids, and food while eating meals. He is wanting to know if there is anything that can possibly help stimulate his appetite more. He is aware he needs to eat, and drink to get stronger. Nurse educated patient on the benefits of a healthy appetite, and the adequate amount of fluids.     Dayna Ramus, LPN

## 2020-11-26 NOTE — Progress Notes (Signed)
[facility] NEPHROLOGY PROGRESS NOTE  Assessment/ Plan:  # Acute kidney Injury: With baseline creatinine of 1.2 and acute kidney injury likely from ATN associated with Covid infection/acute pancreatitis and shock.  Unfortunately, remains anuric/with minimal urine output recorded and to continue hemodialysis on a TTS schedule while here (last dialyzed 2/5).  He is suspected to have evolved into end-stage renal disease with plan to pursue permanent access as an outpatient after he gets physically stronger. Status post HD on 2/12 with 2 L UF, tolerated well.  No sign of renal recovery so far.   Plan for next HD on 2/15.  # Acute pancreatitis: This was noted on CT scan earlier (1/3 and 1/7).  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  #  Acute hypoxic respiratory failure: Secondary to Covid pneumonia and possible aspiration pneumonitis.  Status post corticosteroids and supportive management-now on room air and without supplemental oxygen requirements.  #  Anemia: Secondary to acute/critical illness and GI losses.  Continue current dose of ESA.  IV iron prohibitive because of very high ferritin level.  Received PRBC.  #  Secondary hyperparathyroidism: Phosphorus level improved after starting sevelamer.  PTH level 217 at goal.  #Hypotension: On midodrine.  Blood pressure acceptable.  Subjective: Seen and examined.  Resting comfortably.  Denies nausea vomiting chest pain shortness of breath.  No urine output.  Objective Vital signs in last 24 hours: Vitals:   11/25/20 1837 11/25/20 1949 11/26/20 0521 11/26/20 0551  BP: (!) 103/55 (!) 104/59 95/71   Pulse: 79 87 85   Resp: 18 16 16    Temp: 98 F (36.7 C) 97.7 F (36.5 C) 97.6 F (36.4 C)   TempSrc: Oral  Oral   SpO2: 100% 99% 94%   Weight:    77.6 kg  Height:       Weight change: -0.04 kg  Intake/Output Summary (Last 24 hours) at 11/26/2020 1019 Last  data filed at 11/25/2020 2030 Gross per 24 hour  Intake 220 ml  Output 2000 ml  Net -1780 ml       Labs: Basic Metabolic Panel: Recent Labs  Lab 11/21/20 0503 11/23/20 1606 11/25/20 1242  NA 136 132* 134*  K 4.3 3.7 3.7  CL 96* 93* 96*  CO2 17* 21* 20*  GLUCOSE 86 119* 127*  BUN 100* 73* 49*  CREATININE 7.86* 7.19* 6.53*  CALCIUM 9.5 8.6* 8.5*  PHOS 6.0* 4.9* 3.2   Liver Function Tests: Recent Labs  Lab 11/21/20 0503 11/23/20 1606 11/25/20 1242  ALBUMIN 2.7* 2.7* 2.8*   No results for input(s): LIPASE, AMYLASE in the last 168 hours. No results for input(s): AMMONIA in the last 168 hours. CBC: Recent Labs  Lab 11/21/20 0503 11/23/20 1606 11/25/20 1242  WBC 18.5* 12.1* 11.1*  HGB 7.2* 7.0* 7.6*  HCT 22.0* 21.1* 25.2*  MCV 93.2 94.6 99.6  PLT 178 196 268   Cardiac Enzymes: No results for input(s): CKTOTAL, CKMB, CKMBINDEX, TROPONINI in the last 168 hours. CBG: Recent Labs  Lab 11/25/20 0629 11/25/20 1133 11/25/20 1845 11/25/20 2143 11/26/20 0619  GLUCAP 95 107* 95 121* 104*    Iron Studies: No results for input(s): IRON, TIBC, TRANSFERRIN, FERRITIN in the last 72 hours. Studies/Results: No results found.  Medications: Infusions:   Scheduled Medications: . sodium chloride   Intravenous Once  . amiodarone  400 mg Oral BID  . B-complex with vitamin C  1 tablet Oral Daily  .  Chlorhexidine Gluconate Cloth  6 each Topical Q0600  . Chlorhexidine Gluconate Cloth  6 each Topical Q0600  . Chlorhexidine Gluconate Cloth  6 each Topical Q0600  . Chlorhexidine Gluconate Cloth  6 each Topical Q0600  . darbepoetin (ARANESP) injection - DIALYSIS  200 mcg Intravenous Q Tue-HD  . feeding supplement (NEPRO CARB STEADY)  237 mL Oral TID WC  . Gerhardt's butt cream   Topical TID  . insulin aspart  3-9 Units Subcutaneous TID WC  . lactobacillus acidophilus  2 tablet Oral TID  . lidocaine  1 patch Transdermal Q24H  . mouth rinse  15 mL Mouth Rinse BID  .  midodrine  10 mg Oral TID WC  . predniSONE  10 mg Oral Q breakfast  . sevelamer carbonate  800 mg Oral TID WC  . sucralfate  1 g Oral TID WC & HS  . triamcinolone 0.1 % cream : eucerin   Topical BID    have reviewed scheduled and prn medications.  Physical Exam: General: Lying on bed comfortable, not in distress Heart:RRR, s1s2 nl Lungs: Clear b/l, no crackle Abdomen:soft, Non-tender, non-distended Extremities: Trace ankle edema, stable Dialysis Access: Right IJ TDC placed by IR on 1/24.  Site clean  [employer] 11/26/2020,10:19 AM  LOS: 16 days

## 2020-11-26 NOTE — Progress Notes (Signed)
[facility] PHYSICAL MEDICINE & REHABILITATION PROGRESS NOTE   Subjective/Complaints: Making an effort to drink more. Feels a little constipated.   ROS: Patient denies fever, rash, sore throat, blurred vision, nausea, vomiting, diarrhea, cough, shortness of breath or chest pain, joint or back pain, headache, or mood change.    Objective:   No results found. Recent Labs    11/23/20 1606 11/25/20 1242  WBC 12.1* 11.1*  HGB 7.0* 7.6*  HCT 21.1* 25.2*  PLT 196 268   Recent Labs    11/23/20 1606 11/25/20 1242  NA 132* 134*  K 3.7 3.7  CL 93* 96*  CO2 21* 20*  GLUCOSE 119* 127*  BUN 73* 49*  CREATININE 7.19* 6.53*  CALCIUM 8.6* 8.5*    Intake/Output Summary (Last 24 hours) at 11/26/2020 1114 Last data filed at 11/26/2020 1000 Gross per 24 hour  Intake 460 ml  Output 2000 ml  Net -1540 ml     Pressure Injury 11/10/20 Buttocks Right;Mid Unstageable - Full thickness tissue loss in which the base of the injury is covered by slough (yellow, tan, gray, green or brown) and/or eschar (tan, brown or black) in the wound bed. red open area of partial s (Active)  11/10/20 1721  Location: Buttocks  Location Orientation: Right;Mid  Staging: Unstageable - Full thickness tissue loss in which the base of the injury is covered by slough (yellow, tan, gray, green or brown) and/or eschar (tan, brown or black) in the wound bed.  Wound Description (Comments): red open area of partial skin loss with eschar covering wound bed  Present on Admission: Yes    Physical Exam: Vital Signs Blood pressure 95/71, pulse 85, temperature 97.6 F (36.4 C), temperature source Oral, resp. rate 16, height 5\' 10"  (1.778 m), weight 77.6 kg, SpO2 94 %.  Constitutional: No distress . Vital signs reviewed. HEENT: EOMI, oral membranes moist Neck: supple Cardiovascular: RRR without murmur. No JVD    Respiratory/Chest: CTA Bilaterally without wheezes or rales. Normal effort    GI/Abdomen: BS +, non-tender,  non-distended Ext: no clubbing, cyanosis  Psych: pleasant and cooperative Musc: Bilateral tr pedal edema ongoing No tenderness in extremities. Neuro: Alert and oriented x 3. Normal insight and awareness. Intact Memory. Normal language and speech. Cranial nerve exam unremarkable  Motor: Bilateral upper extremities: 4+/5 throughout, unchanged Left lower extremity: Hip flexion, knee extension 4-/5, ankle dorsiflexion 4+/5--stable Right lower extremity: Hip flexion, knee extension 3 +/5, ankle dorsiflexion 4+/5--stable  Assessment/Plan: 1. Functional deficits which require 3+ hours per day of interdisciplinary therapy in a comprehensive inpatient rehab setting.  Physiatrist is providing close team supervision and 24 hour management of active medical problems listed below.  Physiatrist and rehab team continue to assess barriers to discharge/monitor patient progress toward functional and medical goals  Care Tool:  Bathing    Body parts bathed by patient: Right arm,Left arm,Chest,Abdomen,Face   Body parts bathed by helper: Buttocks,Right lower leg,Left lower leg     Bathing assist Assist Level: Supervision/Verbal cueing     Upper Body Dressing/Undressing Upper body dressing   What is the patient wearing?: Pull over shirt,Button up shirt    Upper body assist Assist Level: Supervision/Verbal cueing    Lower Body Dressing/Undressing Lower body dressing      What is the patient wearing?: Pants     Lower body assist Assist for lower body dressing: Minimal Assistance - Patient > 75%     Toileting Toileting    Toileting assist Assist for toileting: Maximal Assistance -  Patient 25 - 49%     Transfers Chair/bed transfer  Transfers assist     Chair/bed transfer assist level: Minimal Assistance - Patient > 75%     Locomotion Ambulation   Ambulation assist   Ambulation activity did not occur: Safety/medical concerns  Assist level: Minimal Assistance - Patient >  75% Assistive device: Walker-rolling Max distance: 43ft   Walk 10 feet activity   Assist  Walk 10 feet activity did not occur: Safety/medical concerns  Assist level: Minimal Assistance - Patient > 75% Assistive device: Walker-rolling   Walk 50 feet activity   Assist Walk 50 feet with 2 turns activity did not occur: Safety/medical concerns         Walk 150 feet activity   Assist Walk 150 feet activity did not occur: Safety/medical concerns         Walk 10 feet on uneven surface  activity   Assist Walk 10 feet on uneven surfaces activity did not occur: Safety/medical concerns         Wheelchair     Assist Will patient use wheelchair at discharge?: Yes Type of Wheelchair: Manual    Wheelchair assist level: Supervision/Verbal cueing Max wheelchair distance: 130'    Wheelchair 50 feet with 2 turns activity    Assist        Assist Level: Supervision/Verbal cueing   Wheelchair 150 feet activity     Assist      Assist Level: Supervision/Verbal cueing     Medical Problem List and Plan: 1.  Generalized weakness affecting ability to stand or complete ADL tasks secondary to debility.  Continue CIR   Previously discussed with wife plans for discharge and goals of care 2.  Antithrombotics: -DVT/anticoagulation:  Mechanical: Sequential compression devices, below knee Bilateral lower extremities due to GI bleed.             -antiplatelet therapy: N/A 3. Pain Management: Tylenol prn.   Controlled with meds on 2/13 4. Mood: LCSW to follow for evaluation and support.              -antipsychotic agents: N/a 5. Neuropsych: This patient is capable of making decisions on his own behalf. 6. Skin/Wound Care: Routine pressure relief measures.   Discussed bedding and pressure relief for healing of sacral ulcer, discussed again, improving 7. Fluids/Electrolytes/Nutrition: Strict I/Os.  Labs with HD.discussed 1262ml fluid restriction  8. ESRD: On  midodrine prior to HD to help support BP.   Permanent access as outpatient 9. Chronic systolic CHF/CAF: Strict I/Os. Fluid status managed with HD. On Amiodarone 400 mg bid for rate control.              Daily weights  Lasix per Nephro, appreciate Recs Filed Weights   11/25/20 1528 11/25/20 1806 11/26/20 0551  Weight: 80.7 kg 78.7 kg 77.6 kg   Relatively stable on 2/13    10.  Pancreatitis: Has completed antibiotic course. Tolerating diet without nausea, diarrhea or abdominal pain.              Monitor closely.  Tolerating diet at present 11. LGIB: On Carafate ac/hs.  12. Acute on chronic anemia: On aranresp weekly.              Labs with HD, transfused on 2/5  Hemoglobin 7.0 on 2/10 ---> 7.6 2/12   -transfusion per nephrology. May need another unit to better tolerate therapy activities 13. Thrombocytopenia: Monitor for signs of bleeding.  Labs with HD  Resolved 14. Acute respiratory failure: Has resolved.   See #16 15.  Leukocytosis-some steroid effect  WBCs 11.1 on 2/12  Afebrile 16.  Steroid-induced hyperglycemia  SSI  Prednisone decreased to 20 on 2/4, decreased to 10 on 2/9, continue to wean  Controlled on 2/13    17.  Dizziness  Likely due to anemia and soft blood pressures and limited p.o. intake, patient not drinking nearly up to 1200cc fluid restriction   2/13 pt trying to drink more   -continue midodrine   -volume mgt in HD per nephrology 18. Slow transit constipation:  -scheduled colace  -last bm was 2/10  -add miralax prn as well  LOS: 16 days A FACE TO [place] 11/26/2020, 11:14 AM

## 2020-11-27 LAB — GLUCOSE, CAPILLARY
Glucose-Capillary: 101 mg/dL — ABNORMAL HIGH (ref 70–99)
Glucose-Capillary: 136 mg/dL — ABNORMAL HIGH (ref 70–99)
Glucose-Capillary: 86 mg/dL (ref 70–99)
Glucose-Capillary: 93 mg/dL (ref 70–99)

## 2020-11-27 MED ORDER — CHLORHEXIDINE GLUCONATE CLOTH 2 % EX PADS
6.0000 | MEDICATED_PAD | Freq: Every day | CUTANEOUS | Status: DC
  Administered 2020-11-27 – 2020-11-29 (×3): 6 via TOPICAL

## 2020-11-27 MED ORDER — PREDNISONE 5 MG PO TABS
5.0000 mg | ORAL_TABLET | Freq: Every day | ORAL | Status: DC
  Administered 2020-11-28 – 2020-12-01 (×4): 5 mg via ORAL
  Filled 2020-11-27 (×4): qty 1

## 2020-11-27 NOTE — Progress Notes (Signed)
Physical Therapy Session Note  Patient Details  Name: Raymond Hurley. MRN: [id number] Date of Birth: 1931/08/03  Today's Date: 11/27/2020 PT Individual Time: 1000-1030 PT Individual Time Calculation (min): 30 min   Short Term Goals: Week 2:  PT Short Term Goal 1 (Week 2): Pt will perform stand<>pivot transfer with LRAD and CGA PT Short Term Goal 2 (Week 2): Pt will ambulate 57ft with LRAD and min A PT Short Term Goal 3 (Week 2): Pt will perform bed mobility with CGA consistantly  Skilled Therapeutic Interventions/Progress Updates:    Patient received sitting up in bed, agreeable to PT. He reports pain in B ankle, L >R with increased pressure applied. PT donning TED hose and socks TotalA. He was able to come sit edge of bed with supervision, HOB elevated and verbal cues. Patient noting fatigue after doing this requesting rest break. He was able to transfer to wc via stand pivot with RW and CGA. Patient requesting to complete morning ADLs and was able to do so at the sink with set up assist. Hand off to RN to continue care.   Therapy Documentation Precautions:  Precautions Precautions: Fall,Other (comment) Precaution Comments: Bowel incontinence (apparently had flexiseal removed yesterday) Restrictions Weight Bearing Restrictions: (P) No    Therapy/Group: Individual Therapy  Karoline Caldwell, PT, DPT, CBIS  11/27/2020, 7:46 AM

## 2020-11-27 NOTE — Progress Notes (Signed)
Physical Therapy Session Note  Patient Details  Name: Raymond Hurley. MRN: [id number] Date of Birth: 06/03/1931  Today's Date: 11/27/2020 PT Individual Time: 1045-1200 PT Individual Time Calculation (min): 75 min   Short Term Goals: Week 2:  PT Short Term Goal 1 (Week 2): Pt will perform stand<>pivot transfer with LRAD and CGA PT Short Term Goal 2 (Week 2): Pt will ambulate 65ft with LRAD and min A PT Short Term Goal 3 (Week 2): Pt will perform bed mobility with CGA consistantly  Skilled Therapeutic Interventions/Progress Updates:    Patient in w/c and reports feeling better today since had BM and rest over the weekend.  States yesterday did not feel up to eating.  Patient propelled w/c x 150' with S.  Seated rest after w/c propulsion.  Patient sit to stand min A and ambulated min A x 30' with close w/c follow and goal directed toward chair at opposite end of the dayroom.  Patient given seated rest.  Sit to stand and stand pivot to Kinetron with min A.  Seated for 2 x 65min bouts on Kinetron at 45 cm/sec.  Patient stand pivot back to w/c with CGA from higher seat.  Pushed in w/c to room and pt sit to stand and pivot to EOB with min A.  Patient requesting to toilet and reported too dizzy/fatigued to go in w/c so used Stedy for dependent transfer to toilet in bathroom and pt needed assist with clothing and hygiene.  Patient assisted to w/c in [place].  Checked BP seated 112/54.  Patient declined standing to check.  Discussed progress and need for continued progress prior to home due to wife unable to assist.  Patient reported supposed to transition back to MWF schedule for HD.  RN made aware.  Patient left in w/c with alarm belt active and needs in reach.  Therapy Documentation Precautions:  Precautions Precautions: Fall,Other (comment) Precaution Comments: Bowel incontinence (apparently had flexiseal removed yesterday) Restrictions Weight Bearing Restrictions: (P) No Pain: Pain  Assessment Pain Score: 0-No pain   Therapy/Group: Individual Therapy  Reginia Naas  Magda Kiel, PT 11/27/2020, 11:35 AM

## 2020-11-27 NOTE — Progress Notes (Signed)
Occupational Therapy Session Note  Patient Details  Name: Raymond Hurley. MRN: [id number] Date of Birth: Mar 01, 1931  Today's Date: 11/27/2020 OT Individual Time: 1355-1505 OT Individual Time Calculation (min): 70 min    Short Term Goals: Week 3:  OT Short Term Goal 1 (Week 3): Pt will transfer to toilet with MIN A consistently OT Short Term Goal 2 (Week 3): Pt will stand to complete 1 grooming task to demo improved activity tolerance OT Short Term Goal 3 (Week 3): Pt will complete 2/3 toileting steps consistently OT Short Term Goal 4 (Week 3): Pt will don socks/shoes with min assist with AE PRN  Skilled Therapeutic Interventions/Progress Updates:    Treatment session with focus on self-care retraining, functional transfers, and dynamic standing balance.  Pt received upright in w/c with wife present.  Pt's wife excused self during therapy session.  Pt agreeable to bathing and dressing to include changing pants, per encouragement from wife.  Pt positioned in front of sink to complete UB bathing and dressing with supervision/setup.  Pt then reports need to toilet.  Completed stand pivot transfer w/c > BSC over toilet with RW with min assist.  Pt able to maintain standing with CGA to pull pants down over hips prior to toileting.  Pt required increased time due to sensation of need to have BM, however none this session.  Pt doffed dirty pants with increased time and use of reacher, able to don pants with reacher with increased time.  Pt completed sit > stand with mod assist from [facility] with RW.  Pt able to maintain standing with CGA while therapist completed hygiene and pulled incontinence brief and pants over hips.  Pt completed stand pivot transfer back to w/c with RW with min assist.  Pt reports dizziness post last transfer, passed within 1-2 mins.  Pt returned to sink to complete grooming tasks.  Pt then remained upright in w/c with seat belt alarm on, all needs in reach, and table in front to read  paper.  RN arriving to administer meds.    Therapy Documentation Precautions:  Precautions Precautions: Fall,Other (comment) Precaution Comments: Bowel incontinence (apparently had flexiseal removed yesterday) Restrictions Weight Bearing Restrictions: (P) No General:   Vital Signs: Therapy Vitals Temp: 97.6 F (36.4 C) Pulse Rate: 90 Resp: 17 BP: 102/84 Patient Position (if appropriate): Sitting Oxygen Therapy SpO2: 99 % O2 Device: Room Air Pain:  Pt with no c/o pain   Therapy/Group: Individual Therapy  [name] 11/27/2020, 3:42 PM

## 2020-11-27 NOTE — Progress Notes (Signed)
[facility] ROUNDING NOTE   Subjective:   Interval History: This is a 87 year old gentleman who remained anuric oliguric following Covid infection.  He is now CIR requiring intensive physical therapy.  His baseline creatinine is 1.2.  However he has been dialysis dependent since admission and is now on a Tuesday Thursday Saturday dialysis schedule.  There is no recorded urine output.  His last dialysis 11/25/2020 he underwent 2 L ultrafiltration.  Blood pressure 98/53 pulse 82 temperature 97.5 O2 sats 98% room air  Sodium 134 potassium 3.7 chloride 96 CO2 20 BUN 49 creatinine 6.53 glucose 127 calcium 8.5 phosphorus 3.2 albumin 2.8 hemoglobin 7.6  Objective:  Vital signs in last 24 hours:  Temp:  [97.5 F (36.4 C)-97.7 F (36.5 C)] 97.5 F (36.4 C) (02/14 0526) Pulse Rate:  [82-87] 82 (02/14 0526) Resp:  [14-17] 17 (02/14 0526) BP: (98-110)/(53-61) 98/53 (02/14 0526) SpO2:  [96 %-98 %] 98 % (02/14 0526) Weight:  [79.4 kg] 79.4 kg (02/14 0500)  Weight change: -1.321 kg Filed Weights   11/25/20 1806 11/26/20 0551 11/27/20 0500  Weight: 78.7 kg 77.6 kg 79.4 kg    Intake/Output: I/O last 3 completed shifts: In: 86 [P.O.:820] Out: -    Intake/Output this shift:  No intake/output data recorded.  CVS- RRR RS- CTA ABD- BS present soft non-distended EXT- no edema   Basic Metabolic Panel: Recent Labs  Lab 11/21/20 0503 11/23/20 1606 11/25/20 1242  NA 136 132* 134*  K 4.3 3.7 3.7  CL 96* 93* 96*  CO2 17* 21* 20*  GLUCOSE 86 119* 127*  BUN 100* 73* 49*  CREATININE 7.86* 7.19* 6.53*  CALCIUM 9.5 8.6* 8.5*  PHOS 6.0* 4.9* 3.2    Liver Function Tests: Recent Labs  Lab 11/21/20 0503 11/23/20 1606 11/25/20 1242  ALBUMIN 2.7* 2.7* 2.8*   No results for input(s): LIPASE, AMYLASE in the last 168 hours. No results for input(s): AMMONIA in the last 168 hours.  CBC: Recent Labs  Lab 11/21/20 0503 11/23/20 1606 11/25/20 1242  WBC 18.5* 12.1* 11.1*   HGB 7.2* 7.0* 7.6*  HCT 22.0* 21.1* 25.2*  MCV 93.2 94.6 99.6  PLT 178 196 268    Cardiac Enzymes: No results for input(s): CKTOTAL, CKMB, CKMBINDEX, TROPONINI in the last 168 hours.  BNP: Invalid input(s): POCBNP  CBG: Recent Labs  Lab 11/26/20 0619 11/26/20 1151 11/26/20 1711 11/26/20 2111 11/27/20 0621  GLUCAP 104* 132* 133* 135* 101*    Microbiology: Results for orders placed or performed during the hospital encounter of 10/14/20  Resp Panel by RT-PCR (Flu A&B, Covid) Nasopharyngeal Swab     Status: Abnormal   Collection Time: 10/14/20  2:21 AM   Specimen: Nasopharyngeal Swab; Nasopharyngeal(NP) swabs in vial transport medium  Result Value Ref Range Status   SARS Coronavirus 2 by RT PCR POSITIVE (A) NEGATIVE Final    Comment: RESULT CALLED TO, READ BACK BY AND VERIFIED WITH: G CERER RN 10/14/20 0349 JDW (NOTE) SARS-CoV-2 target nucleic acids are DETECTED.  The SARS-CoV-2 RNA is generally detectable in upper respiratory specimens during the acute phase of infection. Positive results are indicative of the presence of the identified virus, but do not rule out bacterial infection or co-infection with other pathogens not detected by the test. Clinical correlation with patient history and other diagnostic information is necessary to determine patient infection status. The expected result is Negative.  Fact Sheet for Patients: EntrepreneurPulse.com.au  Fact Sheet for Healthcare Providers: IncredibleEmployment.be  This test is not yet  approved or cleared by the [place] and  has been authorized for detection and/or diagnosis of SARS-CoV-2 by FDA under an Emergency Use Authorization (EUA).  This EUA will remain in effect (meaning this test can be used)  for the duration of  the COVID-19 declaration under Section 564(b)(1) of the Act, 21 U.S.C. section 360bbb-3(b)(1), unless the authorization is terminated or revoked  sooner.     Influenza A by PCR NEGATIVE NEGATIVE Final   Influenza B by PCR NEGATIVE NEGATIVE Final    Comment: (NOTE) The Xpert Xpress SARS-CoV-2/FLU/RSV plus assay is intended as an aid in the diagnosis of influenza from Nasopharyngeal swab specimens and should not be used as a sole basis for treatment. Nasal washings and aspirates are unacceptable for Xpert Xpress SARS-CoV-2/FLU/RSV testing.  Fact Sheet for Patients: EntrepreneurPulse.com.au  Fact Sheet for Healthcare Providers: IncredibleEmployment.be  This test is not yet approved or cleared by the [place] FDA and has been authorized for detection and/or diagnosis of SARS-CoV-2 by FDA under an Emergency Use Authorization (EUA). This EUA will remain in effect (meaning this test can be used) for the duration of the COVID-19 declaration under Section 564(b)(1) of the Act, 21 U.S.C. section 360bbb-3(b)(1), unless the authorization is terminated or revoked.  Performed at [facility], [place] [address] [place], [place] [address]   Culture, blood (routine x 2)     Status: None   Collection Time: 10/14/20 10:36 AM   Specimen: BLOOD RIGHT HAND  Result Value Ref Range Status   Specimen Description BLOOD RIGHT HAND  Final   Special Requests   Final    BOTTLES DRAWN AEROBIC AND ANAEROBIC Blood Culture adequate volume   Culture   Final    NO GROWTH 5 DAYS Performed at [facility], [place] [address], [place], [place] [address]    Report Status 10/19/2020 FINAL  Final  Culture, blood (routine x 2)     Status: None   Collection Time: 10/14/20 10:36 AM   Specimen: BLOOD LEFT HAND  Result Value Ref Range Status   Specimen Description BLOOD LEFT HAND  Final   Special Requests   Final    BOTTLES DRAWN AEROBIC AND ANAEROBIC Blood Culture results may not be optimal due to an inadequate volume of blood received in culture bottles   Culture   Final    NO GROWTH 5 DAYS Performed at [facility], [place] [address] [place], [place] [address]    Report Status 10/19/2020 FINAL  Final  Gastrointestinal Panel by PCR , Stool     Status: None   Collection Time: 10/14/20 11:00 AM   Specimen: Stool  Result Value Ref Range Status   Campylobacter species NOT DETECTED NOT DETECTED Final   Plesimonas shigelloides NOT DETECTED NOT DETECTED Final   Salmonella species NOT DETECTED NOT DETECTED Final   Yersinia enterocolitica NOT DETECTED NOT DETECTED Final   Vibrio species NOT DETECTED NOT DETECTED Final   Vibrio cholerae NOT DETECTED NOT DETECTED Final   Enteroaggregative E coli (EAEC) NOT DETECTED NOT DETECTED Final   Enteropathogenic E coli (EPEC) NOT DETECTED NOT DETECTED Final   Enterotoxigenic E coli (ETEC) NOT DETECTED NOT DETECTED Final   Shiga like toxin producing E coli (STEC) NOT DETECTED NOT DETECTED Final   Shigella/Enteroinvasive E coli (EIEC) NOT DETECTED NOT DETECTED Final   Cryptosporidium NOT DETECTED NOT DETECTED Final   Cyclospora cayetanensis NOT DETECTED NOT DETECTED Final   Entamoeba histolytica NOT DETECTED NOT  DETECTED Final   Giardia lamblia NOT DETECTED NOT DETECTED Final   Adenovirus F40/41 NOT DETECTED NOT DETECTED Final   Astrovirus NOT DETECTED NOT DETECTED Final   Norovirus GI/GII NOT DETECTED NOT DETECTED Final   Rotavirus A NOT DETECTED NOT DETECTED Final   Sapovirus (I, II, IV, and V) NOT DETECTED NOT DETECTED Final    Comment: Performed at [facility], [place], [place] [address]  C Difficile Quick Screen w PCR reflex     Status: None   Collection Time: 10/14/20 11:00 AM   Specimen: STOOL  Result Value Ref Range Status   C Diff antigen NEGATIVE NEGATIVE Final   C Diff toxin NEGATIVE NEGATIVE Final   C Diff interpretation No C. difficile detected.  Final    Comment: Performed at [facility], [place] [address], [place], [place] [address]  MRSA PCR Screening     Status: None   Collection Time: 10/15/20 12:21 PM    Specimen: Nasal Mucosa; Nasopharyngeal  Result Value Ref Range Status   MRSA by PCR NEGATIVE NEGATIVE Final    Comment:        The GeneXpert MRSA Assay (FDA approved for NASAL specimens only), is one component of a comprehensive MRSA colonization surveillance program. It is not intended to diagnose MRSA infection nor to guide or monitor treatment for MRSA infections. Performed at [facility], [place] [address] [place], [place] [address]   Culture, blood (Routine X 2) w Reflex to ID Panel     Status: None   Collection Time: 10/20/20  4:40 PM   Specimen: BLOOD RIGHT HAND  Result Value Ref Range Status   Specimen Description BLOOD RIGHT HAND  Final   Special Requests   Final    BOTTLES DRAWN AEROBIC AND ANAEROBIC Blood Culture adequate volume   Culture   Final    NO GROWTH 5 DAYS Performed at [facility], [place] [address], [place], [place] [address]    Report Status 10/25/2020 FINAL  Final  Culture, blood (Routine X 2) w Reflex to ID Panel     Status: None   Collection Time: 10/20/20  4:43 PM   Specimen: BLOOD  Result Value Ref Range Status   Specimen Description BLOOD LEFT ANTECUBITAL  Final   Special Requests   Final    BOTTLES DRAWN AEROBIC ONLY Blood Culture results may not be optimal due to an inadequate volume of blood received in culture bottles   Culture   Final    NO GROWTH 5 DAYS Performed at [facility], [place] [address], [place], [place] [address]    Report Status 10/25/2020 FINAL  Final  MRSA PCR Screening     Status: None   Collection Time: 10/21/20 10:08 AM   Specimen: Nasal Mucosa; Nasopharyngeal  Result Value Ref Range Status   MRSA by PCR NEGATIVE NEGATIVE Final    Comment:        The GeneXpert MRSA Assay (FDA approved for NASAL specimens only), is one component of a comprehensive MRSA colonization surveillance program. It is not intended to diagnose MRSA infection nor to guide or monitor treatment for MRSA infections. Performed at  [facility], [place] [address], [place],  [address]     Coagulation Studies: No results for input(s): LABPROT, INR in the last 72 hours.  Urinalysis: No results for input(s): COLORURINE, LABSPEC, PHURINE, GLUCOSEU, HGBUR, BILIRUBINUR, KETONESUR, PROTEINUR, UROBILINOGEN, NITRITE, LEUKOCYTESUR in the last 72 hours.  Invalid input(s): APPERANCEUR    Imaging: No results found.   Medications:    . sodium chloride   Intravenous Once  . amiodarone  400 mg Oral BID  . B-complex with vitamin C  1 tablet Oral Daily  . Chlorhexidine Gluconate Cloth  6 each Topical Q0600  . Chlorhexidine Gluconate Cloth  6 each Topical Q0600  . Chlorhexidine Gluconate Cloth  6 each Topical Q0600  . Chlorhexidine Gluconate Cloth  6 each Topical Q0600  . darbepoetin (ARANESP) injection - DIALYSIS  200 mcg Intravenous Q Tue-HD  . docusate sodium  100 mg Oral BID  . feeding supplement (NEPRO CARB STEADY)  237 mL Oral TID WC  . Gerhardt's butt cream   Topical TID  . insulin aspart  3-9 Units Subcutaneous TID WC  . lactobacillus acidophilus  2 tablet Oral TID  . lidocaine  1 patch Transdermal Q24H  . mouth rinse  15 mL Mouth Rinse BID  . midodrine  10 mg Oral TID WC  . predniSONE  10 mg Oral Q breakfast  . sevelamer carbonate  800 mg Oral TID WC  . sucralfate  1 g Oral TID WC & HS  . triamcinolone 0.1 % cream : eucerin   Topical BID   acetaminophen, bisacodyl, calcium carbonate, chlorpheniramine-HYDROcodone, diphenhydrAMINE, loperamide, polyethylene glycol, prochlorperazine **OR** prochlorperazine **OR** prochlorperazine, Resource ThickenUp Clear, traZODone  Assessment/ Plan:  1. Acute kidney Injury: With baseline creatinine of 1.2 and acute kidney injury likely from ATN associated with Covid infection/acute pancreatitis and shock. Unfortunately, remains anuric/with minimal urine output recordedandto continuehemodialysis on a TTS schedule while here (last dialyzed 2/5).He is suspected to  have evolved into end-stage renal disease with plan to pursue permanent access as an outpatient after he gets physically stronger. Status post HD on 2/12 with 2 L UF, tolerated well.  No sign of renal recovery so far.   Plan for next HD on 2/15.  2.Acute pancreatitis:This was noted on CT scan earlier (1/3 and 1/7). 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  3. Acute hypoxic respiratory failure:Secondary to Covid pneumonia and possible aspiration pneumonitis. Status post corticosteroids and supportive management-now on room air and without supplemental oxygen requirements.  4. Anemia: Secondary to acute/critical illness and GI losses.Continue current dose of ESA.  IV iron prohibitive because of very high ferritin level.  Received PRBC.  5 Secondary hyperparathyroidism:Phosphorus level improved after starting sevelamer.  PTH level 217 at goal.  6 Hypotension: On midodrine.  Blood pressure acceptable.   LOS: [place] @TODAY @10 :55 AM

## 2020-11-27 NOTE — Progress Notes (Signed)
Slept well throughout the night. Turned and repositioned every 2 hours. Vital signs stable this am. No prn medications given.

## 2020-11-27 NOTE — Progress Notes (Signed)
PHYSICAL MEDICINE & REHABILITATION PROGRESS NOTE   Subjective/Complaints: Patient seen sitting up in bed this AM.  He states he slept well overnight.  He states he had a good weekend.  He states he feels good this AM.  He notes he is having BMs.  He is trying to drink more and eat more. He was seen by Nephro yesterday, no changes.   ROS: Denies CP, SOB, N/V/D  Objective:   No results found. Recent Labs    11/25/20 1242  WBC 11.1*  HGB 7.6*  HCT 25.2*  PLT 268   Recent Labs    11/25/20 1242  NA 134*  K 3.7  CL 96*  CO2 20*  GLUCOSE 127*  BUN 49*  CREATININE 6.53*  CALCIUM 8.5*    Intake/Output Summary (Last 24 hours) at 11/27/2020 1643 Last data filed at 11/27/2020 1300 Gross per 24 hour  Intake 540 ml  Output --  Net 540 ml     Pressure Injury 11/10/20 Buttocks Right;Mid Unstageable - Full thickness tissue loss in which the base of the injury is covered by slough (yellow, tan, gray, green or brown) and/or eschar (tan, brown or black) in the wound bed. red open area of partial s (Active)  11/10/20 1721  Location: Buttocks  Location Orientation: Right;Mid  Staging: Unstageable - Full thickness tissue loss in which the base of the injury is covered by slough (yellow, tan, gray, green or brown) and/or eschar (tan, brown or black) in the wound bed.  Wound Description (Comments): red open area of partial skin loss with eschar covering wound bed  Present on Admission: Yes    Physical Exam: Vital Signs Blood pressure 102/84, pulse 90, temperature 97.6 F (36.4 C), resp. rate 17, height 5\' 10"  (1.778 m), weight 79.4 kg, SpO2 99 %.  Constitutional: No distress . Vital signs reviewed. HENT: Normocephalic.  Atraumatic. Eyes: EOMI. No discharge. Cardiovascular: No JVD.  RRR. Respiratory: Normal effort.  No stridor.  Bilateral clear to auscultation. GI: Non-distended.  BS +. Skin: Warm and dry.  Intact. Psych: Normal mood.  Normal behavior. Musc: No edema in  extremities.  No tenderness in extremities. Neuro: Alert  Motor: Bilateral upper extremities: 4+/5 throughout, unchanged Left lower extremity: Hip flexion, knee extension 4-/5, ankle dorsiflexion 4+/5, unchanged Right lower extremity: Hip flexion, knee extension 3 +-4-/5, ankle dorsiflexion 4+/5  Assessment/Plan: 1. Functional deficits which require 3+ hours per day of interdisciplinary therapy in a comprehensive inpatient rehab setting.  Physiatrist is providing close team supervision and 24 hour management of active medical problems listed below.  Physiatrist and rehab team continue to assess barriers to discharge/monitor patient progress toward functional and medical goals  Care Tool:  Bathing    Body parts bathed by patient: Right arm,Left arm,Chest,Abdomen,Face   Body parts bathed by helper: Buttocks,Right lower leg,Left lower leg     Bathing assist Assist Level: Supervision/Verbal cueing     Upper Body Dressing/Undressing Upper body dressing   What is the patient wearing?: Pull over shirt,Button up shirt    Upper body assist Assist Level: Supervision/Verbal cueing    Lower Body Dressing/Undressing Lower body dressing      What is the patient wearing?: Pants     Lower body assist Assist for lower body dressing: Minimal Assistance - Patient > 75%     Toileting Toileting    Toileting assist Assist for toileting: Dependent - Patient 0%     Transfers Chair/bed transfer  Transfers assist  Chair/bed transfer assist level: Contact Guard/Touching assist Chair/bed transfer assistive device: Programmer, multimedia   Ambulation assist   Ambulation activity did not occur: Safety/medical concerns  Assist level: Minimal Assistance - Patient > 75% Assistive device: Walker-rolling Max distance: 30   Walk 10 feet activity   Assist  Walk 10 feet activity did not occur: Safety/medical concerns  Assist level: Minimal Assistance - Patient >  75% Assistive device: Walker-rolling   Walk 50 feet activity   Assist Walk 50 feet with 2 turns activity did not occur: Safety/medical concerns         Walk 150 feet activity   Assist Walk 150 feet activity did not occur: Safety/medical concerns         Walk 10 feet on uneven surface  activity   Assist Walk 10 feet on uneven surfaces activity did not occur: Safety/medical concerns         Wheelchair     Assist Will patient use wheelchair at discharge?: Yes Type of Wheelchair: Manual    Wheelchair assist level: Supervision/Verbal cueing Max wheelchair distance: 130'    Wheelchair 50 feet with 2 turns activity    Assist        Assist Level: Supervision/Verbal cueing   Wheelchair 150 feet activity     Assist      Assist Level: Supervision/Verbal cueing     Medical Problem List and Plan: 1.  Generalized weakness affecting ability to stand or complete ADL tasks secondary to debility.  Continue CIR   Previously discussed with wife plans for discharge and goals of care 2.  Antithrombotics: -DVT/anticoagulation:  Mechanical: Sequential compression devices, below knee Bilateral lower extremities due to GI bleed.             -antiplatelet therapy: N/A 3. Pain Management: Tylenol prn.   Controlled with meds on 2/14 4. Mood: LCSW to follow for evaluation and support.              -antipsychotic agents: N/a 5. Neuropsych: This patient is capable of making decisions on his own behalf. 6. Skin/Wound Care: Routine pressure relief measures.   Discussed bedding and pressure relief for healing of sacral ulcer, discussed again, improving 7. Fluids/Electrolytes/Nutrition: Strict I/Os.  Labs with HD.discussed 1240ml fluid restriction  8. ESRD: On midodrine prior to HD to help support BP.   Permanent access as outpatient 9. Chronic systolic CHF/CAF: Strict I/Os. Fluid status managed with HD. On Amiodarone 400 mg bid for rate control.              Daily  weights  Lasix per Nephro, appreciate Recs Filed Weights   11/25/20 1806 11/26/20 0551 11/27/20 0500  Weight: 78.7 kg 77.6 kg 79.4 kg   Relatively stable on 2/14 10.  Pancreatitis: Has completed antibiotic course. Tolerating diet without nausea, diarrhea or abdominal pain.              Monitor closely.  Tolerating diet at present 11. LGIB: On Carafate ac/hs.  12. Acute on chronic anemia: On aranresp weekly.              Labs with HD, transfused on 2/5  Hemoglobin 7.6 on 2/12 13. Thrombocytopenia: Monitor for signs of bleeding.              Labs with HD  Resolved 14. Acute respiratory failure: Has resolved.   See #16 15.  Leukocytosis-some steroid effect  WBCs 11.1 on 2/12  Afebrile 16.  Steroid-induced hyperglycemia  SSI  Prednisone decreased to 20 on 2/4, decreased to 10 on 2/9, decreased to 5 on 2/15  Labile on 2/14   17.  Dizziness  Likely due to anemia and soft blood pressures and limited p.o. intake, patient not drinking nearly up to 1200cc fluid restriction  Trying to drink more  Continue midodrine 18. Slow transit constipation:  Improving  LOS: 17 days A FACE TO FACE EVALUATION WAS PERFORMED  Raymond Hurley Lorie Phenix 11/27/2020, 4:43 PM

## 2020-11-28 LAB — CBC
HCT: 22.9 % — ABNORMAL LOW (ref 39.0–52.0)
Hemoglobin: 7 g/dL — ABNORMAL LOW (ref 13.0–17.0)
MCH: 30.6 pg (ref 26.0–34.0)
MCHC: 30.6 g/dL (ref 30.0–36.0)
MCV: 100 fL (ref 80.0–100.0)
Platelets: 210 10*3/uL (ref 150–400)
RBC: 2.29 MIL/uL — ABNORMAL LOW (ref 4.22–5.81)
RDW: 25.5 % — ABNORMAL HIGH (ref 11.5–15.5)
WBC: 10.3 10*3/uL (ref 4.0–10.5)
nRBC: 0 % (ref 0.0–0.2)

## 2020-11-28 LAB — GLUCOSE, CAPILLARY
Glucose-Capillary: 82 mg/dL (ref 70–99)
Glucose-Capillary: 97 mg/dL (ref 70–99)
Glucose-Capillary: 106 mg/dL — ABNORMAL HIGH (ref 70–99)
Glucose-Capillary: 116 mg/dL — ABNORMAL HIGH (ref 70–99)

## 2020-11-28 LAB — RENAL FUNCTION PANEL
Albumin: 2.5 g/dL — ABNORMAL LOW (ref 3.5–5.0)
Anion gap: 17 — ABNORMAL HIGH (ref 5–15)
BUN: 71 mg/dL — ABNORMAL HIGH (ref 8–23)
CO2: 20 mmol/L — ABNORMAL LOW (ref 22–32)
Calcium: 8.3 mg/dL — ABNORMAL LOW (ref 8.9–10.3)
Chloride: 94 mmol/L — ABNORMAL LOW (ref 98–111)
Creatinine, Ser: 8.45 mg/dL — ABNORMAL HIGH (ref 0.61–1.24)
GFR, Estimated: 6 mL/min — ABNORMAL LOW (ref >60)
Glucose, Bld: 125 mg/dL — ABNORMAL HIGH (ref 70–99)
Phosphorus: 2.8 mg/dL (ref 2.5–4.6)
Potassium: 3.7 mmol/L (ref 3.5–5.1)
Sodium: 131 mmol/L — ABNORMAL LOW (ref 135–145)

## 2020-11-28 MED ORDER — ALTEPLASE 2 MG IJ SOLR: 2.0000 mg | Freq: Once | INTRAMUSCULAR | Status: DC | PRN

## 2020-11-28 MED ORDER — SODIUM CHLORIDE 0.9 % IV SOLN: 100.0000 mL | INTRAVENOUS | Status: DC | PRN

## 2020-11-28 MED ORDER — SEVELAMER CARBONATE 0.8 G PO PACK
0.8000 g | PACK | Freq: Three times a day (TID) | ORAL | Status: DC
  Administered 2020-11-28 – 2020-12-04 (×19): 0.8 g via ORAL
  Filled 2020-11-28 (×21): qty 1

## 2020-11-28 MED ORDER — DOCUSATE SODIUM 50 MG/5ML PO LIQD
100.0000 mg | Freq: Two times a day (BID) | ORAL | Status: DC
  Administered 2020-11-28 – 2020-12-06 (×14): 100 mg via ORAL
  Filled 2020-11-28 (×19): qty 10

## 2020-11-28 MED ORDER — COLLAGENASE 250 UNIT/GM EX OINT
TOPICAL_OINTMENT | Freq: Every day | CUTANEOUS | Status: DC
  Filled 2020-11-28: qty 30

## 2020-11-28 MED ORDER — PENTAFLUOROPROP-TETRAFLUOROETH EX AERO
1.0000 | INHALATION_SPRAY | CUTANEOUS | Status: DC | PRN
Start: 2020-11-28 — End: 2020-11-28

## 2020-11-28 MED ORDER — LIDOCAINE HCL (PF) 1 % IJ SOLN
5.0000 mL | INTRAMUSCULAR | Status: DC | PRN
  Filled 2020-11-28: qty 5

## 2020-11-28 MED ORDER — HEPARIN SODIUM (PORCINE) 1000 UNIT/ML DIALYSIS
1000.0000 [IU] | INTRAMUSCULAR | Status: DC | PRN
  Filled 2020-11-28: qty 1

## 2020-11-28 MED ORDER — HEPARIN SODIUM (PORCINE) 1000 UNIT/ML IJ SOLN
INTRAMUSCULAR | Status: AC
  Filled 2020-11-28: qty 4

## 2020-11-28 MED ORDER — DARBEPOETIN ALFA 200 MCG/0.4ML IJ SOSY
PREFILLED_SYRINGE | INTRAMUSCULAR | Status: AC
  Administered 2020-11-28: 200 ug via INTRAVENOUS
  Filled 2020-11-28: qty 0.4

## 2020-11-28 MED ORDER — LIDOCAINE-PRILOCAINE 2.5-2.5 % EX CREA
1.0000 "application " | TOPICAL_CREAM | CUTANEOUS | Status: DC | PRN
  Filled 2020-11-28: qty 5

## 2020-11-28 NOTE — Progress Notes (Signed)
Occupational Therapy Session Note  Patient Details  Name: Raymond Hurley. MRN: [id number] Date of Birth: 12-10-1930  Today's Date: 11/28/2020 OT Individual Time: 1015-1100 OT Individual Time Calculation (min): 45 min    Short Term Goals: Week 2:  OT Short Term Goal 1 (Week 2): Pt will transfer to toilet with MIN A consistently OT Short Term Goal 1 - Progress (Week 2): Progressing toward goal OT Short Term Goal 2 (Week 2): Pt will stand to complete 1 grooming task to demo improved activity tolerance OT Short Term Goal 2 - Progress (Week 2): Progressing toward goal OT Short Term Goal 3 (Week 2): Pt will complete 2/3 toileting steps consistently OT Short Term Goal 3 - Progress (Week 2): Progressing toward goal OT Short Term Goal 4 (Week 2): Pt will don socks/shoes with min assist with AE PRN OT Short Term Goal 4 - Progress (Week 2): Progressing toward goal  Skilled Therapeutic Interventions/Progress Updates:    Patient seated in w/c, denies pain but states that he is exhausted.  He is talkative t/o session.  Completed upperbody ergometer 2 x 4 minutes, w/c push ups, trunk mobility.  Unable to stand this session due to fatigue.  He remained seated in w/c at close of session, seat belt alarm set and call bell in reach.    Therapy Documentation Precautions:  Precautions Precautions: Fall,Other (comment) Precaution Comments: Bowel incontinence (apparently had flexiseal removed yesterday) Restrictions Weight Bearing Restrictions: (P) No   Therapy/Group: Individual Therapy  Carlos Levering 11/28/2020, 7:40 AM

## 2020-11-28 NOTE — Progress Notes (Addendum)
Unable to run ordered BFR 400 d/t positional catheter currently at BFR 250. Dr. Justin Mend made aware orders to complete tx as tolerated, will notify IR for HD catheter exchange. Orders to decrease tx time to 3hrs.

## 2020-11-28 NOTE — Plan of Care (Signed)
  Problem: Consults Goal: RH GENERAL PATIENT EDUCATION Description: See Patient Education module for education specifics. Outcome: Progressing Goal: Skin Care Protocol Initiated - if Braden Score 18 or less Description: If consults are not indicated, leave blank or document N/A Outcome: Progressing   Problem: RH BOWEL ELIMINATION Goal: RH STG MANAGE BOWEL WITH ASSISTANCE Description: STG Manage Bowel with mod I Assistance. Outcome: Progressing   Problem: RH BLADDER ELIMINATION Goal: RH STG MANAGE BLADDER WITH ASSISTANCE Description: STG Manage Bladder With mod I Assistance Outcome: Progressing   Problem: RH SKIN INTEGRITY Goal: RH STG SKIN FREE OF INFECTION/BREAKDOWN Description: Breakdown to skin will improve according to weekly measurements with min assist Outcome: Progressing Goal: RH STG MAINTAIN SKIN INTEGRITY WITH ASSISTANCE Description: STG Maintain Skin Integrity With min Assistance. Outcome: Progressing Goal: RH STG ABLE TO PERFORM INCISION/WOUND CARE W/ASSISTANCE Description: STG Able To Perform Incision/Wound Care With min Assistance. Outcome: Progressing   Problem: RH SAFETY Goal: RH STG ADHERE TO SAFETY PRECAUTIONS W/ASSISTANCE/DEVICE Description: STG Adhere to Safety Precautions With cues/reminders  Assistance/Device. Outcome: Progressing   Problem: RH PAIN MANAGEMENT Goal: RH STG PAIN MANAGED AT OR BELOW PT'S PAIN GOAL Description: Pain will be managed at or below 3 out of 10 on pain scale with min assist Outcome: Progressing   Problem: RH KNOWLEDGE DEFICIT GENERAL Goal: RH STG INCREASE KNOWLEDGE OF SELF CARE AFTER HOSPITALIZATION Description: Pt will increase knowledge of self care with min assit from materials provided to pt by nursing and therapy Outcome: Progressing

## 2020-11-28 NOTE — Progress Notes (Signed)
[place] PHYSICAL MEDICINE & REHABILITATION PROGRESS NOTE   Subjective/Complaints: Patient seen sitting up in bed this morning working with therapies.  He states he slept well overnight.  He states he feels good this morning.  He notes improvement in dizziness.  Discussed dizziness with therapies as well, appears to be associate with HD.  Patient believes that HD takes "a lot out of me ".  He asks again if I am the kidney doctor.  He was seen by nephrology yesterday, notes reviewed-no changes.  Discussed fluid intake with patient again.  ROS: Denies CP, SOB, N/V/D  Objective:   No results found. Recent Labs    11/25/20 1242  WBC 11.1*  HGB 7.6*  HCT 25.2*  PLT 268   Recent Labs    11/25/20 1242  NA 134*  K 3.7  CL 96*  CO2 20*  GLUCOSE 127*  BUN 49*  CREATININE 6.53*  CALCIUM 8.5*    Intake/Output Summary (Last 24 hours) at 11/28/2020 1002 Last data filed at 11/27/2020 1900 Gross per 24 hour  Intake 420 ml  Output -  Net 420 ml     Pressure Injury 11/10/20 Buttocks Right;Mid Unstageable - Full thickness tissue loss in which the base of the injury is covered by slough (yellow, tan, gray, green or brown) and/or eschar (tan, brown or black) in the wound bed. red open area of partial s (Active)  11/10/20 1721  Location: Buttocks  Location Orientation: Right;Mid  Staging: Unstageable - Full thickness tissue loss in which the base of the injury is covered by slough (yellow, tan, gray, green or brown) and/or eschar (tan, brown or black) in the wound bed.  Wound Description (Comments): red open area of partial skin loss with eschar covering wound bed  Present on Admission: Yes    Physical Exam: Vital Signs Blood pressure 100/60, pulse 76, temperature 98.1 F (36.7 C), temperature source Oral, resp. rate 16, height 5\' 10"  (1.778 m), weight 81.6 kg, SpO2 98 %.  Constitutional: No distress . Vital signs reviewed. HENT: Normocephalic.  Atraumatic. Eyes: EOMI. No  discharge. Cardiovascular: No JVD.  RRR. Respiratory: Normal effort.  No stridor.  Bilateral clear to auscultation. GI: Non-distended.  BS +. Skin: Warm and dry.  Intact. Psych: Normal mood.  Normal behavior. Musc: No edema in extremities.  No tenderness in extremities. Neuro: Alert Motor: Bilateral upper extremities: 4+/5 throughout, stable Left lower extremity: Hip flexion, knee extension 4-/5, ankle dorsiflexion 4+/5, stable Right lower extremity: Hip flexion, knee extension 3 +-4-/5, ankle dorsiflexion 4+/5  Assessment/Plan: 1. Functional deficits which require 3+ hours per day of interdisciplinary therapy in a comprehensive inpatient rehab setting.  Physiatrist is providing close team supervision and 24 hour management of active medical problems listed below.  Physiatrist and rehab team continue to assess barriers to discharge/monitor patient progress toward functional and medical goals  Care Tool:  Bathing    Body parts bathed by patient: Right arm,Left arm,Chest,Abdomen,Face,Front perineal area   Body parts bathed by helper: Buttocks     Bathing assist Assist Level: Minimal Assistance - Patient > 75%     Upper Body Dressing/Undressing Upper body dressing   What is the patient wearing?: Pull over shirt,Button up shirt    Upper body assist Assist Level: Supervision/Verbal cueing    Lower Body Dressing/Undressing Lower body dressing      What is the patient wearing?: Underwear/pull up,Pants     Lower body assist Assist for lower body dressing: Minimal Assistance - Patient > 75%  Toileting Toileting    Toileting assist Assist for toileting: Maximal Assistance - Patient 25 - 49%     Transfers Chair/bed transfer  Transfers assist     Chair/bed transfer assist level: Minimal Assistance - Patient > 75% Chair/bed transfer assistive device: Programmer, multimedia   Ambulation assist   Ambulation activity did not occur: Safety/medical  concerns  Assist level: Minimal Assistance - Patient > 75% Assistive device: Walker-rolling Max distance: 30   Walk 10 feet activity   Assist  Walk 10 feet activity did not occur: Safety/medical concerns  Assist level: Minimal Assistance - Patient > 75% Assistive device: Walker-rolling   Walk 50 feet activity   Assist Walk 50 feet with 2 turns activity did not occur: Safety/medical concerns         Walk 150 feet activity   Assist Walk 150 feet activity did not occur: Safety/medical concerns         Walk 10 feet on uneven surface  activity   Assist Walk 10 feet on uneven surfaces activity did not occur: Safety/medical concerns         Wheelchair     Assist Will patient use wheelchair at discharge?: Yes Type of Wheelchair: Manual    Wheelchair assist level: Supervision/Verbal cueing Max wheelchair distance: 130'    Wheelchair 50 feet with 2 turns activity    Assist        Assist Level: Supervision/Verbal cueing   Wheelchair 150 feet activity     Assist      Assist Level: Supervision/Verbal cueing     Medical Problem List and Plan: 1.  Generalized weakness affecting ability to stand or complete ADL tasks secondary to debility.  Continue CIR   Previously discussed with wife plans for discharge and goals of care 2.  Antithrombotics: -DVT/anticoagulation:  Mechanical: Sequential compression devices, below knee Bilateral lower extremities due to GI bleed.             -antiplatelet therapy: N/A 3. Pain Management: Tylenol prn.   Controlled with meds on 2/15 4. Mood: LCSW to follow for evaluation and support.              -antipsychotic agents: N/a 5. Neuropsych: This patient is capable of making decisions on his own behalf. 6. Skin/Wound Care: Routine pressure relief measures.   Discussed bedding and pressure relief for healing of sacral ulcer, discussed again, improving 7. Fluids/Electrolytes/Nutrition: Strict I/Os.  Labs with  HD.discussed 1242ml fluid restriction  8. ESRD: On midodrine prior to HD to help support BP.   Permanent access as outpatient 9. Chronic systolic CHF/CAF: Strict I/Os. Fluid status managed with HD. On Amiodarone 400 mg bid for rate control.              Daily weights  Lasix per Nephro, appreciate Recs Filed Weights   11/26/20 0551 11/27/20 0500 11/28/20 0438  Weight: 77.6 kg 79.4 kg 81.6 kg   ?  Trending up on 2/15 10.  Pancreatitis: Has completed antibiotic course. Tolerating diet without nausea, diarrhea or abdominal pain.              Monitor closely.  Tolerating diet at present 11. LGIB: On Carafate ac/hs.  12. Acute on chronic anemia: On aranresp weekly.              Labs with HD, transfused on 2/5  Hemoglobin 7.6 on 2/12 13. Thrombocytopenia: Monitor for signs of bleeding.  Labs with HD  Resolved 14. Acute respiratory failure: Has resolved.   See #16 15.  Leukocytosis-some steroid effect  WBCs 11.1 on 2/12  Afebrile 16.  Steroid-induced hyperglycemia  SSI  Prednisone decreased to 20 on 2/4, decreased to 10 on 2/9, decreased to 5 on 2/15  Improving on 2/15   17.  Dizziness  Likely due to anemia and soft blood pressures and limited p.o. intake in accordance with HD, patient not drinking nearly up to 1200cc fluid restriction  Trying to drink more, discussed again  Continue midodrine 18. Slow transit constipation:  Improving  LOS: 18 days A FACE TO FACE EVALUATION WAS PERFORMED  Ankit Lorie Phenix 11/28/2020, 10:02 AM

## 2020-11-28 NOTE — Progress Notes (Signed)
[facility] ROUNDING NOTE   Subjective:   Interval History: This is a 87 year old gentleman who remained anuric oliguric following Covid infection.  He is now CIR requiring intensive physical therapy.  His baseline creatinine is 1.2.  However he has been dialysis dependent since admission and is now on a Tuesday Thursday Saturday dialysis schedule.  There is no recorded urine output.  His last dialysis 11/25/2020 he underwent 2 L ultrafiltration.  Blood pressure 100/60 pulse 76 temperature 98.1 O2 sats 98% room air  Sodium 134 potassium 3.7 chloride 96 CO2 20 BUN 49 creatinine 6.53 glucose 127 calcium 8.5 phosphorus 3.2 albumin 2.8 hemoglobin 7.6   Objective:  Vital signs in last 24 hours:  Temp:  [97.6 F (36.4 C)-98.1 F (36.7 C)] 98.1 F (36.7 C) (02/15 0438) Pulse Rate:  [75-90] 76 (02/15 0438) Resp:  [16-17] 16 (02/15 0438) BP: (100-121)/(60-84) 100/60 (02/15 0438) SpO2:  [93 %-99 %] 98 % (02/15 0438) Weight:  [81.6 kg] 81.6 kg (02/15 0438)  Weight change: 2.268 kg Filed Weights   11/26/20 0551 11/27/20 0500 11/28/20 0438  Weight: 77.6 kg 79.4 kg 81.6 kg    Intake/Output: I/O last 3 completed shifts: In: 720 [P.O.:720] Out: -    Intake/Output this shift:  No intake/output data recorded.  General: Lying on bed comfortable, not in distress Heart:RRR, s1s2 nl Lungs: Clear b/l, no crackle Abdomen:soft, Non-tender, non-distended Extremities: Trace ankle edema, stable Dialysis Access: Right IJ TDC placed by IR on 1/24.  Site clean   Basic Metabolic Panel: Recent Labs  Lab 11/23/20 1606 11/25/20 1242  NA 132* 134*  K 3.7 3.7  CL 93* 96*  CO2 21* 20*  GLUCOSE 119* 127*  BUN 73* 49*  CREATININE 7.19* 6.53*  CALCIUM 8.6* 8.5*  PHOS 4.9* 3.2    Liver Function Tests: Recent Labs  Lab 11/23/20 1606 11/25/20 1242  ALBUMIN 2.7* 2.8*   No results for input(s): LIPASE, AMYLASE in the last 168 hours. No results for input(s): AMMONIA in the last  168 hours.  CBC: Recent Labs  Lab 11/23/20 1606 11/25/20 1242  WBC 12.1* 11.1*  HGB 7.0* 7.6*  HCT 21.1* 25.2*  MCV 94.6 99.6  PLT 196 268    Cardiac Enzymes: No results for input(s): CKTOTAL, CKMB, CKMBINDEX, TROPONINI in the last 168 hours.  BNP: Invalid input(s): POCBNP  CBG: Recent Labs  Lab 11/27/20 0621 11/27/20 1210 11/27/20 1627 11/27/20 2122 11/28/20 0607  GLUCAP 101* 136* 86 93 82    Microbiology: Results for orders placed or performed during the hospital encounter of 10/14/20  Resp Panel by RT-PCR (Flu A&B, Covid) Nasopharyngeal Swab     Status: Abnormal   Collection Time: 10/14/20  2:21 AM   Specimen: Nasopharyngeal Swab; Nasopharyngeal(NP) swabs in vial transport medium  Result Value Ref Range Status   SARS Coronavirus 2 by RT PCR POSITIVE (A) NEGATIVE Final    Comment: RESULT CALLED TO, READ BACK BY AND VERIFIED WITH: G CERER RN 10/14/20 0349 JDW (NOTE) SARS-CoV-2 target nucleic acids are DETECTED.  The SARS-CoV-2 RNA is generally detectable in upper respiratory specimens during the acute phase of infection. Positive results are indicative of the presence of the identified virus, but do not rule out bacterial infection or co-infection with other pathogens not detected by the test. Clinical correlation with patient history and other diagnostic information is necessary to determine patient infection status. The expected result is Negative.  Fact Sheet for Patients: EntrepreneurPulse.com.au  Fact Sheet for Healthcare Providers: IncredibleEmployment.be  This test is not yet approved or cleared by the [place] and  has been authorized for detection and/or diagnosis of SARS-CoV-2 by FDA under an Emergency Use Authorization (EUA).  This EUA will remain in effect (meaning this test can be used)  for the duration of  the COVID-19 declaration under Section 564(b)(1) of the Act, 21 U.S.C. section  360bbb-3(b)(1), unless the authorization is terminated or revoked sooner.     Influenza A by PCR NEGATIVE NEGATIVE Final   Influenza B by PCR NEGATIVE NEGATIVE Final    Comment: (NOTE) The Xpert Xpress SARS-CoV-2/FLU/RSV plus assay is intended as an aid in the diagnosis of influenza from Nasopharyngeal swab specimens and should not be used as a sole basis for treatment. Nasal washings and aspirates are unacceptable for Xpert Xpress SARS-CoV-2/FLU/RSV testing.  Fact Sheet for Patients: EntrepreneurPulse.com.au  Fact Sheet for Healthcare Providers: IncredibleEmployment.be  This test is not yet approved or cleared by the [place] FDA and has been authorized for detection and/or diagnosis of SARS-CoV-2 by FDA under an Emergency Use Authorization (EUA). This EUA will remain in effect (meaning this test can be used) for the duration of the COVID-19 declaration under Section 564(b)(1) of the Act, 21 U.S.C. section 360bbb-3(b)(1), unless the authorization is terminated or revoked.  Performed at [facility], [place] [address] [place], [place] [address]   Culture, blood (routine x 2)     Status: None   Collection Time: 10/14/20 10:36 AM   Specimen: BLOOD RIGHT HAND  Result Value Ref Range Status   Specimen Description BLOOD RIGHT HAND  Final   Special Requests   Final    BOTTLES DRAWN AEROBIC AND ANAEROBIC Blood Culture adequate volume   Culture   Final    NO GROWTH 5 DAYS Performed at [facility], [place] [address], [place], [place] [address]    Report Status 10/19/2020 FINAL  Final  Culture, blood (routine x 2)     Status: None   Collection Time: 10/14/20 10:36 AM   Specimen: BLOOD LEFT HAND  Result Value Ref Range Status   Specimen Description BLOOD LEFT HAND  Final   Special Requests   Final    BOTTLES DRAWN AEROBIC AND ANAEROBIC Blood Culture results may not be optimal due to an inadequate volume of blood received in culture  bottles   Culture   Final    NO GROWTH 5 DAYS Performed at [facility], [place] [address] [place], [place] [address]    Report Status 10/19/2020 FINAL  Final  Gastrointestinal Panel by PCR , Stool     Status: None   Collection Time: 10/14/20 11:00 AM   Specimen: Stool  Result Value Ref Range Status   Campylobacter species NOT DETECTED NOT DETECTED Final   Plesimonas shigelloides NOT DETECTED NOT DETECTED Final   Salmonella species NOT DETECTED NOT DETECTED Final   Yersinia enterocolitica NOT DETECTED NOT DETECTED Final   Vibrio species NOT DETECTED NOT DETECTED Final   Vibrio cholerae NOT DETECTED NOT DETECTED Final   Enteroaggregative E coli (EAEC) NOT DETECTED NOT DETECTED Final   Enteropathogenic E coli (EPEC) NOT DETECTED NOT DETECTED Final   Enterotoxigenic E coli (ETEC) NOT DETECTED NOT DETECTED Final   Shiga like toxin producing E coli (STEC) NOT DETECTED NOT DETECTED Final   Shigella/Enteroinvasive E coli (EIEC) NOT DETECTED NOT DETECTED Final   Cryptosporidium NOT DETECTED NOT DETECTED Final   Cyclospora cayetanensis NOT DETECTED NOT DETECTED Final  Entamoeba histolytica NOT DETECTED NOT DETECTED Final   Giardia lamblia NOT DETECTED NOT DETECTED Final   Adenovirus F40/41 NOT DETECTED NOT DETECTED Final   Astrovirus NOT DETECTED NOT DETECTED Final   Norovirus GI/GII NOT DETECTED NOT DETECTED Final   Rotavirus A NOT DETECTED NOT DETECTED Final   Sapovirus (I, II, IV, and V) NOT DETECTED NOT DETECTED Final    Comment: Performed at [facility], [place] [place], [place] [address]  C Difficile Quick Screen w PCR reflex     Status: None   Collection Time: 10/14/20 11:00 AM   Specimen: STOOL  Result Value Ref Range Status   C Diff antigen NEGATIVE NEGATIVE Final   C Diff toxin NEGATIVE NEGATIVE Final   C Diff interpretation No C. difficile detected.  Final    Comment: Performed at [facility], [place] [address] [place], [place] [address]  MRSA PCR  Screening     Status: None   Collection Time: 10/15/20 12:21 PM   Specimen: Nasal Mucosa; Nasopharyngeal  Result Value Ref Range Status   MRSA by PCR NEGATIVE NEGATIVE Final    Comment:        The GeneXpert MRSA Assay (FDA approved for NASAL specimens only), is one component of a comprehensive MRSA colonization surveillance program. It is not intended to diagnose MRSA infection nor to guide or monitor treatment for MRSA infections. Performed at [facility], [place] [address], [place], [place] [address]   Culture, blood (Routine X 2) w Reflex to ID Panel     Status: None   Collection Time: 10/20/20  4:40 PM   Specimen: BLOOD RIGHT HAND  Result Value Ref Range Status   Specimen Description BLOOD RIGHT HAND  Final   Special Requests   Final    BOTTLES DRAWN AEROBIC AND ANAEROBIC Blood Culture adequate volume   Culture   Final    NO GROWTH 5 DAYS Performed at [facility], [place] [address], [place], [place] [address]    Report Status 10/25/2020 FINAL  Final  Culture, blood (Routine X 2) w Reflex to ID Panel     Status: None   Collection Time: 10/20/20  4:43 PM   Specimen: BLOOD  Result Value Ref Range Status   Specimen Description BLOOD LEFT ANTECUBITAL  Final   Special Requests   Final    BOTTLES DRAWN AEROBIC ONLY Blood Culture results may not be optimal due to an inadequate volume of blood received in culture bottles   Culture   Final    NO GROWTH 5 DAYS Performed at [facility], [place] [address] [place], [place] [address]    Report Status 10/25/2020 FINAL  Final  MRSA PCR Screening     Status: None   Collection Time: 10/21/20 10:08 AM   Specimen: Nasal Mucosa; Nasopharyngeal  Result Value Ref Range Status   MRSA by PCR NEGATIVE NEGATIVE Final    Comment:        The GeneXpert MRSA Assay (FDA approved for NASAL specimens only), is one component of a comprehensive MRSA colonization surveillance program. It is not intended to diagnose MRSA infection nor  to guide or monitor treatment for MRSA infections. Performed at [facility], [place] [address], [place], [place] [address]     Coagulation Studies: No results for input(s): LABPROT, INR in the last 72 hours.  Urinalysis: No results for input(s): COLORURINE, LABSPEC, PHURINE, GLUCOSEU, HGBUR, BILIRUBINUR, KETONESUR, PROTEINUR, UROBILINOGEN, NITRITE, LEUKOCYTESUR in  the last 72 hours.  Invalid input(s): APPERANCEUR    Imaging: No results found.   Medications:    . sodium chloride   Intravenous Once  . amiodarone  400 mg Oral BID  . B-complex with vitamin C  1 tablet Oral Daily  . Chlorhexidine Gluconate Cloth  6 each Topical Q0600  . darbepoetin (ARANESP) injection - DIALYSIS  200 mcg Intravenous Q Tue-HD  . docusate sodium  100 mg Oral BID  . feeding supplement (NEPRO CARB STEADY)  237 mL Oral TID WC  . Gerhardt's butt cream   Topical TID  . insulin aspart  3-9 Units Subcutaneous TID WC  . lactobacillus acidophilus  2 tablet Oral TID  . lidocaine  1 patch Transdermal Q24H  . mouth rinse  15 mL Mouth Rinse BID  . midodrine  10 mg Oral TID WC  . predniSONE  5 mg Oral Q breakfast  . sevelamer carbonate  800 mg Oral TID WC  . sucralfate  1 g Oral TID WC & HS  . triamcinolone 0.1 % cream : eucerin   Topical BID   acetaminophen, bisacodyl, calcium carbonate, chlorpheniramine-HYDROcodone, diphenhydrAMINE, loperamide, polyethylene glycol, prochlorperazine **OR** prochlorperazine **OR** prochlorperazine, Resource ThickenUp Clear, traZODone  Assessment/ Plan:  1. Acute kidney Injury: With baseline creatinine of 1.2 and acute kidney injury likely from ATN associated with Covid infection/acute pancreatitis and shock. Unfortunately, remains anuric/with minimal urine output recordedandto continuehemodialysis on a TTS schedule while here (last dialyzed 2/5).He is suspected to have evolved into end-stage renal disease with plan to pursue permanent access as an outpatient after he  gets physically stronger. Status post HD on 2/12 with 2 L UF, tolerated well.  No sign of renal recovery so far.   Plan for next HD on 2/15.  2.Acute pancreatitis:This was noted on CT scan earlier (1/3 and 1/7). 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  3. Acute hypoxic respiratory failure:Secondary to Covid pneumonia and possible aspiration pneumonitis. Status post corticosteroids and supportive management-now on room air and without supplemental oxygen requirements.  4. Anemia: Secondary to acute/critical illness and GI losses.Continue current dose of ESA.  IV iron prohibitive because of very high ferritin level.  Received PRBC.  5 Secondary hyperparathyroidism:Phosphorus level improved after starting sevelamer.  PTH level 217 at goal.  6 Hypotension: On midodrine.  Blood pressure acceptable.   LOS: [place] @TODAY @7 :01 AM

## 2020-11-28 NOTE — Progress Notes (Signed)
Physical Therapy Weekly Progress Note  Patient Details  Name: Raymond Hurley. MRN: [id number] Date of Birth: May 15, 1931  Beginning of progress report period: November 11, 2020 End of progress report period: November 28, 2020  Today's Date: 11/28/2020 PT Individual Time: 0900-1009 PT Individual Time Calculation (min): 69 min   Patient has met 2 of 3 short term goals. Pt demonstrates slow progress towards long term goals. Pt currently requires supervision for semi-reclined<>sitting EOB, CGA/min A for transfers with RW, CGA to ambulate 1f with RW, and supervision for WC mobility up to 1535f Pt continues to be limited by fatigue, SOB, dizziness, generalized weakness, and poor endurance to activity.   Patient continues to demonstrate the following deficits muscle weakness, decreased cardiorespiratoy endurance and decreased standing balance, decreased postural control and decreased balance strategies and therefore will continue to benefit from skilled PT intervention to increase functional independence with mobility.  Patient progressing toward long term goals..  Continue plan of care.  PT Short Term Goals Week 2:  PT Short Term Goal 1 (Week 2): Pt will perform stand<>pivot transfer with LRAD and CGA PT Short Term Goal 1 - Progress (Week 2): Met PT Short Term Goal 2 (Week 2): Pt will ambulate 3072fith LRAD and min A PT Short Term Goal 2 - Progress (Week 2): Met PT Short Term Goal 3 (Week 2): Pt will perform bed mobility with CGA consistantly PT Short Term Goal 3 - Progress (Week 2): Progressing toward goal Week 3:  PT Short Term Goal 1 (Week 3): STG=LTG due to LOS  Skilled Therapeutic Interventions/Progress Updates:  Ambulation/gait training;Balance/vestibular training;Community reintegration;Cognitive remediation/compensation;Discharge planning;Disease management/prevention;DME/adaptive equipment instruction;Functional mobility training;Neuromuscular re-education;Patient/family  education;Pain management;Psychosocial support;Skin care/wound management;Splinting/orthotics;Stair training;Therapeutic Exercise;UE/LE Coordination activities;Visual/perceptual remediation/compensation;Wheelchair propulsion/positioning;UE/LE Strength taining/ROM;Therapeutic Activities   Today's Interventions: Received pt sitting on commode with NT and RN, PT assisted RN with care to reapply sacral dressing. Donned clean brief sitting with max A and pt transferred sit<>stand with min A and required total A to pull pants over hips while RN re-applied dressing. Stand<>pivot commode<>WC with RW and CGA/min A. Pt agreeable to therapy, and denied any pain during session. Session with emphasis on functional mobility/transfers, toileting, generalized strengthening, dynamic standing balance/coordination, ambulation, and improved activity tolerance. Pt sat in WC [facility] brushed teeth, combed hair, applied lotion, and washed face with set up assist and increased time. Pt performed WC mobility 150f14fing BUE and supervision to dayroom and ambulated 25ft63f and 30ft 84fwith RW and CGA. Pt demonstrated decreased cadence, narrow BOS, decreased stride length, and decreased trunk rotation. Pt required multiple extended rest breaks throughout session due to fatigue. Pt transferred sit<>stand with RW and min A x 4 trials and performed the following exercises standing with BUE support and CGA for balance: -alternating marches x15 and x16 -mini squats x7 and x5 Therapist took measurements to fit pt for 18x18 manual WC and readjusted Roho cushion. Discussed equipment for discharge and pt reported he has RW and WC that were his mother [place] law's [name]. Pt also with questions regarding using rollator. Educated pt on safety concerns using rollator and tendency for it to get away from him ultimately recommending RW at D/C; pt in agreement. Pt then performed the following exercises sitting in WC: -WC pushups x15 -hip  flexion x10 bilaterally  -LAQ x10 bilaterally  [facility] Concluded session with pt sitting in WC, needs within reach, and seatbelt alarm on.  Therapy Documentation Precautions:  Precautions Precautions: Fall,Other (comment) Precaution Comments: Bowel incontinence (apparently had flexiseal removed yesterday) Restrictions Weight Bearing Restrictions: (P) No  Therapy/Group: Individual Therapy Alfonse Alpers PT, DPT  11/28/2020, 7:19 AM

## 2020-11-28 NOTE — Consult Note (Addendum)
[facility] Nurse Consult Note: Patient admitted to [facility] 11/10/20. Patient receiving care in [facility] [phone]. Reason for Consult: unstageable on sacrum Wound type: Unstageable PI to right buttock Pressure Injury POA: Yes Measurement: 3.8 cm x 2.5 cm x unknown depth Wound bed: 100% yellow Drainage (amount, consistency, odor)  none Periwound: erythematous Dressing procedure/placement/frequency:  Apply Santyl to right buttock wound in a nickel thick layer. Cover with a saline moistened gauze, then dry gauze or ABD pad, or foam dressing.  Change daily. PT to perform after hydrotherapy. Primary RN to do all other days. I have consulted PT for hydrotherapy to wound.  The patient is already on a low air loss mattress. Thank you for the consult.  Discussed plan of care with the patient and bedside nurse.  [facility] nurse will not follow at this time.  Please re-consult the [facility] team if needed.  Val Riles, RN, MSN, CWOCN, CNS-BC, pager [phone]

## 2020-11-28 NOTE — Progress Notes (Signed)
Occupational Therapy Session Note  Patient Details  Name: Raymond [facility]. MRN: [id number] Date of Birth: 19-Mar-1931  Today's Date: 11/28/2020 OT Individual Time: 2197-5883 OT Individual Time Calculation (min): 63 min    Short Term Goals: Week 3:  OT Short Term Goal 1 (Week 3): Pt will transfer to toilet with MIN A consistently OT Short Term Goal 2 (Week 3): Pt will stand to complete 1 grooming task to demo improved activity tolerance OT Short Term Goal 3 (Week 3): Pt will complete 2/3 toileting steps consistently OT Short Term Goal 4 (Week 3): Pt will don socks/shoes with min assist with AE PRN  Skilled Therapeutic Interventions/Progress Updates:    Treatment session with focus on self-care retraining, functional transfers, and dynamic standing balance.  Pt received upright in bed eating breakfast.  Engaged in discussion regarding schedule and HD this date while pt completed breakfast.  Pt agreeable to grooming tasks at toileting this AM. Pt completed bed mobility with CGA and use of hospital bed rail and HOB elevated. Pt completed sit > stand min assist from elevated EOB with RW, therapist providing cues for hand placement. Pt completed stand pivot transfer with CGA with RW once upright.  Pt propelled w/c to sink with increased time.  Pt beginning to complete grooming tasks when reports urge to toilet. Completed sit > stand from w/c with min assist and RW.  Stand pivot to [facility] over toilet with RW with CGA.  Pt able to doff pants prior to sitting on toilet.  Pt required increased time on toilet.  Pt remained on toilet with nurse tech present for supervision due to time constraints.  Pt reports dizziness initially upon sitting up to EOB, passed in 1-2 mins. Pt then reports dizziness after transfer to toilet and standing ~20 seconds to doff pants.    Therapy Documentation Precautions:  Precautions Precautions: Fall,Other (comment) Precaution Comments: Bowel incontinence (apparently had  flexiseal removed yesterday) Restrictions Weight Bearing Restrictions: (P) No General:   Vital Signs: Therapy Vitals Temp: 98.1 F (36.7 C) Temp Source: Oral Pulse Rate: 76 Resp: 16 BP: 100/60 Patient Position (if appropriate): Lying Oxygen Therapy SpO2: 98 % O2 Device: Room Air Pain:  Pt with no c/o pain   Therapy/Group: Individual Therapy  [name] 11/28/2020, 8:26 AM

## 2020-11-28 NOTE — Progress Notes (Signed)
We were asked to weigh in on Amio PO dose in pts with ESRD on HD. Pt was on 200 mg PO /d at home in January before he went on HD. I reviewed with Pharm D who assured be that there was no renal dosing for PO amio and Pt could be DCd home on same dose. Please call for further questions.  thx  Lorretta Harp, M.D., FACP, [facility], FAHA, [place] [address] [place], [place]  [address]  [phone] 11/28/2020 5:48 PM

## 2020-11-29 LAB — GLUCOSE, CAPILLARY
Glucose-Capillary: 89 mg/dL (ref 70–99)
Glucose-Capillary: 112 mg/dL — ABNORMAL HIGH (ref 70–99)
Glucose-Capillary: 95 mg/dL (ref 70–99)
Glucose-Capillary: 107 mg/dL — ABNORMAL HIGH (ref 70–99)

## 2020-11-29 MED ORDER — CHLORHEXIDINE GLUCONATE CLOTH 2 % EX PADS
6.0000 | MEDICATED_PAD | Freq: Every day | CUTANEOUS | Status: DC
  Administered 2020-11-30 – 2020-12-05 (×6): 6 via TOPICAL

## 2020-11-29 NOTE — Patient Care Conference (Signed)
Inpatient RehabilitationTeam Conference and Plan of Care Update Date: 11/29/2020   Time: 12:05 PM    Patient Name: Raymond [facility].      Medical Record Number: [id number]  Date of Birth: 08-19-1931 Sex: Male         Room/Bed: 4W25C/4W25C-01 Payor Info: Payor: MEDICARE / Plan: MEDICARE PART A AND B / Product Type: *No Product type* /    Admit Date/Time:  11/10/2020  3:28 PM  Primary Diagnosis:  [facility] Problems: Principal Problem:   Debility Active Problems:   Steroid-induced hyperglycemia   Leucocytosis   Acute on chronic anemia   ESRD on dialysis [facility])   Palliative care by specialist   Acute pancreatitis   Dizziness and giddiness    Expected Discharge Date: Expected Discharge Date: 12/07/20  Team Members Present: Physician leading conference: Dr. Delice Lesch Care Coodinator Present: Dorien Chihuahua, RN, BSN, CRRN;Becky Dupree, LCSW Nurse Present: Other (comment) Levie [facility], RN) PT Present: Becky Sax, PT OT Present: [name], OT PPS Coordinator present : Gunnar Fusi, SLP     Current Status/Progress Goal Weekly Team Focus  Bowel/Bladder   Continent of bowel and bladder; LBM 11/26/20  To remain continent of bowel and bladder  Assess bowel and bladder needs q shift and prn   Swallow/Nutrition/ Hydration             ADL's   min-mod sit > stand with RW (min primarily, mod from [facility]), Min assist - CGA stand pivot transfers with RW, mod assist toileting, min assist LB dressing with AE (needs assist with TEDS), Supervision/setup UB bathing, dressing, and grooming tasks  Supervision overall  ADL retraining, sit > stand, stand pivot transfers, dynamic standing balance, AE education, activity tolerance/endurance   Mobility   bed mobility min A/CGA, transfers with RW CGA/min A, gait 64ft with RW min A, WC mobility 190ft supervision.  supervision assist transfers and bed mobility, CGA gait, min A steps  functional mobility/transfers, generalized strengthening,  dynamic standing balance/coordination, ambulation, and endurance   Communication             Safety/Cognition/ Behavioral Observations            Pain   Patient denies pain  Pt rates pain < 3/10  Assess pain q shift and prn   Skin   Unstageable to right buttock  Prevent further skin breakdown and remain free of infection  Assess skin q shift and prn     Discharge Planning:  Wife wants original discharge date 2/24 and reports will not place in a NH. Wife very argumentative and wants him to stay as long as possible   Team Discussion: Unstageable to right buttock now with santyl dressing and hydro therapy. Continue HD. Little progress noted within the past week.  Patient on target to meet rehab goals: yes, currently CGA- for stand pivot transfers, min - mod assist for toileting and goals set for supervision overall with CGA for gait and min assist for steps  *See Care Plan and progress notes for long and short-term goals.   Revisions to Treatment Plan:   Teaching Needs: Transfers, toileting, steps, medications, wound/skin care, etc  Current Barriers to Discharge: Decreased caregiver support, Home enviroment access/layout, Wound care and Hemodialysis  Possible Resolutions to Barriers: Family education with the wife Recommended w/c or transport chair with 3N1 and tub bench for discharge     Medical Summary Current Status: Generalized weakness affecting ability to stand or complete ADL tasks secondary to debility.  Barriers to Discharge: Medical stability;Hemodialysis;Decreased family/caregiver support;Weight;Wound care   Possible Resolutions to Barriers/Weekly Focus: Therapies, wean steroids, follow labs - Hb, Plts, WBCs, follow weights   Continued Need for Acute Rehabilitation Level of Care: The patient requires daily medical management by a physician with specialized training in physical medicine and rehabilitation for the following reasons: Direction of a multidisciplinary  physical rehabilitation program to maximize functional independence : Yes Medical management of patient stability for increased activity during participation in an intensive rehabilitation regime.: Yes Analysis of laboratory values and/or radiology reports with any subsequent need for medication adjustment and/or medical intervention. : Yes   I attest that I was present, lead the team conference, and concur with the assessment and plan of the team.   Dorien Chihuahua B 11/29/2020, 1:20 PM

## 2020-11-29 NOTE — Progress Notes (Addendum)
Patient ID: Raymond [facility]., male   DOB: May 05, 1931, 87 y.o.   MRN: [id number]  [place] with wife via telephone to inform of team conference progress this week and discharge date 2/24. She feels he needs to stay here longer and is not satisfied with this date. She would like his sacral wound healed before he is discharged. Discussed he came to rehab with this and can get [facility] to make sure is healing at home. Wanted to talk with MD and have text MD to let him know to call wife. Have scheduled family education for Monday at 1:00-3:00 with wife so will have time if needs to return prior to pt's discharge home. Discussed option of SNF and she declined this. She doesn't want him to go to a SNF and wants him home. She is aware of the option to hire private duty assist also. Will see how education goes on Monday and continue to work on discharge needs.  3:51 PM Spoke with wife again and she feels he is being kicked out. She wants to know who is setting up OP HD and how will he get there. She plans to set up private duty 24/7 CNA via [facility] she is aware she will need to set up this. Prefers [facility] will set up home health. Clarified with her what home health will do and what private duty will provide.

## 2020-11-29 NOTE — Progress Notes (Signed)
Physical Therapy Wound Treatment Patient Details  Name: Raymond [facility]. MRN: [id number] Date of Birth: 12/23/1930  Today's Date: 11/29/2020 PT Individual Time: 1122-1155 PT Individual Time Calculation (min): 33 min   Subjective  Subjective: "I'm glad someone is doing something about the wound." Patient and Family Stated Goals: heal wound Date of Onset:  (unknown) Prior Treatments: dressing change  Pain Score:  6/10  Wound Assessment  Pressure Injury 11/10/20 Buttocks Right;Mid Unstageable - Full thickness tissue loss in which the base of the injury is covered by slough (yellow, tan, gray, green or brown) and/or eschar (tan, brown or black) in the wound bed. red open area of partial s (Active)  Wound Image   11/29/20 1255  Dressing Type Barrier Film (skin prep);Gauze (Comment);Moist to dry;Foam - Lift dressing to assess site every shift 11/29/20 1255  Dressing Changed;Clean;Dry;Intact 11/29/20 1255  Dressing Change Frequency Daily 11/29/20 1255  State of Healing Early/partial granulation 11/29/20 1255  Site / Wound Assessment Yellow;Pale;Pink 11/29/20 1255  % Wound base Red or Granulating 0% 11/29/20 1255  % Wound base Yellow/Fibrinous Exudate 100% 11/29/20 1255  % Wound base Black/Eschar 0% 11/29/20 1255  % Wound base Other/Granulation Tissue (Comment) 0% 11/29/20 1255  Peri-wound Assessment Erythema (blanchable);Intact 11/29/20 1255  Wound Length (cm) 3.8 cm 11/29/20 1255  Wound Width (cm) 2.5 cm 11/29/20 1255  Wound Depth (cm) 0.5 cm 11/29/20 1255  Wound Surface Area (cm^2) 9.5 cm^2 11/29/20 1255  Wound Volume (cm^3) 4.75 cm^3 11/29/20 1255  Margins Unattached edges (unapproximated) 11/28/20 2140  Drainage Amount Scant 11/29/20 1255  Drainage Description Green 11/29/20 1255  Treatment Debridement (Selective);Hydrotherapy (Pulse lavage);Packing (Saline gauze) 11/29/20 1255  Santyl applied to wound bed prior to applying dressing.  Hydrotherapy Pulsed lavage therapy -  wound location: superior gluteal cleft Pulsed Lavage with Suction (psi): 12 psi Pulsed Lavage with Suction - Normal Saline Used: 1000 mL Pulsed Lavage Tip: Tip with splash shield Selective Debridement Selective Debridement - Location: superior gluteal cleft Selective Debridement - Tools Used: Forceps;Scissors Selective Debridement - Tissue Removed: yellow slought   Wound Assessment and Plan  Wound Therapy - Assess/Plan/Recommendations Wound Therapy - Clinical Statement: Pt presents to hydrotherapy with pressure injury to buttocks. Top layer of yellow slough removed fairly easily today; suspect wound will progress well. Discussed with pt regarding pressure relief and diet. Pt will benefit from hydrotherapy to cleanse wound and provide selective debridement. Wound Therapy - Functional Problem List: decreased mobility Factors Delaying/Impairing Wound Healing: Immobility Hydrotherapy Plan: Debridement;Patient/family education;Pulsatile lavage with suction Wound Therapy - Frequency: 6X / week Wound Therapy - Follow Up Recommendations: Home health RN Wound Plan: see above  Wound Therapy Goals- Improve the function of patient's integumentary system by progressing the wound(s) through the phases of wound healing (inflammation - proliferation - remodeling) by: Decrease Necrotic Tissue to: 10 Decrease Necrotic Tissue - Progress: Progressing toward goal Increase Granulation Tissue to: 90 Increase Granulation Tissue - Progress: Goal set today Goals/treatment plan/discharge plan were made with and agreed upon by patient/family: Yes Time For Goal Achievement: 7 days Wound Therapy - Potential for Goals: Good  Goals will be updated until maximal potential achieved or discharge criteria met.  Discharge criteria: when goals achieved, discharge from hospital, MD decision/surgical intervention, no progress towards goals, refusal/missing three consecutive treatments without notification or medical  reason.  GP  Wyona Almas, PT, DPT Acute Rehabilitation Services Pager [phone] Office [phone]      KANE KUSEK 11/29/2020, 1:16 PM

## 2020-11-29 NOTE — Progress Notes (Signed)
[facility] PHYSICAL MEDICINE & REHABILITATION PROGRESS NOTE   Subjective/Complaints: Patient seen sitting up in bed this morning.  He states he slept well overnight.  Discussed offloading with patient again today. He was seen by Nephro yesterday, notes reviewed - plan for cath exchange.  He was seen by Cards yesterday, notes reviewed, plan to return to home does of Amio.   ROS: Denies CP, SOB, N/V/D  Objective:   No results found. Recent Labs    11/28/20 1601  WBC 10.3  HGB 7.0*  HCT 22.9*  PLT 210   Recent Labs    11/28/20 1601  NA 131*  K 3.7  CL 94*  CO2 20*  GLUCOSE 125*  BUN 71*  CREATININE 8.45*  CALCIUM 8.3*    Intake/Output Summary (Last 24 hours) at 11/29/2020 1228 Last data filed at 11/29/2020 0700 Gross per 24 hour  Intake 420 ml  Output 2192 ml  Net -1772 ml     Pressure Injury 11/10/20 Buttocks Right;Mid Unstageable - Full thickness tissue loss in which the base of the injury is covered by slough (yellow, tan, gray, green or brown) and/or eschar (tan, brown or black) in the wound bed. red open area of partial s (Active)  11/10/20 1721  Location: Buttocks  Location Orientation: Right;Mid  Staging: Unstageable - Full thickness tissue loss in which the base of the injury is covered by slough (yellow, tan, gray, green or brown) and/or eschar (tan, brown or black) in the wound bed.  Wound Description (Comments): red open area of partial skin loss with eschar covering wound bed  Present on Admission: Yes    Physical Exam: Vital Signs Blood pressure (!) 102/58, pulse 85, temperature 98 F (36.7 C), temperature source Oral, resp. rate 16, height 5\' 10"  (1.778 m), weight 78 kg, SpO2 98 %.  Constitutional: No distress . Vital signs reviewed. HENT: Normocephalic.  Atraumatic. Eyes: EOMI. No discharge. Cardiovascular: No JVD.  RRR. Respiratory: Normal effort.  No stridor.  Bilateral clear to auscultation. GI: Non-distended.  BS +. Skin: Warm and dry.    Coccyx ulcer with decrease in width, but increased in depth-unstageable. Psych: Normal mood.  Normal behavior. Musc: No edema in extremities.  No tenderness in extremities. Neuro: Alert Motor: Bilateral upper extremities: 4+/5 throughout, stable Left lower extremity: Hip flexion, knee extension 4-/5, ankle dorsiflexion 4+/5, unchanged Right lower extremity: Hip flexion, knee extension 3 +-4-/5, ankle dorsiflexion 4+/5  Assessment/Plan: 1. Functional deficits which require 3+ hours per day of interdisciplinary therapy in a comprehensive inpatient rehab setting.  Physiatrist is providing close team supervision and 24 hour management of active medical problems listed below.  Physiatrist and rehab team continue to assess barriers to discharge/monitor patient progress toward functional and medical goals  Care Tool:  Bathing    Body parts bathed by patient: Right arm,Left arm,Chest,Abdomen,Face,Front perineal area   Body parts bathed by helper: Buttocks     Bathing assist Assist Level: Minimal Assistance - Patient > 75%     Upper Body Dressing/Undressing Upper body dressing   What is the patient wearing?: Pull over shirt,Button up shirt    Upper body assist Assist Level: Supervision/Verbal cueing    Lower Body Dressing/Undressing Lower body dressing      What is the patient wearing?: Underwear/pull up,Pants     Lower body assist Assist for lower body dressing: Minimal Assistance - Patient > 75%     Toileting Toileting    Toileting assist Assist for toileting: Maximal Assistance - Patient 25 -  49%     Transfers Chair/bed transfer  Transfers assist     Chair/bed transfer assist level: Minimal Assistance - Patient > 75% Chair/bed transfer assistive device: Programmer, multimedia   Ambulation assist   Ambulation activity did not occur: Safety/medical concerns  Assist level: Contact Guard/Touching assist Assistive device: Walker-rolling Max distance:  68ft   Walk 10 feet activity   Assist  Walk 10 feet activity did not occur: Safety/medical concerns  Assist level: Contact Guard/Touching assist Assistive device: Walker-rolling   Walk 50 feet activity   Assist Walk 50 feet with 2 turns activity did not occur: Safety/medical concerns         Walk 150 feet activity   Assist Walk 150 feet activity did not occur: Safety/medical concerns         Walk 10 feet on uneven surface  activity   Assist Walk 10 feet on uneven surfaces activity did not occur: Safety/medical concerns         Wheelchair     Assist Will patient use wheelchair at discharge?: Yes Type of Wheelchair: Manual    Wheelchair assist level: Supervision/Verbal cueing Max wheelchair distance: 151ft    Wheelchair 50 feet with 2 turns activity    Assist        Assist Level: Supervision/Verbal cueing   Wheelchair 150 feet activity     Assist      Assist Level: Supervision/Verbal cueing     Medical Problem List and Plan: 1.  Generalized weakness affecting ability to stand or complete ADL tasks secondary to debility.  Continue CIR   Previously discussed with wife plans for discharge and goals of care  Team conference today to discuss current and goals and coordination of care, home and environmental barriers, and discharge planning with nursing, case manager, and therapies. Please see conference note from today as well.   Discussed with wife, who is upset, and believes that patient needs to stay longer and be more functional upon discharge.  Attempted to educate wife again. 2.  Antithrombotics: -DVT/anticoagulation:  Mechanical: Sequential compression devices, below knee Bilateral lower extremities due to GI bleed.             -antiplatelet therapy: N/A 3. Pain Management: Tylenol prn.   Controlled with meds on 2/16 4. Mood: LCSW to follow for evaluation and support.              -antipsychotic agents: N/a 5. Neuropsych: This  patient is capable of making decisions on his own behalf. 6. Skin/Wound Care: Routine pressure relief measures.   Discussed bedding and pressure relief for healing of sacral ulcer, discussed again, improving 7. Fluids/Electrolytes/Nutrition: Strict I/Os.  Labs with HD.discussed 1264ml fluid restriction  8.  ESRD: On midodrine prior to HD to help support BP.   Permanent access as outpatient  Plan for cath exchange for nephro 9. Chronic systolic CHF/CAF: Strict I/Os. Fluid status managed with HD.              Daily weights  Lasix per Nephro, appreciate Recs  Amio per Cards recs  Filed Weights   11/28/20 0438 11/28/20 1818 11/29/20 0500  Weight: 81.6 kg 79.5 kg 78 kg   Stable on 2/16 10.  Pancreatitis: Has completed antibiotic course. Tolerating diet without nausea, diarrhea or abdominal pain.              Monitor closely.  Tolerating diet at present 11. LGIB: On Carafate ac/hs.  12. Acute on chronic anemia: On aranresp  weekly.              Labs with HD, transfused on 2/5  Hemoglobin 7.0 on 2/15 13. Thrombocytopenia: Monitor for signs of bleeding.              Labs with HD  Resolved 14. Acute respiratory failure: Has resolved.   See #16 15.  Leukocytosis-some steroid effect  WBCs 10.3 on 2/15  Afebrile 16.  Steroid-induced hyperglycemia  SSI  Prednisone decreased to 20 on 2/4, decreased to 10 on 2/9, decreased to 5 on 2/15  Improving on 2/15   17.  Dizziness  Likely due to anemia and soft blood pressures and limited p.o. intake in accordance with HD, patient not drinking nearly up to 1200cc fluid restriction  Trying to drink more, discussed again  Continue midodrine 18. Slow transit constipation:  Improving  LOS: 19 days A FACE TO FACE EVALUATION WAS PERFORMED  Legna Mausolf Lorie Phenix 11/29/2020, 12:28 PM

## 2020-11-29 NOTE — Progress Notes (Signed)
Renal Navigator confirmed with CIR CSW/B. Dupree that patient still has a discharge date of 12/07/20. Navigator re-referred patient for outpatient HD, now for ESRD per Nephrology.  All updated information has been submitted to [facility] Admissions and Navigator will follow closely. Per CSW, patient's wife is asking about how patient will get to/from outpatient HD. Navigator notes that patient should qualify for [facility] [place] and that CSW can submit this application to the Eligibility Coordinator/C. Rorie, who can typically get a new rider pre-certified within 24 hours (especially for HD patients).  Navigator will follow closely and follow up with patient's wife once patient's seat has been secured.  Alphonzo Cruise, [place] Renal Navigator [phone]

## 2020-11-29 NOTE — Progress Notes (Signed)
[facility] ROUNDING NOTE   Subjective:   Interval History: This is a 87 year old gentleman who remained anuric oliguric following Covid infection.  He is now CIR requiring intensive physical therapy.  His baseline creatinine is 1.2.  However he has been dialysis dependent since admission and is now on a Tuesday Thursday Saturday dialysis schedule.  There is no recorded urine output.  His last dialysis 11/28/2020 with 2 L removed  Blood pressure 102/58 pulse 85 temperature 98 O2 sats 98% room  Sodium 131 potassium 3.7 chloride 95 CO2 20 BUN 71 creatinine 8.45 glucose 125 calcium 8.3 phosphorus 2.8 albumin 2.5 hemoglobin 7.0  Objective:  Vital signs in last 24 hours:  Temp:  [98 F (36.7 C)-98.7 F (37.1 C)] 98 F (36.7 C) (02/16 0525) Pulse Rate:  [79-90] 85 (02/16 0525) Resp:  [16-18] 16 (02/16 0525) BP: (94-112)/(54-75) 102/58 (02/16 0533) SpO2:  [97 %-100 %] 98 % (02/16 0525) Weight:  [78 kg-79.5 kg] 78 kg (02/16 0500)  Weight change: -2.147 kg Filed Weights   11/28/20 0438 11/28/20 1818 11/29/20 0500  Weight: 81.6 kg 79.5 kg 78 kg    Intake/Output: I/O last 3 completed shifts: In: 420 [P.O.:420] Out: 2192 [Other:2192]   Intake/Output this shift:  No intake/output data recorded.  General: Lying on bed comfortable, not in distress Heart:RRR, s1s2 nl Lungs: Clear b/l, no crackle Abdomen:soft, Non-tender, non-distended Extremities: Trace ankle edema, stable Dialysis Access: Right IJ TDC placed by IR on 1/24.  Site clean   Basic Metabolic Panel: Recent Labs  Lab 11/23/20 1606 11/25/20 1242 11/28/20 1601  NA 132* 134* 131*  K 3.7 3.7 3.7  CL 93* 96* 94*  CO2 21* 20* 20*  GLUCOSE 119* 127* 125*  BUN 73* 49* 71*  CREATININE 7.19* 6.53* 8.45*  CALCIUM 8.6* 8.5* 8.3*  PHOS 4.9* 3.2 2.8    Liver Function Tests: Recent Labs  Lab 11/23/20 1606 11/25/20 1242 11/28/20 1601  ALBUMIN 2.7* 2.8* 2.5*   No results for input(s): LIPASE, AMYLASE in  the last 168 hours. No results for input(s): AMMONIA in the last 168 hours.  CBC: Recent Labs  Lab 11/23/20 1606 11/25/20 1242 11/28/20 1601  WBC 12.1* 11.1* 10.3  HGB 7.0* 7.6* 7.0*  HCT 21.1* 25.2* 22.9*  MCV 94.6 99.6 100.0  PLT 196 268 210    Cardiac Enzymes: No results for input(s): CKTOTAL, CKMB, CKMBINDEX, TROPONINI in the last 168 hours.  BNP: Invalid input(s): POCBNP  CBG: Recent Labs  Lab 11/28/20 0607 11/28/20 1219 11/28/20 1850 11/28/20 2132 11/29/20 0610  GLUCAP 82 97 106* 116* 89    Microbiology: Results for orders placed or performed during the hospital encounter of 10/14/20  Resp Panel by RT-PCR (Flu A&B, Covid) Nasopharyngeal Swab     Status: Abnormal   Collection Time: 10/14/20  2:21 AM   Specimen: Nasopharyngeal Swab; Nasopharyngeal(NP) swabs in vial transport medium  Result Value Ref Range Status   SARS Coronavirus 2 by RT PCR POSITIVE (A) NEGATIVE Final    Comment: RESULT CALLED TO, READ BACK BY AND VERIFIED WITH: G CERER RN 10/14/20 0349 JDW (NOTE) SARS-CoV-2 target nucleic acids are DETECTED.  The SARS-CoV-2 RNA is generally detectable in upper respiratory specimens during the acute phase of infection. Positive results are indicative of the presence of the identified virus, but do not rule out bacterial infection or co-infection with other pathogens not detected by the test. Clinical correlation with patient history and other diagnostic information is necessary to determine patient infection status.  The expected result is Negative.  Fact Sheet for Patients: EntrepreneurPulse.com.au  Fact Sheet for Healthcare Providers: IncredibleEmployment.be  This test is not yet approved or cleared by the [place] FDA and  has been authorized for detection and/or diagnosis of SARS-CoV-2 by FDA under an Emergency Use Authorization (EUA).  This EUA will remain in effect (meaning this test can be used)  for the  duration of  the COVID-19 declaration under Section 564(b)(1) of the Act, 21 U.S.C. section 360bbb-3(b)(1), unless the authorization is terminated or revoked sooner.     Influenza A by PCR NEGATIVE NEGATIVE Final   Influenza B by PCR NEGATIVE NEGATIVE Final    Comment: (NOTE) The Xpert Xpress SARS-CoV-2/FLU/RSV plus assay is intended as an aid in the diagnosis of influenza from Nasopharyngeal swab specimens and should not be used as a sole basis for treatment. Nasal washings and aspirates are unacceptable for Xpert Xpress SARS-CoV-2/FLU/RSV testing.  Fact Sheet for Patients: EntrepreneurPulse.com.au  Fact Sheet for Healthcare Providers: IncredibleEmployment.be  This test is not yet approved or cleared by the [place] FDA and has been authorized for detection and/or diagnosis of SARS-CoV-2 by FDA under an Emergency Use Authorization (EUA). This EUA will remain in effect (meaning this test can be used) for the duration of the COVID-19 declaration under Section 564(b)(1) of the Act, 21 U.S.C. section 360bbb-3(b)(1), unless the authorization is terminated or revoked.  Performed at [facility], [place] [address], [place], [place] [address]   Culture, blood (routine x 2)     Status: None   Collection Time: 10/14/20 10:36 AM   Specimen: BLOOD RIGHT HAND  Result Value Ref Range Status   Specimen Description BLOOD RIGHT HAND  Final   Special Requests   Final    BOTTLES DRAWN AEROBIC AND ANAEROBIC Blood Culture adequate volume   Culture   Final    NO GROWTH 5 DAYS Performed at [facility], [place] [address], [place], [place] [address]    Report Status 10/19/2020 FINAL  Final  Culture, blood (routine x 2)     Status: None   Collection Time: 10/14/20 10:36 AM   Specimen: BLOOD LEFT HAND  Result Value Ref Range Status   Specimen Description BLOOD LEFT HAND  Final   Special Requests   Final    BOTTLES DRAWN AEROBIC AND ANAEROBIC  Blood Culture results may not be optimal due to an inadequate volume of blood received in culture bottles   Culture   Final    NO GROWTH 5 DAYS Performed at [facility], [place] [address] [place], [place] [address]    Report Status 10/19/2020 FINAL  Final  Gastrointestinal Panel by PCR , Stool     Status: None   Collection Time: 10/14/20 11:00 AM   Specimen: Stool  Result Value Ref Range Status   Campylobacter species NOT DETECTED NOT DETECTED Final   Plesimonas shigelloides NOT DETECTED NOT DETECTED Final   Salmonella species NOT DETECTED NOT DETECTED Final   Yersinia enterocolitica NOT DETECTED NOT DETECTED Final   Vibrio species NOT DETECTED NOT DETECTED Final   Vibrio cholerae NOT DETECTED NOT DETECTED Final   Enteroaggregative E coli (EAEC) NOT DETECTED NOT DETECTED Final   Enteropathogenic E coli (EPEC) NOT DETECTED NOT DETECTED Final   Enterotoxigenic E coli (ETEC) NOT DETECTED NOT DETECTED Final   Shiga like toxin producing E coli (STEC) NOT DETECTED NOT DETECTED Final   Shigella/Enteroinvasive E coli (EIEC) NOT DETECTED NOT DETECTED Final  Cryptosporidium NOT DETECTED NOT DETECTED Final   Cyclospora cayetanensis NOT DETECTED NOT DETECTED Final   Entamoeba histolytica NOT DETECTED NOT DETECTED Final   Giardia lamblia NOT DETECTED NOT DETECTED Final   Adenovirus F40/41 NOT DETECTED NOT DETECTED Final   Astrovirus NOT DETECTED NOT DETECTED Final   Norovirus GI/GII NOT DETECTED NOT DETECTED Final   Rotavirus A NOT DETECTED NOT DETECTED Final   Sapovirus (I, II, IV, and V) NOT DETECTED NOT DETECTED Final    Comment: Performed at [facility], [place], [place] [address]  C Difficile Quick Screen w PCR reflex     Status: None   Collection Time: 10/14/20 11:00 AM   Specimen: STOOL  Result Value Ref Range Status   C Diff antigen NEGATIVE NEGATIVE Final   C Diff toxin NEGATIVE NEGATIVE Final   C Diff interpretation No C. difficile detected.  Final     Comment: Performed at [facility], [place] [address], [place], [place] [address]  MRSA PCR Screening     Status: None   Collection Time: 10/15/20 12:21 PM   Specimen: Nasal Mucosa; Nasopharyngeal  Result Value Ref Range Status   MRSA by PCR NEGATIVE NEGATIVE Final    Comment:        The GeneXpert MRSA Assay (FDA approved for NASAL specimens only), is one component of a comprehensive MRSA colonization surveillance program. It is not intended to diagnose MRSA infection nor to guide or monitor treatment for MRSA infections. Performed at [facility], [place] [address] [place], [place] [address]   Culture, blood (Routine X 2) w Reflex to ID Panel     Status: None   Collection Time: 10/20/20  4:40 PM   Specimen: BLOOD RIGHT HAND  Result Value Ref Range Status   Specimen Description BLOOD RIGHT HAND  Final   Special Requests   Final    BOTTLES DRAWN AEROBIC AND ANAEROBIC Blood Culture adequate volume   Culture   Final    NO GROWTH 5 DAYS Performed at [facility], [place] [address], [place], [place] [address]    Report Status 10/25/2020 FINAL  Final  Culture, blood (Routine X 2) w Reflex to ID Panel     Status: None   Collection Time: 10/20/20  4:43 PM   Specimen: BLOOD  Result Value Ref Range Status   Specimen Description BLOOD LEFT ANTECUBITAL  Final   Special Requests   Final    BOTTLES DRAWN AEROBIC ONLY Blood Culture results may not be optimal due to an inadequate volume of blood received in culture bottles   Culture   Final    NO GROWTH 5 DAYS Performed at [facility], [place] [address], [place], [place] [address]    Report Status 10/25/2020 FINAL  Final  MRSA PCR Screening     Status: None   Collection Time: 10/21/20 10:08 AM   Specimen: Nasal Mucosa; Nasopharyngeal  Result Value Ref Range Status   MRSA by PCR NEGATIVE NEGATIVE Final    Comment:        The GeneXpert MRSA Assay (FDA approved for NASAL specimens only), is one component of  a comprehensive MRSA colonization surveillance program. It is not intended to diagnose MRSA infection nor to guide or monitor treatment for MRSA infections. Performed at [facility], [place] [address] [place], [place] [address]     Coagulation Studies: No results for input(s): LABPROT, INR in the last 72 hours.  Urinalysis: No results for input(s): COLORURINE, LABSPEC, PHURINE, GLUCOSEU, HGBUR, BILIRUBINUR, KETONESUR, PROTEINUR, UROBILINOGEN, NITRITE, LEUKOCYTESUR in the last 72 hours.  Invalid input(s): APPERANCEUR    Imaging: No results found.   Medications:    . sodium chloride   Intravenous Once  . amiodarone  400 mg Oral BID  . B-complex with vitamin C  1 tablet Oral Daily  . Chlorhexidine Gluconate Cloth  6 each Topical Q0600  . collagenase   Topical Daily  . darbepoetin (ARANESP) injection - DIALYSIS  200 mcg Intravenous Q Tue-HD  . docusate  100 mg Oral BID  . feeding supplement (NEPRO CARB STEADY)  237 mL Oral TID WC  . Gerhardt's butt cream   Topical TID  . insulin aspart  3-9 Units Subcutaneous TID WC  . lactobacillus acidophilus  2 tablet Oral TID  . lidocaine  1 patch Transdermal Q24H  . mouth rinse  15 mL Mouth Rinse BID  . midodrine  10 mg Oral TID WC  . predniSONE  5 mg Oral Q breakfast  . sevelamer carbonate  0.8 g Oral TID WC  . sucralfate  1 g Oral TID WC & HS  . triamcinolone 0.1 % cream : eucerin   Topical BID   acetaminophen, bisacodyl, calcium carbonate, chlorpheniramine-HYDROcodone, diphenhydrAMINE, loperamide, polyethylene glycol, prochlorperazine **OR** prochlorperazine **OR** prochlorperazine, Resource ThickenUp Clear, traZODone  Assessment/ Plan:  1. Acute kidney Injury: With baseline creatinine of 1.2 and acute kidney injury likely from ATN associated with Covid infection/acute pancreatitis and shock. Unfortunately, remains anuric/with minimal urine output recordedandto continuehemodialysis on a TTS schedule while here (last  dialyzed 2/5).He is suspected to have evolved into end-stage renal disease with plan to pursue permanent access as an outpatient after he gets physically stronger. Status post HD on 11/28/2020 with 2 L removed next dialysis will be planned for 11/30/2020  2.Acute pancreatitis:This was noted on CT scan earlier (1/3 and 1/7). 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  3. Acute hypoxic respiratory failure:Secondary to Covid pneumonia and possible aspiration pneumonitis. Status post corticosteroids and supportive management-now on room air and without supplemental oxygen requirements.  4. Anemia: Secondary to acute/critical illness and GI losses.Continue current dose of ESA.  IV iron prohibitive because of very high ferritin level.  Received PRBC.  5 Secondary hyperparathyroidism:Phosphorus level improved after starting sevelamer.  PTH level 217 at goal.  6 Hypotension: On midodrine.  Blood pressure acceptable.   LOS: [place] @TODAY @8 :53 AM

## 2020-11-29 NOTE — Progress Notes (Signed)
Occupational Therapy Session Note  Patient Details  Name: Raymond Hurley. MRN: [id number] Date of Birth: [birth year]  Today's Date: 11/29/2020 OT Individual Time: 0722-5750 OT Individual Time Calculation (min): 57 min    Short Term Goals: Week 1:  OT Short Term Goal 1 (Week 1): Pt iwll transfer to toilet with MIN A consistently OT Short Term Goal 1 - Progress (Week 1): Progressing toward goal OT Short Term Goal 2 (Week 1): Pt will thread BLE into pants wiht AE PRN OT Short Term Goal 2 - Progress (Week 1): Met OT Short Term Goal 3 (Week 1): PT will complete 1/3 steps of toileting OT Short Term Goal 3 - Progress (Week 1): Met OT Short Term Goal 4 (Week 1): pt will stand to groom to demo improved activity tolernace OT Short Term Goal 4 - Progress (Week 1): Progressing toward goal  Skilled Therapeutic Interventions/Progress Updates:    1:1. Pt reporting low BP this morning and pain on buttocks. OT dons teds and larger griop socks total A. BP supine: 99/58, EOB 81/50 with symptoms subsiding after 1 min, BP in w/c after stand pivot transfer and 2x10 LAQ/marches 87/46, BP after returning to bed via stand pivot 109/61. Edu re OH and 20 point drops as well as adding ace wraps, longer teds and abdominal binder as an option to support BP. Pt verbalized unerstanding. 100/48 after sitting up again and brushing teeth. All transfers with MIN A sit to stand with RW for transfer to/from w/c. Grooming at EOB with set up.  Exited session with pt seated in bed, exit alarm on and call light in reach   Therapy Documentation Precautions:  Precautions Precautions: Fall,Other (comment) Precaution Comments: Bowel incontinence (apparently had flexiseal removed yesterday) Restrictions Weight Bearing Restrictions: No General:   Vital Signs: Therapy Vitals Temp: 98 F (36.7 C) Temp Source: Oral Pulse Rate: 85 Resp: 16 BP: (!) 102/58 Patient Position (if appropriate): Lying Oxygen Therapy SpO2: 98  % O2 Device: Room Air Pain:   ADL: ADL Eating: Set up Where Assessed-Eating: Bed level Grooming: Supervision/safety Where Assessed-Grooming: Edge of bed Lower Body Bathing: Moderate assistance Where Assessed-Lower Body Bathing: Edge of bed Upper Body Dressing: Minimal assistance Where Assessed-Upper Body Dressing: Edge of bed Lower Body Dressing: Maximal assistance Where Assessed-Lower Body Dressing: Edge of bed Vision   Perception    Praxis   Exercises:   Other Treatments:     Therapy/Group: Individual Therapy  [name] 11/29/2020, 6:49 AM

## 2020-11-29 NOTE — Progress Notes (Signed)
Physical Therapy Session Note  Patient Details  Name: Raymond Hurley. MRN: [id number] Date of Birth: 1931-04-09  Today's Date: 11/29/2020 PT Individual Time: 0922-1000 PT Individual Time Calculation (min): 38 min   Short Term Goals: Week 3:  PT Short Term Goal 1 (Week 3): STG=LTG due to LOS  Skilled Therapeutic Interventions/Progress Updates:  Pt received supine in bed and agreeable to PT at bed level; reporting the BP ws very low his AM, requiring staff to assist hip back to bed.  PT assessed VS in supine. 93/71, HR 82.   SAQ, 2 x 10 with AAROM on the L Quad sets. 2 x 12 glute ses. 2 x 10 with cues for isolation of gluteal musculature.  clam shells. With level 2 tband 2 x 10  Hip abduction, with AAROM on the RLE 2 x 10  Ankle DF 2 x 15 SLR 2 x 8 with AAROM on the RLE Heel slides 2 x 10 with assist to reduce friction from grip socks.  Cues for improved ROM throughout as well as decreased speed of eccentric aspect of movements.  Pt left in bed with call bell in reach and all needs met.       Therapy Documentation Precautions:  Precautions Precautions: Fall,Other (comment) Precaution Comments: Bowel incontinence (apparently had flexiseal removed yesterday) Restrictions Weight Bearing Restrictions: No Vital Signs: Therapy Vitals Temp: 97.7 F (36.5 C) Temp Source: Oral Pulse Rate: 79 Resp: 18 BP: (!) 105/55 Patient Position (if appropriate): Lying Oxygen Therapy SpO2: 98 % O2 Device: Room Air Pain:   denies at rest   Therapy/Group: Individual Therapy  Lorie Phenix 11/29/2020, 3:32 PM

## 2020-11-29 NOTE — Progress Notes (Signed)
Physical Therapy Session Note  Patient Details  Name: Raymond Hurley. MRN: [id number] Date of Birth: [birth year]  Today's Date: 11/29/2020 PT Individual Time: 1037-1105 PT Individual Time Calculation (min): 28 min   Short Term Goals: Week 3:  PT Short Term Goal 1 (Week 3): STG=LTG due to LOS  Skilled Therapeutic Interventions/Progress Updates:    Patient in supine reporting BP issues this morning, but noted was able to get up with OT with ace wraps and binder.  Patient in supine for therex to include heel slides, hip abduction, SAQ, bridging, hooklying hip abduction with orange t-band and crunches with head lift all x 10.  Patient requesting to toilet.  Supine to L side with rail and S, side to sit with min A and increased time with rail.  Sit to stand to [facility] with min A.  Assisted to toilet with [facility] dependent transfer.  Patient needing extra time so NT made aware and pt to call for assistance when ready.   Therapy Documentation Precautions:  Precautions Precautions: Fall,Other (comment) Precaution Comments: Bowel incontinence (apparently had flexiseal removed yesterday) Restrictions Weight Bearing Restrictions: No   Therapy/Group: Individual Therapy  Reginia Naas  Magda Kiel, PT 11/29/2020, 12:50 PM

## 2020-11-29 NOTE — Progress Notes (Signed)
Physical Therapy Session Note  Patient Details  Name: Raymond Hurley. MRN: [id number] Date of Birth: [birth year]  Today's Date: 11/29/2020 PT Individual Time: 1300-1409 PT Individual Time Calculation (min): 69 min   Short Term Goals: Week 2:  PT Short Term Goal 1 (Week 2): Pt will perform stand<>pivot transfer with LRAD and CGA PT Short Term Goal 1 - Progress (Week 2): Met PT Short Term Goal 2 (Week 2): Pt will ambulate 76f with LRAD and min A PT Short Term Goal 2 - Progress (Week 2): Met PT Short Term Goal 3 (Week 2): Pt will perform bed mobility with CGA consistantly PT Short Term Goal 3 - Progress (Week 2): Progressing toward goal Week 3:  PT Short Term Goal 1 (Week 3): STG=LTG due to LOS  Skilled Therapeutic Interventions/Progress Updates:   Received pt semi-reclined in bed reporting having low BP this morning but feeling better now, pt agreeable to therapy, and denied any pain during session. Therapist assisted pt with re-connecting suction as pt reported it stopped working ~1 hour ago. Session with emphasis on functional mobility/transfers, generalized strengthening, dynamic standing balance/coordination, ambulation, simulated car transfers, and improved activity tolerance. BP in supine 105/55 and pt asymptomatic. Donned bilateral non-skid socks with total A and pt requested to finish drinking his chicken broth from lunch prior to getting OOB. Pt transferred supine<>sitting EOB with HOB elevated and heavy use of bedrails with supervision and increased time. Pt transferred sit<>stand with RW and min A and stand<>pivot bed<>WC with RW and CGA. Pt required rest breaks upon transitioning due to dizziness. [facility] for time management and energy conservation purposes and performed simulated car transfer with RW and mod A with significantly increased time due to fatigue. Pt required assist to get BLEs into car but able to get LEs out without assist. [facility]  [facility] and [facility] sit<>stand with RW and min A and ambulated 151fwith RW and CGA prior to requesting to sit. Attempted to ambulate again however when initiating stand pt reported increased dizziness and politely declined trying again. [facility] and [facility]. Concluded session with pt sitting in WC, needs within reach, and seatbelt alarm on. Therapist provided fresh water to pt.   Therapy Documentation Precautions:  Precautions Precautions: Fall,Other (comment) Precaution Comments: Bowel incontinence (apparently had flexiseal removed yesterday) Restrictions Weight Bearing Restrictions: No  Therapy/Group: Individual Therapy AnAlfonse AlpersT, DPT   11/29/2020, 7:32 AM

## 2020-11-30 LAB — GLUCOSE, CAPILLARY
Glucose-Capillary: 78 mg/dL (ref 70–99)
Glucose-Capillary: 109 mg/dL — ABNORMAL HIGH (ref 70–99)
Glucose-Capillary: 96 mg/dL (ref 70–99)
Glucose-Capillary: 131 mg/dL — ABNORMAL HIGH (ref 70–99)

## 2020-11-30 MED ORDER — DAKINS (1/4 STRENGTH) 0.125 % EX SOLN
Freq: Two times a day (BID) | CUTANEOUS | Status: AC
  Filled 2020-11-30: qty 473

## 2020-11-30 MED ORDER — HEPARIN SODIUM (PORCINE) 1000 UNIT/ML IJ SOLN
INTRAMUSCULAR | Status: AC
  Filled 2020-11-30: qty 4

## 2020-11-30 NOTE — Progress Notes (Signed)
[facility] ROUNDING NOTE   Subjective:   Interval History: This is a 87 year old gentleman who remained anuric oliguric following Covid infection.  He is now CIR requiring intensive physical therapy.  His baseline creatinine is 1.2.  However he has been dialysis dependent since admission and is now on a Tuesday Thursday Saturday dialysis schedule.  There is no recorded urine output.  His last dialysis 11/28/2020 with 2 L removed. Next dialysis scheduled for 11/30/2020. She had assistance from [facility] sure renal navigator for outpatient dialysis placement  Blood pressure 107/89 pulse 74 temperature 97.7 O2 sats 94%  Sodium 131 potassium 3.7 chloride 95 CO2 20 BUN 71 creatinine 8.45 glucose 125 calcium 8.3 phosphorus 2.8 albumin 2.5 hemoglobin 7.0  Objective:  Vital signs in last 24 hours:  Temp:  [97.7 F (36.5 C)] 97.7 F (36.5 C) (02/17 0423) Pulse Rate:  [74-82] 74 (02/17 0423) Resp:  [18-20] 20 (02/17 0423) BP: (105-115)/(55-89) 107/89 (02/17 0423) SpO2:  [94 %-98 %] 94 % (02/17 0423) Weight:  [75.9 kg] 75.9 kg (02/17 0500)  Weight change: -3.6 kg Filed Weights   11/28/20 1818 11/29/20 0500 11/30/20 0500  Weight: 79.5 kg 78 kg 75.9 kg    Intake/Output: I/O last 3 completed shifts: In: 540 [P.O.:540] Out: -    Intake/Output this shift:  No intake/output data recorded.  General: Lying on bed comfortable, not in distress Heart:RRR, s1s2 nl Lungs: Clear b/l, no crackle Abdomen:soft, Non-tender, non-distended Extremities: Trace ankle edema, stable Dialysis Access: Right IJ TDC placed by IR on 1/24.  Site clean   Basic Metabolic Panel: Recent Labs  Lab 11/23/20 1606 11/25/20 1242 11/28/20 1601  NA 132* 134* 131*  K 3.7 3.7 3.7  CL 93* 96* 94*  CO2 21* 20* 20*  GLUCOSE 119* 127* 125*  BUN 73* 49* 71*  CREATININE 7.19* 6.53* 8.45*  CALCIUM 8.6* 8.5* 8.3*  PHOS 4.9* 3.2 2.8    Liver Function Tests: Recent Labs  Lab 11/23/20 1606 11/25/20 1242  11/28/20 1601  ALBUMIN 2.7* 2.8* 2.5*   No results for input(s): LIPASE, AMYLASE in the last 168 hours. No results for input(s): AMMONIA in the last 168 hours.  CBC: Recent Labs  Lab 11/23/20 1606 11/25/20 1242 11/28/20 1601  WBC 12.1* 11.1* 10.3  HGB 7.0* 7.6* 7.0*  HCT 21.1* 25.2* 22.9*  MCV 94.6 99.6 100.0  PLT 196 268 210    Cardiac Enzymes: No results for input(s): CKTOTAL, CKMB, CKMBINDEX, TROPONINI in the last 168 hours.  BNP: Invalid input(s): POCBNP  CBG: Recent Labs  Lab 11/29/20 0610 11/29/20 1214 11/29/20 1647 11/29/20 2119 11/30/20 0615  GLUCAP 89 112* 95 107* 78    Microbiology: Results for orders placed or performed during the hospital encounter of 10/14/20  Resp Panel by RT-PCR (Flu A&B, Covid) Nasopharyngeal Swab     Status: Abnormal   Collection Time: 10/14/20  2:21 AM   Specimen: Nasopharyngeal Swab; Nasopharyngeal(NP) swabs in vial transport medium  Result Value Ref Range Status   SARS Coronavirus 2 by RT PCR POSITIVE (A) NEGATIVE Final    Comment: RESULT CALLED TO, READ BACK BY AND VERIFIED WITH: G CERER RN 10/14/20 0349 JDW (NOTE) SARS-CoV-2 target nucleic acids are DETECTED.  The SARS-CoV-2 RNA is generally detectable in upper respiratory specimens during the acute phase of infection. Positive results are indicative of the presence of the identified virus, but do not rule out bacterial infection or co-infection with other pathogens not detected by the test. Clinical correlation with patient  history and other diagnostic information is necessary to determine patient infection status. The expected result is Negative.  Fact Sheet for Patients: EntrepreneurPulse.com.au  Fact Sheet for Healthcare Providers: IncredibleEmployment.be  This test is not yet approved or cleared by the [place] FDA and  has been authorized for detection and/or diagnosis of SARS-CoV-2 by FDA under an Emergency Use  Authorization (EUA).  This EUA will remain in effect (meaning this test can be used)  for the duration of  the COVID-19 declaration under Section 564(b)(1) of the Act, 21 U.S.C. section 360bbb-3(b)(1), unless the authorization is terminated or revoked sooner.     Influenza A by PCR NEGATIVE NEGATIVE Final   Influenza B by PCR NEGATIVE NEGATIVE Final    Comment: (NOTE) The Xpert Xpress SARS-CoV-2/FLU/RSV plus assay is intended as an aid in the diagnosis of influenza from Nasopharyngeal swab specimens and should not be used as a sole basis for treatment. Nasal washings and aspirates are unacceptable for Xpert Xpress SARS-CoV-2/FLU/RSV testing.  Fact Sheet for Patients: EntrepreneurPulse.com.au  Fact Sheet for Healthcare Providers: IncredibleEmployment.be  This test is not yet approved or cleared by the [place] FDA and has been authorized for detection and/or diagnosis of SARS-CoV-2 by FDA under an Emergency Use Authorization (EUA). This EUA will remain in effect (meaning this test can be used) for the duration of the COVID-19 declaration under Section 564(b)(1) of the Act, 21 U.S.C. section 360bbb-3(b)(1), unless the authorization is terminated or revoked.  Performed at [facility], [place] [address], [place], [place] [address]   Culture, blood (routine x 2)     Status: None   Collection Time: 10/14/20 10:36 AM   Specimen: BLOOD RIGHT HAND  Result Value Ref Range Status   Specimen Description BLOOD RIGHT HAND  Final   Special Requests   Final    BOTTLES DRAWN AEROBIC AND ANAEROBIC Blood Culture adequate volume   Culture   Final    NO GROWTH 5 DAYS Performed at [facility], [place] [address] [place], [place] [address]    Report Status 10/19/2020 FINAL  Final  Culture, blood (routine x 2)     Status: None   Collection Time: 10/14/20 10:36 AM   Specimen: BLOOD LEFT HAND  Result Value Ref Range Status   Specimen Description  BLOOD LEFT HAND  Final   Special Requests   Final    BOTTLES DRAWN AEROBIC AND ANAEROBIC Blood Culture results may not be optimal due to an inadequate volume of blood received in culture bottles   Culture   Final    NO GROWTH 5 DAYS Performed at [facility], [place] [address], [place],  [address]    Report Status 10/19/2020 FINAL  Final  Gastrointestinal Panel by PCR , Stool     Status: None   Collection Time: 10/14/20 11:00 AM   Specimen: Stool  Result Value Ref Range Status   Campylobacter species NOT DETECTED NOT DETECTED Final   Plesimonas shigelloides NOT DETECTED NOT DETECTED Final   Salmonella species NOT DETECTED NOT DETECTED Final   Yersinia enterocolitica NOT DETECTED NOT DETECTED Final   Vibrio species NOT DETECTED NOT DETECTED Final   Vibrio cholerae NOT DETECTED NOT DETECTED Final   Enteroaggregative E coli (EAEC) NOT DETECTED NOT DETECTED Final   Enteropathogenic E coli (EPEC) NOT DETECTED NOT DETECTED Final   Enterotoxigenic E coli (ETEC) NOT DETECTED NOT DETECTED Final   Shiga like toxin producing E coli (STEC) NOT DETECTED NOT DETECTED  Final   Shigella/Enteroinvasive E coli (EIEC) NOT DETECTED NOT DETECTED Final   Cryptosporidium NOT DETECTED NOT DETECTED Final   Cyclospora cayetanensis NOT DETECTED NOT DETECTED Final   Entamoeba histolytica NOT DETECTED NOT DETECTED Final   Giardia lamblia NOT DETECTED NOT DETECTED Final   Adenovirus F40/41 NOT DETECTED NOT DETECTED Final   Astrovirus NOT DETECTED NOT DETECTED Final   Norovirus GI/GII NOT DETECTED NOT DETECTED Final   Rotavirus A NOT DETECTED NOT DETECTED Final   Sapovirus (I, II, IV, and V) NOT DETECTED NOT DETECTED Final    Comment: Performed at [facility], [place] [place], [place] [address]  C Difficile Quick Screen w PCR reflex     Status: None   Collection Time: 10/14/20 11:00 AM   Specimen: STOOL  Result Value Ref Range Status   C Diff antigen NEGATIVE NEGATIVE Final   C  Diff toxin NEGATIVE NEGATIVE Final   C Diff interpretation No C. difficile detected.  Final    Comment: Performed at [facility], [place] [address] [place], [place] [address]  MRSA PCR Screening     Status: None   Collection Time: 10/15/20 12:21 PM   Specimen: Nasal Mucosa; Nasopharyngeal  Result Value Ref Range Status   MRSA by PCR NEGATIVE NEGATIVE Final    Comment:        The GeneXpert MRSA Assay (FDA approved for NASAL specimens only), is one component of a comprehensive MRSA colonization surveillance program. It is not intended to diagnose MRSA infection nor to guide or monitor treatment for MRSA infections. Performed at [facility], [place] [address] [place], [place] [address]   Culture, blood (Routine X 2) w Reflex to ID Panel     Status: None   Collection Time: 10/20/20  4:40 PM   Specimen: BLOOD RIGHT HAND  Result Value Ref Range Status   Specimen Description BLOOD RIGHT HAND  Final   Special Requests   Final    BOTTLES DRAWN AEROBIC AND ANAEROBIC Blood Culture adequate volume   Culture   Final    NO GROWTH 5 DAYS Performed at [facility], [place] [address], [place], [place] [address]    Report Status 10/25/2020 FINAL  Final  Culture, blood (Routine X 2) w Reflex to ID Panel     Status: None   Collection Time: 10/20/20  4:43 PM   Specimen: BLOOD  Result Value Ref Range Status   Specimen Description BLOOD LEFT ANTECUBITAL  Final   Special Requests   Final    BOTTLES DRAWN AEROBIC ONLY Blood Culture results may not be optimal due to an inadequate volume of blood received in culture bottles   Culture   Final    NO GROWTH 5 DAYS Performed at [facility], [place] [address] [place], [place] [address]    Report Status 10/25/2020 FINAL  Final  MRSA PCR Screening     Status: None   Collection Time: 10/21/20 10:08 AM   Specimen: Nasal Mucosa; Nasopharyngeal  Result Value Ref Range Status   MRSA by PCR NEGATIVE NEGATIVE Final    Comment:        The  GeneXpert MRSA Assay (FDA approved for NASAL specimens only), is one component of a comprehensive MRSA colonization surveillance program. It is not intended to diagnose MRSA infection nor to guide or monitor treatment for MRSA infections. Performed at [facility], [place] [address], [place],  [address]  Coagulation Studies: No results for input(s): LABPROT, INR in the last 72 hours.  Urinalysis: No results for input(s): COLORURINE, LABSPEC, PHURINE, GLUCOSEU, HGBUR, BILIRUBINUR, KETONESUR, PROTEINUR, UROBILINOGEN, NITRITE, LEUKOCYTESUR in the last 72 hours.  Invalid input(s): APPERANCEUR    Imaging: No results found.   Medications:    . sodium chloride   Intravenous Once  . amiodarone  400 mg Oral BID  . B-complex with vitamin C  1 tablet Oral Daily  . Chlorhexidine Gluconate Cloth  6 each Topical Q0600  . collagenase   Topical Daily  . darbepoetin (ARANESP) injection - DIALYSIS  200 mcg Intravenous Q Tue-HD  . docusate  100 mg Oral BID  . feeding supplement (NEPRO CARB STEADY)  237 mL Oral TID WC  . Gerhardt's butt cream   Topical TID  . insulin aspart  3-9 Units Subcutaneous TID WC  . lactobacillus acidophilus  2 tablet Oral TID  . lidocaine  1 patch Transdermal Q24H  . mouth rinse  15 mL Mouth Rinse BID  . midodrine  10 mg Oral TID WC  . predniSONE  5 mg Oral Q breakfast  . sevelamer carbonate  0.8 g Oral TID WC  . sucralfate  1 g Oral TID WC & HS  . triamcinolone 0.1 % cream : eucerin   Topical BID   acetaminophen, bisacodyl, calcium carbonate, chlorpheniramine-HYDROcodone, diphenhydrAMINE, loperamide, polyethylene glycol, prochlorperazine **OR** prochlorperazine **OR** prochlorperazine, Resource ThickenUp Clear, traZODone  Assessment/ Plan:  1. Acute kidney Injury: With baseline creatinine of 1.2 and acute kidney injury likely from ATN associated with Covid infection/acute pancreatitis and shock. Unfortunately, remains anuric/with minimal urine  output recordedandto continuehemodialysis on a TTS schedule while here (last dialyzed 2/5).He is suspected to have evolved into end-stage renal disease with plan to pursue permanent access as an outpatient after he gets physically stronger. Status post HD on 11/28/2020 with 2 L removed next dialysis will be planned for 11/30/2020  2.Acute pancreatitis:This was noted on CT scan earlier (1/3 and 1/7). 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  3. Acute hypoxic respiratory failure:Secondary to Covid pneumonia and possible aspiration pneumonitis. Status post corticosteroids and supportive management-now on room air and without supplemental oxygen requirements.  4. Anemia: Secondary to acute/critical illness and GI losses.Continue current dose of ESA.  IV iron prohibitive because of very high ferritin level.  Received PRBC.  5 Secondary hyperparathyroidism:Phosphorus level improved after starting sevelamer.  PTH level 217 at goal.  6 Hypotension: On midodrine.  Blood pressure acceptable.   LOS: Lewis @TODAY @9 :32 AM

## 2020-11-30 NOTE — Progress Notes (Signed)
Patient ID: [place] Hurley., male   DOB: Mar 05, 1931, 87 y.o.   MRN: [id number] Met with pt to get the Access [employer] application for transportation signed so could scan to contact person. Colleen-navigator is arranging location and spot for OP-HD. Pt reports wife is stressed thinking about all she will need to do for him and the appointments he will need to go too. Wife to be here Monday afternoon to do hands on education with therapy team.

## 2020-11-30 NOTE — Progress Notes (Signed)
Occupational Therapy Session Note  Patient Details  Name: [place] Hurley. MRN: [id number] Date of Birth: March 23, 1931  Today's Date: 11/30/2020 OT Individual Time: 0755-0900 OT Individual Time Calculation (min): 65 min    Short Term Goals: Week 2:  OT Short Term Goal 1 (Week 2): Pt will transfer to toilet with MIN A consistently OT Short Term Goal 1 - Progress (Week 2): Progressing toward goal OT Short Term Goal 2 (Week 2): Pt will stand to complete 1 grooming task to demo improved activity tolerance OT Short Term Goal 2 - Progress (Week 2): Progressing toward goal OT Short Term Goal 3 (Week 2): Pt will complete 2/3 toileting steps consistently OT Short Term Goal 3 - Progress (Week 2): Progressing toward goal OT Short Term Goal 4 (Week 2): Pt will don socks/shoes with min assist with AE PRN OT Short Term Goal 4 - Progress (Week 2): Progressing toward goal  Skilled Therapeutic Interventions/Progress Updates:    Patient in bed, alert and ready for therapy.  Pain in sacral area noted, reviewed positioning and weight shifts bed level and w/c level.  Completed LB bathing and dressing bed level - overall max A, attempted use of reacher for pants over feet.  Clothing management with rolling in bed mod A.  Rolling CS with rails, side lying to sitting edge of bed mod A.   Sit to stand from elevated bed surface CGA, SPT to w/c CGA.  Completed UB bathing/dressing and oral care w/c level at sink with set up/CS.  He remained seated in w/c at close of session, seat belt alarm set and call bell in hand.    Therapy Documentation Precautions:  Precautions Precautions: Fall,Other (comment) Precaution Comments: Bowel incontinence (apparently had flexiseal removed yesterday) Restrictions Weight Bearing Restrictions: No   Therapy/Group: Individual Therapy  Carlos Levering 11/30/2020, 7:38 AM

## 2020-11-30 NOTE — Consult Note (Signed)
[place] Nurse wound follow up PT performing hydrotherapy, L. Kirkman, contacted me explaining there was heavy blue/green drainage from the right buttock wound.  I have ordered 3 days of bid Dakin's solution, 1/4 %, then resumption of daily Santyl. Val Riles, RN, MSN, CWOCN, CNS-BC, pager [phone]

## 2020-11-30 NOTE — Progress Notes (Signed)
Physical Therapy Wound Treatment Patient Details  Name: [place] Hurley. MRN: [id number] Date of Birth: [birth year]  Today's Date: 11/30/2020 Time:  2876-8115     Subjective  Subjective: "I was shocked when I saw a picture of the wound yesterday." Patient and Family Stated Goals: heal wound Date of Onset:  (unknown) Prior Treatments: dressing change  Pain Score:  Pt tolerated treatment well without premedication.  Wound Assessment  Pressure Injury 11/10/20 Buttocks Right;Mid Unstageable - Full thickness tissue loss in which the base of the injury is covered by slough (yellow, tan, gray, green or brown) and/or eschar (tan, brown or black) in the wound bed. red open area of partial s (Active)  Dressing Type Gauze (Comment);Moist to dry;Foam - Lift dressing to assess site every shift 11/30/20 1435  Dressing Changed;Clean;Dry;Intact 11/30/20 1435  Dressing Change Frequency Daily 11/30/20 1435  State of Healing Early/partial granulation 11/30/20 1435  Site / Wound Assessment Yellow;Pink;Pale 11/30/20 1435  % Wound base Red or Granulating 40% 11/30/20 1435  % Wound base Yellow/Fibrinous Exudate 60% 11/30/20 1435  % Wound base Black/Eschar 0% 11/30/20 1435  % Wound base Other/Granulation Tissue (Comment) 0% 11/30/20 1435  Peri-wound Assessment Erythema (blanchable);Intact 11/30/20 1435  Wound Length (cm) 3.8 cm 11/29/20 1255  Wound Width (cm) 2.5 cm 11/29/20 1255  Wound Depth (cm) 0.5 cm 11/29/20 1255  Wound Surface Area (cm^2) 9.5 cm^2 11/29/20 1255  Wound Volume (cm^3) 4.75 cm^3 11/29/20 1255  Margins Unattached edges (unapproximated) 11/30/20 1435  Drainage Amount Minimal 11/30/20 1435  Drainage Description Green;No odor 11/30/20 1435  Treatment Debridement (Selective);Hydrotherapy (Pulse lavage);Packing (Saline gauze) 11/30/20 1435  Santyl applied to wound bed prior to applying dressing.   Hydrotherapy Pulsed lavage therapy - wound location: superior gluteal cleft Pulsed  Lavage with Suction (psi): 12 psi Pulsed Lavage with Suction - Normal Saline Used: 1000 mL Pulsed Lavage Tip: Tip with splash shield Selective Debridement Selective Debridement - Location: superior gluteal cleft Selective Debridement - Tools Used: Forceps;Scissors Selective Debridement - Tissue Removed: yellow slough   Wound Assessment and Plan  Wound Therapy - Assess/Plan/Recommendations Wound Therapy - Clinical Statement: Progressing with removal of unviable tissue. Noted a good amount of bright blue/green drainage on the dressing when removed. Discussed with Val Riles, [facility] regarding addition of Dakin's solution. Will begin tomorrow. This patient will benefit from continued hydrotherapy for debridement, and promote wound bed healing. Wound Therapy - Functional Problem List: decreased mobility Factors Delaying/Impairing Wound Healing: Immobility Hydrotherapy Plan: Debridement;Patient/family education;Pulsatile lavage with suction Wound Therapy - Frequency: 6X / week Wound Therapy - Follow Up Recommendations: Home health RN Wound Plan: see above  Wound Therapy Goals- Improve the function of patient's integumentary system by progressing the wound(s) through the phases of wound healing (inflammation - proliferation - remodeling) by: Decrease Necrotic Tissue to: 10 Decrease Necrotic Tissue - Progress: Progressing toward goal Increase Granulation Tissue to: 90 Increase Granulation Tissue - Progress: Progressing toward goal Goals/treatment plan/discharge plan were made with and agreed upon by patient/family: Yes Time For Goal Achievement: 7 days Wound Therapy - Potential for Goals: Good  Goals will be updated until maximal potential achieved or discharge criteria met.  Discharge criteria: when goals achieved, discharge from hospital, MD decision/surgical intervention, no progress towards goals, refusal/missing three consecutive treatments without notification or medical reason.  GP      Thelma Comp 11/30/2020, 2:42 PM   Rolinda Roan, PT, DPT Acute Rehabilitation Services Pager: [phone] Office: [phone]

## 2020-11-30 NOTE — Progress Notes (Signed)
Physical Therapy Session Note  Patient Details  Name: [place] Hurley. MRN: [id number] Date of Birth: 27-Sep-1931  Today's Date: 11/30/2020 PT Individual Time: 0900-0953 PT Individual Time Calculation (min): 53 min   Short Term Goals: Week 2:  PT Short Term Goal 1 (Week 2): Pt will perform stand<>pivot transfer with LRAD and CGA PT Short Term Goal 1 - Progress (Week 2): Met PT Short Term Goal 2 (Week 2): Pt will ambulate 79f with LRAD and min A PT Short Term Goal 2 - Progress (Week 2): Met PT Short Term Goal 3 (Week 2): Pt will perform bed mobility with CGA consistantly PT Short Term Goal 3 - Progress (Week 2): Progressing toward goal Week 3:  PT Short Term Goal 1 (Week 3): STG=LTG due to LOS  Skilled Therapeutic Interventions/Progress Updates:   Received pt sitting in WC, pt agreeable to therapy, and denied any pain during session. Session with emphasis on functional mobility/transfers, generalized strengthening, dynamic standing balance/coordination, ambulation, discharge planning and home setup, and improved activity tolerance. Recreational therapy present during session. Pt performed WC mobility 1051fusing BUE and supervision to dayroom. Extensive discussion had regarding home setup. Discussed again need to install ramp to enter home. Pt reported he has someone in mind who he can call and speak with today. Also discussed pt's parking lot and rules for parking in specific places and how to negotiate obstacles to get pt to door safely. Discussed bed mobility obstacles and therapist's recommendation for hospital bed. However, pt reported he wife would not allow the hospital bed to be in the living room and there is no way to rearrange furniture for easy access to the bed. Pt mentioned lift for ease for his wife. Therapist explained that pt is currently min A and does to need a lift and that based off pictures and what pt describes, one will not fit to get to the bed; pt verbalized  understanding. Pt with concerns regarding the sink in bathroom; encouraged pt to speak with OT further regarding this concern. Pt ambulated 1355fith RW and CGA and requested to sit due to fatigue. Pt transferred sit<>stand at table in dayroom with RW and min A x 2 trials and worked on dynCatering managerth recreational therapist with BUE support on RW [place] to BUE support on table with CGA for balance. Pt able to remain standing <60 seconds prior to requesting to sit due to dizziness and weakness. Pt transported back to room in [facility]. Concluded session with pt sitting in WC, needs within reach, and seatbelt alarm on.    Therapy Documentation Precautions:  Precautions Precautions: Fall,Other (comment) Precaution Comments: Bowel incontinence (apparently had flexiseal removed yesterday) Restrictions Weight Bearing Restrictions: No  Therapy/Group: Individual Therapy AnnAlfonse Alpers, DPT   11/30/2020, 7:17 AM

## 2020-11-30 NOTE — Progress Notes (Signed)
[place] PHYSICAL MEDICINE & REHABILITATION PROGRESS NOTE   Subjective/Complaints: Patient seen sitting up in his chair working with therapy this morning.  He states he slept well overnight.  Had prolonged discussion with patient regarding discharge plans, support, etc.  Discussed my conversation with his wife as well.  Patient states that he will discuss with his wife.  He was seen by nephrology yesterday, notes reviewed-working on outpatient HD.  ROS: Denies CP, SOB, N/V/D  Objective:   No results found. Recent Labs    11/28/20 1601  WBC 10.3  HGB 7.0*  HCT 22.9*  PLT 210   Recent Labs    11/28/20 1601  NA 131*  K 3.7  CL 94*  CO2 20*  GLUCOSE 125*  BUN 71*  CREATININE 8.45*  CALCIUM 8.3*    Intake/Output Summary (Last 24 hours) at 11/30/2020 1637 Last data filed at 11/29/2020 1900 Gross per 24 hour  Intake 120 ml  Output --  Net 120 ml     Pressure Injury 11/10/20 Buttocks Right;Mid Unstageable - Full thickness tissue loss in which the base of the injury is covered by slough (yellow, tan, gray, green or brown) and/or eschar (tan, brown or black) in the wound bed. red open area of partial s (Active)  11/10/20 1721  Location: Buttocks  Location Orientation: Right;Mid  Staging: Unstageable - Full thickness tissue loss in which the base of the injury is covered by slough (yellow, tan, gray, green or brown) and/or eschar (tan, brown or black) in the wound bed.  Wound Description (Comments): red open area of partial skin loss with eschar covering wound bed  Present on Admission: Yes    Physical Exam: Vital Signs Blood pressure (!) 96/45, pulse 78, temperature 97.7 F (36.5 C), temperature source Oral, resp. rate 20, height 5\' 10"  (1.778 m), weight 75.9 kg, SpO2 94 %.  Constitutional: No distress . Vital signs reviewed. HENT: Normocephalic.  Atraumatic. Eyes: EOMI. No discharge. Cardiovascular: No JVD.  RRR. Respiratory: Normal effort.  No stridor.  Bilateral  clear to auscultation. GI: Non-distended.  BS +. Skin: Warm and dry.   Psych: Normal mood.  Normal behavior. Musc: No edema in extremities.  No tenderness in extremities. Neuro: Alert Motor: Bilateral upper extremities: 4+/5 throughout, stable Left lower extremity: Hip flexion, knee extension 4-/5, ankle dorsiflexion 4+/5, stable Right lower extremity: Hip flexion, knee extension 3 +-4-/5, ankle dorsiflexion 4+/5, stable  Assessment/Plan: 1. Functional deficits which require 3+ hours per day of interdisciplinary therapy in a comprehensive inpatient rehab setting.  Physiatrist is providing close team supervision and 24 hour management of active medical problems listed below.  Physiatrist and rehab team continue to assess barriers to discharge/monitor patient progress toward functional and medical goals  Care Tool:  Bathing    Body parts bathed by patient: Right arm,Left arm,Chest,Abdomen,Face,Front perineal area   Body parts bathed by helper: Buttocks,Right upper leg,Left upper leg,Right lower leg,Left lower leg     Bathing assist Assist Level: Maximal Assistance - Patient 24 - 49%     Upper Body Dressing/Undressing Upper body dressing   What is the patient wearing?: Pull over shirt,Button up shirt    Upper body assist Assist Level: Supervision/Verbal cueing    Lower Body Dressing/Undressing Lower body dressing      What is the patient wearing?: Pants,Incontinence brief     Lower body assist Assist for lower body dressing: Maximal Assistance - Patient 25 - 49%     Toileting Toileting    Toileting assist  Assist for toileting: Maximal Assistance - Patient 25 - 49%     Transfers Chair/bed transfer  Transfers assist     Chair/bed transfer assist level: Contact Guard/Touching assist Chair/bed transfer assistive device: Walker,Armrests   Locomotion Ambulation   Ambulation assist   Ambulation activity did not occur: Safety/medical concerns  Assist level:  Contact Guard/Touching assist Assistive device: Walker-rolling Max distance: 11ft   Walk 10 feet activity   Assist  Walk 10 feet activity did not occur: Safety/medical concerns  Assist level: Contact Guard/Touching assist Assistive device: Walker-rolling   Walk 50 feet activity   Assist Walk 50 feet with 2 turns activity did not occur: Safety/medical concerns         Walk 150 feet activity   Assist Walk 150 feet activity did not occur: Safety/medical concerns         Walk 10 feet on uneven surface  activity   Assist Walk 10 feet on uneven surfaces activity did not occur: Safety/medical concerns         Wheelchair     Assist Will patient use wheelchair at discharge?: Yes Type of Wheelchair: Manual    Wheelchair assist level: Supervision/Verbal cueing Max wheelchair distance: 153ft    Wheelchair 50 feet with 2 turns activity    Assist        Assist Level: Supervision/Verbal cueing   Wheelchair 150 feet activity     Assist      Assist Level: Supervision/Verbal cueing     Medical Problem List and Plan: 1.  Generalized weakness affecting ability to stand or complete ADL tasks secondary to debility.  Continue CIR   Previously discussed with wife plans for discharge and goals of care 2.  Antithrombotics: -DVT/anticoagulation:  Mechanical: Sequential compression devices, below knee Bilateral lower extremities due to GI bleed.             -antiplatelet therapy: N/A 3. Pain Management: Tylenol prn.   Controlled with meds on 2/17 4. Mood: LCSW to follow for evaluation and support.              -antipsychotic agents: N/a 5. Neuropsych: This patient is capable of making decisions on his own behalf. 6. Skin/Wound Care: Routine pressure relief measures.   Discussed bedding and pressure relief for healing of sacral ulcer, discussed again, improving 7. Fluids/Electrolytes/Nutrition: Strict I/Os.  Labs with HD.discussed 1228ml fluid restriction   8.  ESRD: On midodrine prior to HD to help support BP.   Permanent access as outpatient  Appreciate nephro recs 9. Chronic systolic CHF/CAF: Strict I/Os. Fluid status managed with HD.              Daily weights  Lasix per Nephro, appreciate Recs  Amio per Cards recs  Filed Weights   11/28/20 1818 11/29/20 0500 11/30/20 0500  Weight: 79.5 kg 78 kg 75.9 kg   ?  Reliability on 2/17 10.  Pancreatitis: Has completed antibiotic course. Tolerating diet without nausea, diarrhea or abdominal pain.              Monitor closely.  Tolerating diet at present 11. LGIB: On Carafate ac/hs.  12. Acute on chronic anemia: On aranresp weekly.              Labs with HD, transfused on 2/5  Hemoglobin 7.0 on 2/15 13. Thrombocytopenia: Monitor for signs of bleeding.              Labs with HD  Resolved 14. Acute respiratory failure: Has resolved.  See #16 15.  Leukocytosis-some steroid effect  WBCs 10.3 on 2/15  Afebrile 16.  Steroid-induced hyperglycemia  SSI  Prednisone decreased to 20 on 2/4, decreased to 10 on 2/9, decreased to 5 on 2/15  Rlatively controlled on 2/17   17.  Dizziness  Likely due to anemia and soft blood pressures and limited p.o. intake in accordance with HD, patient not drinking nearly up to 1200cc fluid restriction  Trying to drink more, discussed again  Continue midodrine 18. Slow transit constipation:  Improving  LOS: 20 days A FACE TO FACE EVALUATION WAS PERFORMED  Elsey Holts Lorie Phenix 11/30/2020, 4:37 PM

## 2020-11-30 NOTE — Progress Notes (Signed)
Recreational Therapy Session Note  Patient Details  Name: Raymond Hurley. MRN: [id number] Date of Birth: Feb 14, 1931 Today's Date: 11/22/2020  Pain: no c/o Skilled Therapeutic Interventions/Progress Updates: Session focued on activity tolerance, dynamic standing balance & discharge planning  during co-treat with PT.  Pt propelled w/c using BUEs with supervision ~200 feet. While resting discussed discharge planing as pt with questions about home set up and needed equipment/services.  Emphasis on home entry, need for ramp.  Team recommends hospital bed, but pt declines stating that he did not think having a hospital bed in the living room was workable.  Pt also stated that bedroom furniture could not be rearranged due to space constraints.  Pt asking about lift equipment, specifically the St[employer].  Showed him information online including price.  Pt stated it was too expensive.  With with further discussion, PT shared that pt would not be able to use the life due to spacing constraints should he have one.  Pt stated understanding.  Pt ambulated with RW  ~13 feet with contact guard assist.  After seated rest break, pt stood with min assist for Blackjack card game with emphasis on standing balance, activity tolerance, with 1UE support.  Pt stood less than 60 seconds before needing seated rest break.

## 2020-11-30 NOTE — Progress Notes (Signed)
Occupational Therapy Session Note  Patient Details  Name: Raymond Hurley. MRN: [id number] Date of Birth: 19-Oct-1930  Today's Date: 11/30/2020 OT Individual Time: 1020-1100 OT Individual Time Calculation (min): 40 min    Short Term Goals: Week 1:  OT Short Term Goal 1 (Week 1): Pt iwll transfer to toilet with MIN A consistently OT Short Term Goal 1 - Progress (Week 1): Progressing toward goal OT Short Term Goal 2 (Week 1): Pt will thread BLE into pants wiht AE PRN OT Short Term Goal 2 - Progress (Week 1): Met OT Short Term Goal 3 (Week 1): PT will complete 1/3 steps of toileting OT Short Term Goal 3 - Progress (Week 1): Met OT Short Term Goal 4 (Week 1): pt will stand to groom to demo improved activity tolernace OT Short Term Goal 4 - Progress (Week 1): Progressing toward goal  Skilled Therapeutic Interventions/Progress Updates:    1:1. Pt received in w/c with RN in room providing medications. Pt requries increased time d/t poor desterity to manage/manipulate pills 1 at a time to take. Pt agreeable to getting out of room for tx. Pt completes transfer to/from EOM with MIN A with RW with VC for hand placement. Pt completes 4x1 min beach ball volley with prolonged rest break d/t poor activity tolerance 2 rounds seated EOM and 2 rounds in w/c. Pt returned to bed in prep for hydroterapy at end of session Exited session with pt seated in bed, exit alarm on and call light in reach   Therapy Documentation Precautions:  Precautions Precautions: Fall,Other (comment) Precaution Comments: Bowel incontinence (apparently had flexiseal removed yesterday) Restrictions Weight Bearing Restrictions: No General:   Vital Signs: Therapy Vitals Temp: 97.7 F (36.5 C) Temp Source: Oral Pulse Rate: 74 Resp: 20 BP: 107/89 Patient Position (if appropriate): Lying Oxygen Therapy SpO2: 94 % O2 Device: Room Air Pain:   ADL: ADL Eating: Set up Where Assessed-Eating: Bed level Grooming:  Supervision/safety Where Assessed-Grooming: Edge of bed Lower Body Bathing: Moderate assistance Where Assessed-Lower Body Bathing: Edge of bed Upper Body Dressing: Minimal assistance Where Assessed-Upper Body Dressing: Edge of bed Lower Body Dressing: Maximal assistance Where Assessed-Lower Body Dressing: Edge of bed Vision   Perception    Praxis   Exercises:   Other Treatments:     Therapy/Group: Individual Therapy  [name] 11/30/2020, 6:51 AM

## 2020-11-30 NOTE — Progress Notes (Signed)
Wife called to tell this worker they would not be using [employer], they were not those kind of people. She plans on lining up [employer] to transport pt back and forth to HD. She is also planning on hiring 24/7 care and is talking with [facility] care regarding hired CNA. Asked if OP-HD set up yet? Informed once Colleen-navigator confirms she will contact wife to let her know place and chair slot. Wife working on setting up care for when discharged 2/24.

## 2020-12-01 LAB — GLUCOSE, CAPILLARY
Glucose-Capillary: 100 mg/dL — ABNORMAL HIGH (ref 70–99)
Glucose-Capillary: 120 mg/dL — ABNORMAL HIGH (ref 70–99)
Glucose-Capillary: 113 mg/dL — ABNORMAL HIGH (ref 70–99)
Glucose-Capillary: 114 mg/dL — ABNORMAL HIGH (ref 70–99)

## 2020-12-01 MED ORDER — AMIODARONE HCL 200 MG PO TABS: 200.0000 mg | ORAL_TABLET | Freq: Every day | ORAL | Status: DC

## 2020-12-01 MED ORDER — LACTINEX PO CHEW
1.0000 | CHEWABLE_TABLET | Freq: Three times a day (TID) | ORAL | Status: DC
  Administered 2020-12-02 – 2020-12-07 (×9): 1 via ORAL
  Filled 2020-12-01 (×21): qty 1

## 2020-12-01 MED ORDER — AMIODARONE HCL 200 MG PO TABS
200.0000 mg | ORAL_TABLET | Freq: Two times a day (BID) | ORAL | Status: DC
  Administered 2020-12-01 – 2020-12-07 (×12): 200 mg via ORAL
  Filled 2020-12-01 (×12): qty 1

## 2020-12-01 NOTE — Progress Notes (Signed)
Patient has been accepted at [facility] on a TTS schedule with a seat time of 11:10am. He needs to arrive to his appointments at 10:50am. Per CIR CSW/B. Dupree, patient will be discharged on Thursday, 12/07/20. This is unfortunately a dialysis day and will mean that patient starts in the clinic on a Saturday. There is no MWF schedule available at [facility] for patient. He will need to report to the clinic on Friday, 12/08/20, to sign intake paperwork in order to start at the clinic on Saturday, 12/09/20. He needs to arrive at his normal time, 10:50am on 2/26. Renal Navigator met with patient and wife at [facility] bedside and talked about discharge plans for over 30 minutes. Navigator provided support as they are making plans for discharge. Patient's wife states she is hiring medical transport for her husband and has the name of the company at home. She does not need assistance with this, though Navigator notes that CIR CSW completed an application for [employer] and patient has been pre-certified, as he qualifies for this transportation service. Patient's wife informed Navigator that she would not be using this service. She is understanding of discharge plan and both wife and patient stated appreciation of the visit.   Alphonzo Cruise, [facility] Renal Navigator [phone]

## 2020-12-01 NOTE — Progress Notes (Signed)
Pt had another episode of choking while taking morning medication, was given liquid solution of colace, and stated that it went down the wrong way. Pt "feels like medication gets stuck in mucus in throat if the pill does not go down straight" medication given crushed in apple sauce for morning medication. Pt continuing to use suction set up at bedside for secretion build up.  MD made aware, and PA made aware.   Dayna Ramus

## 2020-12-01 NOTE — Progress Notes (Signed)
Occupational Therapy Weekly Progress Note  Patient Details  Name: Raymond Hurley. MRN: [id number] Date of Birth: July 28, 1931  Beginning of progress report period: November 24, 2020 End of progress report period: December 01, 2020  Today's Date: 12/01/2020 OT Individual Time: 5465-6812 OT Individual Time Calculation (min): 55 min    Patient has met 2 of 4 short term goals.  Pt is making steady progress towards goals.  Pt currently is able to complete sit > stand from majority of surfaces with min assist and use of RW.  Pt occasionally requires up to mod assist to stand from toilet after prolonged sitting for BM.  Pt is able to complete stand pivot transfers with RW with CGA once standing.  Pt is able to utilize AE for LB bathing and dressing to complete at min assist at this point.  Pt continues to require frequent rest breaks due to decreased endurance and reports of dizziness.  Pt only tolerates standing up to 30-40 seconds at a time before reporting dizziness and requesting to sit.  Pt's wife plans to hire 24/7 assist and take pt home, therefore focus on education with pt and wife regarding energy conservation, ADLs, and functional transfers.    Patient continues to demonstrate the following deficits: muscle weakness,decreased cardiorespiratoy enduranceand decreased sitting balance, decreased standing balance, decreased postural control and decreased balance strategies  and therefore will continue to benefit from skilled OT intervention to enhance overall performance with BADL and Reduce care partner burden.  Patient progressing toward long term goals..  Continue plan of care.  OT Short Term Goals Week 3:  OT Short Term Goal 1 (Week 3): Pt will transfer to toilet with MIN A consistently OT Short Term Goal 1 - Progress (Week 3): Met OT Short Term Goal 2 (Week 3): Pt will stand to complete 1 grooming task to demo improved activity tolerance OT Short Term Goal 2 - Progress (Week 3):  Progressing toward goal OT Short Term Goal 3 (Week 3): Pt will complete 2/3 toileting steps consistently OT Short Term Goal 3 - Progress (Week 3): Progressing toward goal OT Short Term Goal 4 (Week 3): Pt will don socks/shoes with min assist with AE PRN OT Short Term Goal 4 - Progress (Week 3): Met Week 4:  OT Short Term Goal 1 (Week 4): STG = LTGs due to remaining LOS  Skilled Therapeutic Interventions/Progress Updates:    Treatment session with focus on self-care retraining, functional transfers, and d/c planning.  Pt received upright in bed on the phone discussing d/c with friend(?).  Pt discussing possible hospital bed use at home, therapist would recommend use as pt requires HOB elevated and bed rails for mobility.  Pt completed bed mobility with CGA with HOB elevated and use of bed rail.  Pt completed sit > stand from slightly elevated EOB with min assist.  Pt then completed stand pivot transfer to w/c with RW with CGA.  Pt declined any bathing/dressing this AM due to completing it yesterday.  Pt expressed desire to complete grooming tasks at sink.  Pt reports not able to stand for any aspect of grooming due to increased dizziness with transfers this AM.  Pt completed all grooming tasks seated at sink with setup for items and positioning.  Pt remained upright in w/c with seat belt alarm on and all needs in reach.  Pt continues to demonstrate increased dizziness with initial transfers OOB requiring ~2 mins to recover.  Pt also with increased dizziness the day post  HD.  Therapist continues to educate on gaze stabilization during mobility and use of rest breaks between activities.   Therapy Documentation Precautions:  Precautions Precautions: Fall,Other (comment) Precaution Comments: Bowel incontinence (apparently had flexiseal removed yesterday) Restrictions Weight Bearing Restrictions: No General:   Vital Signs: Therapy Vitals Temp: 98.2 F (36.8 C) Pulse Rate: 82 Resp: 16 BP: (!)  111/51 Patient Position (if appropriate): Lying Oxygen Therapy SpO2: 94 % O2 Device: Room Air Pain:  Pt with no c/o pain   Therapy/Group: Individual Therapy  [name] 12/01/2020, 7:40 AM

## 2020-12-01 NOTE — Progress Notes (Signed)
[facility] ROUNDING NOTE   Subjective:   Brief history: This is a 87 year old gentleman who remained anuric oliguric following Covid infection.  He is now CIR requiring intensive physical therapy.  His baseline creatinine is 1.2.  However he has been dialysis dependent since admission and is now on a Tuesday Thursday Saturday dialysis schedule.  There is no recorded urine output.  Patient undergoing CLIP.  No definitive chair yet.  Interval history: No significant changes in the past 24 hours.  Tolerated dialysis yesterday with 1.7 L of ultrafiltration.  Patient states he felt tired but no other complaints.  Objective:  Vital signs in last 24 hours:  Temp:  [97.9 F (36.6 C)-98.4 F (36.9 C)] 98.2 F (36.8 C) (02/18 0610) Pulse Rate:  [78-90] 82 (02/18 0610) Resp:  [16-17] 16 (02/18 0610) BP: (95-111)/(45-65) 111/51 (02/18 0610) SpO2:  [94 %-98 %] 94 % (02/18 0610) Weight:  [75.8 kg] 75.8 kg (02/18 0610)  Weight change: -0.149 kg Filed Weights   11/29/20 0500 11/30/20 0500 12/01/20 0610  Weight: 78 kg 75.9 kg 75.8 kg    Intake/Output: I/O last 3 completed shifts: In: -  Out: 1737 [[id number]]   Intake/Output this shift:  No intake/output data recorded.  General:  Sitting in chair, no distress Heart:RRR Lungs:  Bilateral chest rise with no increased work of breathing Abdomen:soft, Non-tender, non-distended Extremities: Trace ankle edema, stable Dialysis Access: Right IJ TDC placed by IR on 1/24.  Site clean   Basic Metabolic Panel: Recent Labs  Lab 11/25/20 1242 11/28/20 1601  NA 134* 131*  K 3.7 3.7  CL 96* 94*  CO2 20* 20*  GLUCOSE 127* 125*  BUN 49* 71*  CREATININE 6.53* 8.45*  CALCIUM 8.5* 8.3*  PHOS 3.2 2.8    Liver Function Tests: Recent Labs  Lab 11/25/20 1242 11/28/20 1601  ALBUMIN 2.8* 2.5*   No results for input(s): LIPASE, AMYLASE in the last 168 hours. No results for input(s): AMMONIA in the last 168 hours.  CBC: Recent  Labs  Lab 11/25/20 1242 11/28/20 1601  WBC 11.1* 10.3  HGB 7.6* 7.0*  HCT 25.2* 22.9*  MCV 99.6 100.0  PLT 268 210    Cardiac Enzymes: No results for input(s): CKTOTAL, CKMB, CKMBINDEX, TROPONINI in the last 168 hours.  BNP: Invalid input(s): POCBNP  CBG: Recent Labs  Lab 11/30/20 1208 11/30/20 1829 11/30/20 2056 12/01/20 0608 12/01/20 1141  GLUCAP 109* 96 131* 100* 120*    Microbiology: Results for orders placed or performed during the hospital encounter of 10/14/20  Resp Panel by RT-PCR (Flu A&B, Covid) Nasopharyngeal Swab     Status: Abnormal   Collection Time: 10/14/20  2:21 AM   Specimen: Nasopharyngeal Swab; Nasopharyngeal(NP) swabs in vial transport medium  Result Value Ref Range Status   SARS Coronavirus 2 by RT PCR POSITIVE (A) NEGATIVE Final    Comment: RESULT CALLED TO, READ BACK BY AND VERIFIED WITH: G CERER RN 10/14/20 0349 JDW (NOTE) SARS-CoV-2 target nucleic acids are DETECTED.  The SARS-CoV-2 RNA is generally detectable in upper respiratory specimens during the acute phase of infection. Positive results are indicative of the presence of the identified virus, but do not rule out bacterial infection or co-infection with other pathogens not detected by the test. Clinical correlation with patient history and other diagnostic information is necessary to determine patient infection status. The expected result is Negative.  Fact Sheet for Patients: EntrepreneurPulse.com.au  Fact Sheet for Healthcare Providers: IncredibleEmployment.be  This test is not yet  approved or cleared by the [place] and  has been authorized for detection and/or diagnosis of SARS-CoV-2 by FDA under an Emergency Use Authorization (EUA).  This EUA will remain in effect (meaning this test can be used)  for the duration of  the COVID-19 declaration under Section 564(b)(1) of the Act, 21 U.S.C. section 360bbb-3(b)(1), unless the  authorization is terminated or revoked sooner.     Influenza A by PCR NEGATIVE NEGATIVE Final   Influenza B by PCR NEGATIVE NEGATIVE Final    Comment: (NOTE) The Xpert Xpress SARS-CoV-2/FLU/RSV plus assay is intended as an aid in the diagnosis of influenza from Nasopharyngeal swab specimens and should not be used as a sole basis for treatment. Nasal washings and aspirates are unacceptable for Xpert Xpress SARS-CoV-2/FLU/RSV testing.  Fact Sheet for Patients: EntrepreneurPulse.com.au  Fact Sheet for Healthcare Providers: IncredibleEmployment.be  This test is not yet approved or cleared by the [place] FDA and has been authorized for detection and/or diagnosis of SARS-CoV-2 by FDA under an Emergency Use Authorization (EUA). This EUA will remain in effect (meaning this test can be used) for the duration of the COVID-19 declaration under Section 564(b)(1) of the Act, 21 U.S.C. section 360bbb-3(b)(1), unless the authorization is terminated or revoked.  Performed at [facility], [place] [address], [place], [place] [address]   Culture, blood (routine x 2)     Status: None   Collection Time: 10/14/20 10:36 AM   Specimen: BLOOD RIGHT HAND  Result Value Ref Range Status   Specimen Description BLOOD RIGHT HAND  Final   Special Requests   Final    BOTTLES DRAWN AEROBIC AND ANAEROBIC Blood Culture adequate volume   Culture   Final    NO GROWTH 5 DAYS Performed at [facility], [place] [address], [place], [place] [address]    Report Status 10/19/2020 FINAL  Final  Culture, blood (routine x 2)     Status: None   Collection Time: 10/14/20 10:36 AM   Specimen: BLOOD LEFT HAND  Result Value Ref Range Status   Specimen Description BLOOD LEFT HAND  Final   Special Requests   Final    BOTTLES DRAWN AEROBIC AND ANAEROBIC Blood Culture results may not be optimal due to an inadequate volume of blood received in culture bottles   Culture   Final     NO GROWTH 5 DAYS Performed at [facility], [place] [address], [place], [place] [address]    Report Status 10/19/2020 FINAL  Final  Gastrointestinal Panel by PCR , Stool     Status: None   Collection Time: 10/14/20 11:00 AM   Specimen: Stool  Result Value Ref Range Status   Campylobacter species NOT DETECTED NOT DETECTED Final   Plesimonas shigelloides NOT DETECTED NOT DETECTED Final   Salmonella species NOT DETECTED NOT DETECTED Final   Yersinia enterocolitica NOT DETECTED NOT DETECTED Final   Vibrio species NOT DETECTED NOT DETECTED Final   Vibrio cholerae NOT DETECTED NOT DETECTED Final   Enteroaggregative E coli (EAEC) NOT DETECTED NOT DETECTED Final   Enteropathogenic E coli (EPEC) NOT DETECTED NOT DETECTED Final   Enterotoxigenic E coli (ETEC) NOT DETECTED NOT DETECTED Final   Shiga like toxin producing E coli (STEC) NOT DETECTED NOT DETECTED Final   Shigella/Enteroinvasive E coli (EIEC) NOT DETECTED NOT DETECTED Final   Cryptosporidium NOT DETECTED NOT DETECTED Final   Cyclospora cayetanensis NOT DETECTED NOT DETECTED Final   Entamoeba histolytica NOT DETECTED NOT  DETECTED Final   Giardia lamblia NOT DETECTED NOT DETECTED Final   Adenovirus F40/41 NOT DETECTED NOT DETECTED Final   Astrovirus NOT DETECTED NOT DETECTED Final   Norovirus GI/GII NOT DETECTED NOT DETECTED Final   Rotavirus A NOT DETECTED NOT DETECTED Final   Sapovirus (I, II, IV, and V) NOT DETECTED NOT DETECTED Final    Comment: Performed at [facility], [place], [place] [address]  C Difficile Quick Screen w PCR reflex     Status: None   Collection Time: 10/14/20 11:00 AM   Specimen: STOOL  Result Value Ref Range Status   C Diff antigen NEGATIVE NEGATIVE Final   C Diff toxin NEGATIVE NEGATIVE Final   C Diff interpretation No C. difficile detected.  Final    Comment: Performed at [facility], [place] [address] [place], [place] [address]  MRSA PCR Screening     Status: None    Collection Time: 10/15/20 12:21 PM   Specimen: Nasal Mucosa; Nasopharyngeal  Result Value Ref Range Status   MRSA by PCR NEGATIVE NEGATIVE Final    Comment:        The GeneXpert MRSA Assay (FDA approved for NASAL specimens only), is one component of a comprehensive MRSA colonization surveillance program. It is not intended to diagnose MRSA infection nor to guide or monitor treatment for MRSA infections. Performed at [facility], [place] [address], [place], [place] [address]   Culture, blood (Routine X 2) w Reflex to ID Panel     Status: None   Collection Time: 10/20/20  4:40 PM   Specimen: BLOOD RIGHT HAND  Result Value Ref Range Status   Specimen Description BLOOD RIGHT HAND  Final   Special Requests   Final    BOTTLES DRAWN AEROBIC AND ANAEROBIC Blood Culture adequate volume   Culture   Final    NO GROWTH 5 DAYS Performed at [facility], [place] [address] [place], [place] [address]    Report Status 10/25/2020 FINAL  Final  Culture, blood (Routine X 2) w Reflex to ID Panel     Status: None   Collection Time: 10/20/20  4:43 PM   Specimen: BLOOD  Result Value Ref Range Status   Specimen Description BLOOD LEFT ANTECUBITAL  Final   Special Requests   Final    BOTTLES DRAWN AEROBIC ONLY Blood Culture results may not be optimal due to an inadequate volume of blood received in culture bottles   Culture   Final    NO GROWTH 5 DAYS Performed at [facility], [place] [address], [place], [place] [address]    Report Status 10/25/2020 FINAL  Final  MRSA PCR Screening     Status: None   Collection Time: 10/21/20 10:08 AM   Specimen: Nasal Mucosa; Nasopharyngeal  Result Value Ref Range Status   MRSA by PCR NEGATIVE NEGATIVE Final    Comment:        The GeneXpert MRSA Assay (FDA approved for NASAL specimens only), is one component of a comprehensive MRSA colonization surveillance program. It is not intended to diagnose MRSA infection nor to guide or monitor  treatment for MRSA infections. Performed at [facility], [place] [address] [place], [place] [address]     Coagulation Studies: No results for input(s): LABPROT, INR in the last 72 hours.  Urinalysis: No results for input(s): COLORURINE, LABSPEC, PHURINE, GLUCOSEU, HGBUR, BILIRUBINUR, KETONESUR, PROTEINUR, UROBILINOGEN, NITRITE, LEUKOCYTESUR in the last 72 hours.  Invalid input(s): APPERANCEUR    Imaging: No results found.   Medications:    . sodium chloride   Intravenous Once  . amiodarone  200 mg Oral BID   Followed by  . [START ON 12/08/2020] amiodarone  200 mg Oral Daily  . B-complex with vitamin C  1 tablet Oral Daily  . Chlorhexidine Gluconate Cloth  6 each Topical Q0600  . collagenase   Topical Daily  . darbepoetin (ARANESP) injection - DIALYSIS  200 mcg Intravenous Q Tue-HD  . docusate  100 mg Oral BID  . feeding supplement (NEPRO CARB STEADY)  237 mL Oral TID WC  . Gerhardt's butt cream   Topical TID  . lactobacillus acidophilus  2 tablet Oral TID  . lidocaine  1 patch Transdermal Q24H  . mouth rinse  15 mL Mouth Rinse BID  . midodrine  10 mg Oral TID WC  . sevelamer carbonate  0.8 g Oral TID WC  . sodium hypochlorite   Topical BID  . sucralfate  1 g Oral TID WC & HS  . triamcinolone 0.1 % cream : eucerin   Topical BID   acetaminophen, bisacodyl, calcium carbonate, chlorpheniramine-HYDROcodone, diphenhydrAMINE, loperamide, polyethylene glycol, prochlorperazine **OR** prochlorperazine **OR** prochlorperazine, Resource ThickenUp Clear, traZODone  Assessment/ Plan:  1. Acute kidney Injury now considered ESRD: Baseline creatinine 1.2 with severe ATN related to Covid and acute pancreatitis.  Now consider ATN.  Continuing dialysis per TTS schedule.  No signs of recovery at this time.  Planning for permanent access as an outpatient once his strength is improved.  -TTS schedule -Continue CLIP process  2.Acute pancreatitis:This was noted on CT scan earlier (1/3  and 1/7). 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  Now status post treatment  3. Acute hypoxic respiratory failure:Secondary to Covid pneumonia and possible aspiration pneumonitis. An improvement at this time  4. Anemia: Secondary to acute/critical illness and GI losses.Continue current dose of ESA.  IV iron prohibitive because of very high ferritin level.  Received PRBC.  Labs with dialysis tomorrow  5 Secondary hyperparathyroidism:Phosphorus level improved after starting sevelamer.  PTH level 217 at goal.  6 Hypotension: On midodrine.  Blood pressure acceptable.   LOS: 21 Reesa Chew @TODAY @2 :21 PM

## 2020-12-01 NOTE — Progress Notes (Signed)
[facility] PROGRESS NOTE   Subjective/Complaints: Patient seen sitting up in a chair this morning getting ready.  He states he slept well overnight.  He states his blood pressure was low this morning, but he feels okay.  He states his wife is still searching for hired caregiver support.  ROS: Denies CP, SOB, N/V/D  Objective:   No results found. Recent Labs    11/28/20 1601  WBC 10.3  HGB 7.0*  HCT 22.9*  PLT 210   Recent Labs    11/28/20 1601  NA 131*  K 3.7  CL 94*  CO2 20*  GLUCOSE 125*  BUN 71*  CREATININE 8.45*  CALCIUM 8.3*    Intake/Output Summary (Last 24 hours) at 12/01/2020 1042 Last data filed at 11/30/2020 1700 Gross per 24 hour  Intake --  Output 1737 ml  Net -1737 ml     Pressure Injury 11/10/20 Buttocks Right;Mid Unstageable - Full thickness tissue loss in which the base of the injury is covered by slough (yellow, tan, gray, green or brown) and/or eschar (tan, brown or black) in the wound bed. red open area of partial s (Active)  11/10/20 1721  Location: Buttocks  Location Orientation: Right;Mid  Staging: Unstageable - Full thickness tissue loss in which the base of the injury is covered by slough (yellow, tan, gray, green or brown) and/or eschar (tan, brown or black) in the wound bed.  Wound Description (Comments): red open area of partial skin loss with eschar covering wound bed  Present on Admission: Yes    Physical Exam: Vital Signs Blood pressure (!) 111/51, pulse 82, temperature 98.2 F (36.8 C), resp. rate 16, height 5\' 10"  (1.778 m), weight 75.8 kg, SpO2 94 %.  Constitutional: No distress . Vital signs reviewed. HENT: Normocephalic.  Atraumatic. Eyes: EOMI. No discharge. Cardiovascular: No JVD.  RRR. Respiratory: Normal effort.  No stridor.  Bilateral clear to auscultation. GI: Non-distended.  BS +. Skin: Warm and dry.   Psych: Normal mood.  Normal behavior. Musc: No edema in extremities.  No  tenderness in extremities. Neuro: Alert Motor: Bilateral upper extremities: 4+/5 throughout, stable Left lower extremity: Hip flexion, knee extension 4-/5, ankle dorsiflexion 4+/5, unchanged Right lower extremity: Hip flexion, knee extension 3 +-4-/5, ankle dorsiflexion 4+/5, unchanged  Assessment/Plan: 1. Functional deficits which require 3+ hours per day of interdisciplinary therapy in a comprehensive inpatient rehab setting.  Physiatrist is providing close team supervision and 24 hour management of active medical problems listed below.  Physiatrist and rehab team continue to assess barriers to discharge/monitor patient progress toward functional and medical goals  Care Tool:  Bathing    Body parts bathed by patient: Right arm,Left arm,Chest,Abdomen,Face,Front perineal area   Body parts bathed by helper: Buttocks,Right upper leg,Left upper leg,Right lower leg,Left lower leg     Bathing assist Assist Level: Maximal Assistance - Patient 24 - 49%     Upper Body Dressing/Undressing Upper body dressing   What is the patient wearing?: Pull over shirt,Button up shirt    Upper body assist Assist Level: Supervision/Verbal cueing    Lower Body Dressing/Undressing Lower body dressing      What is the patient wearing?: Pants,Incontinence brief     Lower body assist Assist for lower body dressing: Maximal Assistance - Patient 25 - 49%     Toileting Toileting    Toileting assist Assist for toileting: Maximal Assistance - Patient 25 - 49%     Transfers Chair/bed transfer  Transfers assist  Chair/bed transfer assist level: Contact Guard/Touching assist Chair/bed transfer assistive device: Walker,Armrests   Locomotion Ambulation   Ambulation assist   Ambulation activity did not occur: Safety/medical concerns  Assist level: Contact Guard/Touching assist Assistive device: Walker-rolling Max distance: 1ft   Walk 10 feet activity   Assist  Walk 10 feet  activity did not occur: Safety/medical concerns  Assist level: Contact Guard/Touching assist Assistive device: Walker-rolling   Walk 50 feet activity   Assist Walk 50 feet with 2 turns activity did not occur: Safety/medical concerns         Walk 150 feet activity   Assist Walk 150 feet activity did not occur: Safety/medical concerns         Walk 10 feet on uneven surface  activity   Assist Walk 10 feet on uneven surfaces activity did not occur: Safety/medical concerns         Wheelchair     Assist Will patient use wheelchair at discharge?: Yes Type of Wheelchair: Manual    Wheelchair assist level: Supervision/Verbal cueing Max wheelchair distance: 169ft    Wheelchair 50 feet with 2 turns activity    Assist        Assist Level: Supervision/Verbal cueing   Wheelchair 150 feet activity     Assist      Assist Level: Supervision/Verbal cueing     Medical Problem List and Plan: 1.  Generalized weakness affecting ability to stand or complete ADL tasks secondary to debility.  Continue CIR   Previously discussed with wife plans for discharge and goals of care 2.  Antithrombotics: -DVT/anticoagulation:  Mechanical: Sequential compression devices, below knee Bilateral lower extremities due to GI bleed.             -antiplatelet therapy: N/A 3. Pain Management: Tylenol prn.   Controlled with meds on 2/18 4. Mood: LCSW to follow for evaluation and support.              -antipsychotic agents: N/a 5. Neuropsych: This patient is capable of making decisions on his own behalf. 6. Skin/Wound Care: Routine pressure relief measures.   Discussed bedding and pressure relief for healing of sacral ulcer  Hydrotherapy 7. Fluids/Electrolytes/Nutrition: Strict I/Os.  Labs with HD.discussed 1267ml fluid restriction  8.  ESRD: On midodrine prior to HD to help support BP.   Permanent access as outpatient  Appreciate nephro recs 9. Chronic systolic CHF/CAF:  Strict I/Os. Fluid status managed with HD.              Daily weights  Lasix per Nephro, appreciate Recs  Amio per Cards recs  Filed Weights   11/29/20 0500 11/30/20 0500 12/01/20 0610  Weight: 78 kg 75.9 kg 75.8 kg   Stable on 2/18 10.  Pancreatitis: Has completed antibiotic course. Tolerating diet without nausea, diarrhea or abdominal pain.              Monitor closely.  Tolerating diet at present 11. LGIB: On Carafate ac/hs.  12. Acute on chronic anemia: On aranresp weekly.              Labs with HD, transfused on 2/5  Hemoglobin 7.0 on 2/15 13. Thrombocytopenia: Monitor for signs of bleeding.              Labs with HD  Resolved 14. Acute respiratory failure: Has resolved.   See #16 15.  Leukocytosis-some steroid effect  WBCs 10.3 on 2/15  Afebrile 16.  Steroid-induced hyperglycemia  SSI  Prednisone decreased to 20 on  2/4, decreased to 10 on 2/9, decreased to 5 on 2/15, DC'd on 2/19  Relatively controlled on 2/18, plan to DC CBG checks   17.  Dizziness  Likely due to anemia and soft blood pressures and limited p.o. intake in accordance with HD, patient not drinking nearly up to 1200cc fluid restriction  Trying to drink more, discussed again  Continue midodrine 18. Slow transit constipation:  Improving  LOS: 21 days A FACE TO FACE EVALUATION WAS PERFORMED  Keirra Zeimet Lorie Phenix 12/01/2020, 10:42 AM

## 2020-12-01 NOTE — Plan of Care (Signed)
  Problem: RH Dressing Goal: LTG Patient will perform lower body dressing w/assist (OT) Description: LTG: Patient will perform lower body dressing with assist, with/without cues in positioning using equipment (OT) Flowsheets (Taken 12/01/2020 0744) LTG: Pt will perform lower body dressing with assistance level of: (downgraded) Contact Guard/Touching assist Note: Downgraded due to fluctuations in endurance and balance   Problem: RH Toileting Goal: LTG Patient will perform toileting task (3/3 steps) with assistance level (OT) Description: LTG: Patient will perform toileting task (3/3 steps) with assistance level (OT)  Flowsheets (Taken 12/01/2020 0744) LTG: Pt will perform toileting task (3/3 steps) with assistance level: (downgraded) Minimal Assistance - Patient > 75% Note: Downgraded due to fluctuations in endurance and balance

## 2020-12-01 NOTE — Progress Notes (Signed)
Pt did not eat dinner tray, stated he felt nauseated, and that he threw up earlier but it was mainly "phlegm" due to that he could not get to the suction in time. Offered other options for pt to eat but he did not want anything. He has a nepro that he states "I will try to work on getting this shake down". Night nurse made aware.   Dayna Ramus, LPN

## 2020-12-01 NOTE — Progress Notes (Signed)
Physical Therapy Session Note  Patient Details  Name: Raymond Hurley. MRN: [id number] Date of Birth: [birth year]  Today's Date: 12/01/2020 PT Individual Time: 0712-1975 and 1445-1530  PT Individual Time Calculation (min): 55 min and 45 min  Short Term Goals: Week 2:  PT Short Term Goal 1 (Week 2): Pt will perform stand<>pivot transfer with LRAD and CGA PT Short Term Goal 1 - Progress (Week 2): Met PT Short Term Goal 2 (Week 2): Pt will ambulate 31f with LRAD and min A PT Short Term Goal 2 - Progress (Week 2): Met PT Short Term Goal 3 (Week 2): Pt will perform bed mobility with CGA consistantly PT Short Term Goal 3 - Progress (Week 2): Progressing toward goal Week 3:  PT Short Term Goal 1 (Week 3): STG=LTG due to LOS  Skilled Therapeutic Interventions/Progress Updates:   Treatment Session 1: [id number] min Received pt sitting in WC at sink with RN administering medications, pt with excess phlegm this morning requiring increased time to clear throat. Pt agreeable to therapy and denied any pain during session. Pt continues to require increased time with all mobility due to poor activity tolerance, dizziness, and SOB. Session with emphasis on functional mobility/transfers, generalized strengthening, dynamic standing balance/coordination, ambulation, and improved activity tolerance. [facility] purposes and ambulated 89fwith RW and CGA prior to requesting to sit due to weakness and dizziness. Pt stated "you wore me out". Pt performed BUE strengthening on UBE at level 2 for 2 minutes forwards and 2 minutes backwards with 1 rest break in between. Pt transferred sit<>stand with RW and min A and worked on dynamic standing balance tossing horseshoes x trial with CGA for balance. Returned to sitting and pt reported increased dizziness stating "i'm just not up to it today" but motivated to continue session. Pt requested to work on [employer]  performed seated BLE strengthening on Kinetron at 20 cm/sec for 1 minute x 2 trials without UE support with emphasis on glute/quad strengthening. [facility] Concluded session with pt sitting in WC, needs within reach, and seatbelt alarm on.  Treatment Session 2: 1445-1530 45 min Received pt semi-reclined in bed, pt agreeable to therapy, and denied any pain during session. Session with emphasis on functional mobility/transfers, generalized strengthening, dynamic standing balance/coordination, toileting, and improved activity tolerance. Pt agreeable to get OOB into WC and rolled to R with supervision and use of bedrails and transferred R sidelying<>sitting EOB with HOB elevated and use of bedrails with supervision. Pt reported dizziness and SOB and sat EOB for ~5 minutes for symptoms to resolve. Stand<>pivot bed<>WC with RW and min A to power up into standing and CGA to transfer. Pt performed the following exercises sitting in WC with supervision and verbal cues for technique: -hip flexion x10 bilaterally -LAQ x10 bilaterally Pt then reported urge to have BM and transferred WC<>toilet with bedside commode over top with RW and min A and required total A for clothing management. Pt required increased time to toilet but able to have small BM and required total A for peri-care in standing. Pt able to stand long enough for therapist to perform peri-care, pull brief/pants over hips, and [place] back to [place]Pt[place] arrived asking if pt will be able to ascend 1 curb to get in home. Informed wife that this therapist has discussed extensively with pt need for ramp for safety and energy conservation purposes  as pt is not able to navigate 1 step currently. Pt's wife frustrated stating "well what has he been doing here then?" Therapist explained pt's current mobility level and decreased strength, endurance, and increased dizziness with standing balance. Discussed what PT and OT will cover  on Monday with family education and pt's wife verbalized understanding. Concluded session with pt sitting in WC, needs within reach, and seatbelt alarm on. Wife present at bedside.   Therapy Documentation Precautions:  Precautions Precautions: Fall,Other (comment) Precaution Comments: Bowel incontinence (apparently had flexiseal removed yesterday) Restrictions Weight Bearing Restrictions: No  Therapy/Group: Individual Therapy Alfonse Alpers PT, DPT   12/01/2020, 7:38 AM

## 2020-12-01 NOTE — Progress Notes (Signed)
Physical Therapy Wound Treatment Patient Details  Name: Raymond Hurley. MRN: [id number] Date of Birth: [birth year]  Today's Date: 12/01/2020 PT Individual Time: 6286-3817 PT Individual Time Calculation (min): 29 min   Subjective  Subjective: Pleasant and agreeable to hydrotherapy Patient and Family Stated Goals: heal wound Date of Onset:  (unknown) Prior Treatments: dressing change  Pain Score:  Pt reports intermittent mild pain.  Wound Assessment  Pressure Injury 11/10/20 Buttocks Right;Mid Unstageable - Full thickness tissue loss in which the base of the injury is covered by slough (yellow, tan, gray, green or brown) and/or eschar (tan, brown or black) in the wound bed. red open area of partial s (Active)  Dressing Type Dakin's-soaked gauze;Moist to dry 12/01/20 1236  Dressing Changed;Clean;Dry;Intact 12/01/20 1236  Dressing Change Frequency Daily 12/01/20 1236  State of Healing Early/partial granulation 12/01/20 1236  Site / Wound Assessment Yellow;Pale;Pink 12/01/20 1236  % Wound base Red or Granulating 45% 12/01/20 1236  % Wound base Yellow/Fibrinous Exudate 55% 12/01/20 1236  % Wound base Black/Eschar 0% 12/01/20 1236  % Wound base Other/Granulation Tissue (Comment) 0% 12/01/20 1236  Peri-wound Assessment Intact;Erythema (blanchable) 12/01/20 1236  Wound Length (cm) 3.8 cm 11/29/20 1255  Wound Width (cm) 2.5 cm 11/29/20 1255  Wound Depth (cm) 0.5 cm 11/29/20 1255  Wound Surface Area (cm^2) 9.5 cm^2 11/29/20 1255  Wound Volume (cm^3) 4.75 cm^3 11/29/20 1255  Margins Unattached edges (unapproximated) 12/01/20 1236  Drainage Amount Minimal 12/01/20 1236  Drainage Description Green;No odor 12/01/20 1236  Treatment Debridement (Selective);Hydrotherapy (Pulse lavage);Packing (Dakin's soaked gauze) 12/01/20 1236   Hydrotherapy Pulsed lavage therapy - wound location: superior gluteal cleft Pulsed Lavage with Suction (psi): 12 psi Pulsed Lavage with Suction - Normal Saline  Used: 1000 mL Pulsed Lavage Tip: Tip with splash shield Selective Debridement Selective Debridement - Location: superior gluteal cleft Selective Debridement - Tools Used: Forceps;Scissors Selective Debridement - Tissue Removed: yellow slough   Wound Assessment and Plan  Wound Therapy - Assess/Plan/Recommendations Wound Therapy - Clinical Statement: Progressing with removal of unviable tissue. Dakin's initiated today. This patient will benefit from continued hydrotherapy for selective removal of unviable tissue, to decrease bioburden and promote wound bed healing.  Wound Therapy - Functional Problem List: decreased mobility Factors Delaying/Impairing Wound Healing: Immobility Hydrotherapy Plan: Debridement;Patient/family education;Pulsatile lavage with suction Wound Therapy - Frequency: 6X / week Wound Therapy - Follow Up Recommendations: Home health RN Wound Plan: see above  Wound Therapy Goals- Improve the function of patient's integumentary system by progressing the wound(s) through the phases of wound healing (inflammation - proliferation - remodeling) by: Decrease Necrotic Tissue to: 10 Decrease Necrotic Tissue - Progress: Progressing toward goal Increase Granulation Tissue to: 90 Increase Granulation Tissue - Progress: Progressing toward goal Goals/treatment plan/discharge plan were made with and agreed upon by patient/family: Yes Time For Goal Achievement: 7 days Wound Therapy - Potential for Goals: Good  Goals will be updated until maximal potential achieved or discharge criteria met.  Discharge criteria: when goals achieved, discharge from hospital, MD decision/surgical intervention, no progress towards goals, refusal/missing three consecutive treatments without notification or medical reason.  GP     Thelma Comp 12/01/2020, 12:41 PM  Rolinda Roan, PT, DPT Acute Rehabilitation Services Pager: [phone] Office: [phone]

## 2020-12-02 LAB — RENAL FUNCTION PANEL
Albumin: 2.5 g/dL — ABNORMAL LOW (ref 3.5–5.0)
Anion gap: 18 — ABNORMAL HIGH (ref 5–15)
BUN: 57 mg/dL — ABNORMAL HIGH (ref 8–23)
CO2: 21 mmol/L — ABNORMAL LOW (ref 22–32)
Calcium: 8.7 mg/dL — ABNORMAL LOW (ref 8.9–10.3)
Chloride: 97 mmol/L — ABNORMAL LOW (ref 98–111)
Creatinine, Ser: 7.99 mg/dL — ABNORMAL HIGH (ref 0.61–1.24)
GFR, Estimated: 6 mL/min — ABNORMAL LOW (ref >60)
Glucose, Bld: 107 mg/dL — ABNORMAL HIGH (ref 70–99)
Phosphorus: 2.4 mg/dL — ABNORMAL LOW (ref 2.5–4.6)
Potassium: 3.2 mmol/L — ABNORMAL LOW (ref 3.5–5.1)
Sodium: 136 mmol/L (ref 135–145)

## 2020-12-02 LAB — CBC
HCT: 24.9 % — ABNORMAL LOW (ref 39.0–52.0)
Hemoglobin: 7.5 g/dL — ABNORMAL LOW (ref 13.0–17.0)
MCH: 31.1 pg (ref 26.0–34.0)
MCHC: 30.1 g/dL (ref 30.0–36.0)
MCV: 103.3 fL — ABNORMAL HIGH (ref 80.0–100.0)
Platelets: 179 10*3/uL (ref 150–400)
RBC: 2.41 MIL/uL — ABNORMAL LOW (ref 4.22–5.81)
RDW: 26.3 % — ABNORMAL HIGH (ref 11.5–15.5)
WBC: 11 10*3/uL — ABNORMAL HIGH (ref 4.0–10.5)
nRBC: 0.4 % — ABNORMAL HIGH (ref 0.0–0.2)

## 2020-12-02 LAB — GLUCOSE, CAPILLARY
Glucose-Capillary: 91 mg/dL (ref 70–99)
Glucose-Capillary: 96 mg/dL (ref 70–99)
Glucose-Capillary: 117 mg/dL — ABNORMAL HIGH (ref 70–99)
Glucose-Capillary: 91 mg/dL (ref 70–99)

## 2020-12-02 MED ORDER — SODIUM CHLORIDE 0.9 % IV SOLN: 100.0000 mL | INTRAVENOUS | Status: DC | PRN

## 2020-12-02 MED ORDER — LIDOCAINE-PRILOCAINE 2.5-2.5 % EX CREA
1.0000 "application " | TOPICAL_CREAM | CUTANEOUS | Status: DC | PRN
  Filled 2020-12-02: qty 5

## 2020-12-02 MED ORDER — LIDOCAINE HCL (PF) 1 % IJ SOLN
5.0000 mL | INTRAMUSCULAR | Status: DC | PRN
  Filled 2020-12-02: qty 5

## 2020-12-02 MED ORDER — PENTAFLUOROPROP-TETRAFLUOROETH EX AERO: 1.0000 "application " | INHALATION_SPRAY | CUTANEOUS | Status: DC | PRN

## 2020-12-02 MED ORDER — HEPARIN SODIUM (PORCINE) 1000 UNIT/ML DIALYSIS
1000.0000 [IU] | INTRAMUSCULAR | Status: DC | PRN
  Administered 2020-12-02 (×2): 1000 [IU] via INTRAVENOUS_CENTRAL
  Filled 2020-12-02 (×2): qty 1

## 2020-12-02 MED ORDER — ALTEPLASE 2 MG IJ SOLR: 2.0000 mg | Freq: Once | INTRAMUSCULAR | Status: DC | PRN

## 2020-12-02 NOTE — Progress Notes (Signed)
Physical Therapy Session Note  Patient Details  Name: Raymond Hurley. MRN: [id number] Date of Birth: 09-Aug-1931  Today's Date: 12/02/2020 PT Individual Time: 1007-1051 PT Individual Time Calculation (min): 44 min   Short Term Goals: Week 3:  PT Short Term Goal 1 (Week 3): STG=LTG due to LOS  Skilled Therapeutic Interventions/Progress Updates:    Pt received sitting at sink in w/c finishing washing up from OT session. Pt agreeable to therapy session though reports he was "completely exhausted" last night reporting he was still "recovering from dialysis, therapy, and not eating." States that he was able to eat an omelet and piece of sausage this morning - therapist educated on importance of ensuring enough nutritional intake. Pt's wife, Raymond Hurley, called and reported there were people at their house today to build the ramp for home entry and that she is moving their bed out and she is purchasing a single bed so that pt's hospital bed will fit in the room with her. While pt finishing washing upper body at sink and donning clean shirt, noted to require more than a considerable amount of effort and time with pt easily becoming SOB and appearing to be experiencing more than an appropriate amount of fatigue. Pt reports too fatigued to do much standing but is agreeable to short distance ambulation to bed at end of session as he has to be in bed for hydro PT immediately after this session.  Transported to/from gym in w/c for time management and energy conservation. Seated B LE reciprocal movements on kinetron targeting functional strengthening against 20cm/sec resistance 2x 85minute - pt reporting feeling muscle fatigue with this exercise. Transported back to room in w/c. Sit>stand w/c>RW with CGA. Gait training ~67ft w/c>EOB using RW with CGA for steadying/safety - demos decreased speed of movement and increased trunk flexion. Sit>supine mod assist for B LE management into bed. Pt left supine in bed with needs  in reach and bed alarm on.   Therapy Documentation Precautions:  Precautions Precautions: Fall,Other (comment) Precaution Comments: Bowel incontinence (apparently had flexiseal removed yesterday) Restrictions Weight Bearing Restrictions: No  Pain:   No reports of pain throughout session.   Therapy/Group: Individual Therapy  Tawana Scale , PT, DPT, CSRS  12/02/2020, 7:54 AM

## 2020-12-02 NOTE — Progress Notes (Signed)
Physical Therapy Wound Treatment Patient Details  Name: Raymond Hurley. MRN: [id number] Date of Birth: 12/14/1930  Today's Date: 12/02/2020 PT Individual Time: 1129-1159 PT Individual Time Calculation (min): 30 min   Subjective  Subjective: Pleasant and agreeable to hydrotherapy Patient and Family Stated Goals: heal wound Date of Onset:  (unknown) Prior Treatments: dressing change  Pain Score:    Wound Assessment  Pressure Injury 11/10/20 Buttocks Right;Mid Unstageable - Full thickness tissue loss in which the base of the injury is covered by slough (yellow, tan, gray, green or brown) and/or eschar (tan, brown or black) in the wound bed. red open area of partial s (Active)  Dressing Type Dakin's-soaked gauze;Moist to moist 12/02/20 1300  Dressing Changed;Clean;Dry;Intact 12/02/20 1300  Dressing Change Frequency Daily 12/02/20 1300  State of Healing Early/partial granulation 12/02/20 1300  Site / Wound Assessment Pale;Pink;Yellow 12/02/20 1300  % Wound base Red or Granulating 50% 12/02/20 1300  % Wound base Yellow/Fibrinous Exudate 50% 12/02/20 1300  % Wound base Black/Eschar 0% 12/01/20 1236  % Wound base Other/Granulation Tissue (Comment) 0% 12/01/20 1236  Peri-wound Assessment Intact;Erythema (blanchable) 12/02/20 1300  Wound Length (cm) 3.8 cm 11/29/20 1255  Wound Width (cm) 2.5 cm 11/29/20 1255  Wound Depth (cm) 0.5 cm 11/29/20 1255  Wound Surface Area (cm^2) 9.5 cm^2 11/29/20 1255  Wound Volume (cm^3) 4.75 cm^3 11/29/20 1255  Margins Unattached edges (unapproximated) 12/02/20 1300  Drainage Amount Minimal 12/02/20 1300  Drainage Description Green;No odor 12/02/20 1300  Treatment Debridement (Selective);Hydrotherapy (Pulse lavage) 12/02/20 1300   Hydrotherapy Pulsed lavage therapy - wound location: superior gluteal cleft Pulsed Lavage with Suction (psi): 12 psi (8-12) Pulsed Lavage with Suction - Normal Saline Used: 1000 mL Pulsed Lavage Tip: Tip with splash  shield Selective Debridement Selective Debridement - Location: superior gluteal cleft Selective Debridement - Tools Used: Forceps;Scissors Selective Debridement - Tissue Removed: yellow slough   Wound Assessment and Plan  Wound Therapy - Assess/Plan/Recommendations Wound Therapy - Clinical Statement: Blue/green drainage noted on dressing. Dakin's solution ordered x 3 days and applied to dressing. This patient will benefit from continued hydrotherapy for debridement, and promote wound bed healing. Wound Therapy - Functional Problem List: decreased mobility Factors Delaying/Impairing Wound Healing: Immobility Hydrotherapy Plan: Debridement;Patient/family education;Pulsatile lavage with suction Wound Therapy - Frequency: 6X / week Wound Therapy - Follow Up Recommendations: Home health RN Wound Plan: see above  Wound Therapy Goals- Improve the function of patient's integumentary system by progressing the wound(s) through the phases of wound healing (inflammation - proliferation - remodeling) by: Decrease Necrotic Tissue to: 10 Decrease Necrotic Tissue - Progress: Progressing toward goal Increase Granulation Tissue to: 90 Increase Granulation Tissue - Progress: Progressing toward goal Goals/treatment plan/discharge plan were made with and agreed upon by patient/family: Yes Time For Goal Achievement: 7 days Wound Therapy - Potential for Goals: Good  Goals will be updated until maximal potential achieved or discharge criteria met.  Discharge criteria: when goals achieved, discharge from hospital, MD decision/surgical intervention, no progress towards goals, refusal/missing three consecutive treatments without notification or medical reason.  GP      Arby Barrette, PT Pager [phone]  Raymond Hurley 12/02/2020, 1:17 PM

## 2020-12-02 NOTE — Progress Notes (Signed)
[facility] PHYSICAL MEDICINE & REHABILITATION PROGRESS NOTE   Subjective/Complaints: Raymond Hurley has no complaints this morning. He just participated with OT and feels that he is improving.  Reading newspaper.  Has not been eating well  ROS: Denies CP, SOB, N/V/D, +poor appetite  Objective:   No results found. No results for input(s): WBC, HGB, HCT, PLT in the last 72 hours. No results for input(s): NA, K, CL, CO2, GLUCOSE, BUN, CREATININE, CALCIUM in the last 72 hours. No intake or output data in the 24 hours ending 12/02/20 1815   Pressure Injury 11/10/20 Buttocks Right;Mid Unstageable - Full thickness tissue loss in which the base of the injury is covered by slough (yellow, tan, gray, green or brown) and/or eschar (tan, brown or black) in the wound bed. red open area of partial s (Active)  11/10/20 1721  Location: Buttocks  Location Orientation: Right;Mid  Staging: Unstageable - Full thickness tissue loss in which the base of the injury is covered by slough (yellow, tan, gray, green or brown) and/or eschar (tan, brown or black) in the wound bed.  Wound Description (Comments): red open area of partial skin loss with eschar covering wound bed  Present on Admission: Yes    Physical Exam: Vital Signs Blood pressure (!) 111/58, pulse 87, temperature 99.3 F (37.4 C), resp. rate 20, height 5\' 10"  (1.778 m), weight 73.9 kg, SpO2 96 %.  Gen: no distress, normal appearing HEENT: oral mucosa pink and moist, NCAT Cardio: Reg rate Chest: normal effort, normal rate of breathing Abd: soft, non-distended Ext: no edema Psych: pleasant, normal affect Skin: Warm and dry.   Psych: Normal mood.  Normal behavior. Musc: No edema in extremities.  No tenderness in extremities. Neuro: Alert Motor: Bilateral upper extremities: 4+/5 throughout, stable Left lower extremity: Hip flexion, knee extension 4-/5, ankle dorsiflexion 4+/5, unchanged Right lower extremity: Hip flexion, knee extension 3  +-4-/5, ankle dorsiflexion 4+/5, unchanged  Assessment/Plan: 1. Functional deficits which require 3+ hours per day of interdisciplinary therapy in a comprehensive inpatient rehab setting.  Physiatrist is providing close team supervision and 24 hour management of active medical problems listed below.  Physiatrist and rehab team continue to assess barriers to discharge/monitor patient progress toward functional and medical goals  Care Tool:  Bathing    Body parts bathed by patient: Right arm,Left arm,Chest,Abdomen,Face,Front perineal area   Body parts bathed by helper: Buttocks     Bathing assist Assist Level: Minimal Assistance - Patient > 75%     Upper Body Dressing/Undressing Upper body dressing   What is the patient wearing?: Pull over shirt,Button up shirt    Upper body assist Assist Level: Supervision/Verbal cueing    Lower Body Dressing/Undressing Lower body dressing      What is the patient wearing?: Pants,Incontinence brief     Lower body assist Assist for lower body dressing: Moderate Assistance - Patient 50 - 74%     Toileting Toileting    Toileting assist Assist for toileting: Moderate Assistance - Patient 50 - 74%     Transfers Chair/bed transfer  Transfers assist     Chair/bed transfer assist level: Contact Guard/Touching assist Chair/bed transfer assistive device: Walker,Armrests   Locomotion Ambulation   Ambulation assist   Ambulation activity did not occur: Safety/medical concerns  Assist level: Contact Guard/Touching assist Assistive device: Walker-rolling Max distance: 52ft   Walk 10 feet activity   Assist  Walk 10 feet activity did not occur: Safety/medical concerns  Assist level: Contact Guard/Touching assist Assistive device:  Walker-rolling   Walk 50 feet activity   Assist Walk 50 feet with 2 turns activity did not occur: Safety/medical concerns         Walk 150 feet activity   Assist Walk 150 feet activity did  not occur: Safety/medical concerns         Walk 10 feet on uneven surface  activity   Assist Walk 10 feet on uneven surfaces activity did not occur: Safety/medical concerns         Wheelchair     Assist Will patient use wheelchair at discharge?: Yes Type of Wheelchair: Manual    Wheelchair assist level: Supervision/Verbal cueing Max wheelchair distance: 124ft    Wheelchair 50 feet with 2 turns activity    Assist        Assist Level: Supervision/Verbal cueing   Wheelchair 150 feet activity     Assist      Assist Level: Supervision/Verbal cueing     Medical Problem List and Plan: 1.  Generalized weakness affecting ability to stand or complete ADL tasks secondary to debility.  Continue CIR   Previously discussed with wife plans for discharge and goals of care 2.  Antithrombotics: -DVT/anticoagulation:  Mechanical: Sequential compression devices, below knee Bilateral lower extremities due to GI bleed.             -antiplatelet therapy: N/A 3. Pain Management: Tylenol prn.   Controlled with meds on 2/19 4. Mood: LCSW to follow for evaluation and support.              -antipsychotic agents: N/a 5. Neuropsych: This patient is capable of making decisions on his own behalf. 6. Skin/Wound Care: Routine pressure relief measures.   Discussed bedding and pressure relief for healing of sacral ulcer  Hydrotherapy 7. Fluids/Electrolytes/Nutrition: Strict I/Os.  Labs with HD.discussed 1288ml fluid restriction  8.  ESRD: On midodrine prior to HD to help support BP.   Permanent access as outpatient  Appreciate nephro recs 9. Chronic systolic CHF/CAF: Strict I/Os. Fluid status managed with HD.              Daily weights  Lasix per Nephro, appreciate Recs  Amio per Cards recs  Filed Weights   11/30/20 0500 12/01/20 0610 12/02/20 0537  Weight: 75.9 kg 75.8 kg 73.9 kg   Decreased 2/19- continue to monitor.  10.  Pancreatitis: Has completed antibiotic course.  Tolerating diet without nausea, diarrhea or abdominal pain.              Monitor closely.  Tolerating diet at present 11. LGIB: Continue Carafate ac/hs.  12. Acute on chronic anemia: On aranresp weekly.              Labs with HD, transfused on 2/5  Hemoglobin 7.0 on 2/15 13. Thrombocytopenia: Monitor for signs of bleeding.              Labs with HD  Resolved 14. Acute respiratory failure: Has resolved.   See #16 15.  Leukocytosis-some steroid effect  WBCs 10.3 on 2/15  Afebrile 16.  Steroid-induced hyperglycemia  SSI  Prednisone decreased to 20 on 2/4, decreased to 10 on 2/9, decreased to 5 on 2/15, DC'd on 2/19  Relatively controlled on 2/18, plan to DC CBG checks   17.  Dizziness  Likely due to anemia and soft blood pressures and limited p.o. intake in accordance with HD, patient not drinking nearly up to 1200cc fluid restriction  Trying to drink more, discussed again  Continue midodrine  18. Slow transit constipation:  Improving 19. Disposition: Discussed his concerns about discharge- he would like to return home with his wife and is trying to hire additional help at home but it is expensive for him. He has 6 children but unfortunately staying with them is not an option for him.   LOS: 22 days A FACE TO FACE EVALUATION WAS PERFORMED  Clide Deutscher Gavin Telford 12/02/2020, 6:15 PM

## 2020-12-02 NOTE — Progress Notes (Signed)
[facility] ROUNDING NOTE   Subjective:   Brief history: This is a 87 year old gentleman who remained anuric oliguric following Covid infection.  He is now CIR requiring intensive physical therapy.  His baseline creatinine is 1.2.  However he has been dialysis dependent since admission and is now on a Tuesday Thursday Saturday dialysis schedule.  There is no recorded urine output.  Patient undergoing CLIP.  No definitive chair yet.  Interval history: Patient continues to feel tired with some nausea. No significant changes  Objective:  Vital signs in last 24 hours:  Temp:  [97.6 F (36.4 C)-98.2 F (36.8 C)] 97.6 F (36.4 C) (02/19 0537) Pulse Rate:  [79-92] 79 (02/19 0537) Resp:  [16-18] 18 (02/19 0537) BP: (92-102)/(58-64) 100/64 (02/19 0537) SpO2:  [99 %-100 %] 100 % (02/19 0537) Weight:  [73.9 kg] 73.9 kg (02/19 0537)  Weight change: -1.814 kg Filed Weights   11/30/20 0500 12/01/20 0610 12/02/20 0537  Weight: 75.9 kg 75.8 kg 73.9 kg    Intake/Output: No intake/output data recorded.   Intake/Output this shift:  No intake/output data recorded.  General:  Sitting in bed, no distress Heart:RRR Lungs:  Bilateral chest rise with no increased work of breathing Abdomen:soft, Non-tender, non-distended Extremities: Trace ankle edema, stable Dialysis Access: Right IJ TDC placed by IR on 1/24.  Site clean   Basic Metabolic Panel: Recent Labs  Lab 11/25/20 1242 11/28/20 1601  NA 134* 131*  K 3.7 3.7  CL 96* 94*  CO2 20* 20*  GLUCOSE 127* 125*  BUN 49* 71*  CREATININE 6.53* 8.45*  CALCIUM 8.5* 8.3*  PHOS 3.2 2.8    Liver Function Tests: Recent Labs  Lab 11/25/20 1242 11/28/20 1601  ALBUMIN 2.8* 2.5*   No results for input(s): LIPASE, AMYLASE in the last 168 hours. No results for input(s): AMMONIA in the last 168 hours.  CBC: Recent Labs  Lab 11/25/20 1242 11/28/20 1601  WBC 11.1* 10.3  HGB 7.6* 7.0*  HCT 25.2* 22.9*  MCV 99.6 100.0  PLT  268 210    Cardiac Enzymes: No results for input(s): CKTOTAL, CKMB, CKMBINDEX, TROPONINI in the last 168 hours.  BNP: Invalid input(s): POCBNP  CBG: Recent Labs  Lab 12/01/20 0608 12/01/20 1141 12/01/20 1648 12/01/20 2124 12/02/20 0611  GLUCAP 100* 120* 113* 114* 91    Microbiology: Results for orders placed or performed during the hospital encounter of 10/14/20  Resp Panel by RT-PCR (Flu A&B, Covid) Nasopharyngeal Swab     Status: Abnormal   Collection Time: 10/14/20  2:21 AM   Specimen: Nasopharyngeal Swab; Nasopharyngeal(NP) swabs in vial transport medium  Result Value Ref Range Status   SARS Coronavirus 2 by RT PCR POSITIVE (A) NEGATIVE Final    Comment: RESULT CALLED TO, READ BACK BY AND VERIFIED WITH: G CERER RN 10/14/20 0349 JDW (NOTE) SARS-CoV-2 target nucleic acids are DETECTED.  The SARS-CoV-2 RNA is generally detectable in upper respiratory specimens during the acute phase of infection. Positive results are indicative of the presence of the identified virus, but do not rule out bacterial infection or co-infection with other pathogens not detected by the test. Clinical correlation with patient history and other diagnostic information is necessary to determine patient infection status. The expected result is Negative.  Fact Sheet for Patients: EntrepreneurPulse.com.au  Fact Sheet for Healthcare Providers: IncredibleEmployment.be  This test is not yet approved or cleared by the [place] FDA and  has been authorized for detection and/or diagnosis of SARS-CoV-2 by FDA under an  Emergency Use Authorization (EUA).  This EUA will remain in effect (meaning this test can be used)  for the duration of  the COVID-19 declaration under Section 564(b)(1) of the Act, 21 U.S.C. section 360bbb-3(b)(1), unless the authorization is terminated or revoked sooner.     Influenza A by PCR NEGATIVE NEGATIVE Final   Influenza B by PCR  NEGATIVE NEGATIVE Final    Comment: (NOTE) The Xpert Xpress SARS-CoV-2/FLU/RSV plus assay is intended as an aid in the diagnosis of influenza from Nasopharyngeal swab specimens and should not be used as a sole basis for treatment. Nasal washings and aspirates are unacceptable for Xpert Xpress SARS-CoV-2/FLU/RSV testing.  Fact Sheet for Patients: EntrepreneurPulse.com.au  Fact Sheet for Healthcare Providers: IncredibleEmployment.be  This test is not yet approved or cleared by the [place] FDA and has been authorized for detection and/or diagnosis of SARS-CoV-2 by FDA under an Emergency Use Authorization (EUA). This EUA will remain in effect (meaning this test can be used) for the duration of the COVID-19 declaration under Section 564(b)(1) of the Act, 21 U.S.C. section 360bbb-3(b)(1), unless the authorization is terminated or revoked.  Performed at [facility], [place] [address], [place], [place] [address]   Culture, blood (routine x 2)     Status: None   Collection Time: 10/14/20 10:36 AM   Specimen: BLOOD RIGHT HAND  Result Value Ref Range Status   Specimen Description BLOOD RIGHT HAND  Final   Special Requests   Final    BOTTLES DRAWN AEROBIC AND ANAEROBIC Blood Culture adequate volume   Culture   Final    NO GROWTH 5 DAYS Performed at [facility], [place] [address] [place], [place] [address]    Report Status 10/19/2020 FINAL  Final  Culture, blood (routine x 2)     Status: None   Collection Time: 10/14/20 10:36 AM   Specimen: BLOOD LEFT HAND  Result Value Ref Range Status   Specimen Description BLOOD LEFT HAND  Final   Special Requests   Final    BOTTLES DRAWN AEROBIC AND ANAEROBIC Blood Culture results may not be optimal due to an inadequate volume of blood received in culture bottles   Culture   Final    NO GROWTH 5 DAYS Performed at [facility], [place] [address] [place], [place] [address]    Report Status  10/19/2020 FINAL  Final  Gastrointestinal Panel by PCR , Stool     Status: None   Collection Time: 10/14/20 11:00 AM   Specimen: Stool  Result Value Ref Range Status   Campylobacter species NOT DETECTED NOT DETECTED Final   Plesimonas shigelloides NOT DETECTED NOT DETECTED Final   Salmonella species NOT DETECTED NOT DETECTED Final   Yersinia enterocolitica NOT DETECTED NOT DETECTED Final   Vibrio species NOT DETECTED NOT DETECTED Final   Vibrio cholerae NOT DETECTED NOT DETECTED Final   Enteroaggregative E coli (EAEC) NOT DETECTED NOT DETECTED Final   Enteropathogenic E coli (EPEC) NOT DETECTED NOT DETECTED Final   Enterotoxigenic E coli (ETEC) NOT DETECTED NOT DETECTED Final   Shiga like toxin producing E coli (STEC) NOT DETECTED NOT DETECTED Final   Shigella/Enteroinvasive E coli (EIEC) NOT DETECTED NOT DETECTED Final   Cryptosporidium NOT DETECTED NOT DETECTED Final   Cyclospora cayetanensis NOT DETECTED NOT DETECTED Final   Entamoeba histolytica NOT DETECTED NOT DETECTED Final   Giardia lamblia NOT DETECTED NOT DETECTED Final   Adenovirus F40/41 NOT DETECTED NOT DETECTED Final   Astrovirus  NOT DETECTED NOT DETECTED Final   Norovirus GI/GII NOT DETECTED NOT DETECTED Final   Rotavirus A NOT DETECTED NOT DETECTED Final   Sapovirus (I, II, IV, and V) NOT DETECTED NOT DETECTED Final    Comment: Performed at [facility], [place], [place] [address]  C Difficile Quick Screen w PCR reflex     Status: None   Collection Time: 10/14/20 11:00 AM   Specimen: STOOL  Result Value Ref Range Status   C Diff antigen NEGATIVE NEGATIVE Final   C Diff toxin NEGATIVE NEGATIVE Final   C Diff interpretation No C. difficile detected.  Final    Comment: Performed at [facility], [place] [address] [place], [place] [address]  MRSA PCR Screening     Status: None   Collection Time: 10/15/20 12:21 PM   Specimen: Nasal Mucosa; Nasopharyngeal  Result Value Ref Range Status    MRSA by PCR NEGATIVE NEGATIVE Final    Comment:        The GeneXpert MRSA Assay (FDA approved for NASAL specimens only), is one component of a comprehensive MRSA colonization surveillance program. It is not intended to diagnose MRSA infection nor to guide or monitor treatment for MRSA infections. Performed at [facility], [place] [address], [place], [place] [address]   Culture, blood (Routine X 2) w Reflex to ID Panel     Status: None   Collection Time: 10/20/20  4:40 PM   Specimen: BLOOD RIGHT HAND  Result Value Ref Range Status   Specimen Description BLOOD RIGHT HAND  Final   Special Requests   Final    BOTTLES DRAWN AEROBIC AND ANAEROBIC Blood Culture adequate volume   Culture   Final    NO GROWTH 5 DAYS Performed at [facility], [place] [address] [place], [place] [address]    Report Status 10/25/2020 FINAL  Final  Culture, blood (Routine X 2) w Reflex to ID Panel     Status: None   Collection Time: 10/20/20  4:43 PM   Specimen: BLOOD  Result Value Ref Range Status   Specimen Description BLOOD LEFT ANTECUBITAL  Final   Special Requests   Final    BOTTLES DRAWN AEROBIC ONLY Blood Culture results may not be optimal due to an inadequate volume of blood received in culture bottles   Culture   Final    NO GROWTH 5 DAYS Performed at [facility], [place] [address] [place], [place] [address]    Report Status 10/25/2020 FINAL  Final  MRSA PCR Screening     Status: None   Collection Time: 10/21/20 10:08 AM   Specimen: Nasal Mucosa; Nasopharyngeal  Result Value Ref Range Status   MRSA by PCR NEGATIVE NEGATIVE Final    Comment:        The GeneXpert MRSA Assay (FDA approved for NASAL specimens only), is one component of a comprehensive MRSA colonization surveillance program. It is not intended to diagnose MRSA infection nor to guide or monitor treatment for MRSA infections. Performed at [facility], [place] [address], [place],  [address]      Coagulation Studies: No results for input(s): LABPROT, INR in the last 72 hours.  Urinalysis: No results for input(s): COLORURINE, LABSPEC, PHURINE, GLUCOSEU, HGBUR, BILIRUBINUR, KETONESUR, PROTEINUR, UROBILINOGEN, NITRITE, LEUKOCYTESUR in the last 72 hours.  Invalid input(s): APPERANCEUR    Imaging: No results found.   Medications:    . sodium chloride   Intravenous Once  .  amiodarone  200 mg Oral BID   Followed by  . [START ON 12/08/2020] amiodarone  200 mg Oral Daily  . B-complex with vitamin C  1 tablet Oral Daily  . Chlorhexidine Gluconate Cloth  6 each Topical Q0600  . collagenase   Topical Daily  . darbepoetin (ARANESP) injection - DIALYSIS  200 mcg Intravenous Q Tue-HD  . docusate  100 mg Oral BID  . feeding supplement (NEPRO CARB STEADY)  237 mL Oral TID WC  . Gerhardt's butt cream   Topical TID  . lactobacillus acidophilus & bulgar  1 tablet Oral TID WC  . lidocaine  1 patch Transdermal Q24H  . mouth rinse  15 mL Mouth Rinse BID  . midodrine  10 mg Oral TID WC  . sevelamer carbonate  0.8 g Oral TID WC  . sodium hypochlorite   Topical BID  . sucralfate  1 g Oral TID WC & HS  . triamcinolone 0.1 % cream : eucerin   Topical BID   acetaminophen, bisacodyl, calcium carbonate, chlorpheniramine-HYDROcodone, diphenhydrAMINE, loperamide, polyethylene glycol, prochlorperazine **OR** prochlorperazine **OR** prochlorperazine, Resource ThickenUp Clear, traZODone  Assessment/ Plan:  1. Acute kidney Injury now considered ESRD: Baseline creatinine 1.2 with severe ATN related to Covid and acute pancreatitis.  Continuing dialysis per TTS schedule.  No signs of recovery at this time.  Planning for permanent access as an outpatient once his strength is improved.  -TTS schedule -Continue CLIP process  2.Acute pancreatitis:This was noted on CT scan earlier (1/3 and 1/7). 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  Now status post treatment  3. Acute hypoxic respiratory failure:Secondary to Covid pneumonia and possible aspiration pneumonitis. An improvement at this time  4. Anemia: Secondary to acute/critical illness and GI losses.Continue current dose of ESA.  IV iron prohibitive because of very high ferritin level.  Received PRBC.  Labs with dialysis tomorrow  5 Secondary hyperparathyroidism:Phosphorus level improved after starting sevelamer.  PTH level 217 at goal.  6 Hypotension: On midodrine.  Blood pressure acceptable.   LOS: [place] @TODAY @11 :23 AM

## 2020-12-02 NOTE — Progress Notes (Signed)
Occupational Therapy Session Note  Patient Details  Name: Raymond Hurley. MRN: [id number] Date of Birth: 09-27-1931  Today's Date: 12/02/2020 OT Individual Time: 4142-3953 OT Individual Time Calculation (min): 47 min    Short Term Goals: Week 4:  OT Short Term Goal 1 (Week 4): STG = LTGs due to remaining LOS  Skilled Therapeutic Interventions/Progress Updates:    Treatment session with focus on self-care retraining, functional transfers, and dynamic standing balance.  Pt received semi-reclined in bed agreeable to therapy session.  Pt completed bed mobility with reliance on bed rails to come to EOB with supervision.  Pt reports dizziness upon sitting EOB requiring ~2 mins to recuperate.  Pt completed sit > stand from slightly elevated EOB with RW and min assist.  Pt reports need to toilet, therefore completed stand pivot transfer bed > w/c > BSC over toilet with RW with CGA once upright.  Pt able to doff pants in standing with alternating UE support on RW.  Pt able to stand post BM this session with CGA.  Pt did require assistance for hygiene post BM due to fatigue and reports of oncoming dizziness.  Pt required seated rest break before pulling pants up.  Pt completed additional sit > stand with CGA from [facility] and was able to pull R side of pants over hips while therapist assisted with L side.  While on BSC, pt doffed dirty pants with use of reacher and increased time.  Pt donned clean pants with mod assist due to gripper socks.  Therapist educated pt to don pants without gripper socks on to decrease friction but then don socks prior to standing for safety.  Pt has demonstrated ability to utilize reacher and sock aid with setup assist and cues for sequencing.  Pt does continue to require assist to don TEDS.  Pt remained upright in w/c at sink to complete UB bathing while awaiting PT arrival.  RN aware of pt positioning.   Therapy Documentation Precautions:  Precautions Precautions: Fall,Other  (comment) Precaution Comments: Bowel incontinence (apparently had flexiseal removed yesterday) Restrictions Weight Bearing Restrictions: No Pain:  Pt with no c/o pain   Therapy/Group: Individual Therapy  [name] 12/02/2020, 10:02 AM

## 2020-12-03 LAB — GLUCOSE, CAPILLARY
Glucose-Capillary: 89 mg/dL (ref 70–99)
Glucose-Capillary: 101 mg/dL — ABNORMAL HIGH (ref 70–99)
Glucose-Capillary: 124 mg/dL — ABNORMAL HIGH (ref 70–99)
Glucose-Capillary: 126 mg/dL — ABNORMAL HIGH (ref 70–99)

## 2020-12-03 MED ORDER — MEGESTROL ACETATE 40 MG PO TABS
40.0000 mg | ORAL_TABLET | Freq: Every day | ORAL | Status: DC
  Administered 2020-12-03 – 2020-12-07 (×5): 40 mg via ORAL
  Filled 2020-12-03 (×5): qty 1

## 2020-12-03 NOTE — Progress Notes (Signed)
[facility] ROUNDING NOTE   Subjective:   Brief history: This is a 87 year old gentleman who remained anuric oliguric following Covid infection.  He is now CIR requiring intensive physical therapy.  His baseline creatinine is 1.2.  However he has been dialysis dependent since admission and is now on a Tuesday Thursday Saturday dialysis schedule.  There is no recorded urine output.  Patient undergoing CLIP.  No definitive chair yet.  Interval history: Patient states he feels very tired today.  Numerous questions regarding dialysis.  All were answered.  No other complaints  Objective:  Vital signs in last 24 hours:  Temp:  [97.6 F (36.4 C)-97.8 F (36.6 C)] 97.7 F (36.5 C) (02/20 0510) Pulse Rate:  [81-95] 95 (02/20 0510) Resp:  [16-20] 20 (02/20 0510) BP: (90-111)/(43-64) 94/51 (02/20 0510) SpO2:  [90 %-96 %] 90 % (02/20 0510) Weight:  [72.1 kg] 72.1 kg (02/20 0510)  Weight change: -1.814 kg Filed Weights   12/01/20 0610 12/02/20 0537 12/03/20 0510  Weight: 75.8 kg 73.9 kg 72.1 kg    Intake/Output: I/O last 3 completed shifts: In: -  Out: 600 [Other:600]   Intake/Output this shift:  No intake/output data recorded.  General:  Sitting in bed, no distress Heart:RRR Lungs:  Bilateral chest rise with no increased work of breathing Abdomen:soft, Non-tender, non-distended Extremities: Trace ankle edema, stable Dialysis Access: Right IJ TDC placed by IR on 1/24.  Site clean   Basic Metabolic Panel: Recent Labs  Lab 11/28/20 1601 12/02/20 1854  NA 131* 136  K 3.7 3.2*  CL 94* 97*  CO2 20* 21*  GLUCOSE 125* 107*  BUN 71* 57*  CREATININE 8.45* 7.99*  CALCIUM 8.3* 8.7*  PHOS 2.8 2.4*    Liver Function Tests: Recent Labs  Lab 11/28/20 1601 12/02/20 1854  ALBUMIN 2.5* 2.5*   No results for input(s): LIPASE, AMYLASE in the last 168 hours. No results for input(s): AMMONIA in the last 168 hours.  CBC: Recent Labs  Lab 11/28/20 1601 12/02/20 1853   WBC 10.3 11.0*  HGB 7.0* 7.5*  HCT 22.9* 24.9*  MCV 100.0 103.3*  PLT 210 179    Cardiac Enzymes: No results for input(s): CKTOTAL, CKMB, CKMBINDEX, TROPONINI in the last 168 hours.  BNP: Invalid input(s): POCBNP  CBG: Recent Labs  Lab 12/02/20 0611 12/02/20 1218 12/02/20 1635 12/02/20 2222 12/03/20 0636  GLUCAP 91 96 117* 91 89    Microbiology: Results for orders placed or performed during the hospital encounter of 10/14/20  Resp Panel by RT-PCR (Flu A&B, Covid) Nasopharyngeal Swab     Status: Abnormal   Collection Time: 10/14/20  2:21 AM   Specimen: Nasopharyngeal Swab; Nasopharyngeal(NP) swabs in vial transport medium  Result Value Ref Range Status   SARS Coronavirus 2 by RT PCR POSITIVE (A) NEGATIVE Final    Comment: RESULT CALLED TO, READ BACK BY AND VERIFIED WITH: G CERER RN 10/14/20 0349 JDW (NOTE) SARS-CoV-2 target nucleic acids are DETECTED.  The SARS-CoV-2 RNA is generally detectable in upper respiratory specimens during the acute phase of infection. Positive results are indicative of the presence of the identified virus, but do not rule out bacterial infection or co-infection with other pathogens not detected by the test. Clinical correlation with patient history and other diagnostic information is necessary to determine patient infection status. The expected result is Negative.  Fact Sheet for Patients: EntrepreneurPulse.com.au  Fact Sheet for Healthcare Providers: IncredibleEmployment.be  This test is not yet approved or cleared by the [place]  FDA and  has been authorized for detection and/or diagnosis of SARS-CoV-2 by FDA under an Emergency Use Authorization (EUA).  This EUA will remain in effect (meaning this test can be used)  for the duration of  the COVID-19 declaration under Section 564(b)(1) of the Act, 21 U.S.C. section 360bbb-3(b)(1), unless the authorization is terminated or revoked sooner.      Influenza A by PCR NEGATIVE NEGATIVE Final   Influenza B by PCR NEGATIVE NEGATIVE Final    Comment: (NOTE) The Xpert Xpress SARS-CoV-2/FLU/RSV plus assay is intended as an aid in the diagnosis of influenza from Nasopharyngeal swab specimens and should not be used as a sole basis for treatment. Nasal washings and aspirates are unacceptable for Xpert Xpress SARS-CoV-2/FLU/RSV testing.  Fact Sheet for Patients: EntrepreneurPulse.com.au  Fact Sheet for Healthcare Providers: IncredibleEmployment.be  This test is not yet approved or cleared by the [place] FDA and has been authorized for detection and/or diagnosis of SARS-CoV-2 by FDA under an Emergency Use Authorization (EUA). This EUA will remain in effect (meaning this test can be used) for the duration of the COVID-19 declaration under Section 564(b)(1) of the Act, 21 U.S.C. section 360bbb-3(b)(1), unless the authorization is terminated or revoked.  Performed at [facility], [place] [address] [place], [place] [address]   Culture, blood (routine x 2)     Status: None   Collection Time: 10/14/20 10:36 AM   Specimen: BLOOD RIGHT HAND  Result Value Ref Range Status   Specimen Description BLOOD RIGHT HAND  Final   Special Requests   Final    BOTTLES DRAWN AEROBIC AND ANAEROBIC Blood Culture adequate volume   Culture   Final    NO GROWTH 5 DAYS Performed at [facility], [place] [address], [place], [place] [address]    Report Status 10/19/2020 FINAL  Final  Culture, blood (routine x 2)     Status: None   Collection Time: 10/14/20 10:36 AM   Specimen: BLOOD LEFT HAND  Result Value Ref Range Status   Specimen Description BLOOD LEFT HAND  Final   Special Requests   Final    BOTTLES DRAWN AEROBIC AND ANAEROBIC Blood Culture results may not be optimal due to an inadequate volume of blood received in culture bottles   Culture   Final    NO GROWTH 5 DAYS Performed at [facility], [place] [address] [place], [place] [address]    Report Status 10/19/2020 FINAL  Final  Gastrointestinal Panel by PCR , Stool     Status: None   Collection Time: 10/14/20 11:00 AM   Specimen: Stool  Result Value Ref Range Status   Campylobacter species NOT DETECTED NOT DETECTED Final   Plesimonas shigelloides NOT DETECTED NOT DETECTED Final   Salmonella species NOT DETECTED NOT DETECTED Final   Yersinia enterocolitica NOT DETECTED NOT DETECTED Final   Vibrio species NOT DETECTED NOT DETECTED Final   Vibrio cholerae NOT DETECTED NOT DETECTED Final   Enteroaggregative E coli (EAEC) NOT DETECTED NOT DETECTED Final   Enteropathogenic E coli (EPEC) NOT DETECTED NOT DETECTED Final   Enterotoxigenic E coli (ETEC) NOT DETECTED NOT DETECTED Final   Shiga like toxin producing E coli (STEC) NOT DETECTED NOT DETECTED Final   Shigella/Enteroinvasive E coli (EIEC) NOT DETECTED NOT DETECTED Final   Cryptosporidium NOT DETECTED NOT DETECTED Final   Cyclospora cayetanensis NOT DETECTED NOT DETECTED Final   Entamoeba histolytica NOT DETECTED NOT DETECTED Final   Giardia lamblia NOT  DETECTED NOT DETECTED Final   Adenovirus F40/41 NOT DETECTED NOT DETECTED Final   Astrovirus NOT DETECTED NOT DETECTED Final   Norovirus GI/GII NOT DETECTED NOT DETECTED Final   Rotavirus A NOT DETECTED NOT DETECTED Final   Sapovirus (I, II, IV, and V) NOT DETECTED NOT DETECTED Final    Comment: Performed at [facility], [place], [place] [address]  C Difficile Quick Screen w PCR reflex     Status: None   Collection Time: 10/14/20 11:00 AM   Specimen: STOOL  Result Value Ref Range Status   C Diff antigen NEGATIVE NEGATIVE Final   C Diff toxin NEGATIVE NEGATIVE Final   C Diff interpretation No C. difficile detected.  Final    Comment: Performed at [facility], [place] [address], [place], [place] [address]  MRSA PCR Screening     Status: None   Collection Time: 10/15/20 12:21 PM   Specimen:  Nasal Mucosa; Nasopharyngeal  Result Value Ref Range Status   MRSA by PCR NEGATIVE NEGATIVE Final    Comment:        The GeneXpert MRSA Assay (FDA approved for NASAL specimens only), is one component of a comprehensive MRSA colonization surveillance program. It is not intended to diagnose MRSA infection nor to guide or monitor treatment for MRSA infections. Performed at [facility], [place] [address], [place], [place] [address]   Culture, blood (Routine X 2) w Reflex to ID Panel     Status: None   Collection Time: 10/20/20  4:40 PM   Specimen: BLOOD RIGHT HAND  Result Value Ref Range Status   Specimen Description BLOOD RIGHT HAND  Final   Special Requests   Final    BOTTLES DRAWN AEROBIC AND ANAEROBIC Blood Culture adequate volume   Culture   Final    NO GROWTH 5 DAYS Performed at [facility], [place] [address] [place], [place] [address]    Report Status 10/25/2020 FINAL  Final  Culture, blood (Routine X 2) w Reflex to ID Panel     Status: None   Collection Time: 10/20/20  4:43 PM   Specimen: BLOOD  Result Value Ref Range Status   Specimen Description BLOOD LEFT ANTECUBITAL  Final   Special Requests   Final    BOTTLES DRAWN AEROBIC ONLY Blood Culture results may not be optimal due to an inadequate volume of blood received in culture bottles   Culture   Final    NO GROWTH 5 DAYS Performed at [facility], [place] [address] [place], [place] [address]    Report Status 10/25/2020 FINAL  Final  MRSA PCR Screening     Status: None   Collection Time: 10/21/20 10:08 AM   Specimen: Nasal Mucosa; Nasopharyngeal  Result Value Ref Range Status   MRSA by PCR NEGATIVE NEGATIVE Final    Comment:        The GeneXpert MRSA Assay (FDA approved for NASAL specimens only), is one component of a comprehensive MRSA colonization surveillance program. It is not intended to diagnose MRSA infection nor to guide or monitor treatment for MRSA infections. Performed at [facility], [place] [address] [place], [place] [address]     Coagulation Studies: No results for input(s): LABPROT, INR in the last 72 hours.  Urinalysis: No results for input(s): COLORURINE, LABSPEC, PHURINE, GLUCOSEU, HGBUR, BILIRUBINUR, KETONESUR, PROTEINUR, UROBILINOGEN, NITRITE, LEUKOCYTESUR in the last 72 hours.  Invalid input(s): APPERANCEUR    Imaging:  No results found.   Medications:    . sodium chloride   Intravenous Once  . amiodarone  200 mg Oral BID   Followed by  . [START ON 12/08/2020] amiodarone  200 mg Oral Daily  . B-complex with vitamin C  1 tablet Oral Daily  . Chlorhexidine Gluconate Cloth  6 each Topical Q0600  . collagenase   Topical Daily  . darbepoetin (ARANESP) injection - DIALYSIS  200 mcg Intravenous Q Tue-HD  . docusate  100 mg Oral BID  . feeding supplement (NEPRO CARB STEADY)  237 mL Oral TID WC  . Gerhardt's butt cream   Topical TID  . lactobacillus acidophilus & bulgar  1 tablet Oral TID WC  . lidocaine  1 patch Transdermal Q24H  . mouth rinse  15 mL Mouth Rinse BID  . megestrol  40 mg Oral Daily  . midodrine  10 mg Oral TID WC  . sevelamer carbonate  0.8 g Oral TID WC  . sucralfate  1 g Oral TID WC & HS  . triamcinolone 0.1 % cream : eucerin   Topical BID   acetaminophen, bisacodyl, calcium carbonate, chlorpheniramine-HYDROcodone, diphenhydrAMINE, loperamide, polyethylene glycol, prochlorperazine **OR** prochlorperazine **OR** prochlorperazine, Resource ThickenUp Clear, traZODone  Assessment/ Plan:  1. Acute kidney Injury now considered ESRD: Baseline creatinine 1.2 with severe ATN related to Covid and acute pancreatitis.  Continuing dialysis per TTS schedule.  No signs of recovery at this time.  Planning for permanent access as an outpatient once his strength is improved.  -TTS schedule -Continue CLIP process  2.Acute pancreatitis:This was noted on CT scan earlier (1/3 and 1/7). 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  Now status post treatment  3. Acute hypoxic respiratory failure:Secondary to Covid pneumonia and possible aspiration pneumonitis. An improvement at this time  4. Anemia: Secondary to acute/critical illness and GI losses.Continue current dose of ESA.  IV iron prohibitive because of very high ferritin level.  Received PRBC.  Labs with dialysis tomorrow  5 Secondary hyperparathyroidism:Phosphorus level improved after starting sevelamer.  PTH level 217 at goal.  6 Hypotension: On midodrine.  Blood pressure acceptable.   LOS: 23 Delmos Velaquez J Juliyah Mergen @TODAY @11 :23 AM

## 2020-12-03 NOTE — Progress Notes (Signed)
[facility] PHYSICAL MEDICINE & REHABILITATION PROGRESS NOTE   Subjective/Complaints: Continues to have poor appetite.  He and wife continued to be stressed about discharge- hiring aide is too expensive, they want him to be stronger before he is discharge, he finds that dialysis makes him very weak and would like to decrease frequency, I will message nephrology  ROS: Denies CP, SOB, N/V/D, +poor appetite, +soreness of pressure injury  Objective:   No results found. Recent Labs    12/02/20 1853  WBC 11.0*  HGB 7.5*  HCT 24.9*  PLT 179   Recent Labs    12/02/20 1854  NA 136  K 3.2*  CL 97*  CO2 21*  GLUCOSE 107*  BUN 57*  CREATININE 7.99*  CALCIUM 8.7*    Intake/Output Summary (Last 24 hours) at 12/03/2020 0855 Last data filed at 12/02/2020 2140 Gross per 24 hour  Intake -  Output 600 ml  Net -600 ml     Pressure Injury 11/10/20 Buttocks Right;Mid Unstageable - Full thickness tissue loss in which the base of the injury is covered by slough (yellow, tan, gray, green or brown) and/or eschar (tan, brown or black) in the wound bed. red open area of partial s (Active)  11/10/20 1721  Location: Buttocks  Location Orientation: Right;Mid  Staging: Unstageable - Full thickness tissue loss in which the base of the injury is covered by slough (yellow, tan, gray, green or brown) and/or eschar (tan, brown or black) in the wound bed.  Wound Description (Comments): red open area of partial skin loss with eschar covering wound bed  Present on Admission: Yes    Physical Exam: Vital Signs Blood pressure (!) 94/51, pulse 95, temperature 97.7 F (36.5 C), resp. rate 20, height 5\' 10"  (1.778 m), weight 72.1 kg, SpO2 90 %.  Gen: no distress, normal appearing HEENT: oral mucosa pink and moist, NCAT Cardio: Reg rate Chest: normal effort, normal rate of breathing Abd: soft, non-distended Ext: no edema Psych: pleasant, normal affect Skin: Severe unstageable pressure injury over right  buttock Psych: Normal mood.  Normal behavior. Musc: No edema in extremities.  No tenderness in extremities. Neuro: Alert Motor: Bilateral upper extremities: 4+/5 throughout, stable Left lower extremity: Hip flexion, knee extension 4-/5, ankle dorsiflexion 4+/5, unchanged Right lower extremity: Hip flexion, knee extension 3 +-4-/5, ankle dorsiflexion 4+/5, unchanged  Assessment/Plan: 1. Functional deficits which require 3+ hours per day of interdisciplinary therapy in a comprehensive inpatient rehab setting.  Physiatrist is providing close team supervision and 24 hour management of active medical problems listed below.  Physiatrist and rehab team continue to assess barriers to discharge/monitor patient progress toward functional and medical goals  Care Tool:  Bathing    Body parts bathed by patient: Right arm,Left arm,Chest,Abdomen,Face,Front perineal area   Body parts bathed by helper: Buttocks     Bathing assist Assist Level: Minimal Assistance - Patient > 75%     Upper Body Dressing/Undressing Upper body dressing   What is the patient wearing?: Pull over shirt,Button up shirt    Upper body assist Assist Level: Supervision/Verbal cueing    Lower Body Dressing/Undressing Lower body dressing      What is the patient wearing?: Pants,Incontinence brief     Lower body assist Assist for lower body dressing: Moderate Assistance - Patient 50 - 74%     Toileting Toileting    Toileting assist Assist for toileting: Moderate Assistance - Patient 50 - 74%     Transfers Chair/bed transfer  Transfers assist  Chair/bed transfer assist level: Contact Guard/Touching assist Chair/bed transfer assistive device: Therapist, sports   Ambulation assist   Ambulation activity did not occur: Safety/medical concerns  Assist level: Contact Guard/Touching assist Assistive device: Walker-rolling Max distance: 78ft   Walk 10 feet activity   Assist   Walk 10 feet activity did not occur: Safety/medical concerns  Assist level: Contact Guard/Touching assist Assistive device: Walker-rolling   Walk 50 feet activity   Assist Walk 50 feet with 2 turns activity did not occur: Safety/medical concerns         Walk 150 feet activity   Assist Walk 150 feet activity did not occur: Safety/medical concerns         Walk 10 feet on uneven surface  activity   Assist Walk 10 feet on uneven surfaces activity did not occur: Safety/medical concerns         Wheelchair     Assist Will patient use wheelchair at discharge?: Yes Type of Wheelchair: Manual    Wheelchair assist level: Supervision/Verbal cueing Max wheelchair distance: 172ft    Wheelchair 50 feet with 2 turns activity    Assist        Assist Level: Supervision/Verbal cueing   Wheelchair 150 feet activity     Assist      Assist Level: Supervision/Verbal cueing     Medical Problem List and Plan: 1.  Generalized weakness affecting ability to stand or complete ADL tasks secondary to debility.  Continue CIR   Previously discussed with wife plans for discharge and goals of care 2.  Antithrombotics: -DVT/anticoagulation:  Mechanical: Sequential compression devices, below knee Bilateral lower extremities due to GI bleed.             -antiplatelet therapy: N/A 3. Pain Management: Tylenol prn.   Controlled with meds on 2/20 4. Mood: LCSW to follow for evaluation and support.              -antipsychotic agents: N/a 5. Neuropsych: This patient is capable of making decisions on his own behalf. 6. Skin/Wound Care: Routine pressure relief measures.   Discussed bedding and pressure relief for healing of sacral ulcer  Hydrotherapy  -patient says he is not being repositioned every 2H, placed nursing order to request this. 7. Fluids/Electrolytes/Nutrition: Strict I/Os.  Labs with HD.discussed 1248ml fluid restriction . Megace started to stimulate appetite.   8.  ESRD: On midodrine prior to HD to help support BP.   Permanent access as outpatient  Appreciate nephro recs 9. Chronic systolic CHF/CAF: Strict I/Os. Fluid status managed with HD.              Daily weights  Lasix per Nephro, appreciate Recs  Amio per Cards recs  Filed Weights   12/01/20 0610 12/02/20 0537 12/03/20 0510  Weight: 75.8 kg 73.9 kg 72.1 kg   Decreased 2/19- continue to monitor.  10.  Pancreatitis: Has completed antibiotic course. Tolerating diet without nausea, diarrhea or abdominal pain.              Monitor closely.  Tolerating diet at present, but eating very little of meals.  11. LGIB: Continue Carafate ac/hs.  12. Acute on chronic anemia: On aranresp weekly.              Labs with HD, transfused on 2/5  Hemoglobin 7.0 on 2/15, 7.5 on 2/19 13. Thrombocytopenia: Monitor for signs of bleeding.              Labs with HD  Resolved 14. Acute respiratory failure: Has resolved.   See #16 15.  Leukocytosis-some steroid effect  WBCs 10.3 on 2/15, 11 on 2/19  Afebrile 16.  Steroid-induced hyperglycemia  SSI  Prednisone decreased to 20 on 2/4, decreased to 10 on 2/9, decreased to 5 on 2/15, DC'd on 2/19  Relatively controlled on 2/18, plan to DC CBG checks   17.  Dizziness  Likely due to anemia and soft blood pressures and limited p.o. intake in accordance with HD, patient not drinking nearly up to 1200cc fluid restriction  Trying to drink more, discussed again  Continue midodrine 18. Slow transit constipation:  Improving 19. Disposition: Discussed his concerns about discharge- he would like to return home with his wife and is trying to hire additional help at home but it is expensive for him. He has 6 children but unfortunately staying with them is not an option for him.   >35 minutes spent in direct face-to-face patient care in discussion with patient and wife regarding their concerns regarding discharge, his lack of appetite, his weakness after dialysis, his  pressure injury and expected follow-up care, messaging Dr. Louie Boston regarding patients concerns of weakness following dialysis, starting Megace  LOS: 23 days A FACE TO FACE EVALUATION WAS [place] 12/03/2020, 8:55 AM

## 2020-12-04 LAB — GLUCOSE, CAPILLARY
Glucose-Capillary: 87 mg/dL (ref 70–99)
Glucose-Capillary: 93 mg/dL (ref 70–99)
Glucose-Capillary: 85 mg/dL (ref 70–99)
Glucose-Capillary: 103 mg/dL — ABNORMAL HIGH (ref 70–99)

## 2020-12-04 MED ORDER — SODIUM CHLORIDE 0.9 % IV BOLUS
250.0000 mL | Freq: Once | INTRAVENOUS | Status: AC
  Administered 2020-12-04: 250 mL via INTRAVENOUS

## 2020-12-04 MED ORDER — SODIUM CHLORIDE 0.9 % IV BOLUS: 250.0000 mL | Freq: Once | INTRAVENOUS | Status: DC

## 2020-12-04 MED ORDER — CHLORHEXIDINE GLUCONATE CLOTH 2 % EX PADS
6.0000 | MEDICATED_PAD | Freq: Every day | CUTANEOUS | Status: DC
  Administered 2020-12-05 – 2020-12-06 (×2): 6 via TOPICAL

## 2020-12-04 NOTE — Consult Note (Signed)
[place] Nurse wound follow up Patient receiving care in [facility] [id number]. Wound type: Sacral PI stage 3 Per information from PT, C. Owens Shark, PT is discharging patient from hydrotherapy services.  I have modified the wound orders to bid saline moistened gauze and foam dressing.  See PT note for details. [place] nurse will not follow at this time.  Please re-consult the [place] team if needed.  Val Riles, RN, MSN, CWOCN, CNS-BC, pager [phone]

## 2020-12-04 NOTE — Progress Notes (Signed)
[place] PHYSICAL MEDICINE & REHABILITATION PROGRESS NOTE   Subjective/Complaints: Appreciate renal note , was dizzy this am   ROS: Denies CP, SOB, N/V/D, +poor appetite, +soreness of pressure injury  Objective:   No results found. Recent Labs    12/02/20 1853  WBC 11.0*  HGB 7.5*  HCT 24.9*  PLT 179   Recent Labs    12/02/20 1854  NA 136  K 3.2*  CL 97*  CO2 21*  GLUCOSE 107*  BUN 57*  CREATININE 7.99*  CALCIUM 8.7*   No intake or output data in the 24 hours ending 12/04/20 0857   Pressure Injury 11/10/20 Buttocks Right;Mid Unstageable - Full thickness tissue loss in which the base of the injury is covered by slough (yellow, tan, gray, green or brown) and/or eschar (tan, brown or black) in the wound bed. red open area of partial s (Active)  11/10/20 1721  Location: Buttocks  Location Orientation: Right;Mid  Staging: Unstageable - Full thickness tissue loss in which the base of the injury is covered by slough (yellow, tan, gray, green or brown) and/or eschar (tan, brown or black) in the wound bed.  Wound Description (Comments): red open area of partial skin loss with eschar covering wound bed  Present on Admission: Yes    Physical Exam: Vital Signs Blood pressure (!) 91/44, pulse 88, temperature 98.1 F (36.7 C), resp. rate 18, height 5\' 10"  (1.778 m), weight 72.1 kg, SpO2 90 %.   General: No acute distress Mood and affect are appropriate Heart: Regular rate and rhythm no rubs murmurs or extra sounds Lungs: Clear to auscultation, breathing unlabored, no rales or wheezes Abdomen: Positive bowel sounds, soft nontender to palpation, nondistended Extremities: No clubbing, cyanosis, or edema Skin: No evidence of breakdown, no evidence of rash, sacral decub stage 3 , small amt fibrinous gran tissue , compared to pt's photos of wound   Motor: Bilateral upper extremities: 4+/5 throughout, stable Left lower extremity: Hip flexion, knee extension 4-/5, ankle  dorsiflexion 4+/5, unchanged Right lower extremity: Hip flexion, knee extension 3 +-4-/5, ankle dorsiflexion 4+/5, unchanged  Assessment/Plan: 1. Functional deficits which require 3+ hours per day of interdisciplinary therapy in a comprehensive inpatient rehab setting.  Physiatrist is providing close team supervision and 24 hour management of active medical problems listed below.  Physiatrist and rehab team continue to assess barriers to discharge/monitor patient progress toward functional and medical goals  Care Tool:  Bathing    Body parts bathed by patient: Right arm,Left arm,Chest,Abdomen,Face,Front perineal area   Body parts bathed by helper: Buttocks     Bathing assist Assist Level: Minimal Assistance - Patient > 75%     Upper Body Dressing/Undressing Upper body dressing   What is the patient wearing?: Pull over shirt,Button up shirt    Upper body assist Assist Level: Supervision/Verbal cueing    Lower Body Dressing/Undressing Lower body dressing      What is the patient wearing?: Pants,Incontinence brief     Lower body assist Assist for lower body dressing: Moderate Assistance - Patient 50 - 74%     Toileting Toileting    Toileting assist Assist for toileting: Moderate Assistance - Patient 50 - 74%     Transfers Chair/bed transfer  Transfers assist     Chair/bed transfer assist level: Contact Guard/Touching assist Chair/bed transfer assistive device: Armrests,Walker   Locomotion Ambulation   Ambulation assist   Ambulation activity did not occur: Safety/medical concerns  Assist level: Contact Guard/Touching assist Assistive device: Walker-rolling Max distance:  32ft   Walk 10 feet activity   Assist  Walk 10 feet activity did not occur: Safety/medical concerns  Assist level: Contact Guard/Touching assist Assistive device: Walker-rolling   Walk 50 feet activity   Assist Walk 50 feet with 2 turns activity did not occur: Safety/medical  concerns         Walk 150 feet activity   Assist Walk 150 feet activity did not occur: Safety/medical concerns         Walk 10 feet on uneven surface  activity   Assist Walk 10 feet on uneven surfaces activity did not occur: Safety/medical concerns         Wheelchair     Assist Will patient use wheelchair at discharge?: Yes Type of Wheelchair: Manual    Wheelchair assist level: Supervision/Verbal cueing Max wheelchair distance: 144ft    Wheelchair 50 feet with 2 turns activity    Assist        Assist Level: Supervision/Verbal cueing   Wheelchair 150 feet activity     Assist      Assist Level: Supervision/Verbal cueing     Medical Problem List and Plan: 1.  Generalized weakness affecting ability to stand or complete ADL tasks secondary to debility.  Continue CIR PT, OT d/c date 2/24  Previously discussed with wife plans for discharge and goals of care 2.  Antithrombotics: -DVT/anticoagulation:  Mechanical: Sequential compression devices, below knee Bilateral lower extremities due to GI bleed.             -antiplatelet therapy: N/A 3. Pain Management: Tylenol prn.   Controlled with meds on 2/20 4. Mood: LCSW to follow for evaluation and support.              -antipsychotic agents: N/a 5. Neuropsych: This patient is capable of making decisions on his own behalf. 6. Skin/Wound Care: Routine pressure relief measures.   Wound looks good Wet to dry with Dakins solution covered by foamn dressing   -patient says he is not being repositioned every 2H, placed nursing order to request this. 7. Fluids/Electrolytes/Nutrition: Strict I/Os.  Labs with HD.discussed 1217ml fluid restriction . Megace started to stimulate appetite.  8.  ESRD: On midodrine prior to HD to help support BP.   Permanent access as outpatient  Appreciate nephro recs 9. Chronic systolic CHF/CAF: Strict I/Os. Fluid status managed with HD.              Daily weights  Lasix per  Nephro, appreciate Recs  Amio per Cards recs  Filed Weights   12/01/20 0610 12/02/20 0537 12/03/20 0510  Weight: 75.8 kg 73.9 kg 72.1 kg   Decreased 2/19- continue to monitor.  10.  Pancreatitis: Has completed antibiotic course. Tolerating diet without nausea, diarrhea or abdominal pain.              Monitor closely.  Tolerating diet at present, but eating very little of meals.  11. LGIB: Continue Carafate ac/hs.  12. Acute on chronic anemia: On aranresp weekly.              Labs with HD, transfused on 2/5  Hemoglobin 7.0 on 2/15, 7.5 on 2/19 13. Thrombocytopenia: Monitor for signs of bleeding.              Labs with HD  Resolved 14. Acute respiratory failure: Has resolved.   See #16 15.  Leukocytosis-some steroid effect  WBCs 10.3 on 2/15, 11 on 2/19  Afebrile 16.  Steroid-induced hyperglycemia  SSI  Prednisone  decreased to 20 on 2/4, decreased to 10 on 2/9, decreased to 5 on 2/15, DC'd on 2/19  Relatively controlled on 2/18, plan to DC CBG checks   17.  Dizziness  Likely due to anemia and soft blood pressures and limited p.o. intake in accordance with HD, patient not drinking nearly up to 1200cc fluid restriction  Trying to drink more, discussed again  Continue midodrine Check ortho BPs today  18. Slow transit constipation:  Improving 19. Disposition: Discussed his concerns about discharge- he would like to return home with his wife and is trying to hire additional help at home but it is expensive for him. He has 6 children but unfortunately staying with them is not an option for him.   Pt cont to c/o dizziness , once again discussed nbeed to increase oral intake of fluids, if nauseated needs zofran first   LOS: 24 days A FACE TO Ama E Raini Tiley 12/04/2020, 8:57 AM

## 2020-12-04 NOTE — Progress Notes (Signed)
Patient ID: Raymond Hurley., male   DOB: 06-28-1931, 87 y.o.   MRN: [id number] Spoke with [facility] care who requested a med list and last vitals. Pt and wife in agreement to send this to her. Wife here for education today in preparation for discharge Thursday

## 2020-12-04 NOTE — Evaluation (Signed)
Speech Language Pathology Assessment and Plan  Patient Details  Name: Raymond Hurley. MRN: [id number] Date of Birth: Aug 09, 1931  SLP Diagnosis: Other (comment) (N/A for ST)  Rehab Potential: Excellent ELOS: N/A for ST    Today's Date: 12/04/2020 SLP Individual Time: 0730-0800 SLP Individual Time Calculation (min): 30 min   Hospital Problem: Principal Problem:   Debility Active Problems:   Steroid-induced hyperglycemia   Leucocytosis   Acute on chronic anemia   ESRD on dialysis [facility])   Palliative care by specialist   Acute pancreatitis   Dizziness and giddiness  Past Medical History:  Past Medical History:  Diagnosis Date  . Atrial fibrillation ([place])   . CAD (coronary artery disease) 04/2019  . CHF (congestive heart failure) ([place])   . Melanoma ([place])   . Vitamin B 12 deficiency    Past Surgical History:  Past Surgical History:  Procedure Laterality Date  . CARDIOVERSION N/A 06/02/2019   Procedure: CARDIOVERSION;  Surgeon: Jerline Pain, MD;  Location: [facility] ENDOSCOPY;  Service: Cardiovascular;  Laterality: N/A;  . IR FLUORO GUIDE CV LINE RIGHT  11/06/2020  . IR US GUIDE VASC ACCESS RIGHT  11/06/2020  . RIGHT/LEFT HEART CATH AND CORONARY ANGIOGRAPHY N/A 04/27/2019   Procedure: RIGHT/LEFT HEART CATH AND CORONARY ANGIOGRAPHY;  Surgeon: Jolaine Artist, MD;  Location: [facility] CV LAB;  Service: Cardiovascular;  Laterality: N/A;  . TEE WITHOUT CARDIOVERSION N/A 06/02/2019   Procedure: TRANSESOPHAGEAL ECHOCARDIOGRAM (TEE);  Surgeon: Jerline Pain, MD;  Location: [facility] ENDOSCOPY;  Service: Cardiovascular;  Laterality: N/A;    Assessment / Plan / Recommendation Clinical Impression   HPI: Raymond Hurley is an 87 year old male with history of melanoma, vitamin B12 deficiency, A. fib--on Eliquis who was admitted on 10/14/2020 with acute right facial droop, dysarthria, and difficulty following commands.  History taken from chart review and patient.  Family reported patient with  recent diagnosis of Covid with diarrhea and poor p.o. intake.  MRI/MRA head was unremarkable for acute intracranial process.  He was found to have leukocytosis with elevated BNP as well as acute kidney injury and elevated D-dimer.  He was treated with fluid protocol and IV antibiotics for septic shock. CT abdomen 01/07 showed multifocal pneumonia as well as severe inflammatory changes around pancreas consistent with severe pancreatitis as well as 4.6 x 2.3 cm low-density in right iliac is muscle concerning for developing abscess.  Dr. Alessandra Bevels consulted for input and pancreatitis treated with supportive care as well as course of meropenem and linezolid.  Acute renal failure felt to be due to to ATN with hypovolemia and hypotension.  He required CVVHD and has been transitioned to hemodialysis.  Acute hypoxic respiratory failure due to aspiration pneumonia treated with stress dose steroids with improvement with recommendations for slow prednisone taper.  Amiodarone was added for rate control of atrial fibrillation with RVR per cardiology input.  He has had issues with GIB due to hematochezia and required multiple transfusions and anticoagulation was discontinued.  Thrombocytopenia being monitored.  He completed antibiotic course on 10/31/2020 and follow-up CT abdomen 11/06/2020 showed severe acute pancreatitis with probable early pseudocyst formation as well as increased thickness in the right psoas muscle and no change in right iliac [place] muscle.  Current recommendations are to monitor off antibiotics. Encephalopathy improving, he is tolerating regular diet and currently noted to be significantly debilitated with generalized weakness affecting ability to stand or complete ADL tasks. Pt was admitted to CIR 11/10/20 and on initial ST  evaluation pt's cognition was [facility] and he was tolerating regular/thin diet. Due to reports of decreased PO intake, increased secretions, and difficulty consuming medications (per RN), ST  has been re-consulted. Bedside swallow evaluation was conducted 12/04/20 with results as follows:  Pt's swallow function appears WFL from an oral and pharyngeal standpoint. His mastication and oral clearance or purees and regular textures from his breakfast tray was fully efficient. No overt s/sx aspiration were observed across regular solids or thin liquids. Although he does report an increase in pharyngeal congestion and having to use oral suction for secretion management, pt also stated he had productive cough PTA, but could expectorate orally without need for suction. Suspect that as pt's strength and mobility increases, he will be able to manage secretions orally again. He also reports acute difficulty swallowing large pills, but is tolerating them crushed in applesauce. Furthermore, pt confirms a decrease appetite and weight loss, however this does not appear to be rooted in oral or pharyngeal swallow deficits. He denied significant coughing with POs. Would recommend pt continue current diet, medications crushed, and alternate solids and liquids when eating. No follow up skilled ST is indicated that this time.    Skilled Therapeutic Interventions          Bedside swallow evaluation was administered and results were reviewed with pt (please see above for details regarding the results).   SLP Assessment  Patient does not need any further Speech Lanaguage Pathology Services    Recommendations  Medication Administration: Crushed with puree Supervision: Patient able to self feed Compensations: Slow rate;Small sips/bites;Follow solids with liquid Postural Changes and/or Swallow Maneuvers: Seated upright 90 degrees;Upright 30-60 min after meal Oral Care Recommendations: Oral care BID Patient destination: Home Follow up Recommendations: None Equipment Recommended: None recommended by SLP         SLP Duration   N/A for ST          Pain Pain Assessment Pain Scale: Faces Faces Pain Scale:  No hurt      Care Tool Care Tool Cognition Expression of Ideas and Wants Expression of Ideas and Wants: Without difficulty (complex and basic) - expresses complex messages without difficulty and with speech that is clear and easy to understand   Understanding Verbal and Non-Verbal Content Understanding Verbal and Non-Verbal Content: Understands (complex and basic) - clear comprehension without cues or repetitions   Memory/Recall Ability *first 3 days only Memory/Recall Ability *first 3 days only: Current season;Location of own room;Staff names and faces;That he or she is in a hospital/hospital unit        Bedside Swallowing Assessment General Date of Onset: 12/01/20 Previous Swallow Assessment: BSE 11/11/20 Diet Prior to this Study: Regular;Thin liquids Temperature Spikes Noted: No Respiratory Status: Room air History of Recent Intubation: No Behavior/Cognition: Alert;Cooperative;Pleasant mood Oral Cavity - Dentition: Adequate natural dentition Self-Feeding Abilities: Able to feed self Vision: Functional for self-feeding Patient Positioning: Upright in bed Baseline Vocal Quality: Normal Volitional Cough: Strong Volitional Swallow: Able to elicit  Oral Care Assessment Does patient have any of the following "high(er) risk" factors?: None of the above Does patient have any of the following "at risk" factors?: None of the above Patient is LOW RISK: Follow universal precautions (see row information) Ice Chips Ice chips: Not tested Thin Liquid Thin Liquid: Within functional limits Presentation: Straw;Self Fed Nectar Thick Nectar Thick Liquid: Not tested Honey Thick Honey Thick Liquid: Not tested Puree Puree: Within functional limits Presentation: Self Fed;Spoon Solid Solid: Within functional limits Presentation: Self  Fed BSE Assessment Risk for Aspiration Impact on safety and function: Mild aspiration risk;Risk for inadequate nutrition/hydration Other Related Risk  Factors: History of pneumonia;Decreased management of secretions  Short Term Goals: No short term goals set   Recommendations for other services: None    The above assessment, treatment plan, treatment alternatives and goals were discussed and mutually agreed upon: by patient  Arbutus Leas 12/04/2020, 8:07 AM

## 2020-12-04 NOTE — Progress Notes (Signed)
   12/04/20 1153  Vitals  Temp 97.9 F (36.6 C)  Temp Source Oral  BP (!) 98/51  BP Location Right Arm  BP Method Automatic  Patient Position (if appropriate) Lying  Pulse Rate 80  Pulse Rate Source Dinamap  Resp 15  MEWS COLOR  MEWS Score Color Green  Oxygen Therapy  SpO2 98 %  O2 Device Room Air  MEWS Score  MEWS Temp 0  MEWS Systolic 1  MEWS Pulse 0  MEWS RR 0  MEWS LOC 0  MEWS Score 1

## 2020-12-04 NOTE — Plan of Care (Signed)
  Problem: RH Stairs Goal: LTG Patient will ambulate up and down stairs w/assist (PT) Description: LTG: Patient will ambulate up and down # of stairs with assistance (PT) Outcome: Not Applicable Flowsheets (Taken 12/04/2020 0737) LTG: Pt will ambulate up/down stairs assist needed:: (D/C) -- Note: D/C   Problem: RH Ambulation Goal: LTG Patient will ambulate in controlled environment (PT) Description: LTG: Patient will ambulate in a controlled environment, # of feet with assistance (PT). Flowsheets (Taken 12/04/2020 0737) LTG: Pt will ambulate in controlled environ  assist needed:: (downgraded due to fatigue, SOB, dizziness, and generalized weakness) Contact Guard/Touching assist LTG: Ambulation distance in controlled environment: 47ft with LRAD Note: downgraded due to fatigue, SOB, dizziness, and generalized weakness Goal: LTG Patient will ambulate in home environment (PT) Description: LTG: Patient will ambulate in home environment, # of feet with assistance (PT). Flowsheets (Taken 12/04/2020 0737) LTG: Pt will ambulate in home environ  assist needed:: (downgraded due to fatigue, SOB, dizziness, and generalized weakness) Contact Guard/Touching assist LTG: Ambulation distance in home environment: 58ft with LRAD Note: downgraded due to fatigue, SOB, dizziness, and generalized weakness

## 2020-12-04 NOTE — Progress Notes (Signed)
Physical Therapy Wound Treatment and Discharge Patient Details  Name: Raymond Hurley. MRN: [id number] Date of Birth: 02-25-1931  Today's Date: 12/04/2020 PT Individual Time: 2778-2423 PT Individual Time Calculation (min): 22 min   Subjective  Subjective: Pt reporting he was hypotensive during physical therapy today Patient and Family Stated Goals: heal wound Prior Treatments: dressing change  Pain Score:  4/10  Wound Assessment  Pressure Injury 11/10/20 Buttocks Right;Mid Unstageable - Full thickness tissue loss in which the base of the injury is covered by slough (yellow, tan, gray, green or brown) and/or eschar (tan, brown or black) in the wound bed. red open area of partial s (Active)  Wound Image   12/04/20 1201  Dressing Type Barrier Film (skin prep);Foam - Lift dressing to assess site every shift;Moist to dry;Gauze (Comment) 12/04/20 1201  Dressing Changed 12/04/20 1201  Dressing Change Frequency Daily 12/04/20 1201  State of Healing Early/partial granulation 12/04/20 1201  Site / Wound Assessment Yellow;Pale;Pink 12/04/20 1201  % Wound base Red or Granulating 80% 12/04/20 1201  % Wound base Yellow/Fibrinous Exudate 20% 12/04/20 1201  % Wound base Black/Eschar 0% 12/04/20 1201  % Wound base Other/Granulation Tissue (Comment) 0% 12/04/20 1201  Peri-wound Assessment Intact;Erythema (blanchable) 12/04/20 1201  Wound Length (cm) 3.8 cm 11/29/20 1255  Wound Width (cm) 2.5 cm 11/29/20 1255  Wound Depth (cm) 0.5 cm 11/29/20 1255  Wound Surface Area (cm^2) 9.5 cm^2 11/29/20 1255  Wound Volume (cm^3) 4.75 cm^3 11/29/20 1255  Margins Unattached edges (unapproximated) 12/04/20 1201  Drainage Amount None 12/04/20 1201  Drainage Description Green;No odor 12/02/20 1300  Treatment Hydrotherapy (Pulse lavage);Packing (Saline gauze) 12/04/20 1201   Hydrotherapy Pulsed lavage therapy - wound location: superior gluteal cleft Pulsed Lavage with Suction (psi): 12 psi Pulsed Lavage with  Suction - Normal Saline Used: 1000 mL Pulsed Lavage Tip: Tip with splash shield   Wound Assessment and Plan  Wound Therapy - Assess/Plan/Recommendations Wound Therapy - Clinical Statement: Pt completed Dakin's solution treatment and no blue/green drainage noted today. Wound has progressed significantly with only ~20% thin layer of yellow slough remaining. Recommend continued Santyl and daily dressing changes. Updated [place]. No further hydrotherapy needs. Wound Therapy - Functional Problem List: decreased mobility Factors Delaying/Impairing Wound Healing: Immobility Hydrotherapy Plan: Debridement;Patient/family education;Pulsatile lavage with suction Wound Therapy - Frequency:  (d/c hydrotherapy) Wound Therapy - Follow Up Recommendations: Home health RN Wound Plan: see above  Wound Therapy Goals- Improve the function of patient's integumentary system by progressing the wound(s) through the phases of wound healing (inflammation - proliferation - remodeling) by: Decrease Necrotic Tissue to: 20 Decrease Necrotic Tissue - Progress: Met Increase Granulation Tissue to: 80 Increase Granulation Tissue - Progress: Met  Goals will be updated until maximal potential achieved or discharge criteria met.  Discharge criteria: when goals achieved, discharge from hospital, MD decision/surgical intervention, no progress towards goals, refusal/missing three consecutive treatments without notification or medical reason.  GP    Wyona Almas, PT, DPT Acute Rehabilitation Services Pager [phone] Office [phone]   Raymond Hurley 12/04/2020, 12:05 PM

## 2020-12-04 NOTE — Progress Notes (Signed)
Occupational Therapy Session Note  Patient Details  Name: Raymond Hurley. MRN: [id number] Date of Birth: 04-12-1931  Today's Date: 12/04/2020 OT Individual Time: 1000-1045   &   1300-1345 OT Individual Time Calculation (min): 45 min & 45 min   Short Term Goals: Week 3:  OT Short Term Goal 1 (Week 3): Pt will transfer to toilet with MIN A consistently OT Short Term Goal 1 - Progress (Week 3): Met OT Short Term Goal 2 (Week 3): Pt will stand to complete 1 grooming task to demo improved activity tolerance OT Short Term Goal 2 - Progress (Week 3): Progressing toward goal OT Short Term Goal 3 (Week 3): Pt will complete 2/3 toileting steps consistently OT Short Term Goal 3 - Progress (Week 3): Progressing toward goal OT Short Term Goal 4 (Week 3): Pt will don socks/shoes with min assist with AE PRN OT Short Term Goal 4 - Progress (Week 3): Met Week 4:  OT Short Term Goal 1 (Week 4): STG = LTGs due to remaining LOS  Skilled Therapeutic Interventions/Progress Updates:    AM session:   Patient in bed, alert and ready for therapy - he notes that he feels tired today despite having time to rest over the weekend.  He notes having a better breakfast today, but low BP.  C/O pain in sacral area with change of position.  teds donned in supine.   BP checked in supine - 92/53, left cuff on and checked BP in sitting position - 46/17, returned immediately to side lying position and rechecked - 82/38.  Nursing alerted immediately and she communicated with MD on call.  Patient continues with clear cognition and conversational.  Provided pillow support to remain in side lying position for pressure relief.  He was able to complete ankle pumps and hand/arm mobility activities.  Nursing able to provide 1:1 care - patient missed 15 minutes of session, will follow up as patient medically cleared.     PM session:   Family education - Patient in bed, eating lunch, on IV fluids, wife present.   Patient states that he  is feeling a little better but tired.  While he finished lunch reviewed adl (dressing, bathing and toileting) and transfer status with wife and emphasized variation in his ability pending fatigue and tolerance of OOB/sitting.  Reviewed use of assistive devices and commode that could be used bedside as needed.  Wife states that she has a 3 in 1 commode at home.  Reviewed skin care/pressure relief & weight shift options both at bed and w/c level.  BP checked 111/58 - patient able to roll left and right using bed rails to place abdominal binder.  Nursing and PT entered for transition/hand off to PT session at this time.    Therapy Documentation Precautions:  Precautions Precautions: Fall,Other (comment) Precaution Comments: Bowel incontinence (apparently had flexiseal removed yesterday) Restrictions Weight Bearing Restrictions: No   Therapy/Group: Individual Therapy  CEFERINO LANG 12/04/2020, 7:40 AM

## 2020-12-04 NOTE — Progress Notes (Signed)
Discussed patient with Dr. Royce Macadamia who recommended 250 cc NS bolus then evaluate/vitals to monitor for symptoms. If no better to repeat bolus.

## 2020-12-04 NOTE — Progress Notes (Signed)
Patient ID: Raymond Hurley., male   DOB: 1930/11/10, 87 y.o.   MRN: [id number]  Met with pt and wife who was here for education but did not get much done due to pt's BP issues. He had to be put in the bed it was so low. Attempted to re-schedule education with wife and she would not commit to a day due to felt he can not go home like this and the discharge will need to be changed. She did agreed to touch base with this worker in the am regarding discussing this again. Did discuss equipment-she has a adjustable bed going to the home and has a rolling walker, rollator and 3 in 1. She does want the wheelchair and tub bench along with a suction machine pt uses for secretions. She continues to discuss he is not walking or moving well and she expects him to be before coming home. She wanted to talk to an nutritionist and have secure chat her to reach out to wife to address her questions. Discussed how pt was going home, could go wheelchair Lucianne Lei she would need to set up. She plans to use [facility] for HD for pt. She again asked who would be doing the wound care, discussed she would need to be educated on this and the [facility] would address also. She wants someone to do this and not she, discussed she would need to hire someone then. Will call in am to try to set up more education in anticipation of discharge.

## 2020-12-04 NOTE — Progress Notes (Signed)
Bolus given prior to lunch time  as patient continued to report weakness but was feeling better with SPB up to 90's when supine. Cleared patient for therapy after bolus as was recovering but he dropped with activity with SBP back down to 59/36 with activity. Bradycardia also noted--will order EKG for confirmation. Ordered second dose bolus and will update Dr. Royce Macadamia.

## 2020-12-04 NOTE — Progress Notes (Signed)
[facility] ROUNDING NOTE   Subjective:    Got a couple of 250 mL miniboluses today for hypotension and dizziness.  His wife is on speakerphone and yesterday was the first time that she's not seen his legs swollen   Review of systems:  Hasn't made urine in about a week His wife doesn't think he's eating well  Denies overt shortness of breath - has had low dose oxygen on --------  Brief history: This is a 87 year old gentleman who remained anuric oliguric following Covid infection.  He is now CIR requiring intensive physical therapy.  His baseline creatinine is 1.2.  However he has been dialysis dependent since admission and is now on a Tuesday Thursday Saturday dialysis schedule.  There is no recorded urine output.  Patient undergoing CLIP.  No definitive chair yet.   Objective:  Vital signs in last 24 hours:  Temp:  [97.6 F (36.4 C)-98.2 F (36.8 C)] 98 F (36.7 C) (02/21 1740) Pulse Rate:  [78-102] 78 (02/21 1740) Resp:  [15-20] 15 (02/21 1740) BP: (46-112)/(17-61) 93/53 (02/21 1740) SpO2:  [90 %-100 %] 94 % (02/21 1740)  Weight change:  Filed Weights   12/01/20 0610 12/02/20 0537 12/03/20 0510  Weight: 75.8 kg 73.9 kg 72.1 kg    Intake/Output: I/O last 3 completed shifts: In: -  Out: 600 [Other:600]   Intake/Output this shift:  Total I/O In: 239.3 [IV Piggyback:239.3] Out: -   General:  Sitting in bed, no distress Heart:S1S2 no rub Lungs: clear and reduced; unlabored at rest on 2 liters Abdomen:soft, Non-tender, non-distended Extremities: no edema Neuro - alert and oriented x 3 provides hx and follows commands Psych normal mood and affect Dialysis Access: Right IJ [facility]     Basic Metabolic Panel: Recent Labs  Lab 11/28/20 1601 12/02/20 1854  NA 131* 136  K 3.7 3.2*  CL 94* 97*  CO2 20* 21*  GLUCOSE 125* 107*  BUN 71* 57*  CREATININE 8.45* 7.99*  CALCIUM 8.3* 8.7*  PHOS 2.8 2.4*    Liver Function Tests: Recent Labs  Lab  11/28/20 1601 12/02/20 1854  ALBUMIN 2.5* 2.5*   No results for input(s): LIPASE, AMYLASE in the last 168 hours. No results for input(s): AMMONIA in the last 168 hours.  CBC: Recent Labs  Lab 11/28/20 1601 12/02/20 1853  WBC 10.3 11.0*  HGB 7.0* 7.5*  HCT 22.9* 24.9*  MCV 100.0 103.3*  PLT 210 179    Cardiac Enzymes: No results for input(s): CKTOTAL, CKMB, CKMBINDEX, TROPONINI in the last 168 hours.  BNP: Invalid input(s): POCBNP  CBG: Recent Labs  Lab 12/03/20 1711 12/03/20 2122 12/04/20 0634 12/04/20 1148 12/04/20 1618  GLUCAP 124* 126* 87 93 85    Microbiology: Results for orders placed or performed during the hospital encounter of 10/14/20  Resp Panel by RT-PCR (Flu A&B, Covid) Nasopharyngeal Swab     Status: Abnormal   Collection Time: 10/14/20  2:21 AM   Specimen: Nasopharyngeal Swab; Nasopharyngeal(NP) swabs in vial transport medium  Result Value Ref Range Status   SARS Coronavirus 2 by RT PCR POSITIVE (A) NEGATIVE Final    Comment: RESULT CALLED TO, READ BACK BY AND VERIFIED WITH: G CERER RN 10/14/20 0349 JDW (NOTE) SARS-CoV-2 target nucleic acids are DETECTED.  The SARS-CoV-2 RNA is generally detectable in upper respiratory specimens during the acute phase of infection. Positive results are indicative of the presence of the identified virus, but do not rule out bacterial infection or co-infection with other pathogens not  detected by the test. Clinical correlation with patient history and other diagnostic information is necessary to determine patient infection status. The expected result is Negative.  Fact Sheet for Patients: EntrepreneurPulse.com.au  Fact Sheet for Healthcare Providers: IncredibleEmployment.be  This test is not yet approved or cleared by the [place] FDA and  has been authorized for detection and/or diagnosis of SARS-CoV-2 by FDA under an Emergency Use Authorization (EUA).  This EUA  will remain in effect (meaning this test can be used)  for the duration of  the COVID-19 declaration under Section 564(b)(1) of the Act, 21 U.S.C. section 360bbb-3(b)(1), unless the authorization is terminated or revoked sooner.     Influenza A by PCR NEGATIVE NEGATIVE Final   Influenza B by PCR NEGATIVE NEGATIVE Final    Comment: (NOTE) The Xpert Xpress SARS-CoV-2/FLU/RSV plus assay is intended as an aid in the diagnosis of influenza from Nasopharyngeal swab specimens and should not be used as a sole basis for treatment. Nasal washings and aspirates are unacceptable for Xpert Xpress SARS-CoV-2/FLU/RSV testing.  Fact Sheet for Patients: EntrepreneurPulse.com.au  Fact Sheet for Healthcare Providers: IncredibleEmployment.be  This test is not yet approved or cleared by the [place] FDA and has been authorized for detection and/or diagnosis of SARS-CoV-2 by FDA under an Emergency Use Authorization (EUA). This EUA will remain in effect (meaning this test can be used) for the duration of the COVID-19 declaration under Section 564(b)(1) of the Act, 21 U.S.C. section 360bbb-3(b)(1), unless the authorization is terminated or revoked.  Performed at [facility], [place] [address], [place], [place] [address]   Culture, blood (routine x 2)     Status: None   Collection Time: 10/14/20 10:36 AM   Specimen: BLOOD RIGHT HAND  Result Value Ref Range Status   Specimen Description BLOOD RIGHT HAND  Final   Special Requests   Final    BOTTLES DRAWN AEROBIC AND ANAEROBIC Blood Culture adequate volume   Culture   Final    NO GROWTH 5 DAYS Performed at [facility], [place] [address] [place], [place] [address]    Report Status 10/19/2020 FINAL  Final  Culture, blood (routine x 2)     Status: None   Collection Time: 10/14/20 10:36 AM   Specimen: BLOOD LEFT HAND  Result Value Ref Range Status   Specimen Description BLOOD LEFT HAND  Final    Special Requests   Final    BOTTLES DRAWN AEROBIC AND ANAEROBIC Blood Culture results may not be optimal due to an inadequate volume of blood received in culture bottles   Culture   Final    NO GROWTH 5 DAYS Performed at [facility], [place] [address], [place], [place] [address]    Report Status 10/19/2020 FINAL  Final  Gastrointestinal Panel by PCR , Stool     Status: None   Collection Time: 10/14/20 11:00 AM   Specimen: Stool  Result Value Ref Range Status   Campylobacter species NOT DETECTED NOT DETECTED Final   Plesimonas shigelloides NOT DETECTED NOT DETECTED Final   Salmonella species NOT DETECTED NOT DETECTED Final   Yersinia enterocolitica NOT DETECTED NOT DETECTED Final   Vibrio species NOT DETECTED NOT DETECTED Final   Vibrio cholerae NOT DETECTED NOT DETECTED Final   Enteroaggregative E coli (EAEC) NOT DETECTED NOT DETECTED Final   Enteropathogenic E coli (EPEC) NOT DETECTED NOT DETECTED Final   Enterotoxigenic E coli (ETEC) NOT DETECTED NOT DETECTED Final   Shiga like toxin  producing E coli (STEC) NOT DETECTED NOT DETECTED Final   Shigella/Enteroinvasive E coli (EIEC) NOT DETECTED NOT DETECTED Final   Cryptosporidium NOT DETECTED NOT DETECTED Final   Cyclospora cayetanensis NOT DETECTED NOT DETECTED Final   Entamoeba histolytica NOT DETECTED NOT DETECTED Final   Giardia lamblia NOT DETECTED NOT DETECTED Final   Adenovirus F40/41 NOT DETECTED NOT DETECTED Final   Astrovirus NOT DETECTED NOT DETECTED Final   Norovirus GI/GII NOT DETECTED NOT DETECTED Final   Rotavirus A NOT DETECTED NOT DETECTED Final   Sapovirus (I, II, IV, and V) NOT DETECTED NOT DETECTED Final    Comment: Performed at [facility], [place] [place], [place] [address]  C Difficile Quick Screen w PCR reflex     Status: None   Collection Time: 10/14/20 11:00 AM   Specimen: STOOL  Result Value Ref Range Status   C Diff antigen NEGATIVE NEGATIVE Final   C Diff toxin NEGATIVE NEGATIVE  Final   C Diff interpretation No C. difficile detected.  Final    Comment: Performed at [facility], [place] [address], [place], [place] [address]  MRSA PCR Screening     Status: None   Collection Time: 10/15/20 12:21 PM   Specimen: Nasal Mucosa; Nasopharyngeal  Result Value Ref Range Status   MRSA by PCR NEGATIVE NEGATIVE Final    Comment:        The GeneXpert MRSA Assay (FDA approved for NASAL specimens only), is one component of a comprehensive MRSA colonization surveillance program. It is not intended to diagnose MRSA infection nor to guide or monitor treatment for MRSA infections. Performed at [facility], [place] [address], [place], [place] [address]   Culture, blood (Routine X 2) w Reflex to ID Panel     Status: None   Collection Time: 10/20/20  4:40 PM   Specimen: BLOOD RIGHT HAND  Result Value Ref Range Status   Specimen Description BLOOD RIGHT HAND  Final   Special Requests   Final    BOTTLES DRAWN AEROBIC AND ANAEROBIC Blood Culture adequate volume   Culture   Final    NO GROWTH 5 DAYS Performed at [facility], [place] [address], [place], [place] [address]    Report Status 10/25/2020 FINAL  Final  Culture, blood (Routine X 2) w Reflex to ID Panel     Status: None   Collection Time: 10/20/20  4:43 PM   Specimen: BLOOD  Result Value Ref Range Status   Specimen Description BLOOD LEFT ANTECUBITAL  Final   Special Requests   Final    BOTTLES DRAWN AEROBIC ONLY Blood Culture results may not be optimal due to an inadequate volume of blood received in culture bottles   Culture   Final    NO GROWTH 5 DAYS Performed at [facility], [place] [address], [place], [place] [address]    Report Status 10/25/2020 FINAL  Final  MRSA PCR Screening     Status: None   Collection Time: 10/21/20 10:08 AM   Specimen: Nasal Mucosa; Nasopharyngeal  Result Value Ref Range Status   MRSA by PCR NEGATIVE NEGATIVE Final    Comment:        The GeneXpert MRSA Assay  (FDA approved for NASAL specimens only), is one component of a comprehensive MRSA colonization surveillance program. It is not intended to diagnose MRSA infection nor to guide or monitor treatment for MRSA infections. Performed at [facility], [place]  [address], [place],  [address]     Coagulation Studies: No results for input(s): LABPROT, INR in the last 72 hours.  Urinalysis: No results for input(s): COLORURINE, LABSPEC, PHURINE, GLUCOSEU, HGBUR, BILIRUBINUR, KETONESUR, PROTEINUR, UROBILINOGEN, NITRITE, LEUKOCYTESUR in the last 72 hours.  Invalid input(s): APPERANCEUR    Imaging: No results found.   Medications:   . sodium chloride     . sodium chloride   Intravenous Once  . amiodarone  200 mg Oral BID   Followed by  . [START ON 12/08/2020] amiodarone  200 mg Oral Daily  . B-complex with vitamin C  1 tablet Oral Daily  . Chlorhexidine Gluconate Cloth  6 each Topical Q0600  . [START ON 12/05/2020] Chlorhexidine Gluconate Cloth  6 each Topical Q0600  . darbepoetin (ARANESP) injection - DIALYSIS  200 mcg Intravenous Q Tue-HD  . docusate  100 mg Oral BID  . feeding supplement (NEPRO CARB STEADY)  237 mL Oral TID WC  . Gerhardt's butt cream   Topical TID  . lactobacillus acidophilus & bulgar  1 tablet Oral TID WC  . lidocaine  1 patch Transdermal Q24H  . mouth rinse  15 mL Mouth Rinse BID  . megestrol  40 mg Oral Daily  . midodrine  10 mg Oral TID WC  . sevelamer carbonate  0.8 g Oral TID WC  . sucralfate  1 g Oral TID WC & HS  . triamcinolone 0.1 % cream : eucerin   Topical BID   acetaminophen, bisacodyl, calcium carbonate, chlorpheniramine-HYDROcodone, diphenhydrAMINE, loperamide, polyethylene glycol, prochlorperazine **OR** prochlorperazine **OR** prochlorperazine, Resource ThickenUp Clear, traZODone  Assessment/ Plan:  1. Acute kidney Injury now considered ESRD: Baseline creatinine 1.2 with severe ATN related to Covid and acute pancreatitis.  No signs of  recovery at this time.  Planning for permanent access as an outpatient once his strength is improved.  -TTS schedule -Continue CLIP process  - renal panel in AM - would stop carafate with ESRD  2.Acute pancreatitis:This was noted on CT scan earlier (1/3 and 1/7). 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  Now status post treatment  3. Acute hypoxic respiratory failure:Secondary to Covid pneumonia and possible aspiration pneumonitis. An improvement at this time  4. Hypotension - with dizziness.  S/p miniboluses today x 2.  No UF with HD on 2/22.  On midodrine  5. Anemia: Secondary to acute/critical illness and GI losses.Continue current dose of ESA.  IV iron prohibitive because of very high ferritin level.  Received PRBC.   6 Secondary hyperparathyroidism:stop sevelamer - hypophos. PTH level 217 at goal.   Claudia Desanctis, MD 12/04/2020 6:41 PM

## 2020-12-04 NOTE — Progress Notes (Signed)
Physical Therapy Session Note  Patient Details  Name: Raymond Hurley. MRN: [id number] Date of Birth: 10/29/1930  Today's Date: 12/04/2020 PT Individual Time: 7893-8101 PT Individual Time Calculation (min): 33 min   Today's Date: 12/04/2020 PT Missed Time: 42 Minutes Missed Time Reason: Other (Comment);Patient fatigue (low BP)  Short Term Goals: Week 2:  PT Short Term Goal 1 (Week 2): Pt will perform stand<>pivot transfer with LRAD and CGA PT Short Term Goal 1 - Progress (Week 2): Met PT Short Term Goal 2 (Week 2): Pt will ambulate 78f with LRAD and min A PT Short Term Goal 2 - Progress (Week 2): Met PT Short Term Goal 3 (Week 2): Pt will perform bed mobility with CGA consistantly PT Short Term Goal 3 - Progress (Week 2): Progressing toward goal Week 3:  PT Short Term Goal 1 (Week 3): STG=LTG due to LOS  Skilled Therapeutic Interventions/Progress Updates:   Received pt supine in bed getting abdominal binder on with OT and RN. Pt's wife, Raymond Hurley present for family education training. PT took over with care. Per OT and RN pt's BP extremely low this morning but is better now (111/58 in supine) after receiving fluids throughout the day. Pt reported feeling urge to use restroom coming on with intentions of getting into WC to get on commode. Pt transferred supine<>sitting EOB with HOB elevated and heavy reliance on bedrails with min A. Pt reported dizziness sitting EOB and BP 79/49. Attempted to assess BP in standing and pt transferred sit<>stand with RW and min A however was unable to stay standing long enough for BP value. Returned to sitting and BP 59/36 with abdominal binder and ted hose on. Immediately returned to supine with mod A for BLE management and repositioned in bed and [facility] from pt's wife. Pillows positioned underneath L buttocks and bilateral knees for comfort and pressure relief. Discussed need to reschedule family education due to low BP and pt's wife  expressed concerns regarding D/C date Thursday with BP values this low. Informed wife that pt will not D/C if not medically stable and will notify rest of treatment team. Pt's wife reported they have a ramp installed and have ordered him a bed that raises legs and head but [place] have bedrails. Therapist informed Raymond Silversmithof pt's reliance on bedrails for getting OOB and options for purchasing bedrail that goes underneath mattress. Briefly discussed HHPT options as pt's wife with good understanding that pt will require follow up therapy services. Pt politely declined any bed level exercises and requested to rest. Concluded session with pt supine in bed, needs within reach, and bed alarm on. RN planning to notify MD of pt's current status and CSW notified to reschedule family education. 42 minutes missed of skilled physical therapy due to low BP and fatigue.   Therapy Documentation Precautions:  Precautions Precautions: Fall,Other (comment) Precaution Comments: Bowel incontinence (apparently had flexiseal removed yesterday) Restrictions Weight Bearing Restrictions: No   Therapy/Group: Individual Therapy AAlfonse AlpersPT, DPT   12/04/2020, 7:35 AM

## 2020-12-04 NOTE — Progress Notes (Signed)
Orthopedic Tech Progress Note Patient Details:  Raymond Hurley 02-07-1931 [id number] RN called requesting an ABDOMINAL BINDER for patient. Handed to RN Ortho Devices Type of Ortho Device: Abdominal binder Ortho Device/Splint Location: stomach Ortho Device/Splint Interventions: Other (comment)   Post Interventions Patient Tolerated: Other (comment) Instructions Provided: Other (comment)   Janit Pagan 12/04/2020, 12:23 PM

## 2020-12-05 DIAGNOSIS — R531 Weakness: Secondary | ICD-10-CM

## 2020-12-05 DIAGNOSIS — K5901 Slow transit constipation: Secondary | ICD-10-CM

## 2020-12-05 DIAGNOSIS — I953 Hypotension of hemodialysis: Secondary | ICD-10-CM

## 2020-12-05 LAB — PREPARE RBC (CROSSMATCH)

## 2020-12-05 LAB — RENAL FUNCTION PANEL
Albumin: 2.2 g/dL — ABNORMAL LOW (ref 3.5–5.0)
Anion gap: 15 (ref 5–15)
BUN: 51 mg/dL — ABNORMAL HIGH (ref 8–23)
CO2: 22 mmol/L (ref 22–32)
Calcium: 8.2 mg/dL — ABNORMAL LOW (ref 8.9–10.3)
Chloride: 97 mmol/L — ABNORMAL LOW (ref 98–111)
Creatinine, Ser: 7.17 mg/dL — ABNORMAL HIGH (ref 0.61–1.24)
GFR, Estimated: 7 mL/min — ABNORMAL LOW (ref >60)
Glucose, Bld: 85 mg/dL (ref 70–99)
Phosphorus: 2.5 mg/dL (ref 2.5–4.6)
Potassium: 3 mmol/L — ABNORMAL LOW (ref 3.5–5.1)
Sodium: 134 mmol/L — ABNORMAL LOW (ref 135–145)

## 2020-12-05 LAB — GLUCOSE, CAPILLARY
Glucose-Capillary: 87 mg/dL (ref 70–99)
Glucose-Capillary: 97 mg/dL (ref 70–99)
Glucose-Capillary: 87 mg/dL (ref 70–99)

## 2020-12-05 LAB — CBC
HCT: 26.2 % — ABNORMAL LOW (ref 39.0–52.0)
Hemoglobin: 7.9 g/dL — ABNORMAL LOW (ref 13.0–17.0)
MCH: 30.5 pg (ref 26.0–34.0)
MCHC: 30.2 g/dL (ref 30.0–36.0)
MCV: 101.2 fL — ABNORMAL HIGH (ref 80.0–100.0)
Platelets: 168 10*3/uL (ref 150–400)
RBC: 2.59 MIL/uL — ABNORMAL LOW (ref 4.22–5.81)
RDW: 25.9 % — ABNORMAL HIGH (ref 11.5–15.5)
WBC: 10 10*3/uL (ref 4.0–10.5)
nRBC: 0 % (ref 0.0–0.2)

## 2020-12-05 MED ORDER — RENA-VITE PO TABS
1.0000 | ORAL_TABLET | Freq: Every day | ORAL | Status: DC
  Administered 2020-12-05 – 2020-12-06 (×2): 1 via ORAL
  Filled 2020-12-05 (×2): qty 1

## 2020-12-05 MED ORDER — HEPARIN SODIUM (PORCINE) 1000 UNIT/ML IJ SOLN
INTRAMUSCULAR | Status: AC
  Filled 2020-12-05: qty 4

## 2020-12-05 MED ORDER — PROSOURCE PLUS PO LIQD
30.0000 mL | Freq: Two times a day (BID) | ORAL | Status: DC
  Administered 2020-12-06 – 2020-12-07 (×3): 30 mL via ORAL
  Filled 2020-12-05 (×3): qty 30

## 2020-12-05 MED ORDER — SODIUM CHLORIDE 0.9% IV SOLUTION: Freq: Once | INTRAVENOUS | Status: DC

## 2020-12-05 MED ORDER — DARBEPOETIN ALFA 200 MCG/0.4ML IJ SOSY
PREFILLED_SYRINGE | INTRAMUSCULAR | Status: AC
  Administered 2020-12-05: 200 ug via INTRAVENOUS
  Filled 2020-12-05: qty 0.4

## 2020-12-05 MED ORDER — POTASSIUM CHLORIDE CRYS ER 20 MEQ PO TBCR
20.0000 meq | EXTENDED_RELEASE_TABLET | Freq: Once | ORAL | Status: AC
  Administered 2020-12-05: 20 meq via ORAL
  Filled 2020-12-05: qty 1

## 2020-12-05 NOTE — Progress Notes (Signed)
Initial Nutrition Assessment  DOCUMENTATION CODES:   Severe malnutrition in context of chronic illness  INTERVENTION:   - Continue Nepro Shake po TID, each supplement provides 425 kcal and 19 grams protein  - Add ProSource Plus 30 ml po BID, each supplement provides 100 kcal and 15 grams of protein  - Downgrade diet to Dysphagia 3 for ease of chewing (per pt's request)  - d/c B-complex with C and order daily renal MVI  - RD will follow-up tomorrow at 2:30 pm to provide in-person diet education for pt's wife  NUTRITION DIAGNOSIS:   Severe Malnutrition related to chronic illness (ESRD on HD) as evidenced by severe muscle depletion,severe fat depletion,percent weight loss (20% weight loss in less than 1 month).  GOAL:   Patient will meet greater than or equal to 90% of their needs  MONITOR:   PO intake,Supplement acceptance,Labs,Weight trends,Skin,I & O's  REASON FOR ASSESSMENT:   Consult Diet education  ASSESSMENT:   87 year old male with PMH of melanoma, vitamin-B12 deficiency, atrial fibrillation, and recent diagnosis of COVID-19 with diarrhea and poor PO intake. Pt was admitted on 10/14/20 with acute right facial droop, dysarthria, and difficulty following commands. CT abdomen showed multifocal pneumonia and severe inflammatory changes around pancreas consistent with severe pancreatitis. Pt required CVVHD and has now been transitioned to HD. Admitted to CIR on 1/28.   Last HD on 12/02/20 with 600 ml net UF.  LCSW contacted RD to request RD speak with pt's wife to answer diet questions prior to pt's discharge. RD spoke with pt's wife via phone call. Pt's wife not planning on coming into the hospital today but will be here tomorrow and requests RD meet with her at 2:30 pm to answer questions.  Spoke with pt at bedside. He reports a decreased appetite. Pt endorses nausea and feelings of early satiety. He states that he is eating the best that he can. Discussed importance of  adequate kcal and protein intake to prevent additional weight loss and promote lean muscle mass maintenance. Pt expresses difficulty consuming meats that he has received as they have been too tough to cut and chew. RD offered to downgrade diet to dysphagia 3 for ease of intake and to allow for energy conservation when eating. Pt very eager to try this and very appreciative of RD offer. Diet downgraded to dysphagia 3.  Pt states that he is drinking some of the Nepro shakes. Per review of notes, pt often consumes only half of the shake. RD encouraged pt to consume shakes between means to increase kcal and protein intake. Pt agreeable. RD will also add ProSource Plus to increase protein intake and renal MVI.  Pt reports sensation of food "getting stuck" in his throat when eating. Noted SLP has evaluated pt and regular textures with thin liquids recommended.  CIR admission weight: 92 kg Current weight: 72.1 kg  Reviewed weight history in chart. Pt weighed 90.1 kg on 11/10/20. Current weight is 72.1 kg. This is an 18 kg weight loss in less than 1 month. This is a 20% weight loss which is severe and significant for timeframe.  Pt has been started on Megace by MD.  Meal Completion: 0-90%  Medications reviewed and include: B-complex with vitamin C, aranesp, colace, Nepro shake TID, lactinex, megace, rena-vit, sucralfate  Labs reviewed: potassium 3.0, sodium 134, hemoglobin 7.9 CBG's: 85-103 x 24 hours  NUTRITION - FOCUSED PHYSICAL EXAM:  Flowsheet Row Most Recent Value  Orbital Region Severe depletion  Upper Arm Region Unable  to assess  Thoracic and Lumbar Region Severe depletion  Buccal Region Severe depletion  Temple Region Severe depletion  Clavicle Bone Region Severe depletion  Clavicle and Acromion Bone Region Severe depletion  Scapular Bone Region Unable to assess  Dorsal Hand Moderate depletion  Patellar Region Severe depletion  Anterior Thigh Region Severe depletion  Posterior Calf  Region Severe depletion  Edema (RD Assessment) None  Hair Reviewed  Eyes Reviewed  Mouth Reviewed  Skin Reviewed  Nails Reviewed       Diet Order:   Diet Order            DIET DYS 3 Room service appropriate? Yes; Fluid consistency: Thin; Fluid restriction: 1200 mL Fluid  Diet effective now                 EDUCATION NEEDS:   Education needs have been addressed  Skin:  Skin Assessment: Skin Integrity Issues: Unstageable: buttocks (received hydrotherapy)  Last BM:  12/05/20 large type 5  Height:   Ht Readings from Last 1 Encounters:  11/10/20 5\' 10"  (1.778 m)    Weight:   Wt Readings from Last 1 Encounters:  12/03/20 72.1 kg    BMI:  Body mass index is 22.81 kg/m.  Estimated Nutritional Needs:   Kcal:  2000-2200  Protein:  115-130 grams  Fluid:  1000 ml + UOP    Gustavus Bryant, MS, RD, LDN Inpatient Clinical Dietitian Please see AMiON for contact information.

## 2020-12-05 NOTE — Progress Notes (Signed)
Patient ID: Raymond Scales., male   DOB: 03/13/1931, 87 y.o.   MRN: [id number]  This NP reviewed medical records,  discussed case with team.     Patient is currently in CIR and working well with therapies. He is  HD dependent, frail and with multiple co-morbidites, high risk to decompensate 2/2 to multiple co-morbidites.  I met at patient's bedside, he is weak today and tells me "my BP is low and it it makes me feel terrible".. He is alert and oriented.    Wife at bedside ( this is the first time we have met in person).  Becky LCSW also at bedside.   She verbalizes concerns for transition home, "he needs to be in better condition than this"  Education offered regarding frailty and a body's ability to bounce back 2/2 to multiple co-morbid ites.  ( ESRD, age, cardiac disease, frailty)   Education offered on the importance of conversation and documentation of advanced care planning.  No ACP documents at this time.   MOST form introduced.  Emotional support offered.     Questions and concerns addressed   I will continue to follow up with Raymond Hurley intermittently during his rehab stay. I encouraged him to call will questions or concerns.   Total time spent on the unit was 35 minutes  Greater than 50% of the time was spent in counseling and coordination of care  Wadie Lessen NP  Palliative Medicine Team Team Phone # [phone] Pager [phone]

## 2020-12-05 NOTE — Procedures (Signed)
Seen and examined on dialysis.  Procedure supervised. Blood pressure 104/56 and HR 83 on HD.  RIJ tunn catheter. Tolerating goal.  Awaiting PRBC.  Goal UF net even to 0.5 kg as tolerated  Claudia Desanctis, MD 12/05/2020 4:32 PM

## 2020-12-05 NOTE — Progress Notes (Signed)
Occupational Therapy Session Note  Patient Details  Name: Raymond Hurley. MRN: [id number] Date of Birth: [birth year]  Today's Date: 12/05/2020 OT Individual Time: 7829-5621 OT Individual Time Calculation (min): 56 min    Short Term Goals: Week 4:  OT Short Term Goal 1 (Week 4): STG = LTGs due to remaining LOS  Skilled Therapeutic Interventions/Progress Updates:    Treatment session with focus on self-care retraining, functional mobility, and dynamic standing balance.  Pt received supine in bed with MD present assessing wound on buttocks.  Pt with reports of increased weakness and fatigue, therapist noted pt with increased coughing.  Pt required increased assistance for bed mobility with min assist to come to sitting EOB.  Pt completed sit > stand min assist from elevated EOB.  Pt completed all stand pivot transfers throughout session bed <> w/c and w/c <> BSC over toilet with CGA to Min assist.  Pt completed clothing management with mod assist with increased cues and physical assistance for LB dressing with use of AE.  Pt overall reports decreased endurance and weakness with all mobility and toileting this session.  Post toileting, pt returned to upright in w/c in room to engage in self-feeding as breakfast tray arrived late.    BP assessed prior to exiting bed then again after sitting upright a few mins, see vitals section in flowsheet.  Therapy Documentation Precautions:  Precautions Precautions: Fall,Other (comment) Precaution Comments: Bowel incontinence (apparently had flexiseal removed yesterday) Restrictions Weight Bearing Restrictions: No General:   Vital Signs: Therapy Vitals Pulse Rate: 92 BP: (!) 95/56 Patient Position (if appropriate): Sitting Oxygen Therapy SpO2: 95 % O2 Device: Room Air Pain:  Pt with no c/o pain   Therapy/Group: Individual Therapy  [name] 12/05/2020, 12:02 PM

## 2020-12-05 NOTE — Progress Notes (Signed)
Physical Therapy Session Note  Patient Details  Name: Raymond Hurley. MRN: [id number] Date of Birth: June 08, 1931  Today's Date: 12/05/2020 PT Individual Time: 1001-1054 PT Individual Time Calculation (min): 53 min   Short Term Goals: Week 2:  PT Short Term Goal 1 (Week 2): Pt will perform stand<>pivot transfer with LRAD and CGA PT Short Term Goal 1 - Progress (Week 2): Met PT Short Term Goal 2 (Week 2): Pt will ambulate 30f with LRAD and min A PT Short Term Goal 2 - Progress (Week 2): Met PT Short Term Goal 3 (Week 2): Pt will perform bed mobility with CGA consistantly PT Short Term Goal 3 - Progress (Week 2): Progressing toward goal Week 3:  PT Short Term Goal 1 (Week 3): STG=LTG due to LOS  Skilled Therapeutic Interventions/Progress Updates:   Received pt sitting in WC, pt agreeable to therapy, and denied any pain during session. Session with emphasis on functional mobility/transfers, generalized strengthening, dynamic sitting balance/coordinaiton, and improved activity tolerance. BP sitting in WC 81/48, pt denied any dizziness but reported feeling weak. RN made aware and reported pt's BP numbers have been like this all morning. Encouraged pt to put on abdominal binder, however pt politely refused stating that is cuts off his circulation and prevents him from expanding his lungs. No standing done during session due to safety concerns of low BP values and fatigue from previous therapies. [facility] and performed BUE strengthening on UBE at level 1 for 2 minutes forward and 2.5 minutes backward with 1 extended rest break in between. Pt reported increased UE fatigue and difficulty with activity. Pt reported 7/10 fatigue after activity but agreeable to attempt seated exercises. Worked on lateral and anterior weight shifts and educated pt on importance of pressure relief for improved wound healing; pt verbalized and demonstrated  understanding. [facility] performed the following exercises [facility] and verbal cues for technique: -bicep curls with 1lb dowel 2x10 -horizontal chest press at 90 degrees with 1lb dowel 3x8 -hip adduction pillow squeezes 2x10 Pt reported feeling "exhausted" and stated "that's probably enough". Pt required multiple extended rest breaks throughout session due to fatigue and SOB. [facility] and requested to sit at sink and brush teeth and wash face. Pt able to complete tasks with set up assist and significantly increased time due to fatigue. Concluded session with pt sitting in WC at sink, needs within reach, and seatbelt alarm on.   Therapy Documentation Precautions:  Precautions Precautions: Fall,Other (comment) Precaution Comments: Bowel incontinence (apparently had flexiseal removed yesterday) Restrictions Weight Bearing Restrictions: No  Therapy/Group: Individual Therapy AAlfonse AlpersPT, DPT   12/05/2020, 7:21 AM

## 2020-12-05 NOTE — Progress Notes (Signed)
Patient ID: Raymond Hurley., male   DOB: Aug 22, 1931, 87 y.o.   MRN: [id number] [place] with wife to discuss unsure if BP's will delay pt's discharge. Need to re-schedule education will try tomorrow at 3:00 since she is meeting with dietician at 2;30. Wife will be here tomorrow at 2:30 and will see if can get education completed for discharge home.

## 2020-12-05 NOTE — Progress Notes (Signed)
[facility] PHYSICAL MEDICINE & REHABILITATION PROGRESS NOTE   Subjective/Complaints: Patient seen sitting up in bed this morning.  He is about to work with therapies.  He is using supplemental oxygen, discussed with therapies and nursing.  Patient noted to be wearing abdominal binder in bed-discussed with nursing.  Yesterday, patient with hypotension and fluid bolus ordered.  He was also seen by nephrology, notes reviewed-no changes.  ROS: Denies CP, SOB, N/V/D.  Objective:   No results found. Recent Labs    12/02/20 1853  WBC 11.0*  HGB 7.5*  HCT 24.9*  PLT 179   Recent Labs    12/02/20 1854 12/05/20 0535  NA 136 134*  K 3.2* 3.0*  CL 97* 97*  CO2 21* 22  GLUCOSE 107* 85  BUN 57* 51*  CREATININE 7.99* 7.17*  CALCIUM 8.7* 8.2*    Intake/Output Summary (Last 24 hours) at 12/05/2020 5852 Last data filed at 12/04/2020 1624 Gross per 24 hour  Intake 239.28 ml  Output --  Net 239.28 ml     Pressure Injury 11/10/20 Buttocks Right;Mid Unstageable - Full thickness tissue loss in which the base of the injury is covered by slough (yellow, tan, gray, green or brown) and/or eschar (tan, brown or black) in the wound bed. red open area of partial s (Active)  11/10/20 1721  Location: Buttocks  Location Orientation: Right;Mid  Staging: Unstageable - Full thickness tissue loss in which the base of the injury is covered by slough (yellow, tan, gray, green or brown) and/or eschar (tan, brown or black) in the wound bed.  Wound Description (Comments): red open area of partial skin loss with eschar covering wound bed  Present on Admission: Yes    Physical Exam: Vital Signs Blood pressure (!) 95/56, pulse 92, temperature 97.6 F (36.4 C), resp. rate 20, height 5\' 10"  (1.778 m), weight 72.1 kg, SpO2 95 %.  Constitutional: No distress . Vital signs reviewed. HENT: Normocephalic.  Atraumatic. Eyes: EOMI. No discharge. Cardiovascular: No JVD.  RRR. Respiratory: Normal effort.  No  stridor.  Bilateral clear to auscultation.  + [place]. GI: Non-distended.  BS +. Skin: Warm and dry.   Coccyx ulcer with pink granulation tissue. Psych: Normal mood.  Normal behavior. Musc: Bilateral lower extremity edema, improved. No tenderness in extremities. Neuro: Alert Motor: Bilateral upper extremities: 4+/5 throughout, stable Left lower extremity: Hip flexion, knee extension 4-/5, ankle dorsiflexion 4+/5, stable Right lower extremity: Hip flexion, knee extension 3 +-4-/5, ankle dorsiflexion 4+/5, stable  Assessment/Plan: 1. Functional deficits which require 3+ hours per day of interdisciplinary therapy in a comprehensive inpatient rehab setting.  Physiatrist is providing close team supervision and 24 hour management of active medical problems listed below.  Physiatrist and rehab team continue to assess barriers to discharge/monitor patient progress toward functional and medical goals  Care Tool:  Bathing    Body parts bathed by patient: Right arm,Left arm,Chest,Abdomen,Face,Front perineal area   Body parts bathed by helper: Buttocks     Bathing assist Assist Level: Minimal Assistance - Patient > 75%     Upper Body Dressing/Undressing Upper body dressing   What is the patient wearing?: Pull over shirt,Button up shirt    Upper body assist Assist Level: Supervision/Verbal cueing    Lower Body Dressing/Undressing Lower body dressing      What is the patient wearing?: Pants,Incontinence brief     Lower body assist Assist for lower body dressing: Moderate Assistance - Patient 50 - 74%     Toileting Toileting  Toileting assist Assist for toileting: Moderate Assistance - Patient 50 - 74%     Transfers Chair/bed transfer  Transfers assist     Chair/bed transfer assist level: Contact Guard/Touching assist Chair/bed transfer assistive device: Armrests,Walker   Locomotion Ambulation   Ambulation assist   Ambulation activity did not occur: Safety/medical  concerns  Assist level: Contact Guard/Touching assist Assistive device: Walker-rolling Max distance: 21ft   Walk 10 feet activity   Assist  Walk 10 feet activity did not occur: Safety/medical concerns  Assist level: Contact Guard/Touching assist Assistive device: Walker-rolling   Walk 50 feet activity   Assist Walk 50 feet with 2 turns activity did not occur: Safety/medical concerns         Walk 150 feet activity   Assist Walk 150 feet activity did not occur: Safety/medical concerns         Walk 10 feet on uneven surface  activity   Assist Walk 10 feet on uneven surfaces activity did not occur: Safety/medical concerns         Wheelchair     Assist Will patient use wheelchair at discharge?: Yes Type of Wheelchair: Manual    Wheelchair assist level: Supervision/Verbal cueing Max wheelchair distance: 180ft    Wheelchair 50 feet with 2 turns activity    Assist        Assist Level: Supervision/Verbal cueing   Wheelchair 150 feet activity     Assist      Assist Level: Supervision/Verbal cueing     Medical Problem List and Plan: 1.  Generalized weakness affecting ability to stand or complete ADL tasks secondary to debility.  Continue CIR  Previously discussed with wife plans for discharge and goals of care 2.  Antithrombotics: -DVT/anticoagulation:  Mechanical: Sequential compression devices, below knee Bilateral lower extremities due to GI bleed.             -antiplatelet therapy: N/A 3. Pain Management: Tylenol prn.   Controlled with meds on 2/22 4. Mood: LCSW to follow for evaluation and support.              -antipsychotic agents: N/a 5. Neuropsych: This patient is capable of making decisions on his own behalf. 6. Skin/Wound Care: Routine pressure relief measures.   Wound looks good Wet to dry with Dakins solution covered by foamn dressing   -patient says he is not being repositioned every 2H, placed nursing order to request  this. 7. Fluids/Electrolytes/Nutrition: Strict I/Os.  Labs with HD.discussed 1283ml fluid restriction . Megace started to stimulate appetite.  8.  ESRD: On midodrine prior to HD to help support BP.   Permanent access as outpatient  Appreciate nephro recs 9. Chronic systolic CHF/CAF: Strict I/Os. Fluid status managed with HD.              Daily weights  Lasix per Nephro, appreciate Recs  Amio per Cards recs  Filed Weights   12/01/20 0610 12/02/20 0537 12/03/20 0510  Weight: 75.8 kg 73.9 kg 72.1 kg   Decreased on 2/20, no repeat weights since that time 10.  Pancreatitis: Has completed antibiotic course. Tolerating diet without nausea, diarrhea or abdominal pain.              Monitor closely.  Tolerating diet at present, variable p.o. intake 11. LGIB: Continue Carafate ac/hs.  12. Acute on chronic anemia: On aranresp weekly.              Labs with HD, transfused on 2/5  Hemoglobin 7.5 on 2/19 13.  Thrombocytopenia: Monitor for signs of bleeding.              Labs with HD  Resolved 14. Acute respiratory failure: Has resolved.   See #16 15.  Leukocytosis-some steroid effect  WBCs 11.0 on 2/19  Afebrile 16.  Steroid-induced hyperglycemia: Resolved  SSI  Prednisone decreased to 20 on 2/4, decreased to 10 on 2/9, decreased to 5 on 2/15, DC'd on 2/19  Relatively controlled on 2/18, DC'd CBG checks   17.  Dizziness  Likely due to anemia and soft blood pressures and limited p.o. intake in accordance with HD, patient not drinking nearly up to 1200cc fluid restriction  Trying to drink more, discussed again  Continue midodrine 18. Slow transit constipation:  Improving  LOS: 25 days A FACE TO FACE EVALUATION WAS PERFORMED  Torin Modica Lorie Phenix 12/05/2020, 9:09 AM

## 2020-12-05 NOTE — Progress Notes (Signed)
Physical Therapy Session Note  Patient Details  Name: Raymond Hurley. MRN: [id number] Date of Birth: 1931-03-16  Today's Date: 12/05/2020 PT Individual Time: 0900-0956 PT Individual Time Calculation (min): 56 min   Short Term Goals: Week 3:  PT Short Term Goal 1 (Week 3): STG=LTG due to LOS  Skilled Therapeutic Interventions/Progress Updates:    Pt received sitting upright in w/c, awake and agreeable to therapy but reports moderate fatigue from prior therapy session and poor nights rest. BP assessed on arrival, reading 84/53 (64) HR 92 SpO2 96% on RA. Donned abdominal binder with totalA for energy conservation and reassesed BP, reading 91/54 (65) HR 95. Pt complains of discomfort with tightness of abdominal binder and required multiple efforts to improve this however due to his body habitus (pear shaped abdomen/trunk). Pt propelled himself, ~136ft, from his room to day room gym in his w/c, using BUE's only, completed with supervision. Required extended rest break due to fatigue. Removed abdominal binder for completion of seated there-ex, 1x20 LAQ + 1x20 hip marches (bilatreally, unweighted). Pt reports a 15/20 (hard - heavy) on the BORG Perceived exertion scale after doing the seated there-ex. Completed Kinetron at workload of 50cm/sec while seated in w/c, required maxA for positioning feet onto the peddles. He reported a 13/20 (somewhat hard) on BORG prior to starting and reported a 17/20 (very hard - very strenuous and very fatigued) after 62min25sec into the exercise and then completed an additional 96min30sec + 41min30sec with a reported 17/20 on the BORG scale each time. Completed seated ball toss using lightweight foam tennis balls and velcro catcher, working on cardiovascular endurance, UE coordination, and improving activity tolerance. Pt then returned to his room and remained seated in w.c with safety belt alarm on, needs within reach, abdominal binder doffed throughout majority of  session.  Therapy Documentation Precautions:  Precautions Precautions: Fall,Other (comment) Precaution Comments: Bowel incontinence (apparently had flexiseal removed yesterday) Restrictions Weight Bearing Restrictions: No  Therapy/Group: Individual Therapy  Dashana Guizar P Rayah Fines PT 12/05/2020, 7:39 AM

## 2020-12-05 NOTE — Progress Notes (Signed)
[facility] ROUNDING NOTE   Subjective:   Spoke with patient and his wife at bedside.  We discussed risks/benefits/indications for blood transfusion and he does consent to PRBC's.  His wife hopes this will help with this strength.    Review of systems:  Has been weak His wife doesn't think he's eating well  Denies overt shortness of breath - has had low dose oxygen on No n/v --------  Brief history: This is a 87 year old gentleman who remained anuric oliguric following Covid infection.  He is now CIR requiring intensive physical therapy.  His baseline creatinine is 1.2.  However he has been dialysis dependent since admission and is now on a Tuesday Thursday Saturday dialysis schedule.  There is no recorded urine output.  Patient undergoing CLIP.  No definitive chair yet.   Objective:  Vital signs in last 24 hours:  Temp:  [97.6 F (36.4 C)-98.4 F (36.9 C)] 97.7 F (36.5 C) (02/22 1316) Pulse Rate:  [78-95] 86 (02/22 1316) Resp:  [15-20] 15 (02/22 1316) BP: (78-112)/(45-61) 98/59 (02/22 1316) SpO2:  [92 %-100 %] 97 % (02/22 1316)  Weight change:  Filed Weights   12/01/20 0610 12/02/20 0537 12/03/20 0510  Weight: 75.8 kg 73.9 kg 72.1 kg    Intake/Output: I/O last 3 completed shifts: In: 239.3 [IV Piggyback:239.3] Out: -    Intake/Output this shift:  No intake/output data recorded.  General:  lying in bed in NAD Heart:S1S2 no rub Lungs: clear and reduced; unlabored at rest on 2 liters Abdomen:soft, Non-tender, non-distended Extremities: no edema  Neuro - alert and oriented x 3 provides hx and follows commands Psych normal mood and affect Dialysis Access: Right IJ TDC in place    Basic Metabolic Panel: Recent Labs  Lab 11/28/20 1601 12/02/20 1854 12/05/20 0535  NA 131* 136 134*  K 3.7 3.2* 3.0*  CL 94* 97* 97*  CO2 20* 21* 22  GLUCOSE 125* 107* 85  BUN 71* 57* 51*  CREATININE 8.45* 7.99* 7.17*  CALCIUM 8.3* 8.7* 8.2*  PHOS 2.8 2.4* 2.5     Liver Function Tests: Recent Labs  Lab 11/28/20 1601 12/02/20 1854 12/05/20 0535  ALBUMIN 2.5* 2.5* 2.2*   No results for input(s): LIPASE, AMYLASE in the last 168 hours. No results for input(s): AMMONIA in the last 168 hours.  CBC: Recent Labs  Lab 11/28/20 1601 12/02/20 1853 12/05/20 1310  WBC 10.3 11.0* 10.0  HGB 7.0* 7.5* 7.9*  HCT 22.9* 24.9* 26.2*  MCV 100.0 103.3* 101.2*  PLT 210 179 168    Cardiac Enzymes: No results for input(s): CKTOTAL, CKMB, CKMBINDEX, TROPONINI in the last 168 hours.  BNP: Invalid input(s): POCBNP  CBG: Recent Labs  Lab 12/04/20 1148 12/04/20 1618 12/04/20 2122 12/05/20 0607 12/05/20 1126  GLUCAP 93 85 103* 87 97    Microbiology: Results for orders placed or performed during the hospital encounter of 10/14/20  Resp Panel by RT-PCR (Flu A&B, Covid) Nasopharyngeal Swab     Status: Abnormal   Collection Time: 10/14/20  2:21 AM   Specimen: Nasopharyngeal Swab; Nasopharyngeal(NP) swabs in vial transport medium  Result Value Ref Range Status   SARS Coronavirus 2 by RT PCR POSITIVE (A) NEGATIVE Final    Comment: RESULT CALLED TO, READ BACK BY AND VERIFIED WITH: G CERER RN 10/14/20 0349 JDW (NOTE) SARS-CoV-2 target nucleic acids are DETECTED.  The SARS-CoV-2 RNA is generally detectable in upper respiratory specimens during the acute phase of infection. Positive results are indicative of the presence  of the identified virus, but do not rule out bacterial infection or co-infection with other pathogens not detected by the test. Clinical correlation with patient history and other diagnostic information is necessary to determine patient infection status. The expected result is Negative.  Fact Sheet for Patients: EntrepreneurPulse.com.au  Fact Sheet for Healthcare Providers: IncredibleEmployment.be  This test is not yet approved or cleared by the [place] FDA and  has been authorized for  detection and/or diagnosis of SARS-CoV-2 by FDA under an Emergency Use Authorization (EUA).  This EUA will remain in effect (meaning this test can be used)  for the duration of  the COVID-19 declaration under Section 564(b)(1) of the Act, 21 U.S.C. section 360bbb-3(b)(1), unless the authorization is terminated or revoked sooner.     Influenza A by PCR NEGATIVE NEGATIVE Final   Influenza B by PCR NEGATIVE NEGATIVE Final    Comment: (NOTE) The Xpert Xpress SARS-CoV-2/FLU/RSV plus assay is intended as an aid in the diagnosis of influenza from Nasopharyngeal swab specimens and should not be used as a sole basis for treatment. Nasal washings and aspirates are unacceptable for Xpert Xpress SARS-CoV-2/FLU/RSV testing.  Fact Sheet for Patients: EntrepreneurPulse.com.au  Fact Sheet for Healthcare Providers: IncredibleEmployment.be  This test is not yet approved or cleared by the [place] FDA and has been authorized for detection and/or diagnosis of SARS-CoV-2 by FDA under an Emergency Use Authorization (EUA). This EUA will remain in effect (meaning this test can be used) for the duration of the COVID-19 declaration under Section 564(b)(1) of the Act, 21 U.S.C. section 360bbb-3(b)(1), unless the authorization is terminated or revoked.  Performed at [facility], [place] [address], [place], [place] [address]   Culture, blood (routine x 2)     Status: None   Collection Time: 10/14/20 10:36 AM   Specimen: BLOOD RIGHT HAND  Result Value Ref Range Status   Specimen Description BLOOD RIGHT HAND  Final   Special Requests   Final    BOTTLES DRAWN AEROBIC AND ANAEROBIC Blood Culture adequate volume   Culture   Final    NO GROWTH 5 DAYS Performed at [facility], [place] [address] [place], [place] [address]    Report Status 10/19/2020 FINAL  Final  Culture, blood (routine x 2)     Status: None   Collection Time: 10/14/20 10:36 AM   Specimen:  BLOOD LEFT HAND  Result Value Ref Range Status   Specimen Description BLOOD LEFT HAND  Final   Special Requests   Final    BOTTLES DRAWN AEROBIC AND ANAEROBIC Blood Culture results may not be optimal due to an inadequate volume of blood received in culture bottles   Culture   Final    NO GROWTH 5 DAYS Performed at [facility], [place] [address], [place],  [address]    Report Status 10/19/2020 FINAL  Final  Gastrointestinal Panel by PCR , Stool     Status: None   Collection Time: 10/14/20 11:00 AM   Specimen: Stool  Result Value Ref Range Status   Campylobacter species NOT DETECTED NOT DETECTED Final   Plesimonas shigelloides NOT DETECTED NOT DETECTED Final   Salmonella species NOT DETECTED NOT DETECTED Final   Yersinia enterocolitica NOT DETECTED NOT DETECTED Final   Vibrio species NOT DETECTED NOT DETECTED Final   Vibrio cholerae NOT DETECTED NOT DETECTED Final   Enteroaggregative E coli (EAEC) NOT DETECTED NOT DETECTED Final   Enteropathogenic E coli (EPEC) NOT DETECTED NOT DETECTED  Final   Enterotoxigenic E coli (ETEC) NOT DETECTED NOT DETECTED Final   Shiga like toxin producing E coli (STEC) NOT DETECTED NOT DETECTED Final   Shigella/Enteroinvasive E coli (EIEC) NOT DETECTED NOT DETECTED Final   Cryptosporidium NOT DETECTED NOT DETECTED Final   Cyclospora cayetanensis NOT DETECTED NOT DETECTED Final   Entamoeba histolytica NOT DETECTED NOT DETECTED Final   Giardia lamblia NOT DETECTED NOT DETECTED Final   Adenovirus F40/41 NOT DETECTED NOT DETECTED Final   Astrovirus NOT DETECTED NOT DETECTED Final   Norovirus GI/GII NOT DETECTED NOT DETECTED Final   Rotavirus A NOT DETECTED NOT DETECTED Final   Sapovirus (I, II, IV, and V) NOT DETECTED NOT DETECTED Final    Comment: Performed at [facility], [address] [place], [place] [address]  C Difficile Quick Screen w PCR reflex     Status: None   Collection Time: 10/14/20 11:00 AM   Specimen: STOOL  Result  Value Ref Range Status   C Diff antigen NEGATIVE NEGATIVE Final   C Diff toxin NEGATIVE NEGATIVE Final   C Diff interpretation No C. difficile detected.  Final    Comment: Performed at [facility], [place] [address], [place], [place] [address]  MRSA PCR Screening     Status: None   Collection Time: 10/15/20 12:21 PM   Specimen: Nasal Mucosa; Nasopharyngeal  Result Value Ref Range Status   MRSA by PCR NEGATIVE NEGATIVE Final    Comment:        The GeneXpert MRSA Assay (FDA approved for NASAL specimens only), is one component of a comprehensive MRSA colonization surveillance program. It is not intended to diagnose MRSA infection nor to guide or monitor treatment for MRSA infections. Performed at [facility], [place] [address], [place], [place] [address]   Culture, blood (Routine X 2) w Reflex to ID Panel     Status: None   Collection Time: 10/20/20  4:40 PM   Specimen: BLOOD RIGHT HAND  Result Value Ref Range Status   Specimen Description BLOOD RIGHT HAND  Final   Special Requests   Final    BOTTLES DRAWN AEROBIC AND ANAEROBIC Blood Culture adequate volume   Culture   Final    NO GROWTH 5 DAYS Performed at [facility], [place] [address], [place], [place] [address]    Report Status 10/25/2020 FINAL  Final  Culture, blood (Routine X 2) w Reflex to ID Panel     Status: None   Collection Time: 10/20/20  4:43 PM   Specimen: BLOOD  Result Value Ref Range Status   Specimen Description BLOOD LEFT ANTECUBITAL  Final   Special Requests   Final    BOTTLES DRAWN AEROBIC ONLY Blood Culture results may not be optimal due to an inadequate volume of blood received in culture bottles   Culture   Final    NO GROWTH 5 DAYS Performed at [facility], [place] [address] [place], [place] [address]    Report Status 10/25/2020 FINAL  Final  MRSA PCR Screening     Status: None   Collection Time: 10/21/20 10:08 AM   Specimen: Nasal Mucosa; Nasopharyngeal  Result Value Ref Range  Status   MRSA by PCR NEGATIVE NEGATIVE Final    Comment:        The GeneXpert MRSA Assay (FDA approved for NASAL specimens only), is one component of a comprehensive MRSA colonization surveillance program. It is not intended to diagnose MRSA infection nor  to guide or monitor treatment for MRSA infections. Performed at [facility], [place] [address], [place], [place] [address]     Coagulation Studies: No results for input(s): LABPROT, INR in the last 72 hours.  Urinalysis: No results for input(s): COLORURINE, LABSPEC, PHURINE, GLUCOSEU, HGBUR, BILIRUBINUR, KETONESUR, PROTEINUR, UROBILINOGEN, NITRITE, LEUKOCYTESUR in the last 72 hours.  Invalid input(s): APPERANCEUR    Imaging: No results found.   Medications:   . sodium chloride     . (feeding supplement) PROSource Plus  30 mL Oral BID BM  . sodium chloride   Intravenous Once  . sodium chloride   Intravenous Once  . amiodarone  200 mg Oral BID   Followed by  . [START ON 12/08/2020] amiodarone  200 mg Oral Daily  . Chlorhexidine Gluconate Cloth  6 each Topical Q0600  . Chlorhexidine Gluconate Cloth  6 each Topical Q0600  . darbepoetin (ARANESP) injection - DIALYSIS  200 mcg Intravenous Q Tue-HD  . docusate  100 mg Oral BID  . feeding supplement (NEPRO CARB STEADY)  237 mL Oral TID WC  . Gerhardt's butt cream   Topical TID  . lactobacillus acidophilus & bulgar  1 tablet Oral TID WC  . lidocaine  1 patch Transdermal Q24H  . mouth rinse  15 mL Mouth Rinse BID  . megestrol  40 mg Oral Daily  . midodrine  10 mg Oral TID WC  . multivitamin  1 tablet Oral QHS  . sucralfate  1 g Oral TID WC & HS  . triamcinolone 0.1 % cream : eucerin   Topical BID   acetaminophen, bisacodyl, calcium carbonate, chlorpheniramine-HYDROcodone, diphenhydrAMINE, loperamide, polyethylene glycol, prochlorperazine **OR** prochlorperazine **OR** prochlorperazine, Resource ThickenUp Clear, traZODone  Assessment/ Plan:  1. Acute kidney Injury  now considered ESRD: Baseline creatinine 1.2 with severe ATN related to Covid and acute pancreatitis.  No signs of recovery at this time.  Planning for permanent access as an outpatient once his strength is improved per charting.  - Continue HD on TTS schedule - has a spot at TTS at [facility] once discharged   - got low dose of K supplement this AM  2.Acute pancreatitis:This was noted on CT scan earlier (1/3 and 1/7). 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  Now status post treatment  3. Acute hypoxic respiratory failure:Secondary to Covid pneumonia and possible aspiration pneumonitis.  Continue supplemental oxygen as needed  4. Hypotension - with dizziness. No/minimal UF with HD on 2/22 planned.  On midodrine.  PRBC's as below  5. Anemia: Secondary to acute/critical illness and GI losses.on aranesp at 200 mcg every tuesday.  IV iron prohibitive because of very high ferritin level.  PRBC's for 2/22 with HD  6 Secondary hyperparathyroidism:stop sevelamer - hypophos. PTH level 217 at goal.   Claudia Desanctis, MD 12/05/2020 3:23 PM

## 2020-12-06 DIAGNOSIS — E43 Unspecified severe protein-calorie malnutrition: Secondary | ICD-10-CM | POA: Insufficient documentation

## 2020-12-06 DIAGNOSIS — R1310 Dysphagia, unspecified: Secondary | ICD-10-CM

## 2020-12-06 LAB — GLUCOSE, CAPILLARY
Glucose-Capillary: 83 mg/dL (ref 70–99)
Glucose-Capillary: 118 mg/dL — ABNORMAL HIGH (ref 70–99)
Glucose-Capillary: 119 mg/dL — ABNORMAL HIGH (ref 70–99)
Glucose-Capillary: 118 mg/dL — ABNORMAL HIGH (ref 70–99)

## 2020-12-06 LAB — PREPARE RBC (CROSSMATCH)

## 2020-12-06 MED ORDER — SODIUM CHLORIDE 0.9% IV SOLUTION: Freq: Once | INTRAVENOUS | Status: DC

## 2020-12-06 MED ORDER — CHLORHEXIDINE GLUCONATE CLOTH 2 % EX PADS
6.0000 | MEDICATED_PAD | Freq: Every day | CUTANEOUS | Status: DC
  Administered 2020-12-07: 6 via TOPICAL

## 2020-12-06 NOTE — Progress Notes (Signed)
Nutrition Education Note  Met with pt's wife in room at scheduled time of 2:30 pm to provide diet education and answer diet-related questions prior to pt's scheduled discharge tomorrow. Pt's wife had received multiple handouts and booklets regarding the renal diet, foods to avoid, etc.  Please see pt's latest potassium and phosphorus labs below. Potassium and phosphorus have both been trending on the low side of normal or have been low over the last week.  Lab Results  Component Value Date   K 3.0 (L) 12/05/2020   Lab Results  Component Value Date   CALCIUM 8.2 (L) 12/05/2020   PHOS 2.5 (considered low for dialysis patient) 12/05/2020   Discussed with pt's wife that because pt is severely malnourished, has lost a significant amount of weight, is not eating much, and his potassium and phosphorus are considered low for a dialysis patient, it would be appropriate to allow consumption of higher potassium and phosphorus foods at this time. Encouraged pt's wife to make sure that pt has close follow-up with outpatient HD center dietitian as diet recommendations can change based on pt's lab values.  Pt with multiple questions regarding specific foods and food groups. Reiterated to pt's wife that at this time it would be best for pt to have a liberalized diet and NOT to restrict foods that pt enjoys even if they are included on the high-potassium and high-phosphorus foods lists that she received previously. RD provided multiple handouts from [place] website regarding increasing kcal and protein in pt's diet in a kidney-friendly manner.  RD also provided "High-Calorie High-Protein Nutrition Therapy" handout from the Academy of Nutrition and Dietetics. Provided examples on ways to increase caloric density of foods and beverages frequently consumed by the patient. Also provided ideas to promote variety and to incorporate additional nutrient dense foods into patient's diet. Discussed eating small frequent meals  and snacks to assist in increasing overall PO intake. Teach back method used.  Expect good compliance.  Please see Initial Nutrition Assessment from 2/22 for additional information. Nutrition interventions are in place. RD will continue to follow pt during admission.    Gustavus Bryant, MS, RD, LDN Inpatient Clinical Dietitian Please see AMiON for contact information.

## 2020-12-06 NOTE — Progress Notes (Addendum)
[facility] ROUNDING NOTE   Subjective:  Last HD on 2/22 with keep even goal.  no uop charted.  States he felt better after getting the blood.  No trouble with HD.  Still feels weak and does agree to another unit.  States they are planning to send him home tomorrow.     Review of systems:  Has been weak Denies overt shortness of breath - has had low dose oxygen on - now off No n/v States not eating well  --------  Brief history: This is a 87 year old gentleman who remained anuric oliguric following Covid infection.  He is now CIR requiring intensive physical therapy.  His baseline creatinine is 1.2.  However he has been dialysis dependent since admission and is now on a Tuesday Thursday Saturday dialysis schedule.  There is no recorded urine output.  Patient undergoing CLIP.  No definitive chair yet.   Objective:  Vital signs in last 24 hours:  Temp:  [97.4 F (36.3 C)-98.3 F (36.8 C)] 97.4 F (36.3 C) (02/23 1324) Pulse Rate:  [62-97] 97 (02/23 1324) Resp:  [16-29] 16 (02/23 1324) BP: (93-116)/(23-78) 99/61 (02/23 1324) SpO2:  [88 %-99 %] 99 % (02/23 1324)  Weight change:  Filed Weights   12/01/20 0610 12/02/20 0537 12/03/20 0510  Weight: 75.8 kg 73.9 kg 72.1 kg    Intake/Output: No intake/output data recorded.   Intake/Output this shift:  Total I/O In: 480 [P.O.:480] Out: -   General:  lying in bed in NAD  Heart:S1S2 no rub; 1+ pedal edema  Lungs: clear and reduced; unlabored at rest on room air Abdomen:soft, Non-tender, non-distended Extremities: no edema  Neuro - alert and oriented x 3 provides hx and follows commands Psych normal mood and affect Dialysis Access: Right IJ TDC in place    Basic Metabolic Panel: Recent Labs  Lab 12/02/20 1854 12/05/20 0535  NA 136 134*  K 3.2* 3.0*  CL 97* 97*  CO2 21* 22  GLUCOSE 107* 85  BUN 57* 51*  CREATININE 7.99* 7.17*  CALCIUM 8.7* 8.2*  PHOS 2.4* 2.5    Liver Function Tests: Recent Labs   Lab 12/02/20 1854 12/05/20 0535  ALBUMIN 2.5* 2.2*   CBC: Recent Labs  Lab 12/02/20 1853 12/05/20 1310  WBC 11.0* 10.0  HGB 7.5* 7.9*  HCT 24.9* 26.2*  MCV 103.3* 101.2*  PLT 179 168    Imaging: No results found.   Medications:   . sodium chloride     . (feeding supplement) PROSource Plus  30 mL Oral BID BM  . sodium chloride   Intravenous Once  . sodium chloride   Intravenous Once  . amiodarone  200 mg Oral BID   Followed by  . [START ON 12/08/2020] amiodarone  200 mg Oral Daily  . Chlorhexidine Gluconate Cloth  6 each Topical Q0600  . Chlorhexidine Gluconate Cloth  6 each Topical Q0600  . darbepoetin (ARANESP) injection - DIALYSIS  200 mcg Intravenous Q Tue-HD  . docusate  100 mg Oral BID  . feeding supplement (NEPRO CARB STEADY)  237 mL Oral TID WC  . Gerhardt's butt cream   Topical TID  . lactobacillus acidophilus & bulgar  1 tablet Oral TID WC  . lidocaine  1 patch Transdermal Q24H  . mouth rinse  15 mL Mouth Rinse BID  . megestrol  40 mg Oral Daily  . midodrine  10 mg Oral TID WC  . multivitamin  1 tablet Oral QHS  . sucralfate  1  g Oral TID WC & HS  . triamcinolone 0.1 % cream : eucerin   Topical BID   acetaminophen, bisacodyl, calcium carbonate, chlorpheniramine-HYDROcodone, diphenhydrAMINE, loperamide, polyethylene glycol, prochlorperazine **OR** prochlorperazine **OR** prochlorperazine, Resource ThickenUp Clear, traZODone  Assessment/ Plan:  1. Acute kidney Injury now considered ESRD: Baseline creatinine 1.2 with severe ATN related to Covid and acute pancreatitis.  No signs of recovery at this time.  Planning for permanent access as an outpatient once his strength is improved per charting.  - Continue HD on TTS schedule - next HD here tomorrow - has a spot at TTS at [facility] once discharged - starting there Sat.    2.Acute pancreatitis:This was noted on CT scan earlier (1/3 and 1/7). 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  Now status post treatment  3. Acute hypoxic respiratory failure:Secondary to Covid pneumonia and possible aspiration pneumonitis.  Continue supplemental oxygen as needed  4. Hypotension - with dizziness. No/minimal UF with HD on 2/22 planned.  On midodrine.  PRBC's as below   5. Anemia: Secondary to acute/critical illness and GI losses.on aranesp at 200 mcg every tuesday.  IV iron prohibitive because of very high ferritin level.  PRBC's on 2/22 with HD.  Order for PRBC on 2/23 as well to help optimize   6 Secondary hyperparathyroidism:stopped sevelamer - hypophos. PTH level 217 at goal.   Claudia Desanctis, MD 12/06/2020 5:16 PM

## 2020-12-06 NOTE — Progress Notes (Addendum)
[facility] PHYSICAL MEDICINE & REHABILITATION PROGRESS NOTE   Subjective/Complaints: Patient seen sitting up in a chair this morning.  He states he slept well overnight.  He states he feels much better today.  He has questions regarding positioning and his ulcer.  He was seen by nephrology yesterday, notes reviewed-transfused.  He is seen by palliative care as well, notes reviewed-education and emotional support ongoing.  ROS: Denies CP, SOB, N/V/D.  Objective:   No results found. Recent Labs    12/05/20 1310  WBC 10.0  HGB 7.9*  HCT 26.2*  PLT 168   Recent Labs    12/05/20 0535  NA 134*  K 3.0*  CL 97*  CO2 22  GLUCOSE 85  BUN 51*  CREATININE 7.17*  CALCIUM 8.2*    Intake/Output Summary (Last 24 hours) at 12/06/2020 1021 Last data filed at 12/05/2020 1900 Gross per 24 hour  Intake --  Output 0 ml  Net 0 ml     Pressure Injury 11/10/20 Buttocks Right;Mid Unstageable - Full thickness tissue loss in which the base of the injury is covered by slough (yellow, tan, gray, green or brown) and/or eschar (tan, brown or black) in the wound bed. red open area of partial s (Active)  11/10/20 1721  Location: Buttocks  Location Orientation: Right;Mid  Staging: Unstageable - Full thickness tissue loss in which the base of the injury is covered by slough (yellow, tan, gray, green or brown) and/or eschar (tan, brown or black) in the wound bed.  Wound Description (Comments): red open area of partial skin loss with eschar covering wound bed  Present on Admission: Yes    Physical Exam: Vital Signs Blood pressure 102/66, pulse 75, temperature 98.2 F (36.8 C), temperature source Oral, resp. rate 17, height 5\' 10"  (1.778 m), weight 72.1 kg, SpO2 (!) 89 %.  Constitutional: No distress . Vital signs reviewed. HENT: Normocephalic.  Atraumatic. Eyes: EOMI. No discharge. Cardiovascular: No JVD.  RRR. Respiratory: Normal effort.  No stridor.  Bilateral clear to auscultation.   GI:  Non-distended.  BS +. Skin: Warm and dry.   Coccyx ulcer Psych: Normal mood.  Normal behavior. Musc: Bilateral lower extremity edema, improved No tenderness in extremities. Neuro: Alert Motor: Bilateral upper extremities: 4+/5 throughout, stable Left lower extremity: Hip flexion, knee extension 4-/5, ankle dorsiflexion 4+/5, unchanged Right lower extremity: Hip flexion, knee extension 3 +-4-/5, ankle dorsiflexion 4+/5, unchanged  Assessment/Plan: 1. Functional deficits which require 3+ hours per day of interdisciplinary therapy in a comprehensive inpatient rehab setting.  Physiatrist is providing close team supervision and 24 hour management of active medical problems listed below.  Physiatrist and rehab team continue to assess barriers to discharge/monitor patient progress toward functional and medical goals  Care Tool:  Bathing    Body parts bathed by patient: Right arm,Left arm,Chest,Abdomen,Face,Front perineal area   Body parts bathed by helper: Buttocks     Bathing assist Assist Level: Minimal Assistance - Patient > 75%     Upper Body Dressing/Undressing Upper body dressing   What is the patient wearing?: Pull over shirt,Button up shirt    Upper body assist Assist Level: Supervision/Verbal cueing    Lower Body Dressing/Undressing Lower body dressing      What is the patient wearing?: Pants     Lower body assist Assist for lower body dressing: Moderate Assistance - Patient 50 - 74%     Toileting Toileting    Toileting assist Assist for toileting: Moderate Assistance - Patient 50 - 74%  Transfers Chair/bed transfer  Transfers assist     Chair/bed transfer assist level: Contact Guard/Touching assist Chair/bed transfer assistive device: Therapist, sports   Ambulation assist   Ambulation activity did not occur: Safety/medical concerns  Assist level: Contact Guard/Touching assist Assistive device: Walker-rolling Max  distance: 21ft   Walk 10 feet activity   Assist  Walk 10 feet activity did not occur: Safety/medical concerns  Assist level: Contact Guard/Touching assist Assistive device: Walker-rolling   Walk 50 feet activity   Assist Walk 50 feet with 2 turns activity did not occur: Safety/medical concerns         Walk 150 feet activity   Assist Walk 150 feet activity did not occur: Safety/medical concerns         Walk 10 feet on uneven surface  activity   Assist Walk 10 feet on uneven surfaces activity did not occur: Safety/medical concerns         Wheelchair     Assist Will patient use wheelchair at discharge?: Yes Type of Wheelchair: Manual    Wheelchair assist level: Supervision/Verbal cueing Max wheelchair distance: 149ft    Wheelchair 50 feet with 2 turns activity    Assist        Assist Level: Supervision/Verbal cueing   Wheelchair 150 feet activity     Assist      Assist Level: Supervision/Verbal cueing     Medical Problem List and Plan: 1.  Generalized weakness affecting ability to stand or complete ADL tasks secondary to debility.  Continue CIR  Previously discussed with wife plans for discharge and goals of care  Team conference today to discuss current and goals and coordination of care, home and environmental barriers, and discharge planning with nursing, case manager, and therapies. Please see conference note from today as well.  2.  Antithrombotics: -DVT/anticoagulation:  Mechanical: Sequential compression devices, below knee Bilateral lower extremities due to GI bleed.             -antiplatelet therapy: N/A 3. Pain Management: Tylenol prn.   Controlled with meds on 2/23 4. Mood: LCSW to follow for evaluation and support.              -antipsychotic agents: N/a 5. Neuropsych: This patient is capable of making decisions on his own behalf. 6. Skin/Wound Care: Routine pressure relief measures.   Wound looks good Wet to dry with  Dakins solution covered by foamn dressing   -patient says he is not being repositioned every 2H, placed nursing order to request this. 7. Fluids/Electrolytes/Nutrition: Strict I/Os.  Labs with HD.discussed 1245ml fluid restriction . Megace started to stimulate appetite.  8.  ESRD: On midodrine prior to HD to help support BP.   Permanent access as outpatient  Appreciate nephro recs 9. Chronic systolic CHF/CAF: Strict I/Os. Fluid status managed with HD.              Daily weights  Lasix per Nephro, appreciate Recs  Amio per Cards recs  Filed Weights   12/01/20 0610 12/02/20 0537 12/03/20 0510  Weight: 75.8 kg 73.9 kg 72.1 kg   Trending down on 2/20, no repeat weights since that time 10.  Pancreatitis: Has completed antibiotic course. Tolerating diet without nausea, diarrhea or abdominal pain.              Monitor closely.  Tolerating diet at present, variable p.o. intake 11. LGIB: Continue Carafate ac/hs.  12. Acute on chronic anemia: On aranresp weekly.  Labs with HD, transfused on 2/5  Hemoglobin 7.9 on 2/22 13. Thrombocytopenia: Monitor for signs of bleeding.              Labs with HD  Resolved 14. Acute respiratory failure: Has resolved.   See #16 15.  Leukocytosis: Resolved  WBCs 10.0 on 2/22  Afebrile 16.  Steroid-induced hyperglycemia: Resolved  SSI  Prednisone decreased to 20 on 2/4, decreased to 10 on 2/9, decreased to 5 on 2/15, DC'd on 2/19  Relatively controlled on 2/18, DC'd CBG checks   17.  Dizziness  Likely due to anemia and soft blood pressures and limited p.o. intake in accordance with HD, patient not drinking nearly up to 1200cc fluid restriction  Trying to drink more, discussed again  Continue midodrine  Improving after transfusion 18. Slow transit constipation:  Improving 19. Dysphagia   D3 thins, advance diet as tolerated  LOS: 26 days A FACE TO FACE EVALUATION WAS PERFORMED  Diamond Martucci Lorie Phenix 12/06/2020, 10:21 AM

## 2020-12-06 NOTE — Progress Notes (Signed)
Occupational Therapy Session Note  Patient Details  Name: Raymond Hurley. MRN: [id number] Date of Birth: [birth year]  Today's Date: 12/06/2020 OT Individual Time: 6294-7654 OT Individual Time Calculation (min): 55 min    Short Term Goals: Week 4:  OT Short Term Goal 1 (Week 4): STG = LTGs due to remaining LOS  Skilled Therapeutic Interventions/Progress Updates:    Treatment session with focus on functional mobility and transfers.  Pt received asleep in bed, reports feeling "better" today and agreeable to attempting bed mobility to progress to OOB.  Pt completed bed mobility with min assist to come to sitting EOB with increased effort.  Pt completed sit > stand from EOB with RW with min assist.  Completed stand pivot transfer bed > w/c with RW with CGA.  Pt engaged in grooming tasks seated at sink with setup.  Pt's breakfast tray arrived and pt expressed desire to eat as he "needs nourishment to get stronger".  Pt remained upright in w/c with leg rests on and all needs in reach to eat breakfast.  Pt reports feeling "better" today.  However BP still low with mobility with pt still with c/o dizziness requiring 2-3 mins to recover.  Pt also with persistent cough with mobility.  O2 sats dropped to 85 on room air sitting upright, however improved with cues for breathing technique.  BP assessed in supine 92/49 and HR 94, sitting at EOB 72/56 and HR 115, and after stand pivot transfer BP 94/56 and HR 106.  Pt requires increased time and effort for all mobility and rest breaks post any activity due to dizziness.  Therapy Documentation Precautions:  Precautions Precautions: Fall,Other (comment) Precaution Comments: Bowel incontinence (apparently had flexiseal removed yesterday) Restrictions Weight Bearing Restrictions: No Pain:  Pt with no c/o pain   Therapy/Group: Individual Therapy  [name] 12/06/2020, 9:31 AM

## 2020-12-06 NOTE — Progress Notes (Signed)
Physical Therapy Session Note  Patient Details  Name:  Raymond Hurley. MRN: [id number] Date of Birth: 10/13/1931  Today's Date: 12/06/2020 PT Individual Time: 1101-1155 and 1434-1521   PT Individual Time Calculation (min): 54 min and 47 min PT Missed Time: 13 minutes PT Missed Time Reason: Fatigue   Short Term Goals: Week 2:  PT Short Term Goal 1 (Week 2): Pt will perform stand<>pivot transfer with LRAD and CGA PT Short Term Goal 1 - Progress (Week 2): Met PT Short Term Goal 2 (Week 2): Pt will ambulate 30ft with LRAD and min A PT Short Term Goal 2 - Progress (Week 2): Met PT Short Term Goal 3 (Week 2): Pt will perform bed mobility with CGA consistantly PT Short Term Goal 3 - Progress (Week 2): Progressing toward goal Week 3:  PT Short Term Goal 1 (Week 3): STG=LTG due to LOS  Skilled Therapeutic Interventions/Progress Updates:   Treatment Session 1: 1101-1155 54 min Received pt sitting in WC, pt agreeable to therapy, and denied any pain during session but reported feeling "weak" and with difficulty focusing this morning. Session with emphasis on functional mobility/transfers, generalized strengthening, dynamic standing balance/coordination, ambulation, and improved activity tolerance. BP sitting in WC 87/55 with ted hose on but no abdominal binder due to reports of difficulty breathing and expanding lungs with it on. Pt transported to dayroom in WC total A for energy conservation purposes and transferred sit<>stand with RW and min A and ambulated 6ft x1 and 9ft x 1 with RW and CGA prior to needing to sit. Pt reported feeling more weak today than normal requiring extensive rest breaks throughout session. Pt reported not having enough energy to try walking again and requested to work on seated exercises due to fatigue and weakness. Worked on seated ball kicks with emphasis on endurance, coordination, and LE strengthening for 2x45 seconds. Pt performed seated toe taps to 3 cones x 3 trials  bilaterally with emphasis on coordination and hip flexor strengthening. Pt with increased difficulty achieving full hip flexion ROM on RLE due weakness and "heaviness". Pt performed the following exercises sitting in WC with supervision and verbal cues for technique: -hip flexion 2x8 bilaterally -LAQ 2x8 bilaterally -horizontal chest press with unweighted dowel 2x10 -overhead chest press with unweighted dowel 2x10 -rows with grn TB 2x10 -hamstring curls with grn TB 2x10 bilaterally  -hip abduction with grn TB 2x10 Pt transported back to room in WC total A. Concluded session with pt sitting in WC, needs within reach, and seatbelt alarm on.    Treatment Session 2: 1434-1521 47 min Received pt sitting in WC with wife present for family education finishing up with dietitian, pt agreeable to therapy, and denied any pain during session but reported feeling the same as this morning. Session with emphasis on discharge planning, functional mobility/transfers, generalized strengthening, dynamic standing balance/coordination, ambulation, and improved activity tolerance. Pt transported to dayroom in WC total A for energy conservation purposes. Pt transferred sit<>stand with RW and min A and ambulated 10ft with RW and CGA prior to requesting to sit. Educated pt's wife on body mechanics and hand placement and pt performed additional sit<>stand transfer with Anne with min A and pt able to stand ~15 seconds prior to sitting. Educated Anne on WC parts management including donning/doffing legrests and armrests and locking/unlocking braces; Anne verbalized and demonstrated understanding. Pt reported being too fatigued to practice car transfer but went to ortho gym and talked through technique with Anne verbalizing and demonstrating confidence with   transfer. Pt transported back to room in WC total A. Concluded session with pt sitting in WC, needs within reach, and seatbelt alarm on with wife present at bedside. 13 minutes  missed of skilled physical therapy due to fatigue.   Therapy Documentation Precautions:  Precautions Precautions: Fall,Other (comment) Precaution Comments: Bowel incontinence (apparently had flexiseal removed yesterday) Restrictions Weight Bearing Restrictions: No  Therapy/Group: Individual Therapy  M     PT, DPT   12/06/2020, 7:29 AM  

## 2020-12-06 NOTE — Progress Notes (Signed)
Received confirmation from CIR CSW that plan remains for discharge tomorrow. Navigator spoke with HD unit to request that patient is on first shift tomorrow to accommodate discharge. His transportation will not take him home past 5pm. Nephrologist updated. Patient's wife states she has spoken with clinic manager at [facility] clinic and has arranged to sign paperwork Friday morning for patient to start in outpatient HD clinic Saturday.  Patient's wife is extremely appreciative.   Alphonzo Cruise, [place] [facility] Navigator  [phone]

## 2020-12-06 NOTE — Progress Notes (Signed)
Physical Therapy Discharge Summary  Patient Details  Name: Raymond Hurley. MRN: [id number] Date of Birth: 04-Sep-1931  Patient has met 2 of 8 long term goals due to improved activity tolerance, improved balance, improved postural control, increased strength, improved awareness and improved coordination. Patient to discharge at a wheelchair level CGA/min A.  Patient's care partner requires assistance to provide the necessary physical assistance at discharge. Pt's wife plans to hire assist upon D/C. Pt's wife attended family education training on 2/23 and verbalized and demonstrated confidence with transfers and WC parts management. Discussed technique for car transfers (did not practice due to pt fatigue) and pt's wife observed pt walking with therapy. Pt/wife with no further questions regarding mobility.   Reasons goals not met: Pt did not meet transfer goal of supervision as pt currently requires min A to stand and CGA for stand<>pivot transfers. Pt did not meet bed mobility goal of supervision as pt requires min/mod A for LE management and trunk control. Pt also did not meet ambulation goal of 21f with CGA as pt is only able to ambulate 370fwith CGA prior to needing to sit. Pt did not meet these goals due to fatigue, SOB, occasional dizziness, generalized weakness, and poor endurance to activity.   Recommendation:  Patient will benefit from ongoing skilled PT services in home health setting to continue to advance safe functional mobility, address ongoing impairments in transfers, generalized strengthening, dynamic standing balance/coordination, ambulation, endurance, and to minimize fall risk.  Equipment: 18x18 manual WC with Roho cushion  Reasons for discharge: lack of progress toward goals and discharge from hospital  Patient/family agrees with progress made and goals achieved: Yes  PT Discharge Precautions/Restrictions Precautions Precautions: Fall;Other (comment) Precaution  Comments: low BP Restrictions Weight Bearing Restrictions: No Cognition Overall Cognitive Status: Within Functional Limits for tasks assessed Arousal/Alertness: Awake/alert Orientation Level: Oriented X4 Memory: Appears intact Awareness: Appears intact Problem Solving: Appears intact Safety/Judgment: Appears intact Sensation Sensation Light Touch: Appears Intact Proprioception: Appears Intact Coordination Gross Motor Movements are Fluid and Coordinated: No Fine Motor Movements are Fluid and Coordinated: Yes Coordination and Movement Description: mild uncoordination due to generalized weakness, decreased endurance, and occasional dizziness Finger Nose Finger Test: WFSurgery Center Of Bone And Joint Instituteilaterally Heel Shin Test: decreased ROM on RLE Motor  Motor Motor: Abnormal postural alignment and control Motor - Skilled Clinical Observations: generalized weakness, poor endurance, and occasional dizziness  Mobility Bed Mobility Bed Mobility: Rolling Right;Rolling Left;Supine to Sit;Sit to Supine Rolling Right: Supervision/verbal cueing Rolling Left: Supervision/Verbal cueing Supine to Sit: Minimal Assistance - Patient > 75% Sit to Supine: Moderate Assistance - Patient 50-74% Transfers Transfers: Sit to Stand;Stand to Sit;Stand Pivot Transfers Sit to Stand: Minimal Assistance - Patient > 75% Stand to Sit: Contact Guard/Touching assist Stand Pivot Transfers: Contact Guard/Touching assist Transfer (Assistive device): Rolling walker Locomotion  Gait Ambulation: Yes Gait Assistance: Contact Guard/Touching assist Gait Distance (Feet): 30 Feet Assistive device: Rolling walker Gait Assistance Details: Verbal cues for technique Gait Assistance Details: verbal cues for pursed lip breathing Gait Gait: Yes Gait Pattern: Impaired Gait Pattern: Step-through pattern;Decreased stride length;Decreased trunk rotation;Decreased step length - right;Decreased step length - left;Trunk flexed;Poor foot clearance -  left;Poor foot clearance - right;Narrow base of support Gait velocity: decreased Wheelchair Mobility Wheelchair Mobility: Yes Wheelchair Assistance: SuChartered loss adjusterBoth upper extremities Wheelchair Parts Management: Needs assistance Distance: [id number]/Postural Assessment  Cervical Assessment Cervical Assessment: Exceptions to WFLUcsf Benio[facility] Children[facility] Hospital [facility] Ctr At Oaklandervical flexion) Thoracic Assessment Thoracic Assessment: Exceptions to WFLWesthealth Surgery Centeryphosis) Lumbar Assessment Lumbar Assessment:  Exceptions to [facility] (posterior pelvic tilt) Postural Control Postural Control: Deficits on evaluation  Balance Balance Balance Assessed: Yes Static Sitting Balance Static Sitting - Balance Support: Feet supported;Bilateral upper extremity supported Static Sitting - Level of Assistance: 6: Modified independent (Device/Increase time) Dynamic Sitting Balance Dynamic Sitting - Balance Support: Feet supported;Bilateral upper extremity supported Dynamic Sitting - Level of Assistance: 6: Modified independent (Device/Increase time) Static Standing Balance Static Standing - Balance Support: Bilateral upper extremity supported (RW) Static Standing - Level of Assistance: 5: Stand by assistance (close supervision) Dynamic Standing Balance Dynamic Standing - Balance Support: Bilateral upper extremity supported (RW) Dynamic Standing - Level of Assistance: 5: Stand by assistance (CGA) Dynamic Standing - Comments: with transfers and gait Extremity Assessment  RLE Assessment RLE Assessment: Exceptions to [facility] General Strength Comments: grossly generalized to 4-/5 (DF and hip adduction 3+/5). Edema in RLE>LLE LLE Assessment LLE Assessment: Exceptions to [facility] General Strength Comments: grossly generalized to 4-/5 (except hip adduction 3+/5)   Alfonse Alpers PT, DPT  12/06/2020, 12:26 PM

## 2020-12-06 NOTE — Progress Notes (Signed)
Inpatient Rehabilitation Care Coordinator Discharge Note  The overall goal for the admission was met for:   Discharge location: [place] HIRED CNA 24/7  Length of Stay: Yes-27 DAYS  Discharge activity level: Yes-CGA-MIN LEVEL OF ASSIST  Home/community participation: Yes  Services provided included: MD, RD, PT, OT, SLP, RN, CM, Pharmacy, Neuropsych and SW  Financial Services: Medicare and Private Insurance: [place] offered to/list presented to:YES  Follow-up services arranged: Home Health: [facility], DME: [facility] SU[facility], WHEELCHAIR, TUB BENCH and Patient/Family request agency HH: [facility], DME: NO PREF  Comments (or additional information):WIFE WAS HERE FOR EDUCATION DIFFICULT TO COMPLETE DUE TO PT'S LOW BP, WHICH INTERFERED IN THERAPY PARTICIPATION WHILE ON REHAB. HE IS AT HIGH RISK TO RE-ADMIT DUE TO BP AND HIS MULTIPLE MEDICAL ISSUES. DISCUSSED OVER AND OVER WOUND DRESSING AND WIFE OR SOMEONE BEING TAUGHT WOUND CARE. HHRN WILL NOT BE THERE DAILY TO DO THIS DRESSING. WIFE CONTINUED TO EXPRESS PT NOT BEING READY TO GO HOME AND PT EXPRESSED THIS ALSO. MD SPOKE WITH AT LENGTH REGARDING THIS. WIFE REPORTS THE TUB BENCH DOESN'Aziz FIT IN THEIR TUB WILL NEED TO TAKE BACK TO RETAIL STORE. ROHO CUSHION WAS NOT DELIVERED TO THE HOME AND WILL NEED TO SWITCH OUT BASIC CUSHION WITH ROHO AT [place]. WIFE REPORTS WILL SEND ONE OF THEIR CHILDREN TO DO THIS TODAY. WIFE INSISTENT ON TAKING PT HOME AND NOT DELAYING TRANSPORT ALTHOUGH HIS BP WAS LOW. SHE FELT THIS COULD BE MANAGED AT HOME AND HE WOULD BE BETTER AT HOME.   Patient/Family verbalized understanding of follow-up arrangements: Yes  Individual responsible for coordination of the follow-up plan: ANNE-WIFE [phone]  Confirmed correct DME delivered: Elease Hashimoto 12/06/2020    Staci Carver, [place]

## 2020-12-06 NOTE — Progress Notes (Signed)
Patient ID: Raymond Hurley., male   DOB: 12-18-1930, 87 y.o.   MRN: [id number] Met wit wife when here to answer questions, aware will be on first run for HD tomorrow. Setting up medical transport for discharge home. Wife here for PT education. Have informed Raymond Hurley of discharge tomorrow.

## 2020-12-06 NOTE — Progress Notes (Signed)
Occupational Therapy Discharge Summary  Patient Details  Name: Raymond Hurley. MRN: [id number] Date of Birth: 05/13/1931   Patient has met 3 of 8 long term goals due to improved balance, ability to compensate for deficits and improved awareness.  Patient to discharge at [facility] Assist level.  Patient's care partner is independent to provide the necessary physical assistance at discharge.  Pt's wife reports unable to provide physical assistance but plan is to have hired care, home health RN, and home health OT/PT.  Pt had minimal participation in scheduled family ed sessions due to low BP, however wife did assist with sit > stand during PT family ed session.  Per MD, BP is going to be an issue due to multiple medical comorbidities.    Reasons goals not met: Pt currently requires min assist with bathing and LB dressing and min-mod assist for toileting due to decreased dynamic standing balance and endurance as needed to wash buttocks or complete toileting hygiene.  Pt continues to require min assist - CGA for stand pivot transfers to toilet/3 in 1 due to fluctuating endurance and dizziness with activity.  Recommendation:  Patient will benefit from ongoing skilled OT services in home health setting to continue to advance functional skills in the area of BADL and Reduce care partner burden.  Equipment: tub transfer bench, already has 3 in 1  Reasons for discharge: discharge from hospital  Patient/family agrees with progress made and goals achieved: Yes  OT Discharge Precautions/Restrictions  Precautions Precautions: Fall;Other (comment) Precaution Comments: low BP Restrictions Weight Bearing Restrictions: No Vital Signs Therapy Vitals Temp: (!) 97.4 F (36.3 C) Temp Source: Oral Pulse Rate: 97 Resp: 16 BP: 99/61 Patient Position (if appropriate): Sitting Oxygen Therapy SpO2: 99 % O2 Device: Room Air Pain Pain Assessment Pain Scale: 0-10 Pain Score: 0-No  pain ADL ADL Eating: Set up Where Assessed-Eating: Wheelchair Grooming: Setup Where Assessed-Grooming: Wheelchair,Sitting at sink Upper Body Bathing: Supervision/safety,Setup Where Assessed-Upper Body Bathing: Wheelchair,Sitting at sink Lower Body Bathing: Minimal assistance Where Assessed-Lower Body Bathing: Standing at sink,Sitting at sink Upper Body Dressing: Supervision/safety Where Assessed-Upper Body Dressing: Wheelchair Lower Body Dressing: Minimal assistance Where Assessed-Lower Body Dressing: Standing at sink,Sitting at sink Toileting: Moderate assistance Where Assessed-Toileting: Toilet,Bedside Commode Toilet Transfer: Minimal assistance Toilet Transfer Method: Stand Ecologist: Bedside commode Vision Baseline Vision/History: Wears glasses Wears Glasses: At all times Patient Visual Report: No change from baseline Vision Assessment?: No apparent visual deficits Perception  Perception: Within Functional Limits Praxis Praxis: Intact Cognition Overall Cognitive Status: Within Functional Limits for tasks assessed Arousal/Alertness: Awake/alert Orientation Level: Oriented X4 Memory: Appears intact Awareness: Appears intact Problem Solving: Appears intact Safety/Judgment: Appears intact Sensation Sensation Light Touch: Appears Intact Proprioception: Appears Intact Coordination Gross Motor Movements are Fluid and Coordinated: No Fine Motor Movements are Fluid and Coordinated: Yes Coordination and Movement Description: mild uncoordination due to generalized weakness, decreased endurance, and occasional dizziness Finger Nose Finger Test: [facility] bilaterally Heel Shin Test: decreased ROM on RLE Motor  Motor Motor: Abnormal postural alignment and control Motor - Skilled Clinical Observations: generalized weakness, poor endurance, and occasional dizziness  Trunk/Postural Assessment  Cervical Assessment Cervical Assessment: Exceptions to [facility]  (cervical flexion) Thoracic Assessment Thoracic Assessment: Exceptions to [facility] (kyphosis) Lumbar Assessment Lumbar Assessment: Exceptions to [facility] (posterior pelvic tilt) Postural Control Postural Control: Deficits on evaluation  Balance Balance Balance Assessed: Yes Static Sitting Balance Static Sitting - Balance Support: Feet supported;Bilateral upper extremity supported Static Sitting - Level of Assistance: 6:  Modified independent (Device/Increase time) Dynamic Sitting Balance Dynamic Sitting - Balance Support: Feet supported;Bilateral upper extremity supported Dynamic Sitting - Level of Assistance: 6: Modified independent (Device/Increase time) Static Standing Balance Static Standing - Balance Support: Bilateral upper extremity supported (RW) Static Standing - Level of Assistance: 5: Stand by assistance (close supervision) Dynamic Standing Balance Dynamic Standing - Balance Support: Bilateral upper extremity supported (RW) Dynamic Standing - Level of Assistance: 5: Stand by assistance (CGA) Dynamic Standing - Comments: with transfers and gait Extremity/Trunk Assessment RUE Assessment RUE Assessment: Within Functional Limits General Strength Comments: generalized weakness LUE Assessment LUE Assessment: Within Functional Limits General Strength Comments: generalized weakness   Raymond Hurley, [facility] 12/06/2020, 3:54 PM

## 2020-12-06 NOTE — Progress Notes (Signed)
Patient ID: Raymond Hurley., male   DOB: 03-Mar-1931, 87 y.o.   MRN: [id number]  This NP reviewed medical records,  discussed case with team.     Today is day 25 in CIR and working well with therapies. He is  HD dependent, frail and with multiple co-morbidites, high risk to decompensate 2/2 to multiple co-morbidites.  Plan is for discharge tomorrow  I met at patient's bedside, he is at the sink in [facility].   He is alert and oriented.    Wife at bedside.   She is nervous about discharge in the morning.  Therapeutic listening and emotional support offered.  Wife tells me she has secured 24/7 nursing assistants help. Education offered regarding outpatient community-based palliative services.  They are open to this and I alerted social work to the referral .  Education offered on the importance of conversation and documentation of advanced care planning.  Wife tells me "we have all that" unfortunately we have not ever received a copy of this to scanned into the chart.    MOST form introduced again and a blank copy is left with family.  No interest in completing   Education offered to  patient and his wife regarding the importance of continued conversation with each other  and their  medical providers regarding overall plan of care and treatment options,  ensuring decisions are within the context of the patients values and GOCs.  Total time spent on the unit was 25 minutes  Greater than 50% of the time was spent in counseling and coordination of care  Wadie Lessen NP  Palliative Medicine Team Team Phone # [phone] Pager [phone]

## 2020-12-06 NOTE — Patient Care Conference (Signed)
Inpatient RehabilitationTeam Conference and Plan of Care Update Date: 12/06/2020   Time: 11:47 AM    Patient Name: Raymond Hurley.      Medical Record Number: [id number]  Date of Birth: 05-18-1931 Sex: Male         Room/Bed: 4W25C/4W25C-01 Payor Info: Payor: MEDICARE / Plan: MEDICARE PART A AND B / Product Type: *No Product type* /    Admit Date/Time:  11/10/2020  3:28 PM  Primary Diagnosis:  [place] [facility] Problems: Principal Problem:   Debility Active Problems:   Steroid-induced hyperglycemia   Leucocytosis   Acute on chronic anemia   ESRD on dialysis [facility])   Palliative care by specialist   Acute pancreatitis   Dizziness and giddiness   Slow transit constipation   Hemodialysis-associated hypotension   Protein-calorie malnutrition, severe   Dysphagia    Expected Discharge Date: Expected Discharge Date: 12/07/20  Team Members Present: Physician leading conference: Dr. Delice Lesch Care Coodinator Present: Dorien Chihuahua, RN, BSN, CRRN;Becky Dupree, LCSW Nurse Present: Other (comment) Sande Brothers, [place], RN) PT Present: Magda Kiel, PT OT Present: Elisabeth Most, OT PPS Coordinator present : Gunnar Fusi, Novella Olive, PT     Current Status/Progress Goal Weekly Team Focus  Bowel/Bladder   Pt. is continence B/B LBM 12/05/20  Pt. will maintain normal B/B pattern  Toilet patient PRN on every shift   Swallow/Nutrition/ Hydration             ADL's   min assist sit > stand with RW, Min assist - CGA stand pivot transfers with RW, mod assist toileting, min assist LB dressing with AE (needs assist with TEDS), Supervision/setup UB bathing, dressing, and grooming tasks  Supervision overall, downgraded toilet transfer to [place] and toileting to min assist  ADL retraining, sit > stand, stand pivot transfers, dynamic standing balance, AE education, activity tolerance/endurance   Mobility   bed mobility supervision using bed features, sit<>stand min A with RW, stand<>pivot  transfers CGA, gait 57ft with RW CGA, WC mobility 132ft supervision  supervision assist transfers and bed mobility, CGA gait, supervision WC mobility  functional mobility/transfers, generalized strengthening, dynamic standing balance/coordination, ambulation, and endurance   Communication             Safety/Cognition/ Behavioral Observations            Pain   Pt. pain is currenlty manage with PRN medication  Assess pt. for pain on every shift and PRN.  Notify MD if pain is not relief by current PRN medication   Skin   Pt has unstageable pressure ulcer on his cocyx being treated with wet to dry darkins solution.  Pt. will maintain intact skin integrity with no further skin breakdown.  Assess pt. skin on every shift and PRN for skin breakdown     Discharge Planning:  Wife feels with BP issues has not been able to participate in therapies, will try family education this afternoon at 3:00. Aware medical decision regarding discharge tomorrow versus another day   Team Discussion: Medically ready for discharge. Orthostasis continues and monitored per MD. Continue HD. Continue treatment for pressure injury on coccyx.  Patient on target to meet rehab goals: Function has waxed and waned with therapy; currently min assist for transfers and mod assist for toileting  *See Care Plan and progress notes for long and short-term goals.   Revisions to Treatment Plan:   Teaching Needs: Transfers,toileting, medications, skin care, etc.   Current Barriers to Discharge: Decreased caregiver support, Home enviroment  access/layout, Wound care and Hemodialysis  Possible Resolutions to Barriers: Family education SNF recommended Hired caregivers to assist wife with patient care     Medical Summary Current Status: Generalized weakness affecting ability to stand or complete ADL tasks secondary to debility.  Barriers to Discharge: Medical stability;Wound care;Hemodialysis;Decreased family/caregiver  support;Weight   Possible Resolutions to [employer]: Therapies, follow labs - Hb, Plts, WBCs, follow weights, transfuse as necessary, pt and family edu   Continued Need for Acute Rehabilitation Level of Care: The patient requires daily medical management by a physician with specialized training in physical medicine and rehabilitation for the following reasons: Direction of a multidisciplinary physical rehabilitation program to maximize functional independence : Yes Medical management of patient stability for increased activity during participation in an intensive rehabilitation regime.: Yes Analysis of laboratory values and/or radiology reports with any subsequent need for medication adjustment and/or medical intervention. : Yes   I attest that I was present, lead the team conference, and concur with the assessment and plan of the team.   [name] 12/06/2020, 3:54 PM

## 2020-12-06 NOTE — Progress Notes (Signed)
Patient ID: Raymond Hurley., male   DOB: 09-05-1931, 87 y.o.   MRN: [id number] [place] with wife to inform her discharge still planned for tomorrow but wants to see how education goes this afternoon. Aware if BP low will be I the bed. Wife still doesn't feel he is ready but will get her plans on board for tomorrow and will call ambulance to take back to hospital if BP low once home. Discussed wound care again and the fact she or someone will need to be educated on this, since [facility] will not go out daily. Still disagrees with SNF placement. She reports they are hiring an aide 24/7 for home to assist with his care. See how afternoon education goes.

## 2020-12-06 NOTE — Progress Notes (Signed)
Brief Note:  Please see progress note from earlier today as well.  Went into patient's room to answer any potential questions from patient's wife.  Patient's wife very abrupt, terse, and condescending.  She is upset with scheduled discharge tomorrow.  Discharge was changed to a week earlier, but based on wife's insistence and patient progress discharge date changed to wife desired discharge date of tomorrow.  However wife states that patient needs to be here longer.  She states she expected patient to be able to walk out of the hospital independently and is upset that he is not at that level of functioning.  She is also upset that patient has a wound and needs inpatient hospitalization in order for it to be cared for.  Finally, wife complains of blood pressure issues stating that she is going to bring the patient back to the hospital when his blood pressure drops at home.  Discussed with the wife at length again regarding patient's past medical history, particularly his significant heart failure as well as renal failure that complicates patient's fluid management and resulting blood pressure issues (fluctuations in accordance with HD).  Also discussed patient's hemoglobin and transfusion that he received yesterday with wife.  Patient notes poor endurance.  Again discussed with patient it will take a significant amount of time to regain endurance.  Informed wife that these things have been chronic and will continue to be issues going forward and that hospitalization is not required for management.  Have continued to encourage patient to drink fluids (up to 1200cc), however, fluid intake is limited. Did however offer again the option of skilled nursing facility, however wife continues to refuse skilled nursing facility.  Wife believes patient is entitled to stay in inpatient rehab for 100 days, have attempted to educate wife on several occasions.  Plan is for patient's wife to have family education later today.   Attempted to reassure and educate wife, however wife continue to be upset and short.  Informed patient and wife I would be available should any further questions arise.  Discussed with case manager as well.

## 2020-12-07 LAB — BPAM RBC
Blood Product Expiration Date: 202203012359
Blood Product Expiration Date: 202203272359
ISSUE DATE / TIME: 202202221646
ISSUE DATE / TIME: 202202231810
Unit Type and Rh: 5100
Unit Type and Rh: 5100

## 2020-12-07 LAB — TYPE AND SCREEN
ABO/RH(D): O POS
Antibody Screen: NEGATIVE
Unit division: 0
Unit division: 0

## 2020-12-07 LAB — GLUCOSE, CAPILLARY
Glucose-Capillary: 82 mg/dL (ref 70–99)
Glucose-Capillary: 85 mg/dL (ref 70–99)

## 2020-12-07 LAB — RENAL FUNCTION PANEL
Albumin: 2.1 g/dL — ABNORMAL LOW (ref 3.5–5.0)
Anion gap: 13 (ref 5–15)
BUN: 49 mg/dL — ABNORMAL HIGH (ref 8–23)
CO2: 23 mmol/L (ref 22–32)
Calcium: 8.2 mg/dL — ABNORMAL LOW (ref 8.9–10.3)
Chloride: 102 mmol/L (ref 98–111)
Creatinine, Ser: 5.98 mg/dL — ABNORMAL HIGH (ref 0.61–1.24)
GFR, Estimated: 9 mL/min — ABNORMAL LOW (ref >60)
Glucose, Bld: 109 mg/dL — ABNORMAL HIGH (ref 70–99)
Phosphorus: 3 mg/dL (ref 2.5–4.6)
Potassium: 3.5 mmol/L (ref 3.5–5.1)
Sodium: 138 mmol/L (ref 135–145)

## 2020-12-07 LAB — CBC
HCT: 31.2 % — ABNORMAL LOW (ref 39.0–52.0)
Hemoglobin: 9.6 g/dL — ABNORMAL LOW (ref 13.0–17.0)
MCH: 30.4 pg (ref 26.0–34.0)
MCHC: 30.8 g/dL (ref 30.0–36.0)
MCV: 98.7 fL (ref 80.0–100.0)
Platelets: UNDETERMINED 10*3/uL (ref 150–400)
RBC: 3.16 MIL/uL — ABNORMAL LOW (ref 4.22–5.81)
RDW: 24.6 % — ABNORMAL HIGH (ref 11.5–15.5)
WBC: 9.5 10*3/uL (ref 4.0–10.5)
nRBC: 0 % (ref 0.0–0.2)

## 2020-12-07 MED ORDER — HEPARIN SODIUM (PORCINE) 1000 UNIT/ML IJ SOLN
1000.0000 [IU] | INTRAMUSCULAR | Status: DC | PRN
  Filled 2020-12-07: qty 1

## 2020-12-07 MED ORDER — MEGESTROL ACETATE 40 MG PO TABS: 40.0000 mg | ORAL_TABLET | Freq: Every day | ORAL | 0 refills | Status: DC

## 2020-12-07 MED ORDER — RENA-VITE PO TABS: 1.0000 | ORAL_TABLET | Freq: Every day | ORAL | 0 refills | Status: DC

## 2020-12-07 MED ORDER — ACETAMINOPHEN 325 MG PO TABS: 325.0000 mg | ORAL_TABLET | ORAL | Status: DC | PRN

## 2020-12-07 MED ORDER — CALCIUM CARBONATE ANTACID 500 MG PO CHEW: 1.0000 | CHEWABLE_TABLET | Freq: Two times a day (BID) | ORAL | Status: DC | PRN

## 2020-12-07 MED ORDER — SUCRALFATE 1 GM/10ML PO SUSP: 1.0000 g | Freq: Three times a day (TID) | ORAL | 0 refills | Status: DC

## 2020-12-07 MED ORDER — AMIODARONE HCL 200 MG PO TABS: ORAL_TABLET | ORAL | 0 refills | Status: AC

## 2020-12-07 MED ORDER — TRIAMCINOLONE ACETONIDE 0.025 % EX OINT: 1.0000 "application " | TOPICAL_OINTMENT | Freq: Two times a day (BID) | CUTANEOUS | 1 refills | Status: DC

## 2020-12-07 MED ORDER — LACTINEX PO CHEW: 1.0000 | CHEWABLE_TABLET | Freq: Three times a day (TID) | ORAL | 0 refills | Status: DC

## 2020-12-07 MED ORDER — HEPARIN SODIUM (PORCINE) 1000 UNIT/ML IJ SOLN
INTRAMUSCULAR | Status: AC
  Administered 2020-12-07: 3200 [IU] via INTRAVENOUS
  Filled 2020-12-07: qty 4

## 2020-12-07 MED ORDER — LIDOCAINE 5 % EX PTCH: 1.0000 | MEDICATED_PATCH | CUTANEOUS | 0 refills | Status: DC

## 2020-12-07 MED ORDER — MIDODRINE HCL 10 MG PO TABS: 10.0000 mg | ORAL_TABLET | Freq: Three times a day (TID) | ORAL | 0 refills | Status: DC

## 2020-12-07 MED ORDER — PROSOURCE PLUS PO LIQD
30.0000 mL | Freq: Two times a day (BID) | ORAL | 0 refills | Status: DC
Start: 2020-12-07 — End: 2021-08-14

## 2020-12-07 MED ORDER — DOCUSATE SODIUM 50 MG/5ML PO LIQD: 100.0000 mg | Freq: Two times a day (BID) | ORAL | 1 refills | Status: DC

## 2020-12-07 NOTE — Discharge Instructions (Signed)
Inpatient Rehab Discharge Instructions  Raymond Hurley. Discharge date and time:  12/07/20  Activities/Precautions/ Functional Status: Activity: no lifting, driving, or strenuous exercise till cleared by MD Diet: renal diet--limit all fluids to 1200 cc/5 cups per day  Wound Care: Cleanse area on sacrum with soap and water, then cover with damp to dry dressing. Change daily or if more frequently if soiled.     Functional status:  ___ No restrictions     ___ Walk up steps independently _X__ 24/7 supervision/assistance   ___ Walk up steps with assistance ___ Intermittent supervision/assistance  ___ Bathe/dress independently ___ Walk with walker     ___ Bathe/dress with assistance ___ Walk Independently    ___ Shower independently ___ Walk with assistance    _X__ Shower with assistance _X__ No alcohol     ___ Return to work/school ________   Special Instructions: 1. Lie on the side when in bed for pressure relief. Boost every 20-30 minutes when in chair. 2. Need to drink at least 5 cups of water/fluid daily.     COMMUNITY REFERRALS UPON DISCHARGE:    Home Health:   PT, OT, RN, [place] Phone:[phone]  Medical Equipment/Items Ordered:WHEELCHAIR, [name] SUCTION MACHINE AND TUB BENCH                                                 Agency/Supplier: [facility]  [phone]  WIFE HAS HIRED [facility] CARE FOR CNA SERVICES-24/7 CARE  [facility] [facility] - T, TH, SAT CHAIR SLOT TIME: 11:10 AM   My questions have been answered and I understand these instructions. I will adhere to these goals and the provided educational materials after my discharge from the hospital.  Patient/Caregiver Signature _______________________________ Date __________  Clinician Signature _______________________________________ Date __________  Please bring this form and your medication list with you to all your follow-up doctor's  appointments.

## 2020-12-07 NOTE — Progress Notes (Signed)
Patient with hypotension post dialysis --was weak when he came back but feels better now. SBP down to 70's but he reports that he feels fine, not dizzy or lightheaded but was unable to stand to check standing BP. Wife insistent on d/c to home despite multiple attempts by multiple staff members regarding waiting for a few hours to see if BP recovers and if not may need IVF. Discussed with Dr. Posey Pronto and informed her that he would also like patient to stay to monitor for recovery v/s intervention and is against medical advise.  Wife declined and states that she plans on filling him with food and fluid after getting home, daughter in law is a Marine scientist and can handle him in addition ot hired help ad he'll do better at home. She was advised to call 911 for any acute changes including dizziness, CP or palpitations.

## 2020-12-07 NOTE — Progress Notes (Addendum)
[facility] PHYSICAL MEDICINE & REHABILITATION PROGRESS NOTE   Subjective/Complaints: Patient seen laying in bed this AM in HD.  He states he slept well overnight. He states he is worried more about his wife emotional state than himself.  Discussed healing wound with WOC.  Discussed with case manager wife's reluctancy for wound care, will have patient follow up in wound clinic. He was seen by Nephro yesterday, notes reviewed - transfused.   ROS: Denies CP, SOB, N/V/D.  Objective:   No results found. Recent Labs    12/05/20 1310 12/07/20 0707  WBC 10.0 9.5  HGB 7.9* 9.6*  HCT 26.2* 31.2*  PLT 168 PLATELET CLUMPS NOTED ON SMEAR, UNABLE TO ESTIMATE   Recent Labs    12/05/20 0535 12/07/20 0707  NA 134* 138  K 3.0* 3.5  CL 97* 102  CO2 22 23  GLUCOSE 85 109*  BUN 51* 49*  CREATININE 7.17* 5.98*  CALCIUM 8.2* 8.2*    Intake/Output Summary (Last 24 hours) at 12/07/2020 1712 Last data filed at 12/07/2020 1020 Gross per 24 hour  Intake 885 ml  Output 979 ml  Net -94 ml     Pressure Injury 11/10/20 Buttocks Right;Mid Unstageable - Full thickness tissue loss in which the base of the injury is covered by slough (yellow, tan, gray, green or brown) and/or eschar (tan, brown or black) in the wound bed. red open area of partial s (Active)  11/10/20 1721  Location: Buttocks  Location Orientation: Right;Mid  Staging: Unstageable - Full thickness tissue loss in which the base of the injury is covered by slough (yellow, tan, gray, green or brown) and/or eschar (tan, brown or black) in the wound bed.  Wound Description (Comments): red open area of partial skin loss with eschar covering wound bed  Present on Admission: Yes    Physical Exam: Vital Signs Blood pressure (!) 77/58, pulse (!) 109, temperature 98.4 F (36.9 C), resp. rate 18, height 5\' 10"  (1.778 m), weight 78 kg, SpO2 95 %.  Constitutional: No distress . Vital signs reviewed. HENT: Normocephalic.  Atraumatic. Eyes:  EOMI. No discharge. Cardiovascular: No JVD.  RRR. Respiratory: Normal effort.  No stridor.  Bilateral clear to auscultation. GI: Non-distended.  BS +. Skin: Warm and dry.   Coccyx ulcer Psych: Normal mood.  Normal behavior. Musc: Bilateral lower extremity edema, stable No tenderness in extremities. Neuro: Alert Motor: Bilateral upper extremities: 4+/5 throughout, stable Left lower extremity: Hip flexion, knee extension 4-/5, ankle dorsiflexion 4+/5, unchanged Right lower extremity: Hip flexion, knee extension 3 +-4-/5, ankle dorsiflexion 4+/5, stable  Assessment/Plan: 1. Functional deficits which require 3+ hours per day of interdisciplinary therapy in a comprehensive inpatient rehab setting.  Physiatrist is providing close team supervision and 24 hour management of active medical problems listed below.  Physiatrist and rehab team continue to assess barriers to discharge/monitor patient progress toward functional and medical goals  Care Tool:  Bathing    Body parts bathed by patient: Right arm,Left arm,Chest,Abdomen,Face,Front perineal area   Body parts bathed by helper: Buttocks     Bathing assist Assist Level: Minimal Assistance - Patient > 75%     Upper Body Dressing/Undressing Upper body dressing   What is the patient wearing?: Pull over shirt,Button up shirt    Upper body assist Assist Level: Supervision/Verbal cueing    Lower Body Dressing/Undressing Lower body dressing      What is the patient wearing?: Pants     Lower body assist Assist for lower body dressing: Minimal  Assistance - Patient > 75%     Toileting Toileting    Toileting assist Assist for toileting: Moderate Assistance - Patient 50 - 74%     Transfers Chair/bed transfer  Transfers assist     Chair/bed transfer assist level: Contact Guard/Touching assist Chair/bed transfer assistive device: Walker,Armrests   Locomotion Ambulation   Ambulation assist   Ambulation activity did not  occur: Safety/medical concerns  Assist level: Contact Guard/Touching assist Assistive device: Walker-rolling Max distance: 3ft   Walk 10 feet activity   Assist  Walk 10 feet activity did not occur: Safety/medical concerns  Assist level: Contact Guard/Touching assist Assistive device: Walker-rolling   Walk 50 feet activity   Assist Walk 50 feet with 2 turns activity did not occur: Safety/medical concerns (fatigue, generalized weakness, poor endurance)         Walk 150 feet activity   Assist Walk 150 feet activity did not occur: Safety/medical concerns (fatigue, generalized weakness, poor endurance)         Walk 10 feet on uneven surface  activity   Assist Walk 10 feet on uneven surfaces activity did not occur: Safety/medical concerns (fatigue, generalized weakness, poor endurance)         Wheelchair     Assist Will patient use wheelchair at discharge?: Yes Type of Wheelchair: Manual    Wheelchair assist level: Supervision/Verbal cueing Max wheelchair distance: 129ft    Wheelchair 50 feet with 2 turns activity    Assist        Assist Level: Supervision/Verbal cueing   Wheelchair 150 feet activity     Assist      Assist Level: Supervision/Verbal cueing     Medical Problem List and Plan: 1.  Generalized weakness affecting ability to stand or complete ADL tasks secondary to debility.  DC today  Will see patient for transitional care management in 1-2 weeks post-discharge  Previously discussed with wife plans for discharge and goals of care 2.  Antithrombotics: -DVT/anticoagulation:  Mechanical: Sequential compression devices, below knee Bilateral lower extremities due to GI bleed.             -antiplatelet therapy: N/A 3. Pain Management: Tylenol prn.   Controlled with meds on 2/24 4. Mood: LCSW to follow for evaluation and support.              -antipsychotic agents: N/a 5. Neuropsych: This patient is capable of making decisions  on his own behalf. 6. Skin/Wound Care: Routine pressure relief measures.   Wound looks good Wet to dry with Dakins solution covered by foamn dressing   -patient says he is not being repositioned every 2H, placed nursing order to request this. 7. Fluids/Electrolytes/Nutrition: Strict I/Os.  Labs with HD.discussed 1264ml fluid restriction . Megace started to stimulate appetite.  8.  ESRD: On midodrine prior to HD to help support BP.   Permanent access as outpatient  Appreciate nephro recs 9. Chronic systolic CHF/CAF: Strict I/Os. Fluid status managed with HD.              Daily weights  Lasix per Nephro, appreciate Recs  Amio per Cards recs  Filed Weights   12/03/20 0510 12/07/20 0700 12/07/20 1020  Weight: 72.1 kg 78.9 kg 78 kg   ?Stable on 2/24 10.  Pancreatitis: Has completed antibiotic course. Tolerating diet without nausea, diarrhea or abdominal pain.              Monitor closely.  Tolerating diet at present, variable p.o. intake 11. LGIB: Continue Carafate  ac/hs.  12. Acute on chronic anemia: On aranresp weekly.              Labs with HD, transfused on 2/5  Hemoglobin 9.6 on 2/24 13. Thrombocytopenia: Monitor for signs of bleeding.              Labs with HD  Resolved 14. Acute respiratory failure: Has resolved.   See #16 15.  Leukocytosis: Resolved  Afebrile 16.  Steroid-induced hyperglycemia: Resolved  SSI  Prednisone decreased to 20 on 2/4, decreased to 10 on 2/9, decreased to 5 on 2/15, DC'd on 2/19  Relatively controlled on 2/18, DC'd CBG checks   17.  Dizziness  Likely due to anemia and soft blood pressures and limited p.o. intake in accordance with HD, patient not drinking nearly up to 1200cc fluid restriction  Trying to drink more, discussed again  Continue midodrine  Improving after transfusion 18. Slow transit constipation:  Improving 19. Dysphagia   D3 thins, advance diet as tolerated  > 30 minutes spent in total in discharge planning between myself and PA  regarding aforementioned, as well discussion regarding DME equipment, follow-up appointments, follow-up therapies, discharge medications, discharge recommendations, answering questions.  Discussed with PA, pressures low post HD (slightly more than usual due to excessive fluid removal), however asymptomatic and wife would not like to stay for monitoring. Some BP variability expected, as has been discussed extensively.  Wife continues to refuse SNF and states she has processes in place for safe discharge. Please see discharge summary as well.  LOS: 27 days A FACE TO FACE EVALUATION WAS PERFORMED  Ankit Lorie Phenix 12/07/2020, 5:12 PM

## 2020-12-07 NOTE — Progress Notes (Signed)
Recreational Therapy Discharge Summary Patient Details  Name: Raymond Hurley. MRN: [id number] Date of Birth: 1931/05/27 Today's Date: 12/07/2020  Long term goals set: 1  Long term goals met: 1  Comments on progress toward goals: Pt met set up/supervision level for simple tasks seated level.  TR sessions focused on leisure education, activity analysis/modifications and discharge planning.  Pt is discharging home today with wife and hired assistance.  Reasons for discharge: discharge from hospital Patient/family agrees with progress made and goals achieved: Yes  SIMPSON,LISA 12/07/2020, 12:26 PM

## 2020-12-07 NOTE — Progress Notes (Signed)
[facility] ROUNDING NOTE   Subjective:  States he Got a unit of blood last night.  Feels ok today.  Seen and examined on dialysis.  Blood pressure 100/54 after coming out of UF - was 89/38 prior.  Had 1.479 off thus far RIJ tunn catheter.      Review of systems:  Has been weak Denies overt shortness of breath - has had low dose oxygen No n/v  --------  Brief history: This is a 87 year old gentleman who remained anuric oliguric following Covid infection.  He is now CIR requiring intensive physical therapy.  His baseline creatinine is 1.2.  However he has been dialysis dependent since admission and is now on a Tuesday Thursday Saturday dialysis schedule.  There is no recorded urine output.  Patient undergoing CLIP.  No definitive chair yet.   Objective:  Vital signs in last 24 hours:  Temp:  [97.4 F (36.3 C)-98.5 F (36.9 C)] 97.8 F (36.6 C) (02/24 0700) Pulse Rate:  [72-97] 80 (02/24 0945) Resp:  [15-18] 18 (02/24 0720) BP: (87-110)/(34-65) 89/38 (02/24 0945) SpO2:  [88 %-99 %] 92 % (02/24 0700) Weight:  [78.9 kg] 78.9 kg (02/24 0700)  Weight change:  Filed Weights   12/02/20 0537 12/03/20 0510 12/07/20 0700  Weight: 73.9 kg 72.1 kg 78.9 kg    Intake/Output: I/O last 3 completed shifts: In: 3976 [P.O.:710; I.V.:100; Other:315] Out: -    Intake/Output this shift:  Total I/O In: 240 [P.O.:240] Out: -   General:  lying in bed in NAD  Heart:S1S2 no rub; 1+ pedal edema  Lungs: clear and reduced; unlabored at rest Abdomen:soft, Non-tender, non-distended Extremities: no edema  Neuro - alert and oriented x 3 provides hx and follows commands Psych normal mood and affect Dialysis Access: Right IJ TDC in place    Basic Metabolic Panel: Recent Labs  Lab 12/02/20 1854 12/05/20 0535 12/07/20 0707  NA 136 134* 138  K 3.2* 3.0* 3.5  CL 97* 97* 102  CO2 21* 22 23  GLUCOSE 107* 85 109*  BUN 57* 51* 49*  CREATININE 7.99* 7.17* 5.98*  CALCIUM 8.7* 8.2*  8.2*  PHOS 2.4* 2.5 3.0    Liver Function Tests: Recent Labs  Lab 12/02/20 1854 12/05/20 0535 12/07/20 0707  ALBUMIN 2.5* 2.2* 2.1*   CBC: Recent Labs  Lab 12/02/20 1853 12/05/20 1310 12/07/20 0707  WBC 11.0* 10.0 9.5  HGB 7.5* 7.9* 9.6*  HCT 24.9* 26.2* 31.2*  MCV 103.3* 101.2* 98.7  PLT 179 168 PLATELET CLUMPS NOTED ON SMEAR, UNABLE TO ESTIMATE    Imaging: No results found.   Medications:   . sodium chloride     . (feeding supplement) PROSource Plus  30 mL Oral BID BM  . sodium chloride   Intravenous Once  . sodium chloride   Intravenous Once  . sodium chloride   Intravenous Once  . amiodarone  200 mg Oral BID   Followed by  . [START ON 12/08/2020] amiodarone  200 mg Oral Daily  . Chlorhexidine Gluconate Cloth  6 each Topical Q0600  . Chlorhexidine Gluconate Cloth  6 each Topical Q0600  . Chlorhexidine Gluconate Cloth  6 each Topical Q0600  . darbepoetin (ARANESP) injection - DIALYSIS  200 mcg Intravenous Q Tue-HD  . docusate  100 mg Oral BID  . feeding supplement (NEPRO CARB STEADY)  237 mL Oral TID WC  . Gerhardt's butt cream   Topical TID  . heparin sodium (porcine)      . lactobacillus  acidophilus & bulgar  1 tablet Oral TID WC  . lidocaine  1 patch Transdermal Q24H  . mouth rinse  15 mL Mouth Rinse BID  . megestrol  40 mg Oral Daily  . midodrine  10 mg Oral TID WC  . multivitamin  1 tablet Oral QHS  . sucralfate  1 g Oral TID WC & HS  . triamcinolone 0.1 % cream : eucerin   Topical BID   acetaminophen, bisacodyl, calcium carbonate, chlorpheniramine-HYDROcodone, diphenhydrAMINE, heparin sodium (porcine), loperamide, polyethylene glycol, prochlorperazine **OR** prochlorperazine **OR** prochlorperazine, Resource ThickenUp Clear, traZODone  Assessment/ Plan:  1. Acute kidney Injury now considered ESRD: Baseline creatinine 1.2 with severe ATN related to Covid and acute pancreatitis.  No signs of recovery at this time.  Planning for permanent access as an  outpatient once his strength is improved per charting.  - HD today per TTS schedule - has a spot at TTS at [facility] once discharged - starting there Sat.    2.Acute pancreatitis:This was noted on CT scan earlier (1/3 and 1/7). 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  Now status post treatment  3. Acute hypoxic respiratory failure:Secondary to Covid pneumonia and possible aspiration pneumonitis.  Continue supplemental oxygen as needed - anticipate will need to continue at home  4. Hypotension - with dizziness. No/minimal UF with HD on 2/22 planned.  On midodrine.  PRBC's as below   5. Anemia: Secondary to acute/critical illness and GI losses.on aranesp at 200 mcg every tuesday.  IV iron prohibitive because of very high ferritin level.  PRBC's on 2/22 with HD and on 2/23 as well.  Improved   6 Secondary hyperparathyroidism:stopped sevelamer - hypophos. PTH level 217 at goal.   Claudia Desanctis, MD 12/07/2020 10:11 AM

## 2020-12-07 NOTE — Discharge Summary (Addendum)
Physician Discharge Summary  Patient ID: Raymond Hurley. MRN: [id number] DOB/AGE: 12-07-1930 87 y.o.  Admit date: 11/10/2020 Discharge date: 12/07/2020  Discharge Diagnoses:  Principal Problem:   Debility Active Problems:   PAF (paroxysmal atrial fibrillation) (HCC)   NICM (nonischemic cardiomyopathy) (HCC)   Acute on chronic anemia   ESRD on dialysis [facility])   Palliative care by specialist   Dizziness and giddiness   Slow transit constipation   Hemodialysis-associated hypotension   Protein-calorie malnutrition, severe   Dysphagia   Discharged Condition:  Stable   Significant Diagnostic Studies: No results found.  Labs:  Basic Metabolic Panel: Recent Labs  Lab 12/02/20 1854 12/05/20 0535 12/07/20 0707  NA 136 134* 138  K 3.2* 3.0* 3.5  CL 97* 97* 102  CO2 21* 22 23  GLUCOSE 107* 85 109*  BUN 57* 51* 49*  CREATININE 7.99* 7.17* 5.98*  CALCIUM 8.7* 8.2* 8.2*  PHOS 2.4* 2.5 3.0    CBC: Recent Labs  Lab 12/02/20 1853 12/05/20 1310 12/07/20 0707  WBC 11.0* 10.0 9.5  HGB 7.5* 7.9* 9.6*  HCT 24.9* 26.2* 31.2*  MCV 103.3* 101.2* 98.7  PLT 179 168 PLATELET CLUMPS NOTED ON SMEAR, UNABLE TO ESTIMATE    CBG: Recent Labs  Lab 12/06/20 1200 12/06/20 1638 12/06/20 2120 12/07/20 0635 12/07/20 1200  GLUCAP 118* 119* 118* 82 85    Brief HPI:   Raymond Lunz. is a 87 y.o. male with history of melanoma, vitamin B12 deficiency, A. fib-on Eliquis was admitted on 10/14/2020 with acute right facial droop, dysarthria and difficulty following commands. Family reported patient with recent diagnosis of Covid with diarrhea and poor p.o. intake. MRI brain was negative for acute process and he was found to have septic shock due to multifocal pneumonia as well as severe pancreatitis with concerns of developing abscess. Dr. Alessandra Bevels was consulted for input and recommended supportive care as well as course of meropenem and linezolid. Hospital course was significant for  acute renal failure requiring CVVHD and as he had poor renal recovery he was transitioned to hemodialysis, GI bleed with hematochezia requiring multiple transfusions, encephalopathy as well as A. fib with RVR which was managed with addition of amiodarone. Mentation was improving however he was noted to have significant debility with generalized weakness affecting ability to stand to complete ADL tasks. CIR was recommended due to functional decline.   Hospital Course: Raymond Hurley. was admitted to rehab 11/10/2020 for inpatient therapies to consist of PT, ST and OT at least three hours five days a week. Past admission physiatrist, therapy team and rehab RN have worked together to provide customized collaborative inpatient rehab.Blood pressures were monitored on TID basis and and he continued to have issues with hypotension. Abdominal binder was used for BP support. His intake has been poor and he was advised to drink at least 1200 cc of fluid per day to maintain hydration.  He continues on midodrine for BP support and did require 500 cc fluid bolus on 02/21. Acute on chronic anemia was treated with 1 unit PRBC on 02/22 and 02/23 with improvement in H/H.  Follow-up CBC showed reactive leukocytosis has resolved. His heart rate has been controlled and amiodarone is being weaned down to 200 mg a day.  Blood sugars were monitored due to stress-induced hyperglycemia and were DC'd on 02/19 as steroids were weaned off. He was found to have sacral decub which was treated with hydrotherapy to cleanse the wound bed this was followed by  Santyl with improvement and currently damp to dry dressing changes are being done daily for wound care. Protein supplements were offered twice daily to help promote wound healing. He did report problems with dysphagia and was evaluated by speech therapy with recommendations of dysphagia 3 diet with recommendations for advancement as tolerated. Palliative care has been following for  support and education regarding community-based palliative services and will follow up with patient on outpatient basis.   Rehab course: During patient's stay in rehab weekly team conferences were held to monitor patient's progress, set goals and discuss barriers to discharge. At admission, patient required mod assist with mobility and with basic ADL tasks.He  has had improvement in activity tolerance, balance, postural control as well as ability to compensate for deficits. He requires min assist with bathing and lower body dressing as well as min to mod assist for toileting. He requires contact-guard assist for transfers and is able to ambulate 30 feet with rolling walker and contact-guard assist. Family education was completed with wife regarding all aspects of safety and care.    Discharge disposition: 01-Home or Self Care  Diet: Renal diet/1200 cc FR/day   Special Instructions: 1. Continue damp to dry dressing to sacrum daily and more frequently if soiled. 2. Boost every 20 minutes when in chair and sideline when in bed for pressure relief measures. 3. Need to increase fluid intake to 1200 cc daily. Wear abdominal binder for BP support when out of bed.   Discharge Instructions     Ambulatory referral to Physical Medicine Rehab   Complete by: As directed    2 week follow up      Allergies as of 12/07/2020       Reactions   Povidone-iodine Swelling   Tape Swelling   Bee Venom Rash   Covid-19 Mrna Vacc (moderna) Hives, Rash   Rash came after booster shot. Pt was fine with 1st and 2nd        Medication List     STOP taking these medications    predniSONE 20 MG tablet Commonly known as: DELTASONE   triamcinolone 0.1 % Commonly known as: KENALOG Replaced by: triamcinolone 0.025 % ointment       TAKE these medications    (feeding supplement) PROSource Plus liquid Take 30 mLs by mouth 2 (two) times daily between meals.   acetaminophen 325 MG tablet Commonly known  as: TYLENOL Take 1-2 tablets (325-650 mg total) by mouth every 4 (four) hours as needed for mild pain. What changed:  medication strength how much to take when to take this reasons to take this   amiodarone 200 MG tablet Commonly known as: Pacerone Take one pill twice a day till 02/25. Starting on 02/26 decrease to one pill daily. What changed:  how much to take how to take this when to take this additional instructions   calcium carbonate 500 MG chewable tablet Commonly known as: TUMS - dosed in mg elemental calcium Chew 1 tablet (200 mg of elemental calcium total) by mouth 2 (two) times daily as needed for indigestion or heartburn.   docusate 50 MG/5ML liquid Commonly known as: COLACE Take 10 mLs (100 mg total) by mouth 2 (two) times daily.   lactobacillus acidophilus & bulgar chewable tablet Chew 1 tablet by mouth 3 (three) times daily with meals.   lidocaine 5 % Commonly known as: LIDODERM Place 1 patch onto the skin daily. Remove & Discard patch within 12 hours or as directed by MD Notes to patient:  Apply to lower back at 8 am and remove at 8 pm daily--You can use as needed for pain. Has to be off for 12 hours.    megestrol 40 MG tablet Commonly known as: MEGACE Take 1 tablet (40 mg total) by mouth daily.   midodrine 10 MG tablet Commonly known as: PROAMATINE Take 1 tablet (10 mg total) by mouth 3 (three) times daily. What changed: when to take this   multivitamin Tabs tablet Take 1 tablet by mouth at bedtime.   sucralfate 1 GM/10ML suspension Commonly known as: CARAFATE Take 10 mLs (1 g total) by mouth 4 (four) times daily -  with meals and at bedtime.   triamcinolone 0.025 % ointment Commonly known as: KENALOG Apply 1 application topically 2 (two) times daily. To areas of rash/itching--apply thin layer. Replaces: triamcinolone 0.1 %        Follow-up Information     Jamse Arn, MD Follow up.   Specialty: Physical Medicine and  Rehabilitation Why: office will call you with follow up appointment Contact information: [address] [place] [address] [phone]         Lajean Manes, MD. Call.   Specialty: Internal Medicine Why: for post hospital follow up Contact information: [address] [place] [address] [place] [address] [phone]         Lelon Perla, MD. Call.   Specialty: Cardiology Contact information: [address] [place] [address] [phone]         [facility] Follow up on 01/12/2021.   Specialty: Wound Care Why: APPOINTMENT @ 1:15 PM Will call if cancellation prior to this date. Contact information: [place], [address] [id number] [place]        Otis Brace, MD. Call.   Specialty: Gastroenterology Why: for follow up on GI issues Contact information: [address] [place] [address] [phone]                 Signed: Bary Leriche 12/08/2020, 5:52 PM Patient was seen, face-face, and physical exam performed by me on day of discharge, greater than 30 minutes of total time spent.. Please see progress note from day of discharge as well.  Delice Lesch, MD, ABPMR

## 2020-12-08 ENCOUNTER — Telehealth: Payer: Self-pay | Admitting: *Deleted

## 2020-12-08 ENCOUNTER — Encounter (HOSPITAL_COMMUNITY): Payer: Self-pay | Admitting: Physical Medicine & Rehabilitation

## 2020-12-08 DIAGNOSIS — L89313 Pressure ulcer of right buttock, stage 3: Secondary | ICD-10-CM | POA: Diagnosis not present

## 2020-12-08 DIAGNOSIS — R471 Dysarthria and anarthria: Secondary | ICD-10-CM | POA: Diagnosis not present

## 2020-12-08 DIAGNOSIS — Z992 Dependence on renal dialysis: Secondary | ICD-10-CM | POA: Diagnosis not present

## 2020-12-08 DIAGNOSIS — Z79818 Long term (current) use of other agents affecting estrogen receptors and estrogen levels: Secondary | ICD-10-CM | POA: Diagnosis not present

## 2020-12-08 DIAGNOSIS — I959 Hypotension, unspecified: Secondary | ICD-10-CM | POA: Diagnosis not present

## 2020-12-08 DIAGNOSIS — I5022 Chronic systolic (congestive) heart failure: Secondary | ICD-10-CM | POA: Diagnosis not present

## 2020-12-08 DIAGNOSIS — G9340 Encephalopathy, unspecified: Secondary | ICD-10-CM | POA: Diagnosis not present

## 2020-12-08 DIAGNOSIS — N179 Acute kidney failure, unspecified: Secondary | ICD-10-CM | POA: Diagnosis not present

## 2020-12-08 DIAGNOSIS — Z85820 Personal history of malignant melanoma of skin: Secondary | ICD-10-CM | POA: Diagnosis not present

## 2020-12-08 DIAGNOSIS — I2510 Atherosclerotic heart disease of native coronary artery without angina pectoris: Secondary | ICD-10-CM | POA: Diagnosis not present

## 2020-12-08 DIAGNOSIS — E538 Deficiency of other specified B group vitamins: Secondary | ICD-10-CM | POA: Diagnosis not present

## 2020-12-08 DIAGNOSIS — J690 Pneumonitis due to inhalation of food and vomit: Secondary | ICD-10-CM | POA: Diagnosis not present

## 2020-12-08 DIAGNOSIS — K5901 Slow transit constipation: Secondary | ICD-10-CM | POA: Diagnosis not present

## 2020-12-08 DIAGNOSIS — D631 Anemia in chronic kidney disease: Secondary | ICD-10-CM | POA: Diagnosis not present

## 2020-12-08 DIAGNOSIS — I4891 Unspecified atrial fibrillation: Secondary | ICD-10-CM | POA: Diagnosis not present

## 2020-12-08 DIAGNOSIS — Z9181 History of falling: Secondary | ICD-10-CM | POA: Diagnosis not present

## 2020-12-08 DIAGNOSIS — R1310 Dysphagia, unspecified: Secondary | ICD-10-CM | POA: Diagnosis not present

## 2020-12-08 DIAGNOSIS — N186 End stage renal disease: Secondary | ICD-10-CM | POA: Diagnosis not present

## 2020-12-08 DIAGNOSIS — Z8616 Personal history of COVID-19: Secondary | ICD-10-CM | POA: Diagnosis not present

## 2020-12-08 NOTE — Telephone Encounter (Signed)
Oretha Milch, RN, Alvis Lemmings is asking for an Rx  to be sent to [facility] for Pro Source Liquid Plus to help treat an unstageable pressure ulcer right buttock. Asking for 2, 30 oz bottles with instructions to administer 30 ml BID. She gave [place] a fax number where the script can be sent, [phone].  If approved, we can fill out script and stamp providers signature and fax to supplier.

## 2020-12-08 NOTE — Telephone Encounter (Signed)
At the time of discharge, it was not unstageable and healing well with good granulation tissue.  I spoke to our wound care nurse as well.  None the less, we can order/approve it.  Thanks.

## 2020-12-09 DIAGNOSIS — N186 End stage renal disease: Secondary | ICD-10-CM | POA: Diagnosis not present

## 2020-12-09 DIAGNOSIS — D631 Anemia in chronic kidney disease: Secondary | ICD-10-CM | POA: Diagnosis not present

## 2020-12-09 DIAGNOSIS — N2581 Secondary hyperparathyroidism of renal origin: Secondary | ICD-10-CM | POA: Diagnosis not present

## 2020-12-09 DIAGNOSIS — Z992 Dependence on renal dialysis: Secondary | ICD-10-CM | POA: Diagnosis not present

## 2020-12-11 ENCOUNTER — Telehealth: Payer: Self-pay | Admitting: Cardiology

## 2020-12-11 ENCOUNTER — Telehealth: Payer: Self-pay | Admitting: Registered Nurse

## 2020-12-11 DIAGNOSIS — N186 End stage renal disease: Secondary | ICD-10-CM | POA: Diagnosis not present

## 2020-12-11 DIAGNOSIS — I4891 Unspecified atrial fibrillation: Secondary | ICD-10-CM | POA: Diagnosis not present

## 2020-12-11 DIAGNOSIS — L89313 Pressure ulcer of right buttock, stage 3: Secondary | ICD-10-CM | POA: Diagnosis not present

## 2020-12-11 DIAGNOSIS — I5022 Chronic systolic (congestive) heart failure: Secondary | ICD-10-CM | POA: Diagnosis not present

## 2020-12-11 DIAGNOSIS — I2510 Atherosclerotic heart disease of native coronary artery without angina pectoris: Secondary | ICD-10-CM | POA: Diagnosis not present

## 2020-12-11 DIAGNOSIS — D631 Anemia in chronic kidney disease: Secondary | ICD-10-CM | POA: Diagnosis not present

## 2020-12-11 NOTE — Telephone Encounter (Signed)
Pt c/o medication issue:  1. Name of Medication:  apixaban (ELIQUIS) tablet 5 mg metoprolol succinate (TOPROL XL) 25 MG 24 hr tablet  sacubitril-valsartan (ENTRESTO) 24-26 MG   2. How are you currently taking this medication (dosage and times per day)? Patient not taking any of these medications   3. Are you having a reaction (difficulty breathing--STAT)?   4. What is your medication issue? Wife of patient states these three medications were d/c during the patient's hospital stay 10/14/20 to 12/07/20. The wife wanted to address with DR. Crenshaw whether or not the patient should go back on these medications. The patient was scheduled for an appointment 01/02/21 but the patient has Dialysis T/Th/Sat and had to re-schedule to June. The wife could not wait that long to address this concern.  Please advise

## 2020-12-11 NOTE — Telephone Encounter (Signed)
Transitional Care call Raymond Hurley answered the Transitions of care  Patient name: Raymond Hurley DOB: 1931-02-14 1. Are you/is patient experiencing any problems since coming home? No a. Are there any questions regarding any aspect of care? No 2. Are there any questions regarding medications administration/dosing? No a. Are meds being taken as prescribed? All except prosource, she is awaiting on delivery, she reports.  b. "Patient should review meds with caller to confirm"Medication List Reviewed.   3. Have there been any falls? No 4. Has Home Health been to the house and/or have they contacted you? Yes. [facility] a. If not, have you tried to contact them? NA b. Can we help you contact them? NA 5. Are bowels and bladder emptying properly? Yes a. Are there any unexpected incontinence issues? No b. If applicable, is patient following bowel/bladder programs? (                          ) 6. Any fevers, problems with breathing, unexpected pain? No 7. Are there any skin problems or new areas of breakdown? He has a sacral decubitus: Raymond Hurley Following.  8. Has the patient/family member arranged specialty MD follow up (ie cardiology/neurology/renal/surgical/etc.)?  Raymond Hurley will be calling Dr. Alessandra Bevels to schedule HFU she states. All other appointments are scheduled.  a. Can we help arrange? No 9. Does the patient need any other services or support that we can help arrange? No 10. Are caregivers following through as expected in assisting the patient? yes 11. Has the patient quit smoking, drinking alcohol, or using drugs as recommended? (                        )  Appointment date/time 12/18/2020  arrival time 2:00 for 2:20 appointment with Bayard Hugger ANP-C. At [facility]

## 2020-12-11 NOTE — Telephone Encounter (Signed)
Spoke with pt's wife who report prior to recent admission pt was taking Eliquis, Metoprolol, and Entresto but noticed it was not on discharge instruction. Wife is questioning whether Dr. Stanford Breed feel pt should restart medication. BP and HR for today listed below.  7:30 am- 89/65 HR 88 11 am- 73/59 HR 94 12 am- 112/68 HR 90 4:45 pm-117/70 HR 85

## 2020-12-11 NOTE — Telephone Encounter (Signed)
Script sent to [facility]

## 2020-12-12 DIAGNOSIS — N186 End stage renal disease: Secondary | ICD-10-CM | POA: Diagnosis not present

## 2020-12-12 DIAGNOSIS — Z992 Dependence on renal dialysis: Secondary | ICD-10-CM | POA: Diagnosis not present

## 2020-12-12 DIAGNOSIS — D631 Anemia in chronic kidney disease: Secondary | ICD-10-CM | POA: Diagnosis not present

## 2020-12-12 DIAGNOSIS — T8249XD Other complication of vascular dialysis catheter, subsequent encounter: Secondary | ICD-10-CM | POA: Diagnosis not present

## 2020-12-12 DIAGNOSIS — I129 Hypertensive chronic kidney disease with stage 1 through stage 4 chronic kidney disease, or unspecified chronic kidney disease: Secondary | ICD-10-CM | POA: Diagnosis not present

## 2020-12-12 DIAGNOSIS — N2581 Secondary hyperparathyroidism of renal origin: Secondary | ICD-10-CM | POA: Diagnosis not present

## 2020-12-12 NOTE — Telephone Encounter (Signed)
Spoke with pt wife, Aware of dr crenshaw's recommendations.  Follow up scheduled  

## 2020-12-12 NOTE — Telephone Encounter (Signed)
Would not resume any of these meds at this point; needs fu appov Kirk Ruths

## 2020-12-14 DIAGNOSIS — Z992 Dependence on renal dialysis: Secondary | ICD-10-CM | POA: Diagnosis not present

## 2020-12-14 DIAGNOSIS — N186 End stage renal disease: Secondary | ICD-10-CM | POA: Diagnosis not present

## 2020-12-14 DIAGNOSIS — N2581 Secondary hyperparathyroidism of renal origin: Secondary | ICD-10-CM | POA: Diagnosis not present

## 2020-12-14 DIAGNOSIS — T8249XD Other complication of vascular dialysis catheter, subsequent encounter: Secondary | ICD-10-CM | POA: Diagnosis not present

## 2020-12-14 DIAGNOSIS — D631 Anemia in chronic kidney disease: Secondary | ICD-10-CM | POA: Diagnosis not present

## 2020-12-15 ENCOUNTER — Telehealth: Payer: Self-pay | Admitting: Cardiology

## 2020-12-15 DIAGNOSIS — N186 End stage renal disease: Secondary | ICD-10-CM | POA: Diagnosis not present

## 2020-12-15 DIAGNOSIS — D631 Anemia in chronic kidney disease: Secondary | ICD-10-CM | POA: Diagnosis not present

## 2020-12-15 DIAGNOSIS — I2510 Atherosclerotic heart disease of native coronary artery without angina pectoris: Secondary | ICD-10-CM | POA: Diagnosis not present

## 2020-12-15 DIAGNOSIS — L89313 Pressure ulcer of right buttock, stage 3: Secondary | ICD-10-CM | POA: Diagnosis not present

## 2020-12-15 DIAGNOSIS — I4891 Unspecified atrial fibrillation: Secondary | ICD-10-CM | POA: Diagnosis not present

## 2020-12-15 DIAGNOSIS — I5022 Chronic systolic (congestive) heart failure: Secondary | ICD-10-CM | POA: Diagnosis not present

## 2020-12-15 NOTE — Telephone Encounter (Signed)
Clair Gulling advised per recent hospital instructions, midodrine is prescribed as 10 mg TID. Clair Gulling verbalized understanding.

## 2020-12-15 NOTE — Telephone Encounter (Signed)
    Pt c/o medication issue:  1. Name of Medication:   midodrine (PROAMATINE) 10 MG tablet   2. How are you currently taking this medication (dosage and times per day)? Take 1 tablet (10 mg total) by mouth 3 (three) times daily.  3. Are you having a reaction (difficulty breathing--STAT)?   4. What is your medication issue? Clair Gulling with [facility] home health would like to confirm how many times a day pt needs to take this medication.  Also, he said he will fax BP readings that pt's family took, he said they need guidance how many times they need to take a day because family checking it 6-7 times a day. Also they noticed there is a significant difference on pt's BP from right to left arm.

## 2020-12-16 DIAGNOSIS — N2581 Secondary hyperparathyroidism of renal origin: Secondary | ICD-10-CM | POA: Diagnosis not present

## 2020-12-16 DIAGNOSIS — T8249XD Other complication of vascular dialysis catheter, subsequent encounter: Secondary | ICD-10-CM | POA: Diagnosis not present

## 2020-12-16 DIAGNOSIS — Z992 Dependence on renal dialysis: Secondary | ICD-10-CM | POA: Diagnosis not present

## 2020-12-16 DIAGNOSIS — D631 Anemia in chronic kidney disease: Secondary | ICD-10-CM | POA: Diagnosis not present

## 2020-12-16 DIAGNOSIS — N186 End stage renal disease: Secondary | ICD-10-CM | POA: Diagnosis not present

## 2020-12-18 ENCOUNTER — Other Ambulatory Visit: Payer: Self-pay

## 2020-12-18 ENCOUNTER — Encounter: Payer: Medicare Other | Attending: Registered Nurse | Admitting: Registered Nurse

## 2020-12-18 VITALS — BP 99/67 | HR 95 | Temp 97.7°F | Ht 68.0 in

## 2020-12-18 DIAGNOSIS — I4891 Unspecified atrial fibrillation: Secondary | ICD-10-CM | POA: Diagnosis not present

## 2020-12-18 DIAGNOSIS — Z992 Dependence on renal dialysis: Secondary | ICD-10-CM | POA: Diagnosis not present

## 2020-12-18 DIAGNOSIS — I5022 Chronic systolic (congestive) heart failure: Secondary | ICD-10-CM

## 2020-12-18 DIAGNOSIS — I428 Other cardiomyopathies: Secondary | ICD-10-CM | POA: Insufficient documentation

## 2020-12-18 DIAGNOSIS — I480 Paroxysmal atrial fibrillation: Secondary | ICD-10-CM | POA: Diagnosis not present

## 2020-12-18 DIAGNOSIS — L89313 Pressure ulcer of right buttock, stage 3: Secondary | ICD-10-CM | POA: Diagnosis not present

## 2020-12-18 DIAGNOSIS — N186 End stage renal disease: Secondary | ICD-10-CM | POA: Diagnosis not present

## 2020-12-18 DIAGNOSIS — D631 Anemia in chronic kidney disease: Secondary | ICD-10-CM | POA: Diagnosis not present

## 2020-12-18 DIAGNOSIS — I2510 Atherosclerotic heart disease of native coronary artery without angina pectoris: Secondary | ICD-10-CM | POA: Diagnosis not present

## 2020-12-18 DIAGNOSIS — R5381 Other malaise: Secondary | ICD-10-CM | POA: Insufficient documentation

## 2020-12-18 NOTE — Progress Notes (Signed)
Subjective:    Patient ID: Raymond Scales., male    DOB: 02-03-1931, 87 y.o.   MRN: [id number]  HPI: Raymond Jabs. is a 87 y.o. male who is here for Transitional Care Visit, for follow up of his Debility, ESRD on hemodialysis, Paroxysmal Atrial Fibrillation and Non ischemic Cardiomyopathy.  Raymond Hurley was brought to [facility] via EMS , with acute right facial droop. He was presented as a code stroke and evaluated by neurology.  CT Head WO Contrast:  IMPRESSION: 1. Normal head CT. MR Brain WO Contrast: MR Angio Head IMPRESSION: 1. Negative for acute infarct. Mild chronic microvascular ischemic change in the white matter. 2. Negative MRA head  DG Chest:  IMPRESSION: Patchy left lung base opacity, suggesting aspiration or pneumonia.  Raymond Hurley was admitted to inpatient Rehabilitation on 11/10/2020 and discharged home on 12/07/2020. He is receiving home health therapy from [facility]. He reports he has generalized pain. He rated his pain on health and history 0.  Raymond Hurley reports he's able to stand and transfer from bed to wheelchair only. He has 24/7 care, she hired a CNA to assist in his care.   He arrived in wheelchair   Pain Inventory Average Pain 0 Pain Right Now 0 My pain is no pain  In the last 24 hours, has pain interfered with the following? General activity 0 Relation with others 0 Enjoyment of life 0 What TIME of day is your pain at its worst? No pain Sleep (in general) NA  Pain is worse with: no pain Pain improves with: no pain Relief from Meds: 0  ability to climb steps?  no do you drive?  no use a wheelchair needs help with transfers Do you have any goals in this area?  yes  employed # of hrs/week . I need assistance with the following:  feeding, dressing, bathing, toileting, meal prep, household duties and shopping Do you have any goals in this area?  yes  weakness trouble walking dizziness loss of taste or  smell  transitional  transitional    Family History  Problem Relation Age of Onset  . Melanoma Daughter    Social History   Socioeconomic History  . Marital status: Married    Spouse name: Not on file  . Number of children: Not on file  . Years of education: Not on file  . Highest education level: Not on file  Occupational History  . Not on file  Tobacco Use  . Smoking status: Never Smoker  . Smokeless tobacco: Never Used  Vaping Use  . Vaping Use: Never used  Substance and Sexual Activity  . Alcohol use: Yes    Alcohol/week: 7.0 standard drinks    Types: 7 Glasses of wine per week  . Drug use: Never  . Sexual activity: Not on file  Other Topics Concern  . Not on file  Social History Narrative  . Not on file   Social Determinants of Health   Financial Resource Strain: Not on file  Food Insecurity: Not on file  Transportation Needs: Not on file  Physical Activity: Not on file  Stress: Not on file  Social Connections: Not on file   Past Surgical History:  Procedure Laterality Date  . CARDIOVERSION N/A 06/02/2019   Procedure: CARDIOVERSION;  Surgeon: Jerline Pain, MD;  Location[facility] ENDOSCOPY;  Service: Cardiovascular;  Laterality: N/A;  . IR FLUORO GUIDE CV LINE RIGHT  11/06/2020  . IR US GUIDE VASC ACCESS  RIGHT  11/06/2020  . RIGHT/LEFT HEART CATH AND CORONARY ANGIOGRAPHY N/A 04/27/2019   Procedure: RIGHT/LEFT HEART CATH AND CORONARY ANGIOGRAPHY;  Surgeon: Jolaine Artist, MD;  Location: [place] CV LAB;  Service: Cardiovascular;  Laterality: N/A;  . TEE WITHOUT CARDIOVERSION N/A 06/02/2019   Procedure: TRANSESOPHAGEAL ECHOCARDIOGRAM (TEE);  Surgeon: Jerline Pain, MD;  Location: [facility] ENDOSCOPY;  Service: Cardiovascular;  Laterality: N/A;   Past Medical History:  Diagnosis Date  . Acute pancreatitis   . Atrial fibrillation ([place])   . CAD (coronary artery disease) 04/2019  . CHF (congestive heart failure) ([place])   . Melanoma ([place])   . Vitamin B 12  deficiency    BP 99/67   Pulse 95   Temp 97.7 F (36.5 C)   SpO2 97%   Opioid Risk Score:   Fall Risk Score:  `1  Depression screen PHQ 2/9  Depression screen PHQ 2/9 12/18/2020  Down, Depressed, Hopeless 0  PHQ - 2 Score 0  Altered sleeping 0  Tired, decreased energy 3  Change in appetite 3  Feeling bad or failure about yourself  0  Trouble concentrating 1  Moving slowly or fidgety/restless 2  Suicidal thoughts 0  PHQ-9 Score 9    Review of Systems  Constitutional: Positive for appetite change and unexpected weight change.  HENT: Negative.   Eyes: Negative.   Respiratory: Negative.   Cardiovascular: Negative.   Gastrointestinal: Negative.   Endocrine: Negative.        Low blood sugar  Genitourinary: Positive for difficulty urinating.  Musculoskeletal: Positive for gait problem.  Skin: Negative.   Allergic/Immunologic: Negative.   Neurological: Positive for dizziness and weakness.  Psychiatric/Behavioral: Negative.   All other systems reviewed and are negative.      Objective:   Physical Exam Vitals and nursing note reviewed.  Constitutional:      Appearance: Normal appearance.  Cardiovascular:     Rate and Rhythm: Normal rate and regular rhythm.     Pulses: Normal pulses.     Heart sounds: Normal heart sounds.  Pulmonary:     Effort: Pulmonary effort is normal.     Breath sounds: Normal breath sounds.  Musculoskeletal:     Cervical back: Normal range of motion and neck supple.     Comments: Normal Muscle Bulk and Muscle Testing Reveals:  Upper Extremities: Full ROM and Muscle Strength 5/5  Lower Extremities: Decreased ROM and Muscle Strength 3/5 Arrived in wheelchair   Skin:    General: Skin is warm and dry.  Neurological:     Mental Status: He is alert and oriented to person, place, and time.           Assessment & Plan:  1. Debility: Continue Home health Therapy with [facility]. 2 ESRD on hemodialysis: Nephrology Following.  3.  Paroxysmal Atrial Fibrillation and Non ischemic Cardiomyopathy. Cardiology Following. Continue current medication regimen.   F/U with Dr Posey Pronto in 4- 6 weeks

## 2020-12-19 ENCOUNTER — Encounter: Payer: Medicare Other | Admitting: Registered Nurse

## 2020-12-19 DIAGNOSIS — Z992 Dependence on renal dialysis: Secondary | ICD-10-CM | POA: Diagnosis not present

## 2020-12-19 DIAGNOSIS — D631 Anemia in chronic kidney disease: Secondary | ICD-10-CM | POA: Diagnosis not present

## 2020-12-19 DIAGNOSIS — N2581 Secondary hyperparathyroidism of renal origin: Secondary | ICD-10-CM | POA: Diagnosis not present

## 2020-12-19 DIAGNOSIS — T8249XD Other complication of vascular dialysis catheter, subsequent encounter: Secondary | ICD-10-CM | POA: Diagnosis not present

## 2020-12-19 DIAGNOSIS — N186 End stage renal disease: Secondary | ICD-10-CM | POA: Diagnosis not present

## 2020-12-20 ENCOUNTER — Encounter: Payer: Self-pay | Admitting: Cardiology

## 2020-12-20 ENCOUNTER — Other Ambulatory Visit: Payer: Self-pay

## 2020-12-20 ENCOUNTER — Ambulatory Visit (INDEPENDENT_AMBULATORY_CARE_PROVIDER_SITE_OTHER): Payer: Medicare Other | Admitting: Cardiology

## 2020-12-20 VITALS — BP 92/6 | HR 99 | Ht 69.0 in | Wt 163.0 lb

## 2020-12-20 DIAGNOSIS — I428 Other cardiomyopathies: Secondary | ICD-10-CM

## 2020-12-20 DIAGNOSIS — I953 Hypotension of hemodialysis: Secondary | ICD-10-CM

## 2020-12-20 DIAGNOSIS — I480 Paroxysmal atrial fibrillation: Secondary | ICD-10-CM

## 2020-12-20 NOTE — Patient Instructions (Signed)
Medication Instructions:  No Changes *If you need a refill on your cardiac medications before your next appointment, please call your pharmacy*   Lab Work: No Labs If you have labs (blood work) drawn today and your tests are completely normal, you will receive your results only by: Marland Kitchen MyChart Message (if you have MyChart) OR . A paper copy in the mail If you have any lab test that is abnormal or we need to change your treatment, we will call you to review the results.   Testing/Procedures: No Testing   Follow-Up: At [facility], you and your health needs are our priority.  As part of our continuing mission to provide you with exceptional heart care, we have created designated Provider Care Teams.  These Care Teams include your primary Cardiologist (physician) and Advanced Practice Providers (APPs -  Physician Assistants and Nurse Practitioners) who all work together to provide you with the care you need, when you need it.      Your next appointment:   Mar 02, 2021 2:40 PM  The format for your next appointment:   In Person  Provider:   Kirk Ruths, MD

## 2020-12-20 NOTE — Progress Notes (Signed)
Cardiology Office Note:    Date:  12/20/2020   ID:  Raymond Scales., DOB December 21, 1930, MRN [id number]  PCP:  Lajean Manes, MD  Cardiologist:  Kirk Ruths, MD  Electrophysiologist:  None   Referring MD: Lajean Manes, MD   No chief complaint on file.   History of Present Illness:    Raymond Wishon. is a 87 y.o. male with a hx of PAF and NICM.  Patient has a history of an EF of 20% in July 2020.  Catheterization then showed no significant coronary disease.  It was felt his symptoms were secondary to atrial fibrillation atrial flutter.  He ultimately underwent TEE cardioversion in August 2020 to sinus rhythm.  Follow-up echocardiogram November 2020 showed improvement in his ejection fraction to 45%.  He saw Dr. Stanford Breed in May 2021 and was doing well.  He was in the office 07/05/2020 for routine follow up.  He asked about Systems analyst, he has been a Insurance underwriter for many years.   The patient was then admitted 10/14/2020.  He apparently had contracted CO VID though he was vaccinated and boosted.  On admission he had acute pancreatitis complicated by shock.  He had a long hospital course complicated by acute kidney injury and renal failure, GI bleeding, respiratory failure, and PAF.  Cardiology saw him for atrial fibrillation.  He was placed on amiodarone.  The patient was discharged to rehab and eventually sent home December 07, 2020.    He is seen in the office today for follow-up.  He is in a wheelchair and looks frail.  He has not been able to do much at home.  He is on dialysis Tuesday Thursday and Saturday at the [facility].  His blood pressures have been consistently low with systolics in the 23J.  He is on amiodarone 200 mg a day and ProAmatine 10 mg a day.  His heart rhythm today in the office is sinus with a rate of 98.   Past Medical History:  Diagnosis Date  . Acute pancreatitis   . Atrial fibrillation ([place])   . CAD (coronary artery disease)  04/2019  . CHF (congestive heart failure) ([place])   . Melanoma ([place])   . Vitamin B 12 deficiency     Past Surgical History:  Procedure Laterality Date  . CARDIOVERSION N/A 06/02/2019   Procedure: CARDIOVERSION;  Surgeon: Jerline Pain, MD;  Location: [facility] ENDOSCOPY;  Service: Cardiovascular;  Laterality: N/A;  . IR FLUORO GUIDE CV LINE RIGHT  11/06/2020  . IR US GUIDE VASC ACCESS RIGHT  11/06/2020  . RIGHT/LEFT HEART CATH AND CORONARY ANGIOGRAPHY N/A 04/27/2019   Procedure: RIGHT/LEFT HEART CATH AND CORONARY ANGIOGRAPHY;  Surgeon: Jolaine Artist, MD;  Location: [place] CV LAB;  Service: Cardiovascular;  Laterality: N/A;  . TEE WITHOUT CARDIOVERSION N/A 06/02/2019   Procedure: TRANSESOPHAGEAL ECHOCARDIOGRAM (TEE);  Surgeon: Jerline Pain, MD;  Location: [facility] ENDOSCOPY;  Service: Cardiovascular;  Laterality: N/A;    Current Medications: Current Meds  Medication Sig  . acetaminophen (TYLENOL) 325 MG tablet Take 1-2 tablets (325-650 mg total) by mouth every 4 (four) hours as needed for mild pain.  Marland Kitchen amiodarone (PACERONE) 200 MG tablet Take one pill twice a day till 02/25. Starting on 02/26 decrease to one pill daily.  . calcium carbonate (TUMS - DOSED IN MG ELEMENTAL CALCIUM) 500 MG chewable tablet Chew 1 tablet (200 mg of elemental calcium total) by mouth 2 (two) times daily as needed for  indigestion or heartburn.  . lactobacillus acidophilus & bulgar (LACTINEX) chewable tablet Chew 1 tablet by mouth 3 (three) times daily with meals.  . megestrol (MEGACE) 40 MG tablet Take 1 tablet (40 mg total) by mouth daily.  . midodrine (PROAMATINE) 10 MG tablet Take 1 tablet (10 mg total) by mouth 3 (three) times daily.  . multivitamin (RENA-VIT) TABS tablet Take 1 tablet by mouth at bedtime.  . Nutritional Supplements (,FEEDING SUPPLEMENT, PROSOURCE PLUS) liquid Take 30 mLs by mouth 2 (two) times daily between meals.  . triamcinolone (KENALOG) 0.025 % ointment Apply 1 application topically 2 (two)  times daily. To areas of rash/itching--apply thin layer.  . [DISCONTINUED] docusate (COLACE) 50 MG/5ML liquid Take 10 mLs (100 mg total) by mouth 2 (two) times daily.  . [DISCONTINUED] sucralfate (CARAFATE) 1 GM/10ML suspension Take 1 g by mouth 4 (four) times daily -  with meals and at bedtime.     Allergies:   Povidone-iodine, Tape, Bee venom, and Covid-19 mrna vacc (moderna)   Social History   Socioeconomic History  . Marital status: Married    Spouse name: Not on file  . Number of children: Not on file  . Years of education: Not on file  . Highest education level: Not on file  Occupational History  . Not on file  Tobacco Use  . Smoking status: Never Smoker  . Smokeless tobacco: Never Used  Vaping Use  . Vaping Use: Never used  Substance and Sexual Activity  . Alcohol use: Yes    Alcohol/week: 7.0 standard drinks    Types: 7 Glasses of wine per week  . Drug use: Never  . Sexual activity: Not on file  Other Topics Concern  . Not on file  Social History Narrative  . Not on file   Social Determinants of Health   Financial Resource Strain: Not on file  Food Insecurity: Not on file  Transportation Needs: Not on file  Physical Activity: Not on file  Stress: Not on file  Social Connections: Not on file     Family History: The patient's family history includes Melanoma in his daughter.  ROS:   Please see the history of present illness.     All other systems reviewed and are negative.  EKGs/Labs/Other Studies Reviewed:    The following studies were reviewed today: Echo 10/16/2020- IMPRESSIONS    1. Left ventricular ejection fraction, by estimation, is 55 to 60%. The  left ventricle has normal function. The left ventricle has no regional  wall motion abnormalities. Left ventricular diastolic function could not  be evaluated.  2. Right ventricular systolic function is normal. The right ventricular  size is normal. There is normal pulmonary artery systolic  pressure. The  estimated right ventricular systolic pressure is 83.1 mmHg.  3. Left atrial size was mildly dilated.  4. Right atrial size was mildly dilated.  5. The mitral valve is normal in structure. No evidence of mitral valve  regurgitation. No evidence of mitral stenosis.  6. The aortic valve is normal in structure. Aortic valve regurgitation is  not visualized. No aortic stenosis is present.  7. The inferior vena cava is normal in size with greater than 50%  respiratory variability, suggesting right atrial pressure of 3 mmHg.   EKG:  EKG is ordered today.  The ekg ordered today demonstrates NSR, RBBB,LAFB  Recent Labs: 10/19/2020: B Natriuretic Peptide 339.3 11/10/2020: Magnesium 2.1 11/11/2020: ALT 7 12/07/2020: BUN 49; Creatinine, Ser 5.98; Hemoglobin 9.6; Platelets PLATELET CLUMPS NOTED  ON SMEAR, UNABLE TO ESTIMATE; Potassium 3.5; Sodium 138  Recent Lipid Panel    Component Value Date/Time   CHOL 124 10/17/2020 0443   TRIG 110 10/17/2020 0443   HDL 38 (L) 10/17/2020 0443   CHOLHDL 3.3 10/17/2020 0443   VLDL 22 10/17/2020 0443   LDLCALC 64 10/17/2020 0443    Physical Exam:    VS:  BP (!) 92/6   Pulse 99   Ht 5\' 9"  (1.753 m)   Wt 163 lb (73.9 kg)   SpO2 98%   BMI 24.07 kg/m     Wt Readings from Last 3 Encounters:  12/20/20 163 lb (73.9 kg)  12/07/20 171 lb 15.3 oz (78 kg)  11/10/20 198 lb 10.2 oz (90.1 kg)     GEN: Cachectic caucasian male, in wheelchair,  in no acute distress HEENT: Normal NECK: No JVD CARDIAC: RRR, no murmurs, rubs, gallops RESPIRATORY:  Clear to auscultation without rales, wheezing or rhonchi -external catheter in place ABDOMEN: Soft, non-tender, non-distended MUSCULOSKELETAL:  No edema; No deformity  SKIN: Warm and dry NEUROLOGIC:  Alert and oriented x 3 PSYCHIATRIC:  Normal affect   ASSESSMENT:    COVID- Jan 3009- complicated by pancreatitis, respiratory failure, renal failure, GI bleeding requiring multiple transfusions, and  PAF.   PAF (paroxysmal atrial fibrillation) ([place]) OP TEE CV Aug 2020- holding NSR today on Amiodarone  Not Anticoagulated Eliquis 5 mg BID has been stopped secondary to GI bleeding  ESRD- Now on HD T-ThSat  Hypotension- On ProAmatine  NICM (nonischemic cardiomyopathy) ([place]) Presumably secondary to AF/flutter.  EF improved from 20% to 45% after TEE CV. No significant CAD at cath July 2020. EF 55-60% by echo Jan 2022  History of melanoma Stable followed at [place]:    No change in Rx- would not start NOAC now.  Keep f/u with Dr Stanford Breed in May as scheduled.    Medication Adjustments/Labs and Tests Ordered: Current medicines are reviewed at length with the patient today.  Concerns regarding medicines are outlined above.  No orders of the defined types were placed in this encounter.  No orders of the defined types were placed in this encounter.   There are no Patient Instructions on file for this visit.   Angelena Form, PA-C  12/20/2020 3:39 PM    [facility]

## 2020-12-21 ENCOUNTER — Encounter: Payer: Self-pay | Admitting: Registered Nurse

## 2020-12-21 DIAGNOSIS — I5022 Chronic systolic (congestive) heart failure: Secondary | ICD-10-CM | POA: Diagnosis not present

## 2020-12-21 DIAGNOSIS — N2581 Secondary hyperparathyroidism of renal origin: Secondary | ICD-10-CM | POA: Diagnosis not present

## 2020-12-21 DIAGNOSIS — N179 Acute kidney failure, unspecified: Secondary | ICD-10-CM | POA: Diagnosis not present

## 2020-12-21 DIAGNOSIS — J690 Pneumonitis due to inhalation of food and vomit: Secondary | ICD-10-CM | POA: Diagnosis not present

## 2020-12-21 DIAGNOSIS — N186 End stage renal disease: Secondary | ICD-10-CM | POA: Diagnosis not present

## 2020-12-21 DIAGNOSIS — T8249XD Other complication of vascular dialysis catheter, subsequent encounter: Secondary | ICD-10-CM | POA: Diagnosis not present

## 2020-12-21 DIAGNOSIS — K5901 Slow transit constipation: Secondary | ICD-10-CM | POA: Diagnosis not present

## 2020-12-21 DIAGNOSIS — G9340 Encephalopathy, unspecified: Secondary | ICD-10-CM | POA: Diagnosis not present

## 2020-12-21 DIAGNOSIS — R471 Dysarthria and anarthria: Secondary | ICD-10-CM

## 2020-12-21 DIAGNOSIS — E538 Deficiency of other specified B group vitamins: Secondary | ICD-10-CM

## 2020-12-21 DIAGNOSIS — Z992 Dependence on renal dialysis: Secondary | ICD-10-CM | POA: Diagnosis not present

## 2020-12-21 DIAGNOSIS — I2510 Atherosclerotic heart disease of native coronary artery without angina pectoris: Secondary | ICD-10-CM | POA: Diagnosis not present

## 2020-12-21 DIAGNOSIS — L89313 Pressure ulcer of right buttock, stage 3: Secondary | ICD-10-CM | POA: Diagnosis not present

## 2020-12-21 DIAGNOSIS — D631 Anemia in chronic kidney disease: Secondary | ICD-10-CM | POA: Diagnosis not present

## 2020-12-21 DIAGNOSIS — R1310 Dysphagia, unspecified: Secondary | ICD-10-CM | POA: Diagnosis not present

## 2020-12-21 DIAGNOSIS — I4891 Unspecified atrial fibrillation: Secondary | ICD-10-CM | POA: Diagnosis not present

## 2020-12-21 DIAGNOSIS — Z79818 Long term (current) use of other agents affecting estrogen receptors and estrogen levels: Secondary | ICD-10-CM

## 2020-12-21 DIAGNOSIS — I959 Hypotension, unspecified: Secondary | ICD-10-CM | POA: Diagnosis not present

## 2020-12-22 DIAGNOSIS — I4891 Unspecified atrial fibrillation: Secondary | ICD-10-CM | POA: Diagnosis not present

## 2020-12-22 DIAGNOSIS — N186 End stage renal disease: Secondary | ICD-10-CM | POA: Diagnosis not present

## 2020-12-22 DIAGNOSIS — L89313 Pressure ulcer of right buttock, stage 3: Secondary | ICD-10-CM | POA: Diagnosis not present

## 2020-12-22 DIAGNOSIS — I2510 Atherosclerotic heart disease of native coronary artery without angina pectoris: Secondary | ICD-10-CM | POA: Diagnosis not present

## 2020-12-22 DIAGNOSIS — D631 Anemia in chronic kidney disease: Secondary | ICD-10-CM | POA: Diagnosis not present

## 2020-12-22 DIAGNOSIS — I5022 Chronic systolic (congestive) heart failure: Secondary | ICD-10-CM | POA: Diagnosis not present

## 2020-12-23 DIAGNOSIS — N2581 Secondary hyperparathyroidism of renal origin: Secondary | ICD-10-CM | POA: Diagnosis not present

## 2020-12-23 DIAGNOSIS — Z992 Dependence on renal dialysis: Secondary | ICD-10-CM | POA: Diagnosis not present

## 2020-12-23 DIAGNOSIS — T8249XD Other complication of vascular dialysis catheter, subsequent encounter: Secondary | ICD-10-CM | POA: Diagnosis not present

## 2020-12-23 DIAGNOSIS — D631 Anemia in chronic kidney disease: Secondary | ICD-10-CM | POA: Diagnosis not present

## 2020-12-23 DIAGNOSIS — N186 End stage renal disease: Secondary | ICD-10-CM | POA: Diagnosis not present

## 2020-12-25 DIAGNOSIS — D631 Anemia in chronic kidney disease: Secondary | ICD-10-CM | POA: Diagnosis not present

## 2020-12-25 DIAGNOSIS — I5022 Chronic systolic (congestive) heart failure: Secondary | ICD-10-CM | POA: Diagnosis not present

## 2020-12-25 DIAGNOSIS — L89313 Pressure ulcer of right buttock, stage 3: Secondary | ICD-10-CM | POA: Diagnosis not present

## 2020-12-25 DIAGNOSIS — N186 End stage renal disease: Secondary | ICD-10-CM | POA: Diagnosis not present

## 2020-12-25 DIAGNOSIS — I4891 Unspecified atrial fibrillation: Secondary | ICD-10-CM | POA: Diagnosis not present

## 2020-12-25 DIAGNOSIS — I2510 Atherosclerotic heart disease of native coronary artery without angina pectoris: Secondary | ICD-10-CM | POA: Diagnosis not present

## 2020-12-26 ENCOUNTER — Telehealth: Payer: Self-pay | Admitting: *Deleted

## 2020-12-26 DIAGNOSIS — D631 Anemia in chronic kidney disease: Secondary | ICD-10-CM | POA: Diagnosis not present

## 2020-12-26 DIAGNOSIS — N2581 Secondary hyperparathyroidism of renal origin: Secondary | ICD-10-CM | POA: Diagnosis not present

## 2020-12-26 DIAGNOSIS — Z992 Dependence on renal dialysis: Secondary | ICD-10-CM | POA: Diagnosis not present

## 2020-12-26 DIAGNOSIS — N186 End stage renal disease: Secondary | ICD-10-CM | POA: Diagnosis not present

## 2020-12-26 DIAGNOSIS — T8249XD Other complication of vascular dialysis catheter, subsequent encounter: Secondary | ICD-10-CM | POA: Diagnosis not present

## 2020-12-26 NOTE — Telephone Encounter (Signed)
Sharyn Lull OT from [place] called to report that Mr Belluomini wife has requested that they limit their visits to one time per week. Therefore, a change to the POC should be 1wk6.  Approval given.

## 2020-12-27 DIAGNOSIS — I953 Hypotension of hemodialysis: Secondary | ICD-10-CM | POA: Diagnosis not present

## 2020-12-27 DIAGNOSIS — L989 Disorder of the skin and subcutaneous tissue, unspecified: Secondary | ICD-10-CM | POA: Diagnosis not present

## 2020-12-27 DIAGNOSIS — D692 Other nonthrombocytopenic purpura: Secondary | ICD-10-CM | POA: Diagnosis not present

## 2020-12-27 DIAGNOSIS — I483 Typical atrial flutter: Secondary | ICD-10-CM | POA: Diagnosis not present

## 2020-12-27 DIAGNOSIS — I5022 Chronic systolic (congestive) heart failure: Secondary | ICD-10-CM | POA: Diagnosis not present

## 2020-12-27 DIAGNOSIS — J309 Allergic rhinitis, unspecified: Secondary | ICD-10-CM | POA: Diagnosis not present

## 2020-12-27 DIAGNOSIS — L89313 Pressure ulcer of right buttock, stage 3: Secondary | ICD-10-CM | POA: Diagnosis not present

## 2020-12-27 DIAGNOSIS — N186 End stage renal disease: Secondary | ICD-10-CM | POA: Diagnosis not present

## 2020-12-27 DIAGNOSIS — I2510 Atherosclerotic heart disease of native coronary artery without angina pectoris: Secondary | ICD-10-CM | POA: Diagnosis not present

## 2020-12-27 DIAGNOSIS — I4891 Unspecified atrial fibrillation: Secondary | ICD-10-CM | POA: Diagnosis not present

## 2020-12-27 DIAGNOSIS — L89153 Pressure ulcer of sacral region, stage 3: Secondary | ICD-10-CM | POA: Diagnosis not present

## 2020-12-27 DIAGNOSIS — R059 Cough, unspecified: Secondary | ICD-10-CM | POA: Diagnosis not present

## 2020-12-27 DIAGNOSIS — D631 Anemia in chronic kidney disease: Secondary | ICD-10-CM | POA: Diagnosis not present

## 2020-12-28 DIAGNOSIS — N186 End stage renal disease: Secondary | ICD-10-CM | POA: Diagnosis not present

## 2020-12-28 DIAGNOSIS — Z992 Dependence on renal dialysis: Secondary | ICD-10-CM | POA: Diagnosis not present

## 2020-12-28 DIAGNOSIS — T8249XD Other complication of vascular dialysis catheter, subsequent encounter: Secondary | ICD-10-CM | POA: Diagnosis not present

## 2020-12-28 DIAGNOSIS — D631 Anemia in chronic kidney disease: Secondary | ICD-10-CM | POA: Diagnosis not present

## 2020-12-28 DIAGNOSIS — N2581 Secondary hyperparathyroidism of renal origin: Secondary | ICD-10-CM | POA: Diagnosis not present

## 2020-12-29 ENCOUNTER — Other Ambulatory Visit: Payer: Self-pay | Admitting: Physical Medicine and Rehabilitation

## 2020-12-29 DIAGNOSIS — L89313 Pressure ulcer of right buttock, stage 3: Secondary | ICD-10-CM | POA: Diagnosis not present

## 2020-12-29 DIAGNOSIS — I4891 Unspecified atrial fibrillation: Secondary | ICD-10-CM | POA: Diagnosis not present

## 2020-12-29 DIAGNOSIS — I2510 Atherosclerotic heart disease of native coronary artery without angina pectoris: Secondary | ICD-10-CM | POA: Diagnosis not present

## 2020-12-29 DIAGNOSIS — I5022 Chronic systolic (congestive) heart failure: Secondary | ICD-10-CM | POA: Diagnosis not present

## 2020-12-29 DIAGNOSIS — N186 End stage renal disease: Secondary | ICD-10-CM | POA: Diagnosis not present

## 2020-12-29 DIAGNOSIS — D631 Anemia in chronic kidney disease: Secondary | ICD-10-CM | POA: Diagnosis not present

## 2020-12-30 DIAGNOSIS — Z992 Dependence on renal dialysis: Secondary | ICD-10-CM | POA: Diagnosis not present

## 2020-12-30 DIAGNOSIS — D631 Anemia in chronic kidney disease: Secondary | ICD-10-CM | POA: Diagnosis not present

## 2020-12-30 DIAGNOSIS — N186 End stage renal disease: Secondary | ICD-10-CM | POA: Diagnosis not present

## 2020-12-30 DIAGNOSIS — N2581 Secondary hyperparathyroidism of renal origin: Secondary | ICD-10-CM | POA: Diagnosis not present

## 2020-12-30 DIAGNOSIS — T8249XD Other complication of vascular dialysis catheter, subsequent encounter: Secondary | ICD-10-CM | POA: Diagnosis not present

## 2021-01-01 DIAGNOSIS — I2510 Atherosclerotic heart disease of native coronary artery without angina pectoris: Secondary | ICD-10-CM | POA: Diagnosis not present

## 2021-01-01 DIAGNOSIS — N186 End stage renal disease: Secondary | ICD-10-CM | POA: Diagnosis not present

## 2021-01-01 DIAGNOSIS — I5022 Chronic systolic (congestive) heart failure: Secondary | ICD-10-CM | POA: Diagnosis not present

## 2021-01-01 DIAGNOSIS — L89313 Pressure ulcer of right buttock, stage 3: Secondary | ICD-10-CM | POA: Diagnosis not present

## 2021-01-01 DIAGNOSIS — I4891 Unspecified atrial fibrillation: Secondary | ICD-10-CM | POA: Diagnosis not present

## 2021-01-01 DIAGNOSIS — D631 Anemia in chronic kidney disease: Secondary | ICD-10-CM | POA: Diagnosis not present

## 2021-01-02 ENCOUNTER — Ambulatory Visit: Payer: BLUE CROSS/BLUE SHIELD | Admitting: Cardiology

## 2021-01-02 DIAGNOSIS — N186 End stage renal disease: Secondary | ICD-10-CM | POA: Diagnosis not present

## 2021-01-02 DIAGNOSIS — Z992 Dependence on renal dialysis: Secondary | ICD-10-CM | POA: Diagnosis not present

## 2021-01-02 DIAGNOSIS — N2581 Secondary hyperparathyroidism of renal origin: Secondary | ICD-10-CM | POA: Diagnosis not present

## 2021-01-02 DIAGNOSIS — T8249XD Other complication of vascular dialysis catheter, subsequent encounter: Secondary | ICD-10-CM | POA: Diagnosis not present

## 2021-01-02 DIAGNOSIS — D631 Anemia in chronic kidney disease: Secondary | ICD-10-CM | POA: Diagnosis not present

## 2021-01-03 DIAGNOSIS — I5022 Chronic systolic (congestive) heart failure: Secondary | ICD-10-CM | POA: Diagnosis not present

## 2021-01-03 DIAGNOSIS — L89313 Pressure ulcer of right buttock, stage 3: Secondary | ICD-10-CM | POA: Diagnosis not present

## 2021-01-03 DIAGNOSIS — I4891 Unspecified atrial fibrillation: Secondary | ICD-10-CM | POA: Diagnosis not present

## 2021-01-03 DIAGNOSIS — N186 End stage renal disease: Secondary | ICD-10-CM | POA: Diagnosis not present

## 2021-01-03 DIAGNOSIS — D631 Anemia in chronic kidney disease: Secondary | ICD-10-CM | POA: Diagnosis not present

## 2021-01-03 DIAGNOSIS — I2510 Atherosclerotic heart disease of native coronary artery without angina pectoris: Secondary | ICD-10-CM | POA: Diagnosis not present

## 2021-01-04 DIAGNOSIS — N2581 Secondary hyperparathyroidism of renal origin: Secondary | ICD-10-CM | POA: Diagnosis not present

## 2021-01-04 DIAGNOSIS — T8249XD Other complication of vascular dialysis catheter, subsequent encounter: Secondary | ICD-10-CM | POA: Diagnosis not present

## 2021-01-04 DIAGNOSIS — N186 End stage renal disease: Secondary | ICD-10-CM | POA: Diagnosis not present

## 2021-01-04 DIAGNOSIS — D631 Anemia in chronic kidney disease: Secondary | ICD-10-CM | POA: Diagnosis not present

## 2021-01-04 DIAGNOSIS — Z992 Dependence on renal dialysis: Secondary | ICD-10-CM | POA: Diagnosis not present

## 2021-01-05 DIAGNOSIS — I2510 Atherosclerotic heart disease of native coronary artery without angina pectoris: Secondary | ICD-10-CM | POA: Diagnosis not present

## 2021-01-05 DIAGNOSIS — L89313 Pressure ulcer of right buttock, stage 3: Secondary | ICD-10-CM | POA: Diagnosis not present

## 2021-01-05 DIAGNOSIS — N186 End stage renal disease: Secondary | ICD-10-CM | POA: Diagnosis not present

## 2021-01-05 DIAGNOSIS — I5022 Chronic systolic (congestive) heart failure: Secondary | ICD-10-CM | POA: Diagnosis not present

## 2021-01-05 DIAGNOSIS — D631 Anemia in chronic kidney disease: Secondary | ICD-10-CM | POA: Diagnosis not present

## 2021-01-05 DIAGNOSIS — I4891 Unspecified atrial fibrillation: Secondary | ICD-10-CM | POA: Diagnosis not present

## 2021-01-06 DIAGNOSIS — D631 Anemia in chronic kidney disease: Secondary | ICD-10-CM | POA: Diagnosis not present

## 2021-01-06 DIAGNOSIS — T8249XD Other complication of vascular dialysis catheter, subsequent encounter: Secondary | ICD-10-CM | POA: Diagnosis not present

## 2021-01-06 DIAGNOSIS — N186 End stage renal disease: Secondary | ICD-10-CM | POA: Diagnosis not present

## 2021-01-06 DIAGNOSIS — Z992 Dependence on renal dialysis: Secondary | ICD-10-CM | POA: Diagnosis not present

## 2021-01-06 DIAGNOSIS — N2581 Secondary hyperparathyroidism of renal origin: Secondary | ICD-10-CM | POA: Diagnosis not present

## 2021-01-07 DIAGNOSIS — E538 Deficiency of other specified B group vitamins: Secondary | ICD-10-CM | POA: Diagnosis not present

## 2021-01-07 DIAGNOSIS — K5901 Slow transit constipation: Secondary | ICD-10-CM | POA: Diagnosis not present

## 2021-01-07 DIAGNOSIS — I959 Hypotension, unspecified: Secondary | ICD-10-CM | POA: Diagnosis not present

## 2021-01-07 DIAGNOSIS — R1310 Dysphagia, unspecified: Secondary | ICD-10-CM | POA: Diagnosis not present

## 2021-01-07 DIAGNOSIS — J690 Pneumonitis due to inhalation of food and vomit: Secondary | ICD-10-CM | POA: Diagnosis not present

## 2021-01-07 DIAGNOSIS — D631 Anemia in chronic kidney disease: Secondary | ICD-10-CM | POA: Diagnosis not present

## 2021-01-07 DIAGNOSIS — G9340 Encephalopathy, unspecified: Secondary | ICD-10-CM | POA: Diagnosis not present

## 2021-01-07 DIAGNOSIS — I4891 Unspecified atrial fibrillation: Secondary | ICD-10-CM | POA: Diagnosis not present

## 2021-01-07 DIAGNOSIS — Z992 Dependence on renal dialysis: Secondary | ICD-10-CM | POA: Diagnosis not present

## 2021-01-07 DIAGNOSIS — N186 End stage renal disease: Secondary | ICD-10-CM | POA: Diagnosis not present

## 2021-01-07 DIAGNOSIS — Z9181 History of falling: Secondary | ICD-10-CM | POA: Diagnosis not present

## 2021-01-07 DIAGNOSIS — R471 Dysarthria and anarthria: Secondary | ICD-10-CM | POA: Diagnosis not present

## 2021-01-07 DIAGNOSIS — Z85820 Personal history of malignant melanoma of skin: Secondary | ICD-10-CM | POA: Diagnosis not present

## 2021-01-07 DIAGNOSIS — Z79818 Long term (current) use of other agents affecting estrogen receptors and estrogen levels: Secondary | ICD-10-CM | POA: Diagnosis not present

## 2021-01-07 DIAGNOSIS — L89313 Pressure ulcer of right buttock, stage 3: Secondary | ICD-10-CM | POA: Diagnosis not present

## 2021-01-07 DIAGNOSIS — I5022 Chronic systolic (congestive) heart failure: Secondary | ICD-10-CM | POA: Diagnosis not present

## 2021-01-07 DIAGNOSIS — I2510 Atherosclerotic heart disease of native coronary artery without angina pectoris: Secondary | ICD-10-CM | POA: Diagnosis not present

## 2021-01-07 DIAGNOSIS — Z8616 Personal history of COVID-19: Secondary | ICD-10-CM | POA: Diagnosis not present

## 2021-01-07 DIAGNOSIS — N179 Acute kidney failure, unspecified: Secondary | ICD-10-CM | POA: Diagnosis not present

## 2021-01-08 DIAGNOSIS — N186 End stage renal disease: Secondary | ICD-10-CM | POA: Diagnosis not present

## 2021-01-08 DIAGNOSIS — I5022 Chronic systolic (congestive) heart failure: Secondary | ICD-10-CM | POA: Diagnosis not present

## 2021-01-08 DIAGNOSIS — I4891 Unspecified atrial fibrillation: Secondary | ICD-10-CM | POA: Diagnosis not present

## 2021-01-08 DIAGNOSIS — L89313 Pressure ulcer of right buttock, stage 3: Secondary | ICD-10-CM | POA: Diagnosis not present

## 2021-01-08 DIAGNOSIS — I2510 Atherosclerotic heart disease of native coronary artery without angina pectoris: Secondary | ICD-10-CM | POA: Diagnosis not present

## 2021-01-08 DIAGNOSIS — D631 Anemia in chronic kidney disease: Secondary | ICD-10-CM | POA: Diagnosis not present

## 2021-01-09 DIAGNOSIS — D631 Anemia in chronic kidney disease: Secondary | ICD-10-CM | POA: Diagnosis not present

## 2021-01-09 DIAGNOSIS — Z992 Dependence on renal dialysis: Secondary | ICD-10-CM | POA: Diagnosis not present

## 2021-01-09 DIAGNOSIS — N2581 Secondary hyperparathyroidism of renal origin: Secondary | ICD-10-CM | POA: Diagnosis not present

## 2021-01-09 DIAGNOSIS — N186 End stage renal disease: Secondary | ICD-10-CM | POA: Diagnosis not present

## 2021-01-09 DIAGNOSIS — T8249XD Other complication of vascular dialysis catheter, subsequent encounter: Secondary | ICD-10-CM | POA: Diagnosis not present

## 2021-01-10 DIAGNOSIS — N186 End stage renal disease: Secondary | ICD-10-CM | POA: Diagnosis not present

## 2021-01-10 DIAGNOSIS — I4891 Unspecified atrial fibrillation: Secondary | ICD-10-CM | POA: Diagnosis not present

## 2021-01-10 DIAGNOSIS — I5022 Chronic systolic (congestive) heart failure: Secondary | ICD-10-CM | POA: Diagnosis not present

## 2021-01-10 DIAGNOSIS — L89313 Pressure ulcer of right buttock, stage 3: Secondary | ICD-10-CM | POA: Diagnosis not present

## 2021-01-10 DIAGNOSIS — D631 Anemia in chronic kidney disease: Secondary | ICD-10-CM | POA: Diagnosis not present

## 2021-01-10 DIAGNOSIS — I2510 Atherosclerotic heart disease of native coronary artery without angina pectoris: Secondary | ICD-10-CM | POA: Diagnosis not present

## 2021-01-11 DIAGNOSIS — Z992 Dependence on renal dialysis: Secondary | ICD-10-CM | POA: Diagnosis not present

## 2021-01-11 DIAGNOSIS — N2581 Secondary hyperparathyroidism of renal origin: Secondary | ICD-10-CM | POA: Diagnosis not present

## 2021-01-11 DIAGNOSIS — T8249XD Other complication of vascular dialysis catheter, subsequent encounter: Secondary | ICD-10-CM | POA: Diagnosis not present

## 2021-01-11 DIAGNOSIS — D631 Anemia in chronic kidney disease: Secondary | ICD-10-CM | POA: Diagnosis not present

## 2021-01-11 DIAGNOSIS — N186 End stage renal disease: Secondary | ICD-10-CM | POA: Diagnosis not present

## 2021-01-12 ENCOUNTER — Encounter (HOSPITAL_BASED_OUTPATIENT_CLINIC_OR_DEPARTMENT_OTHER): Payer: Medicare Other | Attending: Internal Medicine | Admitting: Internal Medicine

## 2021-01-12 ENCOUNTER — Other Ambulatory Visit: Payer: Self-pay

## 2021-01-12 DIAGNOSIS — I129 Hypertensive chronic kidney disease with stage 1 through stage 4 chronic kidney disease, or unspecified chronic kidney disease: Secondary | ICD-10-CM | POA: Diagnosis not present

## 2021-01-12 DIAGNOSIS — L89153 Pressure ulcer of sacral region, stage 3: Secondary | ICD-10-CM | POA: Diagnosis not present

## 2021-01-12 DIAGNOSIS — E43 Unspecified severe protein-calorie malnutrition: Secondary | ICD-10-CM | POA: Diagnosis not present

## 2021-01-12 DIAGNOSIS — I5022 Chronic systolic (congestive) heart failure: Secondary | ICD-10-CM | POA: Diagnosis not present

## 2021-01-12 DIAGNOSIS — I428 Other cardiomyopathies: Secondary | ICD-10-CM | POA: Diagnosis not present

## 2021-01-12 DIAGNOSIS — I2510 Atherosclerotic heart disease of native coronary artery without angina pectoris: Secondary | ICD-10-CM | POA: Diagnosis not present

## 2021-01-12 DIAGNOSIS — D631 Anemia in chronic kidney disease: Secondary | ICD-10-CM | POA: Diagnosis not present

## 2021-01-12 DIAGNOSIS — D509 Iron deficiency anemia, unspecified: Secondary | ICD-10-CM | POA: Diagnosis not present

## 2021-01-12 DIAGNOSIS — Z992 Dependence on renal dialysis: Secondary | ICD-10-CM | POA: Diagnosis not present

## 2021-01-12 DIAGNOSIS — N2581 Secondary hyperparathyroidism of renal origin: Secondary | ICD-10-CM | POA: Diagnosis not present

## 2021-01-12 DIAGNOSIS — N186 End stage renal disease: Secondary | ICD-10-CM | POA: Insufficient documentation

## 2021-01-12 DIAGNOSIS — Z8616 Personal history of COVID-19: Secondary | ICD-10-CM | POA: Diagnosis not present

## 2021-01-12 DIAGNOSIS — Z85820 Personal history of malignant melanoma of skin: Secondary | ICD-10-CM | POA: Insufficient documentation

## 2021-01-12 DIAGNOSIS — I4891 Unspecified atrial fibrillation: Secondary | ICD-10-CM | POA: Insufficient documentation

## 2021-01-12 DIAGNOSIS — L89313 Pressure ulcer of right buttock, stage 3: Secondary | ICD-10-CM | POA: Diagnosis not present

## 2021-01-12 DIAGNOSIS — K8591 Acute pancreatitis with uninfected necrosis, unspecified: Secondary | ICD-10-CM | POA: Insufficient documentation

## 2021-01-12 NOTE — Progress Notes (Addendum)
DEWIGHT, CATINO ([id number]) Visit Report for 01/12/2021 Allergy List Details Patient Name: Date of Service: [place] Breeding 01/12/2021 1:15 PM Medical Record Number: [id number] Patient Account Number: [id number] Date of Birth/Sex: Treating RN: 04-23-1931 (87 y.o. Male) Rhae Hammock Primary Care Neziah Vogelgesang: [name] Other Clinician: Referring Venisha Boehning: Treating Cyani Kallstrom/Extender: Venita Lick T Weeks in Treatment: 0 Allergies Active Allergies bee sting kit latex Allergy Notes Electronic Signature(s) Signed: 01/12/2021 5:01:26 PM By: Rhae Hammock RN Entered By: Rhae Hammock on 01/12/2021 13:24:16 -------------------------------------------------------------------------------- Arrival Information Details Patient Name: Date of Service: [name] 01/12/2021 1:15 PM Medical Record Number: [id number] Patient Account Number: [id number] Date of Birth/Sex: Treating RN: 1930/12/14 (87 y.o. Male) Rhae Hammock Primary Care Etoile Looman: [name] Other Clinician: Referring Jammi Morrissette: Treating Laiyla Slagel/Extender: Evelena Peat in Treatment: 0 Visit Information Patient Arrived: Wheel Chair Arrival Time: 13:21 Accompanied By: wife Transfer Assistance: Manual Patient Identification Verified: Yes Secondary Verification Process Completed: Yes Patient Requires Transmission-Based Precautions: No Patient Has Alerts: No Electronic Signature(s) Signed: 01/12/2021 5:01:26 PM By: Rhae Hammock RN Entered By: Rhae Hammock on 01/12/2021 13:21:14 -------------------------------------------------------------------------------- Clinic Level of Care Assessment Details Patient Name: Date of Service: DA Clemencia Course 01/12/2021 1:15 PM Medical Record Number: [id number] Patient Account Number: [id number] Date of Birth/Sex: Treating RN: 03/18/1931 (87 y.o. Male) Lorrin Jackson Primary Care Vernis Eid: [name] Other Clinician: Referring Shaundrea Carrigg: Treating Samaj Wessells/Extender: Venita Lick T Weeks in Treatment: 0 Clinic Level of Care Assessment Items TOOL 2 Quantity Score X- 1 0 Use when only an EandM is performed on the INITIAL visit ASSESSMENTS - Nursing Assessment / Reassessment X- 1 20 General Physical Exam (combine w/ comprehensive assessment (listed just below) when performed on new pt. evals) X- 1 25 Comprehensive Assessment (HX, ROS, Risk Assessments, Wounds Hx, etc.) ASSESSMENTS - Wound and Skin A ssessment / Reassessment '[]'  - 0 Simple Wound Assessment / Reassessment - one wound X- 1 5 Complex Wound Assessment / Reassessment - multiple wounds '[]'  - 0 Dermatologic / Skin Assessment (not related to wound area) ASSESSMENTS - Ostomy and/or Continence Assessment and Care '[]'  - 0 Incontinence Assessment and Management '[]'  - 0 Ostomy Care Assessment and Management (repouching, etc.) PROCESS - Coordination of Care '[]'  - 0 Simple Patient / Family Education for ongoing care X- 1 20 Complex (extensive) Patient / Family Education for ongoing care X- 1 10 Staff obtains Programmer, systems, Records, T Results / Process Orders est X- 1 10 Staff telephones HHA, Nursing Homes / Clarify orders / etc '[]'  - 0 Routine Transfer to another Facility (non-emergent condition) '[]'  - 0 Routine Hospital Admission (non-emergent condition) X- 1 15 New Admissions / Biomedical engineer / Ordering NPWT Apligraf, etc. , '[]'  - 0 Emergency Hospital Admission (emergent condition) '[]'  - 0 Simple Discharge Coordination '[]'  - 0 Complex (extensive) Discharge Coordination PROCESS - Special Needs '[]'  - 0 Pediatric / Minor Patient Management '[]'  - 0 Isolation Patient Management '[]'  - 0 Hearing / Language / Visual special needs '[]'  - 0 Assessment of Community assistance (transportation, D/C planning, etc.) '[]'  - 0 Additional assistance / Altered mentation X- 1 15 Support Surface(s) Assessment  (bed, cushion, seat, etc.) INTERVENTIONS - Wound Cleansing / Measurement X- 1 5 Wound Imaging (photographs - any number of wounds) '[]'  - 0 Wound Tracing (instead of photographs) '[]'  - 0 Simple Wound Measurement - one wound X- 1 5 Complex Wound Measurement - multiple wounds '[]'  -  0 Simple Wound Cleansing - one wound X- 1 5 Complex Wound Cleansing - multiple wounds INTERVENTIONS - Wound Dressings '[]'  - 0 Small Wound Dressing one or multiple wounds X- 1 15 Medium Wound Dressing one or multiple wounds '[]'  - 0 Large Wound Dressing one or multiple wounds '[]'  - 0 Application of Medications - injection INTERVENTIONS - Miscellaneous '[]'  - 0 External ear exam '[]'  - 0 Specimen Collection (cultures, biopsies, blood, body fluids, etc.) '[]'  - 0 Specimen(s) / Culture(s) sent or taken to Lab for analysis '[]'  - 0 Patient Transfer (multiple staff / Harrel Lemon Lift / Similar devices) '[]'  - 0 Simple Staple / Suture removal (25 or less) '[]'  - 0 Complex Staple / Suture removal (26 or more) '[]'  - 0 Hypo / Hyperglycemic Management (close monitor of Blood Glucose) '[]'  - 0 Ankle / Brachial Index (ABI) - do not check if billed separately Has the patient been seen at the hospital within the last three years: Yes Total Score: 150 Level Of Care: New/Established - Level 4 Electronic Signature(s) Signed: 01/12/2021 5:18:29 PM By: Lorrin Jackson Entered By: Lorrin Jackson on 01/12/2021 14:32:09 -------------------------------------------------------------------------------- Encounter Discharge Information Details Patient Name: Date of Service: [name] 01/12/2021 1:15 PM Medical Record Number: [id number] Patient Account Number: [id number] Date of Birth/Sex: Treating RN: 28-Aug-1931 (87 y.o. Male) Deon Pilling Primary Care Aylana Hirschfeld: [name] Other Clinician: Referring Chares Slaymaker: Treating Kenyette Gundy/Extender: Jacqlyn Larsen Weeks in Treatment: 0 Encounter Discharge Information  Items Discharge Condition: Stable Ambulatory Status: Wheelchair Discharge Destination: Home Transportation: Private Auto Accompanied By: caregiver and wife Schedule Follow-up Appointment: Yes Clinical Summary of Care: Electronic Signature(s) Signed: 01/12/2021 5:39:41 PM By: Deon Pilling Entered By: Deon Pilling on 01/12/2021 17:13:00 -------------------------------------------------------------------------------- Lower Extremity Assessment Details Patient Name: Date of Service: [place] Breeding 01/12/2021 1:15 PM Medical Record Number: [id number] Patient Account Number: [id number] Date of Birth/Sex: Treating RN: 1931/09/12 (87 y.o. Male) Rhae Hammock Primary Care Elizeth Weinrich: [name] Other Clinician: Referring Finnbar Cedillos: Treating Maila Dukes/Extender: Rudean Hitt, Christiane Ha T Weeks in Treatment: 0 Electronic Signature(s) Signed: 01/12/2021 5:01:26 PM By: Rhae Hammock RN Entered By: Rhae Hammock on 01/12/2021 13:33:17 -------------------------------------------------------------------------------- Multi Wound Chart Details Patient Name: Date of Service: [name] 01/12/2021 1:15 PM Medical Record Number: [id number] Patient Account Number: [id number] Date of Birth/Sex: Treating RN: Nov 22, 1930 (87 y.o. Male) Lorrin Jackson Primary Care Danniel Grenz: [name] Other Clinician: Referring Zaela Graley: Treating Rayneisha Bouza/Extender: Venita Lick T Weeks in Treatment: 0 Photos: [1:No Photos Sacrum] [N/A:N/A N/A] Wound Location: [1:Gradually Appeared] [N/A:N/A] Wounding Event: [1:Pressure Ulcer] [N/A:N/A] Primary Etiology: [1:Anemia, Arrhythmia, Hypotension, EndN/A] Comorbid History: [1:Stage Renal Disease 10/14/2020] [N/A:N/A] Date Acquired: [1:0] [N/A:N/A] Weeks of Treatment: [1:Open] [N/A:N/A] Wound Status: [1:2x1.6x1.3] [N/A:N/A] Measurements L x W x D (cm) [1:2.513] [N/A:N/A] A (cm) : rea [1:3.267] [N/A:N/A] Volume (cm)  : [1:0.00%] [N/A:N/A] % Reduction in A rea: [1:0.00%] [N/A:N/A] % Reduction in Volume: [1:12] Starting Position 1 (o'clock): [1:8] Ending Position 1 (o'clock): [1:1.2] Maximum Distance 1 (cm): [1:Yes] [N/A:N/A] Undermining: [1:Category/Stage III] [N/A:N/A] Classification: [1:Medium] [N/A:N/A] Exudate A mount: [1:Serosanguineous] [N/A:N/A] Exudate Type: [1:red, brown] [N/A:N/A] Exudate Color: [1:Distinct, outline attached] [N/A:N/A] Wound Margin: [1:Large (67-100%)] [N/A:N/A] Granulation A mount: [1:Red, Pink] [N/A:N/A] Granulation Quality: [1:None Present (0%)] [N/A:N/A] Necrotic A mount: [1:Fascia: No] [N/A:N/A] Exposed Structures: [1:Fat Layer (Subcutaneous Tissue): No Tendon: No Muscle: No Joint: No Bone: No Small (1-33%)] [N/A:N/A] Treatment Notes Electronic Signature(s) Signed: 01/12/2021 5:01:47 PM By: Linton Ham MD  Signed: 01/12/2021 5:18:29 PM By: Lorrin Jackson Entered By: Linton Ham on 01/12/2021 14:49:42 -------------------------------------------------------------------------------- Multi-Disciplinary Care Plan Details Patient Name: Date of Service: [name] 01/12/2021 1:15 PM Medical Record Number: [id number] Patient Account Number: [id number] Date of Birth/Sex: Treating RN: 07/20/1931 (87 y.o. Male) Lorrin Jackson Primary Care Sharyl Panchal: Other Clinician: [name] Referring Laray Corbit: Treating Holmes Hays/Extender: Jacqlyn Larsen Weeks in Treatment: 0 Active Inactive Orientation to the Wound Care Program Nursing Diagnoses: Knowledge deficit related to the wound healing center program Goals: Patient/caregiver will verbalize understanding of the [place] Program Date Initiated: 01/12/2021 Target Resolution Date: 02/09/2021 Goal Status: Active Interventions: Provide education on orientation to the wound center Notes: Pressure Nursing Diagnoses: Knowledge deficit related to management of pressures  ulcers Goals: Patient will remain free from development of additional pressure ulcers Date Initiated: 01/12/2021 Target Resolution Date: 02/16/2021 Goal Status: Active Interventions: Assess: immobility, friction, shearing, incontinence upon admission and as needed Assess offloading mechanisms upon admission and as needed Provide education on pressure ulcers Notes: Wound/Skin Impairment Nursing Diagnoses: Impaired tissue integrity Goals: Patient/caregiver will verbalize understanding of skin care regimen Date Initiated: 01/12/2021 Target Resolution Date: 02/09/2021 Goal Status: Active Ulcer/skin breakdown will have a volume reduction of 30% by week 4 Date Initiated: 01/12/2021 Target Resolution Date: 02/09/2021 Goal Status: Active Interventions: Assess patient/caregiver ability to obtain necessary supplies Assess patient/caregiver ability to perform ulcer/skin care regimen upon admission and as needed Assess ulceration(s) every visit Provide education on ulcer and skin care Treatment Activities: Skin care regimen initiated : 01/12/2021 Topical wound management initiated : 01/12/2021 Notes: Electronic Signature(s) Signed: 01/12/2021 1:05:54 PM By: Lorrin Jackson Entered By: Lorrin Jackson on 01/12/2021 13:05:53 -------------------------------------------------------------------------------- Pain Assessment Details Patient Name: Date of Service: [name] 01/12/2021 1:15 PM Medical Record Number: [id number] Patient Account Number: [id number] Date of Birth/Sex: Treating RN: June 23, 1931 (87 y.o. Male) Rhae Hammock Primary Care Docia Klar: [name] Other Clinician: Referring Victoriana Aziz: Treating Ada Woodbury/Extender: Venita Lick T Weeks in Treatment: 0 Active Problems Location of Pain Severity and Description of Pain Patient Has Paino No Site Locations Pain Management and Medication Current Pain Management: Electronic Signature(s) Signed: 01/12/2021  5:01:26 PM By: Rhae Hammock RN Entered By: Rhae Hammock on 01/12/2021 13:33:41 -------------------------------------------------------------------------------- Patient/Caregiver Education Details Patient Name: Date of Service: [place] Breeding 4/1/2022andnbsp1:15 PM Medical Record Number: [id number] Patient Account Number: [id number] Date of Birth/Gender: Treating RN: 04-09-1931 (87 y.o. Male) Lorrin Jackson Primary Care Physician: [name] Other Clinician: Referring Physician: Treating Physician/Extender: Evelena Peat in Treatment: 0 Education Assessment Education Provided To: Patient Education Topics Provided Nutrition: Handouts: Nutrition Methods: Explain/Verbal, Printed Responses: State content correctly Pressure: Methods: Explain/Verbal, Printed Responses: State content correctly [place]: o Handouts: [place] o Methods: Explain/Verbal, Printed Responses: State content correctly Wound/Skin Impairment: Methods: Explain/Verbal, Printed Responses: State content correctly Electronic Signature(s) Signed: 01/12/2021 5:18:29 PM By: Lorrin Jackson Entered By: Lorrin Jackson on 01/12/2021 14:14:17 -------------------------------------------------------------------------------- Wound Assessment Details Patient Name: Date of Service: [name] 01/12/2021 1:15 PM Medical Record Number: [id number] Patient Account Number: [id number] Date of Birth/Sex: Treating RN: 1930/12/04 (87 y.o. Male) Rhae Hammock Primary Care Neven Fina: [name] Other Clinician: Referring Coy Rochford: Treating Yuritzy Zehring/Extender: Venita Lick T Weeks in Treatment: 0 Wound Status Wound Number: 1 Primary Etiology: Pressure Ulcer Wound Location: Sacrum Wound Status: Open Wounding Event: Gradually Appeared Comorbid  Anemia, Arrhythmia, Hypotension, End Stage Renal History:  Disease Date Acquired: 10/14/2020 Weeks Of Treatment: 0 Clustered Wound: No Photos Wound Measurements Length: (cm) 2 Width: (cm) 1.6 Depth: (cm) 1.3 Area: (cm) 2.513 Volume: (cm) 3.267 % Reduction in Area: 0% % Reduction in Volume: 0% Epithelialization: Small (1-33%) Tunneling: No Undermining: Yes Starting Position (o'clock): 12 Ending Position (o'clock): 8 Maximum Distance: (cm) 1.2 Wound Description Classification: Category/Stage III Wound Margin: Distinct, outline attached Exudate Amount: Medium Exudate Type: Serosanguineous Exudate Color: red, brown Foul Odor After Cleansing: No Slough/Fibrino No Wound Bed Granulation Amount: Large (67-100%) Exposed Structure Granulation Quality: Red, Pink Fascia Exposed: No Necrotic Amount: None Present (0%) Fat Layer (Subcutaneous Tissue) Exposed: No Tendon Exposed: No Muscle Exposed: No Joint Exposed: No Bone Exposed: No Treatment Notes Wound #1 (Sacrum) Cleanser Soap and Water Discharge Instruction: May shower and wash wound with dial antibacterial soap and water prior to dressing change. Wound Cleanser Discharge Instruction: Cleanse the wound with wound cleanser prior to applying a clean dressing using gauze sponges, not tissue or cotton balls. Peri-Wound Care Topical Primary Dressing Promogran Prisma Matrix, 4.34 (sq in) (silver collagen) Discharge Instruction: Moisten with saline or hydrogel, tuck up into undermining Secondary Dressing Woven Gauze Sponge, Non-Sterile 4x4 in Discharge Instruction: Apply saline moistened gauze after Prisma ComfortFoam Border, 4x4 in (silicone border) Discharge Instruction: Apply over primary dressing as directed. Secured With Compression Wrap Compression Stockings Add-Ons Notes educated patient, wife, and caregiver how to apply dressing. Electronic Signature(s) Signed: 01/12/2021 5:01:26 PM By: Rhae Hammock RN Signed: 01/15/2021 10:23:55 AM By: [name] Entered By:  [name] on 01/12/2021 16:43:25

## 2021-01-12 NOTE — Progress Notes (Signed)
LARK, RUNK ([id number]) Visit Report for 01/12/2021 Chief Complaint Document Details Patient Name: Date of Service: DA Clemencia Course 01/12/2021 1:15 PM Medical Record Number: [id number] Patient Account Number: [id number] Date of Birth/Sex: Treating RN: 02-25-1931 (87 y.o. Male) Lorrin Jackson Primary Care Provider: [name] Other Clinician: Referring Provider: Treating Provider/Extender: Jacqlyn Larsen Weeks in Treatment: 0 Information Obtained from: Patient Chief Complaint 01/12/2021; patient is here for review of sacral pressure ulcer Electronic Signature(s) Signed: 01/12/2021 5:01:47 PM By: Linton Ham MD Entered By: Linton Ham on 01/12/2021 14:49:57 -------------------------------------------------------------------------------- HPI Details Patient Name: Date of Service: [name] 01/12/2021 1:15 PM Medical Record Number: [id number] Patient Account Number: [id number] Date of Birth/Sex: Treating RN: 11-02-1930 (87 y.o. Male) Lorrin Jackson Primary Care Provider: [name] Other Clinician: Referring Provider: Treating Provider/Extender: Jacqlyn Larsen Weeks in Treatment: 0 History of Present Illness HPI Description: ADMISSION 01/12/2021 This is an 87 year old man who comes into the clinic with his wife for review of a stage III pressure ulcer on the lower sacrum. This is actually an improvement. Historically he had a prolonged hospitalization at [facility] from 11/10/2020 through 12/07/2020 presenting with a combination of Covid as well as severe acute pancreatitis. 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 He had a GI bleed with hematochezia requiring transfusions. When he left the hospital his albumin was 2.1. His wife is able to show our staff pictures of improvement in the wound they have been using wet-to-dry dressings. They are also meticulously offloading him as best they can although  they report his intake is still marginal While he was in hospital he went into acute renal failure. I am not really sure of his baseline creatinine however he ended up requiring dialysis and he is continued on dialysis Tuesday Thursday and Saturday. They are really trying I think to help him offload this area. It would appear that he tolerates dialysis very poorly when he gets home he is exhausted really cannot eat. Past medical history includes atrial fibrillation, nonischemic ischemic cardiomyopathy, history of melanoma, hiatal hernia. He is on a dysphagia 3 diet Electronic Signature(s) Signed: 01/12/2021 5:01:47 PM By: Linton Ham MD Entered By: Linton Ham on 01/12/2021 14:53:02 -------------------------------------------------------------------------------- Physical Exam Details Patient Name: Date of Service: [name] 01/12/2021 1:15 PM Medical Record Number: [id number] Patient Account Number: [id number] Date of Birth/Sex: Treating RN: 12/10/1930 (87 y.o. Male) Lorrin Jackson Primary Care Provider: [name] Other Clinician: Referring Provider: Treating Provider/Extender: Venita Lick T Weeks in Treatment: 0 Constitutional Sitting or standing Blood Pressure is within target range for patient.. Pulse regular and within target range for patient.Marland Kitchen Respirations regular, non-labored and within target range.. Temperature is normal and within the target range for the patient.Marland Kitchen Appears in no distress. Patient appears chronically ill. Respiratory work of breathing is normal. Gastrointestinal (GI) No acute abdominal findings however he does appear to have an epigastric mass that is nonpulsatile. This was tender. No liver or spleen enlargement. Psychiatric Patient appears depressed today.. Notes Wound exam; the area in question is on his lower sacrum. Really healthy looking granulation tissue. Superiorly there is an area of undermining but even  that area appears to have healthy granulation tissue. There is no evidence of surrounding infection. No palpable crepitus. Overall the wound surface really looks quite healthy Electronic Signature(s) Signed: 01/12/2021 5:01:47 PM By: Linton Ham MD Entered By: Linton Ham on  01/12/2021 14:55:43 -------------------------------------------------------------------------------- Physician Orders Details Patient Name: Date of Service: [place] Breeding 01/12/2021 1:15 PM Medical Record Number: [id number] Patient Account Number: [id number] Date of Birth/Sex: Treating RN: May 28, 1931 (87 y.o. Male) Lorrin Jackson Primary Care Provider: [name] Other Clinician: Referring Provider: Treating Provider/Extender: Jacqlyn Larsen Weeks in Treatment: 0 Verbal / Phone Orders: No Diagnosis Coding Follow-up Appointments Return Appointment in 2 weeks. Off-Loading Turn and reposition every 2 hours Additional Orders / Instructions Follow Nutritious Diet - 100-120g of protein-Continue [facility] New wound care orders this week; continue Home Health for wound care. May utilize formulary equivalent dressing for wound treatment orders unless otherwise specified. - See dressing orders below Other Home Health Orders/Instructions: - [facility]:Denise Wound Treatment Wound #1 - Sacrum Cleanser: Soap and Water (Home Health) Other:Q 2 days/15 Days Discharge Instructions: May shower and wash wound with dial antibacterial soap and water prior to dressing change. Cleanser: Wound Cleanser (Home Health) Other:Q 2 days/15 Days Discharge Instructions: Cleanse the wound with wound cleanser prior to applying a clean dressing using gauze sponges, not tissue or cotton balls. Prim Dressing: Promogran Prisma Matrix, 4.34 (sq in) (silver collagen) (Home Health) Other:Q 2 days/15 Days ary Discharge Instructions: Moisten with saline or hydrogel, tuck up into undermining Secondary  Dressing: Woven Gauze Sponge, Non-Sterile 4x4 in (Home Health) Other:Q 2 days/15 Days Discharge Instructions: Apply saline moistened gauze after Prisma Secondary Dressing: ComfortFoam Border, 4x4 in (silicone border) (Home Health) Other:Q 2 days/15 Days Discharge Instructions: Apply over primary dressing as directed. Electronic Signature(s) Signed: 01/12/2021 5:01:47 PM By: Linton Ham MD Signed: 01/12/2021 5:18:29 PM By: Lorrin Jackson Entered By: Lorrin Jackson on 01/12/2021 14:27:57 -------------------------------------------------------------------------------- Problem List Details Patient Name: Date of Service: [name] 01/12/2021 1:15 PM Medical Record Number: [id number] Patient Account Number: [id number] Date of Birth/Sex: Treating RN: 07-Sep-1931 (87 y.o. Male) Lorrin Jackson Primary Care Provider: [name] Other Clinician: Referring Provider: Treating Provider/Extender: Jacqlyn Larsen Weeks in Treatment: 0 Active Problems ICD-10 Encounter Code Description Active Date MDM Diagnosis L89.153 Pressure ulcer of sacral region, stage 3 01/12/2021 No Yes K85.81 Other acute pancreatitis with uninfected necrosis 01/12/2021 No Yes E43 Unspecified severe protein-calorie malnutrition 01/12/2021 No Yes Inactive Problems Resolved Problems Electronic Signature(s) Signed: 01/12/2021 5:01:47 PM By: Linton Ham MD Entered By: Linton Ham on 01/12/2021 14:37:32 -------------------------------------------------------------------------------- Progress Note Details Patient Name: Date of Service: [name] 01/12/2021 1:15 PM Medical Record Number: [id number] Patient Account Number: [id number] Date of Birth/Sex: Treating RN: Nov 07, 1930 (87 y.o. Male) Lorrin Jackson Primary Care Provider: [name] Other Clinician: Referring Provider: Treating Provider/Extender: Jacqlyn Larsen Weeks in Treatment: 0 Subjective Chief  Complaint Information obtained from Patient 01/12/2021; patient is here for review of sacral pressure ulcer History of Present Illness (HPI) ADMISSION 01/12/2021 This is an 87 year old man who comes into the clinic with his wife for review of a stage III pressure ulcer on the lower sacrum. This is actually an improvement. Historically he had a prolonged hospitalization at [facility] from 11/10/2020 through 12/07/2020 presenting with a combination of Covid as well as severe acute pancreatitis. 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 He had a GI bleed with hematochezia requiring transfusions. When he left the hospital his albumin was 2.1. His wife is able to show our staff pictures of improvement in the wound they have been using wet-to-dry dressings. They are also meticulously offloading  him as best they can although they report his intake is still marginal While he was in hospital he went into acute renal failure. I am not really sure of his baseline creatinine however he ended up requiring dialysis and he is continued on dialysis Tuesday Thursday and Saturday. They are really trying I think to help him offload this area. It would appear that he tolerates dialysis very poorly when he gets home he is exhausted really cannot eat. Past medical history includes atrial fibrillation, nonischemic ischemic cardiomyopathy, history of melanoma, hiatal hernia. He is on a dysphagia 3 diet Patient History Information obtained from Patient. Allergies bee sting kit, latex Family History Unknown History. Social History Never smoker, Marital Status - Married, Alcohol Use - Moderate, Drug Use - No History, Caffeine Use - Never. Medical History Eyes Denies history of Cataracts, Glaucoma, Optic Neuritis Ear/Nose/Mouth/Throat Denies history of Chronic sinus problems/congestion, Middle ear problems Hematologic/Lymphatic Patient has history of Anemia Denies history of Hemophilia, Human  Immunodeficiency Virus, Lymphedema, Sickle Cell Disease Respiratory Denies history of Aspiration, Asthma, Chronic Obstructive Pulmonary Disease (COPD), Pneumothorax, Sleep Apnea, Tuberculosis Cardiovascular Patient has history of Arrhythmia - A fibb, Hypotension - hemodialysis-associated Denies history of Angina, Congestive Heart Failure, Coronary Artery Disease, Deep Vein Thrombosis, Hypertension, Myocardial Infarction, Peripheral Arterial Disease, Peripheral Venous Disease, Phlebitis, Vasculitis Gastrointestinal Denies history of Cirrhosis , Colitis, Crohnoos, Hepatitis A, Hepatitis B, Hepatitis C Endocrine Denies history of Type I Diabetes, Type II Diabetes Genitourinary Patient has history of 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 , Immunological Denies history of Lupus Erythematosus, Raynaudoos, Scleroderma Integumentary (Skin) Denies history of History of Burn Musculoskeletal Denies history of Gout, Rheumatoid Arthritis, Osteoarthritis, Osteomyelitis Neurologic Denies history of Dementia, Neuropathy, Quadriplegia, Paraplegia, Seizure Disorder Oncologic Denies history of Received Chemotherapy, Received Radiation Hospitalization/Surgery History - pancreatitis. Medical A Surgical History Notes nd Cardiovascular ischemic cardiomyopathy Review of Systems (ROS) Constitutional Symptoms (General Health) Denies complaints or symptoms of Fatigue, Fever, Chills, Marked Weight Change. Eyes Denies complaints or symptoms of Dry Eyes, Vision Changes, Glasses / Contacts. Ear/Nose/Mouth/Throat Denies complaints or symptoms of Chronic sinus problems or rhinitis. Respiratory Denies complaints or symptoms of Chronic or frequent coughs, Shortness of Breath. Gastrointestinal Denies complaints or symptoms of Frequent diarrhea, Nausea, Vomiting. Endocrine Denies complaints or symptoms of Heat/cold intolerance. Genitourinary Denies complaints or symptoms of Frequent  urination. Integumentary (Skin) Complains or has symptoms of Wounds - 1. Musculoskeletal Denies complaints or symptoms of Muscle Pain, Muscle Weakness. Neurologic Denies complaints or symptoms of Numbness/parasthesias. Psychiatric Denies complaints or symptoms of Claustrophobia, Suicidal. Objective Constitutional Sitting or standing Blood Pressure is within target range for patient.. Pulse regular and within target range for patient.Marland Kitchen Respirations regular, non-labored and within target range.. Temperature is normal and within the target range for the patient.Marland Kitchen Appears in no distress. Patient appears chronically ill. Respiratory work of breathing is normal. Gastrointestinal (GI) No acute abdominal findings however he does appear to have an epigastric mass that is nonpulsatile. This was tender. No liver or spleen enlargement. Psychiatric Patient appears depressed today.. General Notes: Wound exam; the area in question is on his lower sacrum. Really healthy looking granulation tissue. Superiorly there is an area of undermining but even that area appears to have healthy granulation tissue. There is no evidence of surrounding infection. No palpable crepitus. Overall the wound surface really looks quite healthy Integumentary (Hair, Skin) Wound #1 status is Open. Original cause of wound was Gradually Appeared. The date acquired was: 10/14/2020. The wound is  located on the Sacrum. The wound measures 2cm length x 1.6cm width x 1.3cm depth; 2.513cm^2 area and 3.267cm^3 volume. There is no tunneling noted, however, there is undermining starting at 12:00 and ending at 8:00 with a maximum distance of 1.2cm. There is a medium amount of serosanguineous drainage noted. The wound margin is distinct with the outline attached to the wound base. There is large (67-100%) red, pink granulation within the wound bed. There is no necrotic tissue within the wound bed. Assessment Active Problems ICD-10 Pressure  ulcer of sacral region, stage 3 Other acute pancreatitis with uninfected necrosis Unspecified severe protein-calorie malnutrition Plan Follow-up Appointments: Return Appointment in 2 weeks. Off-Loading: Turn and reposition every 2 hours Additional Orders / Instructions: Follow Nutritious Diet - 100-120g of protein-Continue [facility]: New wound care orders this week; continue Home Health for wound care. May utilize formulary equivalent dressing for wound treatment orders unless otherwise specified. - See dressing orders below Other Home Health Orders/Instructions: - [facility]ise WOUND #1: - Sacrum Wound Laterality: Cleanser: Soap and Water ([facility]) Other:Q 2 days/15 Days Discharge Instructions: May shower and wash wound with dial antibacterial soap and water prior to dressing change. Cleanser: Wound Cleanser (Home Health) Other:Q 2 days/15 Days Discharge Instructions: Cleanse the wound with wound cleanser prior to applying a clean dressing using gauze sponges, not tissue or cotton balls. Prim Dressing: Promogran Prisma Matrix, 4.34 (sq in) (silver collagen) (Home Health) Other:Q 2 days/15 Days ary Discharge Instructions: Moisten with saline or hydrogel, tuck up into undermining Secondary Dressing: Woven Gauze Sponge, Non-Sterile 4x4 in (Home Health) Other:Q 2 days/15 Days Discharge Instructions: Apply saline moistened gauze after Prisma Secondary Dressing: ComfortFoam Border, 4x4 in (silicone border) (Home Health) Other:Q 2 days/15 Days Discharge Instructions: Apply over primary dressing as directed. 1. I put moistened silver collagen with backing moist gauze and border foam which can be left in change every 2 2. We talked about offloading this area and it really sounds between his wife and the in-home care they have that they are being meticulous about this. They even send the caregiver for dialysis to help offload this. 3. The big issue here appears to be fairly severe  protein calorie malnutrition. He is not eating well. He feels nauseated. 4. I wonder what I am feeling in his epigastrium. He did have appearance of developing pseudocyst during his last CT scan in late January. I gave him suggestions about how to increase and augment his caloric and protein intake. He is already on a protein supplement. Electronic Signature(s) Signed: 01/12/2021 5:01:47 PM By: Linton Ham MD Entered By: Linton Ham on 01/12/2021 14:57:48 -------------------------------------------------------------------------------- HxROS Details Patient Name: Date of Service: [name] 01/12/2021 1:15 PM Medical Record Number: [id number] Patient Account Number: [id number] Date of Birth/Sex: Treating RN: October 15, 1930 (87 y.o. Male) Rhae Hammock Primary Care Provider: [name] Other Clinician: Referring Provider: Treating Provider/Extender: Jacqlyn Larsen Weeks in Treatment: 0 Information Obtained From Patient Constitutional Symptoms (General Health) Complaints and Symptoms: Negative for: Fatigue; Fever; Chills; Marked Weight Change Eyes Complaints and Symptoms: Negative for: Dry Eyes; Vision Changes; Glasses / Contacts Medical History: Negative for: Cataracts; Glaucoma; Optic Neuritis Ear/Nose/Mouth/Throat Complaints and Symptoms: Negative for: Chronic sinus problems or rhinitis Medical History: Negative for: Chronic sinus problems/congestion; Middle ear problems Respiratory Complaints and Symptoms: Negative for: Chronic or frequent coughs; Shortness of Breath Medical History: Negative for: Aspiration; Asthma; Chronic Obstructive Pulmonary Disease (COPD); Pneumothorax; Sleep Apnea; Tuberculosis Gastrointestinal Complaints and  Symptoms: Negative for: Frequent diarrhea; Nausea; Vomiting Medical History: Negative for: Cirrhosis ; Colitis; Crohns; Hepatitis A; Hepatitis B; Hepatitis C Endocrine Complaints and Symptoms: Negative  for: Heat/cold intolerance Medical History: Negative for: Type I Diabetes; Type II Diabetes Genitourinary Complaints and Symptoms: Negative for: Frequent urination Medical History: Positive for: 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 , Integumentary (Skin) Complaints and Symptoms: Positive for: Wounds - 1 Medical History: Negative for: History of Burn Musculoskeletal Complaints and Symptoms: Negative for: Muscle Pain; Muscle Weakness Medical History: Negative for: Gout; Rheumatoid Arthritis; Osteoarthritis; Osteomyelitis Neurologic Complaints and Symptoms: Negative for: Numbness/parasthesias Medical History: Negative for: Dementia; Neuropathy; Quadriplegia; Paraplegia; Seizure Disorder Psychiatric Complaints and Symptoms: Negative for: Claustrophobia; Suicidal Hematologic/Lymphatic Medical History: Positive for: Anemia Negative for: Hemophilia; Human Immunodeficiency Virus; Lymphedema; Sickle Cell Disease Cardiovascular Medical History: Positive for: Arrhythmia - A fibb; Hypotension - hemodialysis-associated Negative for: Angina; Congestive Heart Failure; Coronary Artery Disease; Deep Vein Thrombosis; Hypertension; Myocardial Infarction; Peripheral Arterial Disease; Peripheral Venous Disease; Phlebitis; Vasculitis Past Medical History Notes: ischemic cardiomyopathy Immunological Medical History: Negative for: Lupus Erythematosus; Raynauds; Scleroderma Oncologic Medical History: Negative for: Received Chemotherapy; Received Radiation Immunizations Pneumococcal Vaccine: Received Pneumococcal Vaccination: Yes Implantable Devices Yes Hospitalization / Surgery History Type of Hospitalization/Surgery pancreatitis Family and Social History Unknown History: Yes; Never smoker; Marital Status - Married; Alcohol Use: Moderate; Drug Use: No History; Caffeine Use: Never; Financial Concerns: No; Food, Clothing or Shelter Needs: No; Support System Lacking: No;  Transportation Concerns: No Electronic Signature(s) Signed: 01/12/2021 5:01:26 PM By: Rhae Hammock RN Signed: 01/12/2021 5:01:47 PM By: Linton Ham MD Entered By: Rhae Hammock on 01/12/2021 13:40:19 -------------------------------------------------------------------------------- SuperBill Details Patient Name: Date of Service: [name] 01/12/2021 Medical Record Number: [id number] Patient Account Number: [id number] Date of Birth/Sex: Treating RN: [birth year] (87 y.o. Male) Lorrin Jackson Primary Care Provider: [name] Other Clinician: Referring Provider: Treating Provider/Extender: Venita Lick T Weeks in Treatment: 0 Diagnosis Coding ICD-10 Codes Code Description [phone] Pressure ulcer of sacral region, stage 3 K85.81 Other acute pancreatitis with uninfected necrosis E43 Unspecified severe protein-calorie malnutrition Facility Procedures CPT4 Code: [id number] Description: 99214 - WOUND CARE VISIT-LEV 4 EST PT Modifier: 25 Quantity: 1 Physician Procedures : CPT4 Code Description Modifier [id number] [place] PHYS LEVEL 3 NEW PT ICD-10 Diagnosis Description L89.153 Pressure ulcer of sacral region, stage 3 K85.81 Other acute pancreatitis with uninfected necrosis E43 Unspecified severe protein-calorie malnutrition Quantity: 1 Electronic Signature(s) Signed: 01/12/2021 4:44:58 PM By: Lorrin Jackson Signed: 01/12/2021 5:01:47 PM By: Linton Ham MD Previous Signature: 01/12/2021 2:32:18 PM Version By: Lorrin Jackson Entered By: Lorrin Jackson on 01/12/2021 16:44:57

## 2021-01-12 NOTE — Progress Notes (Signed)
Raymond Hurley ([id number]) Visit Report for 01/12/2021 Abuse/Suicide Risk Screen Details Patient Name: Date of Service: Raymond Hurley 01/12/2021 1:15 PM Medical Record Number: [id number] Patient Account Number: [id number] Date of Birth/Sex: Treating RN: 16-Sep-1931 (87 y.o. Male) Rhae Hammock Primary Care Catalia Massett: [name] Other Clinician: Referring Aviyana Sonntag: Treating Georgette Helmer/Extender: Venita Lick T Weeks in Treatment: 0 Abuse/Suicide Risk Screen Items Answer ABUSE RISK SCREEN: Has anyone close to you tried to hurt or harm you recentlyo No Do you feel uncomfortable with anyone in your familyo No Has anyone forced you do things that you didnt want to doo No Electronic Signature(s) Signed: 01/12/2021 5:01:26 PM By: Rhae Hammock RN Entered By: Rhae Hammock on 01/12/2021 13:26:11 -------------------------------------------------------------------------------- Activities of Daily Living Details Patient Name: Date of Service: Raymond Hurley 01/12/2021 1:15 PM Medical Record Number: [id number] Patient Account Number: [id number] Date of Birth/Sex: Treating RN: 10-22-1930 (87 y.o. Male) Rhae Hammock Primary Care Cybill Uriegas: [name] Other Clinician: Referring Merilynn Haydu: Treating Quentyn Kolbeck/Extender: Venita Lick T Weeks in Treatment: 0 Activities of Daily Living Items Answer Activities of Daily Living (Please select one for each item) Drive Automobile Not Able T Medications ake Need Assistance Use T elephone Need Assistance Care for Appearance Need Assistance Use T oilet Need Assistance Bath / Shower Need Assistance Dress Self Need Assistance Feed Self Need Assistance Walk Need Assistance Get In / [place] Need Assistance Shop for Self Need Assistance Electronic Signature(s) Signed: 01/12/2021 5:01:26 PM By:  Rhae Hammock RN Entered By: Rhae Hammock on 01/12/2021 13:27:48 -------------------------------------------------------------------------------- Education Screening Details Patient Name: Date of Service: Raymond Hurley MPSO N G. 01/12/2021 1:15 PM Medical Record Number: [id number] Patient Account Number: [id number] Date of Birth/Sex: Treating RN: Sep 02, 1931 (87 y.o. Male) Rhae Hammock Primary Care Beuford Garcilazo: [name] Other Clinician: Referring Dorrie Cocuzza: Treating Lizmary Nader/Extender: Evelena Peat in Treatment: 0 Primary Learner Assessed: Patient Learning Preferences/Education Level/Primary Language Learning Preference: Explanation, Demonstration, Communication Board, Printed Material Highest Education Level: College or Above Preferred Language: English Cognitive Barrier Language Barrier: No Translator Needed: No Memory Deficit: No Emotional Barrier: No Cultural/Religious Beliefs Affecting Medical Care: No Physical Barrier Impaired Vision: No Impaired Hearing: No Decreased Hand dexterity: No Knowledge/Comprehension Knowledge Level: High Comprehension Level: High Ability to understand written instructions: High Ability to understand verbal instructions: High Motivation Anxiety Level: Calm Cooperation: Cooperative Education Importance: Denies Need Interest in Health Problems: Asks Questions Perception: Coherent Willingness to Engage in Self-Management High Activities: Readiness to Engage in Self-Management High Activities: Electronic Signature(s) Signed: 01/12/2021 5:01:26 PM By: Rhae Hammock RN Entered By: Rhae Hammock on 01/12/2021 13:31:20 -------------------------------------------------------------------------------- Fall Risk Assessment Details Patient Name: Date of Service: Raymond Hurley MPSO N G. 01/12/2021 1:15 PM Medical Record Number: [id number] Patient Account Number: [id number] Date of Birth/Sex: Treating  RN: 1930-11-10 (87 y.o. Male) Rhae Hammock Primary Care Corinne Goucher: [name] Other Clinician: Referring Kailie Polus: Treating Donovyn Guidice/Extender: Venita Lick T Weeks in Treatment: 0 Fall Risk Assessment Items Have you had 2 or more falls in the last 12 monthso 0 No Have you had any fall that resulted in injury in the last 12 monthso 0 No FALLS RISK SCREEN History of falling - immediate or within 3 months 0 No Secondary diagnosis (Do you have 2 or more medical diagnoseso) 0 No Ambulatory aid None/bed rest/wheelchair/nurse 0 No Crutches/cane/walker 0 No Furniture 0 No Intravenous  therapy Access/Saline/Heparin Lock 0 No Gait/Transferring Normal/ bed rest/ wheelchair 0 No Weak (short steps with or without shuffle, stooped but able to lift head while walking, may seek 0 No support from furniture) Impaired (short steps with shuffle, may have difficulty arising from chair, head down, impaired 0 No balance) Mental Status Oriented to own ability 0 No Electronic Signature(s) Signed: 01/12/2021 5:01:26 PM By: Rhae Hammock RN Entered By: Rhae Hammock on 01/12/2021 13:31:31 -------------------------------------------------------------------------------- Foot Assessment Details Patient Name: Date of Service: Raymond Hurley MPSO N G. 01/12/2021 1:15 PM Medical Record Number: [id number] Patient Account Number: [id number] Date of Birth/Sex: Treating RN: 29-Apr-1931 (87 y.o. Male) Rhae Hammock Primary Care Kieffer Blatz: [name] Other Clinician: Referring Bliss Behnke: Treating Tristine Langi/Extender: Venita Lick T Weeks in Treatment: 0 Foot Assessment Items Site Locations + = Sensation present, - = Sensation absent, C = Callus, U = Ulcer R = Redness, W = Warmth, M = Maceration, PU = Pre-ulcerative lesion F = Fissure, S = Swelling, D = Dryness Assessment Right: Left: Other Deformity: No No Prior Foot Ulcer: No No Prior Amputation: No  No Charcot Joint: No No Ambulatory Status: Gait: Notes no LE wounds; pt. not diabetic Electronic Signature(s) Signed: 01/12/2021 5:01:26 PM By: Rhae Hammock RN Entered By: Rhae Hammock on 01/12/2021 13:33:10 -------------------------------------------------------------------------------- Nutrition Risk Screening Details Patient Name: Date of Service: Raymond Hurley 01/12/2021 1:15 PM Medical Record Number: [id number] Patient Account Number: [id number] Date of Birth/Sex: Treating RN: 08-08-1931 (87 y.o. Male) Rhae Hammock Primary Care Johnda Billiot: [name] Other Clinician: Referring Tylor Courtwright: Treating Rachana Malesky/Extender: Venita Lick T Weeks in Treatment: 0 Height (in): Weight (lbs): Body Mass Index (BMI): Nutrition Risk Screening Items Score Screening NUTRITION RISK SCREEN: I have an illness or condition that made me change the kind and/or amount of food I eat 2 Yes I eat fewer than two meals per day 3 Yes I eat few fruits and vegetables, or milk products 0 No I have three or more drinks of beer, liquor or wine almost every day 0 No I have tooth or mouth problems that make it hard for me to eat 0 No I don't always have enough money to buy the food I need 0 No I eat alone most of the time 0 No I take three or more different prescribed or over-the-counter drugs a day 0 No Without wanting to, I have lost or gained 10 pounds in the last six months 0 No I am not always physically able to shop, cook and/or feed myself 0 No Nutrition Protocols Good Risk Protocol Moderate Risk Protocol 0 Provide education on nutrition High Risk Proctocol Risk Level: Moderate Risk Score: 5 Electronic Signature(s) Signed: 01/12/2021 5:01:26 PM By: Rhae Hammock RN Entered By: Rhae Hammock on 01/12/2021 13:31:46

## 2021-01-13 DIAGNOSIS — Z992 Dependence on renal dialysis: Secondary | ICD-10-CM | POA: Diagnosis not present

## 2021-01-13 DIAGNOSIS — N186 End stage renal disease: Secondary | ICD-10-CM | POA: Diagnosis not present

## 2021-01-13 DIAGNOSIS — D509 Iron deficiency anemia, unspecified: Secondary | ICD-10-CM | POA: Diagnosis not present

## 2021-01-13 DIAGNOSIS — N2581 Secondary hyperparathyroidism of renal origin: Secondary | ICD-10-CM | POA: Diagnosis not present

## 2021-01-13 DIAGNOSIS — D631 Anemia in chronic kidney disease: Secondary | ICD-10-CM | POA: Diagnosis not present

## 2021-01-15 DIAGNOSIS — N186 End stage renal disease: Secondary | ICD-10-CM | POA: Diagnosis not present

## 2021-01-15 DIAGNOSIS — L89313 Pressure ulcer of right buttock, stage 3: Secondary | ICD-10-CM | POA: Diagnosis not present

## 2021-01-15 DIAGNOSIS — I5022 Chronic systolic (congestive) heart failure: Secondary | ICD-10-CM | POA: Diagnosis not present

## 2021-01-15 DIAGNOSIS — I2510 Atherosclerotic heart disease of native coronary artery without angina pectoris: Secondary | ICD-10-CM | POA: Diagnosis not present

## 2021-01-15 DIAGNOSIS — I4891 Unspecified atrial fibrillation: Secondary | ICD-10-CM | POA: Diagnosis not present

## 2021-01-15 DIAGNOSIS — D631 Anemia in chronic kidney disease: Secondary | ICD-10-CM | POA: Diagnosis not present

## 2021-01-16 DIAGNOSIS — D631 Anemia in chronic kidney disease: Secondary | ICD-10-CM | POA: Diagnosis not present

## 2021-01-16 DIAGNOSIS — D509 Iron deficiency anemia, unspecified: Secondary | ICD-10-CM | POA: Diagnosis not present

## 2021-01-16 DIAGNOSIS — Z992 Dependence on renal dialysis: Secondary | ICD-10-CM | POA: Diagnosis not present

## 2021-01-16 DIAGNOSIS — N2581 Secondary hyperparathyroidism of renal origin: Secondary | ICD-10-CM | POA: Diagnosis not present

## 2021-01-16 DIAGNOSIS — N186 End stage renal disease: Secondary | ICD-10-CM | POA: Diagnosis not present

## 2021-01-17 DIAGNOSIS — D631 Anemia in chronic kidney disease: Secondary | ICD-10-CM | POA: Diagnosis not present

## 2021-01-17 DIAGNOSIS — I2510 Atherosclerotic heart disease of native coronary artery without angina pectoris: Secondary | ICD-10-CM | POA: Diagnosis not present

## 2021-01-17 DIAGNOSIS — I4891 Unspecified atrial fibrillation: Secondary | ICD-10-CM | POA: Diagnosis not present

## 2021-01-17 DIAGNOSIS — N186 End stage renal disease: Secondary | ICD-10-CM | POA: Diagnosis not present

## 2021-01-17 DIAGNOSIS — I5022 Chronic systolic (congestive) heart failure: Secondary | ICD-10-CM | POA: Diagnosis not present

## 2021-01-17 DIAGNOSIS — L89313 Pressure ulcer of right buttock, stage 3: Secondary | ICD-10-CM | POA: Diagnosis not present

## 2021-01-18 DIAGNOSIS — Z992 Dependence on renal dialysis: Secondary | ICD-10-CM | POA: Diagnosis not present

## 2021-01-18 DIAGNOSIS — N2581 Secondary hyperparathyroidism of renal origin: Secondary | ICD-10-CM | POA: Diagnosis not present

## 2021-01-18 DIAGNOSIS — D509 Iron deficiency anemia, unspecified: Secondary | ICD-10-CM | POA: Diagnosis not present

## 2021-01-18 DIAGNOSIS — D631 Anemia in chronic kidney disease: Secondary | ICD-10-CM | POA: Diagnosis not present

## 2021-01-18 DIAGNOSIS — N186 End stage renal disease: Secondary | ICD-10-CM | POA: Diagnosis not present

## 2021-01-19 DIAGNOSIS — I2510 Atherosclerotic heart disease of native coronary artery without angina pectoris: Secondary | ICD-10-CM | POA: Diagnosis not present

## 2021-01-19 DIAGNOSIS — I4891 Unspecified atrial fibrillation: Secondary | ICD-10-CM | POA: Diagnosis not present

## 2021-01-19 DIAGNOSIS — T8249XA Other complication of vascular dialysis catheter, initial encounter: Secondary | ICD-10-CM | POA: Diagnosis not present

## 2021-01-19 DIAGNOSIS — Z992 Dependence on renal dialysis: Secondary | ICD-10-CM | POA: Diagnosis not present

## 2021-01-19 DIAGNOSIS — D631 Anemia in chronic kidney disease: Secondary | ICD-10-CM | POA: Diagnosis not present

## 2021-01-19 DIAGNOSIS — I5022 Chronic systolic (congestive) heart failure: Secondary | ICD-10-CM | POA: Diagnosis not present

## 2021-01-19 DIAGNOSIS — L89313 Pressure ulcer of right buttock, stage 3: Secondary | ICD-10-CM | POA: Diagnosis not present

## 2021-01-19 DIAGNOSIS — N186 End stage renal disease: Secondary | ICD-10-CM | POA: Diagnosis not present

## 2021-01-20 DIAGNOSIS — N2581 Secondary hyperparathyroidism of renal origin: Secondary | ICD-10-CM | POA: Diagnosis not present

## 2021-01-20 DIAGNOSIS — D509 Iron deficiency anemia, unspecified: Secondary | ICD-10-CM | POA: Diagnosis not present

## 2021-01-20 DIAGNOSIS — D631 Anemia in chronic kidney disease: Secondary | ICD-10-CM | POA: Diagnosis not present

## 2021-01-20 DIAGNOSIS — Z992 Dependence on renal dialysis: Secondary | ICD-10-CM | POA: Diagnosis not present

## 2021-01-20 DIAGNOSIS — N186 End stage renal disease: Secondary | ICD-10-CM | POA: Diagnosis not present

## 2021-01-22 DIAGNOSIS — D631 Anemia in chronic kidney disease: Secondary | ICD-10-CM | POA: Diagnosis not present

## 2021-01-22 DIAGNOSIS — I5022 Chronic systolic (congestive) heart failure: Secondary | ICD-10-CM | POA: Diagnosis not present

## 2021-01-22 DIAGNOSIS — I2510 Atherosclerotic heart disease of native coronary artery without angina pectoris: Secondary | ICD-10-CM | POA: Diagnosis not present

## 2021-01-22 DIAGNOSIS — I4891 Unspecified atrial fibrillation: Secondary | ICD-10-CM | POA: Diagnosis not present

## 2021-01-22 DIAGNOSIS — L89313 Pressure ulcer of right buttock, stage 3: Secondary | ICD-10-CM | POA: Diagnosis not present

## 2021-01-22 DIAGNOSIS — N186 End stage renal disease: Secondary | ICD-10-CM | POA: Diagnosis not present

## 2021-01-23 DIAGNOSIS — N186 End stage renal disease: Secondary | ICD-10-CM | POA: Diagnosis not present

## 2021-01-23 DIAGNOSIS — D631 Anemia in chronic kidney disease: Secondary | ICD-10-CM | POA: Diagnosis not present

## 2021-01-23 DIAGNOSIS — D509 Iron deficiency anemia, unspecified: Secondary | ICD-10-CM | POA: Diagnosis not present

## 2021-01-23 DIAGNOSIS — Z992 Dependence on renal dialysis: Secondary | ICD-10-CM | POA: Diagnosis not present

## 2021-01-23 DIAGNOSIS — N2581 Secondary hyperparathyroidism of renal origin: Secondary | ICD-10-CM | POA: Diagnosis not present

## 2021-01-24 DIAGNOSIS — C799 Secondary malignant neoplasm of unspecified site: Secondary | ICD-10-CM | POA: Diagnosis not present

## 2021-01-24 DIAGNOSIS — M898X8 Other specified disorders of bone, other site: Secondary | ICD-10-CM | POA: Diagnosis not present

## 2021-01-24 DIAGNOSIS — J9601 Acute respiratory failure with hypoxia: Secondary | ICD-10-CM | POA: Diagnosis not present

## 2021-01-24 DIAGNOSIS — Z7901 Long term (current) use of anticoagulants: Secondary | ICD-10-CM | POA: Diagnosis not present

## 2021-01-24 DIAGNOSIS — N186 End stage renal disease: Secondary | ICD-10-CM | POA: Diagnosis not present

## 2021-01-24 DIAGNOSIS — Z992 Dependence on renal dialysis: Secondary | ICD-10-CM | POA: Diagnosis not present

## 2021-01-24 DIAGNOSIS — E538 Deficiency of other specified B group vitamins: Secondary | ICD-10-CM | POA: Diagnosis not present

## 2021-01-24 DIAGNOSIS — C434 Malignant melanoma of scalp and neck: Secondary | ICD-10-CM | POA: Diagnosis not present

## 2021-01-24 DIAGNOSIS — I2699 Other pulmonary embolism without acute cor pulmonale: Secondary | ICD-10-CM | POA: Diagnosis not present

## 2021-01-24 DIAGNOSIS — C439 Malignant melanoma of skin, unspecified: Secondary | ICD-10-CM | POA: Diagnosis not present

## 2021-01-24 DIAGNOSIS — K859 Acute pancreatitis without necrosis or infection, unspecified: Secondary | ICD-10-CM | POA: Diagnosis not present

## 2021-01-24 DIAGNOSIS — I4891 Unspecified atrial fibrillation: Secondary | ICD-10-CM | POA: Diagnosis not present

## 2021-01-24 DIAGNOSIS — C7931 Secondary malignant neoplasm of brain: Secondary | ICD-10-CM | POA: Diagnosis not present

## 2021-01-24 DIAGNOSIS — C770 Secondary and unspecified malignant neoplasm of lymph nodes of head, face and neck: Secondary | ICD-10-CM | POA: Diagnosis not present

## 2021-01-24 DIAGNOSIS — C7889 Secondary malignant neoplasm of other digestive organs: Secondary | ICD-10-CM | POA: Diagnosis not present

## 2021-01-24 DIAGNOSIS — D696 Thrombocytopenia, unspecified: Secondary | ICD-10-CM | POA: Diagnosis not present

## 2021-01-25 DIAGNOSIS — N186 End stage renal disease: Secondary | ICD-10-CM | POA: Diagnosis not present

## 2021-01-25 DIAGNOSIS — D509 Iron deficiency anemia, unspecified: Secondary | ICD-10-CM | POA: Diagnosis not present

## 2021-01-25 DIAGNOSIS — N2581 Secondary hyperparathyroidism of renal origin: Secondary | ICD-10-CM | POA: Diagnosis not present

## 2021-01-25 DIAGNOSIS — Z992 Dependence on renal dialysis: Secondary | ICD-10-CM | POA: Diagnosis not present

## 2021-01-25 DIAGNOSIS — D631 Anemia in chronic kidney disease: Secondary | ICD-10-CM | POA: Diagnosis not present

## 2021-01-26 ENCOUNTER — Other Ambulatory Visit: Payer: Self-pay

## 2021-01-26 ENCOUNTER — Encounter (HOSPITAL_BASED_OUTPATIENT_CLINIC_OR_DEPARTMENT_OTHER): Payer: Medicare Other | Admitting: Internal Medicine

## 2021-01-26 DIAGNOSIS — Z8616 Personal history of COVID-19: Secondary | ICD-10-CM | POA: Diagnosis not present

## 2021-01-26 DIAGNOSIS — L89153 Pressure ulcer of sacral region, stage 3: Secondary | ICD-10-CM | POA: Diagnosis not present

## 2021-01-26 DIAGNOSIS — I2510 Atherosclerotic heart disease of native coronary artery without angina pectoris: Secondary | ICD-10-CM | POA: Diagnosis not present

## 2021-01-26 DIAGNOSIS — I4891 Unspecified atrial fibrillation: Secondary | ICD-10-CM | POA: Diagnosis not present

## 2021-01-26 DIAGNOSIS — D631 Anemia in chronic kidney disease: Secondary | ICD-10-CM | POA: Diagnosis not present

## 2021-01-26 DIAGNOSIS — E43 Unspecified severe protein-calorie malnutrition: Secondary | ICD-10-CM | POA: Diagnosis not present

## 2021-01-26 DIAGNOSIS — K8591 Acute pancreatitis with uninfected necrosis, unspecified: Secondary | ICD-10-CM | POA: Diagnosis not present

## 2021-01-26 DIAGNOSIS — Z992 Dependence on renal dialysis: Secondary | ICD-10-CM | POA: Diagnosis not present

## 2021-01-26 DIAGNOSIS — L89313 Pressure ulcer of right buttock, stage 3: Secondary | ICD-10-CM | POA: Diagnosis not present

## 2021-01-26 DIAGNOSIS — I5022 Chronic systolic (congestive) heart failure: Secondary | ICD-10-CM | POA: Diagnosis not present

## 2021-01-26 DIAGNOSIS — N186 End stage renal disease: Secondary | ICD-10-CM | POA: Diagnosis not present

## 2021-01-26 NOTE — Progress Notes (Addendum)
DALIN, CALDERA ([id number]) Visit Report for 01/26/2021 Chief Complaint Document Details Patient Name: Date of Service: Raymond Hurley 01/26/2021 2:15 PM Medical Record Number: [id number] Patient Account Number: [id number] Date of Birth/Sex: Treating RN: 11-24-1930 (87 y.o. [name] Primary Care Provider: [name] Other Clinician: Referring Provider: Treating Provider/Extender: Cleatrice Burke Weeks in Treatment: 2 Information Obtained from: Patient Chief Complaint 01/12/2021; patient is here for review of sacral pressure ulcer Electronic Signature(s) Signed: 01/26/2021 3:17:24 PM By: Kalman Shan DO Entered By: Kalman Shan on 01/26/2021 14:59:16 -------------------------------------------------------------------------------- HPI Details Patient Name: Date of Service: Raymond Banks MPSO N G. 01/26/2021 2:15 PM Medical Record Number: [id number] Patient Account Number: [id number] Date of Birth/Sex: Treating RN: 05-28-1931 (87 y.o. [name] Primary Care Provider: [name] Other Clinician: Referring Provider: Treating Provider/Extender: Cleatrice Burke Weeks in Treatment: 2 History of Present Illness HPI Description: ADMISSION 01/12/2021 This is an 87 year old man who comes into the clinic with his wife for review of a stage III pressure ulcer on the lower sacrum. This is actually an improvement. Historically he had a prolonged hospitalization at [facility] from 11/10/2020 through 12/07/2020 presenting with a combination of Covid as well as severe acute pancreatitis. 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 He had a GI bleed with hematochezia requiring transfusions. When he left the hospital his albumin was 2.1. His wife is able to show our staff pictures of improvement in the wound they have been using wet-to-dry dressings. They are also meticulously offloading him as best they can although  they report his intake is still marginal While he was in hospital he went into acute renal failure. I am not really sure of his baseline creatinine however he ended up requiring dialysis and he is continued on dialysis Tuesday Thursday and Saturday. They are really trying I think to help him offload this area. It would appear that he tolerates dialysis very poorly when he gets home he is exhausted really cannot eat. Past medical history includes atrial fibrillation, nonischemic ischemic cardiomyopathy, history of melanoma, hiatal hernia. He is on a dysphagia 3 diet 4/15; patient presents for 2-week follow-up. He reports no issues since last clinic visit. He has been using silver collagen every other day and repositioning onto his sides every 2 hours to relieve pressure to his bottom. Him and his wife are pleased with the wound healing progress so far. Electronic Signature(s) Signed: 01/26/2021 3:17:24 PM By: Kalman Shan DO Entered By: Kalman Shan on 01/26/2021 15:01:14 -------------------------------------------------------------------------------- Physical Exam Details Patient Name: Date of Service: Raymond Banks MPSO N G. 01/26/2021 2:15 PM Medical Record Number: [id number] Patient Account Number: [id number] Date of Birth/Sex: Treating RN: 23-Feb-1931 (86 y.o. [name] Primary Care Provider: [name] Other Clinician: Referring Provider: Treating Provider/Extender: [name] Weeks in Treatment: 2 Constitutional respirations regular, non-labored and within target range for patient.Marland Kitchen Psychiatric pleasant and cooperative. Notes Sacral ulcer: On the lower sacrum there is healthy granulation tissue present. Superiorly there is a small area of undermining however this appears to be filling in well. No signs of infection. No palpable crepitus. Electronic Signature(s) Signed: 01/26/2021 3:17:24 PM By: Kalman Shan DO Entered By: Kalman Shan  on 01/26/2021 15:05:05 -------------------------------------------------------------------------------- Physician Orders Details Patient Name: Date of Service: Raymond Banks MPSO N G. 01/26/2021 2:15 PM Medical Record Number: [id number] Patient Account Number: [id number] Date of Birth/Sex: Treating RN: 03-Feb-1931 (87  y.o. Jerilynn Mages) Lorrin Jackson Primary Care Provider: [name] Other Clinician: Referring Provider: Treating Provider/Extender: Cleatrice Burke Weeks in Treatment: 2 Verbal / Phone Orders: No Diagnosis Coding ICD-10 Coding Code Description L89.153 Pressure ulcer of sacral region, stage 3 K85.81 Other acute pancreatitis with uninfected necrosis E43 Unspecified severe protein-calorie malnutrition Follow-up Appointments Return Appointment in 2 weeks. Off-Loading Turn and reposition every 2 hours Additional Orders / Instructions Follow Nutritious Diet - 100-120g of protein-Continue [facility] New wound care orders this week; continue Home Health for wound care. May utilize formulary equivalent dressing for wound treatment orders unless otherwise specified. - See dressing orders below Other Home Health Orders/Instructions: - [facility]:Denise Wound Treatment Wound #1 - Sacrum Cleanser: Soap and Water (Home Health) Other:Q 2 days/15 Days Discharge Instructions: May shower and wash wound with dial antibacterial soap and water prior to dressing change. Cleanser: Wound Cleanser (Home Health) Other:Q 2 days/15 Days Discharge Instructions: Cleanse the wound with wound cleanser prior to applying a clean dressing using gauze sponges, not tissue or cotton balls. Prim Dressing: Promogran Prisma Matrix, 4.34 (sq in) (silver collagen) (Home Health) Other:Q 2 days/15 Days ary Discharge Instructions: Moisten with saline or hydrogel, tuck up into undermining Secondary Dressing: Woven Gauze Sponge, Non-Sterile 4x4 in (Home Health) Other:Q 2 days/15 Days Discharge  Instructions: Apply saline moistened gauze after Prisma Secondary Dressing: ComfortFoam Border, 4x4 in (silicone border) (Home Health) Other:Q 2 days/15 Days Discharge Instructions: Apply over primary dressing as directed. Electronic Signature(s) Signed: 01/26/2021 3:17:24 PM By: Kalman Shan DO Previous Signature: 01/26/2021 2:11:17 PM Version By: Lorrin Jackson Entered By: Kalman Shan on 01/26/2021 15:05:23 -------------------------------------------------------------------------------- Problem List Details Patient Name: Date of Service: Raymond Banks MPSO N G. 01/26/2021 2:15 PM Medical Record Number: [id number] Patient Account Number: [id number] Date of Birth/Sex: Treating RN: Jul 09, 1931 (87 y.o. [name] Primary Care Provider: [name] Other Clinician: Referring Provider: Treating Provider/Extender: [name] Weeks in Treatment: 2 Active Problems ICD-10 Encounter Code Description Active Date MDM Diagnosis L89.153 Pressure ulcer of sacral region, stage 3 01/12/2021 No Yes K85.81 Other acute pancreatitis with uninfected necrosis 01/12/2021 No Yes E43 Unspecified severe protein-calorie malnutrition 01/12/2021 No Yes Inactive Problems Resolved Problems Electronic Signature(s) Signed: 01/26/2021 3:17:24 PM By: Kalman Shan DO Previous Signature: 01/26/2021 2:10:57 PM Version By: Lorrin Jackson Entered By: Kalman Shan on 01/26/2021 14:59:03 -------------------------------------------------------------------------------- Progress Note Details Patient Name: Date of Service: Raymond Banks MPSO N G. 01/26/2021 2:15 PM Medical Record Number: [id number] Patient Account Number: [id number] Date of Birth/Sex: Treating RN: 1931/05/02 (87 y.o. [name] Primary Care Provider: Other Clinician: [name] Referring Provider: Treating Provider/Extender: Cleatrice Burke Weeks in Treatment: 2 Subjective Chief  Complaint Information obtained from Patient 01/12/2021; patient is here for review of sacral pressure ulcer History of Present Illness (HPI) ADMISSION 01/12/2021 This is an 87 year old man who comes into the clinic with his wife for review of a stage III pressure ulcer on the lower sacrum. This is actually an improvement. Historically he had a prolonged hospitalization at [facility] from 11/10/2020 through 12/07/2020 presenting with a combination of Covid as well as severe acute pancreatitis. 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 He had a GI bleed with hematochezia requiring transfusions. When he left the hospital his albumin was 2.1. His wife is able to show our staff pictures of improvement in the wound they have been using wet-to-dry dressings. They are also meticulously offloading  him as best they can although they report his intake is still marginal While he was in hospital he went into acute renal failure. I am not really sure of his baseline creatinine however he ended up requiring dialysis and he is continued on dialysis Tuesday Thursday and Saturday. They are really trying I think to help him offload this area. It would appear that he tolerates dialysis very poorly when he gets home he is exhausted really cannot eat. Past medical history includes atrial fibrillation, nonischemic ischemic cardiomyopathy, history of melanoma, hiatal hernia. He is on a dysphagia 3 diet 4/15; patient presents for 2-week follow-up. He reports no issues since last clinic visit. He has been using silver collagen every other day and repositioning onto his sides every 2 hours to relieve pressure to his bottom. Him and his wife are pleased with the wound healing progress so far. Patient History Information obtained from Patient. Family History Unknown History. Social History Never smoker, Marital Status - Married, Alcohol Use - Moderate, Drug Use - No History, Caffeine Use - Never. Medical  History Eyes Denies history of Cataracts, Glaucoma, Optic Neuritis Ear/Nose/Mouth/Throat Denies history of Chronic sinus problems/congestion, Middle ear problems Hematologic/Lymphatic Patient has history of Anemia Denies history of Hemophilia, Human Immunodeficiency Virus, Lymphedema, Sickle Cell Disease Respiratory Denies history of Aspiration, Asthma, Chronic Obstructive Pulmonary Disease (COPD), Pneumothorax, Sleep Apnea, Tuberculosis Cardiovascular Patient has history of Arrhythmia - A fibb, Hypotension - hemodialysis-associated Denies history of Angina, Congestive Heart Failure, Coronary Artery Disease, Deep Vein Thrombosis, Hypertension, Myocardial Infarction, Peripheral Arterial Disease, Peripheral Venous Disease, Phlebitis, Vasculitis Gastrointestinal Denies history of Cirrhosis , Colitis, Crohnoos, Hepatitis A, Hepatitis B, Hepatitis C Endocrine Denies history of Type I Diabetes, Type II Diabetes Genitourinary Patient has history of 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 , Immunological Denies history of Lupus Erythematosus, Raynaudoos, Scleroderma Integumentary (Skin) Denies history of History of Burn Musculoskeletal Denies history of Gout, Rheumatoid Arthritis, Osteoarthritis, Osteomyelitis Neurologic Denies history of Dementia, Neuropathy, Quadriplegia, Paraplegia, Seizure Disorder Oncologic Denies history of Received Chemotherapy, Received Radiation Hospitalization/Surgery History - pancreatitis. Medical A Surgical History Notes nd Cardiovascular ischemic cardiomyopathy Objective Constitutional respirations regular, non-labored and within target range for patient.. Vitals Time Taken: 2:09 PM, Temperature: 97.9 F, Pulse: 93 bpm, Respiratory Rate: 17 breaths/min, Blood Pressure: 108/68 mmHg. Psychiatric pleasant and cooperative. General Notes: Sacral ulcer: On the lower sacrum there is healthy granulation tissue present. Superiorly there is a small  area of undermining however this appears to be filling in well. No signs of infection. No palpable crepitus. Integumentary (Hair, Skin) Wound #1 status is Open. Original cause of wound was Gradually Appeared. The date acquired was: 10/14/2020. The wound has been in treatment 2 weeks. The wound is located on the Sacrum. The wound measures 0.6cm length x 0.4cm width x 0.5cm depth; 0.188cm^2 area and 0.094cm^3 volume. There is no tunneling or undermining noted. There is a medium amount of serosanguineous drainage noted. The wound margin is distinct with the outline attached to the wound base. There is large (67-100%) red, pink granulation within the wound bed. There is no necrotic tissue within the wound bed. Assessment Active Problems ICD-10 Pressure ulcer of sacral region, stage 3 Other acute pancreatitis with uninfected necrosis Unspecified severe protein-calorie malnutrition This is the patient's 2-week follow-up. There is a significant decrease in wound size since last clinic visit. He has been doing well using silver collagen every other day and offloading. Wound appears well healing. I recommended he continue the  same course and will see him at follow up in 2 weeks. Plan Follow-up Appointments: Return Appointment in 2 weeks. Off-Loading: Turn and reposition every 2 hours Additional Orders / Instructions: Follow Nutritious Diet - 100-120g of protein-Continue [facility]: New wound care orders this week; continue Home Health for wound care. May utilize formulary equivalent dressing for wound treatment orders unless otherwise specified. - See dressing orders below Other Home Health Orders/Instructions: - [facility]:Denise WOUND #1: - Sacrum Wound Laterality: Cleanser: Soap and Water ([place]) Other:Q 2 days/15 Days Discharge Instructions: May shower and wash wound with dial antibacterial soap and water prior to dressing change. Cleanser: Wound Cleanser (Home Health) Other:Q 2  days/15 Days Discharge Instructions: Cleanse the wound with wound cleanser prior to applying a clean dressing using gauze sponges, not tissue or cotton balls. Prim Dressing: Promogran Prisma Matrix, 4.34 (sq in) (silver collagen) (Home Health) Other:Q 2 days/15 Days ary Discharge Instructions: Moisten with saline or hydrogel, tuck up into undermining Secondary Dressing: Woven Gauze Sponge, Non-Sterile 4x4 in (Home Health) Other:Q 2 days/15 Days Discharge Instructions: Apply saline moistened gauze after Prisma Secondary Dressing: ComfortFoam Border, 4x4 in (silicone border) (Home Health) Other:Q 2 days/15 Days Discharge Instructions: Apply over primary dressing as directed. 1. Silver collagen every other day with dressing changes 2. follow up in 2 weeks 3. continue aggresive offloading Electronic Signature(s) Signed: 01/26/2021 3:17:24 PM By: Kalman Shan DO Entered By: Kalman Shan on 01/26/2021 15:12:46 -------------------------------------------------------------------------------- HxROS Details Patient Name: Date of Service: DA Raymond Lack MPSO N G. 01/26/2021 2:15 PM Medical Record Number: [id number] Patient Account Number: [id number] Date of Birth/Sex: Treating RN: Feb 20, 1931 (87 y.o. [name] Primary Care Provider: [name] Other Clinician: Referring Provider: Treating Provider/Extender: Cleatrice Burke Weeks in Treatment: 2 Information Obtained From Patient Eyes Medical History: Negative for: Cataracts; Glaucoma; Optic Neuritis Ear/Nose/Mouth/Throat Medical History: Negative for: Chronic sinus problems/congestion; Middle ear problems Hematologic/Lymphatic Medical History: Positive for: Anemia Negative for: Hemophilia; Human Immunodeficiency Virus; Lymphedema; Sickle Cell Disease Respiratory Medical History: Negative for: Aspiration; Asthma; Chronic Obstructive Pulmonary Disease (COPD); Pneumothorax; Sleep Apnea;  Tuberculosis Cardiovascular Medical History: Positive for: Arrhythmia - A fibb; Hypotension - hemodialysis-associated Negative for: Angina; Congestive Heart Failure; Coronary Artery Disease; Deep Vein Thrombosis; Hypertension; Myocardial Infarction; Peripheral Arterial Disease; Peripheral Venous Disease; Phlebitis; Vasculitis Past Medical History Notes: ischemic cardiomyopathy Gastrointestinal Medical History: Negative for: Cirrhosis ; Colitis; Crohns; Hepatitis A; Hepatitis B; Hepatitis C Endocrine Medical History: Negative for: Type I Diabetes; Type II Diabetes Genitourinary Medical History: Positive for: 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 , Immunological Medical History: Negative for: Lupus Erythematosus; Raynauds; Scleroderma Integumentary (Skin) Medical History: Negative for: History of Burn Musculoskeletal Medical History: Negative for: Gout; Rheumatoid Arthritis; Osteoarthritis; Osteomyelitis Neurologic Medical History: Negative for: Dementia; Neuropathy; Quadriplegia; Paraplegia; Seizure Disorder Oncologic Medical History: Negative for: Received Chemotherapy; Received Radiation Immunizations Pneumococcal Vaccine: Received Pneumococcal Vaccination: Yes Implantable Devices Yes Hospitalization / Surgery History Type of Hospitalization/Surgery pancreatitis Family and Social History Unknown History: Yes; Never smoker; Marital Status - Married; Alcohol Use: Moderate; Drug Use: No History; Caffeine Use: Never; Financial Concerns: No; Food, Clothing or Shelter Needs: No; Support System Lacking: No; Transportation Concerns: No Electronic Signature(s) Signed: 01/26/2021 3:17:24 PM By: Kalman Shan DO Signed: 01/26/2021 5:21:53 PM By: Lorrin Jackson Entered By: Kalman Shan on 01/26/2021 15:01:22 -------------------------------------------------------------------------------- SuperBill Details Patient Name: Date of Service: Raymond Banks MPSO N G.  01/26/2021 Medical Record Number: [id number] Patient Account Number: [id number] Date  of Birth/Sex: Treating RN: 1930-10-28 (87 y.o. [name] Primary Care Provider: [name] Other Clinician: Referring Provider: Treating Provider/Extender: [name] Weeks in Treatment: 2 Diagnosis Coding ICD-10 Codes Code Description [phone] Pressure ulcer of sacral region, stage 3 K85.81 Other acute pancreatitis with uninfected necrosis E43 Unspecified severe protein-calorie malnutrition Facility Procedures CPT4 Code: [id number] Description: 11155 - WOUND CARE VISIT-LEV 3 EST PT Modifier: Quantity: 1 Electronic Signature(s) Signed: 01/26/2021 3:17:24 PM By: Kalman Shan DO Entered By: Kalman Shan on 01/26/2021 15:16:15

## 2021-01-27 DIAGNOSIS — D509 Iron deficiency anemia, unspecified: Secondary | ICD-10-CM | POA: Diagnosis not present

## 2021-01-27 DIAGNOSIS — N186 End stage renal disease: Secondary | ICD-10-CM | POA: Diagnosis not present

## 2021-01-27 DIAGNOSIS — N2581 Secondary hyperparathyroidism of renal origin: Secondary | ICD-10-CM | POA: Diagnosis not present

## 2021-01-27 DIAGNOSIS — D631 Anemia in chronic kidney disease: Secondary | ICD-10-CM | POA: Diagnosis not present

## 2021-01-27 DIAGNOSIS — Z992 Dependence on renal dialysis: Secondary | ICD-10-CM | POA: Diagnosis not present

## 2021-01-29 DIAGNOSIS — N186 End stage renal disease: Secondary | ICD-10-CM | POA: Diagnosis not present

## 2021-01-29 DIAGNOSIS — D631 Anemia in chronic kidney disease: Secondary | ICD-10-CM | POA: Diagnosis not present

## 2021-01-29 DIAGNOSIS — I2510 Atherosclerotic heart disease of native coronary artery without angina pectoris: Secondary | ICD-10-CM | POA: Diagnosis not present

## 2021-01-29 DIAGNOSIS — I5022 Chronic systolic (congestive) heart failure: Secondary | ICD-10-CM | POA: Diagnosis not present

## 2021-01-29 DIAGNOSIS — L89313 Pressure ulcer of right buttock, stage 3: Secondary | ICD-10-CM | POA: Diagnosis not present

## 2021-01-29 DIAGNOSIS — I4891 Unspecified atrial fibrillation: Secondary | ICD-10-CM | POA: Diagnosis not present

## 2021-01-30 DIAGNOSIS — N186 End stage renal disease: Secondary | ICD-10-CM | POA: Diagnosis not present

## 2021-01-30 DIAGNOSIS — Z992 Dependence on renal dialysis: Secondary | ICD-10-CM | POA: Diagnosis not present

## 2021-01-30 DIAGNOSIS — D631 Anemia in chronic kidney disease: Secondary | ICD-10-CM | POA: Diagnosis not present

## 2021-01-30 DIAGNOSIS — N2581 Secondary hyperparathyroidism of renal origin: Secondary | ICD-10-CM | POA: Diagnosis not present

## 2021-01-30 DIAGNOSIS — D509 Iron deficiency anemia, unspecified: Secondary | ICD-10-CM | POA: Diagnosis not present

## 2021-01-30 NOTE — Progress Notes (Signed)
Raymond Hurley ([id number]) Visit Report for 01/26/2021 Arrival Information Details Patient Name: Date of Service: Raymond Hurley 01/26/2021 2:15 PM Medical Record Number: [id number] Patient Account Number: [id number] Date of Birth/Sex: Treating RN: 04-07-1931 (87 y.o. Raymond Hurley, Raymond Hurley Primary Care Mickie Kozikowski: [name] Other Clinician: Referring Amador Braddy: Treating Klaus Casteneda/Extender: Cleatrice Burke Weeks in Treatment: 2 Visit Information History Since Last Visit Added or deleted any medications: No Patient Arrived: Wheel Chair Any new allergies or adverse reactions: No Arrival Time: 14:06 Had a fall or experienced change in No Accompanied By: wife activities of daily living that may affect Transfer Assistance: None risk of falls: Patient Identification Verified: Yes Signs or symptoms of abuse/neglect since last visito No Secondary Verification Process Completed: Yes Hospitalized since last visit: No Patient Requires Transmission-Based Precautions: No Implantable device outside of the clinic excluding No Patient Has Alerts: No cellular tissue based products placed in the center since last visit: Has Dressing in Place as Prescribed: Yes Pain Present Now: No Electronic Signature(s) Signed: 01/26/2021 5:30:00 PM By: Rhae Hammock RN Entered By: Rhae Hammock on 01/26/2021 14:07:06 -------------------------------------------------------------------------------- Clinic Level of Care Assessment Details Patient Name: Date of Service: Raymond Hurley 01/26/2021 2:15 PM Medical Record Number: [id number] Patient Account Number: [id number] Date of Birth/Sex: Treating RN: 25-Sep-1931 (87 y.o. Raymond Hurley Primary Care Katira Dumais: [name] Other Clinician: Referring Labron Bloodgood: Treating Raymond Hurley/Extender: Cleatrice Burke Weeks in Treatment: 2 Clinic Level of Care Assessment Items TOOL 4 Quantity Score X- 1  0 Use when only an EandM is performed on FOLLOW-UP visit ASSESSMENTS - Nursing Assessment / Reassessment X- 1 10 Reassessment of Co-morbidities (includes updates in patient status) X- 1 5 Reassessment of Adherence to Treatment Plan ASSESSMENTS - Wound and Skin A ssessment / Reassessment X - Simple Wound Assessment / Reassessment - one wound 1 5 []  - 0 Complex Wound Assessment / Reassessment - multiple wounds []  - 0 Dermatologic / Skin Assessment (not related to wound area) ASSESSMENTS - Focused Assessment []  - 0 Circumferential Edema Measurements - multi extremities []  - 0 Nutritional Assessment / Counseling / Intervention []  - 0 Lower Extremity Assessment (monofilament, tuning fork, pulses) []  - 0 Peripheral Arterial Disease Assessment (using hand held doppler) ASSESSMENTS - Ostomy and/or Continence Assessment and Care []  - 0 Incontinence Assessment and Management []  - 0 Ostomy Care Assessment and Management (repouching, etc.) PROCESS - Coordination of Care []  - 0 Simple Patient / Family Education for ongoing care X- 1 20 Complex (extensive) Patient / Family Education for ongoing care X- 1 10 Staff obtains Programmer, systems, Records, T Results / Process Orders est X- 1 10 Staff telephones HHA, Nursing Homes / Clarify orders / etc []  - 0 Routine Transfer to another Facility (non-emergent condition) []  - 0 Routine Hospital Admission (non-emergent condition) []  - 0 New Admissions / Biomedical engineer / Ordering NPWT Apligraf, etc. , []  - 0 Emergency Hospital Admission (emergent condition) []  - 0 Simple Discharge Coordination []  - 0 Complex (extensive) Discharge Coordination PROCESS - Special Needs []  - 0 Pediatric / Minor Patient Management []  - 0 Isolation Patient Management []  - 0 Hearing / Language / Visual special needs []  - 0 Assessment of Community assistance (transportation, D/C planning, etc.) []  - 0 Additional assistance / Altered mentation []  -  0 Support Surface(s) Assessment (bed, cushion, seat, etc.) INTERVENTIONS - Wound Cleansing / Measurement X - Simple Wound Cleansing - one wound 1 5 []  -  0 Complex Wound Cleansing - multiple wounds X- 1 5 Wound Imaging (photographs - any number of wounds) []  - 0 Wound Tracing (instead of photographs) X- 1 5 Simple Wound Measurement - one wound []  - 0 Complex Wound Measurement - multiple wounds INTERVENTIONS - Wound Dressings X - Small Wound Dressing one or multiple wounds 1 10 []  - 0 Medium Wound Dressing one or multiple wounds []  - 0 Large Wound Dressing one or multiple wounds X- 1 5 Application of Medications - topical []  - 0 Application of Medications - injection INTERVENTIONS - Miscellaneous []  - 0 External ear exam []  - 0 Specimen Collection (cultures, biopsies, blood, body fluids, etc.) []  - 0 Specimen(s) / Culture(s) sent or taken to Lab for analysis []  - 0 Patient Transfer (multiple staff / Civil Service fast streamer / Similar devices) []  - 0 Simple Staple / Suture removal (25 or less) []  - 0 Complex Staple / Suture removal (26 or more) []  - 0 Hypo / Hyperglycemic Management (close monitor of Blood Glucose) []  - 0 Ankle / Brachial Index (ABI) - do not check if billed separately X- 1 5 Vital Signs Has the patient been seen at the hospital within the last three years: Yes Total Score: 95 Level Of Care: New/Established - Level 3 Electronic Signature(s) Signed: 01/26/2021 5:21:53 PM By: Lorrin Jackson Entered By: Lorrin Jackson on 01/26/2021 14:36:58 -------------------------------------------------------------------------------- Encounter Discharge Information Details Patient Name: Date of Service: Raymond Hurley MPSO N G. 01/26/2021 2:15 PM Medical Record Number: [id number] Patient Account Number: [id number] Date of Birth/Sex: Treating RN: 06-Mar-1931 (87 y.o. Erie Noe Primary Care Cerrone Debold: [name] Other Clinician: Referring Jancarlos Thrun: Treating  Siearra Amberg/Extender: Eugenio Hurley in Treatment: 2 Encounter Discharge Information Items Discharge Condition: Stable Ambulatory Status: Wheelchair Discharge Destination: Home Transportation: Private Auto Accompanied By: wife Schedule Follow-up Appointment: Yes Clinical Summary of Care: Patient Declined Electronic Signature(s) Signed: 01/26/2021 5:30:00 PM By: Rhae Hammock RN Entered By: Rhae Hammock on 01/26/2021 14:47:01 -------------------------------------------------------------------------------- Lower Extremity Assessment Details Patient Name: Date of Service: Raymond Hurley MPSO N G. 01/26/2021 2:15 PM Medical Record Number: [id number] Patient Account Number: [id number] Date of Birth/Sex: Treating RN: 1930/11/19 (87 y.o. Erie Noe Primary Care Kiante Ciavarella: [name] Other Clinician: Referring Shanta Dorvil: Treating Sheena Simonis/Extender: [name] Weeks in Treatment: 2 Electronic Signature(s) Signed: 01/26/2021 5:30:00 PM By: Rhae Hammock RN Entered By: Rhae Hammock on 01/26/2021 14:07:19 -------------------------------------------------------------------------------- Multi Wound Chart Details Patient Name: Date of Service: Raymond Hurley MPSO N G. 01/26/2021 2:15 PM Medical Record Number: [id number] Patient Account Number: [id number] Date of Birth/Sex: Treating RN: [birth year] (87 y.o. Raymond Hurley Primary Care Patrina Andreas: [name] Other Clinician: Referring Donzella Carrol: Treating Moira Umholtz/Extender: [name] Weeks in Treatment: 2 Vital Signs Height(in): Pulse(bpm): 93 Weight(lbs): Blood Pressure(mmHg): 108/68 Body Mass Index(BMI): Temperature(F): 97.9 Respiratory Rate(breaths/min): 17 Photos: [1:No Photos Sacrum] [N/A:N/A N/A] Wound Location: [1:Gradually Appeared] [N/A:N/A] Wounding Event: [1:Pressure Ulcer] [N/A:N/A] Primary Etiology: [1:Anemia, Arrhythmia,  Hypotension, EndN/A] Comorbid History: [1:Stage Renal Disease 10/14/2020] [N/A:N/A] Date Acquired: [1:2] [N/A:N/A] Weeks of Treatment: [1:Open] [N/A:N/A] Wound Status: [1:0.6x0.4x0.5] [N/A:N/A] Measurements L x W x D (cm) [1:0.188] [N/A:N/A] A (cm) : rea [1:0.094] [N/A:N/A] Volume (cm) : [1:92.50%] [N/A:N/A] % Reduction in A rea: [1:97.10%] [N/A:N/A] % Reduction in Volume: [1:Category/Stage III] [N/A:N/A] Classification: [1:Medium] [N/A:N/A] Exudate A mount: [1:Serosanguineous] [N/A:N/A] Exudate Type: [1:red, brown] [N/A:N/A] Exudate Color: [1:Distinct, outline attached] [N/A:N/A] Wound Margin: [1:Large (67-100%)] [N/A:N/A] Granulation A mount: [1:Red, Pink] [N/A:N/A]  Granulation Quality: [1:None Present (0%)] [N/A:N/A] Necrotic A mount: [1:Fascia: No] [N/A:N/A] Exposed Structures: [1:Fat Layer (Subcutaneous Tissue): No Tendon: No Muscle: No Joint: No Bone: No Small (1-33%)] [N/A:N/A] Treatment Notes Wound #1 (Sacrum) Cleanser Soap and Water Discharge Instruction: May shower and wash wound with dial antibacterial soap and water prior to dressing change. Wound Cleanser Discharge Instruction: Cleanse the wound with wound cleanser prior to applying a clean dressing using gauze sponges, not tissue or cotton balls. Peri-Wound Care Topical Primary Dressing Promogran Prisma Matrix, 4.34 (sq in) (silver collagen) Discharge Instruction: Moisten with saline or hydrogel, tuck up into undermining Secondary Dressing Woven Gauze Sponge, Non-Sterile 4x4 in Discharge Instruction: Apply saline moistened gauze after Prisma ComfortFoam Border, 4x4 in (silicone border) Discharge Instruction: Apply over primary dressing as directed. Secured With Compression Wrap Compression Stockings Add-Ons Electronic Signature(s) Signed: 01/26/2021 3:17:24 PM By: Kalman Shan DO Signed: 01/26/2021 5:21:53 PM By: Lorrin Jackson Entered By: Kalman Shan on 01/26/2021  14:59:09 -------------------------------------------------------------------------------- Multi-Disciplinary Care Plan Details Patient Name: Date of Service: Raymond Hurley MPSO N G. 01/26/2021 2:15 PM Medical Record Number: [id number] Patient Account Number: [id number] Date of Birth/Sex: Treating RN: 02-Dec-1930 (87 y.o. Raymond Hurley Primary Care Harjot Zavadil: [name] Other Clinician: Referring Shyenne Maggard: Treating Alexsa Flaum/Extender: Cleatrice Burke Weeks in Treatment: 2 Active Inactive Orientation to the Wound Care Program Nursing Diagnoses: Knowledge deficit related to the wound healing center program Goals: Patient/caregiver will verbalize understanding of the Central Program Date Initiated: 01/12/2021 Target Resolution Date: 02/09/2021 Goal Status: Active Interventions: Provide education on orientation to the wound center Notes: Pressure Nursing Diagnoses: Knowledge deficit related to management of pressures ulcers Goals: Patient will remain free from development of additional pressure ulcers Date Initiated: 01/12/2021 Target Resolution Date: 02/16/2021 Goal Status: Active Interventions: Assess: immobility, friction, shearing, incontinence upon admission and as needed Assess offloading mechanisms upon admission and as needed Provide education on pressure ulcers Notes: Wound/Skin Impairment Nursing Diagnoses: Impaired tissue integrity Goals: Patient/caregiver will verbalize understanding of skin care regimen Date Initiated: 01/12/2021 Target Resolution Date: 02/09/2021 Goal Status: Active Ulcer/skin breakdown will have a volume reduction of 30% by week 4 Date Initiated: 01/12/2021 Target Resolution Date: 02/09/2021 Goal Status: Active Interventions: Assess patient/caregiver ability to obtain necessary supplies Assess patient/caregiver ability to perform ulcer/skin care regimen upon admission and as needed Assess ulceration(s) every  visit Provide education on ulcer and skin care Treatment Activities: Skin care regimen initiated : 01/12/2021 Topical wound management initiated : 01/12/2021 Notes: Electronic Signature(s) Signed: 01/26/2021 2:11:28 PM By: Lorrin Jackson Entered By: Lorrin Jackson on 01/26/2021 14:11:28 -------------------------------------------------------------------------------- Pain Assessment Details Patient Name: Date of Service: Raymond Hurley MPSO N G. 01/26/2021 2:15 PM Medical Record Number: [id number] Patient Account Number: [id number] Date of Birth/Sex: Treating RN: [birth year] (87 y.o. Erie Noe Primary Care [employer]: [name] Other Clinician: Referring Janeah Kovacich: Treating Jull Harral/Extender: [name] Weeks in Treatment: 2 Active Problems Location of Pain Severity and Description of Pain Patient Has Paino No Site Locations Pain Management and Medication Current Pain Management: Electronic Signature(s) Signed: 01/26/2021 5:30:00 PM By: Rhae Hammock RN Entered By: Rhae Hammock on 01/26/2021 14:07:13 -------------------------------------------------------------------------------- Patient/Caregiver Education Details Patient Name: Date of Service: Raymond Hurley 4/15/2022andnbsp2:15 PM Medical Record Number: [id number] Patient Account Number: [id number] Date of Birth/Gender: Treating RN: 1931/06/19 (87 y.o. Raymond Hurley Primary Care Physician: [name] Other Clinician: Referring Physician: Treating Physician/Extender: Cleatrice Burke Weeks in Treatment: 2 Education  Assessment Education Provided To: Patient Education Topics Provided Pressure: Methods: Explain/Verbal, Printed Responses: State content correctly Wound/Skin Impairment: Methods: Explain/Verbal, Printed Responses: State content correctly Electronic Signature(s) Signed: 01/26/2021 5:21:53 PM By: Lorrin Jackson Entered By: Lorrin Jackson on 01/26/2021 14:12:01 -------------------------------------------------------------------------------- Wound Assessment Details Patient Name: Date of Service: Raymond Hurley MPSO N G. 01/26/2021 2:15 PM Medical Record Number: [id number] Patient Account Number: [id number] Date of Birth/Sex: Treating RN: August 05, 1931 (87 y.o. Raymond Hurley, Raymond Hurley Primary Care Joziah Dollins: [name] Other Clinician: Referring Nykiah Ma: Treating Danta Baumgardner/Extender: [name] Weeks in Treatment: 2 Wound Status Wound Number: 1 Primary Etiology: Pressure Ulcer Wound Location: Sacrum Wound Status: Open Wounding Event: Gradually Appeared Comorbid Anemia, Arrhythmia, Hypotension, End Stage Renal History: Disease Date Acquired: 10/14/2020 Weeks Of Treatment: 2 Clustered Wound: No Photos Wound Measurements Length: (cm) 0.6 Width: (cm) 0.4 Depth: (cm) 0.5 Area: (cm) 0.188 Volume: (cm) 0.094 % Reduction in Area: 92.5% % Reduction in Volume: 97.1% Epithelialization: Small (1-33%) Tunneling: No Undermining: No Wound Description Classification: Category/Stage III Wound Margin: Distinct, outline attached Exudate Amount: Medium Exudate Type: Serosanguineous Exudate Color: red, brown Wound Bed Granulation Amount: Large (67-100%) Granulation Quality: Red, Pink Necrotic Amount: None Present (0%) Foul Odor After Cleansing: No Slough/Fibrino No Exposed Structure Fascia Exposed: No Fat Layer (Subcutaneous Tissue) Exposed: No Tendon Exposed: No Muscle Exposed: No Joint Exposed: No Bone Exposed: No Treatment Notes Wound #1 (Sacrum) Cleanser Soap and Water Discharge Instruction: May shower and wash wound with dial antibacterial soap and water prior to dressing change. Wound Cleanser Discharge Instruction: Cleanse the wound with wound cleanser prior to applying a clean dressing using gauze sponges, not tissue or cotton balls. Peri-Wound Care Topical Primary  Dressing Promogran Prisma Matrix, 4.34 (sq in) (silver collagen) Discharge Instruction: Moisten with saline or hydrogel, tuck up into undermining Secondary Dressing Woven Gauze Sponge, Non-Sterile 4x4 in Discharge Instruction: Apply saline moistened gauze after Prisma ComfortFoam Border, 4x4 in (silicone border) Discharge Instruction: Apply over primary dressing as directed. Secured With Compression Wrap Compression Stockings Environmental education officer) Signed: 01/26/2021 5:30:00 PM By: Rhae Hammock RN Signed: 01/30/2021 8:07:32 AM By: [name] Entered By: [name] on 01/26/2021 16:43:06 -------------------------------------------------------------------------------- Vitals Details Patient Name: Date of Service: Raymond Hurley MPSO N G. 01/26/2021 2:15 PM Medical Record Number: [id number] Patient Account Number: [id number] Date of Birth/Sex: Treating RN: 1931-08-28 (87 y.o. Erie Noe Primary Care Omya Winfield: [name] Other Clinician: Referring Halton Neas: Treating Yeily Link/Extender: [name] Weeks in Treatment: 2 Vital Signs Time Taken: 14:09 Temperature (F): 97.9 Pulse (bpm): 93 Respiratory Rate (breaths/min): 17 Blood Pressure (mmHg): 108/68 Reference Range: 80 - 120 mg / dl Electronic Signature(s) Signed: 01/26/2021 5:30:00 PM By: Rhae Hammock RN Entered By: Rhae Hammock on 01/26/2021 14:09:40

## 2021-01-31 ENCOUNTER — Other Ambulatory Visit: Payer: Self-pay | Admitting: Physical Medicine and Rehabilitation

## 2021-01-31 DIAGNOSIS — D631 Anemia in chronic kidney disease: Secondary | ICD-10-CM | POA: Diagnosis not present

## 2021-01-31 DIAGNOSIS — I4891 Unspecified atrial fibrillation: Secondary | ICD-10-CM | POA: Diagnosis not present

## 2021-01-31 DIAGNOSIS — I2510 Atherosclerotic heart disease of native coronary artery without angina pectoris: Secondary | ICD-10-CM | POA: Diagnosis not present

## 2021-01-31 DIAGNOSIS — I5022 Chronic systolic (congestive) heart failure: Secondary | ICD-10-CM | POA: Diagnosis not present

## 2021-01-31 DIAGNOSIS — L89313 Pressure ulcer of right buttock, stage 3: Secondary | ICD-10-CM | POA: Diagnosis not present

## 2021-01-31 DIAGNOSIS — N186 End stage renal disease: Secondary | ICD-10-CM | POA: Diagnosis not present

## 2021-02-01 DIAGNOSIS — Z992 Dependence on renal dialysis: Secondary | ICD-10-CM | POA: Diagnosis not present

## 2021-02-01 DIAGNOSIS — D509 Iron deficiency anemia, unspecified: Secondary | ICD-10-CM | POA: Diagnosis not present

## 2021-02-01 DIAGNOSIS — D631 Anemia in chronic kidney disease: Secondary | ICD-10-CM | POA: Diagnosis not present

## 2021-02-01 DIAGNOSIS — N186 End stage renal disease: Secondary | ICD-10-CM | POA: Diagnosis not present

## 2021-02-01 DIAGNOSIS — N2581 Secondary hyperparathyroidism of renal origin: Secondary | ICD-10-CM | POA: Diagnosis not present

## 2021-02-02 ENCOUNTER — Telehealth: Payer: Self-pay

## 2021-02-02 DIAGNOSIS — L89313 Pressure ulcer of right buttock, stage 3: Secondary | ICD-10-CM | POA: Diagnosis not present

## 2021-02-02 DIAGNOSIS — I5022 Chronic systolic (congestive) heart failure: Secondary | ICD-10-CM | POA: Diagnosis not present

## 2021-02-02 DIAGNOSIS — I2510 Atherosclerotic heart disease of native coronary artery without angina pectoris: Secondary | ICD-10-CM | POA: Diagnosis not present

## 2021-02-02 DIAGNOSIS — D631 Anemia in chronic kidney disease: Secondary | ICD-10-CM | POA: Diagnosis not present

## 2021-02-02 DIAGNOSIS — N186 End stage renal disease: Secondary | ICD-10-CM | POA: Diagnosis not present

## 2021-02-02 DIAGNOSIS — I4891 Unspecified atrial fibrillation: Secondary | ICD-10-CM | POA: Diagnosis not present

## 2021-02-02 NOTE — Telephone Encounter (Signed)
We can re certify.  Thanks.

## 2021-02-02 NOTE — Telephone Encounter (Signed)
Raymond Hurley with [facility] has requested re-certification of Occupational Therapy for Raymond Hurley.  Home visits to be once a week for 7 weeks.  Please advise.

## 2021-02-03 DIAGNOSIS — N2581 Secondary hyperparathyroidism of renal origin: Secondary | ICD-10-CM | POA: Diagnosis not present

## 2021-02-03 DIAGNOSIS — D631 Anemia in chronic kidney disease: Secondary | ICD-10-CM | POA: Diagnosis not present

## 2021-02-03 DIAGNOSIS — N186 End stage renal disease: Secondary | ICD-10-CM | POA: Diagnosis not present

## 2021-02-03 DIAGNOSIS — Z992 Dependence on renal dialysis: Secondary | ICD-10-CM | POA: Diagnosis not present

## 2021-02-03 DIAGNOSIS — D509 Iron deficiency anemia, unspecified: Secondary | ICD-10-CM | POA: Diagnosis not present

## 2021-02-05 ENCOUNTER — Encounter: Payer: Medicare Other | Attending: Registered Nurse | Admitting: Physical Medicine & Rehabilitation

## 2021-02-05 ENCOUNTER — Other Ambulatory Visit: Payer: Self-pay

## 2021-02-05 ENCOUNTER — Other Ambulatory Visit: Payer: Self-pay | Admitting: Physical Medicine and Rehabilitation

## 2021-02-05 ENCOUNTER — Encounter: Payer: Self-pay | Admitting: Physical Medicine & Rehabilitation

## 2021-02-05 VITALS — BP 103/67 | HR 84 | Temp 98.1°F | Ht 69.0 in | Wt 162.1 lb

## 2021-02-05 DIAGNOSIS — R42 Dizziness and giddiness: Secondary | ICD-10-CM | POA: Insufficient documentation

## 2021-02-05 DIAGNOSIS — I5022 Chronic systolic (congestive) heart failure: Secondary | ICD-10-CM | POA: Diagnosis not present

## 2021-02-05 DIAGNOSIS — I2699 Other pulmonary embolism without acute cor pulmonale: Secondary | ICD-10-CM | POA: Insufficient documentation

## 2021-02-05 DIAGNOSIS — N186 End stage renal disease: Secondary | ICD-10-CM | POA: Insufficient documentation

## 2021-02-05 DIAGNOSIS — I4891 Unspecified atrial fibrillation: Secondary | ICD-10-CM | POA: Diagnosis not present

## 2021-02-05 DIAGNOSIS — I2510 Atherosclerotic heart disease of native coronary artery without angina pectoris: Secondary | ICD-10-CM | POA: Diagnosis not present

## 2021-02-05 DIAGNOSIS — Z992 Dependence on renal dialysis: Secondary | ICD-10-CM | POA: Diagnosis not present

## 2021-02-05 DIAGNOSIS — L89313 Pressure ulcer of right buttock, stage 3: Secondary | ICD-10-CM | POA: Diagnosis not present

## 2021-02-05 DIAGNOSIS — D631 Anemia in chronic kidney disease: Secondary | ICD-10-CM | POA: Diagnosis not present

## 2021-02-05 DIAGNOSIS — K861 Other chronic pancreatitis: Secondary | ICD-10-CM | POA: Insufficient documentation

## 2021-02-05 DIAGNOSIS — R5381 Other malaise: Secondary | ICD-10-CM | POA: Insufficient documentation

## 2021-02-05 NOTE — Progress Notes (Signed)
Subjective:     Patient ID: Raymond Hurley., male   DOB: [birth year], 87 y.o.   MRN: [id number]  HPI Male with history of melanoma, vitamin B12 deficiency, A. fib-on Eliquis was admitted on 10/14/2020 with acute right facial droop, dysarthria and difficulty following commands presents for follow up for debility.   Last clinic visit with NP on 12/18/20, notes reviewed.  Wife supplements history.  Since that time, pt has been following up with PCP. He saw Cards. Wound is healing. Appetite has improved.  He has not followed up with GI. Some discrepancy regarding weight goals with family. Denies falls. Dizziness has improved.  Recently started using rolling walker short distances.  He has Sigel 24/7. Pt with imaging results, report reviewed.   Therapies: 1/week. DME: commode seat, shower chair Mobility: Wheelchair and walker  Pain Inventory Average Pain 0 Pain Right Now 0 My pain is tail bone pressure pain when sitting.  In the last 24 hours, has pain interfered with the following? General activity 0 Relation with others 0 Enjoyment of life 0 What TIME of day is your pain at its worst? No pain just tail bone pressure. Sleep (in general) Good  Pain is worse with: No pain. Pain improves with: No pain. Relief from Meds: No pain medication taken.  Family History  Problem Relation Age of Onset  . Melanoma Daughter    Social History   Socioeconomic History  . Marital status: Married    Spouse name: Not on file  . Number of children: Not on file  . Years of education: Not on file  . Highest education level: Not on file  Occupational History  . Not on file  Tobacco Use  . Smoking status: Never Smoker  . Smokeless tobacco: Never Used  Vaping Use  . Vaping Use: Never used  Substance and Sexual Activity  . Alcohol use: Yes    Alcohol/week: 7.0 standard drinks    Types: 7 Glasses of wine per week  . Drug use: Never  . Sexual activity: Not on file  Other Topics Concern  . Not on  file  Social History Narrative  . Not on file   Social Determinants of Health   Financial Resource Strain: Not on file  Food Insecurity: Not on file  Transportation Needs: Not on file  Physical Activity: Not on file  Stress: Not on file  Social Connections: Not on file   Past Surgical History:  Procedure Laterality Date  . CARDIOVERSION N/A 06/02/2019   Procedure: CARDIOVERSION;  Surgeon: Jerline Pain, MD;  Location: [facility] ENDOSCOPY;  Service: Cardiovascular;  Laterality: N/A;  . IR FLUORO GUIDE CV LINE RIGHT  11/06/2020  . IR US GUIDE VASC ACCESS RIGHT  11/06/2020  . RIGHT/LEFT HEART CATH AND CORONARY ANGIOGRAPHY N/A 04/27/2019   Procedure: RIGHT/LEFT HEART CATH AND CORONARY ANGIOGRAPHY;  Surgeon: Jolaine Artist, MD;  Location: [place] CV LAB;  Service: Cardiovascular;  Laterality: N/A;  . TEE WITHOUT CARDIOVERSION N/A 06/02/2019   Procedure: TRANSESOPHAGEAL ECHOCARDIOGRAM (TEE);  Surgeon: Jerline Pain, MD;  Location: [facility] ENDOSCOPY;  Service: Cardiovascular;  Laterality: N/A;   Past Surgical History:  Procedure Laterality Date  . CARDIOVERSION N/A 06/02/2019   Procedure: CARDIOVERSION;  Surgeon: Jerline Pain, MD;  Location: [facility] ENDOSCOPY;  Service: Cardiovascular;  Laterality: N/A;  . IR FLUORO GUIDE CV LINE RIGHT  11/06/2020  . IR US GUIDE VASC ACCESS RIGHT  11/06/2020  . RIGHT/LEFT HEART CATH AND CORONARY ANGIOGRAPHY N/A 04/27/2019  Procedure: RIGHT/LEFT HEART CATH AND CORONARY ANGIOGRAPHY;  Surgeon: Jolaine Artist, MD;  Location: [place] CV LAB;  Service: Cardiovascular;  Laterality: N/A;  . TEE WITHOUT CARDIOVERSION N/A 06/02/2019   Procedure: TRANSESOPHAGEAL ECHOCARDIOGRAM (TEE);  Surgeon: Jerline Pain, MD;  Location: [facility] ENDOSCOPY;  Service: Cardiovascular;  Laterality: N/A;   Past Medical History:  Diagnosis Date  . Acute pancreatitis   . Atrial fibrillation ([place])   . CAD (coronary artery disease) 04/2019  . CHF (congestive heart failure) ([place])   . Melanoma  ([place])   . Vitamin B 12 deficiency    BP 103/67   Pulse 84   Temp 98.1 F (36.7 C)   Ht 5\' 9"  (1.753 m)   Wt 162 lb 1.6 oz (73.5 kg)   SpO2 97%   BMI 23.94 kg/m   Opioid Risk Score:   Fall Risk Score:  `1  Depression screen PHQ 2/9  Depression screen PHQ 2/9 12/18/2020  Down, Depressed, Hopeless 0  PHQ - 2 Score 0  Altered sleeping 0  Tired, decreased energy 3  Change in appetite 3  Feeling bad or failure about yourself  0  Trouble concentrating 1  Moving slowly or fidgety/restless 2  Suicidal thoughts 0  PHQ-9 Score 9   Review of Systems  Respiratory: Positive for shortness of breath.   Musculoskeletal: Positive for gait problem.  Skin: Positive for wound.       Coccyx  pain  Neurological: Positive for dizziness.  Hematological: Bruises/bleeds easily.  All other systems reviewed and are negative.      Objective:   Physical Exam  Constitutional: No distress . Vital signs reviewed. HENT: Normocephalic.  Atraumatic. Eyes: EOMI. No discharge. Cardiovascular: No JVD.   Respiratory: Normal effort.  No stridor.   GI: Non-distended.   Skin: Warm and dry.   Psych: Normal mood.  Normal behavior. Musc: Bilateral lower extremity edema No tenderness in extremities. Neuro: Alert Motor: Bilateral upper extremities: 4+/5 throughout, stable Left lower extremity: Hip flexion, knee extension 4-/5, ankle dorsiflexion 4+/5, unchanged Right lower extremity: Hip flexion, knee extension 3 +-4-/5, ankle dorsiflexion 4+/5, stable     Assessment:    Plan:  Male with history of melanoma, vitamin B12 deficiency, A. fib-on Eliquis was admitted on 10/14/2020 with acute right facial droop, dysarthria and difficulty following commands presents for follow up for debility.   1.  Generalized weakness affecting ability to stand or complete ADL tasks secondary to debility.  Continue therapies  2. PE  Following up with Oncology - observing clot at this time  3. Skin/Wound Care: Routine  pressure relief measures.              Continue to follow up wound care  Improving  4.  ESRD:   Follow up with Nephro  5. Chronic systolic CHF/CAF:   Follow up with Cards, pt with some concern about ideal weight  6.  Pancreatitis:   With ascites   Follow up with GI, needs appointment  7.  Dizziness  Improving   Notes reviewed Meds reviewed All questions answered  >30 minutes spent with patient and wife discussing history since discharge, current functional level, functional progress, prognosis, treatment options

## 2021-02-05 NOTE — Telephone Encounter (Signed)
Notified Michelle

## 2021-02-06 ENCOUNTER — Telehealth: Payer: Self-pay

## 2021-02-06 DIAGNOSIS — Z992 Dependence on renal dialysis: Secondary | ICD-10-CM | POA: Diagnosis not present

## 2021-02-06 DIAGNOSIS — I4891 Unspecified atrial fibrillation: Secondary | ICD-10-CM | POA: Diagnosis not present

## 2021-02-06 DIAGNOSIS — Z9181 History of falling: Secondary | ICD-10-CM | POA: Diagnosis not present

## 2021-02-06 DIAGNOSIS — R1310 Dysphagia, unspecified: Secondary | ICD-10-CM | POA: Diagnosis not present

## 2021-02-06 DIAGNOSIS — Z8616 Personal history of COVID-19: Secondary | ICD-10-CM | POA: Diagnosis not present

## 2021-02-06 DIAGNOSIS — L89313 Pressure ulcer of right buttock, stage 3: Secondary | ICD-10-CM | POA: Diagnosis not present

## 2021-02-06 DIAGNOSIS — R471 Dysarthria and anarthria: Secondary | ICD-10-CM | POA: Diagnosis not present

## 2021-02-06 DIAGNOSIS — D509 Iron deficiency anemia, unspecified: Secondary | ICD-10-CM | POA: Diagnosis not present

## 2021-02-06 DIAGNOSIS — D631 Anemia in chronic kidney disease: Secondary | ICD-10-CM | POA: Diagnosis not present

## 2021-02-06 DIAGNOSIS — J690 Pneumonitis due to inhalation of food and vomit: Secondary | ICD-10-CM | POA: Diagnosis not present

## 2021-02-06 DIAGNOSIS — E538 Deficiency of other specified B group vitamins: Secondary | ICD-10-CM | POA: Diagnosis not present

## 2021-02-06 DIAGNOSIS — Z85820 Personal history of malignant melanoma of skin: Secondary | ICD-10-CM | POA: Diagnosis not present

## 2021-02-06 DIAGNOSIS — I5022 Chronic systolic (congestive) heart failure: Secondary | ICD-10-CM | POA: Diagnosis not present

## 2021-02-06 DIAGNOSIS — Z79899 Other long term (current) drug therapy: Secondary | ICD-10-CM | POA: Diagnosis not present

## 2021-02-06 DIAGNOSIS — I2510 Atherosclerotic heart disease of native coronary artery without angina pectoris: Secondary | ICD-10-CM | POA: Diagnosis not present

## 2021-02-06 DIAGNOSIS — I959 Hypotension, unspecified: Secondary | ICD-10-CM | POA: Diagnosis not present

## 2021-02-06 DIAGNOSIS — N186 End stage renal disease: Secondary | ICD-10-CM | POA: Diagnosis not present

## 2021-02-06 DIAGNOSIS — N179 Acute kidney failure, unspecified: Secondary | ICD-10-CM | POA: Diagnosis not present

## 2021-02-06 DIAGNOSIS — K5901 Slow transit constipation: Secondary | ICD-10-CM | POA: Diagnosis not present

## 2021-02-06 DIAGNOSIS — G9340 Encephalopathy, unspecified: Secondary | ICD-10-CM | POA: Diagnosis not present

## 2021-02-06 DIAGNOSIS — N2581 Secondary hyperparathyroidism of renal origin: Secondary | ICD-10-CM | POA: Diagnosis not present

## 2021-02-06 NOTE — Telephone Encounter (Signed)
Luan Pulling, physical therapist with [facility] has requested re-certification for physical therapy. Session to be  In-home, once a week for 9 weeks.   Per Luan Pulling Mr. Bodey is making considerable improvements.   (Call back phone  [facility]  PT [phone])

## 2021-02-07 NOTE — Telephone Encounter (Signed)
We can re certify.  Thanks.

## 2021-02-07 NOTE — Telephone Encounter (Signed)
Notified Herbert Deaner PT [facility].

## 2021-02-08 DIAGNOSIS — D631 Anemia in chronic kidney disease: Secondary | ICD-10-CM | POA: Diagnosis not present

## 2021-02-08 DIAGNOSIS — Z992 Dependence on renal dialysis: Secondary | ICD-10-CM | POA: Diagnosis not present

## 2021-02-08 DIAGNOSIS — D509 Iron deficiency anemia, unspecified: Secondary | ICD-10-CM | POA: Diagnosis not present

## 2021-02-08 DIAGNOSIS — N2581 Secondary hyperparathyroidism of renal origin: Secondary | ICD-10-CM | POA: Diagnosis not present

## 2021-02-08 DIAGNOSIS — N186 End stage renal disease: Secondary | ICD-10-CM | POA: Diagnosis not present

## 2021-02-09 ENCOUNTER — Encounter (HOSPITAL_BASED_OUTPATIENT_CLINIC_OR_DEPARTMENT_OTHER): Payer: Medicare Other | Admitting: Internal Medicine

## 2021-02-09 DIAGNOSIS — L89313 Pressure ulcer of right buttock, stage 3: Secondary | ICD-10-CM | POA: Diagnosis not present

## 2021-02-09 DIAGNOSIS — N186 End stage renal disease: Secondary | ICD-10-CM | POA: Diagnosis not present

## 2021-02-09 DIAGNOSIS — I4891 Unspecified atrial fibrillation: Secondary | ICD-10-CM | POA: Diagnosis not present

## 2021-02-09 DIAGNOSIS — I2510 Atherosclerotic heart disease of native coronary artery without angina pectoris: Secondary | ICD-10-CM | POA: Diagnosis not present

## 2021-02-09 DIAGNOSIS — D631 Anemia in chronic kidney disease: Secondary | ICD-10-CM | POA: Diagnosis not present

## 2021-02-09 DIAGNOSIS — I5022 Chronic systolic (congestive) heart failure: Secondary | ICD-10-CM | POA: Diagnosis not present

## 2021-02-10 DIAGNOSIS — D509 Iron deficiency anemia, unspecified: Secondary | ICD-10-CM | POA: Diagnosis not present

## 2021-02-10 DIAGNOSIS — N186 End stage renal disease: Secondary | ICD-10-CM | POA: Diagnosis not present

## 2021-02-10 DIAGNOSIS — D631 Anemia in chronic kidney disease: Secondary | ICD-10-CM | POA: Diagnosis not present

## 2021-02-10 DIAGNOSIS — N2581 Secondary hyperparathyroidism of renal origin: Secondary | ICD-10-CM | POA: Diagnosis not present

## 2021-02-10 DIAGNOSIS — Z992 Dependence on renal dialysis: Secondary | ICD-10-CM | POA: Diagnosis not present

## 2021-02-11 DIAGNOSIS — I129 Hypertensive chronic kidney disease with stage 1 through stage 4 chronic kidney disease, or unspecified chronic kidney disease: Secondary | ICD-10-CM | POA: Diagnosis not present

## 2021-02-11 DIAGNOSIS — Z992 Dependence on renal dialysis: Secondary | ICD-10-CM | POA: Diagnosis not present

## 2021-02-11 DIAGNOSIS — N186 End stage renal disease: Secondary | ICD-10-CM | POA: Diagnosis not present

## 2021-02-12 DIAGNOSIS — I5022 Chronic systolic (congestive) heart failure: Secondary | ICD-10-CM | POA: Diagnosis not present

## 2021-02-12 DIAGNOSIS — I4891 Unspecified atrial fibrillation: Secondary | ICD-10-CM | POA: Diagnosis not present

## 2021-02-12 DIAGNOSIS — I2510 Atherosclerotic heart disease of native coronary artery without angina pectoris: Secondary | ICD-10-CM | POA: Diagnosis not present

## 2021-02-12 DIAGNOSIS — N186 End stage renal disease: Secondary | ICD-10-CM | POA: Diagnosis not present

## 2021-02-12 DIAGNOSIS — D631 Anemia in chronic kidney disease: Secondary | ICD-10-CM | POA: Diagnosis not present

## 2021-02-12 DIAGNOSIS — L89313 Pressure ulcer of right buttock, stage 3: Secondary | ICD-10-CM | POA: Diagnosis not present

## 2021-02-13 DIAGNOSIS — N2581 Secondary hyperparathyroidism of renal origin: Secondary | ICD-10-CM | POA: Diagnosis not present

## 2021-02-13 DIAGNOSIS — N186 End stage renal disease: Secondary | ICD-10-CM | POA: Diagnosis not present

## 2021-02-13 DIAGNOSIS — D509 Iron deficiency anemia, unspecified: Secondary | ICD-10-CM | POA: Diagnosis not present

## 2021-02-13 DIAGNOSIS — Z992 Dependence on renal dialysis: Secondary | ICD-10-CM | POA: Diagnosis not present

## 2021-02-13 DIAGNOSIS — D631 Anemia in chronic kidney disease: Secondary | ICD-10-CM | POA: Diagnosis not present

## 2021-02-15 DIAGNOSIS — N2581 Secondary hyperparathyroidism of renal origin: Secondary | ICD-10-CM | POA: Diagnosis not present

## 2021-02-15 DIAGNOSIS — D631 Anemia in chronic kidney disease: Secondary | ICD-10-CM | POA: Diagnosis not present

## 2021-02-15 DIAGNOSIS — N186 End stage renal disease: Secondary | ICD-10-CM | POA: Diagnosis not present

## 2021-02-15 DIAGNOSIS — D509 Iron deficiency anemia, unspecified: Secondary | ICD-10-CM | POA: Diagnosis not present

## 2021-02-15 DIAGNOSIS — Z992 Dependence on renal dialysis: Secondary | ICD-10-CM | POA: Diagnosis not present

## 2021-02-16 ENCOUNTER — Encounter (HOSPITAL_BASED_OUTPATIENT_CLINIC_OR_DEPARTMENT_OTHER): Payer: Medicare Other | Attending: Internal Medicine | Admitting: Internal Medicine

## 2021-02-16 ENCOUNTER — Other Ambulatory Visit: Payer: Self-pay

## 2021-02-16 ENCOUNTER — Other Ambulatory Visit: Payer: Self-pay | Admitting: *Deleted

## 2021-02-16 DIAGNOSIS — L89153 Pressure ulcer of sacral region, stage 3: Secondary | ICD-10-CM | POA: Diagnosis present

## 2021-02-16 DIAGNOSIS — N186 End stage renal disease: Secondary | ICD-10-CM | POA: Insufficient documentation

## 2021-02-16 DIAGNOSIS — E43 Unspecified severe protein-calorie malnutrition: Secondary | ICD-10-CM | POA: Diagnosis not present

## 2021-02-16 DIAGNOSIS — Z85820 Personal history of malignant melanoma of skin: Secondary | ICD-10-CM | POA: Insufficient documentation

## 2021-02-16 DIAGNOSIS — I255 Ischemic cardiomyopathy: Secondary | ICD-10-CM | POA: Diagnosis not present

## 2021-02-16 DIAGNOSIS — I4891 Unspecified atrial fibrillation: Secondary | ICD-10-CM | POA: Diagnosis not present

## 2021-02-16 DIAGNOSIS — Z992 Dependence on renal dialysis: Secondary | ICD-10-CM | POA: Insufficient documentation

## 2021-02-16 NOTE — Progress Notes (Signed)
Raymond Hurley ([id number]) Visit Report for 02/16/2021 Chief Complaint Document Details Patient Name: Date of Service: Raymond Hurley 02/16/2021 1:45 PM Medical Record Number: [id number] Patient Account Number: [id number] Date of Birth/Sex: Treating RN: 1931-04-21 (87 y.o. Raymond Hurley Primary Care Provider: Lajean Manes Hurley Other Clinician: Referring Provider: Treating Provider/Extender: Raymond Hurley Weeks in Treatment: 5 Information Obtained from: Patient Chief Complaint 01/12/2021; patient is here for review of sacral pressure ulcer Electronic Signature(s) Signed: 02/16/2021 2:20:04 PM By: Raymond Shan DO Entered By: Raymond Hurley on 02/16/2021 14:14:15 -------------------------------------------------------------------------------- HPI Details Patient Name: Date of Service: Raymond [name] MPSO N G. 02/16/2021 1:45 PM Medical Record Number: [id number] Patient Account Number: [id number] Date of Birth/Sex: Treating RN: May 15, 1931 (87 y.o. Raymond Hurley Primary Care Provider: Patrina Hurley Other Clinician: Referring Provider: Treating Provider/Extender: Raymond Hurley Weeks in Treatment: 5 History of Present Illness HPI Description: ADMISSION 01/12/2021 This is an 87 year old man who comes into the clinic with his wife for review of a stage III pressure ulcer on the lower sacrum. This is actually an improvement. Historically he had a prolonged hospitalization at [facility] from 11/10/2020 through 12/07/2020 presenting with a combination of Covid as well as severe acute pancreatitis. 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 He had a GI bleed with hematochezia requiring transfusions. When he left the hospital his albumin was 2.1. His wife is able to show our staff pictures of improvement in the wound they have been using wet-to-dry dressings. They are also meticulously offloading him as best they can although they  report his intake is still marginal While he was in hospital he went into acute renal failure. I am not really sure of his baseline creatinine however he ended up requiring dialysis and he is continued on dialysis Tuesday Thursday and Saturday. They are really trying I think to help him offload this area. It would appear that he tolerates dialysis very poorly when he gets home he is exhausted really cannot eat. Past medical history includes atrial fibrillation, nonischemic ischemic cardiomyopathy, history of melanoma, hiatal hernia. He is on a dysphagia 3 diet 4/15; patient presents for 2-week follow-up. He reports no issues since last clinic visit. He has been using silver collagen every other day and repositioning onto his sides every 2 hours to relieve pressure to his bottom. Him and his wife are pleased with the wound healing progress so far. 5/6; patient presents for 2-week follow-up. He has been using collagen to the wound every other day and repositioning on his sides every 2 hours. Today he reports that the wound is closed. Electronic Signature(s) Signed: 02/16/2021 2:20:04 PM By: Raymond Shan DO Entered By: Raymond Hurley on 02/16/2021 14:15:19 -------------------------------------------------------------------------------- Physical Exam Details Patient Name: Date of Service: Raymond Banks MPSO N G. 02/16/2021 1:45 PM Medical Record Number: [id number] Patient Account Number: [id number] Date of Birth/Sex: Treating RN: October 01, 1931 (87 y.o. Raymond Hurley Primary Care Provider: Patrina Hurley Other Clinician: Referring Provider: Treating Provider/Extender: Raymond Hurley Weeks in Treatment: 5 Constitutional respirations regular, non-labored and within target range for patient.Marland Kitchen Psychiatric pleasant and cooperative. Notes Sacral ulcer: There is epithelialized tissue from a previous the previous wound site. There are no signs of infection. There is no  induration. Surrounding skin is intact and healthy. There is no increased warmth or erythema. Electronic Signature(s) Signed: 02/16/2021 2:20:04 PM By: Raymond Shan DO Entered By: Raymond Hurley on 02/16/2021  98:92:11 -------------------------------------------------------------------------------- Physician Orders Details Patient Name: Date of Service: Raymond Hurley 02/16/2021 1:45 PM Medical Record Number: [id number] Patient Account Number: [id number] Date of Birth/Sex: Treating RN: April 29, 1931 (87 y.o. Raymond Hurley Primary Care Provider: Patrina Hurley Other Clinician: Referring Provider: Treating Provider/Extender: Raymond Hurley Weeks in Treatment: 5 Verbal / Phone Orders: No Diagnosis Coding ICD-10 Coding Code Description L89.153 Pressure ulcer of sacral region, stage 3 K85.81 Other acute pancreatitis with uninfected necrosis E43 Unspecified severe protein-calorie malnutrition Discharge From [facility] Services Discharge from [place] daily to protect newly healed area. Off-Loading Turn and reposition every 2 hours [facility] for wound care. Electronic Signature(s) Signed: 02/16/2021 2:20:04 PM By: Raymond Shan DO Previous Signature: 02/16/2021 1:33:30 PM Version By: Raymond Hurley Entered By: Raymond Hurley on 02/16/2021 14:16:22 -------------------------------------------------------------------------------- Problem List Details Patient Name: Date of Service: Raymond Banks MPSO N G. 02/16/2021 1:45 PM Medical Record Number: [id number] Patient Account Number: [id number] Date of Birth/Sex: Treating RN: [birth year] (87 y.o. Raymond Hurley Primary Care Provider: Lajean Manes Hurley Other Clinician: Referring Provider: Treating Provider/Extender: Raymond Hurley Weeks in Treatment: 5 Active Problems ICD-10 Encounter Code Description Active Date MDM Diagnosis L89.153 Pressure  ulcer of sacral region, stage 3 01/12/2021 No Yes K85.81 Other acute pancreatitis with uninfected necrosis 01/12/2021 No Yes E43 Unspecified severe protein-calorie malnutrition 01/12/2021 No Yes Inactive Problems Resolved Problems Electronic Signature(s) Signed: 02/16/2021 2:20:04 PM By: Raymond Shan DO Previous Signature: 02/16/2021 1:33:02 PM Version By: Raymond Hurley Entered By: Raymond Hurley on 02/16/2021 14:13:58 -------------------------------------------------------------------------------- Progress Note Details Patient Name: Date of Service: Raymond [name] MPSO N G. 02/16/2021 1:45 PM Medical Record Number: [id number] Patient Account Number: [id number] Date of Birth/Sex: Treating RN: January 20, 1931 (87 y.o. Raymond Hurley Primary Care Provider: Patrina Hurley Other Clinician: Referring Provider: Treating Provider/Extender: Raymond Hurley Weeks in Treatment: 5 Subjective Chief Complaint Information obtained from Patient 01/12/2021; patient is here for review of sacral pressure ulcer History of Present Illness (HPI) ADMISSION 01/12/2021 This is an 87 year old man who comes into the clinic with his wife for review of a stage III pressure ulcer on the lower sacrum. This is actually an improvement. Historically he had a prolonged hospitalization at [facility] from 11/10/2020 through 12/07/2020 presenting with a combination of Covid as well as severe acute pancreatitis. 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 He had a GI bleed with hematochezia requiring transfusions. When he left the hospital his albumin was 2.1. His wife is able to show our staff pictures of improvement in the wound they have been using wet-to-dry dressings. They are also meticulously offloading him as best they can although they report his intake is still marginal While he was in hospital he went into acute renal failure. I am not really sure of his baseline creatinine however he ended up  requiring dialysis and he is continued on dialysis Tuesday Thursday and Saturday. They are really trying I think to help him offload this area. It would appear that he tolerates dialysis very poorly when he gets home he is exhausted really cannot eat. Past medical history includes atrial fibrillation, nonischemic ischemic cardiomyopathy, history of melanoma, hiatal hernia. He is on a dysphagia 3 diet 4/15; patient presents for 2-week follow-up. He reports no issues since last clinic visit. He has been using silver collagen every other day and repositioning onto his sides every 2 hours  to relieve pressure to his bottom. Him and his wife are pleased with the wound healing progress so far. 5/6; patient presents for 2-week follow-up. He has been using collagen to the wound every other day and repositioning on his sides every 2 hours. Today he reports that the wound is closed. Patient History Information obtained from Patient. Family History Unknown History. Social History Never smoker, Marital Status - Married, Alcohol Use - Moderate, Drug Use - No History, Caffeine Use - Never. Medical History Eyes Denies history of Cataracts, Glaucoma, Optic Neuritis Ear/Nose/Mouth/Throat Denies history of Chronic sinus problems/congestion, Middle ear problems Hematologic/Lymphatic Patient has history of Anemia Denies history of Hemophilia, Human Immunodeficiency Virus, Lymphedema, Sickle Cell Disease Respiratory Denies history of Aspiration, Asthma, Chronic Obstructive Pulmonary Disease (COPD), Pneumothorax, Sleep Apnea, Tuberculosis Cardiovascular Patient has history of Arrhythmia - A fibb, Hypotension - hemodialysis-associated Denies history of Angina, Congestive Heart Failure, Coronary Artery Disease, Deep Vein Thrombosis, Hypertension, Myocardial Infarction, Peripheral Arterial Disease, Peripheral Venous Disease, Phlebitis, Vasculitis Gastrointestinal Denies history of Cirrhosis , Colitis, Crohnoos,  Hepatitis A, Hepatitis B, Hepatitis C Endocrine Denies history of Type I Diabetes, Type II Diabetes Genitourinary Patient has history of End Stage Renal Disease - on dialysis Hurley Th, Sat. , Immunological Denies history of Lupus Erythematosus, Raynaudoos, Scleroderma Integumentary (Skin) Denies history of History of Burn Musculoskeletal Denies history of Gout, Rheumatoid Arthritis, Osteoarthritis, Osteomyelitis Neurologic Denies history of Dementia, Neuropathy, Quadriplegia, Paraplegia, Seizure Disorder Oncologic Denies history of Received Chemotherapy, Received Radiation Hospitalization/Surgery History - pancreatitis. Medical A Surgical History Notes nd Cardiovascular ischemic cardiomyopathy Objective Constitutional respirations regular, non-labored and within target range for patient.. Vitals Time Taken: 1:43 PM, Temperature: 98.0 F, Pulse: 76 bpm, Respiratory Rate: 17 breaths/min, Blood Pressure: 122/78 mmHg. Psychiatric pleasant and cooperative. General Notes: Sacral ulcer: There is epithelialized tissue from a previous the previous wound site. There are no signs of infection. There is no induration. Surrounding skin is intact and healthy. There is no increased warmth or erythema. Integumentary (Hair, Skin) Wound #1 status is Open. Original cause of wound was Gradually Appeared. The date acquired was: 10/14/2020. The wound has been in treatment 5 weeks. The wound is located on the Sacrum. The wound measures 0cm length x 0cm width x 0cm depth; 0cm^2 area and 0cm^3 volume. Assessment Active Problems ICD-10 Pressure ulcer of sacral region, stage 3 Other acute pancreatitis with uninfected necrosis Unspecified severe protein-calorie malnutrition Patient presents with a healed wound to the sacrum. We discussed keeping the area protected for about 1 to 2 weeks as this continues to fully heal. He mostly uses a wheelchair for mobility and we discussed continuing to reposition in the  chair and to be aware that this can be a reoccurring issue if not assessed. I recommended looking at the area daily to make sure there is no skin breakdown. Overall everything looks great. He can follow-up as needed Plan Discharge From [facility] Services: Discharge from [place] daily to protect newly healed area. Off-Loading: Turn and reposition every 2 hours Home Health: [place] [facility] for wound care. Discharge from the clinic Follow-up as needed Keep area protected for 1 to 2 weeks Electronic Signature(s) Signed: 02/16/2021 2:20:04 PM By: Raymond Shan DO Entered By: Raymond Hurley on 02/16/2021 14:18:21 -------------------------------------------------------------------------------- HxROS Details Patient Name: Date of Service: Raymond [name] MPSO N G. 02/16/2021 1:45 PM Medical Record Number: [id number] Patient Account Number: [id number] Date of Birth/Sex: Treating RN: [birth year] (87 y.o. Raymond Hurley Primary Care Provider: Felipa Eth,  Hal Hurley Other Clinician: Referring Provider: Treating Provider/Extender: Raymond Hurley Weeks in Treatment: 5 Information Obtained From Patient Eyes Medical History: Negative for: Cataracts; Glaucoma; Optic Neuritis Ear/Nose/Mouth/Throat Medical History: Negative for: Chronic sinus problems/congestion; Middle ear problems Hematologic/Lymphatic Medical History: Positive for: Anemia Negative for: Hemophilia; Human Immunodeficiency Virus; Lymphedema; Sickle Cell Disease Respiratory Medical History: Negative for: Aspiration; Asthma; Chronic Obstructive Pulmonary Disease (COPD); Pneumothorax; Sleep Apnea; Tuberculosis Cardiovascular Medical History: Positive for: Arrhythmia - A fibb; Hypotension - hemodialysis-associated Negative for: Angina; Congestive Heart Failure; Coronary Artery Disease; Deep Vein Thrombosis; Hypertension; Myocardial Infarction; Peripheral Arterial Disease; Peripheral  Venous Disease; Phlebitis; Vasculitis Past Medical History Notes: ischemic cardiomyopathy Gastrointestinal Medical History: Negative for: Cirrhosis ; Colitis; Crohns; Hepatitis A; Hepatitis B; Hepatitis C Endocrine Medical History: Negative for: Type I Diabetes; Type II Diabetes Genitourinary Medical History: Positive for: End Stage Renal Disease - on dialysis Hurley Th, Sat. , Immunological Medical History: Negative for: Lupus Erythematosus; Raynauds; Scleroderma Integumentary (Skin) Medical History: Negative for: History of Burn Musculoskeletal Medical History: Negative for: Gout; Rheumatoid Arthritis; Osteoarthritis; Osteomyelitis Neurologic Medical History: Negative for: Dementia; Neuropathy; Quadriplegia; Paraplegia; Seizure Disorder Oncologic Medical History: Negative for: Received Chemotherapy; Received Radiation Immunizations Pneumococcal Vaccine: Received Pneumococcal Vaccination: Yes Implantable Devices Yes Hospitalization / Surgery History Type of Hospitalization/Surgery pancreatitis Family and Social History Unknown History: Yes; Never smoker; Marital Status - Married; Alcohol Use: Moderate; Drug Use: No History; Caffeine Use: Never; Financial Concerns: No; Food, Clothing or Shelter Needs: No; Support System Lacking: No; Transportation Concerns: No Electronic Signature(s) Signed: 02/16/2021 2:20:04 PM By: Raymond Shan DO Signed: 02/16/2021 5:09:19 PM By: Raymond Hurley Entered By: Raymond Hurley on 02/16/2021 14:15:35 -------------------------------------------------------------------------------- SuperBill Details Patient Name: Date of Service: Raymond Banks MPSO N G. 02/16/2021 Medical Record Number: [id number] Patient Account Number: [id number] Date of Birth/Sex: Treating RN: 16-Apr-1931 (87 y.o. Raymond Hurley Primary Care Provider: Patrina Hurley Other Clinician: Referring Provider: Treating Provider/Extender: Raymond Lukes  Hurley Weeks in Treatment: 5 Diagnosis Coding ICD-10 Codes Code Description [id number] Pressure ulcer of sacral region, stage 3 K85.81 Other acute pancreatitis with uninfected necrosis E43 Unspecified severe protein-calorie malnutrition Facility Procedures CPT4 Code: [id number] Description: [phone] - WOUND CARE VISIT-LEV 2 EST PT Modifier: Quantity: 1 Physician Procedures : CPT4 Code Description Modifier [id number] - WC PHYS LEVEL 3 - EST PT ICD-10 Diagnosis Description L89.153 Pressure ulcer of sacral region, stage 3 Quantity: 1 Electronic Signature(s) Signed: 02/16/2021 2:20:04 PM By: Raymond Shan DO Entered By: Raymond Hurley on 02/16/2021 14:19:44

## 2021-02-16 NOTE — Progress Notes (Addendum)
Raymond, Hurley ([id number]) Visit Report for 02/16/2021 Arrival Information Details Patient Name: Date of Service: Raymond Hurley 02/16/2021 1:45 PM Medical Record Number: [id number] Patient Account Number: [id number] Date of Birth/Sex: Treating RN: 07-30-1931 (87 y.o. Raymond Hurley Primary Care Eliabeth Shoff: [name] Other Clinician: Referring [name]: Treating [name]/Extender: Cleatrice Burke Weeks in Treatment: 5 Visit Information History Since Last Visit Added or deleted any medications: No Patient Arrived: Wheel Chair Any new allergies or adverse reactions: No Arrival Time: 13:41 Had a fall or experienced change in No Accompanied By: wife activities of daily living that may affect Transfer Assistance: None risk of falls: Patient Identification Verified: Yes Signs or symptoms of abuse/neglect since last visito No Secondary Verification Process Completed: Yes Hospitalized since last visit: No Patient Requires Transmission-Based Precautions: No Implantable device outside of the clinic excluding No Patient Has Alerts: No cellular tissue based products placed in the center since last visit: Has Dressing in Place as Prescribed: Yes Pain Present Now: No Electronic Signature(s) Signed: 02/16/2021 4:45:41 PM By: [name] Entered By: [name] on 02/16/2021 13:43:05 -------------------------------------------------------------------------------- Clinic Level of Care Assessment Details Patient Name: Date of Service: Raymond Hurley 02/16/2021 1:45 PM Medical Record Number: [id number] Patient Account Number: [id number] Date of Birth/Sex: Treating RN: April 02, 1931 (87 y.o. Raymond Hurley Primary Care [name]: [name] Other Clinician: Referring [name]: Treating [name]/Extender: [name] Weeks in Treatment: 5 Clinic Level of Care Assessment Items TOOL 4 Quantity Score X- 1 0 Use when  only an EandM is performed on FOLLOW-UP visit ASSESSMENTS - Nursing Assessment / Reassessment X- 1 10 Reassessment of Co-morbidities (includes updates in patient status) X- 1 5 Reassessment of Adherence to Treatment Plan ASSESSMENTS - Wound and Skin A ssessment / Reassessment X - Simple Wound Assessment / Reassessment - one wound 1 5 []  - 0 Complex Wound Assessment / Reassessment - multiple wounds []  - 0 Dermatologic / Skin Assessment (not related to wound area) ASSESSMENTS - Focused Assessment []  - 0 Circumferential Edema Measurements - multi extremities []  - 0 Nutritional Assessment / Counseling / Intervention []  - 0 Lower Extremity Assessment (monofilament, tuning fork, pulses) []  - 0 Peripheral Arterial Disease Assessment (using hand held doppler) ASSESSMENTS - Ostomy and/or Continence Assessment and Care []  - 0 Incontinence Assessment and Management []  - 0 Ostomy Care Assessment and Management (repouching, etc.) PROCESS - Coordination of Care []  - 0 Simple Patient / Family Education for ongoing care X- 1 20 Complex (extensive) Patient / Family Education for ongoing care []  - 0 Staff obtains Programmer, systems, Records, T Results / Process Orders est X- 1 10 Staff telephones HHA, Nursing Homes / Clarify orders / etc []  - 0 Routine Transfer to another Facility (non-emergent condition) []  - 0 Routine Hospital Admission (non-emergent condition) []  - 0 New Admissions / Biomedical engineer / Ordering NPWT Apligraf, etc. , []  - 0 Emergency Hospital Admission (emergent condition) []  - 0 Simple Discharge Coordination []  - 0 Complex (extensive) Discharge Coordination PROCESS - Special Needs []  - 0 Pediatric / Minor Patient Management []  - 0 Isolation Patient Management []  - 0 Hearing / Language / Visual special needs []  - 0 Assessment of Community assistance (transportation, D/C planning, etc.) []  - 0 Additional assistance / Altered mentation []  - 0 Support  Surface(s) Assessment (bed, cushion, seat, etc.) INTERVENTIONS - Wound Cleansing / Measurement []  - 0 Simple Wound Cleansing - one wound []  - 0 Complex  Wound Cleansing - multiple wounds X- 1 5 Wound Imaging (photographs - any number of wounds) []  - 0 Wound Tracing (instead of photographs) []  - 0 Simple Wound Measurement - one wound []  - 0 Complex Wound Measurement - multiple wounds INTERVENTIONS - Wound Dressings []  - 0 Small Wound Dressing one or multiple wounds []  - 0 Medium Wound Dressing one or multiple wounds []  - 0 Large Wound Dressing one or multiple wounds X- 1 5 Application of Medications - topical []  - 0 Application of Medications - injection INTERVENTIONS - Miscellaneous []  - 0 External ear exam []  - 0 Specimen Collection (cultures, biopsies, blood, body fluids, etc.) []  - 0 Specimen(s) / Culture(s) sent or taken to Lab for analysis []  - 0 Patient Transfer (multiple staff / Civil Service fast streamer / Similar devices) []  - 0 Simple Staple / Suture removal (25 or less) []  - 0 Complex Staple / Suture removal (26 or more) []  - 0 Hypo / Hyperglycemic Management (close monitor of Blood Glucose) []  - 0 Ankle / Brachial Index (ABI) - do not check if billed separately X- 1 5 Vital Signs Has the patient been seen at the hospital within the last three years: Yes Total Score: 65 Level Of Care: New/Established - Level 2 Electronic Signature(s) Signed: 02/16/2021 5:09:19 PM By: Lorrin Jackson Entered By: Lorrin Jackson on 02/16/2021 14:07:52 -------------------------------------------------------------------------------- Encounter Discharge Information Details Patient Name: Date of Service: Raymond Hurley [facility]PSO N G. 02/16/2021 1:45 PM Medical Record Number: [id number] Patient Account Number: [id number] Date of Birth/Sex: Treating RN: [birth year] (87 y.o. [name] Primary Care Bence Trapp: [name] Other Clinician: Referring Bill Yohn: Treating [name]/Extender: [name] in Treatment: 5 Encounter Discharge Information Items Discharge Condition: Stable Ambulatory Status: Wheelchair Discharge Destination: Home Transportation: Private Auto Accompanied By: self Schedule Follow-up Appointment: Yes Clinical Summary of Care: Electronic Signature(s) Signed: 02/16/2021 5:05:26 PM By: Deon Pilling Entered By: Deon Pilling on 02/16/2021 17:02:42 -------------------------------------------------------------------------------- Lower Extremity Assessment Details Patient Name: Date of Service: Raymond Hurley 02/16/2021 1:45 PM Medical Record Number: [id number] Patient Account Number: [id number] Date of Birth/Sex: Treating RN: January 25, 1931 (87 y.o. [name] Primary Care Jerah Esty: [name] Other Clinician: Referring Nole Robey: Treating Marsh Heckler/Extender: [name] Weeks in Treatment: 5 Electronic Signature(s) Signed: 02/16/2021 5:05:26 PM By: Deon Pilling Entered By: Deon Pilling on 02/16/2021 13:51:17 -------------------------------------------------------------------------------- Multi Wound Chart Details Patient Name: Date of Service: Raymond Hurley MPSO N G. 02/16/2021 1:45 PM Medical Record Number: [id number] Patient Account Number: [id number] Date of Birth/Sex: Treating RN: Mar 10, 1931 (87 y.o. Raymond Hurley Primary Care Ashon Rosenberg: [name] Other Clinician: Referring Alphonsine Minium: Treating Brandolyn Shortridge/Extender: [name] Weeks in Treatment: 5 Vital Signs Height(in): Pulse(bpm): 76 Weight(lbs): Blood Pressure(mmHg): 122/78 Body Mass Index(BMI): Temperature(F): 98.0 Respiratory Rate(breaths/min): 17 Photos: [1:No Photos Sacrum] [N/A:N/A N/A] Wound Location: [1:Gradually Appeared] [N/A:N/A] Wounding Event: [1:Pressure Ulcer] [N/A:N/A] Primary Etiology: [1:10/14/2020] [N/A:N/A] Date Acquired: [1:5] [N/A:N/A] Weeks of Treatment: [1:Open]  [N/A:N/A] Wound Status: [1:0x0x0] [N/A:N/A] Measurements L x W x D (cm) [1:0] [N/A:N/A] A (cm) : rea [1:0] [N/A:N/A] Volume (cm) : [1:100.00%] [N/A:N/A] % Reduction in A rea: [1:100.00%] [N/A:N/A] % Reduction in Volume: [1:Category/Stage III] [N/A:N/A] Treatment Notes Electronic Signature(s) Signed: 02/16/2021 2:20:04 PM By: Kalman Shan DO Signed: 02/16/2021 5:09:19 PM By: Lorrin Jackson Entered By: Kalman Shan on 02/16/2021 14:14:01 -------------------------------------------------------------------------------- Multi-Disciplinary Care Plan Details Patient Name: Date of Service: Raymond Hurley MPSO N G. 02/16/2021 1:45 PM Medical  Record Number: [id number] Patient Account Number: [id number] Date of Birth/Sex: Treating RN: Dec 05, 1930 (87 y.o. Raymond Hurley Primary Care Cartel Mauss: [name] Other Clinician: Referring Mikaylah Libbey: Treating Marlos Carmen/Extender: [name] in Treatment: Abram reviewed with physician Active Inactive Electronic Signature(s) Signed: 02/16/2021 5:09:19 PM By: Lorrin Jackson Previous Signature: 02/16/2021 1:34:19 PM Version By: Lorrin Jackson Entered By: Lorrin Jackson on 02/16/2021 14:06:40 -------------------------------------------------------------------------------- Pain Assessment Details Patient Name: Date of Service: Raymond Hurley MPSO N G. 02/16/2021 1:45 PM Medical Record Number: [id number] Patient Account Number: [id number] Date of Birth/Sex: Treating RN: 1931/07/08 (87 y.o. Raymond Hurley Primary Care Bartt Gonzaga: [name] Other Clinician: Referring Amazin Pincock: Treating Maiko Salais/Extender: [name] Weeks in Treatment: 5 Active Problems Location of Pain Severity and Description of Pain Patient Has Paino No Site Locations Pain Management and Medication Current Pain Management: Electronic Signature(s) Signed: 02/16/2021 4:45:41 PM By: [name] Signed: 02/16/2021 5:09:19 PM By: Lorrin Jackson Entered By: [name] on 02/16/2021 13:43:32 -------------------------------------------------------------------------------- Patient/Caregiver Education Details Patient Name: Date of Service: DA Clemencia Course 5/6/2022andnbsp1:45 PM Medical Record Number: [id number] Patient Account Number: [id number] Date of Birth/Gender: Treating RN: 1931-06-05 (87 y.o. Raymond Hurley Primary Care Physician: [name] Other Clinician: Referring Physician: Treating Physician/Extender: [name] in Treatment: 5 Education Assessment Education Provided To: Patient Education Topics Provided Pressure: Methods: Explain/Verbal, Printed Responses: State content correctly Notes Healed, discharge instructions given. Electronic Signature(s) Signed: 02/16/2021 5:09:19 PM By: Lorrin Jackson Entered By: Lorrin Jackson on 02/16/2021 14:07:09 -------------------------------------------------------------------------------- Wound Assessment Details Patient Name: Date of Service: Raymond Hurley 02/16/2021 1:45 PM Medical Record Number: [id number] Patient Account Number: [id number] Date of Birth/Sex: Treating RN: 11/03/1930 (87 y.o. Raymond Hurley Primary Care Harlem Bula: [name] Other Clinician: Referring Renesmae Donahey: Treating Avanell Banwart/Extender: [name] Weeks in Treatment: 5 Wound Status Wound Number: 1 Primary Etiology: Pressure Ulcer Wound Location: Sacrum Wound Status: Healed - Epithelialized Wounding Event: Gradually Appeared Comorbid Anemia, Arrhythmia, Hypotension, End Stage Renal History: Disease Date Acquired: 10/14/2020 Weeks Of Treatment: 5 Clustered Wound: No Photos Wound Measurements Length: (cm) Width: (cm) Depth: (cm) Area: (cm) Volume: (cm) 0 % Reduction in Area: 100% 0 % Reduction in Volume: 100% 0 Epithelialization: Small  (1-33%) 0 0 Wound Description Classification: Category/Stage III Wound Margin: Distinct, outline attached Exudate Amount: Medium Exudate Type: Serosanguineous Exudate Color: red, brown Foul Odor After Cleansing: No Slough/Fibrino No Wound Bed Granulation Amount: Large (67-100%) Exposed Structure Granulation Quality: Red, Pink Fascia Exposed: No Necrotic Amount: None Present (0%) Fat Layer (Subcutaneous Tissue) Exposed: No Tendon Exposed: No Muscle Exposed: No Joint Exposed: No Bone Exposed: No Electronic Signature(s) Signed: 02/16/2021 4:45:41 PM By: [name] Signed: 02/16/2021 5:09:19 PM By: Lorrin Jackson Entered By: [name] on 02/16/2021 16:31:25 -------------------------------------------------------------------------------- Vitals Details Patient Name: Date of Service: [name] 02/16/2021 1:45 PM Medical Record Number: [id number] Patient Account Number: [id number] Date of Birth/Sex: Treating RN: 15-Nov-1930 (87 y.o. Raymond Hurley Primary Care Sapphire Tygart: [name] Other Clinician: Referring Daryl Quiros: Treating Oskar Cretella/Extender: [name] Weeks in Treatment: 5 Vital Signs Time Taken: 13:43 Temperature (F): 98.0 Pulse (bpm): 76 Respiratory Rate (breaths/min): 17 Blood Pressure (mmHg): 122/78 Reference Range: 80 - 120 mg / dl Electronic Signature(s) Signed: 02/16/2021 4:45:41 PM By: [name] Entered By: [name] on 02/16/2021 13:43:24

## 2021-02-17 DIAGNOSIS — D509 Iron deficiency anemia, unspecified: Secondary | ICD-10-CM | POA: Diagnosis not present

## 2021-02-17 DIAGNOSIS — N186 End stage renal disease: Secondary | ICD-10-CM | POA: Diagnosis not present

## 2021-02-17 DIAGNOSIS — D631 Anemia in chronic kidney disease: Secondary | ICD-10-CM | POA: Diagnosis not present

## 2021-02-17 DIAGNOSIS — N2581 Secondary hyperparathyroidism of renal origin: Secondary | ICD-10-CM | POA: Diagnosis not present

## 2021-02-17 DIAGNOSIS — Z992 Dependence on renal dialysis: Secondary | ICD-10-CM | POA: Diagnosis not present

## 2021-02-19 DIAGNOSIS — L89313 Pressure ulcer of right buttock, stage 3: Secondary | ICD-10-CM | POA: Diagnosis not present

## 2021-02-19 DIAGNOSIS — I4891 Unspecified atrial fibrillation: Secondary | ICD-10-CM | POA: Diagnosis not present

## 2021-02-19 DIAGNOSIS — D631 Anemia in chronic kidney disease: Secondary | ICD-10-CM | POA: Diagnosis not present

## 2021-02-19 DIAGNOSIS — I5022 Chronic systolic (congestive) heart failure: Secondary | ICD-10-CM | POA: Diagnosis not present

## 2021-02-19 DIAGNOSIS — N186 End stage renal disease: Secondary | ICD-10-CM | POA: Diagnosis not present

## 2021-02-19 DIAGNOSIS — I2510 Atherosclerotic heart disease of native coronary artery without angina pectoris: Secondary | ICD-10-CM | POA: Diagnosis not present

## 2021-02-20 DIAGNOSIS — N186 End stage renal disease: Secondary | ICD-10-CM | POA: Diagnosis not present

## 2021-02-20 DIAGNOSIS — Z992 Dependence on renal dialysis: Secondary | ICD-10-CM | POA: Diagnosis not present

## 2021-02-20 DIAGNOSIS — D509 Iron deficiency anemia, unspecified: Secondary | ICD-10-CM | POA: Diagnosis not present

## 2021-02-20 DIAGNOSIS — N2581 Secondary hyperparathyroidism of renal origin: Secondary | ICD-10-CM | POA: Diagnosis not present

## 2021-02-20 DIAGNOSIS — D631 Anemia in chronic kidney disease: Secondary | ICD-10-CM | POA: Diagnosis not present

## 2021-02-21 ENCOUNTER — Other Ambulatory Visit: Payer: Self-pay | Admitting: Gastroenterology

## 2021-02-21 ENCOUNTER — Other Ambulatory Visit (HOSPITAL_COMMUNITY): Payer: Self-pay | Admitting: Gastroenterology

## 2021-02-21 DIAGNOSIS — R140 Abdominal distension (gaseous): Secondary | ICD-10-CM | POA: Diagnosis not present

## 2021-02-21 DIAGNOSIS — Z86711 Personal history of pulmonary embolism: Secondary | ICD-10-CM | POA: Diagnosis not present

## 2021-02-21 DIAGNOSIS — R110 Nausea: Secondary | ICD-10-CM | POA: Diagnosis not present

## 2021-02-21 DIAGNOSIS — L89313 Pressure ulcer of right buttock, stage 3: Secondary | ICD-10-CM | POA: Diagnosis not present

## 2021-02-21 DIAGNOSIS — D631 Anemia in chronic kidney disease: Secondary | ICD-10-CM | POA: Diagnosis not present

## 2021-02-21 DIAGNOSIS — I4891 Unspecified atrial fibrillation: Secondary | ICD-10-CM | POA: Diagnosis not present

## 2021-02-21 DIAGNOSIS — K863 Pseudocyst of pancreas: Secondary | ICD-10-CM | POA: Diagnosis not present

## 2021-02-21 DIAGNOSIS — I5022 Chronic systolic (congestive) heart failure: Secondary | ICD-10-CM | POA: Diagnosis not present

## 2021-02-21 DIAGNOSIS — I2510 Atherosclerotic heart disease of native coronary artery without angina pectoris: Secondary | ICD-10-CM | POA: Diagnosis not present

## 2021-02-21 DIAGNOSIS — N186 End stage renal disease: Secondary | ICD-10-CM | POA: Diagnosis not present

## 2021-02-21 DIAGNOSIS — Z8679 Personal history of other diseases of the circulatory system: Secondary | ICD-10-CM | POA: Diagnosis not present

## 2021-02-21 DIAGNOSIS — R9389 Abnormal findings on diagnostic imaging of other specified body structures: Secondary | ICD-10-CM | POA: Diagnosis not present

## 2021-02-21 DIAGNOSIS — U071 COVID-19: Secondary | ICD-10-CM | POA: Diagnosis not present

## 2021-02-21 NOTE — Progress Notes (Signed)
HPI:  FU atrial flutter and CM. Previously seen and noted to have tachycardia. He was therefore referred for an echocardiogram. EchoJune 2020 showed ejection fraction 25 to 30%, mild biatrial enlargement, mild mitral and tricuspid regurgitation. Cardiac catheterization July 2020 showed a 20% circumflex lesion, right atrial pressure of 14 and pulmonary capillary wedge pressure 8. Medical therapy recommended.Pt then seen in [place] and found to be in atrial flutter. Echocardiogram showed severe LV dysfunction. He also apparently had ventricular tachycardia for 10 minutes though I do not have those results available and apparently he was asymptomatic. Cardioversion was recommended but patient left AGAINST MEDICAL ADVICE.When I first evaluated the patient on8/20he noted increased dyspnea on exertion since April. We arranged TEE guided cardioversion which was performed June 02, 2019. TEE showed ejection fraction 20 to 25%, mild left atrial and right atrial enlargement and cardioversion was successful.   Patient developed COVID January 2022 associated with pancreatitis and acute renal failure requiring dialysis.  He had recurrent atrial fibrillation and was placed on amiodarone.  Carotid Dopplers January 2022 near normal bilaterally.  Echocardiogram January 2022 showed normal LV function, mild biatrial enlargement.  Patient apparently subsequently found to have a pulmonary embolus by CT scan April 2022.  CTA April 2022 showed pulmonary embolus in the right main pulmonary artery, clot adherent to the right central venous catheter, extensive peripancreatic collection favored to be sequela of pancreatitis, retroperitoneal fluid collection.  His anticoagulation had been discontinued previously due to GI bleed.  He declined resuming anticoagulation and also IVC filter.  Since last seen  patient is slowly regaining his strength from recent COVID infection.  He is also on dialysis now.  He denies  dyspnea, chest pain, palpitations or syncope.  No pedal edema.  Current Outpatient Medications  Medication Sig Dispense Refill  . amiodarone (PACERONE) 200 MG tablet Take one pill twice a day till 02/25. Starting on 02/26 decrease to one pill daily. 35 tablet 0  . docusate sodium (COLACE) 100 MG capsule Take 100 mg by mouth 2 (two) times daily.    Marland Kitchen lactobacillus acidophilus & bulgar (LACTINEX) chewable tablet Chew 1 tablet by mouth 3 (three) times daily with meals. 90 tablet 0  . midodrine (PROAMATINE) 10 MG tablet Take 1 tablet (10 mg total) by mouth 3 (three) times daily. 90 tablet 0  . Nutritional Supplements (,FEEDING SUPPLEMENT, PROSOURCE PLUS) liquid Take 30 mLs by mouth 2 (two) times daily between meals. 887 mL 0  . pantoprazole (PROTONIX) 40 MG tablet Take 1 tablet by mouth every morning.     No current facility-administered medications for this visit.     Past Medical History:  Diagnosis Date  . Acute pancreatitis   . Atrial fibrillation ([place])   . CAD (coronary artery disease) 04/2019  . CHF (congestive heart failure) ([place])   . Melanoma ([place])   . Vitamin B 12 deficiency     Past Surgical History:  Procedure Laterality Date  . CARDIOVERSION N/A 06/02/2019   Procedure: CARDIOVERSION;  Surgeon: Jerline Pain, MD;  Location: [facility] ENDOSCOPY;  Service: Cardiovascular;  Laterality: N/A;  . IR FLUORO GUIDE CV LINE RIGHT  11/06/2020  . IR US GUIDE VASC ACCESS RIGHT  11/06/2020  . RIGHT/LEFT HEART CATH AND CORONARY ANGIOGRAPHY N/A 04/27/2019   Procedure: RIGHT/LEFT HEART CATH AND CORONARY ANGIOGRAPHY;  Surgeon: Jolaine Artist, MD;  Location: [facility] CV LAB;  Service: Cardiovascular;  Laterality: N/A;  . TEE WITHOUT CARDIOVERSION N/A 06/02/2019   Procedure:  TRANSESOPHAGEAL ECHOCARDIOGRAM (TEE);  Surgeon: Jerline Pain, MD;  Location: [facility] ENDOSCOPY;  Service: Cardiovascular;  Laterality: N/A;    Social History   Socioeconomic History  . Marital status: Married    Spouse  name: Not on file  . Number of children: Not on file  . Years of education: Not on file  . Highest education level: Not on file  Occupational History  . Not on file  Tobacco Use  . Smoking status: Never Smoker  . Smokeless tobacco: Never Used  Vaping Use  . Vaping Use: Never used  Substance and Sexual Activity  . Alcohol use: Yes    Alcohol/week: 7.0 standard drinks    Types: 7 Glasses of wine per week  . Drug use: Never  . Sexual activity: Not on file  Other Topics Concern  . Not on file  Social History Narrative  . Not on file   Social Determinants of Health   Financial Resource Strain: Not on file  Food Insecurity: Not on file  Transportation Needs: Not on file  Physical Activity: Not on file  Stress: Not on file  Social Connections: Not on file  Intimate Partner Violence: Not on file    Family History  Problem Relation Age of Onset  . Melanoma Daughter     ROS: Generalized weakness but no fevers or chills, productive cough, hemoptysis, dysphasia, odynophagia, melena, hematochezia, dysuria, hematuria, rash, seizure activity, orthopnea, PND, pedal edema, claudication. Remaining systems are negative.  Physical Exam: Well-developed well-nourished in no acute distress.  Skin is warm and dry.  HEENT is normal.  Neck is supple.  Chest is clear to auscultation with normal expansion.  Cardiovascular exam is regular rate and rhythm.  Abdominal exam nontender or distended. No masses palpated. Extremities show no edema. neuro grossly intact  A/P  1 history of nonischemic cardiomyopathy-this was felt to be tachycardia mediated.  Most recent echocardiogram showed normalization of LV function.  He is now on dialysis and blood pressure runs low and therefore his Entresto and beta-blocker have been discontinued.  2 paroxysmal atrial fibrillation/flutter-patient remains in sinus rhythm.  Continue amiodarone.  Apixaban was discontinued previously due to GI bleeding at the time  of his COVID infection.  3 chronic combined systolic/diastolic congestive heart failure-volume now removed with dialysis.  4 end-stage renal disease-now dialysis dependent following COVID infection.  Per nephrology.  5 recent pulmonary embolus-possibly secondary to dialysis catheter.  We discussed resuming anticoagulation but they declined.  Kirk Ruths, MD

## 2021-02-22 DIAGNOSIS — D631 Anemia in chronic kidney disease: Secondary | ICD-10-CM | POA: Diagnosis not present

## 2021-02-22 DIAGNOSIS — Z992 Dependence on renal dialysis: Secondary | ICD-10-CM | POA: Diagnosis not present

## 2021-02-22 DIAGNOSIS — N2581 Secondary hyperparathyroidism of renal origin: Secondary | ICD-10-CM | POA: Diagnosis not present

## 2021-02-22 DIAGNOSIS — D509 Iron deficiency anemia, unspecified: Secondary | ICD-10-CM | POA: Diagnosis not present

## 2021-02-22 DIAGNOSIS — N186 End stage renal disease: Secondary | ICD-10-CM | POA: Diagnosis not present

## 2021-02-24 DIAGNOSIS — N2581 Secondary hyperparathyroidism of renal origin: Secondary | ICD-10-CM | POA: Diagnosis not present

## 2021-02-24 DIAGNOSIS — Z992 Dependence on renal dialysis: Secondary | ICD-10-CM | POA: Diagnosis not present

## 2021-02-24 DIAGNOSIS — D509 Iron deficiency anemia, unspecified: Secondary | ICD-10-CM | POA: Diagnosis not present

## 2021-02-24 DIAGNOSIS — D631 Anemia in chronic kidney disease: Secondary | ICD-10-CM | POA: Diagnosis not present

## 2021-02-24 DIAGNOSIS — N186 End stage renal disease: Secondary | ICD-10-CM | POA: Diagnosis not present

## 2021-02-27 DIAGNOSIS — Z992 Dependence on renal dialysis: Secondary | ICD-10-CM | POA: Diagnosis not present

## 2021-02-27 DIAGNOSIS — N2581 Secondary hyperparathyroidism of renal origin: Secondary | ICD-10-CM | POA: Diagnosis not present

## 2021-02-27 DIAGNOSIS — D509 Iron deficiency anemia, unspecified: Secondary | ICD-10-CM | POA: Diagnosis not present

## 2021-02-27 DIAGNOSIS — N186 End stage renal disease: Secondary | ICD-10-CM | POA: Diagnosis not present

## 2021-02-27 DIAGNOSIS — D631 Anemia in chronic kidney disease: Secondary | ICD-10-CM | POA: Diagnosis not present

## 2021-02-28 DIAGNOSIS — L89313 Pressure ulcer of right buttock, stage 3: Secondary | ICD-10-CM | POA: Diagnosis not present

## 2021-02-28 DIAGNOSIS — I5022 Chronic systolic (congestive) heart failure: Secondary | ICD-10-CM | POA: Diagnosis not present

## 2021-02-28 DIAGNOSIS — I4891 Unspecified atrial fibrillation: Secondary | ICD-10-CM | POA: Diagnosis not present

## 2021-02-28 DIAGNOSIS — D631 Anemia in chronic kidney disease: Secondary | ICD-10-CM | POA: Diagnosis not present

## 2021-02-28 DIAGNOSIS — Z23 Encounter for immunization: Secondary | ICD-10-CM | POA: Diagnosis not present

## 2021-02-28 DIAGNOSIS — I2510 Atherosclerotic heart disease of native coronary artery without angina pectoris: Secondary | ICD-10-CM | POA: Diagnosis not present

## 2021-02-28 DIAGNOSIS — N186 End stage renal disease: Secondary | ICD-10-CM | POA: Diagnosis not present

## 2021-03-01 DIAGNOSIS — N2581 Secondary hyperparathyroidism of renal origin: Secondary | ICD-10-CM | POA: Diagnosis not present

## 2021-03-01 DIAGNOSIS — N186 End stage renal disease: Secondary | ICD-10-CM | POA: Diagnosis not present

## 2021-03-01 DIAGNOSIS — Z992 Dependence on renal dialysis: Secondary | ICD-10-CM | POA: Diagnosis not present

## 2021-03-01 DIAGNOSIS — D509 Iron deficiency anemia, unspecified: Secondary | ICD-10-CM | POA: Diagnosis not present

## 2021-03-01 DIAGNOSIS — D631 Anemia in chronic kidney disease: Secondary | ICD-10-CM | POA: Diagnosis not present

## 2021-03-02 ENCOUNTER — Encounter: Payer: Self-pay | Admitting: Cardiology

## 2021-03-02 ENCOUNTER — Other Ambulatory Visit: Payer: Self-pay

## 2021-03-02 ENCOUNTER — Ambulatory Visit (INDEPENDENT_AMBULATORY_CARE_PROVIDER_SITE_OTHER): Payer: Medicare Other | Admitting: Cardiology

## 2021-03-02 VITALS — BP 100/60 | HR 80 | Ht 69.0 in | Wt 160.0 lb

## 2021-03-02 DIAGNOSIS — I428 Other cardiomyopathies: Secondary | ICD-10-CM

## 2021-03-02 DIAGNOSIS — N186 End stage renal disease: Secondary | ICD-10-CM | POA: Diagnosis not present

## 2021-03-02 DIAGNOSIS — I5022 Chronic systolic (congestive) heart failure: Secondary | ICD-10-CM

## 2021-03-02 DIAGNOSIS — I480 Paroxysmal atrial fibrillation: Secondary | ICD-10-CM

## 2021-03-02 DIAGNOSIS — L89313 Pressure ulcer of right buttock, stage 3: Secondary | ICD-10-CM | POA: Diagnosis not present

## 2021-03-02 DIAGNOSIS — I4891 Unspecified atrial fibrillation: Secondary | ICD-10-CM | POA: Diagnosis not present

## 2021-03-02 DIAGNOSIS — I2510 Atherosclerotic heart disease of native coronary artery without angina pectoris: Secondary | ICD-10-CM | POA: Diagnosis not present

## 2021-03-02 DIAGNOSIS — D631 Anemia in chronic kidney disease: Secondary | ICD-10-CM | POA: Diagnosis not present

## 2021-03-02 NOTE — Patient Instructions (Signed)

## 2021-03-03 DIAGNOSIS — N186 End stage renal disease: Secondary | ICD-10-CM | POA: Diagnosis not present

## 2021-03-03 DIAGNOSIS — D509 Iron deficiency anemia, unspecified: Secondary | ICD-10-CM | POA: Diagnosis not present

## 2021-03-03 DIAGNOSIS — D631 Anemia in chronic kidney disease: Secondary | ICD-10-CM | POA: Diagnosis not present

## 2021-03-03 DIAGNOSIS — N2581 Secondary hyperparathyroidism of renal origin: Secondary | ICD-10-CM | POA: Diagnosis not present

## 2021-03-03 DIAGNOSIS — Z992 Dependence on renal dialysis: Secondary | ICD-10-CM | POA: Diagnosis not present

## 2021-03-05 DIAGNOSIS — N186 End stage renal disease: Secondary | ICD-10-CM | POA: Diagnosis not present

## 2021-03-05 DIAGNOSIS — D631 Anemia in chronic kidney disease: Secondary | ICD-10-CM | POA: Diagnosis not present

## 2021-03-05 DIAGNOSIS — I4891 Unspecified atrial fibrillation: Secondary | ICD-10-CM | POA: Diagnosis not present

## 2021-03-05 DIAGNOSIS — Z23 Encounter for immunization: Secondary | ICD-10-CM | POA: Diagnosis not present

## 2021-03-05 DIAGNOSIS — I483 Typical atrial flutter: Secondary | ICD-10-CM | POA: Diagnosis not present

## 2021-03-05 DIAGNOSIS — R110 Nausea: Secondary | ICD-10-CM | POA: Diagnosis not present

## 2021-03-05 DIAGNOSIS — D692 Other nonthrombocytopenic purpura: Secondary | ICD-10-CM | POA: Diagnosis not present

## 2021-03-05 DIAGNOSIS — Z79899 Other long term (current) drug therapy: Secondary | ICD-10-CM | POA: Diagnosis not present

## 2021-03-05 DIAGNOSIS — I2510 Atherosclerotic heart disease of native coronary artery without angina pectoris: Secondary | ICD-10-CM | POA: Diagnosis not present

## 2021-03-05 DIAGNOSIS — Z0000 Encounter for general adult medical examination without abnormal findings: Secondary | ICD-10-CM | POA: Diagnosis not present

## 2021-03-05 DIAGNOSIS — I953 Hypotension of hemodialysis: Secondary | ICD-10-CM | POA: Diagnosis not present

## 2021-03-05 DIAGNOSIS — L89313 Pressure ulcer of right buttock, stage 3: Secondary | ICD-10-CM | POA: Diagnosis not present

## 2021-03-05 DIAGNOSIS — D7589 Other specified diseases of blood and blood-forming organs: Secondary | ICD-10-CM | POA: Diagnosis not present

## 2021-03-05 DIAGNOSIS — I5022 Chronic systolic (congestive) heart failure: Secondary | ICD-10-CM | POA: Diagnosis not present

## 2021-03-06 DIAGNOSIS — D509 Iron deficiency anemia, unspecified: Secondary | ICD-10-CM | POA: Diagnosis not present

## 2021-03-06 DIAGNOSIS — N186 End stage renal disease: Secondary | ICD-10-CM | POA: Diagnosis not present

## 2021-03-06 DIAGNOSIS — Z992 Dependence on renal dialysis: Secondary | ICD-10-CM | POA: Diagnosis not present

## 2021-03-06 DIAGNOSIS — N2581 Secondary hyperparathyroidism of renal origin: Secondary | ICD-10-CM | POA: Diagnosis not present

## 2021-03-06 DIAGNOSIS — D631 Anemia in chronic kidney disease: Secondary | ICD-10-CM | POA: Diagnosis not present

## 2021-03-07 DIAGNOSIS — D631 Anemia in chronic kidney disease: Secondary | ICD-10-CM | POA: Diagnosis not present

## 2021-03-07 DIAGNOSIS — L89313 Pressure ulcer of right buttock, stage 3: Secondary | ICD-10-CM | POA: Diagnosis not present

## 2021-03-07 DIAGNOSIS — I2510 Atherosclerotic heart disease of native coronary artery without angina pectoris: Secondary | ICD-10-CM | POA: Diagnosis not present

## 2021-03-07 DIAGNOSIS — I5022 Chronic systolic (congestive) heart failure: Secondary | ICD-10-CM | POA: Diagnosis not present

## 2021-03-07 DIAGNOSIS — I4891 Unspecified atrial fibrillation: Secondary | ICD-10-CM | POA: Diagnosis not present

## 2021-03-07 DIAGNOSIS — N186 End stage renal disease: Secondary | ICD-10-CM | POA: Diagnosis not present

## 2021-03-08 DIAGNOSIS — R471 Dysarthria and anarthria: Secondary | ICD-10-CM | POA: Diagnosis not present

## 2021-03-08 DIAGNOSIS — Z79899 Other long term (current) drug therapy: Secondary | ICD-10-CM | POA: Diagnosis not present

## 2021-03-08 DIAGNOSIS — D631 Anemia in chronic kidney disease: Secondary | ICD-10-CM | POA: Diagnosis not present

## 2021-03-08 DIAGNOSIS — R1310 Dysphagia, unspecified: Secondary | ICD-10-CM | POA: Diagnosis not present

## 2021-03-08 DIAGNOSIS — Z9181 History of falling: Secondary | ICD-10-CM | POA: Diagnosis not present

## 2021-03-08 DIAGNOSIS — K5901 Slow transit constipation: Secondary | ICD-10-CM | POA: Diagnosis not present

## 2021-03-08 DIAGNOSIS — Z8616 Personal history of COVID-19: Secondary | ICD-10-CM | POA: Diagnosis not present

## 2021-03-08 DIAGNOSIS — L89313 Pressure ulcer of right buttock, stage 3: Secondary | ICD-10-CM | POA: Diagnosis not present

## 2021-03-08 DIAGNOSIS — G9340 Encephalopathy, unspecified: Secondary | ICD-10-CM | POA: Diagnosis not present

## 2021-03-08 DIAGNOSIS — Z85820 Personal history of malignant melanoma of skin: Secondary | ICD-10-CM | POA: Diagnosis not present

## 2021-03-08 DIAGNOSIS — I4891 Unspecified atrial fibrillation: Secondary | ICD-10-CM | POA: Diagnosis not present

## 2021-03-08 DIAGNOSIS — I2510 Atherosclerotic heart disease of native coronary artery without angina pectoris: Secondary | ICD-10-CM | POA: Diagnosis not present

## 2021-03-08 DIAGNOSIS — N179 Acute kidney failure, unspecified: Secondary | ICD-10-CM | POA: Diagnosis not present

## 2021-03-08 DIAGNOSIS — N186 End stage renal disease: Secondary | ICD-10-CM | POA: Diagnosis not present

## 2021-03-08 DIAGNOSIS — N2581 Secondary hyperparathyroidism of renal origin: Secondary | ICD-10-CM | POA: Diagnosis not present

## 2021-03-08 DIAGNOSIS — E538 Deficiency of other specified B group vitamins: Secondary | ICD-10-CM | POA: Diagnosis not present

## 2021-03-08 DIAGNOSIS — J690 Pneumonitis due to inhalation of food and vomit: Secondary | ICD-10-CM | POA: Diagnosis not present

## 2021-03-08 DIAGNOSIS — D509 Iron deficiency anemia, unspecified: Secondary | ICD-10-CM | POA: Diagnosis not present

## 2021-03-08 DIAGNOSIS — I959 Hypotension, unspecified: Secondary | ICD-10-CM | POA: Diagnosis not present

## 2021-03-08 DIAGNOSIS — I5022 Chronic systolic (congestive) heart failure: Secondary | ICD-10-CM | POA: Diagnosis not present

## 2021-03-08 DIAGNOSIS — Z992 Dependence on renal dialysis: Secondary | ICD-10-CM | POA: Diagnosis not present

## 2021-03-09 ENCOUNTER — Ambulatory Visit (INDEPENDENT_AMBULATORY_CARE_PROVIDER_SITE_OTHER): Payer: Medicare Other | Admitting: Vascular Surgery

## 2021-03-09 ENCOUNTER — Ambulatory Visit (INDEPENDENT_AMBULATORY_CARE_PROVIDER_SITE_OTHER)
Admission: RE | Admit: 2021-03-09 | Discharge: 2021-03-09 | Disposition: A | Discharge status: Home / Self Care | Payer: Medicare Other | Source: Ambulatory Visit | Attending: Vascular Surgery | Admitting: Vascular Surgery

## 2021-03-09 ENCOUNTER — Ambulatory Visit (HOSPITAL_COMMUNITY)
Admission: RE | Admit: 2021-03-09 | Discharge: 2021-03-09 | Disposition: A | Discharge status: Home / Self Care | Payer: Medicare Other | Source: Ambulatory Visit | Attending: Vascular Surgery | Admitting: Vascular Surgery

## 2021-03-09 ENCOUNTER — Other Ambulatory Visit: Payer: Self-pay

## 2021-03-09 ENCOUNTER — Encounter: Payer: Self-pay | Admitting: Vascular Surgery

## 2021-03-09 VITALS — BP 117/76 | HR 82 | Temp 98.5°F | Resp 20 | Ht 69.0 in | Wt 160.0 lb

## 2021-03-09 DIAGNOSIS — N186 End stage renal disease: Secondary | ICD-10-CM

## 2021-03-09 DIAGNOSIS — Z992 Dependence on renal dialysis: Secondary | ICD-10-CM

## 2021-03-09 NOTE — Progress Notes (Signed)
Patient ID: Raymond Scales., male   DOB: [birth year], 87 y.o.   MRN: [id number]  Reason for Consult: New Patient (Initial Visit)   Referred by Lajean Manes, MD  Subjective:     HPI:  Raymond Stick. is a 87 y.o. male history of end-stage renal disease currently on dialysis via right IJ catheter.  Previously was on Eliquis for atrial fibrillation but is no longer taking.  Recently had pancreatitis in January and has been evaluated by GI and [facility].  He is now sent for discussion of permanent dialysis access.  He is right-hand dominant.  He does not bruise easily.  He does not have any previous chest, breast or upper extremity surgery.  Patient has multiple questions regarding abilities to do peritoneal dialysis versus continuing hemodialysis.  Past Medical History:  Diagnosis Date  . Acute pancreatitis   . Atrial fibrillation ([place])   . CAD (coronary artery disease) 04/2019  . CHF (congestive heart failure) ([place])   . Chronic kidney disease   . Melanoma ([place])   . Vitamin B 12 deficiency    Family History  Problem Relation Age of Onset  . Melanoma Daughter    Past Surgical History:  Procedure Laterality Date  . CARDIOVERSION N/A 06/02/2019   Procedure: CARDIOVERSION;  Surgeon: Jerline Pain, MD;  Location: [facility] ENDOSCOPY;  Service: Cardiovascular;  Laterality: N/A;  . IR FLUORO GUIDE CV LINE RIGHT  11/06/2020  . IR US GUIDE VASC ACCESS RIGHT  11/06/2020  . RIGHT/LEFT HEART CATH AND CORONARY ANGIOGRAPHY N/A 04/27/2019   Procedure: RIGHT/LEFT HEART CATH AND CORONARY ANGIOGRAPHY;  Surgeon: Jolaine Artist, MD;  Location: [place] CV LAB;  Service: Cardiovascular;  Laterality: N/A;  . TEE WITHOUT CARDIOVERSION N/A 06/02/2019   Procedure: TRANSESOPHAGEAL ECHOCARDIOGRAM (TEE);  Surgeon: Jerline Pain, MD;  Location: [facility] ENDOSCOPY;  Service: Cardiovascular;  Laterality: N/A;    Short Social History:  Social History   Tobacco Use  . Smoking status: Never Smoker   . Smokeless tobacco: Never Used  Substance Use Topics  . Alcohol use: Yes    Alcohol/week: 7.0 standard drinks    Types: 7 Glasses of wine per week    Allergies  Allergen Reactions  . Povidone-Iodine Swelling  . Tape Swelling  . Bee Venom Rash  . Covid-19 Mrna Vacc (Moderna) Hives and Rash    Rash came after booster shot. Pt was fine with 1st and 2nd    Current Outpatient Medications  Medication Sig Dispense Refill  . amiodarone (PACERONE) 200 MG tablet Take one pill twice a day till 02/25. Starting on 02/26 decrease to one pill daily. 35 tablet 0  . docusate sodium (COLACE) 100 MG capsule Take 100 mg by mouth 2 (two) times daily.    Marland Kitchen lactobacillus acidophilus & bulgar (LACTINEX) chewable tablet Chew 1 tablet by mouth 3 (three) times daily with meals. 90 tablet 0  . midodrine (PROAMATINE) 10 MG tablet Take 1 tablet (10 mg total) by mouth 3 (three) times daily. 90 tablet 0  . Nutritional Supplements (,FEEDING SUPPLEMENT, PROSOURCE PLUS) liquid Take 30 mLs by mouth 2 (two) times daily between meals. 887 mL 0  . pantoprazole (PROTONIX) 40 MG tablet Take 1 tablet by mouth every morning.     No current facility-administered medications for this visit.    Review of Systems  Constitutional:  Constitutional negative. HENT: HENT negative.  Eyes: Eyes negative.  Respiratory: Positive for shortness of breath.  Cardiovascular: Positive for leg  swelling.  GI: Gastrointestinal negative.  Musculoskeletal: Musculoskeletal negative.  Skin: Skin negative.  Neurological: Positive for dizziness.  Hematologic: Positive for bruises/bleeds easily.        Objective:  Objective   Vitals:   03/09/21 1128  BP: 117/76  Pulse: 82  Resp: 20  Temp: 98.5 F (36.9 C)  SpO2: 95%  Weight: 160 lb (72.6 kg)  Height: 5\' 9"  (1.753 m)   Body mass index is 23.63 kg/m.  Physical Exam HENT:     Head: Normocephalic.     Nose:     Comments: Wearing a mask Eyes:     Pupils: Pupils are equal,  round, and reactive to light.  Cardiovascular:     Pulses: Normal pulses.  Abdominal:     General: Abdomen is flat.     Palpations: Abdomen is soft. There is no mass.     Comments: No large incisions he does have a right lower quadrant appendectomy incision that is well-healed  Musculoskeletal:     Right lower leg: Edema present.     Left lower leg: Edema present.  Skin:    General: Skin is warm.     Capillary Refill: Capillary refill takes less than 2 seconds.  Neurological:     General: No focal deficit present.     Mental Status: He is alert.  Psychiatric:        Mood and Affect: Mood normal.        Behavior: Behavior normal.        Thought Content: Thought content normal.        Judgment: Judgment normal.     Data: Right Pre-Dialysis Findings:  +-----------------------+----------+--------------------+---------+--------  +  Location        PSV (cm/s)Intralum. Diam. (cm)Waveform  Comments  +-----------------------+----------+--------------------+---------+--------  +  Brachial Antecub. fossa63    0.45        triphasic        +-----------------------+----------+--------------------+---------+--------  +  Radial Art at Wrist  67    0.21        triphasic        +-----------------------+----------+--------------------+---------+--------  +  Ulnar Art at Wrist   40    0.23        triphasic        +-----------------------+----------+--------------------+---------+--------  +   Left Pre-Dialysis Findings:  +-----------------------+----------+--------------------+---------+--------  +  Location        PSV (cm/s)Intralum. Diam. (cm)Waveform  Comments  +-----------------------+----------+--------------------+---------+--------  +  Brachial Antecub. fossa59    0.50        triphasic         +-----------------------+----------+--------------------+---------+--------  +  Radial Art at Wrist  55    0.19        triphasic        +-----------------------+----------+--------------------+---------+--------  +  Ulnar Art at Wrist   53    0.25        triphasic        +-----------------------+----------+--------------------+---------+--------  +  +-----------------+-------------+----------+-------------+  Right Cephalic  Diameter (cm)Depth (cm) Findings    +-----------------+-------------+----------+-------------+  Shoulder       0.36                 +-----------------+-------------+----------+-------------+  Prox upper arm    0.41                 +-----------------+-------------+----------+-------------+  Mid upper arm    0.36                 +-----------------+-------------+----------+-------------+  Dist upper arm  0.37                 +-----------------+-------------+----------+-------------+  Antecubital fossa  0.33                 +-----------------+-------------+----------+-------------+  Prox forearm     0.44                 +-----------------+-------------+----------+-------------+  Mid forearm     0.30                 +-----------------+-------------+----------+-------------+  Dist forearm     0.28        back of wrist  +-----------------+-------------+----------+-------------+   +-----------------+-------------+----------+--------------+  Right Basilic  Diameter (cm)Depth (cm)  Findings    +-----------------+-------------+----------+--------------+  Prox upper arm    0.40                 +-----------------+-------------+----------+--------------+  Mid upper arm    0.34                  +-----------------+-------------+----------+--------------+  Dist upper arm    0.44                 +-----------------+-------------+----------+--------------+  Antecubital fossa  0.63                 +-----------------+-------------+----------+--------------+  Prox forearm     0.27                 +-----------------+-------------+----------+--------------+  Mid forearm     0.36                 +-----------------+-------------+----------+--------------+  Distal forearm    0.29        joins cephalic  +-----------------+-------------+----------+--------------+   +-----------------+-------------+----------+--------------+  Left Cephalic  Diameter (cm)Depth (cm)  Findings    +-----------------+-------------+----------+--------------+  Shoulder                 not visualized  +-----------------+-------------+----------+--------------+  Prox upper arm              not visualized  +-----------------+-------------+----------+--------------+  Mid upper arm    0.11                 +-----------------+-------------+----------+--------------+  Dist upper arm              not visualized  +-----------------+-------------+----------+--------------+  Antecubital fossa            not visualized  +-----------------+-------------+----------+--------------+  Prox forearm               not visualized  +-----------------+-------------+----------+--------------+  Mid forearm               not visualized  +-----------------+-------------+----------+--------------+  Dist forearm               not visualized  +-----------------+-------------+----------+--------------+  Wrist                  not visualized   +-----------------+-------------+----------+--------------+   +-----------------+-------------+----------+-----------------------------+  Left Basilic   Diameter (cm)Depth (cm)     Findings        +-----------------+-------------+----------+-----------------------------+  Prox upper arm    0.65                         +-----------------+-------------+----------+-----------------------------+  Mid upper arm    0.62        0.413 cm branch to brachial v  +-----------------+-------------+----------+-----------------------------+  Dist upper arm    0.47  0.32 cm branch      +-----------------+-------------+----------+-----------------------------+  Antecubital fossa  0.41                         +-----------------+-------------+----------+-----------------------------+  Prox forearm     0.22                         +-----------------+-------------+----------+-----------------------------+  Mid forearm     0.28                         +-----------------+-------------+----------+-----------------------------+  Distal forearm    0.28            back of wrist      +-----------------+-------------+----------+-----------------------------+   Summary: Right: Patent cephalic and basilic veins.  Left: Patent basilic vein with thickened walls. Cephalic vein    tiny/not visualized.       Assessment/Plan:     87 year old male sent for evaluation of permanent dialysis access.  Patient is very interested in peritoneal dialysis catheter however given recent pancreatitis I am unsure that this would be in his best interest at this time from a technical standpoint as well as infectious.  We will plan for left upper extremity access surgery possible two-stage basilic vein fistula versus brachial artery  based AV graft.  Patient has multiple questions regarding the nature of the surgery as well as continued questions regarding peritoneal dialysis.  I have attempted to answer all of his questions to the best of my ability today and have encouraged he and his wife to bring all the questions with him to surgery.  Certainly if he completes his work-up with GI and peritoneal dialysis is considered an option with his nephrologist we can revisit this topic in the future.     Waynetta Sandy MD [facility]

## 2021-03-10 DIAGNOSIS — D631 Anemia in chronic kidney disease: Secondary | ICD-10-CM | POA: Diagnosis not present

## 2021-03-10 DIAGNOSIS — D509 Iron deficiency anemia, unspecified: Secondary | ICD-10-CM | POA: Diagnosis not present

## 2021-03-10 DIAGNOSIS — Z992 Dependence on renal dialysis: Secondary | ICD-10-CM | POA: Diagnosis not present

## 2021-03-10 DIAGNOSIS — N2581 Secondary hyperparathyroidism of renal origin: Secondary | ICD-10-CM | POA: Diagnosis not present

## 2021-03-10 DIAGNOSIS — N186 End stage renal disease: Secondary | ICD-10-CM | POA: Diagnosis not present

## 2021-03-12 DIAGNOSIS — N186 End stage renal disease: Secondary | ICD-10-CM | POA: Diagnosis not present

## 2021-03-12 DIAGNOSIS — L89313 Pressure ulcer of right buttock, stage 3: Secondary | ICD-10-CM | POA: Diagnosis not present

## 2021-03-12 DIAGNOSIS — I5022 Chronic systolic (congestive) heart failure: Secondary | ICD-10-CM | POA: Diagnosis not present

## 2021-03-12 DIAGNOSIS — I2510 Atherosclerotic heart disease of native coronary artery without angina pectoris: Secondary | ICD-10-CM | POA: Diagnosis not present

## 2021-03-12 DIAGNOSIS — I4891 Unspecified atrial fibrillation: Secondary | ICD-10-CM | POA: Diagnosis not present

## 2021-03-12 DIAGNOSIS — D631 Anemia in chronic kidney disease: Secondary | ICD-10-CM | POA: Diagnosis not present

## 2021-03-13 DIAGNOSIS — Z992 Dependence on renal dialysis: Secondary | ICD-10-CM | POA: Diagnosis not present

## 2021-03-13 DIAGNOSIS — D509 Iron deficiency anemia, unspecified: Secondary | ICD-10-CM | POA: Diagnosis not present

## 2021-03-13 DIAGNOSIS — N2581 Secondary hyperparathyroidism of renal origin: Secondary | ICD-10-CM | POA: Diagnosis not present

## 2021-03-13 DIAGNOSIS — N186 End stage renal disease: Secondary | ICD-10-CM | POA: Diagnosis not present

## 2021-03-13 DIAGNOSIS — D631 Anemia in chronic kidney disease: Secondary | ICD-10-CM | POA: Diagnosis not present

## 2021-03-14 ENCOUNTER — Telehealth: Payer: Self-pay

## 2021-03-14 ENCOUNTER — Other Ambulatory Visit: Payer: Self-pay

## 2021-03-14 ENCOUNTER — Ambulatory Visit (HOSPITAL_COMMUNITY)
Admission: RE | Admit: 2021-03-14 | Discharge: 2021-03-14 | Disposition: A | Discharge status: Home / Self Care | Payer: Medicare Other | Source: Ambulatory Visit | Attending: Gastroenterology | Admitting: Gastroenterology

## 2021-03-14 ENCOUNTER — Other Ambulatory Visit (HOSPITAL_COMMUNITY): Payer: Self-pay | Admitting: Gastroenterology

## 2021-03-14 DIAGNOSIS — Z85820 Personal history of malignant melanoma of skin: Secondary | ICD-10-CM | POA: Diagnosis not present

## 2021-03-14 DIAGNOSIS — S36892A Contusion of other intra-abdominal organs, initial encounter: Secondary | ICD-10-CM | POA: Diagnosis not present

## 2021-03-14 DIAGNOSIS — D509 Iron deficiency anemia, unspecified: Secondary | ICD-10-CM | POA: Diagnosis not present

## 2021-03-14 DIAGNOSIS — N186 End stage renal disease: Secondary | ICD-10-CM | POA: Diagnosis not present

## 2021-03-14 DIAGNOSIS — K863 Pseudocyst of pancreas: Secondary | ICD-10-CM | POA: Insufficient documentation

## 2021-03-14 DIAGNOSIS — Z992 Dependence on renal dialysis: Secondary | ICD-10-CM | POA: Diagnosis not present

## 2021-03-14 DIAGNOSIS — I129 Hypertensive chronic kidney disease with stage 1 through stage 4 chronic kidney disease, or unspecified chronic kidney disease: Secondary | ICD-10-CM | POA: Diagnosis not present

## 2021-03-14 DIAGNOSIS — N2581 Secondary hyperparathyroidism of renal origin: Secondary | ICD-10-CM | POA: Diagnosis not present

## 2021-03-14 DIAGNOSIS — N19 Unspecified kidney failure: Secondary | ICD-10-CM | POA: Diagnosis not present

## 2021-03-14 MED ORDER — SODIUM CHLORIDE (PF) 0.9 % IJ SOLN
INTRAMUSCULAR | Status: AC
  Filled 2021-03-14: qty 50

## 2021-03-14 NOTE — Telephone Encounter (Signed)
Sharyn Lull OT ( ph# [phone]) with [facility] called:   Patient declined occupational therapy session this week. Will resume next week.

## 2021-03-15 DIAGNOSIS — D509 Iron deficiency anemia, unspecified: Secondary | ICD-10-CM | POA: Diagnosis not present

## 2021-03-15 DIAGNOSIS — Z992 Dependence on renal dialysis: Secondary | ICD-10-CM | POA: Diagnosis not present

## 2021-03-15 DIAGNOSIS — N2581 Secondary hyperparathyroidism of renal origin: Secondary | ICD-10-CM | POA: Diagnosis not present

## 2021-03-15 DIAGNOSIS — N186 End stage renal disease: Secondary | ICD-10-CM | POA: Diagnosis not present

## 2021-03-17 DIAGNOSIS — N186 End stage renal disease: Secondary | ICD-10-CM | POA: Diagnosis not present

## 2021-03-17 DIAGNOSIS — N2581 Secondary hyperparathyroidism of renal origin: Secondary | ICD-10-CM | POA: Diagnosis not present

## 2021-03-17 DIAGNOSIS — Z992 Dependence on renal dialysis: Secondary | ICD-10-CM | POA: Diagnosis not present

## 2021-03-17 DIAGNOSIS — D509 Iron deficiency anemia, unspecified: Secondary | ICD-10-CM | POA: Diagnosis not present

## 2021-03-19 DIAGNOSIS — N186 End stage renal disease: Secondary | ICD-10-CM | POA: Diagnosis not present

## 2021-03-19 DIAGNOSIS — I2510 Atherosclerotic heart disease of native coronary artery without angina pectoris: Secondary | ICD-10-CM | POA: Diagnosis not present

## 2021-03-19 DIAGNOSIS — I5022 Chronic systolic (congestive) heart failure: Secondary | ICD-10-CM | POA: Diagnosis not present

## 2021-03-19 DIAGNOSIS — I4891 Unspecified atrial fibrillation: Secondary | ICD-10-CM | POA: Diagnosis not present

## 2021-03-19 DIAGNOSIS — D631 Anemia in chronic kidney disease: Secondary | ICD-10-CM | POA: Diagnosis not present

## 2021-03-19 DIAGNOSIS — L89313 Pressure ulcer of right buttock, stage 3: Secondary | ICD-10-CM | POA: Diagnosis not present

## 2021-03-20 DIAGNOSIS — D509 Iron deficiency anemia, unspecified: Secondary | ICD-10-CM | POA: Diagnosis not present

## 2021-03-20 DIAGNOSIS — N2581 Secondary hyperparathyroidism of renal origin: Secondary | ICD-10-CM | POA: Diagnosis not present

## 2021-03-20 DIAGNOSIS — Z992 Dependence on renal dialysis: Secondary | ICD-10-CM | POA: Diagnosis not present

## 2021-03-20 DIAGNOSIS — N186 End stage renal disease: Secondary | ICD-10-CM | POA: Diagnosis not present

## 2021-03-21 DIAGNOSIS — N186 End stage renal disease: Secondary | ICD-10-CM | POA: Diagnosis not present

## 2021-03-21 DIAGNOSIS — L89313 Pressure ulcer of right buttock, stage 3: Secondary | ICD-10-CM | POA: Diagnosis not present

## 2021-03-21 DIAGNOSIS — D631 Anemia in chronic kidney disease: Secondary | ICD-10-CM | POA: Diagnosis not present

## 2021-03-21 DIAGNOSIS — I5022 Chronic systolic (congestive) heart failure: Secondary | ICD-10-CM | POA: Diagnosis not present

## 2021-03-21 DIAGNOSIS — I4891 Unspecified atrial fibrillation: Secondary | ICD-10-CM | POA: Diagnosis not present

## 2021-03-21 DIAGNOSIS — I2510 Atherosclerotic heart disease of native coronary artery without angina pectoris: Secondary | ICD-10-CM | POA: Diagnosis not present

## 2021-03-22 DIAGNOSIS — D509 Iron deficiency anemia, unspecified: Secondary | ICD-10-CM | POA: Diagnosis not present

## 2021-03-22 DIAGNOSIS — N2581 Secondary hyperparathyroidism of renal origin: Secondary | ICD-10-CM | POA: Diagnosis not present

## 2021-03-22 DIAGNOSIS — Z992 Dependence on renal dialysis: Secondary | ICD-10-CM | POA: Diagnosis not present

## 2021-03-22 DIAGNOSIS — N186 End stage renal disease: Secondary | ICD-10-CM | POA: Diagnosis not present

## 2021-03-24 DIAGNOSIS — D509 Iron deficiency anemia, unspecified: Secondary | ICD-10-CM | POA: Diagnosis not present

## 2021-03-24 DIAGNOSIS — N2581 Secondary hyperparathyroidism of renal origin: Secondary | ICD-10-CM | POA: Diagnosis not present

## 2021-03-24 DIAGNOSIS — Z992 Dependence on renal dialysis: Secondary | ICD-10-CM | POA: Diagnosis not present

## 2021-03-24 DIAGNOSIS — N186 End stage renal disease: Secondary | ICD-10-CM | POA: Diagnosis not present

## 2021-03-27 DIAGNOSIS — Z992 Dependence on renal dialysis: Secondary | ICD-10-CM | POA: Diagnosis not present

## 2021-03-27 DIAGNOSIS — N186 End stage renal disease: Secondary | ICD-10-CM | POA: Diagnosis not present

## 2021-03-27 DIAGNOSIS — N2581 Secondary hyperparathyroidism of renal origin: Secondary | ICD-10-CM | POA: Diagnosis not present

## 2021-03-27 DIAGNOSIS — D509 Iron deficiency anemia, unspecified: Secondary | ICD-10-CM | POA: Diagnosis not present

## 2021-03-28 DIAGNOSIS — L89313 Pressure ulcer of right buttock, stage 3: Secondary | ICD-10-CM | POA: Diagnosis not present

## 2021-03-28 DIAGNOSIS — N186 End stage renal disease: Secondary | ICD-10-CM | POA: Diagnosis not present

## 2021-03-28 DIAGNOSIS — I4891 Unspecified atrial fibrillation: Secondary | ICD-10-CM | POA: Diagnosis not present

## 2021-03-28 DIAGNOSIS — I2510 Atherosclerotic heart disease of native coronary artery without angina pectoris: Secondary | ICD-10-CM | POA: Diagnosis not present

## 2021-03-28 DIAGNOSIS — I5022 Chronic systolic (congestive) heart failure: Secondary | ICD-10-CM | POA: Diagnosis not present

## 2021-03-28 DIAGNOSIS — D631 Anemia in chronic kidney disease: Secondary | ICD-10-CM | POA: Diagnosis not present

## 2021-03-29 DIAGNOSIS — D509 Iron deficiency anemia, unspecified: Secondary | ICD-10-CM | POA: Diagnosis not present

## 2021-03-29 DIAGNOSIS — Z992 Dependence on renal dialysis: Secondary | ICD-10-CM | POA: Diagnosis not present

## 2021-03-29 DIAGNOSIS — N186 End stage renal disease: Secondary | ICD-10-CM | POA: Diagnosis not present

## 2021-03-29 DIAGNOSIS — N2581 Secondary hyperparathyroidism of renal origin: Secondary | ICD-10-CM | POA: Diagnosis not present

## 2021-03-30 DIAGNOSIS — I4891 Unspecified atrial fibrillation: Secondary | ICD-10-CM | POA: Diagnosis not present

## 2021-03-30 DIAGNOSIS — I5022 Chronic systolic (congestive) heart failure: Secondary | ICD-10-CM | POA: Diagnosis not present

## 2021-03-30 DIAGNOSIS — D631 Anemia in chronic kidney disease: Secondary | ICD-10-CM | POA: Diagnosis not present

## 2021-03-30 DIAGNOSIS — L89313 Pressure ulcer of right buttock, stage 3: Secondary | ICD-10-CM | POA: Diagnosis not present

## 2021-03-30 DIAGNOSIS — N186 End stage renal disease: Secondary | ICD-10-CM | POA: Diagnosis not present

## 2021-03-30 DIAGNOSIS — I2510 Atherosclerotic heart disease of native coronary artery without angina pectoris: Secondary | ICD-10-CM | POA: Diagnosis not present

## 2021-03-31 DIAGNOSIS — Z992 Dependence on renal dialysis: Secondary | ICD-10-CM | POA: Diagnosis not present

## 2021-03-31 DIAGNOSIS — D509 Iron deficiency anemia, unspecified: Secondary | ICD-10-CM | POA: Diagnosis not present

## 2021-03-31 DIAGNOSIS — N2581 Secondary hyperparathyroidism of renal origin: Secondary | ICD-10-CM | POA: Diagnosis not present

## 2021-03-31 DIAGNOSIS — N186 End stage renal disease: Secondary | ICD-10-CM | POA: Diagnosis not present

## 2021-04-03 DIAGNOSIS — Z992 Dependence on renal dialysis: Secondary | ICD-10-CM | POA: Diagnosis not present

## 2021-04-03 DIAGNOSIS — N186 End stage renal disease: Secondary | ICD-10-CM | POA: Diagnosis not present

## 2021-04-03 DIAGNOSIS — N2581 Secondary hyperparathyroidism of renal origin: Secondary | ICD-10-CM | POA: Diagnosis not present

## 2021-04-03 DIAGNOSIS — D509 Iron deficiency anemia, unspecified: Secondary | ICD-10-CM | POA: Diagnosis not present

## 2021-04-04 DIAGNOSIS — D3131 Benign neoplasm of right choroid: Secondary | ICD-10-CM | POA: Diagnosis not present

## 2021-04-04 DIAGNOSIS — H43813 Vitreous degeneration, bilateral: Secondary | ICD-10-CM | POA: Diagnosis not present

## 2021-04-04 DIAGNOSIS — H40013 Open angle with borderline findings, low risk, bilateral: Secondary | ICD-10-CM | POA: Diagnosis not present

## 2021-04-04 DIAGNOSIS — H0102B Squamous blepharitis left eye, upper and lower eyelids: Secondary | ICD-10-CM | POA: Diagnosis not present

## 2021-04-04 DIAGNOSIS — H0102A Squamous blepharitis right eye, upper and lower eyelids: Secondary | ICD-10-CM | POA: Diagnosis not present

## 2021-04-04 DIAGNOSIS — H04123 Dry eye syndrome of bilateral lacrimal glands: Secondary | ICD-10-CM | POA: Diagnosis not present

## 2021-04-04 DIAGNOSIS — H2513 Age-related nuclear cataract, bilateral: Secondary | ICD-10-CM | POA: Diagnosis not present

## 2021-04-05 DIAGNOSIS — N186 End stage renal disease: Secondary | ICD-10-CM | POA: Diagnosis not present

## 2021-04-05 DIAGNOSIS — N2581 Secondary hyperparathyroidism of renal origin: Secondary | ICD-10-CM | POA: Diagnosis not present

## 2021-04-05 DIAGNOSIS — Z992 Dependence on renal dialysis: Secondary | ICD-10-CM | POA: Diagnosis not present

## 2021-04-05 DIAGNOSIS — D509 Iron deficiency anemia, unspecified: Secondary | ICD-10-CM | POA: Diagnosis not present

## 2021-04-06 DIAGNOSIS — I4891 Unspecified atrial fibrillation: Secondary | ICD-10-CM | POA: Diagnosis not present

## 2021-04-06 DIAGNOSIS — D631 Anemia in chronic kidney disease: Secondary | ICD-10-CM | POA: Diagnosis not present

## 2021-04-06 DIAGNOSIS — I2510 Atherosclerotic heart disease of native coronary artery without angina pectoris: Secondary | ICD-10-CM | POA: Diagnosis not present

## 2021-04-06 DIAGNOSIS — L89313 Pressure ulcer of right buttock, stage 3: Secondary | ICD-10-CM | POA: Diagnosis not present

## 2021-04-06 DIAGNOSIS — I5022 Chronic systolic (congestive) heart failure: Secondary | ICD-10-CM | POA: Diagnosis not present

## 2021-04-06 DIAGNOSIS — N186 End stage renal disease: Secondary | ICD-10-CM | POA: Diagnosis not present

## 2021-04-07 DIAGNOSIS — D509 Iron deficiency anemia, unspecified: Secondary | ICD-10-CM | POA: Diagnosis not present

## 2021-04-07 DIAGNOSIS — I5022 Chronic systolic (congestive) heart failure: Secondary | ICD-10-CM | POA: Diagnosis not present

## 2021-04-07 DIAGNOSIS — I959 Hypotension, unspecified: Secondary | ICD-10-CM | POA: Diagnosis not present

## 2021-04-07 DIAGNOSIS — Z85820 Personal history of malignant melanoma of skin: Secondary | ICD-10-CM | POA: Diagnosis not present

## 2021-04-07 DIAGNOSIS — I4891 Unspecified atrial fibrillation: Secondary | ICD-10-CM | POA: Diagnosis not present

## 2021-04-07 DIAGNOSIS — E538 Deficiency of other specified B group vitamins: Secondary | ICD-10-CM | POA: Diagnosis not present

## 2021-04-07 DIAGNOSIS — D631 Anemia in chronic kidney disease: Secondary | ICD-10-CM | POA: Diagnosis not present

## 2021-04-07 DIAGNOSIS — Z9181 History of falling: Secondary | ICD-10-CM | POA: Diagnosis not present

## 2021-04-07 DIAGNOSIS — I2510 Atherosclerotic heart disease of native coronary artery without angina pectoris: Secondary | ICD-10-CM | POA: Diagnosis not present

## 2021-04-07 DIAGNOSIS — R1310 Dysphagia, unspecified: Secondary | ICD-10-CM | POA: Diagnosis not present

## 2021-04-07 DIAGNOSIS — Z8616 Personal history of COVID-19: Secondary | ICD-10-CM | POA: Diagnosis not present

## 2021-04-07 DIAGNOSIS — N186 End stage renal disease: Secondary | ICD-10-CM | POA: Diagnosis not present

## 2021-04-07 DIAGNOSIS — N2581 Secondary hyperparathyroidism of renal origin: Secondary | ICD-10-CM | POA: Diagnosis not present

## 2021-04-07 DIAGNOSIS — Z992 Dependence on renal dialysis: Secondary | ICD-10-CM | POA: Diagnosis not present

## 2021-04-10 DIAGNOSIS — D509 Iron deficiency anemia, unspecified: Secondary | ICD-10-CM | POA: Diagnosis not present

## 2021-04-10 DIAGNOSIS — N2581 Secondary hyperparathyroidism of renal origin: Secondary | ICD-10-CM | POA: Diagnosis not present

## 2021-04-10 DIAGNOSIS — Z992 Dependence on renal dialysis: Secondary | ICD-10-CM | POA: Diagnosis not present

## 2021-04-10 DIAGNOSIS — N186 End stage renal disease: Secondary | ICD-10-CM | POA: Diagnosis not present

## 2021-04-11 DIAGNOSIS — L821 Other seborrheic keratosis: Secondary | ICD-10-CM | POA: Diagnosis not present

## 2021-04-11 DIAGNOSIS — Z85820 Personal history of malignant melanoma of skin: Secondary | ICD-10-CM | POA: Diagnosis not present

## 2021-04-11 DIAGNOSIS — I2510 Atherosclerotic heart disease of native coronary artery without angina pectoris: Secondary | ICD-10-CM | POA: Diagnosis not present

## 2021-04-11 DIAGNOSIS — D485 Neoplasm of uncertain behavior of skin: Secondary | ICD-10-CM | POA: Diagnosis not present

## 2021-04-11 DIAGNOSIS — L812 Freckles: Secondary | ICD-10-CM | POA: Diagnosis not present

## 2021-04-11 DIAGNOSIS — D631 Anemia in chronic kidney disease: Secondary | ICD-10-CM | POA: Diagnosis not present

## 2021-04-11 DIAGNOSIS — D1801 Hemangioma of skin and subcutaneous tissue: Secondary | ICD-10-CM | POA: Diagnosis not present

## 2021-04-11 DIAGNOSIS — C4442 Squamous cell carcinoma of skin of scalp and neck: Secondary | ICD-10-CM | POA: Diagnosis not present

## 2021-04-11 DIAGNOSIS — I4891 Unspecified atrial fibrillation: Secondary | ICD-10-CM | POA: Diagnosis not present

## 2021-04-11 DIAGNOSIS — Z85828 Personal history of other malignant neoplasm of skin: Secondary | ICD-10-CM | POA: Diagnosis not present

## 2021-04-11 DIAGNOSIS — R1310 Dysphagia, unspecified: Secondary | ICD-10-CM | POA: Diagnosis not present

## 2021-04-11 DIAGNOSIS — I5022 Chronic systolic (congestive) heart failure: Secondary | ICD-10-CM | POA: Diagnosis not present

## 2021-04-11 DIAGNOSIS — N186 End stage renal disease: Secondary | ICD-10-CM | POA: Diagnosis not present

## 2021-04-11 DIAGNOSIS — L570 Actinic keratosis: Secondary | ICD-10-CM | POA: Diagnosis not present

## 2021-04-12 DIAGNOSIS — N186 End stage renal disease: Secondary | ICD-10-CM | POA: Diagnosis not present

## 2021-04-12 DIAGNOSIS — D509 Iron deficiency anemia, unspecified: Secondary | ICD-10-CM | POA: Diagnosis not present

## 2021-04-12 DIAGNOSIS — Z992 Dependence on renal dialysis: Secondary | ICD-10-CM | POA: Diagnosis not present

## 2021-04-12 DIAGNOSIS — N2581 Secondary hyperparathyroidism of renal origin: Secondary | ICD-10-CM | POA: Diagnosis not present

## 2021-04-13 DIAGNOSIS — N186 End stage renal disease: Secondary | ICD-10-CM | POA: Diagnosis not present

## 2021-04-13 DIAGNOSIS — Z992 Dependence on renal dialysis: Secondary | ICD-10-CM | POA: Diagnosis not present

## 2021-04-13 DIAGNOSIS — I129 Hypertensive chronic kidney disease with stage 1 through stage 4 chronic kidney disease, or unspecified chronic kidney disease: Secondary | ICD-10-CM | POA: Diagnosis not present

## 2021-04-14 DIAGNOSIS — N2581 Secondary hyperparathyroidism of renal origin: Secondary | ICD-10-CM | POA: Diagnosis not present

## 2021-04-14 DIAGNOSIS — Z992 Dependence on renal dialysis: Secondary | ICD-10-CM | POA: Diagnosis not present

## 2021-04-14 DIAGNOSIS — D509 Iron deficiency anemia, unspecified: Secondary | ICD-10-CM | POA: Diagnosis not present

## 2021-04-14 DIAGNOSIS — E8770 Fluid overload, unspecified: Secondary | ICD-10-CM | POA: Diagnosis not present

## 2021-04-14 DIAGNOSIS — N186 End stage renal disease: Secondary | ICD-10-CM | POA: Diagnosis not present

## 2021-04-15 NOTE — Addendum Note (Signed)
Encounter addended by: Annie Paras on: 04/15/2021 5:20 PM  Actions taken: Letter saved

## 2021-04-17 DIAGNOSIS — N186 End stage renal disease: Secondary | ICD-10-CM | POA: Diagnosis not present

## 2021-04-17 DIAGNOSIS — Z992 Dependence on renal dialysis: Secondary | ICD-10-CM | POA: Diagnosis not present

## 2021-04-17 DIAGNOSIS — E8770 Fluid overload, unspecified: Secondary | ICD-10-CM | POA: Diagnosis not present

## 2021-04-17 DIAGNOSIS — D509 Iron deficiency anemia, unspecified: Secondary | ICD-10-CM | POA: Diagnosis not present

## 2021-04-17 DIAGNOSIS — N2581 Secondary hyperparathyroidism of renal origin: Secondary | ICD-10-CM | POA: Diagnosis not present

## 2021-04-18 ENCOUNTER — Other Ambulatory Visit: Payer: Medicare Other

## 2021-04-18 DIAGNOSIS — N186 End stage renal disease: Secondary | ICD-10-CM | POA: Diagnosis not present

## 2021-04-18 DIAGNOSIS — D631 Anemia in chronic kidney disease: Secondary | ICD-10-CM | POA: Diagnosis not present

## 2021-04-18 DIAGNOSIS — I2510 Atherosclerotic heart disease of native coronary artery without angina pectoris: Secondary | ICD-10-CM | POA: Diagnosis not present

## 2021-04-18 DIAGNOSIS — I4891 Unspecified atrial fibrillation: Secondary | ICD-10-CM | POA: Diagnosis not present

## 2021-04-18 DIAGNOSIS — I5022 Chronic systolic (congestive) heart failure: Secondary | ICD-10-CM | POA: Diagnosis not present

## 2021-04-18 DIAGNOSIS — R1310 Dysphagia, unspecified: Secondary | ICD-10-CM | POA: Diagnosis not present

## 2021-04-19 DIAGNOSIS — D509 Iron deficiency anemia, unspecified: Secondary | ICD-10-CM | POA: Diagnosis not present

## 2021-04-19 DIAGNOSIS — E8770 Fluid overload, unspecified: Secondary | ICD-10-CM | POA: Diagnosis not present

## 2021-04-19 DIAGNOSIS — Z992 Dependence on renal dialysis: Secondary | ICD-10-CM | POA: Diagnosis not present

## 2021-04-19 DIAGNOSIS — N2581 Secondary hyperparathyroidism of renal origin: Secondary | ICD-10-CM | POA: Diagnosis not present

## 2021-04-19 DIAGNOSIS — N186 End stage renal disease: Secondary | ICD-10-CM | POA: Diagnosis not present

## 2021-04-20 ENCOUNTER — Other Ambulatory Visit (HOSPITAL_COMMUNITY): Payer: Self-pay | Admitting: Gastroenterology

## 2021-04-20 DIAGNOSIS — Z992 Dependence on renal dialysis: Secondary | ICD-10-CM | POA: Diagnosis not present

## 2021-04-20 DIAGNOSIS — N186 End stage renal disease: Secondary | ICD-10-CM | POA: Diagnosis not present

## 2021-04-20 DIAGNOSIS — K863 Pseudocyst of pancreas: Secondary | ICD-10-CM

## 2021-04-20 DIAGNOSIS — U071 COVID-19: Secondary | ICD-10-CM | POA: Diagnosis not present

## 2021-04-20 DIAGNOSIS — Z86711 Personal history of pulmonary embolism: Secondary | ICD-10-CM | POA: Diagnosis not present

## 2021-04-20 DIAGNOSIS — R9389 Abnormal findings on diagnostic imaging of other specified body structures: Secondary | ICD-10-CM | POA: Diagnosis not present

## 2021-04-20 DIAGNOSIS — Z8679 Personal history of other diseases of the circulatory system: Secondary | ICD-10-CM | POA: Diagnosis not present

## 2021-04-23 DIAGNOSIS — Z992 Dependence on renal dialysis: Secondary | ICD-10-CM | POA: Diagnosis not present

## 2021-04-23 DIAGNOSIS — N2581 Secondary hyperparathyroidism of renal origin: Secondary | ICD-10-CM | POA: Diagnosis not present

## 2021-04-23 DIAGNOSIS — E8770 Fluid overload, unspecified: Secondary | ICD-10-CM | POA: Diagnosis not present

## 2021-04-23 DIAGNOSIS — N186 End stage renal disease: Secondary | ICD-10-CM | POA: Diagnosis not present

## 2021-04-23 DIAGNOSIS — D509 Iron deficiency anemia, unspecified: Secondary | ICD-10-CM | POA: Diagnosis not present

## 2021-04-24 DIAGNOSIS — N2581 Secondary hyperparathyroidism of renal origin: Secondary | ICD-10-CM | POA: Diagnosis not present

## 2021-04-24 DIAGNOSIS — E8770 Fluid overload, unspecified: Secondary | ICD-10-CM | POA: Diagnosis not present

## 2021-04-24 DIAGNOSIS — N186 End stage renal disease: Secondary | ICD-10-CM | POA: Diagnosis not present

## 2021-04-24 DIAGNOSIS — D509 Iron deficiency anemia, unspecified: Secondary | ICD-10-CM | POA: Diagnosis not present

## 2021-04-24 DIAGNOSIS — Z992 Dependence on renal dialysis: Secondary | ICD-10-CM | POA: Diagnosis not present

## 2021-04-25 DIAGNOSIS — I4891 Unspecified atrial fibrillation: Secondary | ICD-10-CM | POA: Diagnosis not present

## 2021-04-25 DIAGNOSIS — R1310 Dysphagia, unspecified: Secondary | ICD-10-CM | POA: Diagnosis not present

## 2021-04-25 DIAGNOSIS — I5022 Chronic systolic (congestive) heart failure: Secondary | ICD-10-CM | POA: Diagnosis not present

## 2021-04-25 DIAGNOSIS — N186 End stage renal disease: Secondary | ICD-10-CM | POA: Diagnosis not present

## 2021-04-25 DIAGNOSIS — I2510 Atherosclerotic heart disease of native coronary artery without angina pectoris: Secondary | ICD-10-CM | POA: Diagnosis not present

## 2021-04-25 DIAGNOSIS — D631 Anemia in chronic kidney disease: Secondary | ICD-10-CM | POA: Diagnosis not present

## 2021-04-26 DIAGNOSIS — N2581 Secondary hyperparathyroidism of renal origin: Secondary | ICD-10-CM | POA: Diagnosis not present

## 2021-04-26 DIAGNOSIS — N186 End stage renal disease: Secondary | ICD-10-CM | POA: Diagnosis not present

## 2021-04-26 DIAGNOSIS — Z992 Dependence on renal dialysis: Secondary | ICD-10-CM | POA: Diagnosis not present

## 2021-04-26 DIAGNOSIS — D509 Iron deficiency anemia, unspecified: Secondary | ICD-10-CM | POA: Diagnosis not present

## 2021-04-26 DIAGNOSIS — E8770 Fluid overload, unspecified: Secondary | ICD-10-CM | POA: Diagnosis not present

## 2021-04-30 DIAGNOSIS — N2581 Secondary hyperparathyroidism of renal origin: Secondary | ICD-10-CM | POA: Diagnosis not present

## 2021-04-30 DIAGNOSIS — Z992 Dependence on renal dialysis: Secondary | ICD-10-CM | POA: Diagnosis not present

## 2021-04-30 DIAGNOSIS — N186 End stage renal disease: Secondary | ICD-10-CM | POA: Diagnosis not present

## 2021-04-30 DIAGNOSIS — E8770 Fluid overload, unspecified: Secondary | ICD-10-CM | POA: Diagnosis not present

## 2021-04-30 DIAGNOSIS — D509 Iron deficiency anemia, unspecified: Secondary | ICD-10-CM | POA: Diagnosis not present

## 2021-05-01 DIAGNOSIS — Z992 Dependence on renal dialysis: Secondary | ICD-10-CM | POA: Diagnosis not present

## 2021-05-01 DIAGNOSIS — D509 Iron deficiency anemia, unspecified: Secondary | ICD-10-CM | POA: Diagnosis not present

## 2021-05-01 DIAGNOSIS — E8770 Fluid overload, unspecified: Secondary | ICD-10-CM | POA: Diagnosis not present

## 2021-05-01 DIAGNOSIS — N2581 Secondary hyperparathyroidism of renal origin: Secondary | ICD-10-CM | POA: Diagnosis not present

## 2021-05-01 DIAGNOSIS — N186 End stage renal disease: Secondary | ICD-10-CM | POA: Diagnosis not present

## 2021-05-01 NOTE — Progress Notes (Addendum)
Mr. anes rigel that he is not surgery tomorrow, creatine is decreasing and the Dr. said that to wait another month. Mr. Harkin said that he had notified the office. "I want to get it rescheduled, because sometimes it takes a long time to get on the schedule."  I left  a message on the RN voice mail.

## 2021-05-02 ENCOUNTER — Ambulatory Visit (HOSPITAL_COMMUNITY): Admission: RE | Admit: 2021-05-02 | Payer: Medicare Other | Source: Home / Self Care | Admitting: Vascular Surgery

## 2021-05-02 DIAGNOSIS — I4891 Unspecified atrial fibrillation: Secondary | ICD-10-CM | POA: Diagnosis not present

## 2021-05-02 DIAGNOSIS — I2510 Atherosclerotic heart disease of native coronary artery without angina pectoris: Secondary | ICD-10-CM | POA: Diagnosis not present

## 2021-05-02 DIAGNOSIS — N186 End stage renal disease: Secondary | ICD-10-CM | POA: Diagnosis not present

## 2021-05-02 DIAGNOSIS — R1310 Dysphagia, unspecified: Secondary | ICD-10-CM | POA: Diagnosis not present

## 2021-05-02 DIAGNOSIS — I5022 Chronic systolic (congestive) heart failure: Secondary | ICD-10-CM | POA: Diagnosis not present

## 2021-05-02 DIAGNOSIS — D631 Anemia in chronic kidney disease: Secondary | ICD-10-CM | POA: Diagnosis not present

## 2021-05-02 SURGERY — ARTERIOVENOUS (AV) FISTULA CREATION
Anesthesia: Choice | Laterality: Left

## 2021-05-03 DIAGNOSIS — N2581 Secondary hyperparathyroidism of renal origin: Secondary | ICD-10-CM | POA: Diagnosis not present

## 2021-05-03 DIAGNOSIS — N186 End stage renal disease: Secondary | ICD-10-CM | POA: Diagnosis not present

## 2021-05-03 DIAGNOSIS — Z992 Dependence on renal dialysis: Secondary | ICD-10-CM | POA: Diagnosis not present

## 2021-05-03 DIAGNOSIS — E8770 Fluid overload, unspecified: Secondary | ICD-10-CM | POA: Diagnosis not present

## 2021-05-03 DIAGNOSIS — D509 Iron deficiency anemia, unspecified: Secondary | ICD-10-CM | POA: Diagnosis not present

## 2021-05-04 DIAGNOSIS — I5022 Chronic systolic (congestive) heart failure: Secondary | ICD-10-CM | POA: Diagnosis not present

## 2021-05-04 DIAGNOSIS — I483 Typical atrial flutter: Secondary | ICD-10-CM | POA: Diagnosis not present

## 2021-05-04 DIAGNOSIS — D631 Anemia in chronic kidney disease: Secondary | ICD-10-CM | POA: Diagnosis not present

## 2021-05-04 DIAGNOSIS — D692 Other nonthrombocytopenic purpura: Secondary | ICD-10-CM | POA: Diagnosis not present

## 2021-05-04 DIAGNOSIS — Z992 Dependence on renal dialysis: Secondary | ICD-10-CM | POA: Diagnosis not present

## 2021-05-04 DIAGNOSIS — N186 End stage renal disease: Secondary | ICD-10-CM | POA: Diagnosis not present

## 2021-05-07 ENCOUNTER — Encounter (HOSPITAL_COMMUNITY): Payer: Self-pay

## 2021-05-07 ENCOUNTER — Ambulatory Visit (HOSPITAL_COMMUNITY): Payer: Medicare Other

## 2021-05-07 DIAGNOSIS — E538 Deficiency of other specified B group vitamins: Secondary | ICD-10-CM | POA: Diagnosis not present

## 2021-05-07 DIAGNOSIS — I4891 Unspecified atrial fibrillation: Secondary | ICD-10-CM | POA: Diagnosis not present

## 2021-05-07 DIAGNOSIS — D631 Anemia in chronic kidney disease: Secondary | ICD-10-CM | POA: Diagnosis not present

## 2021-05-07 DIAGNOSIS — Z85820 Personal history of malignant melanoma of skin: Secondary | ICD-10-CM | POA: Diagnosis not present

## 2021-05-07 DIAGNOSIS — Z992 Dependence on renal dialysis: Secondary | ICD-10-CM | POA: Diagnosis not present

## 2021-05-07 DIAGNOSIS — N2581 Secondary hyperparathyroidism of renal origin: Secondary | ICD-10-CM | POA: Diagnosis not present

## 2021-05-07 DIAGNOSIS — Z9181 History of falling: Secondary | ICD-10-CM | POA: Diagnosis not present

## 2021-05-07 DIAGNOSIS — E8770 Fluid overload, unspecified: Secondary | ICD-10-CM | POA: Diagnosis not present

## 2021-05-07 DIAGNOSIS — D509 Iron deficiency anemia, unspecified: Secondary | ICD-10-CM | POA: Diagnosis not present

## 2021-05-07 DIAGNOSIS — R1310 Dysphagia, unspecified: Secondary | ICD-10-CM | POA: Diagnosis not present

## 2021-05-07 DIAGNOSIS — I2510 Atherosclerotic heart disease of native coronary artery without angina pectoris: Secondary | ICD-10-CM | POA: Diagnosis not present

## 2021-05-07 DIAGNOSIS — N186 End stage renal disease: Secondary | ICD-10-CM | POA: Diagnosis not present

## 2021-05-07 DIAGNOSIS — I5022 Chronic systolic (congestive) heart failure: Secondary | ICD-10-CM | POA: Diagnosis not present

## 2021-05-07 DIAGNOSIS — I959 Hypotension, unspecified: Secondary | ICD-10-CM | POA: Diagnosis not present

## 2021-05-07 DIAGNOSIS — Z8616 Personal history of COVID-19: Secondary | ICD-10-CM | POA: Diagnosis not present

## 2021-05-08 DIAGNOSIS — E8770 Fluid overload, unspecified: Secondary | ICD-10-CM | POA: Diagnosis not present

## 2021-05-08 DIAGNOSIS — N186 End stage renal disease: Secondary | ICD-10-CM | POA: Diagnosis not present

## 2021-05-08 DIAGNOSIS — D509 Iron deficiency anemia, unspecified: Secondary | ICD-10-CM | POA: Diagnosis not present

## 2021-05-08 DIAGNOSIS — N2581 Secondary hyperparathyroidism of renal origin: Secondary | ICD-10-CM | POA: Diagnosis not present

## 2021-05-08 DIAGNOSIS — Z992 Dependence on renal dialysis: Secondary | ICD-10-CM | POA: Diagnosis not present

## 2021-05-09 ENCOUNTER — Encounter (HOSPITAL_COMMUNITY): Payer: Self-pay

## 2021-05-09 ENCOUNTER — Other Ambulatory Visit: Payer: Self-pay

## 2021-05-09 ENCOUNTER — Ambulatory Visit (HOSPITAL_COMMUNITY)
Admission: RE | Admit: 2021-05-09 | Discharge: 2021-05-09 | Disposition: A | Discharge status: Home / Self Care | Payer: Medicare Other | Source: Ambulatory Visit | Attending: Gastroenterology | Admitting: Gastroenterology

## 2021-05-09 DIAGNOSIS — R1310 Dysphagia, unspecified: Secondary | ICD-10-CM | POA: Diagnosis not present

## 2021-05-09 DIAGNOSIS — R9389 Abnormal findings on diagnostic imaging of other specified body structures: Secondary | ICD-10-CM

## 2021-05-09 DIAGNOSIS — K863 Pseudocyst of pancreas: Secondary | ICD-10-CM

## 2021-05-09 DIAGNOSIS — I4891 Unspecified atrial fibrillation: Secondary | ICD-10-CM | POA: Diagnosis not present

## 2021-05-09 DIAGNOSIS — N186 End stage renal disease: Secondary | ICD-10-CM | POA: Diagnosis not present

## 2021-05-09 DIAGNOSIS — I2510 Atherosclerotic heart disease of native coronary artery without angina pectoris: Secondary | ICD-10-CM | POA: Diagnosis not present

## 2021-05-09 DIAGNOSIS — I5022 Chronic systolic (congestive) heart failure: Secondary | ICD-10-CM | POA: Diagnosis not present

## 2021-05-09 DIAGNOSIS — D631 Anemia in chronic kidney disease: Secondary | ICD-10-CM | POA: Diagnosis not present

## 2021-05-09 NOTE — Progress Notes (Signed)
Per Dr. Justin Mend patient's nephrologist he does not want to patient to IV contrast until the patient has a follow creatine to evaluate the patient's kidney's.  Radiologist did not recommend repeat the without because it is to soon and would not show much change.  Message was left with Dr. Alessandra Bevels nurse.  Explained to the patient that Dr. Justin Mend did not want him to receive the contrast at this time.  Patient and his wife stated they understand.

## 2021-05-10 ENCOUNTER — Other Ambulatory Visit: Payer: Self-pay | Admitting: Physical Medicine and Rehabilitation

## 2021-05-10 DIAGNOSIS — E8770 Fluid overload, unspecified: Secondary | ICD-10-CM | POA: Diagnosis not present

## 2021-05-10 DIAGNOSIS — N186 End stage renal disease: Secondary | ICD-10-CM | POA: Diagnosis not present

## 2021-05-10 DIAGNOSIS — D509 Iron deficiency anemia, unspecified: Secondary | ICD-10-CM | POA: Diagnosis not present

## 2021-05-10 DIAGNOSIS — N2581 Secondary hyperparathyroidism of renal origin: Secondary | ICD-10-CM | POA: Diagnosis not present

## 2021-05-10 DIAGNOSIS — Z992 Dependence on renal dialysis: Secondary | ICD-10-CM | POA: Diagnosis not present

## 2021-05-11 DIAGNOSIS — D509 Iron deficiency anemia, unspecified: Secondary | ICD-10-CM | POA: Diagnosis not present

## 2021-05-11 DIAGNOSIS — N2581 Secondary hyperparathyroidism of renal origin: Secondary | ICD-10-CM | POA: Diagnosis not present

## 2021-05-11 DIAGNOSIS — Z992 Dependence on renal dialysis: Secondary | ICD-10-CM | POA: Diagnosis not present

## 2021-05-11 DIAGNOSIS — E8770 Fluid overload, unspecified: Secondary | ICD-10-CM | POA: Diagnosis not present

## 2021-05-11 DIAGNOSIS — N186 End stage renal disease: Secondary | ICD-10-CM | POA: Diagnosis not present

## 2021-05-14 DIAGNOSIS — Z992 Dependence on renal dialysis: Secondary | ICD-10-CM | POA: Diagnosis not present

## 2021-05-14 DIAGNOSIS — N2581 Secondary hyperparathyroidism of renal origin: Secondary | ICD-10-CM | POA: Diagnosis not present

## 2021-05-14 DIAGNOSIS — D509 Iron deficiency anemia, unspecified: Secondary | ICD-10-CM | POA: Diagnosis not present

## 2021-05-14 DIAGNOSIS — N186 End stage renal disease: Secondary | ICD-10-CM | POA: Diagnosis not present

## 2021-05-14 DIAGNOSIS — I129 Hypertensive chronic kidney disease with stage 1 through stage 4 chronic kidney disease, or unspecified chronic kidney disease: Secondary | ICD-10-CM | POA: Diagnosis not present

## 2021-05-16 DIAGNOSIS — Z992 Dependence on renal dialysis: Secondary | ICD-10-CM | POA: Diagnosis not present

## 2021-05-16 DIAGNOSIS — N186 End stage renal disease: Secondary | ICD-10-CM | POA: Diagnosis not present

## 2021-05-16 DIAGNOSIS — D509 Iron deficiency anemia, unspecified: Secondary | ICD-10-CM | POA: Diagnosis not present

## 2021-05-16 DIAGNOSIS — N2581 Secondary hyperparathyroidism of renal origin: Secondary | ICD-10-CM | POA: Diagnosis not present

## 2021-05-17 DIAGNOSIS — N186 End stage renal disease: Secondary | ICD-10-CM | POA: Diagnosis not present

## 2021-05-17 DIAGNOSIS — R1310 Dysphagia, unspecified: Secondary | ICD-10-CM | POA: Diagnosis not present

## 2021-05-17 DIAGNOSIS — I5022 Chronic systolic (congestive) heart failure: Secondary | ICD-10-CM | POA: Diagnosis not present

## 2021-05-17 DIAGNOSIS — I4891 Unspecified atrial fibrillation: Secondary | ICD-10-CM | POA: Diagnosis not present

## 2021-05-17 DIAGNOSIS — D631 Anemia in chronic kidney disease: Secondary | ICD-10-CM | POA: Diagnosis not present

## 2021-05-17 DIAGNOSIS — I2510 Atherosclerotic heart disease of native coronary artery without angina pectoris: Secondary | ICD-10-CM | POA: Diagnosis not present

## 2021-05-18 DIAGNOSIS — Z992 Dependence on renal dialysis: Secondary | ICD-10-CM | POA: Diagnosis not present

## 2021-05-18 DIAGNOSIS — D509 Iron deficiency anemia, unspecified: Secondary | ICD-10-CM | POA: Diagnosis not present

## 2021-05-18 DIAGNOSIS — N2581 Secondary hyperparathyroidism of renal origin: Secondary | ICD-10-CM | POA: Diagnosis not present

## 2021-05-18 DIAGNOSIS — N186 End stage renal disease: Secondary | ICD-10-CM | POA: Diagnosis not present

## 2021-05-21 DIAGNOSIS — D509 Iron deficiency anemia, unspecified: Secondary | ICD-10-CM | POA: Diagnosis not present

## 2021-05-21 DIAGNOSIS — N186 End stage renal disease: Secondary | ICD-10-CM | POA: Diagnosis not present

## 2021-05-21 DIAGNOSIS — N2581 Secondary hyperparathyroidism of renal origin: Secondary | ICD-10-CM | POA: Diagnosis not present

## 2021-05-21 DIAGNOSIS — Z992 Dependence on renal dialysis: Secondary | ICD-10-CM | POA: Diagnosis not present

## 2021-05-22 DIAGNOSIS — I4891 Unspecified atrial fibrillation: Secondary | ICD-10-CM | POA: Diagnosis not present

## 2021-05-22 DIAGNOSIS — N186 End stage renal disease: Secondary | ICD-10-CM | POA: Diagnosis not present

## 2021-05-22 DIAGNOSIS — I5022 Chronic systolic (congestive) heart failure: Secondary | ICD-10-CM | POA: Diagnosis not present

## 2021-05-22 DIAGNOSIS — R1310 Dysphagia, unspecified: Secondary | ICD-10-CM | POA: Diagnosis not present

## 2021-05-22 DIAGNOSIS — I2510 Atherosclerotic heart disease of native coronary artery without angina pectoris: Secondary | ICD-10-CM | POA: Diagnosis not present

## 2021-05-22 DIAGNOSIS — D631 Anemia in chronic kidney disease: Secondary | ICD-10-CM | POA: Diagnosis not present

## 2021-05-23 DIAGNOSIS — Z992 Dependence on renal dialysis: Secondary | ICD-10-CM | POA: Diagnosis not present

## 2021-05-23 DIAGNOSIS — N2581 Secondary hyperparathyroidism of renal origin: Secondary | ICD-10-CM | POA: Diagnosis not present

## 2021-05-23 DIAGNOSIS — N186 End stage renal disease: Secondary | ICD-10-CM | POA: Diagnosis not present

## 2021-05-23 DIAGNOSIS — D509 Iron deficiency anemia, unspecified: Secondary | ICD-10-CM | POA: Diagnosis not present

## 2021-05-24 ENCOUNTER — Other Ambulatory Visit: Payer: Self-pay

## 2021-05-24 ENCOUNTER — Non-Acute Institutional Stay (HOSPITAL_COMMUNITY)
Admission: RE | Admit: 2021-05-24 | Discharge: 2021-05-24 | Disposition: A | Discharge status: Home / Self Care | Payer: Medicare Other | Source: Ambulatory Visit | Attending: Internal Medicine | Admitting: Internal Medicine

## 2021-05-24 DIAGNOSIS — N189 Chronic kidney disease, unspecified: Secondary | ICD-10-CM | POA: Insufficient documentation

## 2021-05-24 DIAGNOSIS — D631 Anemia in chronic kidney disease: Secondary | ICD-10-CM | POA: Diagnosis not present

## 2021-05-24 LAB — PREPARE RBC (CROSSMATCH)

## 2021-05-24 MED ORDER — SODIUM CHLORIDE 0.9% IV SOLUTION: Freq: Once | INTRAVENOUS | Status: AC

## 2021-05-24 NOTE — Progress Notes (Addendum)
Patient received via PIV 1 unit of packed red blood cells as ordered by Edrick Oh MD. Consent for blood products obtained and side effects reviewed with pt, verbalized understanding. Type and screen was done before transfusion. No premedications required per orders. Pre transfusion Hgb 7.1 from OSH. Tolerated transfusion well, vitals stable, discharge instructions given, verbalized understanding. Patient alert, oriented and ambulatory with rolling walker at the time of discharge, pt accompanied by caretaker [facility].

## 2021-05-25 DIAGNOSIS — N186 End stage renal disease: Secondary | ICD-10-CM | POA: Diagnosis not present

## 2021-05-25 DIAGNOSIS — N2581 Secondary hyperparathyroidism of renal origin: Secondary | ICD-10-CM | POA: Diagnosis not present

## 2021-05-25 DIAGNOSIS — D509 Iron deficiency anemia, unspecified: Secondary | ICD-10-CM | POA: Diagnosis not present

## 2021-05-25 DIAGNOSIS — Z992 Dependence on renal dialysis: Secondary | ICD-10-CM | POA: Diagnosis not present

## 2021-05-25 LAB — BPAM RBC
Blood Product Expiration Date: 202209142359
ISSUE DATE / TIME: 202208111057
Unit Type and Rh: 5100

## 2021-05-25 LAB — TYPE AND SCREEN
ABO/RH(D): O POS
Antibody Screen: NEGATIVE
Unit division: 0

## 2021-05-28 DIAGNOSIS — N186 End stage renal disease: Secondary | ICD-10-CM | POA: Diagnosis not present

## 2021-05-28 DIAGNOSIS — N2581 Secondary hyperparathyroidism of renal origin: Secondary | ICD-10-CM | POA: Diagnosis not present

## 2021-05-28 DIAGNOSIS — Z992 Dependence on renal dialysis: Secondary | ICD-10-CM | POA: Diagnosis not present

## 2021-05-28 DIAGNOSIS — D509 Iron deficiency anemia, unspecified: Secondary | ICD-10-CM | POA: Diagnosis not present

## 2021-05-29 DIAGNOSIS — R1310 Dysphagia, unspecified: Secondary | ICD-10-CM | POA: Diagnosis not present

## 2021-05-29 DIAGNOSIS — I2510 Atherosclerotic heart disease of native coronary artery without angina pectoris: Secondary | ICD-10-CM | POA: Diagnosis not present

## 2021-05-29 DIAGNOSIS — N186 End stage renal disease: Secondary | ICD-10-CM | POA: Diagnosis not present

## 2021-05-29 DIAGNOSIS — D631 Anemia in chronic kidney disease: Secondary | ICD-10-CM | POA: Diagnosis not present

## 2021-05-29 DIAGNOSIS — I4891 Unspecified atrial fibrillation: Secondary | ICD-10-CM | POA: Diagnosis not present

## 2021-05-29 DIAGNOSIS — I5022 Chronic systolic (congestive) heart failure: Secondary | ICD-10-CM | POA: Diagnosis not present

## 2021-05-30 DIAGNOSIS — D509 Iron deficiency anemia, unspecified: Secondary | ICD-10-CM | POA: Diagnosis not present

## 2021-05-30 DIAGNOSIS — Z992 Dependence on renal dialysis: Secondary | ICD-10-CM | POA: Diagnosis not present

## 2021-05-30 DIAGNOSIS — N186 End stage renal disease: Secondary | ICD-10-CM | POA: Diagnosis not present

## 2021-05-30 DIAGNOSIS — N2581 Secondary hyperparathyroidism of renal origin: Secondary | ICD-10-CM | POA: Diagnosis not present

## 2021-06-01 DIAGNOSIS — Z992 Dependence on renal dialysis: Secondary | ICD-10-CM | POA: Diagnosis not present

## 2021-06-01 DIAGNOSIS — N186 End stage renal disease: Secondary | ICD-10-CM | POA: Diagnosis not present

## 2021-06-01 DIAGNOSIS — N2581 Secondary hyperparathyroidism of renal origin: Secondary | ICD-10-CM | POA: Diagnosis not present

## 2021-06-01 DIAGNOSIS — D509 Iron deficiency anemia, unspecified: Secondary | ICD-10-CM | POA: Diagnosis not present

## 2021-06-04 DIAGNOSIS — N2581 Secondary hyperparathyroidism of renal origin: Secondary | ICD-10-CM | POA: Diagnosis not present

## 2021-06-04 DIAGNOSIS — N186 End stage renal disease: Secondary | ICD-10-CM | POA: Diagnosis not present

## 2021-06-04 DIAGNOSIS — Z992 Dependence on renal dialysis: Secondary | ICD-10-CM | POA: Diagnosis not present

## 2021-06-04 DIAGNOSIS — D509 Iron deficiency anemia, unspecified: Secondary | ICD-10-CM | POA: Diagnosis not present

## 2021-06-05 DIAGNOSIS — N186 End stage renal disease: Secondary | ICD-10-CM | POA: Diagnosis not present

## 2021-06-05 DIAGNOSIS — R1310 Dysphagia, unspecified: Secondary | ICD-10-CM | POA: Diagnosis not present

## 2021-06-05 DIAGNOSIS — I4891 Unspecified atrial fibrillation: Secondary | ICD-10-CM | POA: Diagnosis not present

## 2021-06-05 DIAGNOSIS — I2510 Atherosclerotic heart disease of native coronary artery without angina pectoris: Secondary | ICD-10-CM | POA: Diagnosis not present

## 2021-06-05 DIAGNOSIS — I5022 Chronic systolic (congestive) heart failure: Secondary | ICD-10-CM | POA: Diagnosis not present

## 2021-06-05 DIAGNOSIS — D631 Anemia in chronic kidney disease: Secondary | ICD-10-CM | POA: Diagnosis not present

## 2021-06-06 DIAGNOSIS — D509 Iron deficiency anemia, unspecified: Secondary | ICD-10-CM | POA: Diagnosis not present

## 2021-06-06 DIAGNOSIS — N186 End stage renal disease: Secondary | ICD-10-CM | POA: Diagnosis not present

## 2021-06-06 DIAGNOSIS — Z992 Dependence on renal dialysis: Secondary | ICD-10-CM | POA: Diagnosis not present

## 2021-06-06 DIAGNOSIS — N2581 Secondary hyperparathyroidism of renal origin: Secondary | ICD-10-CM | POA: Diagnosis not present

## 2021-06-08 DIAGNOSIS — Z992 Dependence on renal dialysis: Secondary | ICD-10-CM | POA: Diagnosis not present

## 2021-06-08 DIAGNOSIS — N186 End stage renal disease: Secondary | ICD-10-CM | POA: Diagnosis not present

## 2021-06-08 DIAGNOSIS — D509 Iron deficiency anemia, unspecified: Secondary | ICD-10-CM | POA: Diagnosis not present

## 2021-06-08 DIAGNOSIS — N2581 Secondary hyperparathyroidism of renal origin: Secondary | ICD-10-CM | POA: Diagnosis not present

## 2021-06-11 DIAGNOSIS — D509 Iron deficiency anemia, unspecified: Secondary | ICD-10-CM | POA: Diagnosis not present

## 2021-06-11 DIAGNOSIS — N2581 Secondary hyperparathyroidism of renal origin: Secondary | ICD-10-CM | POA: Diagnosis not present

## 2021-06-11 DIAGNOSIS — N186 End stage renal disease: Secondary | ICD-10-CM | POA: Diagnosis not present

## 2021-06-11 DIAGNOSIS — Z992 Dependence on renal dialysis: Secondary | ICD-10-CM | POA: Diagnosis not present

## 2021-06-13 DIAGNOSIS — Z992 Dependence on renal dialysis: Secondary | ICD-10-CM | POA: Diagnosis not present

## 2021-06-13 DIAGNOSIS — D509 Iron deficiency anemia, unspecified: Secondary | ICD-10-CM | POA: Diagnosis not present

## 2021-06-13 DIAGNOSIS — N2581 Secondary hyperparathyroidism of renal origin: Secondary | ICD-10-CM | POA: Diagnosis not present

## 2021-06-13 DIAGNOSIS — N186 End stage renal disease: Secondary | ICD-10-CM | POA: Diagnosis not present

## 2021-06-14 DIAGNOSIS — Z992 Dependence on renal dialysis: Secondary | ICD-10-CM | POA: Diagnosis not present

## 2021-06-14 DIAGNOSIS — N186 End stage renal disease: Secondary | ICD-10-CM | POA: Diagnosis not present

## 2021-06-14 DIAGNOSIS — I129 Hypertensive chronic kidney disease with stage 1 through stage 4 chronic kidney disease, or unspecified chronic kidney disease: Secondary | ICD-10-CM | POA: Diagnosis not present

## 2021-06-15 DIAGNOSIS — D509 Iron deficiency anemia, unspecified: Secondary | ICD-10-CM | POA: Diagnosis not present

## 2021-06-15 DIAGNOSIS — Z992 Dependence on renal dialysis: Secondary | ICD-10-CM | POA: Diagnosis not present

## 2021-06-15 DIAGNOSIS — N186 End stage renal disease: Secondary | ICD-10-CM | POA: Diagnosis not present

## 2021-06-15 DIAGNOSIS — D631 Anemia in chronic kidney disease: Secondary | ICD-10-CM | POA: Diagnosis not present

## 2021-06-15 DIAGNOSIS — N2581 Secondary hyperparathyroidism of renal origin: Secondary | ICD-10-CM | POA: Diagnosis not present

## 2021-06-18 DIAGNOSIS — D631 Anemia in chronic kidney disease: Secondary | ICD-10-CM | POA: Diagnosis not present

## 2021-06-18 DIAGNOSIS — N2581 Secondary hyperparathyroidism of renal origin: Secondary | ICD-10-CM | POA: Diagnosis not present

## 2021-06-18 DIAGNOSIS — D509 Iron deficiency anemia, unspecified: Secondary | ICD-10-CM | POA: Diagnosis not present

## 2021-06-18 DIAGNOSIS — N186 End stage renal disease: Secondary | ICD-10-CM | POA: Diagnosis not present

## 2021-06-18 DIAGNOSIS — Z992 Dependence on renal dialysis: Secondary | ICD-10-CM | POA: Diagnosis not present

## 2021-06-19 DIAGNOSIS — L812 Freckles: Secondary | ICD-10-CM | POA: Diagnosis not present

## 2021-06-19 DIAGNOSIS — L720 Epidermal cyst: Secondary | ICD-10-CM | POA: Diagnosis not present

## 2021-06-19 DIAGNOSIS — D1801 Hemangioma of skin and subcutaneous tissue: Secondary | ICD-10-CM | POA: Diagnosis not present

## 2021-06-19 DIAGNOSIS — Z85820 Personal history of malignant melanoma of skin: Secondary | ICD-10-CM | POA: Diagnosis not present

## 2021-06-19 DIAGNOSIS — Z85828 Personal history of other malignant neoplasm of skin: Secondary | ICD-10-CM | POA: Diagnosis not present

## 2021-06-19 DIAGNOSIS — D485 Neoplasm of uncertain behavior of skin: Secondary | ICD-10-CM | POA: Diagnosis not present

## 2021-06-19 DIAGNOSIS — L570 Actinic keratosis: Secondary | ICD-10-CM | POA: Diagnosis not present

## 2021-06-19 DIAGNOSIS — C44311 Basal cell carcinoma of skin of nose: Secondary | ICD-10-CM | POA: Diagnosis not present

## 2021-06-19 DIAGNOSIS — L821 Other seborrheic keratosis: Secondary | ICD-10-CM | POA: Diagnosis not present

## 2021-06-19 DIAGNOSIS — D044 Carcinoma in situ of skin of scalp and neck: Secondary | ICD-10-CM | POA: Diagnosis not present

## 2021-06-19 DIAGNOSIS — C4442 Squamous cell carcinoma of skin of scalp and neck: Secondary | ICD-10-CM | POA: Diagnosis not present

## 2021-06-20 DIAGNOSIS — N2581 Secondary hyperparathyroidism of renal origin: Secondary | ICD-10-CM | POA: Diagnosis not present

## 2021-06-20 DIAGNOSIS — Z992 Dependence on renal dialysis: Secondary | ICD-10-CM | POA: Diagnosis not present

## 2021-06-20 DIAGNOSIS — D509 Iron deficiency anemia, unspecified: Secondary | ICD-10-CM | POA: Diagnosis not present

## 2021-06-20 DIAGNOSIS — N186 End stage renal disease: Secondary | ICD-10-CM | POA: Diagnosis not present

## 2021-06-20 DIAGNOSIS — D631 Anemia in chronic kidney disease: Secondary | ICD-10-CM | POA: Diagnosis not present

## 2021-06-22 DIAGNOSIS — D631 Anemia in chronic kidney disease: Secondary | ICD-10-CM | POA: Diagnosis not present

## 2021-06-22 DIAGNOSIS — D509 Iron deficiency anemia, unspecified: Secondary | ICD-10-CM | POA: Diagnosis not present

## 2021-06-22 DIAGNOSIS — N186 End stage renal disease: Secondary | ICD-10-CM | POA: Diagnosis not present

## 2021-06-22 DIAGNOSIS — N2581 Secondary hyperparathyroidism of renal origin: Secondary | ICD-10-CM | POA: Diagnosis not present

## 2021-06-22 DIAGNOSIS — Z992 Dependence on renal dialysis: Secondary | ICD-10-CM | POA: Diagnosis not present

## 2021-06-25 DIAGNOSIS — Z992 Dependence on renal dialysis: Secondary | ICD-10-CM | POA: Diagnosis not present

## 2021-06-25 DIAGNOSIS — D509 Iron deficiency anemia, unspecified: Secondary | ICD-10-CM | POA: Diagnosis not present

## 2021-06-25 DIAGNOSIS — D631 Anemia in chronic kidney disease: Secondary | ICD-10-CM | POA: Diagnosis not present

## 2021-06-25 DIAGNOSIS — N2581 Secondary hyperparathyroidism of renal origin: Secondary | ICD-10-CM | POA: Diagnosis not present

## 2021-06-25 DIAGNOSIS — N186 End stage renal disease: Secondary | ICD-10-CM | POA: Diagnosis not present

## 2021-06-26 DIAGNOSIS — I5022 Chronic systolic (congestive) heart failure: Secondary | ICD-10-CM | POA: Diagnosis not present

## 2021-06-26 DIAGNOSIS — D631 Anemia in chronic kidney disease: Secondary | ICD-10-CM | POA: Diagnosis not present

## 2021-06-26 DIAGNOSIS — N186 End stage renal disease: Secondary | ICD-10-CM | POA: Diagnosis not present

## 2021-06-27 DIAGNOSIS — D631 Anemia in chronic kidney disease: Secondary | ICD-10-CM | POA: Diagnosis not present

## 2021-06-27 DIAGNOSIS — N186 End stage renal disease: Secondary | ICD-10-CM | POA: Diagnosis not present

## 2021-06-27 DIAGNOSIS — D509 Iron deficiency anemia, unspecified: Secondary | ICD-10-CM | POA: Diagnosis not present

## 2021-06-27 DIAGNOSIS — N2581 Secondary hyperparathyroidism of renal origin: Secondary | ICD-10-CM | POA: Diagnosis not present

## 2021-06-27 DIAGNOSIS — Z992 Dependence on renal dialysis: Secondary | ICD-10-CM | POA: Diagnosis not present

## 2021-06-28 DIAGNOSIS — N2581 Secondary hyperparathyroidism of renal origin: Secondary | ICD-10-CM | POA: Diagnosis not present

## 2021-06-28 DIAGNOSIS — D631 Anemia in chronic kidney disease: Secondary | ICD-10-CM | POA: Diagnosis not present

## 2021-06-28 DIAGNOSIS — N186 End stage renal disease: Secondary | ICD-10-CM | POA: Diagnosis not present

## 2021-06-28 DIAGNOSIS — D509 Iron deficiency anemia, unspecified: Secondary | ICD-10-CM | POA: Diagnosis not present

## 2021-06-28 DIAGNOSIS — Z992 Dependence on renal dialysis: Secondary | ICD-10-CM | POA: Diagnosis not present

## 2021-06-29 DIAGNOSIS — N2581 Secondary hyperparathyroidism of renal origin: Secondary | ICD-10-CM | POA: Diagnosis not present

## 2021-06-29 DIAGNOSIS — D509 Iron deficiency anemia, unspecified: Secondary | ICD-10-CM | POA: Diagnosis not present

## 2021-06-29 DIAGNOSIS — N186 End stage renal disease: Secondary | ICD-10-CM | POA: Diagnosis not present

## 2021-06-29 DIAGNOSIS — D631 Anemia in chronic kidney disease: Secondary | ICD-10-CM | POA: Diagnosis not present

## 2021-06-29 DIAGNOSIS — Z992 Dependence on renal dialysis: Secondary | ICD-10-CM | POA: Diagnosis not present

## 2021-07-02 DIAGNOSIS — Z992 Dependence on renal dialysis: Secondary | ICD-10-CM | POA: Diagnosis not present

## 2021-07-02 DIAGNOSIS — N2581 Secondary hyperparathyroidism of renal origin: Secondary | ICD-10-CM | POA: Diagnosis not present

## 2021-07-02 DIAGNOSIS — N186 End stage renal disease: Secondary | ICD-10-CM | POA: Diagnosis not present

## 2021-07-02 DIAGNOSIS — D631 Anemia in chronic kidney disease: Secondary | ICD-10-CM | POA: Diagnosis not present

## 2021-07-02 DIAGNOSIS — D509 Iron deficiency anemia, unspecified: Secondary | ICD-10-CM | POA: Diagnosis not present

## 2021-07-04 DIAGNOSIS — N186 End stage renal disease: Secondary | ICD-10-CM | POA: Diagnosis not present

## 2021-07-04 DIAGNOSIS — D509 Iron deficiency anemia, unspecified: Secondary | ICD-10-CM | POA: Diagnosis not present

## 2021-07-04 DIAGNOSIS — N2581 Secondary hyperparathyroidism of renal origin: Secondary | ICD-10-CM | POA: Diagnosis not present

## 2021-07-04 DIAGNOSIS — Z992 Dependence on renal dialysis: Secondary | ICD-10-CM | POA: Diagnosis not present

## 2021-07-04 DIAGNOSIS — D631 Anemia in chronic kidney disease: Secondary | ICD-10-CM | POA: Diagnosis not present

## 2021-07-05 DIAGNOSIS — K863 Pseudocyst of pancreas: Secondary | ICD-10-CM | POA: Diagnosis not present

## 2021-07-05 DIAGNOSIS — D649 Anemia, unspecified: Secondary | ICD-10-CM | POA: Diagnosis not present

## 2021-07-05 DIAGNOSIS — K5900 Constipation, unspecified: Secondary | ICD-10-CM | POA: Diagnosis not present

## 2021-07-06 DIAGNOSIS — D631 Anemia in chronic kidney disease: Secondary | ICD-10-CM | POA: Diagnosis not present

## 2021-07-06 DIAGNOSIS — N186 End stage renal disease: Secondary | ICD-10-CM | POA: Diagnosis not present

## 2021-07-06 DIAGNOSIS — N2581 Secondary hyperparathyroidism of renal origin: Secondary | ICD-10-CM | POA: Diagnosis not present

## 2021-07-06 DIAGNOSIS — Z992 Dependence on renal dialysis: Secondary | ICD-10-CM | POA: Diagnosis not present

## 2021-07-06 DIAGNOSIS — D509 Iron deficiency anemia, unspecified: Secondary | ICD-10-CM | POA: Diagnosis not present

## 2021-07-09 DIAGNOSIS — D631 Anemia in chronic kidney disease: Secondary | ICD-10-CM | POA: Diagnosis not present

## 2021-07-09 DIAGNOSIS — N2581 Secondary hyperparathyroidism of renal origin: Secondary | ICD-10-CM | POA: Diagnosis not present

## 2021-07-09 DIAGNOSIS — Z992 Dependence on renal dialysis: Secondary | ICD-10-CM | POA: Diagnosis not present

## 2021-07-09 DIAGNOSIS — D509 Iron deficiency anemia, unspecified: Secondary | ICD-10-CM | POA: Diagnosis not present

## 2021-07-09 DIAGNOSIS — N186 End stage renal disease: Secondary | ICD-10-CM | POA: Diagnosis not present

## 2021-07-11 DIAGNOSIS — D509 Iron deficiency anemia, unspecified: Secondary | ICD-10-CM | POA: Diagnosis not present

## 2021-07-11 DIAGNOSIS — D631 Anemia in chronic kidney disease: Secondary | ICD-10-CM | POA: Diagnosis not present

## 2021-07-11 DIAGNOSIS — Z992 Dependence on renal dialysis: Secondary | ICD-10-CM | POA: Diagnosis not present

## 2021-07-11 DIAGNOSIS — N2581 Secondary hyperparathyroidism of renal origin: Secondary | ICD-10-CM | POA: Diagnosis not present

## 2021-07-11 DIAGNOSIS — N186 End stage renal disease: Secondary | ICD-10-CM | POA: Diagnosis not present

## 2021-07-12 ENCOUNTER — Other Ambulatory Visit (HOSPITAL_BASED_OUTPATIENT_CLINIC_OR_DEPARTMENT_OTHER): Payer: Self-pay

## 2021-07-12 ENCOUNTER — Ambulatory Visit: Payer: Medicare Other | Attending: Internal Medicine

## 2021-07-12 DIAGNOSIS — D649 Anemia, unspecified: Secondary | ICD-10-CM | POA: Diagnosis not present

## 2021-07-12 DIAGNOSIS — D631 Anemia in chronic kidney disease: Secondary | ICD-10-CM | POA: Diagnosis not present

## 2021-07-12 DIAGNOSIS — Z992 Dependence on renal dialysis: Secondary | ICD-10-CM | POA: Diagnosis not present

## 2021-07-12 DIAGNOSIS — I5022 Chronic systolic (congestive) heart failure: Secondary | ICD-10-CM | POA: Diagnosis not present

## 2021-07-12 DIAGNOSIS — N186 End stage renal disease: Secondary | ICD-10-CM | POA: Diagnosis not present

## 2021-07-12 DIAGNOSIS — Z23 Encounter for immunization: Secondary | ICD-10-CM | POA: Diagnosis not present

## 2021-07-12 DIAGNOSIS — I483 Typical atrial flutter: Secondary | ICD-10-CM | POA: Diagnosis not present

## 2021-07-12 MED ORDER — MODERNA COVID-19 BIVAL BOOSTER 50 MCG/0.5ML IM SUSP
INTRAMUSCULAR | 0 refills | Status: DC
  Filled 2021-07-12: qty 0.5, 1d supply, fill #0

## 2021-07-12 NOTE — Progress Notes (Signed)
   [id number]  Name:  Raymond Hurley.    MRN: [id number] DOB: [birth year]  07/12/2021  Raymond Hurley was observed post Covid-19 immunization for 15 minutes without incident. He was provided with Vaccine Information Sheet and instruction to access the V-Safe system.   Raymond Hurley was instructed to call 911 with any severe reactions post vaccine: Difficulty breathing  Swelling of face and throat  A fast heartbeat  A bad rash all over body  Dizziness and weakness

## 2021-07-13 DIAGNOSIS — Z992 Dependence on renal dialysis: Secondary | ICD-10-CM | POA: Diagnosis not present

## 2021-07-13 DIAGNOSIS — N186 End stage renal disease: Secondary | ICD-10-CM | POA: Diagnosis not present

## 2021-07-13 DIAGNOSIS — N2581 Secondary hyperparathyroidism of renal origin: Secondary | ICD-10-CM | POA: Diagnosis not present

## 2021-07-13 DIAGNOSIS — D509 Iron deficiency anemia, unspecified: Secondary | ICD-10-CM | POA: Diagnosis not present

## 2021-07-13 DIAGNOSIS — D631 Anemia in chronic kidney disease: Secondary | ICD-10-CM | POA: Diagnosis not present

## 2021-07-14 DIAGNOSIS — N186 End stage renal disease: Secondary | ICD-10-CM | POA: Diagnosis not present

## 2021-07-14 DIAGNOSIS — I129 Hypertensive chronic kidney disease with stage 1 through stage 4 chronic kidney disease, or unspecified chronic kidney disease: Secondary | ICD-10-CM | POA: Diagnosis not present

## 2021-07-14 DIAGNOSIS — Z992 Dependence on renal dialysis: Secondary | ICD-10-CM | POA: Diagnosis not present

## 2021-07-16 DIAGNOSIS — Z992 Dependence on renal dialysis: Secondary | ICD-10-CM | POA: Diagnosis not present

## 2021-07-16 DIAGNOSIS — N2581 Secondary hyperparathyroidism of renal origin: Secondary | ICD-10-CM | POA: Diagnosis not present

## 2021-07-16 DIAGNOSIS — N186 End stage renal disease: Secondary | ICD-10-CM | POA: Diagnosis not present

## 2021-07-16 DIAGNOSIS — D631 Anemia in chronic kidney disease: Secondary | ICD-10-CM | POA: Diagnosis not present

## 2021-07-17 DIAGNOSIS — Z85820 Personal history of malignant melanoma of skin: Secondary | ICD-10-CM | POA: Diagnosis not present

## 2021-07-17 DIAGNOSIS — Z85828 Personal history of other malignant neoplasm of skin: Secondary | ICD-10-CM | POA: Diagnosis not present

## 2021-07-17 DIAGNOSIS — C4442 Squamous cell carcinoma of skin of scalp and neck: Secondary | ICD-10-CM | POA: Diagnosis not present

## 2021-07-20 DIAGNOSIS — N186 End stage renal disease: Secondary | ICD-10-CM | POA: Diagnosis not present

## 2021-07-20 DIAGNOSIS — N2581 Secondary hyperparathyroidism of renal origin: Secondary | ICD-10-CM | POA: Diagnosis not present

## 2021-07-20 DIAGNOSIS — Z992 Dependence on renal dialysis: Secondary | ICD-10-CM | POA: Diagnosis not present

## 2021-07-20 DIAGNOSIS — D631 Anemia in chronic kidney disease: Secondary | ICD-10-CM | POA: Diagnosis not present

## 2021-07-23 ENCOUNTER — Encounter (HOSPITAL_COMMUNITY): Payer: Self-pay

## 2021-07-23 ENCOUNTER — Emergency Department (HOSPITAL_COMMUNITY)
Admission: EM | Admit: 2021-07-23 | Discharge: 2021-07-23 | Disposition: A | Discharge status: Home / Self Care | Payer: Medicare Other | Attending: Emergency Medicine | Admitting: Emergency Medicine

## 2021-07-23 ENCOUNTER — Other Ambulatory Visit: Payer: Self-pay

## 2021-07-23 DIAGNOSIS — N186 End stage renal disease: Secondary | ICD-10-CM | POA: Diagnosis not present

## 2021-07-23 DIAGNOSIS — Z992 Dependence on renal dialysis: Secondary | ICD-10-CM | POA: Diagnosis not present

## 2021-07-23 DIAGNOSIS — D631 Anemia in chronic kidney disease: Secondary | ICD-10-CM | POA: Diagnosis not present

## 2021-07-23 DIAGNOSIS — R799 Abnormal finding of blood chemistry, unspecified: Secondary | ICD-10-CM | POA: Insufficient documentation

## 2021-07-23 DIAGNOSIS — R791 Abnormal coagulation profile: Secondary | ICD-10-CM | POA: Insufficient documentation

## 2021-07-23 DIAGNOSIS — N2581 Secondary hyperparathyroidism of renal origin: Secondary | ICD-10-CM | POA: Diagnosis not present

## 2021-07-23 DIAGNOSIS — D649 Anemia, unspecified: Secondary | ICD-10-CM | POA: Diagnosis not present

## 2021-07-23 DIAGNOSIS — Z5321 Procedure and treatment not carried out due to patient leaving prior to being seen by health care provider: Secondary | ICD-10-CM | POA: Insufficient documentation

## 2021-07-23 LAB — CBC WITH DIFFERENTIAL/PLATELET
Abs Immature Granulocytes: 0.04 10*3/uL (ref 0.00–0.07)
Basophils Absolute: 0 10*3/uL (ref 0.0–0.1)
Basophils Relative: 1 %
Eosinophils Absolute: 0.1 10*3/uL (ref 0.0–0.5)
Eosinophils Relative: 2 %
HCT: 22 % — ABNORMAL LOW (ref 39.0–52.0)
Hemoglobin: 7 g/dL — ABNORMAL LOW (ref 13.0–17.0)
Immature Granulocytes: 1 %
Lymphocytes Relative: 30 %
Lymphs Abs: 1 10*3/uL (ref 0.7–4.0)
MCH: 35.9 pg — ABNORMAL HIGH (ref 26.0–34.0)
MCHC: 31.8 g/dL (ref 30.0–36.0)
MCV: 112.8 fL — ABNORMAL HIGH (ref 80.0–100.0)
Monocytes Absolute: 0.7 10*3/uL (ref 0.1–1.0)
Monocytes Relative: 21 %
Neutro Abs: 1.5 10*3/uL — ABNORMAL LOW (ref 1.7–7.7)
Neutrophils Relative %: 45 %
Platelets: 256 10*3/uL (ref 150–400)
RBC: 1.95 MIL/uL — ABNORMAL LOW (ref 4.22–5.81)
RDW: 15.2 % (ref 11.5–15.5)
WBC: 3.2 10*3/uL — ABNORMAL LOW (ref 4.0–10.5)
nRBC: 0 % (ref 0.0–0.2)

## 2021-07-23 LAB — COMPREHENSIVE METABOLIC PANEL
ALT: 18 U/L (ref 0–44)
AST: 23 U/L (ref 15–41)
Albumin: 3.5 g/dL (ref 3.5–5.0)
Alkaline Phosphatase: 65 U/L (ref 38–126)
Anion gap: 8 (ref 5–15)
BUN: 10 mg/dL (ref 8–23)
CO2: 29 mmol/L (ref 22–32)
Calcium: 9.3 mg/dL (ref 8.9–10.3)
Chloride: 96 mmol/L — ABNORMAL LOW (ref 98–111)
Creatinine, Ser: 2.31 mg/dL — ABNORMAL HIGH (ref 0.61–1.24)
GFR, Estimated: 27 mL/min — ABNORMAL LOW (ref >60)
Glucose, Bld: 115 mg/dL — ABNORMAL HIGH (ref 70–99)
Potassium: 3.7 mmol/L (ref 3.5–5.1)
Sodium: 133 mmol/L — ABNORMAL LOW (ref 135–145)
Total Bilirubin: 0.8 mg/dL (ref 0.3–1.2)
Total Protein: 6.5 g/dL (ref 6.5–8.1)

## 2021-07-23 LAB — TYPE AND SCREEN
ABO/RH(D): O POS
Antibody Screen: NEGATIVE

## 2021-07-23 LAB — PROTIME-INR
INR: 1 (ref 0.8–1.2)
Prothrombin Time: 12.8 seconds (ref 11.4–15.2)

## 2021-07-23 NOTE — ED Notes (Signed)
Pt stated that he will come back another day this week.

## 2021-07-23 NOTE — ED Triage Notes (Signed)
Had dialysis and Dr webb sent pt to ER for blood transfusion due to hgb 6.6

## 2021-07-23 NOTE — ED Provider Notes (Signed)
Emergency Medicine Provider Triage Evaluation Note  [name]. , a 87 y.o. male  was evaluated in triage.  Pt complains of no symptoms at all.  States that he feels well but was told by dialysis center that his hemoglobin was 6.6 Dr. Justin Mend informed him that he would need a transfusion.  Denies any chest pain or shortness of breath no lightheadedness or dizziness.  Review of Systems  Positive: Anemia/abnormal lab Negative: Lightheaded dizzy  Physical Exam  BP 125/63 (BP Location: Right Arm)   Pulse 68   Resp 16   Ht 5\' 10"  (1.778 m)   Wt 77.1 kg   SpO2 100%   BMI 24.39 kg/m  Gen:   Awake, no distress   Resp:  Normal effort  MSK:   Moves extremities without difficulty  Other:  Somewhat pale  Medical Decision Making  Medically screening exam initiated at 4:45 PM.  Appropriate orders placed.  [name]. was informed that the remainder of the evaluation will be completed by another provider, this initial triage assessment does not replace that evaluation, and the importance of remaining in the ED until their evaluation is complete.  Patient apparently quite anemic.  Will obtain labs including type and screen.  Anticipate transfusion.   Pati Gallo [place], [place] 07/23/21 1655    Sherwood Gambler, MD 07/24/21 2226

## 2021-07-24 DIAGNOSIS — C439 Malignant melanoma of skin, unspecified: Secondary | ICD-10-CM | POA: Diagnosis not present

## 2021-07-25 DIAGNOSIS — D696 Thrombocytopenia, unspecified: Secondary | ICD-10-CM | POA: Diagnosis not present

## 2021-07-25 DIAGNOSIS — C434 Malignant melanoma of scalp and neck: Secondary | ICD-10-CM | POA: Diagnosis not present

## 2021-07-25 DIAGNOSIS — Z8616 Personal history of COVID-19: Secondary | ICD-10-CM | POA: Diagnosis not present

## 2021-07-25 DIAGNOSIS — C439 Malignant melanoma of skin, unspecified: Secondary | ICD-10-CM | POA: Diagnosis not present

## 2021-07-25 DIAGNOSIS — K922 Gastrointestinal hemorrhage, unspecified: Secondary | ICD-10-CM | POA: Diagnosis not present

## 2021-07-25 DIAGNOSIS — I2699 Other pulmonary embolism without acute cor pulmonale: Secondary | ICD-10-CM | POA: Diagnosis not present

## 2021-07-25 DIAGNOSIS — D630 Anemia in neoplastic disease: Secondary | ICD-10-CM | POA: Diagnosis not present

## 2021-07-25 DIAGNOSIS — C772 Secondary and unspecified malignant neoplasm of intra-abdominal lymph nodes: Secondary | ICD-10-CM | POA: Diagnosis not present

## 2021-07-25 DIAGNOSIS — E538 Deficiency of other specified B group vitamins: Secondary | ICD-10-CM | POA: Diagnosis not present

## 2021-07-25 DIAGNOSIS — Z992 Dependence on renal dialysis: Secondary | ICD-10-CM | POA: Diagnosis not present

## 2021-07-25 DIAGNOSIS — C770 Secondary and unspecified malignant neoplasm of lymph nodes of head, face and neck: Secondary | ICD-10-CM | POA: Diagnosis not present

## 2021-07-25 DIAGNOSIS — K859 Acute pancreatitis without necrosis or infection, unspecified: Secondary | ICD-10-CM | POA: Diagnosis not present

## 2021-07-26 DIAGNOSIS — C4339 Malignant melanoma of other parts of face: Secondary | ICD-10-CM | POA: Diagnosis not present

## 2021-07-26 DIAGNOSIS — C439 Malignant melanoma of skin, unspecified: Secondary | ICD-10-CM | POA: Diagnosis not present

## 2021-07-27 DIAGNOSIS — Z992 Dependence on renal dialysis: Secondary | ICD-10-CM | POA: Diagnosis not present

## 2021-07-27 DIAGNOSIS — N2581 Secondary hyperparathyroidism of renal origin: Secondary | ICD-10-CM | POA: Diagnosis not present

## 2021-07-27 DIAGNOSIS — D631 Anemia in chronic kidney disease: Secondary | ICD-10-CM | POA: Diagnosis not present

## 2021-07-27 DIAGNOSIS — N186 End stage renal disease: Secondary | ICD-10-CM | POA: Diagnosis not present

## 2021-07-30 DIAGNOSIS — D631 Anemia in chronic kidney disease: Secondary | ICD-10-CM | POA: Diagnosis not present

## 2021-07-30 DIAGNOSIS — Z992 Dependence on renal dialysis: Secondary | ICD-10-CM | POA: Diagnosis not present

## 2021-07-30 DIAGNOSIS — N186 End stage renal disease: Secondary | ICD-10-CM | POA: Diagnosis not present

## 2021-07-30 DIAGNOSIS — N2581 Secondary hyperparathyroidism of renal origin: Secondary | ICD-10-CM | POA: Diagnosis not present

## 2021-08-03 DIAGNOSIS — Z992 Dependence on renal dialysis: Secondary | ICD-10-CM | POA: Diagnosis not present

## 2021-08-03 DIAGNOSIS — N2581 Secondary hyperparathyroidism of renal origin: Secondary | ICD-10-CM | POA: Diagnosis not present

## 2021-08-03 DIAGNOSIS — N186 End stage renal disease: Secondary | ICD-10-CM | POA: Diagnosis not present

## 2021-08-03 DIAGNOSIS — D631 Anemia in chronic kidney disease: Secondary | ICD-10-CM | POA: Diagnosis not present

## 2021-08-06 DIAGNOSIS — D631 Anemia in chronic kidney disease: Secondary | ICD-10-CM | POA: Diagnosis not present

## 2021-08-06 DIAGNOSIS — Z992 Dependence on renal dialysis: Secondary | ICD-10-CM | POA: Diagnosis not present

## 2021-08-06 DIAGNOSIS — N186 End stage renal disease: Secondary | ICD-10-CM | POA: Diagnosis not present

## 2021-08-06 DIAGNOSIS — N2581 Secondary hyperparathyroidism of renal origin: Secondary | ICD-10-CM | POA: Diagnosis not present

## 2021-08-10 DIAGNOSIS — N186 End stage renal disease: Secondary | ICD-10-CM | POA: Diagnosis not present

## 2021-08-10 DIAGNOSIS — N2581 Secondary hyperparathyroidism of renal origin: Secondary | ICD-10-CM | POA: Diagnosis not present

## 2021-08-10 DIAGNOSIS — D631 Anemia in chronic kidney disease: Secondary | ICD-10-CM | POA: Diagnosis not present

## 2021-08-10 DIAGNOSIS — Z992 Dependence on renal dialysis: Secondary | ICD-10-CM | POA: Diagnosis not present

## 2021-08-13 DIAGNOSIS — D631 Anemia in chronic kidney disease: Secondary | ICD-10-CM | POA: Diagnosis not present

## 2021-08-13 DIAGNOSIS — N2581 Secondary hyperparathyroidism of renal origin: Secondary | ICD-10-CM | POA: Diagnosis not present

## 2021-08-13 DIAGNOSIS — N186 End stage renal disease: Secondary | ICD-10-CM | POA: Diagnosis not present

## 2021-08-13 DIAGNOSIS — Z992 Dependence on renal dialysis: Secondary | ICD-10-CM | POA: Diagnosis not present

## 2021-08-13 NOTE — Progress Notes (Signed)
Histology and Location of Primary Skin Cancer: Basal cell carcinoma of the nose  Biopsies revealed 06/19/2021   Raymond Hurley. presented with the following signs/symptoms: patient was recently diagnosed with a SCC posterior scalp, which was excised. Also with a BCC L nose, near the site of a prior BCC. Mohs considered to be too deforming  Past/Anticipated interventions by patient's surgeon/dermatologist for current problematic lesion, if any:  07/26/2021 Dr. Griselda Miner (office visit)   History of Blistering sunburns, if any: Patient denies, but does have a long history of sun exposure  SAFETY ISSUES: Prior radiation? No Pacemaker/ICD? No Possible current pregnancy? N/A Is the patient on methotrexate? No  Current Complaints / other details:  History of metastatic melanoma. Currently does dialysis 2x week (Mondays and Fridays)

## 2021-08-13 NOTE — Progress Notes (Signed)
Radiation Oncology         [phone]) [phone] ________________________________  Initial Outpatient Consultation  Name: Raymond Hurley. MRN: [id number]  Date: 08/14/2021  DOB: 18-Mar-1931  JQ:[id number], Christiane Ha, MD  Griselda Miner, MD   REFERRING PHYSICIAN: Griselda Miner, MD  DIAGNOSIS:    ICD-10-CM   1. Basal cell carcinoma (BCC) of left side of nose  C44.311     2. Basal cell carcinoma of left side of nose  C44.311       Cancer Staging Basal cell carcinoma of left side of nose Staging form: Cutaneous Carcinoma of the Head and Neck, AJCC 8th Edition - Clinical stage from 08/14/2021: Stage II (cT2, cN0, cM0) - Signed by Eppie Gibson, MD on 08/14/2021 Stage prefix: Initial diagnosis Extraosseous extension: Absent  Basal cell carcinoma of the nose  CHIEF COMPLAINT: Here to discuss management of basal cell carcinoma of the nose.  HISTORY OF PRESENT ILLNESS::Raymond G Diallo Ponder. is a 87 y.o. male who presented with a large recurrent BCC of the nose.  Skin biopsies on 06/19/21 revealed:  --Left nose supratip shave: basal cell carcinoma with mixed pattern --Right inferior vertex scalp shave: well differentiated squamous cell carcinoma --Inferior central vertex scalp shave: squamous cell carcinoma in situ arising arising in a seborrheic keratosis  SCC of the posterior scalp was excised, however BCC of the left nose was considered too deforming to be performed. Subsequently, Dr. Sarajane Jews placed a referred to me for consideration of radiation to the site of nose BCC.   Below is a photo sent to me by Dr. Sarajane Jews estimating the borders of the tumor in ink    The patient reports that he had surgical treatment for a skin cancer of the contralateral side of his nose which led to narrowing of his nostril.  PREVIOUS RADIATION THERAPY: No  PAST MEDICAL HISTORY:  has a past medical history of Acute pancreatitis, Atrial fibrillation ([place]), CAD (coronary artery disease) (04/2019), CHF  (congestive heart failure) ([place]), Chronic kidney disease, Melanoma ([place]), and Vitamin B 12 deficiency.    PAST SURGICAL HISTORY: Past Surgical History:  Procedure Laterality Date   CARDIOVERSION N/A 06/02/2019   Procedure: CARDIOVERSION;  Surgeon: Jerline Pain, MD;  Location: [facility];  Service: Cardiovascular;  Laterality: N/A;   IR FLUORO GUIDE CV LINE RIGHT  11/06/2020   IR US GUIDE VASC ACCESS RIGHT  11/06/2020   RIGHT/LEFT HEART CATH AND CORONARY ANGIOGRAPHY N/A 04/27/2019   Procedure: RIGHT/LEFT HEART CATH AND CORONARY ANGIOGRAPHY;  Surgeon: Jolaine Artist, MD;  Location: [place] [facility];  Service: Cardiovascular;  Laterality: N/A;   TEE WITHOUT CARDIOVERSION N/A 06/02/2019   Procedure: TRANSESOPHAGEAL ECHOCARDIOGRAM (TEE);  Surgeon: Jerline Pain, MD;  Location[facility];  Service: Cardiovascular;  Laterality: N/A;    FAMILY HISTORY: family history includes Melanoma in his daughter.  SOCIAL HISTORY:  reports that he has never smoked. He has never used smokeless tobacco. He reports current alcohol use of about 7.0 standard drinks per week. He reports that he does not use drugs.  ALLERGIES: Tape and Bee venom  MEDICATIONS:  Current Outpatient Medications  Medication Sig Dispense Refill   amiodarone (PACERONE) 200 MG tablet Take one pill twice a day till 02/25. Starting on 02/26 decrease to one pill daily. 35 tablet 0   B Complex-C-Folic Acid (RENAL VITAMIN) 0.8 MG TABS Take 1 tablet by mouth at bedtime.     docusate sodium (COLACE) 100 MG capsule Take 100 mg by mouth 2 (two)  times daily.     Ferrous Sulfate (IRON SUPPLEMENT PO) Take 1 tablet by mouth daily.     lactobacillus acidophilus & bulgar (LACTINEX) chewable tablet Chew 1 tablet by mouth 3 (three) times daily with meals. 90 tablet 0   midodrine (PROAMATINE) 10 MG tablet Take 1 tablet (10 mg total) by mouth 3 (three) times daily. 90 tablet 0   polyethylene glycol powder (GLYCOLAX/MIRALAX) 17 GM/SCOOP powder Take  17 g by mouth daily as needed.     No current facility-administered medications for this encounter.    REVIEW OF SYSTEMS:  Notable for that above.   PHYSICAL EXAM:  height is 5\' 10"  (1.778 m) and weight is 177 lb 6 oz (80.5 kg). His temporal temperature is 97.3 F (36.3 C) (abnormal). His blood pressure is 127/69 and his pulse is 74. His respiration is 18 and oxygen saturation is 100%.   General: Alert and oriented, in no acute distress -he is here with his wife HEENT: Notable for evidence of sun damage throughout the skin of the head and neck.  Tissue deficit noted in left helix and surgical cosmetic changes noted in right nose.  See photograph above depicting lesion of right nose which is ulcerative and oozing clear discharge as well as blood Neck: Neck is supple, no palpable cervical or supraclavicular lymphadenopathy. Heart: Regular in rate and rhythm with no murmurs, rubs, or gallops. Chest clear to auscultation bilaterally Extremities: Trace lower extremity edema. Lymphatics: see Neck Exam Skin: Evidence of chronic sun exposure and damage to skin   ECOG = 1  0 - Asymptomatic (Fully active, able to carry on all predisease activities without restriction)  1 - Symptomatic but completely ambulatory (Restricted in physically strenuous activity but ambulatory and able to carry out work of a light or sedentary nature. For example, light housework, office work)  2 - Symptomatic, <50% in bed during the day (Ambulatory and capable of all self care but unable to carry out any work activities. Up and about more than 50% of waking hours)  3 - Symptomatic, >50% in bed, but not bedbound (Capable of only limited self-care, confined to bed or chair 50% or more of waking hours)  4 - Bedbound (Completely disabled. Cannot carry on any self-care. Totally confined to bed or chair)  5 - Death   Eustace Pen MM, Creech RH, Tormey DC, et al. [phone]). "Toxicity and response criteria of the [facility]". [place] Oncol. 5 (6): 649-55   LABORATORY DATA:  Lab Results  Component Value Date   WBC 3.2 (L) 07/23/2021   HGB 7.0 (L) 07/23/2021   HCT 22.0 (L) 07/23/2021   MCV 112.8 (H) 07/23/2021   PLT 256 07/23/2021   CMP     Component Value Date/Time   NA 133 (L) 07/23/2021 1700   NA 144 03/03/2020 1246   K 3.7 07/23/2021 1700   CL 96 (L) 07/23/2021 1700   CO2 29 07/23/2021 1700   GLUCOSE 115 (H) 07/23/2021 1700   BUN 10 07/23/2021 1700   BUN 14 03/03/2020 1246   CREATININE 2.31 (H) 07/23/2021 1700   CALCIUM 9.3 07/23/2021 1700   PROT 6.5 07/23/2021 1700   ALBUMIN 3.5 07/23/2021 1700   AST 23 07/23/2021 1700   ALT 18 07/23/2021 1700   ALKPHOS 65 07/23/2021 1700   BILITOT 0.8 07/23/2021 1700   GFRNONAA 27 (L) 07/23/2021 1700   GFRAA 64 03/03/2020 1246        RADIOGRAPHY: No results found.  IMPRESSION/PLAN:Today, I talked to the patient about the findings and work-up thus far.  We discussed the patient's diagnosis of basal cell carcinoma of the nose and general treatment for this, highlighting the role of radiotherapy in the management.  We discussed the available radiation techniques, and focused on the details of logistics and delivery.   He is not interested in surgery and is enthusiastic about less invasive options  We discussed various fractionation schemes.  I think the most appropriate fractionation scheme for him is to receive treatment 5 days a week for 4 weeks.  He and his wife are enthusiastic about this approach  We discussed logistics of treatment and scheduling and how we will work around his dialysis appointments  We discussed the risks, benefits, and side effects of radiotherapy. Side effects may include but not necessarily be limited to: Fatigue, skin irritation, bleeding, irritation of the mucosa of the nostril, local hair loss, possible injury to the cartilage of the nose which could be serious and require subsequent surgery;  no guarantees  of treatment were given. A consent form was signed and placed in the patient's medical record. The patient was encouraged to ask questions that I answered to the best of my ability.    We will schedule treatment planning in the near future.    On date of service, in total, I spent 50 minutes on this encounter. Patient was seen in person.   __________________________________________   Eppie Gibson, MD  This document serves as a record of services personally performed by Eppie Gibson, MD. It was created on her behalf by Roney Mans, a trained medical scribe. The creation of this record is based on the scribe's personal observations and the provider's statements to them. This document has been checked and approved by the attending provider.

## 2021-08-14 ENCOUNTER — Other Ambulatory Visit: Payer: Self-pay

## 2021-08-14 ENCOUNTER — Ambulatory Visit
Admission: RE | Admit: 2021-08-14 | Discharge: 2021-08-14 | Disposition: A | Discharge status: Home / Self Care | Payer: Medicare Other | Source: Ambulatory Visit | Attending: Radiation Oncology | Admitting: Radiation Oncology

## 2021-08-14 ENCOUNTER — Encounter: Payer: Self-pay | Admitting: Radiation Oncology

## 2021-08-14 VITALS — BP 127/69 | HR 74 | Temp 97.3°F | Resp 18 | Ht 70.0 in | Wt 177.4 lb

## 2021-08-14 DIAGNOSIS — E538 Deficiency of other specified B group vitamins: Secondary | ICD-10-CM | POA: Insufficient documentation

## 2021-08-14 DIAGNOSIS — D631 Anemia in chronic kidney disease: Secondary | ICD-10-CM | POA: Diagnosis not present

## 2021-08-14 DIAGNOSIS — C44311 Basal cell carcinoma of skin of nose: Secondary | ICD-10-CM | POA: Insufficient documentation

## 2021-08-14 DIAGNOSIS — I129 Hypertensive chronic kidney disease with stage 1 through stage 4 chronic kidney disease, or unspecified chronic kidney disease: Secondary | ICD-10-CM | POA: Diagnosis not present

## 2021-08-14 DIAGNOSIS — Z85820 Personal history of malignant melanoma of skin: Secondary | ICD-10-CM | POA: Insufficient documentation

## 2021-08-14 DIAGNOSIS — I509 Heart failure, unspecified: Secondary | ICD-10-CM | POA: Insufficient documentation

## 2021-08-14 DIAGNOSIS — N186 End stage renal disease: Secondary | ICD-10-CM | POA: Insufficient documentation

## 2021-08-14 DIAGNOSIS — N2581 Secondary hyperparathyroidism of renal origin: Secondary | ICD-10-CM | POA: Diagnosis not present

## 2021-08-14 DIAGNOSIS — I2510 Atherosclerotic heart disease of native coronary artery without angina pectoris: Secondary | ICD-10-CM | POA: Diagnosis not present

## 2021-08-14 DIAGNOSIS — T8249XD Other complication of vascular dialysis catheter, subsequent encounter: Secondary | ICD-10-CM | POA: Diagnosis not present

## 2021-08-14 DIAGNOSIS — I4891 Unspecified atrial fibrillation: Secondary | ICD-10-CM | POA: Insufficient documentation

## 2021-08-14 DIAGNOSIS — Z992 Dependence on renal dialysis: Secondary | ICD-10-CM | POA: Diagnosis not present

## 2021-08-14 NOTE — Progress Notes (Signed)
Oncology Nurse Navigator Documentation   Met with patient during initial consult with Dr. Isidore Moos.  He was accompanied by his wife, Webb Silversmith.  I introduced myself as his/their Navigator, explained my role as a member of the Care Team. Assisted with post-consult appt scheduling. He is aware that he will receive a call from a Radiation Therapist to schedule his radiation planning session.  They verbalized understanding of information provided. I encouraged them to call with questions/concerns moving forward.  Harlow Asa, RN, BSN, OCN Head & Neck Oncology Nurse [place] [facility] [place] [phone]

## 2021-08-15 DIAGNOSIS — T8249XD Other complication of vascular dialysis catheter, subsequent encounter: Secondary | ICD-10-CM | POA: Diagnosis not present

## 2021-08-15 DIAGNOSIS — N186 End stage renal disease: Secondary | ICD-10-CM | POA: Diagnosis not present

## 2021-08-15 DIAGNOSIS — N2581 Secondary hyperparathyroidism of renal origin: Secondary | ICD-10-CM | POA: Diagnosis not present

## 2021-08-15 DIAGNOSIS — D631 Anemia in chronic kidney disease: Secondary | ICD-10-CM | POA: Diagnosis not present

## 2021-08-15 DIAGNOSIS — Z992 Dependence on renal dialysis: Secondary | ICD-10-CM | POA: Diagnosis not present

## 2021-08-17 DIAGNOSIS — N186 End stage renal disease: Secondary | ICD-10-CM | POA: Diagnosis not present

## 2021-08-17 DIAGNOSIS — Z992 Dependence on renal dialysis: Secondary | ICD-10-CM | POA: Diagnosis not present

## 2021-08-17 DIAGNOSIS — N2581 Secondary hyperparathyroidism of renal origin: Secondary | ICD-10-CM | POA: Diagnosis not present

## 2021-08-17 DIAGNOSIS — T8249XD Other complication of vascular dialysis catheter, subsequent encounter: Secondary | ICD-10-CM | POA: Diagnosis not present

## 2021-08-17 DIAGNOSIS — D631 Anemia in chronic kidney disease: Secondary | ICD-10-CM | POA: Diagnosis not present

## 2021-08-20 DIAGNOSIS — D631 Anemia in chronic kidney disease: Secondary | ICD-10-CM | POA: Diagnosis not present

## 2021-08-20 DIAGNOSIS — T8249XD Other complication of vascular dialysis catheter, subsequent encounter: Secondary | ICD-10-CM | POA: Diagnosis not present

## 2021-08-20 DIAGNOSIS — Z992 Dependence on renal dialysis: Secondary | ICD-10-CM | POA: Diagnosis not present

## 2021-08-20 DIAGNOSIS — N2581 Secondary hyperparathyroidism of renal origin: Secondary | ICD-10-CM | POA: Diagnosis not present

## 2021-08-20 DIAGNOSIS — N186 End stage renal disease: Secondary | ICD-10-CM | POA: Diagnosis not present

## 2021-08-21 DIAGNOSIS — C44622 Squamous cell carcinoma of skin of right upper limb, including shoulder: Secondary | ICD-10-CM | POA: Diagnosis not present

## 2021-08-21 DIAGNOSIS — C44519 Basal cell carcinoma of skin of other part of trunk: Secondary | ICD-10-CM | POA: Diagnosis not present

## 2021-08-21 DIAGNOSIS — Z85828 Personal history of other malignant neoplasm of skin: Secondary | ICD-10-CM | POA: Diagnosis not present

## 2021-08-21 DIAGNOSIS — D485 Neoplasm of uncertain behavior of skin: Secondary | ICD-10-CM | POA: Diagnosis not present

## 2021-08-21 DIAGNOSIS — Z85820 Personal history of malignant melanoma of skin: Secondary | ICD-10-CM | POA: Diagnosis not present

## 2021-08-21 DIAGNOSIS — D0439 Carcinoma in situ of skin of other parts of face: Secondary | ICD-10-CM | POA: Diagnosis not present

## 2021-08-21 DIAGNOSIS — L570 Actinic keratosis: Secondary | ICD-10-CM | POA: Diagnosis not present

## 2021-08-22 ENCOUNTER — Ambulatory Visit
Admission: RE | Admit: 2021-08-22 | Discharge: 2021-08-22 | Disposition: A | Discharge status: Home / Self Care | Payer: Medicare Other | Source: Ambulatory Visit | Attending: Radiation Oncology | Admitting: Radiation Oncology

## 2021-08-22 ENCOUNTER — Other Ambulatory Visit: Payer: Self-pay

## 2021-08-22 ENCOUNTER — Ambulatory Visit: Admission: RE | Admit: 2021-08-22 | Payer: Medicare Other | Source: Ambulatory Visit | Admitting: Radiation Oncology

## 2021-08-22 DIAGNOSIS — Z510 Encounter for antineoplastic radiation therapy: Secondary | ICD-10-CM | POA: Diagnosis not present

## 2021-08-22 DIAGNOSIS — C44311 Basal cell carcinoma of skin of nose: Secondary | ICD-10-CM | POA: Diagnosis not present

## 2021-08-23 ENCOUNTER — Ambulatory Visit: Payer: Medicare Other | Admitting: Radiation Oncology

## 2021-08-24 ENCOUNTER — Ambulatory Visit: Payer: Medicare Other | Admitting: Radiation Oncology

## 2021-08-24 DIAGNOSIS — N2581 Secondary hyperparathyroidism of renal origin: Secondary | ICD-10-CM | POA: Diagnosis not present

## 2021-08-24 DIAGNOSIS — T8249XD Other complication of vascular dialysis catheter, subsequent encounter: Secondary | ICD-10-CM | POA: Diagnosis not present

## 2021-08-24 DIAGNOSIS — D631 Anemia in chronic kidney disease: Secondary | ICD-10-CM | POA: Diagnosis not present

## 2021-08-24 DIAGNOSIS — N186 End stage renal disease: Secondary | ICD-10-CM | POA: Diagnosis not present

## 2021-08-24 DIAGNOSIS — Z992 Dependence on renal dialysis: Secondary | ICD-10-CM | POA: Diagnosis not present

## 2021-08-27 ENCOUNTER — Ambulatory Visit: Payer: Medicare Other | Admitting: Radiation Oncology

## 2021-08-27 ENCOUNTER — Other Ambulatory Visit: Payer: Self-pay

## 2021-08-27 ENCOUNTER — Ambulatory Visit
Admission: RE | Admit: 2021-08-27 | Discharge: 2021-08-27 | Disposition: A | Discharge status: Home / Self Care | Payer: Medicare Other | Source: Ambulatory Visit | Attending: Radiation Oncology | Admitting: Radiation Oncology

## 2021-08-27 DIAGNOSIS — C44311 Basal cell carcinoma of skin of nose: Secondary | ICD-10-CM | POA: Diagnosis not present

## 2021-08-27 DIAGNOSIS — Z510 Encounter for antineoplastic radiation therapy: Secondary | ICD-10-CM | POA: Diagnosis not present

## 2021-08-27 DIAGNOSIS — N186 End stage renal disease: Secondary | ICD-10-CM | POA: Diagnosis not present

## 2021-08-27 DIAGNOSIS — D631 Anemia in chronic kidney disease: Secondary | ICD-10-CM | POA: Diagnosis not present

## 2021-08-27 DIAGNOSIS — Z992 Dependence on renal dialysis: Secondary | ICD-10-CM | POA: Diagnosis not present

## 2021-08-27 DIAGNOSIS — N2581 Secondary hyperparathyroidism of renal origin: Secondary | ICD-10-CM | POA: Diagnosis not present

## 2021-08-27 DIAGNOSIS — T8249XD Other complication of vascular dialysis catheter, subsequent encounter: Secondary | ICD-10-CM | POA: Diagnosis not present

## 2021-08-27 MED ORDER — SONAFINE EX EMUL
1.0000 "application " | Freq: Two times a day (BID) | CUTANEOUS | Status: DC
  Administered 2021-08-27: 1 via TOPICAL

## 2021-08-27 NOTE — Progress Notes (Signed)
Pt here for patient teaching.    Pt given Radiation and You booklet, skin care instructions, and Sonafine.    Reviewed areas of pertinence such as fatigue, hair loss, mouth changes, and skin changes .   Pt able to give teach back of to pat skin, use unscented/gentle soap, and drink plenty of water,apply Sonafine bid and avoid applying anything to skin within 4 hours of treatment.   Pt demonstrated understanding and verbalizes understanding of information given and will contact nursing with any questions or concerns.    [email or web address]

## 2021-08-28 ENCOUNTER — Ambulatory Visit: Payer: Medicare Other | Admitting: Radiation Oncology

## 2021-08-28 ENCOUNTER — Ambulatory Visit
Admission: RE | Admit: 2021-08-28 | Discharge: 2021-08-28 | Disposition: A | Discharge status: Home / Self Care | Payer: Medicare Other | Source: Ambulatory Visit | Attending: Radiation Oncology | Admitting: Radiation Oncology

## 2021-08-28 DIAGNOSIS — Z510 Encounter for antineoplastic radiation therapy: Secondary | ICD-10-CM | POA: Diagnosis not present

## 2021-08-28 DIAGNOSIS — C44311 Basal cell carcinoma of skin of nose: Secondary | ICD-10-CM | POA: Diagnosis not present

## 2021-08-29 ENCOUNTER — Other Ambulatory Visit: Payer: Self-pay

## 2021-08-29 ENCOUNTER — Ambulatory Visit: Payer: Medicare Other | Admitting: Radiation Oncology

## 2021-08-29 ENCOUNTER — Ambulatory Visit
Admission: RE | Admit: 2021-08-29 | Discharge: 2021-08-29 | Disposition: A | Discharge status: Home / Self Care | Payer: Medicare Other | Source: Ambulatory Visit | Attending: Radiation Oncology | Admitting: Radiation Oncology

## 2021-08-29 DIAGNOSIS — Z510 Encounter for antineoplastic radiation therapy: Secondary | ICD-10-CM | POA: Diagnosis not present

## 2021-08-29 DIAGNOSIS — C44311 Basal cell carcinoma of skin of nose: Secondary | ICD-10-CM | POA: Diagnosis not present

## 2021-08-30 ENCOUNTER — Ambulatory Visit
Admission: RE | Admit: 2021-08-30 | Discharge: 2021-08-30 | Disposition: A | Discharge status: Home / Self Care | Payer: Medicare Other | Source: Ambulatory Visit | Attending: Radiation Oncology | Admitting: Radiation Oncology

## 2021-08-30 ENCOUNTER — Ambulatory Visit: Payer: Medicare Other | Admitting: Radiation Oncology

## 2021-08-30 DIAGNOSIS — Z510 Encounter for antineoplastic radiation therapy: Secondary | ICD-10-CM | POA: Diagnosis not present

## 2021-08-30 DIAGNOSIS — C44311 Basal cell carcinoma of skin of nose: Secondary | ICD-10-CM | POA: Diagnosis not present

## 2021-08-31 ENCOUNTER — Ambulatory Visit
Admission: RE | Admit: 2021-08-31 | Discharge: 2021-08-31 | Disposition: A | Discharge status: Home / Self Care | Payer: Medicare Other | Source: Ambulatory Visit | Attending: Radiation Oncology | Admitting: Radiation Oncology

## 2021-08-31 ENCOUNTER — Ambulatory Visit: Payer: Medicare Other | Admitting: Radiation Oncology

## 2021-08-31 DIAGNOSIS — C44311 Basal cell carcinoma of skin of nose: Secondary | ICD-10-CM | POA: Diagnosis not present

## 2021-08-31 DIAGNOSIS — N186 End stage renal disease: Secondary | ICD-10-CM | POA: Diagnosis not present

## 2021-08-31 DIAGNOSIS — D631 Anemia in chronic kidney disease: Secondary | ICD-10-CM | POA: Diagnosis not present

## 2021-08-31 DIAGNOSIS — T8249XD Other complication of vascular dialysis catheter, subsequent encounter: Secondary | ICD-10-CM | POA: Diagnosis not present

## 2021-08-31 DIAGNOSIS — Z992 Dependence on renal dialysis: Secondary | ICD-10-CM | POA: Diagnosis not present

## 2021-08-31 DIAGNOSIS — N2581 Secondary hyperparathyroidism of renal origin: Secondary | ICD-10-CM | POA: Diagnosis not present

## 2021-08-31 DIAGNOSIS — Z510 Encounter for antineoplastic radiation therapy: Secondary | ICD-10-CM | POA: Diagnosis not present

## 2021-09-02 ENCOUNTER — Ambulatory Visit
Admission: RE | Admit: 2021-09-02 | Discharge: 2021-09-02 | Disposition: A | Discharge status: Home / Self Care | Payer: Medicare Other | Source: Ambulatory Visit | Attending: Radiation Oncology | Admitting: Radiation Oncology

## 2021-09-02 DIAGNOSIS — C44311 Basal cell carcinoma of skin of nose: Secondary | ICD-10-CM | POA: Diagnosis not present

## 2021-09-02 DIAGNOSIS — Z510 Encounter for antineoplastic radiation therapy: Secondary | ICD-10-CM | POA: Diagnosis not present

## 2021-09-03 ENCOUNTER — Ambulatory Visit: Payer: Medicare Other | Admitting: Radiation Oncology

## 2021-09-03 ENCOUNTER — Ambulatory Visit: Payer: Medicare Other

## 2021-09-03 ENCOUNTER — Ambulatory Visit: Admission: RE | Admit: 2021-09-03 | Payer: Medicare Other | Source: Ambulatory Visit

## 2021-09-03 DIAGNOSIS — T8249XD Other complication of vascular dialysis catheter, subsequent encounter: Secondary | ICD-10-CM | POA: Diagnosis not present

## 2021-09-03 DIAGNOSIS — D631 Anemia in chronic kidney disease: Secondary | ICD-10-CM | POA: Diagnosis not present

## 2021-09-03 DIAGNOSIS — Z992 Dependence on renal dialysis: Secondary | ICD-10-CM | POA: Diagnosis not present

## 2021-09-03 DIAGNOSIS — N2581 Secondary hyperparathyroidism of renal origin: Secondary | ICD-10-CM | POA: Diagnosis not present

## 2021-09-03 DIAGNOSIS — N186 End stage renal disease: Secondary | ICD-10-CM | POA: Diagnosis not present

## 2021-09-04 ENCOUNTER — Ambulatory Visit: Payer: Medicare Other

## 2021-09-04 ENCOUNTER — Ambulatory Visit: Payer: Medicare Other | Admitting: Radiation Oncology

## 2021-09-05 ENCOUNTER — Ambulatory Visit: Payer: Medicare Other

## 2021-09-06 DIAGNOSIS — Z992 Dependence on renal dialysis: Secondary | ICD-10-CM | POA: Diagnosis not present

## 2021-09-06 DIAGNOSIS — N186 End stage renal disease: Secondary | ICD-10-CM | POA: Diagnosis not present

## 2021-09-07 DIAGNOSIS — N186 End stage renal disease: Secondary | ICD-10-CM | POA: Diagnosis not present

## 2021-09-07 DIAGNOSIS — N2581 Secondary hyperparathyroidism of renal origin: Secondary | ICD-10-CM | POA: Diagnosis not present

## 2021-09-07 DIAGNOSIS — D631 Anemia in chronic kidney disease: Secondary | ICD-10-CM | POA: Diagnosis not present

## 2021-09-07 DIAGNOSIS — Z992 Dependence on renal dialysis: Secondary | ICD-10-CM | POA: Diagnosis not present

## 2021-09-07 DIAGNOSIS — T8249XD Other complication of vascular dialysis catheter, subsequent encounter: Secondary | ICD-10-CM | POA: Diagnosis not present

## 2021-09-10 ENCOUNTER — Other Ambulatory Visit: Payer: Self-pay

## 2021-09-10 ENCOUNTER — Ambulatory Visit
Admission: RE | Admit: 2021-09-10 | Discharge: 2021-09-10 | Disposition: A | Discharge status: Home / Self Care | Payer: Medicare Other | Source: Ambulatory Visit | Attending: Radiation Oncology | Admitting: Radiation Oncology

## 2021-09-10 ENCOUNTER — Telehealth: Payer: Self-pay

## 2021-09-10 DIAGNOSIS — T8249XD Other complication of vascular dialysis catheter, subsequent encounter: Secondary | ICD-10-CM | POA: Diagnosis not present

## 2021-09-10 DIAGNOSIS — Z992 Dependence on renal dialysis: Secondary | ICD-10-CM | POA: Diagnosis not present

## 2021-09-10 DIAGNOSIS — C44311 Basal cell carcinoma of skin of nose: Secondary | ICD-10-CM | POA: Diagnosis not present

## 2021-09-10 DIAGNOSIS — D631 Anemia in chronic kidney disease: Secondary | ICD-10-CM | POA: Diagnosis not present

## 2021-09-10 DIAGNOSIS — Z510 Encounter for antineoplastic radiation therapy: Secondary | ICD-10-CM | POA: Diagnosis not present

## 2021-09-10 DIAGNOSIS — N2581 Secondary hyperparathyroidism of renal origin: Secondary | ICD-10-CM | POA: Diagnosis not present

## 2021-09-10 DIAGNOSIS — N186 End stage renal disease: Secondary | ICD-10-CM | POA: Diagnosis not present

## 2021-09-10 NOTE — Telephone Encounter (Signed)
Contacted patient to follow up him on his decision to proceed with left arm AVF vs AVG as previous surgery was cancelled and was not ready to schedule in September.  Spoke with patient's wife Raymond Hurley, who stated patient is down to two dialysis treatments per week and hoping to decrease it once, so he is not interested in moving his dialysis access from the [facility] at this time.  Patient will contact office if he changes his mind. Advised patient will need office visit prior to scheduling any procedure. Wife voiced understanding.

## 2021-09-11 ENCOUNTER — Ambulatory Visit
Admission: RE | Admit: 2021-09-11 | Discharge: 2021-09-11 | Disposition: A | Discharge status: Home / Self Care | Payer: Medicare Other | Source: Ambulatory Visit | Attending: Radiation Oncology | Admitting: Radiation Oncology

## 2021-09-11 DIAGNOSIS — C44311 Basal cell carcinoma of skin of nose: Secondary | ICD-10-CM | POA: Diagnosis not present

## 2021-09-11 DIAGNOSIS — Z510 Encounter for antineoplastic radiation therapy: Secondary | ICD-10-CM | POA: Diagnosis not present

## 2021-09-12 ENCOUNTER — Ambulatory Visit
Admission: RE | Admit: 2021-09-12 | Discharge: 2021-09-12 | Disposition: A | Discharge status: Home / Self Care | Payer: Medicare Other | Source: Ambulatory Visit | Attending: Radiation Oncology | Admitting: Radiation Oncology

## 2021-09-12 ENCOUNTER — Other Ambulatory Visit: Payer: Self-pay

## 2021-09-12 ENCOUNTER — Telehealth: Payer: Self-pay

## 2021-09-12 DIAGNOSIS — C44311 Basal cell carcinoma of skin of nose: Secondary | ICD-10-CM | POA: Diagnosis not present

## 2021-09-12 DIAGNOSIS — Z510 Encounter for antineoplastic radiation therapy: Secondary | ICD-10-CM | POA: Diagnosis not present

## 2021-09-12 NOTE — Telephone Encounter (Signed)
After radiation treatment today patient brought up concern of increased tearing to his left eye. Informed him I would pass his concern along to Dr. Isidore Moos and call him with an update.   Per Dr. Isidore Moos: "Please let him know that we are treating at the edge of his nasolacrimal duct (you can say tear duct between his nose and eye) and that his eye is not in the field.  I suspect the tear duct is going into overdrive and will settle down once he heals from RT. This is nothing to worry about, but artificial tears (lubricated drops, not merely saline) may help with the watering if he uses them throughout the day."   Called patient's home number and spoke with patient's wife. Relayed Dr. Pearlie Oyster comments above. Wife verbalized understanding and stated she would pass information along to patient. Wife verbalized appreciation of call, and denied any other needs identified at this time.

## 2021-09-13 ENCOUNTER — Ambulatory Visit
Admission: RE | Admit: 2021-09-13 | Discharge: 2021-09-13 | Disposition: A | Discharge status: Home / Self Care | Payer: Medicare Other | Source: Ambulatory Visit | Attending: Radiation Oncology | Admitting: Radiation Oncology

## 2021-09-13 DIAGNOSIS — I129 Hypertensive chronic kidney disease with stage 1 through stage 4 chronic kidney disease, or unspecified chronic kidney disease: Secondary | ICD-10-CM | POA: Diagnosis not present

## 2021-09-13 DIAGNOSIS — C44311 Basal cell carcinoma of skin of nose: Secondary | ICD-10-CM | POA: Diagnosis not present

## 2021-09-13 DIAGNOSIS — Z992 Dependence on renal dialysis: Secondary | ICD-10-CM | POA: Diagnosis not present

## 2021-09-13 DIAGNOSIS — D509 Iron deficiency anemia, unspecified: Secondary | ICD-10-CM | POA: Diagnosis not present

## 2021-09-13 DIAGNOSIS — N186 End stage renal disease: Secondary | ICD-10-CM | POA: Diagnosis not present

## 2021-09-13 DIAGNOSIS — N2581 Secondary hyperparathyroidism of renal origin: Secondary | ICD-10-CM | POA: Diagnosis not present

## 2021-09-13 DIAGNOSIS — D631 Anemia in chronic kidney disease: Secondary | ICD-10-CM | POA: Diagnosis not present

## 2021-09-14 ENCOUNTER — Ambulatory Visit
Admission: RE | Admit: 2021-09-14 | Discharge: 2021-09-14 | Disposition: A | Discharge status: Home / Self Care | Payer: Medicare Other | Source: Ambulatory Visit | Attending: Radiation Oncology | Admitting: Radiation Oncology

## 2021-09-14 DIAGNOSIS — N186 End stage renal disease: Secondary | ICD-10-CM | POA: Diagnosis not present

## 2021-09-14 DIAGNOSIS — C44311 Basal cell carcinoma of skin of nose: Secondary | ICD-10-CM | POA: Diagnosis not present

## 2021-09-14 DIAGNOSIS — Z992 Dependence on renal dialysis: Secondary | ICD-10-CM | POA: Diagnosis not present

## 2021-09-14 DIAGNOSIS — D509 Iron deficiency anemia, unspecified: Secondary | ICD-10-CM | POA: Diagnosis not present

## 2021-09-14 DIAGNOSIS — N2581 Secondary hyperparathyroidism of renal origin: Secondary | ICD-10-CM | POA: Diagnosis not present

## 2021-09-14 DIAGNOSIS — D631 Anemia in chronic kidney disease: Secondary | ICD-10-CM | POA: Diagnosis not present

## 2021-09-17 ENCOUNTER — Other Ambulatory Visit: Payer: Self-pay

## 2021-09-17 ENCOUNTER — Ambulatory Visit
Admission: RE | Admit: 2021-09-17 | Discharge: 2021-09-17 | Disposition: A | Discharge status: Home / Self Care | Payer: Medicare Other | Source: Ambulatory Visit | Attending: Radiation Oncology | Admitting: Radiation Oncology

## 2021-09-17 DIAGNOSIS — N186 End stage renal disease: Secondary | ICD-10-CM | POA: Diagnosis not present

## 2021-09-17 DIAGNOSIS — D509 Iron deficiency anemia, unspecified: Secondary | ICD-10-CM | POA: Diagnosis not present

## 2021-09-17 DIAGNOSIS — Z992 Dependence on renal dialysis: Secondary | ICD-10-CM | POA: Diagnosis not present

## 2021-09-17 DIAGNOSIS — N2581 Secondary hyperparathyroidism of renal origin: Secondary | ICD-10-CM | POA: Diagnosis not present

## 2021-09-17 DIAGNOSIS — D631 Anemia in chronic kidney disease: Secondary | ICD-10-CM | POA: Diagnosis not present

## 2021-09-17 DIAGNOSIS — C44311 Basal cell carcinoma of skin of nose: Secondary | ICD-10-CM | POA: Diagnosis not present

## 2021-09-18 ENCOUNTER — Ambulatory Visit
Admission: RE | Admit: 2021-09-18 | Discharge: 2021-09-18 | Disposition: A | Discharge status: Home / Self Care | Payer: Medicare Other | Source: Ambulatory Visit | Attending: Radiation Oncology | Admitting: Radiation Oncology

## 2021-09-18 DIAGNOSIS — C44311 Basal cell carcinoma of skin of nose: Secondary | ICD-10-CM | POA: Diagnosis not present

## 2021-09-19 ENCOUNTER — Ambulatory Visit
Admission: RE | Admit: 2021-09-19 | Discharge: 2021-09-19 | Disposition: A | Discharge status: Home / Self Care | Payer: Medicare Other | Source: Ambulatory Visit | Attending: Radiation Oncology | Admitting: Radiation Oncology

## 2021-09-19 ENCOUNTER — Other Ambulatory Visit: Payer: Self-pay

## 2021-09-19 DIAGNOSIS — C44311 Basal cell carcinoma of skin of nose: Secondary | ICD-10-CM | POA: Diagnosis not present

## 2021-09-20 ENCOUNTER — Ambulatory Visit
Admission: RE | Admit: 2021-09-20 | Discharge: 2021-09-20 | Disposition: A | Discharge status: Home / Self Care | Payer: Medicare Other | Source: Ambulatory Visit | Attending: Radiation Oncology | Admitting: Radiation Oncology

## 2021-09-20 DIAGNOSIS — C44311 Basal cell carcinoma of skin of nose: Secondary | ICD-10-CM | POA: Diagnosis not present

## 2021-09-21 ENCOUNTER — Other Ambulatory Visit: Payer: Self-pay

## 2021-09-21 ENCOUNTER — Ambulatory Visit
Admission: RE | Admit: 2021-09-21 | Discharge: 2021-09-21 | Disposition: A | Discharge status: Home / Self Care | Payer: Medicare Other | Source: Ambulatory Visit | Attending: Radiation Oncology | Admitting: Radiation Oncology

## 2021-09-21 DIAGNOSIS — D631 Anemia in chronic kidney disease: Secondary | ICD-10-CM | POA: Diagnosis not present

## 2021-09-21 DIAGNOSIS — N2581 Secondary hyperparathyroidism of renal origin: Secondary | ICD-10-CM | POA: Diagnosis not present

## 2021-09-21 DIAGNOSIS — Z992 Dependence on renal dialysis: Secondary | ICD-10-CM | POA: Diagnosis not present

## 2021-09-21 DIAGNOSIS — N186 End stage renal disease: Secondary | ICD-10-CM | POA: Diagnosis not present

## 2021-09-21 DIAGNOSIS — D509 Iron deficiency anemia, unspecified: Secondary | ICD-10-CM | POA: Diagnosis not present

## 2021-09-21 DIAGNOSIS — C44311 Basal cell carcinoma of skin of nose: Secondary | ICD-10-CM | POA: Diagnosis not present

## 2021-09-21 NOTE — Progress Notes (Signed)
HPI:FU atrial flutter and CM. Previously seen and noted to have tachycardia.  He was therefore referred for an echocardiogram.  Echo June 2020 showed ejection fraction 25 to 30%, mild biatrial enlargement, mild mitral and tricuspid regurgitation. Cardiac catheterization July 2020 showed a 20% circumflex lesion, right atrial pressure of 14 and pulmonary capillary wedge pressure 8.  Medical therapy recommended.  Pt then seen in [place] and found to be in atrial flutter. Echocardiogram showed severe LV dysfunction.  He also apparently had ventricular tachycardia for 10 minutes though I do not have those results available and apparently he was asymptomatic. Cardioversion was recommended but patient left AGAINST MEDICAL ADVICE.  When I first evaluated the patient on 8/20 he noted increased dyspnea on exertion since April.  We arranged TEE guided cardioversion which was performed June 02, 2019.  TEE showed ejection fraction 20 to 25%, mild left atrial and right atrial enlargement and cardioversion was successful. Patient developed COVID January 2022 associated with pancreatitis and acute renal failure requiring dialysis.  He had recurrent atrial fibrillation and was placed on amiodarone.  Carotid Dopplers January 2022 near normal bilaterally.  Echocardiogram January 2022 showed normal LV function, mild biatrial enlargement.  Patient apparently subsequently found to have a pulmonary embolus by CT scan April 2022.  CTA April 2022 showed pulmonary embolus in the right main pulmonary artery, clot adherent to the right central venous catheter, extensive peripancreatic collection favored to be sequela of pancreatitis, retroperitoneal fluid collection.  His anticoagulation had been discontinued previously due to GI bleed.  He declined resuming anticoagulation and also IVC filter.  Since last seen he denies dyspnea, chest pain, palpitations or syncope.  No pedal edema.  He is having decreased need for dialysis and  is going to have 1 session weekly in the near future.  He also states his blood pressure is increasing.  Current Outpatient Medications  Medication Sig Dispense Refill   amiodarone (PACERONE) 200 MG tablet Take one pill twice a day till 02/25. Starting on 02/26 decrease to one pill daily. 35 tablet 0   B Complex-C-Folic Acid (RENAL VITAMIN) 0.8 MG TABS Take 1 tablet by mouth at bedtime.     Ferrous Sulfate (IRON SUPPLEMENT PO) Take 1 tablet by mouth daily.     midodrine (PROAMATINE) 10 MG tablet Take 1 tablet (10 mg total) by mouth 3 (three) times daily. 90 tablet 0   pantoprazole (PROTONIX) 40 MG tablet Take 40 mg by mouth every morning.     polyethylene glycol powder (GLYCOLAX/MIRALAX) 17 GM/SCOOP powder Take 17 g by mouth daily as needed.     docusate sodium (COLACE) 100 MG capsule Take 100 mg by mouth 2 (two) times daily. (Patient not taking: Reported on 09/28/2021)     lactobacillus acidophilus & bulgar (LACTINEX) chewable tablet Chew 1 tablet by mouth 3 (three) times daily with meals. (Patient not taking: Reported on 09/28/2021) 90 tablet 0   No current facility-administered medications for this visit.     Past Medical History:  Diagnosis Date   Acute pancreatitis    Atrial fibrillation (HCC)    CAD (coronary artery disease) 04/2019   CHF (congestive heart failure) (HCC)    Chronic kidney disease    Melanoma ([place])    Vitamin B 12 deficiency     Past Surgical History:  Procedure Laterality Date   CARDIOVERSION N/A 06/02/2019   Procedure: CARDIOVERSION;  Surgeon: Jerline Pain, MD;  Location: [facility];  Service: Cardiovascular;  Laterality: N/A;  IR FLUORO GUIDE CV LINE RIGHT  11/06/2020   IR US GUIDE VASC ACCESS RIGHT  11/06/2020   RIGHT/LEFT HEART CATH AND CORONARY ANGIOGRAPHY N/A 04/27/2019   Procedure: RIGHT/LEFT HEART CATH AND CORONARY ANGIOGRAPHY;  Surgeon: Jolaine Artist, MD;  Location: [place] CV LAB;  Service: Cardiovascular;  Laterality: N/A;   TEE WITHOUT  CARDIOVERSION N/A 06/02/2019   Procedure: TRANSESOPHAGEAL ECHOCARDIOGRAM (TEE);  Surgeon: Jerline Pain, MD;  Location: [facility];  Service: Cardiovascular;  Laterality: N/A;    Social History   Socioeconomic History   Marital status: Married    Spouse name: Not on file   Number of children: Not on file   Years of education: Not on file   Highest education level: Not on file  Occupational History   Not on file  Tobacco Use   Smoking status: Never   Smokeless tobacco: Never  Vaping Use   Vaping Use: Never used  Substance and Sexual Activity   Alcohol use: Yes    Alcohol/week: 7.0 standard drinks    Types: 7 Glasses of wine per week   Drug use: Never   Sexual activity: Not Currently  Other Topics Concern   Not on file  Social History Narrative   Not on file   Social Determinants of Health   Financial Resource Strain: Not on file  Food Insecurity: Not on file  Transportation Needs: Not on file  Physical Activity: Not on file  Stress: Not on file  Social Connections: Not on file  Intimate Partner Violence: Not on file    Family History  Problem Relation Age of Onset   Melanoma Daughter     ROS: no fevers or chills, productive cough, hemoptysis, dysphasia, odynophagia, melena, hematochezia, dysuria, hematuria, rash, seizure activity, orthopnea, PND, pedal edema, claudication. Remaining systems are negative.  Physical Exam: Well-developed well-nourished in no acute distress.  Skin is warm and dry.  HEENT is normal.  Neck is supple.  Chest is clear to auscultation with normal expansion.  Cardiovascular exam is regular rate and rhythm.  Abdominal exam nontender or distended. No masses palpated. Extremities show no edema. neuro grossly intact  ECG-normal sinus rhythm at a rate of 67, first-degree AV block, right bundle branch block, left anterior fascicular block.  Personally reviewed  A/P  1 history of nonischemic cardiomyopathy-this was felt to be  tachycardia mediated.  Most recent echocardiogram showed normalization of LV function.  Entresto and beta-blockade have been discontinued as he has had difficulties with hypotension since dialysis was initiated.  2 paroxysmal atrial fibrillation/flutter-patient remains in sinus rhythm.  Continue amiodarone.  Check TSH and chest x-ray.  Recent LFTs normal.  Apixaban discontinued previously due to GI bleed.  He declined to resume.  He understands there is a higher risk of CVA.  3 chronic combined systolic/diastolic congestive heart failure-volume is now managed with dialysis.  4 end-stage renal disease-dialysis per nephrology.  Note he is on midodrine for low blood pressure but his systolic blood pressure at times is in the 150 range.  He is only taking midodrine twice daily.  I have instructed him to decrease further if his blood pressure continues to run high and he may end up taking this medication only on dialysis days.  5 history of pulmonary embolus-patient declined resuming anticoagulation previously.  Kirk Ruths, MD

## 2021-09-24 ENCOUNTER — Ambulatory Visit
Admission: RE | Admit: 2021-09-24 | Discharge: 2021-09-24 | Disposition: A | Discharge status: Home / Self Care | Payer: Medicare Other | Source: Ambulatory Visit | Attending: Radiation Oncology | Admitting: Radiation Oncology

## 2021-09-24 ENCOUNTER — Ambulatory Visit: Payer: Medicare Other

## 2021-09-24 ENCOUNTER — Other Ambulatory Visit: Payer: Self-pay

## 2021-09-24 DIAGNOSIS — Z992 Dependence on renal dialysis: Secondary | ICD-10-CM | POA: Diagnosis not present

## 2021-09-24 DIAGNOSIS — C44311 Basal cell carcinoma of skin of nose: Secondary | ICD-10-CM | POA: Diagnosis not present

## 2021-09-24 DIAGNOSIS — N186 End stage renal disease: Secondary | ICD-10-CM | POA: Diagnosis not present

## 2021-09-24 DIAGNOSIS — D631 Anemia in chronic kidney disease: Secondary | ICD-10-CM | POA: Diagnosis not present

## 2021-09-24 DIAGNOSIS — D509 Iron deficiency anemia, unspecified: Secondary | ICD-10-CM | POA: Diagnosis not present

## 2021-09-24 DIAGNOSIS — N2581 Secondary hyperparathyroidism of renal origin: Secondary | ICD-10-CM | POA: Diagnosis not present

## 2021-09-24 MED ORDER — SONAFINE EX EMUL
1.0000 "application " | Freq: Two times a day (BID) | CUTANEOUS | Status: DC
  Administered 2021-09-24: 1 via TOPICAL

## 2021-09-25 ENCOUNTER — Other Ambulatory Visit: Payer: Self-pay

## 2021-09-25 ENCOUNTER — Ambulatory Visit
Admission: RE | Admit: 2021-09-25 | Discharge: 2021-09-25 | Disposition: A | Discharge status: Home / Self Care | Payer: Medicare Other | Source: Ambulatory Visit | Attending: Radiation Oncology | Admitting: Radiation Oncology

## 2021-09-25 ENCOUNTER — Ambulatory Visit: Payer: Medicare Other

## 2021-09-25 DIAGNOSIS — C44311 Basal cell carcinoma of skin of nose: Secondary | ICD-10-CM | POA: Diagnosis not present

## 2021-09-26 ENCOUNTER — Ambulatory Visit
Admission: RE | Admit: 2021-09-26 | Discharge: 2021-09-26 | Disposition: A | Discharge status: Home / Self Care | Payer: Medicare Other | Source: Ambulatory Visit | Attending: Radiation Oncology | Admitting: Radiation Oncology

## 2021-09-26 DIAGNOSIS — N186 End stage renal disease: Secondary | ICD-10-CM | POA: Diagnosis not present

## 2021-09-26 DIAGNOSIS — D631 Anemia in chronic kidney disease: Secondary | ICD-10-CM | POA: Diagnosis not present

## 2021-09-26 DIAGNOSIS — I5022 Chronic systolic (congestive) heart failure: Secondary | ICD-10-CM | POA: Diagnosis not present

## 2021-09-26 DIAGNOSIS — C44311 Basal cell carcinoma of skin of nose: Secondary | ICD-10-CM | POA: Diagnosis not present

## 2021-09-27 ENCOUNTER — Ambulatory Visit: Payer: Medicare Other

## 2021-09-27 ENCOUNTER — Other Ambulatory Visit: Payer: Self-pay

## 2021-09-27 ENCOUNTER — Ambulatory Visit
Admission: RE | Admit: 2021-09-27 | Discharge: 2021-09-27 | Disposition: A | Discharge status: Home / Self Care | Payer: Medicare Other | Source: Ambulatory Visit | Attending: Radiation Oncology | Admitting: Radiation Oncology

## 2021-09-27 DIAGNOSIS — C44311 Basal cell carcinoma of skin of nose: Secondary | ICD-10-CM | POA: Diagnosis not present

## 2021-09-28 ENCOUNTER — Ambulatory Visit
Admission: RE | Admit: 2021-09-28 | Discharge: 2021-09-28 | Disposition: A | Discharge status: Home / Self Care | Payer: Medicare Other | Source: Ambulatory Visit | Attending: Radiation Oncology | Admitting: Radiation Oncology

## 2021-09-28 ENCOUNTER — Encounter: Payer: Self-pay | Admitting: Cardiology

## 2021-09-28 ENCOUNTER — Ambulatory Visit (INDEPENDENT_AMBULATORY_CARE_PROVIDER_SITE_OTHER): Payer: Medicare Other | Admitting: Cardiology

## 2021-09-28 ENCOUNTER — Encounter: Payer: Self-pay | Admitting: Radiation Oncology

## 2021-09-28 VITALS — BP 115/64 | HR 67 | Ht 70.0 in | Wt 181.2 lb

## 2021-09-28 DIAGNOSIS — I5022 Chronic systolic (congestive) heart failure: Secondary | ICD-10-CM

## 2021-09-28 DIAGNOSIS — N186 End stage renal disease: Secondary | ICD-10-CM | POA: Diagnosis not present

## 2021-09-28 DIAGNOSIS — D631 Anemia in chronic kidney disease: Secondary | ICD-10-CM | POA: Diagnosis not present

## 2021-09-28 DIAGNOSIS — I428 Other cardiomyopathies: Secondary | ICD-10-CM

## 2021-09-28 DIAGNOSIS — I480 Paroxysmal atrial fibrillation: Secondary | ICD-10-CM

## 2021-09-28 DIAGNOSIS — Z992 Dependence on renal dialysis: Secondary | ICD-10-CM

## 2021-09-28 DIAGNOSIS — D509 Iron deficiency anemia, unspecified: Secondary | ICD-10-CM | POA: Diagnosis not present

## 2021-09-28 DIAGNOSIS — N2581 Secondary hyperparathyroidism of renal origin: Secondary | ICD-10-CM | POA: Diagnosis not present

## 2021-09-28 DIAGNOSIS — C44311 Basal cell carcinoma of skin of nose: Secondary | ICD-10-CM | POA: Diagnosis not present

## 2021-09-28 LAB — TSH: TSH: 120 u[IU]/mL — ABNORMAL HIGH (ref 0.450–4.500)

## 2021-09-28 NOTE — Patient Instructions (Signed)
°  Lab Work:  Your physician recommends that you HAVE LAB WORK TODAY  If you have labs (blood work) drawn today and your tests are completely normal, you will receive your results only by: MyChart Message (if you have MyChart) OR A paper copy in the mail If you have any lab test that is abnormal or we need to change your treatment, we will call you to review the results.   Testing/Procedures:  A chest x-ray takes a picture of the organs and structures inside the chest, including the heart, lungs, and blood vessels. This test can show several things, including, whether the heart is enlarges; whether fluid is building up in the lungs; and whether pacemaker / defibrillator leads are still in place. [facility] [place]   Follow-Up: At [facility], you and your health needs are our priority.  As part of our continuing mission to provide you with exceptional heart care, we have created designated Provider Care Teams.  These Care Teams include your primary Cardiologist (physician) and Advanced Practice Providers (APPs -  Physician Assistants and Nurse Practitioners) who all work together to provide you with the care you need, when you need it.  We recommend signing up for the patient portal called "MyChart".  Sign up information is provided on this After Visit Summary.  MyChart is used to connect with patients for Virtual Visits (Telemedicine).  Patients are able to view lab/test results, encounter notes, upcoming appointments, etc.  Non-urgent messages can be sent to your provider as well.   To learn more about what you can do with MyChart, go to [email or web address].    Your next appointment:   6 month(s)  The format for your next appointment:   In Person  Provider:   Kirk Ruths, MD

## 2021-10-01 ENCOUNTER — Other Ambulatory Visit: Payer: Self-pay

## 2021-10-01 ENCOUNTER — Ambulatory Visit
Admission: RE | Admit: 2021-10-01 | Discharge: 2021-10-01 | Disposition: A | Discharge status: Home / Self Care | Payer: Medicare Other | Source: Ambulatory Visit | Attending: Cardiology | Admitting: Cardiology

## 2021-10-01 DIAGNOSIS — Z79899 Other long term (current) drug therapy: Secondary | ICD-10-CM | POA: Diagnosis not present

## 2021-10-01 DIAGNOSIS — M47814 Spondylosis without myelopathy or radiculopathy, thoracic region: Secondary | ICD-10-CM | POA: Diagnosis not present

## 2021-10-01 DIAGNOSIS — R5383 Other fatigue: Secondary | ICD-10-CM

## 2021-10-01 DIAGNOSIS — I480 Paroxysmal atrial fibrillation: Secondary | ICD-10-CM

## 2021-10-01 DIAGNOSIS — R918 Other nonspecific abnormal finding of lung field: Secondary | ICD-10-CM | POA: Diagnosis not present

## 2021-10-01 MED ORDER — LEVOTHYROXINE SODIUM 50 MCG PO TABS: 50.0000 ug | ORAL_TABLET | Freq: Every day | ORAL | 3 refills | Status: DC

## 2021-10-03 DIAGNOSIS — N186 End stage renal disease: Secondary | ICD-10-CM | POA: Diagnosis not present

## 2021-10-03 DIAGNOSIS — N2581 Secondary hyperparathyroidism of renal origin: Secondary | ICD-10-CM | POA: Diagnosis not present

## 2021-10-03 DIAGNOSIS — D631 Anemia in chronic kidney disease: Secondary | ICD-10-CM | POA: Diagnosis not present

## 2021-10-03 DIAGNOSIS — Z992 Dependence on renal dialysis: Secondary | ICD-10-CM | POA: Diagnosis not present

## 2021-10-03 DIAGNOSIS — D509 Iron deficiency anemia, unspecified: Secondary | ICD-10-CM | POA: Diagnosis not present

## 2021-10-04 DIAGNOSIS — C799 Secondary malignant neoplasm of unspecified site: Secondary | ICD-10-CM | POA: Diagnosis not present

## 2021-10-04 DIAGNOSIS — K5900 Constipation, unspecified: Secondary | ICD-10-CM | POA: Diagnosis not present

## 2021-10-04 DIAGNOSIS — K863 Pseudocyst of pancreas: Secondary | ICD-10-CM | POA: Diagnosis not present

## 2021-10-04 DIAGNOSIS — Z992 Dependence on renal dialysis: Secondary | ICD-10-CM | POA: Diagnosis not present

## 2021-10-10 DIAGNOSIS — N2581 Secondary hyperparathyroidism of renal origin: Secondary | ICD-10-CM | POA: Diagnosis not present

## 2021-10-10 DIAGNOSIS — D509 Iron deficiency anemia, unspecified: Secondary | ICD-10-CM | POA: Diagnosis not present

## 2021-10-10 DIAGNOSIS — Z992 Dependence on renal dialysis: Secondary | ICD-10-CM | POA: Diagnosis not present

## 2021-10-10 DIAGNOSIS — N186 End stage renal disease: Secondary | ICD-10-CM | POA: Diagnosis not present

## 2021-10-10 DIAGNOSIS — D631 Anemia in chronic kidney disease: Secondary | ICD-10-CM | POA: Diagnosis not present

## 2021-10-14 DIAGNOSIS — I129 Hypertensive chronic kidney disease with stage 1 through stage 4 chronic kidney disease, or unspecified chronic kidney disease: Secondary | ICD-10-CM | POA: Diagnosis not present

## 2021-10-14 DIAGNOSIS — N186 End stage renal disease: Secondary | ICD-10-CM | POA: Diagnosis not present

## 2021-10-14 DIAGNOSIS — Z992 Dependence on renal dialysis: Secondary | ICD-10-CM | POA: Diagnosis not present

## 2021-10-16 DIAGNOSIS — Z992 Dependence on renal dialysis: Secondary | ICD-10-CM | POA: Diagnosis not present

## 2021-10-16 DIAGNOSIS — N186 End stage renal disease: Secondary | ICD-10-CM | POA: Diagnosis not present

## 2021-10-23 DIAGNOSIS — D631 Anemia in chronic kidney disease: Secondary | ICD-10-CM | POA: Diagnosis not present

## 2021-10-23 DIAGNOSIS — N186 End stage renal disease: Secondary | ICD-10-CM | POA: Diagnosis not present

## 2021-10-23 DIAGNOSIS — I700 Atherosclerosis of aorta: Secondary | ICD-10-CM | POA: Diagnosis not present

## 2021-10-23 DIAGNOSIS — I483 Typical atrial flutter: Secondary | ICD-10-CM | POA: Diagnosis not present

## 2021-10-23 DIAGNOSIS — J301 Allergic rhinitis due to pollen: Secondary | ICD-10-CM | POA: Diagnosis not present

## 2021-10-24 DIAGNOSIS — N2581 Secondary hyperparathyroidism of renal origin: Secondary | ICD-10-CM | POA: Diagnosis not present

## 2021-10-24 DIAGNOSIS — N186 End stage renal disease: Secondary | ICD-10-CM | POA: Diagnosis not present

## 2021-10-24 DIAGNOSIS — D509 Iron deficiency anemia, unspecified: Secondary | ICD-10-CM | POA: Diagnosis not present

## 2021-10-24 DIAGNOSIS — Z992 Dependence on renal dialysis: Secondary | ICD-10-CM | POA: Diagnosis not present

## 2021-10-24 DIAGNOSIS — D631 Anemia in chronic kidney disease: Secondary | ICD-10-CM | POA: Diagnosis not present

## 2021-10-25 DIAGNOSIS — N186 End stage renal disease: Secondary | ICD-10-CM | POA: Diagnosis not present

## 2021-10-25 DIAGNOSIS — I5022 Chronic systolic (congestive) heart failure: Secondary | ICD-10-CM | POA: Diagnosis not present

## 2021-10-25 DIAGNOSIS — D631 Anemia in chronic kidney disease: Secondary | ICD-10-CM | POA: Diagnosis not present

## 2021-10-31 DIAGNOSIS — N2581 Secondary hyperparathyroidism of renal origin: Secondary | ICD-10-CM | POA: Diagnosis not present

## 2021-10-31 DIAGNOSIS — D631 Anemia in chronic kidney disease: Secondary | ICD-10-CM | POA: Diagnosis not present

## 2021-10-31 DIAGNOSIS — D509 Iron deficiency anemia, unspecified: Secondary | ICD-10-CM | POA: Diagnosis not present

## 2021-10-31 DIAGNOSIS — N186 End stage renal disease: Secondary | ICD-10-CM | POA: Diagnosis not present

## 2021-10-31 DIAGNOSIS — Z992 Dependence on renal dialysis: Secondary | ICD-10-CM | POA: Diagnosis not present

## 2021-11-01 DIAGNOSIS — L821 Other seborrheic keratosis: Secondary | ICD-10-CM | POA: Diagnosis not present

## 2021-11-01 DIAGNOSIS — Z85828 Personal history of other malignant neoplasm of skin: Secondary | ICD-10-CM | POA: Diagnosis not present

## 2021-11-01 DIAGNOSIS — L812 Freckles: Secondary | ICD-10-CM | POA: Diagnosis not present

## 2021-11-01 DIAGNOSIS — L820 Inflamed seborrheic keratosis: Secondary | ICD-10-CM | POA: Diagnosis not present

## 2021-11-01 DIAGNOSIS — Z85820 Personal history of malignant melanoma of skin: Secondary | ICD-10-CM | POA: Diagnosis not present

## 2021-11-01 DIAGNOSIS — L570 Actinic keratosis: Secondary | ICD-10-CM | POA: Diagnosis not present

## 2021-11-01 DIAGNOSIS — D485 Neoplasm of uncertain behavior of skin: Secondary | ICD-10-CM | POA: Diagnosis not present

## 2021-11-01 DIAGNOSIS — C4442 Squamous cell carcinoma of skin of scalp and neck: Secondary | ICD-10-CM | POA: Diagnosis not present

## 2021-11-01 DIAGNOSIS — D044 Carcinoma in situ of skin of scalp and neck: Secondary | ICD-10-CM | POA: Diagnosis not present

## 2021-11-02 ENCOUNTER — Telehealth: Payer: Self-pay | Admitting: *Deleted

## 2021-11-02 NOTE — Progress Notes (Signed)
°  Patient Name: Raymond Hurley MRN: [id number] DOB: [birth year] Referring Physician: Griselda Miner (Profile Not Attached) Date of Service: 09/28/2021 [facility], [place]                                                        End Of Treatment Note  Diagnoses: C44.311-Basal cell carcinoma of skin of nose  Cancer Staging:  Cancer Staging  Basal cell carcinoma of left side of nose Staging form: Cutaneous Carcinoma of the Head and Neck, AJCC 8th Edition - Clinical stage from 08/14/2021: Stage II (cT2, cN0, cM0) - Signed by Eppie Gibson, MD on 08/14/2021 Stage prefix: Initial diagnosis Extraosseous extension: Absent   Intent: Curative  Radiation Treatment Dates: 08/27/2021 through 09/28/2021 Site Technique Total Dose (Gy) Dose per Fx (Gy) Completed Fx Beam Energies  Nose: HN_L_nose Complex 52.5/52.5 2.5 21/21 6E   Narrative: The patient tolerated radiation therapy relatively well. One fraction was added to his course due to a longer than anticipated treatment break related to engineering issues with the [place].  Plan: The patient will follow-up with radiation oncology in 5mo. -----------------------------------  Eppie Gibson, MD

## 2021-11-02 NOTE — Telephone Encounter (Signed)
CALLED PATIENT TO ASK ABOUT COMING FOR FU APPT. ON 11-23-21 @ 3:20 PM, SPOKE WITH PATIENT'S WIFE AND SHE AGREED TO APPT. DATE AND TIME

## 2021-11-07 DIAGNOSIS — D509 Iron deficiency anemia, unspecified: Secondary | ICD-10-CM | POA: Diagnosis not present

## 2021-11-07 DIAGNOSIS — N186 End stage renal disease: Secondary | ICD-10-CM | POA: Diagnosis not present

## 2021-11-07 DIAGNOSIS — N2581 Secondary hyperparathyroidism of renal origin: Secondary | ICD-10-CM | POA: Diagnosis not present

## 2021-11-07 DIAGNOSIS — Z992 Dependence on renal dialysis: Secondary | ICD-10-CM | POA: Diagnosis not present

## 2021-11-07 DIAGNOSIS — D631 Anemia in chronic kidney disease: Secondary | ICD-10-CM | POA: Diagnosis not present

## 2021-11-14 DIAGNOSIS — Z23 Encounter for immunization: Secondary | ICD-10-CM | POA: Diagnosis not present

## 2021-11-14 DIAGNOSIS — Z992 Dependence on renal dialysis: Secondary | ICD-10-CM | POA: Diagnosis not present

## 2021-11-14 DIAGNOSIS — I129 Hypertensive chronic kidney disease with stage 1 through stage 4 chronic kidney disease, or unspecified chronic kidney disease: Secondary | ICD-10-CM | POA: Diagnosis not present

## 2021-11-14 DIAGNOSIS — D631 Anemia in chronic kidney disease: Secondary | ICD-10-CM | POA: Diagnosis not present

## 2021-11-14 DIAGNOSIS — D509 Iron deficiency anemia, unspecified: Secondary | ICD-10-CM | POA: Diagnosis not present

## 2021-11-14 DIAGNOSIS — N2581 Secondary hyperparathyroidism of renal origin: Secondary | ICD-10-CM | POA: Diagnosis not present

## 2021-11-14 DIAGNOSIS — N186 End stage renal disease: Secondary | ICD-10-CM | POA: Diagnosis not present

## 2021-11-20 DIAGNOSIS — Z85820 Personal history of malignant melanoma of skin: Secondary | ICD-10-CM | POA: Diagnosis not present

## 2021-11-20 DIAGNOSIS — C4442 Squamous cell carcinoma of skin of scalp and neck: Secondary | ICD-10-CM | POA: Diagnosis not present

## 2021-11-20 DIAGNOSIS — Z85828 Personal history of other malignant neoplasm of skin: Secondary | ICD-10-CM | POA: Diagnosis not present

## 2021-11-21 DIAGNOSIS — N186 End stage renal disease: Secondary | ICD-10-CM | POA: Diagnosis not present

## 2021-11-21 DIAGNOSIS — D631 Anemia in chronic kidney disease: Secondary | ICD-10-CM | POA: Diagnosis not present

## 2021-11-21 DIAGNOSIS — N2581 Secondary hyperparathyroidism of renal origin: Secondary | ICD-10-CM | POA: Diagnosis not present

## 2021-11-21 DIAGNOSIS — Z992 Dependence on renal dialysis: Secondary | ICD-10-CM | POA: Diagnosis not present

## 2021-11-21 DIAGNOSIS — Z23 Encounter for immunization: Secondary | ICD-10-CM | POA: Diagnosis not present

## 2021-11-21 DIAGNOSIS — D509 Iron deficiency anemia, unspecified: Secondary | ICD-10-CM | POA: Diagnosis not present

## 2021-11-22 NOTE — Progress Notes (Signed)
Raymond Hurley presents today for follow-up after completing radiation to his left nose on 09/28/2021  Skin: Reports area treated has healed well, and he is very pleased with the cosmetic results. Pain: Patient denies any occasional or persistent discomfort to the treatment area Dermatology F/U: Reports he saw Dr. Sarajane Jews recently for a different area of concern on his scalp. States Dr. Sarajane Jews performed Mohs surgery to the area. He has not had his surgical F/U yet to hear if the margins were clear, and whether or not radiation to this new area is warranted.  Other issues of note: Wife states he was recently started on levothyroxine and has been able to decrease his dialysis treatments to 1x week. Overall he reports he is feeling well and pleased with his continued progress

## 2021-11-23 ENCOUNTER — Ambulatory Visit
Admission: RE | Admit: 2021-11-23 | Discharge: 2021-11-23 | Disposition: A | Discharge status: Home / Self Care | Payer: Medicare Other | Source: Ambulatory Visit | Attending: Radiation Oncology | Admitting: Radiation Oncology

## 2021-11-23 ENCOUNTER — Other Ambulatory Visit: Payer: Self-pay

## 2021-11-23 VITALS — BP 126/64 | HR 68 | Temp 97.4°F | Resp 18 | Ht 70.0 in | Wt 173.8 lb

## 2021-11-23 DIAGNOSIS — Z923 Personal history of irradiation: Secondary | ICD-10-CM | POA: Diagnosis not present

## 2021-11-23 DIAGNOSIS — Z79899 Other long term (current) drug therapy: Secondary | ICD-10-CM | POA: Insufficient documentation

## 2021-11-23 DIAGNOSIS — C44311 Basal cell carcinoma of skin of nose: Secondary | ICD-10-CM | POA: Diagnosis not present

## 2021-11-26 ENCOUNTER — Encounter: Payer: Self-pay | Admitting: Radiation Oncology

## 2021-11-26 NOTE — Progress Notes (Signed)
Radiation Oncology         [phone]) [phone] ________________________________  Name: Raymond Hurley. MRN: [id number]  Date: 11/23/2021  DOB: Oct 14, 1931  Follow-Up Visit Note  Outpatient  CC: Raymond Manes, MD  Raymond Miner, MD  Diagnosis and Prior Radiotherapy:    ICD-10-CM   1. Basal cell carcinoma of left side of nose  C44.311      Radiation Treatment Dates: 08/27/2021 through 09/28/2021 Site Technique Total Dose (Gy) Dose per Fx (Gy) Completed Fx Beam Energies  Nose: HN_L_nose Complex 52.5/52.5 2.5 21/21 6E    CHIEF COMPLAINT: Here for follow-up and surveillance of skin cancer  Narrative:  The patient returns today for routine follow-up.  Mr. Raymond Hurley presents today for follow-up after completing radiation to his left nose on 09/28/2021  Skin: Reports area treated has healed well, and he is very pleased with the cosmetic results. Pain: Patient denies any occasional or persistent discomfort to the treatment area Dermatology F/U: Reports he saw Dr. Sarajane Hurley recently for a different area of concern on his scalp. States Dr. Sarajane Hurley performed Mohs surgery to the area. He has not had his surgical F/U yet to hear if the margins were clear, and whether or not radiation to this new area is warranted.  Other issues of note: Wife states he was recently started on levothyroxine and has been able to decrease his dialysis treatments to 1x week. Overall he reports he is feeling well and pleased with his continued progress                              ALLERGIES:  is allergic to tape and bee venom.  Meds: Current Outpatient Medications  Medication Sig Dispense Refill   amLODipine (NORVASC) 2.5 MG tablet Take 1 mg by mouth daily.     amiodarone (PACERONE) 200 MG tablet Take one pill twice a day till 02/25. Starting on 02/26 decrease to one pill daily. 35 tablet 0   B Complex-C-Folic Acid (RENAL VITAMIN) 0.8 MG TABS Take 1 tablet by mouth at bedtime.     docusate sodium (COLACE) 100 MG  capsule Take 100 mg by mouth 2 (two) times daily. (Patient not taking: Reported on 09/28/2021)     Ferrous Sulfate (IRON SUPPLEMENT PO) Take 1 tablet by mouth daily.     lactobacillus acidophilus & bulgar (LACTINEX) chewable tablet Chew 1 tablet by mouth 3 (three) times daily with meals. (Patient not taking: Reported on 09/28/2021) 90 tablet 0   levothyroxine (SYNTHROID) 50 MCG tablet Take 1 tablet (50 mcg total) by mouth daily before breakfast. 90 tablet 3   midodrine (PROAMATINE) 10 MG tablet Take 1 tablet (10 mg total) by mouth 3 (three) times daily. 90 tablet 0   pantoprazole (PROTONIX) 40 MG tablet Take 40 mg by mouth every morning.     polyethylene glycol powder (GLYCOLAX/MIRALAX) 17 GM/SCOOP powder Take 17 g by mouth daily as needed.     No current facility-administered medications for this encounter.    Physical Findings: The patient is in no acute distress. Patient is alert and oriented.  height is 5\' 10"  (1.778 m) and weight is 173 lb 12.8 oz (78.8 kg). His temperature is 97.4 F (36.3 C) (abnormal). His blood pressure is 126/64 and his pulse is 68. His respiration is 18 and oxygen saturation is 98%. .    Left nose has healed beautifully with no sign of residual cancer of the skin  Lab Findings:  Lab Results  Component Value Date   WBC 3.2 (L) 07/23/2021   HGB 7.0 (L) 07/23/2021   HCT 22.0 (L) 07/23/2021   MCV 112.8 (H) 07/23/2021   PLT 256 07/23/2021    Radiographic Findings: No results found.  Impression/Plan:  healed well from radiation therapy to left nose.  Continue followup with dermatology. I will see him back as needed - he and his wife are pleased with this plan.  On date of service, in total, I spent 15 minutes on this encounter. Patient was seen in person.  _____________________________________   Raymond Gibson, MD

## 2021-11-28 DIAGNOSIS — N2581 Secondary hyperparathyroidism of renal origin: Secondary | ICD-10-CM | POA: Diagnosis not present

## 2021-11-28 DIAGNOSIS — D509 Iron deficiency anemia, unspecified: Secondary | ICD-10-CM | POA: Diagnosis not present

## 2021-11-28 DIAGNOSIS — N186 End stage renal disease: Secondary | ICD-10-CM | POA: Diagnosis not present

## 2021-11-28 DIAGNOSIS — Z992 Dependence on renal dialysis: Secondary | ICD-10-CM | POA: Diagnosis not present

## 2021-11-28 DIAGNOSIS — D631 Anemia in chronic kidney disease: Secondary | ICD-10-CM | POA: Diagnosis not present

## 2021-11-28 DIAGNOSIS — R739 Hyperglycemia, unspecified: Secondary | ICD-10-CM | POA: Diagnosis not present

## 2021-11-28 DIAGNOSIS — Z23 Encounter for immunization: Secondary | ICD-10-CM | POA: Diagnosis not present

## 2021-12-03 DIAGNOSIS — D509 Iron deficiency anemia, unspecified: Secondary | ICD-10-CM | POA: Diagnosis not present

## 2021-12-03 DIAGNOSIS — Z992 Dependence on renal dialysis: Secondary | ICD-10-CM | POA: Diagnosis not present

## 2021-12-03 DIAGNOSIS — N2581 Secondary hyperparathyroidism of renal origin: Secondary | ICD-10-CM | POA: Diagnosis not present

## 2021-12-03 DIAGNOSIS — N186 End stage renal disease: Secondary | ICD-10-CM | POA: Diagnosis not present

## 2021-12-03 DIAGNOSIS — Z23 Encounter for immunization: Secondary | ICD-10-CM | POA: Diagnosis not present

## 2021-12-03 DIAGNOSIS — D631 Anemia in chronic kidney disease: Secondary | ICD-10-CM | POA: Diagnosis not present

## 2021-12-05 DIAGNOSIS — N2581 Secondary hyperparathyroidism of renal origin: Secondary | ICD-10-CM | POA: Diagnosis not present

## 2021-12-05 DIAGNOSIS — D631 Anemia in chronic kidney disease: Secondary | ICD-10-CM | POA: Diagnosis not present

## 2021-12-05 DIAGNOSIS — Z23 Encounter for immunization: Secondary | ICD-10-CM | POA: Diagnosis not present

## 2021-12-05 DIAGNOSIS — D509 Iron deficiency anemia, unspecified: Secondary | ICD-10-CM | POA: Diagnosis not present

## 2021-12-05 DIAGNOSIS — Z992 Dependence on renal dialysis: Secondary | ICD-10-CM | POA: Diagnosis not present

## 2021-12-05 DIAGNOSIS — N186 End stage renal disease: Secondary | ICD-10-CM | POA: Diagnosis not present

## 2021-12-06 ENCOUNTER — Other Ambulatory Visit: Payer: Self-pay

## 2021-12-06 ENCOUNTER — Ambulatory Visit: Payer: Medicare Other | Attending: Geriatric Medicine | Admitting: Physical Therapy

## 2021-12-06 ENCOUNTER — Encounter: Payer: Self-pay | Admitting: Physical Therapy

## 2021-12-06 DIAGNOSIS — M6281 Muscle weakness (generalized): Secondary | ICD-10-CM | POA: Diagnosis not present

## 2021-12-06 DIAGNOSIS — R2681 Unsteadiness on feet: Secondary | ICD-10-CM | POA: Diagnosis not present

## 2021-12-06 DIAGNOSIS — M25651 Stiffness of right hip, not elsewhere classified: Secondary | ICD-10-CM | POA: Insufficient documentation

## 2021-12-06 DIAGNOSIS — M25652 Stiffness of left hip, not elsewhere classified: Secondary | ICD-10-CM | POA: Insufficient documentation

## 2021-12-06 NOTE — Therapy (Signed)
OUTPATIENT PHYSICAL THERAPY THORACOLUMBAR EVALUATION   Patient Name: Raymond Hurley. MRN: [id number] DOB:14-Mar-1931, 88 y.o., male Today's Date: 12/06/2021   PT End of Session - 12/06/21 1453     Visit Number 1    Number of Visits 12    Date for PT Re-Evaluation 01/17/22    Authorization Type MCR A and B / BCBS    PT Start Time 0255    PT Stop Time 0340    PT Time Calculation (min) 45 min             Past Medical History:  Diagnosis Date   Acute pancreatitis    Atrial fibrillation (HCC)    CAD (coronary artery disease) 04/2019   CHF (congestive heart failure) (HCC)    Chronic kidney disease    Melanoma ([place])    Vitamin B 12 deficiency    Past Surgical History:  Procedure Laterality Date   CARDIOVERSION N/A 06/02/2019   Procedure: CARDIOVERSION;  Surgeon: Jerline Pain, MD;  Location: [facility];  Service: Cardiovascular;  Laterality: N/A;   IR FLUORO GUIDE CV LINE RIGHT  11/06/2020   IR US GUIDE VASC ACCESS RIGHT  11/06/2020   RIGHT/LEFT HEART CATH AND CORONARY ANGIOGRAPHY N/A 04/27/2019   Procedure: RIGHT/LEFT HEART CATH AND CORONARY ANGIOGRAPHY;  Surgeon: Jolaine Artist, MD;  Location: [facility];  Service: Cardiovascular;  Laterality: N/A;   TEE WITHOUT CARDIOVERSION N/A 06/02/2019   Procedure: TRANSESOPHAGEAL ECHOCARDIOGRAM (TEE);  Surgeon: Jerline Pain, MD;  Location[facility];  Service: Cardiovascular;  Laterality: N/A;   Patient Active Problem List   Diagnosis Date Noted   Basal cell carcinoma of left side of nose 08/14/2021   Chronic pancreatitis ([place]) 02/05/2021   Pulmonary embolus ([place]) 02/05/2021   Protein-calorie malnutrition, severe 12/06/2020   Dysphagia    Slow transit constipation    Hemodialysis-associated hypotension    Dizziness and giddiness    Acute on chronic anemia    ESRD on dialysis [facility])    Palliative care by specialist    Debility    Thrombocytopenia ([place])    Chronic systolic congestive heart failure (HCC)     Acute respiratory failure (HCC)    AKI (acute kidney injury) ([place])    Dehydration    Persistent atrial fibrillation (HCC)    Severe sepsis ([place]) 10/14/2020   Pneumonia due to COVID-19 virus 73/41/9379   Chronic systolic CHF (congestive heart failure) ([place]) 10/14/2020   Elevated troponin 10/14/2020   Facial weakness 10/14/2020   Hypocalcemia 10/14/2020   Macrocytic anemia 02/40/9735   Acute metabolic encephalopathy 32/99/2426   PAF (paroxysmal atrial fibrillation) ([place]) 07/05/2020   Anticoagulated 07/05/2020   NICM (nonischemic cardiomyopathy) ([place]) 07/05/2020   History of melanoma 07/05/2020    PCP: Lajean Manes, MD  REFERRING PROVIDER: Lajean Manes, MD  REFERRING DIAG: R26.9 (ICD-10-CM) - Unspecified abnormalities of gait and mobility  THERAPY DIAG:  Unsteadiness on feet  Muscle weakness (generalized)  Stiffness of left hip, not elsewhere classified  Stiffness of right hip, not elsewhere classified  ONSET DATE: Oct 13 2020   SUBJECTIVE:  SUBJECTIVE STATEMENT: Wife Lelon Frohlich reporting with husband.Pt developed pancreatitis  and sepsis and was hospitalized with COVID in the Fall of 2021. Spent time in Inpatient Rehab and then DC on December 07, 2020.  Came home 24 hour care due to decubitus ulcer over sacral area.  Had HHPT and continued making progress.  Wife describes his stamina was improving but his balance is not where it needs to be. Pt gave him exercises every day from the [place]:  Debility, long Hospitalization with sepsis and decubitus ulcer,   thrombocytopenia Atrial fibrilation. Melanoma,  chronic pancreatitis, ESRD (3 x a week down to 1 x a week) extensive medical hx see medical chart.  PAIN:  Are you having pain? Yes NPRS scale: 1/10 Pain location: back Pain  orientation: Bilateral  PAIN TYPE: aching Pain description: intermittent  Aggravating factors: rotation of hips, bending flexing back, walking /standing for longer than 15 min Relieving factors: Keeping back stiff, avoiding rotation  PRECAUTIONS: None  WEIGHT BEARING RESTRICTIONS No  FALLS:  Has patient fallen in last 6 months? Yes, Number of falls: 1 tripped when bringing carrying in wood.  Door hit on the way in  [place]: Lives with: lives with their spouse Lives in: House/apartment Stairs: Yes; Internal: 12 steps; can reach both Has following equipment at home: Single point cane, Walker - 2 wheeled, Environmental consultant - 4 wheeled, Wheelchair (manual), Grab bars, and Ramped entry  OCCUPATION: retired, self employed land development  PLOF: Independent  PATIENT GOALS Pt wishes to decrease risk of fall and be able to walk with less weakness. 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 No acute osseous abnormality or metastatic disease identified in the lumbar spine. 2. Posterior dermis associated 14 mm cyst at the L1 level appears benign, such as sebaceous cyst. 3. Transitional lumbosacral anatomy. Same numbering system as on the 2007 MRI designating a mostly sacralized L5 level. 4. Progressed lumbar spine degeneration since 2007. New or increased: - moderate spinal stenosis at L2-L3 and L3-L4. - moderate to severe left L4 neural foraminal stenosis. - moderate left L3 neural foraminal stenosis. -mild spinal stenosis at T12-L1 and L1-L2.  PATIENT SURVEYS:  FOTO 55% predicted 60%  SCREENING FOR RED FLAGS: Bowel or bladder incontinence: No Cauda equina syndrome: No Compression fracture: No    COGNITION:  Overall cognitive status: Within functional limits for tasks assessed    Pt is HOH SENSATION:  Light touch: Deficits numb in R ant thigha  Stereognosis: Appears  intact  Hot/Cold: Appears intact  Proprioception: Appears intact  MUSCLE LENGTH: Hamstrings: Right 50 deg; Left 50 deg Thomas test: bil tightness moderate  POSTURE:  Flexed trunk posture, PPT forward head and rounded shoulders  PALPATION: TTP over bil low back lumbar and TTP over R>L  LUMBARAROM/PROM  A/PROM A/PROM  12/06/2021  Flexion Finger tip to anterior thigh bil  Extension 0 increase with pain  Right lateral flexion 2 in above knee jt line  Left lateral flexion 2 inch above knee jt line  Right rotation 50%  Left rotation 50%   (Blank rows = not tested)  LE AROM/PROM:  A/PROM Right 12/06/2021 Left 12/06/2021  Hip flexion 70 76  Hip extension    Hip abduction    Hip adduction    Hip internal rotation 18 23  Hip external rotation 30 45  Knee flexion 130 127  Knee extension -5 -8  Ankle dorsiflexion 10 9  Ankle plantarflexion    Ankle inversion    Ankle eversion     (Blank rows = not tested)  LE MMT:  MMT Right 12/06/2021 Left 12/06/2021  Hip flexion 3- 3+  Hip extension 3- 3-  Hip abduction 3- 3-  Hip adduction    Hip internal rotation    Hip external rotation    Knee flexion 4- 4-  Knee extension 4- 4-  Ankle dorsiflexion    Ankle plantarflexion    Ankle inversion    Ankle eversion     (Blank rows = not tested)  LUMBAR SPECIAL TESTS:  Straight leg raise test: Negative, Slump test: Negative, and FABER test: Positive  FUNCTIONAL TESTS:  5 times sit to stand: 19.4 sec 6 minute walk test: TBA Berg Balance Scale: 46/56  GAIT: Distance walked: 150 feet Assistive device utilized: None Level of assistance: Complete Independence Gait Velocity   1.99 ft /sec Comments: Pt gait with decreased hip flexion  BERG BALANCE  Sitting to Standing: Numbers; 0-4: 4  4. Stands without using hands and stabilize independently  3. Stands independently using hands  2. Stands using hands after multiple trials  1. Min A to stand  0. Mod-Max A to stand Standing  unsupported: Numbers; 0-4: 4  4. Stands safely for 2 minutes  3. Stands 2 minutes with supervision  2. Stands 30 seconds unsupported  1. Needs several tries to stand unsupported for 30 seconds  0. Unable to stand unsupported for 30 seconds Sitting unsupported: Numbers; 0-4: 4  4. Sits for 2 minutes independently  3. Sits for 2 minutes with supervision  2. Able to sit 30 seconds  1. Able to sit 10 seconds  0. Unable to sit for 10 seconds Standing to Sitting: Numbers; 0-4: 4 4. Sits safely with minimal use of hands 3. Controls descent with hands 2. Uses back of legs against chair to control descent 1. Sits independently, but uncontrolled descent 0. Needs assistance Transfers: Numbers; 0-4: 4  4. Transfers safely with minor use of hands  3. Transfers safely definite use of hands  2. Transfers with verbal cueing/supervision  1. Needs 1 person assist  0. Needs 2 person assist  Standing with eyes closed: Numbers; 0-4: 4  4. Stands safely for 10 seconds  3. Stands 10 seconds with supervision   2. Able to stand for 3 seconds  1. Unable to keep eyes closed for 3 seconds, but is safe  0. Needs assist to keep from falling Standing with feet together: Numbers; 0-4: 3 4. Stands for 1 minute safely 3. Stands for 1 minute with supervision 2. Unable to hold for 30 seconds  1. Needs help to attain position but can hold for 15 seconds  0. Needs help to attain position and unable to hold for 15 seconds Reaching forward with outstretched arm: Numbers; 0-4: 3  4. Reaches forward 10 inches  3. Reaches forward 5 inches  2. Reaches forward 2 inches  1. Reaches forward with supervision  0. Loses balance/requires assistace Retrieving object from the floor: Numbers; 0-4: 4 4. Able to pick up easily and safely 3. Able to pick up with supervision 2. Unable to pick up, but reaches within 1-2 inches independently 1. Unable to pick up and needs supervision 0. Unable/needs assistance to keep from  falling  Turning to look behind: Numbers; 0-4: 4  4. Looks behind from both sides and weight shifts well  3. Looks behind one side only, other  side less weight shift  2. Turns sideways only, maintains balance  1. Needs supervision when turning  0. Needs assistance  Turning 360 degrees: Numbers; 0-4: 4  4. Able to turn in </=4 seconds  3. Able to turn on one side in </= 4 seconds   2. Able to turn slowly, but safely  1. Needs supervision or verbal cueing  0. Needs assistance Place alternate foot on stool: Numbers; 0-4: 2 4. Completes 8 steps in 20 seconds 3. Completes 8 steps in >20 seconds 2. 4 steps without assistance/supervision 1. Completes >2 steps with minimal assist 0. Unable, needs assist to keep from falling Standing with one foot in front: Numbers; 0-4: 1  4. Independent tandem for 30 seconds  3. Independent foot ahead for 30 seconds  2. Independent small step for 30 seconds  1. Needs help to step, but can hold for 15 seconds  0. Loses balance while standing/stepping Standing on one foot: Numbers; 0-4: 1 4. Holds >10 seconds 3. Holds 5-10 seconds 2. Holds >/=3 seconds  1. Holds <3 seconds 0. Unable   Total Score: 46/56   TODAY'S TREATMENT  Eval only, BERG balance   PATIENT EDUCATION:  Education details: POC Explanation of findings, BERG balance explanation FOTO report Person educated: Patient and Spouse Education method: Explanation Education comprehension: verbalized understanding and needs further education   HOME EXERCISE PROGRAM:  TBA  ASSESSMENT:  CLINICAL IMPRESSION: Patient is a 88 y.o. male accompanied by spouse who was seen today for physical therapy evaluation and treatment for balance and gait disturbances and minimal low back pain at rest 1/10 but significant with rotation of hips. Hips so poor hip dissociation form low back motion. Mr. Polio has residual balance issues due to long term hospitalization and several comorbitities.  Pt balance  appears to be attributed more to muscle weakness. Pt/wife have noted he has been less steady in the last year. Mr Lucier has been consistent with HHPT HEP but clearly needs progression for increasing strength.  Pt will benefit from skilled PT to address deconditioning from extended hospitalization with impairments of endurance strength, pain and decreased AROM as well as  impairments listed below.   OBJECTIVE IMPAIRMENTS Abnormal gait, decreased activity tolerance, decreased balance, decreased mobility, decreased ROM, decreased strength, increased fascial restrictions, improper body mechanics, postural dysfunction, and pain.   ACTIVITY LIMITATIONS  exercise and walking  .   PERSONAL FACTORS Debility, long Hospitalization with sepsis and decubitus ulcer,   thrombocytopenia Atrial fibrilation. Melanoma,  chronic pancreatitis, ESRD (3 x a week down to 1 x a week) extensive medical hx see medical chart. are also affecting patient's functional outcome.    REHAB POTENTIAL: Good  CLINICAL DECISION MAKING: Evolving/moderate complexity  EVALUATION COMPLEXITY: Moderate   GOALS: Goals reviewed with patient? No  SHORT TERM GOALS:  STG Name Target Date Goal status  1 Pt will be independent  with initial HEP Baseline: no knowledge 01/03/2022 INITIAL  2 Pt will perform 6 MWT to establish LTG Baseline: TBA 01/03/2022 INITIAL  3 Pt will be able to walk for 1/2 mile on treadmill to show increase endurance Baseline:unable to walk for longer than 10 min 01/03/2022 INITIAL  4 Pt will be able to perform 5 X STS in 13.0 sec or less to show decrease risk of fall Baseline:19.4 sec eval 01/03/2022 INITIAL   LONG TERM GOALS:   LTG Name Target Date Goal status  1 Pt will be independent with advanced HEP Baseline:no knowledge 01/31/2022 INITIAL  2  Mr Baccam with increase Gait velocity to 2.62 ft/sec in order to safely ambulate at community activity level. Baseline: Eval 1.99 ft/sec 01/31/2022 INITIAL  3 Pt will  be able to squat and carry 25 # in order to easily squat and pick up groceries Baseline: Pt avoids squatting due to decreased rotation in hips 01/31/2022 INITIAL  4 Pt will incorporate walking for exercise at least 4 x a week for a minimum of 1 mile Baseline: Pt is sedentary and sits most of day 01/31/2022 INITIAL  5 PT with be able to walk/stand >/= 1 hour with no AD with </= 2/10 pain for functional endurance and return to leisure activities post DC Baseline: Pt stands for no more than 10-15 min before fatiguing 01/31/2022 INITIAL  6 BERG balance will improve at least 5 points to help decrease risk of fall Baseline:eval 46 01/31/2022 INITIAL  7 Pt will demo floor to stand transfer to show increased LE strength and increase fall preparedness Baseline: unable to lower to floor safely 01/31/2022 INITIAL   PLAN: PT FREQUENCY: 2x/week  PT DURATION: 8 weeks  PLANNED INTERVENTIONS: Therapeutic exercises, Therapeutic activity, Neuro Muscular re-education, Balance training, Gait training, Patient/Family education, Joint mobilization, Stair training, Dry Needling, Electrical stimulation, Cryotherapy, Moist heat, Taping, Ionotophoresis 4mg /ml Dexamethasone, and Manual therapy  PLAN FOR NEXT SESSION: 6 MWT, Issue HEP for LE, hip mobility and low back stretches   Voncille Lo, PT, [facility] Certified Exercise Expert for the Aging Adult  12/06/21 10:44 PM Phone: [phone] Fax: [phone]

## 2021-12-12 DIAGNOSIS — N2581 Secondary hyperparathyroidism of renal origin: Secondary | ICD-10-CM | POA: Diagnosis not present

## 2021-12-12 DIAGNOSIS — E860 Dehydration: Secondary | ICD-10-CM | POA: Diagnosis not present

## 2021-12-12 DIAGNOSIS — M25651 Stiffness of right hip, not elsewhere classified: Secondary | ICD-10-CM | POA: Diagnosis not present

## 2021-12-12 DIAGNOSIS — M25652 Stiffness of left hip, not elsewhere classified: Secondary | ICD-10-CM | POA: Diagnosis not present

## 2021-12-12 DIAGNOSIS — R5381 Other malaise: Secondary | ICD-10-CM | POA: Diagnosis not present

## 2021-12-12 DIAGNOSIS — Z992 Dependence on renal dialysis: Secondary | ICD-10-CM | POA: Diagnosis not present

## 2021-12-12 DIAGNOSIS — Z23 Encounter for immunization: Secondary | ICD-10-CM | POA: Diagnosis not present

## 2021-12-12 DIAGNOSIS — N186 End stage renal disease: Secondary | ICD-10-CM | POA: Diagnosis not present

## 2021-12-12 DIAGNOSIS — I129 Hypertensive chronic kidney disease with stage 1 through stage 4 chronic kidney disease, or unspecified chronic kidney disease: Secondary | ICD-10-CM | POA: Diagnosis not present

## 2021-12-12 DIAGNOSIS — M6281 Muscle weakness (generalized): Secondary | ICD-10-CM | POA: Diagnosis not present

## 2021-12-12 DIAGNOSIS — R2681 Unsteadiness on feet: Secondary | ICD-10-CM | POA: Diagnosis not present

## 2021-12-12 DIAGNOSIS — D509 Iron deficiency anemia, unspecified: Secondary | ICD-10-CM | POA: Diagnosis not present

## 2021-12-12 NOTE — Therapy (Signed)
OUTPATIENT PHYSICAL THERAPY TREATMENT NOTE   Patient Name: Raymond Hurley. MRN: [id number] DOB:July 15, 1931, 88 y.o., male Today's Date: 12/13/2021  PCP: Lajean Manes, MD REFERRING PROVIDER: Lajean Manes, MD   PT End of Session - 12/13/21 1832     Visit Number 2    Number of Visits 16    Date for PT Re-Evaluation 01/31/22    Authorization Type MCR A and B / BCBS FOTO on 6th and 10th visit    Progress Note Due on Visit 10    PT Start Time 1830    PT Stop Time 1915    PT Time Calculation (min) 45 min    Equipment Utilized During Treatment Gait belt    Activity Tolerance Patient tolerated treatment well    Behavior During Therapy [facility] for tasks assessed/performed             Past Medical History:  Diagnosis Date   Acute pancreatitis    Atrial fibrillation (HCC)    CAD (coronary artery disease) 04/2019   CHF (congestive heart failure) (HCC)    Chronic kidney disease    Melanoma ([place])    Vitamin B 12 deficiency    Past Surgical History:  Procedure Laterality Date   CARDIOVERSION N/A 06/02/2019   Procedure: CARDIOVERSION;  Surgeon: Jerline Pain, MD;  Location: [facility];  Service: Cardiovascular;  Laterality: N/A;   IR FLUORO GUIDE CV LINE RIGHT  11/06/2020   IR US GUIDE VASC ACCESS RIGHT  11/06/2020   RIGHT/LEFT HEART CATH AND CORONARY ANGIOGRAPHY N/A 04/27/2019   Procedure: RIGHT/LEFT HEART CATH AND CORONARY ANGIOGRAPHY;  Surgeon: Jolaine Artist, MD;  Location: [facility];  Service: Cardiovascular;  Laterality: N/A;   TEE WITHOUT CARDIOVERSION N/A 06/02/2019   Procedure: TRANSESOPHAGEAL ECHOCARDIOGRAM (TEE);  Surgeon: Jerline Pain, MD;  Location: [facility] ENDOSCOPY;  Service: Cardiovascular;  Laterality: N/A;   Patient Active Problem List   Diagnosis Date Noted   Basal cell carcinoma of left side of nose 08/14/2021   Chronic pancreatitis ([place]) 02/05/2021   Pulmonary embolus ([place]) 02/05/2021   Protein-calorie malnutrition, severe 12/06/2020    Dysphagia    Slow transit constipation    Hemodialysis-associated hypotension    Dizziness and giddiness    Acute on chronic anemia    ESRD on dialysis [facility])    Palliative care by specialist    Debility    Thrombocytopenia ([place])    Chronic systolic congestive heart failure (HCC)    Acute respiratory failure (HCC)    AKI (acute kidney injury) ([place])    Dehydration    Persistent atrial fibrillation (HCC)    Severe sepsis ([place]) 10/14/2020   Pneumonia due to COVID-19 virus 38/25/0539   Chronic systolic CHF (congestive heart failure) ([place]) 10/14/2020   Elevated troponin 10/14/2020   Facial weakness 10/14/2020   Hypocalcemia 10/14/2020   Macrocytic anemia 76/73/4193   Acute metabolic encephalopathy 79/11/4095   PAF (paroxysmal atrial fibrillation) ([place]) 07/05/2020   Anticoagulated 07/05/2020   NICM (nonischemic cardiomyopathy) ([place]) 07/05/2020   History of melanoma 07/05/2020    REFERRING DIAG: R26.9 (ICD-10-CM) - Unspecified abnormalities of gait and mobility  THERAPY DIAG:  Unsteadiness on feet  Muscle weakness (generalized)  Stiffness of left hip, not elsewhere classified  Stiffness of right hip, not elsewhere classified  PERTINENT HISTORY: Debility, long Hospitalization with sepsis and decubitus ulcer,   thrombocytopenia Atrial fibrilation. Melanoma,  chronic pancreatitis, ESRD (3 x a week down to 1 x a week) extensive medical hx see medical  chart.   SUBJECTIVE: Pt reports no pain today, only continued problems with balance.  PAIN:  Are you having pain? Yes NPRS scale: 0/10 Pain location: back Pain orientation: Bilateral  PAIN TYPE: aching Pain description: intermittent  Aggravating factors: rotation of hips, bending flexing back, walking /standing for longer than 15 min Relieving factors: Keeping back stiff, avoiding rotation     OBJECTIVE:   *Unless otherwise noted, objective measures collected previously*  DIAGNOSTIC FINDINGS: 11/12/17 IMPRESSION: 1. No  acute osseous abnormality or metastatic disease identified in the lumbar spine. 2. Posterior dermis associated 14 mm cyst at the L1 level appears benign, such as sebaceous cyst. 3. Transitional lumbosacral anatomy. Same numbering system as on the 2007 MRI designating a mostly sacralized L5 level. 4. Progressed lumbar spine degeneration since 2007. New or increased: - moderate spinal stenosis at L2-L3 and L3-L4. - moderate to severe left L4 neural foraminal stenosis. - moderate left L3 neural foraminal stenosis. -mild spinal stenosis at T12-L1 and L1-L2.   PATIENT SURVEYS:  FOTO 55% predicted 60%   SCREENING FOR RED FLAGS: Bowel or bladder incontinence: No Cauda equina syndrome: No Compression fracture: No     COGNITION:          Overall cognitive status: Within functional limits for tasks assessed                       Pt is HOH SENSATION:          Light touch: Deficits numb in R ant thigha          Stereognosis: Appears intact          Hot/Cold: Appears intact          Proprioception: Appears intact   MUSCLE LENGTH: Hamstrings: Right 50 deg; Left 50 deg Thomas test: bil tightness moderate   POSTURE:  Flexed trunk posture, PPT forward head and rounded shoulders   PALPATION: TTP over bil low back lumbar and TTP over R>L   LUMBARAROM/PROM   A/PROM A/PROM  12/06/2021  Flexion Finger tip to anterior thigh bil  Extension 0 increase with pain  Right lateral flexion 2 in above knee jt line  Left lateral flexion 2 inch above knee jt line  Right rotation 50%  Left rotation 50%   (Blank rows = not tested)   LE AROM/PROM:   A/PROM Right 12/06/2021 Left 12/06/2021  Hip flexion 70 76  Hip extension      Hip abduction      Hip adduction      Hip internal rotation 18 23  Hip external rotation 30 45  Knee flexion 130 127  Knee extension -5 -8  Ankle dorsiflexion 10 9  Ankle plantarflexion      Ankle inversion      Ankle eversion       (Blank rows = not tested)    LE MMT:   MMT Right 12/06/2021 Left 12/06/2021  Hip flexion 3- 3+  Hip extension 3- 3-  Hip abduction 3- 3-  Hip adduction      Hip internal rotation      Hip external rotation      Knee flexion 4- 4-  Knee extension 4- 4-  Ankle dorsiflexion      Ankle plantarflexion      Ankle inversion      Ankle eversion       (Blank rows = not tested)   LUMBAR SPECIAL TESTS:  Straight leg raise test: Negative, Slump test: Negative,  and FABER test: Positive   FUNCTIONAL TESTS:  5 times sit to stand: 19.4 sec 6 minute walk test: TBA Berg Balance Scale: 46/56   GAIT: Distance walked: 150 feet Assistive device utilized: None Level of assistance: Complete Independence Gait Velocity   1.99 ft /sec Comments: Pt gait with decreased hip flexion   BERG BALANCE   Sitting to Standing: Numbers; 0-4: 4      4. Stands without using hands and stabilize independently      3. Stands independently using hands      2. Stands using hands after multiple trials      1. Min A to stand      0. Mod-Max A to stand Standing unsupported: Numbers; 0-4: 4      4. Stands safely for 2 minutes      3. Stands 2 minutes with supervision      2. Stands 30 seconds unsupported      1. Needs several tries to stand unsupported for 30 seconds      0. Unable to stand unsupported for 30 seconds Sitting unsupported: Numbers; 0-4: 4      4. Sits for 2 minutes independently      3. Sits for 2 minutes with supervision      2. Able to sit 30 seconds      1. Able to sit 10 seconds      0. Unable to sit for 10 seconds Standing to Sitting: Numbers; 0-4: 4 4. Sits safely with minimal use of hands 3. Controls descent with hands 2. Uses back of legs against chair to control descent 1. Sits independently, but uncontrolled descent 0. Needs assistance Transfers: Numbers; 0-4: 4      4. Transfers safely with minor use of hands      3. Transfers safely definite use of hands      2. Transfers with verbal cueing/supervision       1. Needs 1 person assist      0. Needs 2 person assist  Standing with eyes closed: Numbers; 0-4: 4      4. Stands safely for 10 seconds      3. Stands 10 seconds with supervision       2. Able to stand for 3 seconds      1. Unable to keep eyes closed for 3 seconds, but is safe      0. Needs assist to keep from falling Standing with feet together: Numbers; 0-4: 3 4. Stands for 1 minute safely 3. Stands for 1 minute with supervision 2. Unable to hold for 30 seconds      1. Needs help to attain position but can hold for 15 seconds      0. Needs help to attain position and unable to hold for 15 seconds Reaching forward with outstretched arm: Numbers; 0-4: 3      4. Reaches forward 10 inches      3. Reaches forward 5 inches      2. Reaches forward 2 inches      1. Reaches forward with supervision      0. Loses balance/requires assistace Retrieving object from the floor: Numbers; 0-4: 4 4. Able to pick up easily and safely 3. Able to pick up with supervision 2. Unable to pick up, but reaches within 1-2 inches independently 1. Unable to pick up and needs supervision 0. Unable/needs assistance to keep from falling  Turning to look behind: Numbers; 0-4: 4  4. Looks behind from both sides and weight shifts well      3. Looks behind one side only, other side less weight shift      2. Turns sideways only, maintains balance      1. Needs supervision when turning      0. Needs assistance  Turning 360 degrees: Numbers; 0-4: 4      4. Able to turn in </=4 seconds      3. Able to turn on one side in </= 4 seconds       2. Able to turn slowly, but safely      1. Needs supervision or verbal cueing      0. Needs assistance Place alternate foot on stool: Numbers; 0-4: 2 4. Completes 8 steps in 20 seconds 3. Completes 8 steps in >20 seconds 2. 4 steps without assistance/supervision 1. Completes >2 steps with minimal assist 0. Unable, needs assist to keep from falling Standing with one  foot in front: Numbers; 0-4: 1      4. Independent tandem for 30 seconds      3. Independent foot ahead for 30 seconds      2. Independent small step for 30 seconds      1. Needs help to step, but can hold for 15 seconds      0. Loses balance while standing/stepping Standing on one foot: Numbers; 0-4: 1 4. Holds >10 seconds 3. Holds 5-10 seconds 2. Holds >/=3 seconds  1. Holds <3 seconds 0. Unable    Total Score: 46/56    12/13/2021: 6MWT: 630ft; 1.44ft/sec  TODAY'S TREATMENT   [facility] Adult PT Treatment:                                                DATE: 12/13/2021 Therapeutic Exercise: Sumo squat side steps x4 laps in // bars Heel raises on edge of Airex pad with slow descent 3x15 Manual Therapy: N/A Neuromuscular re-ed: Retro walking with PT resistance and random removal of resistance x3 laps in // bars Tandem walking with random lateral perturbations from PT x4 laps in // bars Forward step-up with knee drive and slow descent 3x10 BIL Therapeutic Activity: 6MWT HEP demonstration and issuing Modalities: N/A Self Care: N/A      PATIENT EDUCATION:  Education details: HEP Person educated: Patient  Education method: Consulting civil engineer, demonstration, handout Education comprehension: verbalized understanding and needs further education     HOME EXERCISE PROGRAM: Access Code: [email or web address] URL: [email or web address] Date: 12/13/2021 Prepared by: Vanessa Oxford  Exercises Single Leg Balance Clock WITH UPPER EXTREMITY SUPPORT - 1 x daily - 7 x weekly - 3 sets - 5 reps Mini Squat with Counter Support - 1 x daily - 7 x weekly - 3 sets - 10 reps Sidelying Hip Abduction - 1 x daily - 7 x weekly - 2 sets - 10 reps - 3-sec hold Supine Bridge - 1 x daily - 7 x weekly - 3 sets - 10 reps - 3-sec hold    ASSESSMENT:   CLINICAL IMPRESSION: Pt responded well to all interventions today, demonstrating in-session improvement in reactionary balance strategy with selected  exercises. He will benefit from continued LE strengthening in addition to progressive balance training/ falls prevention. The pt's 6MWT indicates lower than expected gait speed and high fall risk with ambulation. The pt will benefit from continued skilled  PT to address his primary impairments and return to his prior level of function with less limitation.     OBJECTIVE IMPAIRMENTS Abnormal gait, decreased activity tolerance, decreased balance, decreased mobility, decreased ROM, decreased strength, increased fascial restrictions, improper body mechanics, postural dysfunction, and pain.    ACTIVITY LIMITATIONS  exercise and walking  .    PERSONAL FACTORS Debility, long Hospitalization with sepsis and decubitus ulcer,   thrombocytopenia Atrial fibrilation. Melanoma,  chronic pancreatitis, ESRD (3 x a week down to 1 x a week) extensive medical hx see medical chart. are also affecting patient's functional outcome.      REHAB POTENTIAL: Good   CLINICAL DECISION MAKING: Evolving/moderate complexity   EVALUATION COMPLEXITY: Moderate     GOALS: Goals reviewed with patient? No   SHORT TERM GOALS:   STG Name Target Date Goal status  1 Pt will be independent  with initial HEP Baseline: no knowledge 01/03/2022 INITIAL  2 Pt will perform 6 MWT to establish LTG Baseline: TBA 01/03/2022 INITIAL  3 Pt will be able to walk for 1/2 mile on treadmill to show increase endurance Baseline:unable to walk for longer than 10 min 01/03/2022 INITIAL  4 Pt will be able to perform 5 X STS in 13.0 sec or less to show decrease risk of fall Baseline:19.4 sec eval 01/03/2022 INITIAL    LONG TERM GOALS:    LTG Name Target Date Goal status  1 Pt will be independent with advanced HEP Baseline:no knowledge 01/31/2022 INITIAL  2 Mr Arteaga with increase Gait velocity to 2.62 ft/sec in order to safely ambulate at community activity level. Baseline: Eval 1.99 ft/sec 01/31/2022 INITIAL  3 Pt will be able to squat and carry 25  # in order to easily squat and pick up groceries Baseline: Pt avoids squatting due to decreased rotation in hips 01/31/2022 INITIAL  4 Pt will incorporate walking for exercise at least 4 x a week for a minimum of 1 mile Baseline: Pt is sedentary and sits most of day 01/31/2022 INITIAL  5 PT with be able to walk/stand >/= 1 hour with no AD with </= 2/10 pain for functional endurance and return to leisure activities post DC Baseline: Pt stands for no more than 10-15 min before fatiguing 01/31/2022 INITIAL  6 BERG balance will improve at least 5 points to help decrease risk of fall Baseline:eval 46 01/31/2022 INITIAL  7 Pt will demo floor to stand transfer to show increased LE strength and increase fall preparedness Baseline: unable to lower to floor safely 01/31/2022 INITIAL    PLAN: PT FREQUENCY: 2x/week   PT DURATION: 8 weeks   PLANNED INTERVENTIONS: Therapeutic exercises, Therapeutic activity, Neuro Muscular re-education, Balance training, Gait training, Patient/Family education, Joint mobilization, Stair training, Dry Needling, Electrical stimulation, Cryotherapy, Moist heat, Taping, Ionotophoresis 4mg /ml Dexamethasone, and Manual therapy   PLAN FOR NEXT SESSION: continue balance/ LE progression    Vanessa Smolan, PT, DPT 12/13/21 7:16 PM

## 2021-12-13 ENCOUNTER — Ambulatory Visit: Payer: Medicare Other | Attending: Geriatric Medicine

## 2021-12-13 ENCOUNTER — Other Ambulatory Visit (HOSPITAL_BASED_OUTPATIENT_CLINIC_OR_DEPARTMENT_OTHER): Payer: Self-pay

## 2021-12-13 ENCOUNTER — Encounter: Payer: Medicare Other | Admitting: Physical Therapy

## 2021-12-13 ENCOUNTER — Other Ambulatory Visit: Payer: Self-pay

## 2021-12-13 DIAGNOSIS — R2681 Unsteadiness on feet: Secondary | ICD-10-CM | POA: Insufficient documentation

## 2021-12-13 DIAGNOSIS — E860 Dehydration: Secondary | ICD-10-CM | POA: Insufficient documentation

## 2021-12-13 DIAGNOSIS — R5381 Other malaise: Secondary | ICD-10-CM | POA: Diagnosis not present

## 2021-12-13 DIAGNOSIS — M25651 Stiffness of right hip, not elsewhere classified: Secondary | ICD-10-CM | POA: Diagnosis not present

## 2021-12-13 DIAGNOSIS — M25652 Stiffness of left hip, not elsewhere classified: Secondary | ICD-10-CM | POA: Diagnosis not present

## 2021-12-13 DIAGNOSIS — M6281 Muscle weakness (generalized): Secondary | ICD-10-CM | POA: Diagnosis not present

## 2021-12-18 ENCOUNTER — Ambulatory Visit: Payer: Medicare Other | Admitting: Physical Therapy

## 2021-12-18 ENCOUNTER — Other Ambulatory Visit: Payer: Self-pay

## 2021-12-18 DIAGNOSIS — R5381 Other malaise: Secondary | ICD-10-CM | POA: Diagnosis not present

## 2021-12-18 DIAGNOSIS — M6281 Muscle weakness (generalized): Secondary | ICD-10-CM | POA: Diagnosis not present

## 2021-12-18 DIAGNOSIS — E860 Dehydration: Secondary | ICD-10-CM | POA: Diagnosis not present

## 2021-12-18 DIAGNOSIS — R2681 Unsteadiness on feet: Secondary | ICD-10-CM | POA: Diagnosis not present

## 2021-12-18 DIAGNOSIS — M25652 Stiffness of left hip, not elsewhere classified: Secondary | ICD-10-CM

## 2021-12-18 DIAGNOSIS — M25651 Stiffness of right hip, not elsewhere classified: Secondary | ICD-10-CM

## 2021-12-18 NOTE — Therapy (Signed)
OUTPATIENT PHYSICAL THERAPY TREATMENT NOTE   Patient Name: Raymond Hurley. MRN: [id number] DOB:05-12-1931, 88 y.o., male Today's Date: 12/18/2021  PCP: Lajean Manes, MD REFERRING PROVIDER: Lajean Manes, MD   PT End of Session - 12/18/21 1547     Visit Number 3    Number of Visits 16    Date for PT Re-Evaluation 01/31/22    Authorization Type MCR A and B / BCBS FOTO on 6th and 10th visit    Progress Note Due on Visit 10    PT Start Time 1548    PT Stop Time 1629    PT Time Calculation (min) 41 min    Equipment Utilized During Treatment Gait belt    Activity Tolerance Patient tolerated treatment well    Behavior During Therapy WFL for tasks assessed/performed              Past Medical History:  Diagnosis Date   Acute pancreatitis    Atrial fibrillation (HCC)    CAD (coronary artery disease) 04/2019   CHF (congestive heart failure) (HCC)    Chronic kidney disease    Melanoma ([place])    Vitamin B 12 deficiency    Past Surgical History:  Procedure Laterality Date   CARDIOVERSION N/A 06/02/2019   Procedure: CARDIOVERSION;  Surgeon: Jerline Pain, MD;  Location: [facility];  Service: Cardiovascular;  Laterality: N/A;   IR FLUORO GUIDE CV LINE RIGHT  11/06/2020   IR US GUIDE VASC ACCESS RIGHT  11/06/2020   RIGHT/LEFT HEART CATH AND CORONARY ANGIOGRAPHY N/A 04/27/2019   Procedure: RIGHT/LEFT HEART CATH AND CORONARY ANGIOGRAPHY;  Surgeon: Jolaine Artist, MD;  Location: [facility];  Service: Cardiovascular;  Laterality: N/A;   TEE WITHOUT CARDIOVERSION N/A 06/02/2019   Procedure: TRANSESOPHAGEAL ECHOCARDIOGRAM (TEE);  Surgeon: Jerline Pain, MD;  Location: [facility] ENDOSCOPY;  Service: Cardiovascular;  Laterality: N/A;   Patient Active Problem List   Diagnosis Date Noted   Basal cell carcinoma of left side of nose 08/14/2021   Chronic pancreatitis ([place]) 02/05/2021   Pulmonary embolus ([place]) 02/05/2021   Protein-calorie malnutrition, severe 12/06/2020    Dysphagia    Slow transit constipation    Hemodialysis-associated hypotension    Dizziness and giddiness    Acute on chronic anemia    ESRD on dialysis [facility])    Palliative care by specialist    Debility    Thrombocytopenia ([place])    Chronic systolic congestive heart failure (HCC)    Acute respiratory failure (HCC)    AKI (acute kidney injury) ([place])    Dehydration    Persistent atrial fibrillation (HCC)    Severe sepsis ([place]) 10/14/2020   Pneumonia due to COVID-19 virus 03/50/0938   Chronic systolic CHF (congestive heart failure) ([place]) 10/14/2020   Elevated troponin 10/14/2020   Facial weakness 10/14/2020   Hypocalcemia 10/14/2020   Macrocytic anemia 18/29/9371   Acute metabolic encephalopathy 69/67/8938   PAF (paroxysmal atrial fibrillation) ([place]) 07/05/2020   Anticoagulated 07/05/2020   NICM (nonischemic cardiomyopathy) ([place]) 07/05/2020   History of melanoma 07/05/2020    REFERRING DIAG: R26.9 (ICD-10-CM) - Unspecified abnormalities of gait and mobility  THERAPY DIAG:  Unsteadiness on feet  Stiffness of right hip, not elsewhere classified  Muscle weakness (generalized)  Dehydration  Stiffness of left hip, not elsewhere classified  Debility  PERTINENT HISTORY: Debility, long Hospitalization with sepsis and decubitus ulcer,   thrombocytopenia Atrial fibrilation. Melanoma,  chronic pancreatitis, ESRD (3 x a week down to 1 x a week)  extensive medical hx see medical chart.   SUBJECTIVE: Pt reports no pain today, only continued problems with balance.  PAIN:  Are you having pain? Yes NPRS scale: 4//10 Pain location: back R hip Pain orientation: Bilateral  PAIN TYPE: aching Pain description: intermittent  Aggravating factors: rotation of hips, bending flexing back, walking /standing for longer than 15 min Relieving factors: Keeping back stiff, avoiding rotation     OBJECTIVE:   *Unless otherwise noted, objective measures collected previously*  DIAGNOSTIC  FINDINGS: 11/12/17 IMPRESSION: 1. No acute osseous abnormality or metastatic disease identified in the lumbar spine. 2. Posterior dermis associated 14 mm cyst at the L1 level appears benign, such as sebaceous cyst. 3. Transitional lumbosacral anatomy. Same numbering system as on the 2007 MRI designating a mostly sacralized L5 level. 4. Progressed lumbar spine degeneration since 2007. New or increased: - moderate spinal stenosis at L2-L3 and L3-L4. - moderate to severe left L4 neural foraminal stenosis. - moderate left L3 neural foraminal stenosis. -mild spinal stenosis at T12-L1 and L1-L2.   PATIENT SURVEYS:  FOTO 55% predicted 60%   SCREENING FOR RED FLAGS: Bowel or bladder incontinence: No Cauda equina syndrome: No Compression fracture: No     COGNITION:          Overall cognitive status: Within functional limits for tasks assessed                       Pt is HOH SENSATION:          Light touch: Deficits numb in R ant thigha          Stereognosis: Appears intact          Hot/Cold: Appears intact          Proprioception: Appears intact   MUSCLE LENGTH: Hamstrings: Right 50 deg; Left 50 deg Thomas test: bil tightness moderate   POSTURE:  Flexed trunk posture, PPT forward head and rounded shoulders   PALPATION: TTP over bil low back lumbar and TTP over R>L   LUMBARAROM/PROM   A/PROM A/PROM  12/06/2021  Flexion Finger tip to anterior thigh bil  Extension 0 increase with pain  Right lateral flexion 2 in above knee jt line  Left lateral flexion 2 inch above knee jt line  Right rotation 50%  Left rotation 50%   (Blank rows = not tested)   LE AROM/PROM:   A/PROM Right 12/06/2021 Left 12/06/2021  Hip flexion 70 76  Hip extension      Hip abduction      Hip adduction      Hip internal rotation 18 23  Hip external rotation 30 45  Knee flexion 130 127  Knee extension -5 -8  Ankle dorsiflexion 10 9  Ankle plantarflexion      Ankle inversion      Ankle eversion        (Blank rows = not tested)   LE MMT:   MMT Right 12/06/2021 Left 12/06/2021  Hip flexion 3- 3+  Hip extension 3- 3-  Hip abduction 3- 3-  Hip adduction      Hip internal rotation      Hip external rotation      Knee flexion 4- 4-  Knee extension 4- 4-  Ankle dorsiflexion      Ankle plantarflexion      Ankle inversion      Ankle eversion       (Blank rows = not tested)   LUMBAR SPECIAL TESTS:  Straight  leg raise test: Negative, Slump test: Negative, and FABER test: Positive   FUNCTIONAL TESTS:  5 times sit to stand: 19.4 sec 6 minute walk test: TBA Berg Balance Scale: 46/56   GAIT: Distance walked: 150 feet Assistive device utilized: None Level of assistance: Complete Independence Gait Velocity   1.99 ft /sec Comments: Pt gait with decreased hip flexion   BERG BALANCE   Sitting to Standing: Numbers; 0-4: 4      4. Stands without using hands and stabilize independently      3. Stands independently using hands      2. Stands using hands after multiple trials      1. Min A to stand      0. Mod-Max A to stand Standing unsupported: Numbers; 0-4: 4      4. Stands safely for 2 minutes      3. Stands 2 minutes with supervision      2. Stands 30 seconds unsupported      1. Needs several tries to stand unsupported for 30 seconds      0. Unable to stand unsupported for 30 seconds Sitting unsupported: Numbers; 0-4: 4      4. Sits for 2 minutes independently      3. Sits for 2 minutes with supervision      2. Able to sit 30 seconds      1. Able to sit 10 seconds      0. Unable to sit for 10 seconds Standing to Sitting: Numbers; 0-4: 4 4. Sits safely with minimal use of hands 3. Controls descent with hands 2. Uses back of legs against chair to control descent 1. Sits independently, but uncontrolled descent 0. Needs assistance Transfers: Numbers; 0-4: 4      4. Transfers safely with minor use of hands      3. Transfers safely definite use of hands      2. Transfers  with verbal cueing/supervision      1. Needs 1 person assist      0. Needs 2 person assist  Standing with eyes closed: Numbers; 0-4: 4      4. Stands safely for 10 seconds      3. Stands 10 seconds with supervision       2. Able to stand for 3 seconds      1. Unable to keep eyes closed for 3 seconds, but is safe      0. Needs assist to keep from falling Standing with feet together: Numbers; 0-4: 3 4. Stands for 1 minute safely 3. Stands for 1 minute with supervision 2. Unable to hold for 30 seconds      1. Needs help to attain position but can hold for 15 seconds      0. Needs help to attain position and unable to hold for 15 seconds Reaching forward with outstretched arm: Numbers; 0-4: 3      4. Reaches forward 10 inches      3. Reaches forward 5 inches      2. Reaches forward 2 inches      1. Reaches forward with supervision      0. Loses balance/requires assistace Retrieving object from the floor: Numbers; 0-4: 4 4. Able to pick up easily and safely 3. Able to pick up with supervision 2. Unable to pick up, but reaches within 1-2 inches independently 1. Unable to pick up and needs supervision 0. Unable/needs assistance to keep from falling  Turning to look behind: Numbers;  0-4: 4      4. Looks behind from both sides and weight shifts well      3. Looks behind one side only, other side less weight shift      2. Turns sideways only, maintains balance      1. Needs supervision when turning      0. Needs assistance  Turning 360 degrees: Numbers; 0-4: 4      4. Able to turn in </=4 seconds      3. Able to turn on one side in </= 4 seconds       2. Able to turn slowly, but safely      1. Needs supervision or verbal cueing      0. Needs assistance Place alternate foot on stool: Numbers; 0-4: 2 4. Completes 8 steps in 20 seconds 3. Completes 8 steps in >20 seconds 2. 4 steps without assistance/supervision 1. Completes >2 steps with minimal assist 0. Unable, needs assist to keep  from falling Standing with one foot in front: Numbers; 0-4: 1      4. Independent tandem for 30 seconds      3. Independent foot ahead for 30 seconds      2. Independent small step for 30 seconds      1. Needs help to step, but can hold for 15 seconds      0. Loses balance while standing/stepping Standing on one foot: Numbers; 0-4: 1 4. Holds >10 seconds 3. Holds 5-10 seconds 2. Holds >/=3 seconds  1. Holds <3 seconds 0. Unable    Total Score: 46/56    12/13/2021: 6MWT: 680f; 1.63fsec  TODAY'S TREATMENT  [facility] Adult PT Treatment:                                                DATE: 12-18-21 Therapeutic Exercise: Standing at counter with UE support hip extension RTB 2 x 10 Standing at counter with UE support hip flexion RTB 2 x 10 Standing at counter with UE support hip flexion RTB 2 x 10 Heel raises on edge of Airex pad with slow descent 3x15 Sit to stand with 15 # KB 2 x 8 Childs pose stretch in quadriped, and forward and to both side within pt tolerance  Neuromuscular re-ed:  Retro walking with PT resistance and random removal of resistance down 4 lengths of counter in clinic Standing wall clock balance with toe taps,  SLS on There ex pad with UE support on counter Tandem walking with random lateral perturbations from PT x4 lengths of counter in clinic  while holding onto counter  [facility]                                                [facility]: 12[facility]20[facility] Therapeutic Exercise: Sumo squat side steps x4 laps in // bars Heel raises on edge of Airex pad with slow descent 3x15 Manual Therapy: N/A Neuromuscular re-ed: Retro walking with PT resistance and random removal of resistance x3 laps in // bars Tandem walking with random lateral perturbations from PT x4 laps in // bars Forward step-up with knee drive and slow descent 3x10 BIL Therapeutic Activity: 6MWT HEP demonstration and issuing Modalities: N/A Self Care: N/A  PATIENT EDUCATION:  Education  details: HEP Person educated: Patient  Education method: Consulting civil engineer, demonstration, handout Education comprehension: verbalized understanding and needs further education     HOME EXERCISE PROGRAM: Access Code: [email or web address] URL: [email or web address] Date: 12/13/2021 Prepared by: Vanessa [place]  Exercises Single Leg Balance Clock WITH UPPER EXTREMITY SUPPORT - 1 x daily - 7 x weekly - 3 sets - 5 reps Mini Squat with Counter Support - 1 x daily - 7 x weekly - 3 sets - 10 reps Sidelying Hip Abduction - 1 x daily - 7 x weekly - 2 sets - 10 reps - 3-sec hold Supine Bridge - 1 x daily - 7 x weekly - 3 sets - 10 reps - 3-sec hold Access Code: [email or web address] Added 12-18-21  Standing Hip Extension with Resistance at Ankles and Counter Support - 1 x daily - 7 x weekly - 3 sets - 10 reps Standing Hip Flexion with Resistance at Ankles and Counter Support - 1 x daily - 7 x weekly - 3 sets - 10 reps Standing Hip Abduction with Resistance at Ankles and Counter Support - 1 x daily - 7 x weekly - 3 sets - 10 reps    ASSESSMENT:   CLINICAL IMPRESSION: Mr Heesch enters clinic without AD.  Pt is very responsive to all suggestions and eager to participate with balance interventions. Pt responded well to all interventions today, demonstrating in-session improvement in reactionary balance strategy with selected exercises. He will benefit from continued LE strengthening in addition to progressive balance training/ falls prevention.  The pt will benefit from continued skilled PT to address his primary impairments and return to his prior level of function with less limitation.     OBJECTIVE IMPAIRMENTS Abnormal gait, decreased activity tolerance, decreased balance, decreased mobility, decreased ROM, decreased strength, increased fascial restrictions, improper body mechanics, postural dysfunction, and pain.    ACTIVITY LIMITATIONS  exercise and walking  .    PERSONAL FACTORS Debility, long  Hospitalization with sepsis and decubitus ulcer,   thrombocytopenia Atrial fibrilation. Melanoma,  chronic pancreatitis, ESRD (3 x a week down to 1 x a week) extensive medical hx see medical chart. are also affecting patient's functional outcome.      REHAB POTENTIAL: Good   CLINICAL DECISION MAKING: Evolving/moderate complexity   EVALUATION COMPLEXITY: Moderate     GOALS: Goals reviewed with patient? No   SHORT TERM GOALS:   STG Name Target Date Goal status  1 Pt will be independent  with initial HEP Baseline: no knowledge 01/03/2022 INITIAL  2 Pt will perform 6 MWT to establish LTG Baseline: TBA 01/03/2022 INITIAL  3 Pt will be able to walk for 1/2 mile on treadmill to show increase endurance Baseline:unable to walk for longer than 10 min 01/03/2022 INITIAL  4 Pt will be able to perform 5 X STS in 13.0 sec or less to show decrease risk of fall Baseline:19.4 sec eval 01/03/2022 INITIAL    LONG TERM GOALS:    LTG Name Target Date Goal status  1 Pt will be independent with advanced HEP Baseline:no knowledge 01/31/2022 INITIAL  2 Mr [place] with increase Gait velocity to 2.62 ft/sec in order to safely ambulate at community activity level. Baseline: Eval 1.99 ft/sec 01/31/2022 INITIAL  3 Pt will be able to squat and carry 25 # in order to easily squat and pick up groceries Baseline: Pt avoids squatting due to decreased rotation in hips 01/31/2022 INITIAL  4 Pt will incorporate walking for exercise at least 4 x a  week for a minimum of 1 mile Baseline: Pt is sedentary and sits most of day 01/31/2022 INITIAL  5 PT with be able to walk/stand >/= 1 hour with no AD with </= 2/10 pain for functional endurance and return to leisure activities post DC Baseline: Pt stands for no more than 10-15 min before fatiguing 01/31/2022 INITIAL  6 BERG balance will improve at least 5 points to help decrease risk of fall Baseline:eval 46 01/31/2022 INITIAL  7 Pt will demo floor to stand transfer to show  increased LE strength and increase fall preparedness Baseline: unable to lower to floor safely 01/31/2022 INITIAL    PLAN: PT FREQUENCY: 2x/week   PT DURATION: 8 weeks   PLANNED INTERVENTIONS: Therapeutic exercises, Therapeutic activity, Neuro Muscular re-education, Balance training, Gait training, Patient/Family education, Joint mobilization, Stair training, Dry Needling, Electrical stimulation, Cryotherapy, Moist heat, Taping, Ionotophoresis '4mg'$ /ml Dexamethasone, and Manual therapy   PLAN FOR NEXT SESSION: continue balance/ LE progression check Sit to stand and reactionary balance So 6 MWT    Voncille Lo, PT, [facility] Certified Exercise Expert for the Aging Adult  12/18/21 6:02 PM Phone: [phone] Fax: [phone]

## 2021-12-19 DIAGNOSIS — N186 End stage renal disease: Secondary | ICD-10-CM | POA: Diagnosis not present

## 2021-12-19 DIAGNOSIS — Z992 Dependence on renal dialysis: Secondary | ICD-10-CM | POA: Diagnosis not present

## 2021-12-19 DIAGNOSIS — Z23 Encounter for immunization: Secondary | ICD-10-CM | POA: Diagnosis not present

## 2021-12-19 DIAGNOSIS — N2581 Secondary hyperparathyroidism of renal origin: Secondary | ICD-10-CM | POA: Diagnosis not present

## 2021-12-19 DIAGNOSIS — D509 Iron deficiency anemia, unspecified: Secondary | ICD-10-CM | POA: Diagnosis not present

## 2021-12-19 NOTE — Therapy (Signed)
OUTPATIENT PHYSICAL THERAPY TREATMENT NOTE   Patient Name: Raymond Hurley. MRN: [id number] DOB:1931/04/07, 88 y.o., male Today's Date: 12/20/2021  PCP: Lajean Manes, MD REFERRING PROVIDER: Lajean Manes, MD   PT End of Session - 12/20/21 1659     Visit Number 4    Number of Visits 16    Date for PT Re-Evaluation 01/31/22    Authorization Type MCR A and B / BCBS FOTO on 6th and 10th visit    Progress Note Due on Visit 10    PT Start Time 1547    PT Stop Time 1635    PT Time Calculation (min) 48 min    Activity Tolerance Patient tolerated treatment well    Behavior During Therapy [facility] for tasks assessed/performed               Past Medical History:  Diagnosis Date   Acute pancreatitis    Atrial fibrillation (HCC)    CAD (coronary artery disease) 04/2019   CHF (congestive heart failure) (HCC)    Chronic kidney disease    Melanoma ([place])    Vitamin B 12 deficiency    Past Surgical History:  Procedure Laterality Date   CARDIOVERSION N/A 06/02/2019   Procedure: CARDIOVERSION;  Surgeon: Jerline Pain, MD;  Location: [facility];  Service: Cardiovascular;  Laterality: N/A;   IR FLUORO GUIDE CV LINE RIGHT  11/06/2020   IR US GUIDE VASC ACCESS RIGHT  11/06/2020   RIGHT/LEFT HEART CATH AND CORONARY ANGIOGRAPHY N/A 04/27/2019   Procedure: RIGHT/LEFT HEART CATH AND CORONARY ANGIOGRAPHY;  Surgeon: Jolaine Artist, MD;  Location: [facility];  Service: Cardiovascular;  Laterality: N/A;   TEE WITHOUT CARDIOVERSION N/A 06/02/2019   Procedure: TRANSESOPHAGEAL ECHOCARDIOGRAM (TEE);  Surgeon: Jerline Pain, MD;  Location: [facility] ENDOSCOPY;  Service: Cardiovascular;  Laterality: N/A;   Patient Active Problem List   Diagnosis Date Noted   Basal cell carcinoma of left side of nose 08/14/2021   Chronic pancreatitis ([place]) 02/05/2021   Pulmonary embolus ([place]) 02/05/2021   Protein-calorie malnutrition, severe 12/06/2020   Dysphagia    Slow transit constipation     Hemodialysis-associated hypotension    Dizziness and giddiness    Acute on chronic anemia    ESRD on dialysis [facility])    Palliative care by specialist    Debility    Thrombocytopenia ([place])    Chronic systolic congestive heart failure (HCC)    Acute respiratory failure (HCC)    AKI (acute kidney injury) ([place])    Dehydration    Persistent atrial fibrillation (HCC)    Severe sepsis ([place]) 10/14/2020   Pneumonia due to COVID-19 virus 72/06/4708   Chronic systolic CHF (congestive heart failure) ([place]) 10/14/2020   Elevated troponin 10/14/2020   Facial weakness 10/14/2020   Hypocalcemia 10/14/2020   Macrocytic anemia 62/83/6629   Acute metabolic encephalopathy 47/65/4650   PAF (paroxysmal atrial fibrillation) ([place]) 07/05/2020   Anticoagulated 07/05/2020   NICM (nonischemic cardiomyopathy) ([place]) 07/05/2020   History of melanoma 07/05/2020    REFERRING DIAG: R26.9 (ICD-10-CM) - Unspecified abnormalities of gait and mobility  THERAPY DIAG:  Unsteadiness on feet  Dehydration  Stiffness of right hip, not elsewhere classified  Stiffness of left hip, not elsewhere classified  Muscle weakness (generalized)  Debility  PERTINENT HISTORY: Debility, long Hospitalization with sepsis and decubitus ulcer,   thrombocytopenia Atrial fibrilation. Melanoma,  chronic pancreatitis, ESRD (3 x a week down to 1 x a week) extensive medical hx see medical chart.  SUBJECTIVE: Pt complains of Left low back pain and continued tingling over R ant lateral thigh since being in hospital  PAIN:  Are you having pain? Yes NPRS scale: 4//10 Pain location: back R hip Pain orientation: Bilateral  PAIN TYPE: aching Pain description: intermittent  Aggravating factors: rotation of hips, bending flexing back, walking /standing for longer than 15 min Relieving factors: Keeping back stiff, avoiding rotation     OBJECTIVE:   *Unless otherwise noted, objective measures collected previously*  DIAGNOSTIC  FINDINGS: 11/12/17 IMPRESSION: 1. No acute osseous abnormality or metastatic disease identified in the lumbar spine. 2. Posterior dermis associated 14 mm cyst at the L1 level appears benign, such as sebaceous cyst. 3. Transitional lumbosacral anatomy. Same numbering system as on the 2007 MRI designating a mostly sacralized L5 level. 4. Progressed lumbar spine degeneration since 2007. New or increased: - moderate spinal stenosis at L2-L3 and L3-L4. - moderate to severe left L4 neural foraminal stenosis. - moderate left L3 neural foraminal stenosis. -mild spinal stenosis at T12-L1 and L1-L2.   PATIENT SURVEYS:  FOTO 55% predicted 60%   SCREENING FOR RED FLAGS: Bowel or bladder incontinence: No Cauda equina syndrome: No Compression fracture: No     COGNITION:          Overall cognitive status: Within functional limits for tasks assessed                       Pt is HOH SENSATION:          Light touch: Deficits numb in R ant thigha          Stereognosis: Appears intact          Hot/Cold: Appears intact          Proprioception: Appears intact   MUSCLE LENGTH: Hamstrings: Right 50 deg; Left 50 deg Thomas test: bil tightness moderate   POSTURE:  Flexed trunk posture, PPT forward head and rounded shoulders   PALPATION: TTP over bil low back lumbar and TTP over R>L   LUMBARAROM/PROM   A/PROM A/PROM  12/06/2021  Flexion Finger tip to anterior thigh bil  Extension 0 increase with pain  Right lateral flexion 2 in above knee jt line  Left lateral flexion 2 inch above knee jt line  Right rotation 50%  Left rotation 50%   (Blank rows = not tested)   LE AROM/PROM:   A/PROM Right 12/06/2021 Left 12/06/2021  Hip flexion 70 76  Hip extension      Hip abduction      Hip adduction      Hip internal rotation 18 23  Hip external rotation 30 45  Knee flexion 130 127  Knee extension -5 -8  Ankle dorsiflexion 10 9  Ankle plantarflexion      Ankle inversion      Ankle eversion        (Blank rows = not tested)   LE MMT:   MMT Right 12/06/2021 Left 12/06/2021  Hip flexion 3- 3+  Hip extension 3- 3-  Hip abduction 3- 3-  Hip adduction      Hip internal rotation      Hip external rotation      Knee flexion 4- 4-  Knee extension 4- 4-  Ankle dorsiflexion      Ankle plantarflexion      Ankle inversion      Ankle eversion       (Blank rows = not tested)   LUMBAR SPECIAL TESTS:  Straight leg raise test: Negative, Slump test: Negative, and FABER test: Positive   FUNCTIONAL TESTS:  5 times sit to stand: 19.4 sec 6 minute walk test: TBA Berg Balance Scale: 46/56   GAIT: Distance walked: 150 feet Assistive device utilized: None Level of assistance: Complete Independence Gait Velocity   1.99 ft /sec Comments: Pt gait with decreased hip flexion   BERG BALANCE   Sitting to Standing: Numbers; 0-4: 4      4. Stands without using hands and stabilize independently      3. Stands independently using hands      2. Stands using hands after multiple trials      1. Min A to stand      0. Mod-Max A to stand Standing unsupported: Numbers; 0-4: 4      4. Stands safely for 2 minutes      3. Stands 2 minutes with supervision      2. Stands 30 seconds unsupported      1. Needs several tries to stand unsupported for 30 seconds      0. Unable to stand unsupported for 30 seconds Sitting unsupported: Numbers; 0-4: 4      4. Sits for 2 minutes independently      3. Sits for 2 minutes with supervision      2. Able to sit 30 seconds      1. Able to sit 10 seconds      0. Unable to sit for 10 seconds Standing to Sitting: Numbers; 0-4: 4 4. Sits safely with minimal use of hands 3. Controls descent with hands 2. Uses back of legs against chair to control descent 1. Sits independently, but uncontrolled descent 0. Needs assistance Transfers: Numbers; 0-4: 4      4. Transfers safely with minor use of hands      3. Transfers safely definite use of hands      2. Transfers  with verbal cueing/supervision      1. Needs 1 person assist      0. Needs 2 person assist  Standing with eyes closed: Numbers; 0-4: 4      4. Stands safely for 10 seconds      3. Stands 10 seconds with supervision       2. Able to stand for 3 seconds      1. Unable to keep eyes closed for 3 seconds, but is safe      0. Needs assist to keep from falling Standing with feet together: Numbers; 0-4: 3 4. Stands for 1 minute safely 3. Stands for 1 minute with supervision 2. Unable to hold for 30 seconds      1. Needs help to attain position but can hold for 15 seconds      0. Needs help to attain position and unable to hold for 15 seconds Reaching forward with outstretched arm: Numbers; 0-4: 3      4. Reaches forward 10 inches      3. Reaches forward 5 inches      2. Reaches forward 2 inches      1. Reaches forward with supervision      0. Loses balance/requires assistace Retrieving object from the floor: Numbers; 0-4: 4 4. Able to pick up easily and safely 3. Able to pick up with supervision 2. Unable to pick up, but reaches within 1-2 inches independently 1. Unable to pick up and needs supervision 0. Unable/needs assistance to keep from falling  Turning to look behind:  Numbers; 0-4: 4      4. Looks behind from both sides and weight shifts well      3. Looks behind one side only, other side less weight shift      2. Turns sideways only, maintains balance      1. Needs supervision when turning      0. Needs assistance  Turning 360 degrees: Numbers; 0-4: 4      4. Able to turn in </=4 seconds      3. Able to turn on one side in </= 4 seconds       2. Able to turn slowly, but safely      1. Needs supervision or verbal cueing      0. Needs assistance Place alternate foot on stool: Numbers; 0-4: 2 4. Completes 8 steps in 20 seconds 3. Completes 8 steps in >20 seconds 2. 4 steps without assistance/supervision 1. Completes >2 steps with minimal assist 0. Unable, needs assist to keep  from falling Standing with one foot in front: Numbers; 0-4: 1      4. Independent tandem for 30 seconds      3. Independent foot ahead for 30 seconds      2. Independent small step for 30 seconds      1. Needs help to step, but can hold for 15 seconds      0. Loses balance while standing/stepping Standing on one foot: Numbers; 0-4: 1 4. Holds >10 seconds 3. Holds 5-10 seconds 2. Holds >/=3 seconds  1. Holds <3 seconds 0. Unable    Total Score: 46/56    12/13/2021: 6MWT: 613f; 1.665fsec  TODAY'S TREATMENT   [facility] Adult PT Treatment:                                                DATE: 12-20-21  6MWT test 918 ft (1431 normall for 8944o) Therapeutic Exercise: Step ups with 8 inch step 2 x 10 on R and then L Side step ups 8 inch step 2 x 10 on R and then L OH press with R and then L UE R 7, and L 9 then R 8 and then L 14 Leg press 40 lb bil 3 x 10 Biceps 5 # weight 2 x 10 bil Sit to stand with 20 # DB 2 x 10 Heel raises 20 x  Manual Therapy: Pt with PT providing overstretch for left piriformis stretch and trunk rotation STW R QL and lateral and anterior thigh for desentization   [facility] Adult PT Treatment:                                                DATE: 12-18-21 Therapeutic Exercise: Standing at counter with UE support hip extension RTB 2 x 10 Standing at counter with UE support hip flexion RTB 2 x 10 Standing at counter with UE support hip flexion RTB 2 x 10 Heel raises on edge of Airex pad with slow descent 3x15 Sit to stand with 15 # KB 2 x 8 Childs pose stretch in quadriped, and forward and to both side within pt tolerance  Neuromuscular re-ed:  Retro walking with PT resistance and random removal of resistance down 4 lengths  of counter in clinic Standing wall clock balance with toe taps,  SLS on There ex pad with UE support on counter Tandem walking with random lateral perturbations from PT x4 lengths of counter in clinic  while holding onto counter  [facility] Adult PT  Treatment:                                                DATE: 12/13/2021 Therapeutic Exercise: Sumo squat side steps x4 laps in // bars Heel raises on edge of Airex pad with slow descent 3x15 Manual Therapy: N/A Neuromuscular re-ed: Retro walking with PT resistance and random removal of resistance x3 laps in // bars Tandem walking with random lateral perturbations from PT x4 laps in // bars Forward step-up with knee drive and slow descent 3x10 BIL Therapeutic Activity: 6MWT HEP demonstration and issuing Modalities: N/A Self Care: N/A      PATIENT EDUCATION:  Education details: HEP Person educated: Patient  Education method: Consulting civil engineer, demonstration, handout Education comprehension: verbalized understanding and needs further education     HOME EXERCISE PROGRAM: Access Code: [email or web address] URL: [email or web address] Date: 12/13/2021 Prepared by: Vanessa   Exercises Single Leg Balance Clock WITH UPPER EXTREMITY SUPPORT - 1 x daily - 7 x weekly - 3 sets - 5 reps Mini Squat with Counter Support - 1 x daily - 7 x weekly - 3 sets - 10 reps Sidelying Hip Abduction - 1 x daily - 7 x weekly - 2 sets - 10 reps - 3-sec hold Supine Bridge - 1 x daily - 7 x weekly - 3 sets - 10 reps - 3-sec hold Access Code: [email or web address] Added 12-18-21  Standing Hip Extension with Resistance at Ankles and Counter Support - 1 x daily - 7 x weekly - 3 sets - 10 reps Standing Hip Flexion with Resistance at Ankles and Counter Support - 1 x daily - 7 x weekly - 3 sets - 10 reps Standing Hip Abduction with Resistance at Ankles and Counter Support - 1 x daily - 7 x weekly - 3 sets - 10 reps    ASSESSMENT:   CLINICAL IMPRESSION: Mr Covin enters clinic without AD.  He states he was very sore but he is feeling stronger.  4/10 low back pain R. Pt is able to perform sit to stand 20 # .  6 MWT 918 ft (norm for age 75 )  He will benefit from continued LE strengthening in addition to progressive  balance training/ falls prevention.  Mr Reason is walking without AD and appears more stable than on eval.The pt will benefit from continued skilled PT to address his primary impairments and return to his prior level of function with less limitation.     OBJECTIVE IMPAIRMENTS Abnormal gait, decreased activity tolerance, decreased balance, decreased mobility, decreased ROM, decreased strength, increased fascial restrictions, improper body mechanics, postural dysfunction, and pain.    ACTIVITY LIMITATIONS  exercise and walking  .    PERSONAL FACTORS Debility, long Hospitalization with sepsis and decubitus ulcer,   thrombocytopenia Atrial fibrilation. Melanoma,  chronic pancreatitis, ESRD (3 x a week down to 1 x a week) extensive medical hx see medical chart. are also affecting patient's functional outcome.      REHAB POTENTIAL: Good   CLINICAL DECISION MAKING: Evolving/moderate complexity   EVALUATION COMPLEXITY: Moderate     GOALS: Goals reviewed with patient?  No   SHORT TERM GOALS:   STG Name Target Date Goal status  1 Pt will be independent  with initial HEP Baseline: no knowledge 01/03/2022 INITIAL  2 Pt will perform 6 MWT to establish LTG Baseline: TBA 01/03/2022 INITIAL  3 Pt will be able to walk for 1/2 mile on treadmill to show increase endurance Baseline:unable to walk for longer than 10 min 01/03/2022 INITIAL  4 Pt will be able to perform 5 X STS in 13.0 sec or less to show decrease risk of fall Baseline:19.4 sec eval 01/03/2022 INITIAL    LONG TERM GOALS:    LTG Name Target Date Goal status  1 Pt will be independent with advanced HEP Baseline:no knowledge 01/31/2022 INITIAL  2 Mr Crescenzo with increase Gait velocity to 2.62 ft/sec in order to safely ambulate at community activity level. Baseline: Eval 1.99 ft/sec 01/31/2022 INITIAL  3 Pt will be able to squat and carry 25 # in order to easily squat and pick up groceries Baseline: Pt avoids squatting due to decreased rotation  in hips 01/31/2022 INITIAL  4 Pt will incorporate walking for exercise at least 4 x a week for a minimum of 1 mile Baseline: Pt is sedentary and sits most of day 01/31/2022 INITIAL  5 PT with be able to walk/stand >/= 1 hour with no AD with </= 2/10 pain for functional endurance and return to leisure activities post DC Baseline: Pt stands for no more than 10-15 min before fatiguing 01/31/2022 INITIAL  6 BERG balance will improve at least 5 points to help decrease risk of fall Baseline:eval 46 01/31/2022 INITIAL  7 Pt will demo floor to stand transfer to show increased LE strength and increase fall preparedness Baseline: unable to lower to floor safely 01/31/2022 INITIAL    PLAN: PT FREQUENCY: 2x/week   PT DURATION: 8 weeks   PLANNED INTERVENTIONS: Therapeutic exercises, Therapeutic activity, Neuro Muscular re-education, Balance training, Gait training, Patient/Family education, Joint mobilization, Stair training, Dry Needling, Electrical stimulation, Cryotherapy, Moist heat, Taping, Ionotophoresis '4mg'$ /ml Dexamethasone, and Manual therapy   PLAN FOR NEXT SESSION: continue balance/ LE progression check Sit to stand and reactionary balance So 6 MWT    Voncille Lo, PT, [facility] Certified Exercise Expert for the Aging Adult  12/20/21 5:13 PM Phone: [phone] Fax: [phone]

## 2021-12-20 ENCOUNTER — Ambulatory Visit: Payer: Medicare Other | Admitting: Physical Therapy

## 2021-12-20 ENCOUNTER — Other Ambulatory Visit: Payer: Self-pay

## 2021-12-20 DIAGNOSIS — R5381 Other malaise: Secondary | ICD-10-CM

## 2021-12-20 DIAGNOSIS — M6281 Muscle weakness (generalized): Secondary | ICD-10-CM

## 2021-12-20 DIAGNOSIS — M25651 Stiffness of right hip, not elsewhere classified: Secondary | ICD-10-CM | POA: Diagnosis not present

## 2021-12-20 DIAGNOSIS — R2681 Unsteadiness on feet: Secondary | ICD-10-CM

## 2021-12-20 DIAGNOSIS — E860 Dehydration: Secondary | ICD-10-CM | POA: Diagnosis not present

## 2021-12-20 DIAGNOSIS — M25652 Stiffness of left hip, not elsewhere classified: Secondary | ICD-10-CM

## 2021-12-20 NOTE — Therapy (Incomplete)
OUTPATIENT PHYSICAL THERAPY TREATMENT NOTE   Patient Name: Raymond Hurley. MRN: [id number] DOB:07-12-1931, 88 y.o., male Today's Date: 12/20/2021  PCP: Lajean Manes, MD REFERRING PROVIDER: Lajean Manes, MD       Past Medical History:  Diagnosis Date   Acute pancreatitis    Atrial fibrillation ([place])    CAD (coronary artery disease) 04/2019   CHF (congestive heart failure) (HCC)    Chronic kidney disease    Melanoma ([place])    Vitamin B 12 deficiency    Past Surgical History:  Procedure Laterality Date   CARDIOVERSION N/A 06/02/2019   Procedure: CARDIOVERSION;  Surgeon: Jerline Pain, MD;  Location: [facility];  Service: Cardiovascular;  Laterality: N/A;   IR FLUORO GUIDE CV LINE RIGHT  11/06/2020   IR US GUIDE VASC ACCESS RIGHT  11/06/2020   RIGHT/LEFT HEART CATH AND CORONARY ANGIOGRAPHY N/A 04/27/2019   Procedure: RIGHT/LEFT HEART CATH AND CORONARY ANGIOGRAPHY;  Surgeon: Jolaine Artist, MD;  Location: [facility];  Service: Cardiovascular;  Laterality: N/A;   TEE WITHOUT CARDIOVERSION N/A 06/02/2019   Procedure: TRANSESOPHAGEAL ECHOCARDIOGRAM (TEE);  Surgeon: Jerline Pain, MD;  Location: [facility] ENDOSCOPY;  Service: Cardiovascular;  Laterality: N/A;   Patient Active Problem List   Diagnosis Date Noted   Basal cell carcinoma of left side of nose 08/14/2021   Chronic pancreatitis ([place]) 02/05/2021   Pulmonary embolus ([place]) 02/05/2021   Protein-calorie malnutrition, severe 12/06/2020   Dysphagia    Slow transit constipation    Hemodialysis-associated hypotension    Dizziness and giddiness    Acute on chronic anemia    ESRD on dialysis [facility])    Palliative care by specialist    Debility    Thrombocytopenia ([place])    Chronic systolic congestive heart failure (HCC)    Acute respiratory failure (HCC)    AKI (acute kidney injury) ([place])    Dehydration    Persistent atrial fibrillation (HCC)    Severe sepsis ([place]) 10/14/2020   Pneumonia due to COVID-19 virus  47/42/5956   Chronic systolic CHF (congestive heart failure) ([place]) 10/14/2020   Elevated troponin 10/14/2020   Facial weakness 10/14/2020   Hypocalcemia 10/14/2020   Macrocytic anemia 38/75/6433   Acute metabolic encephalopathy 29/51/8841   PAF (paroxysmal atrial fibrillation) ([place]) 07/05/2020   Anticoagulated 07/05/2020   NICM (nonischemic cardiomyopathy) ([place]) 07/05/2020   History of melanoma 07/05/2020    REFERRING DIAG: R26.9 (ICD-10-CM) - Unspecified abnormalities of gait and mobility  THERAPY DIAG:  No diagnosis found.  PERTINENT HISTORY: Debility, long Hospitalization with sepsis and decubitus ulcer,   thrombocytopenia Atrial fibrilation. Melanoma,  chronic pancreatitis, ESRD (3 x a week down to 1 x a week) extensive medical hx see medical chart.   SUBJECTIVE: Pt complains of Left low back pain and continued tingling over R ant lateral thigh since being in hospital  PAIN:  Are you having pain? Yes NPRS scale: 4//10 Pain location: back R hip Pain orientation: Bilateral  PAIN TYPE: aching Pain description: intermittent  Aggravating factors: rotation of hips, bending flexing back, walking /standing for longer than 15 min Relieving factors: Keeping back stiff, avoiding rotation     OBJECTIVE:   *Unless otherwise noted, objective measures collected previously*  DIAGNOSTIC FINDINGS: 11/12/17 IMPRESSION: 1. No acute osseous abnormality or metastatic disease identified in the lumbar spine. 2. Posterior dermis associated 14 mm cyst at the L1 level appears benign, such as sebaceous cyst. 3. Transitional lumbosacral anatomy. Same numbering system as on the 2007 MRI designating  a mostly sacralized L5 level. 4. Progressed lumbar spine degeneration since 2007. New or increased: - moderate spinal stenosis at L2-L3 and L3-L4. - moderate to severe left L4 neural foraminal stenosis. - moderate left L3 neural foraminal stenosis. -mild spinal stenosis at T12-L1 and L1-L2.    PATIENT SURVEYS:  FOTO 55% predicted 60%   SCREENING FOR RED FLAGS: Bowel or bladder incontinence: No Cauda equina syndrome: No Compression fracture: No     COGNITION:          Overall cognitive status: Within functional limits for tasks assessed                       Pt is HOH SENSATION:          Light touch: Deficits numb in R ant thigha          Stereognosis: Appears intact          Hot/Cold: Appears intact          Proprioception: Appears intact   MUSCLE LENGTH: Hamstrings: Right 50 deg; Left 50 deg Thomas test: bil tightness moderate   POSTURE:  Flexed trunk posture, PPT forward head and rounded shoulders   PALPATION: TTP over bil low back lumbar and TTP over R>L   LUMBARAROM/PROM   A/PROM A/PROM  12/06/2021  Flexion Finger tip to anterior thigh bil  Extension 0 increase with pain  Right lateral flexion 2 in above knee jt line  Left lateral flexion 2 inch above knee jt line  Right rotation 50%  Left rotation 50%   (Blank rows = not tested)   LE AROM/PROM:   A/PROM Right 12/06/2021 Left 12/06/2021  Hip flexion 70 76  Hip extension      Hip abduction      Hip adduction      Hip internal rotation 18 23  Hip external rotation 30 45  Knee flexion 130 127  Knee extension -5 -8  Ankle dorsiflexion 10 9  Ankle plantarflexion      Ankle inversion      Ankle eversion       (Blank rows = not tested)   LE MMT:   MMT Right 12/06/2021 Left 12/06/2021  Hip flexion 3- 3+  Hip extension 3- 3-  Hip abduction 3- 3-  Hip adduction      Hip internal rotation      Hip external rotation      Knee flexion 4- 4-  Knee extension 4- 4-  Ankle dorsiflexion      Ankle plantarflexion      Ankle inversion      Ankle eversion       (Blank rows = not tested)   LUMBAR SPECIAL TESTS:  Straight leg raise test: Negative, Slump test: Negative, and FABER test: Positive   FUNCTIONAL TESTS:  5 times sit to stand: 19.4 sec 6 minute walk test: TBA Berg Balance Scale:  46/56   GAIT: Distance walked: 150 feet Assistive device utilized: None Level of assistance: Complete Independence Gait Velocity   1.99 ft /sec Comments: Pt gait with decreased hip flexion   BERG BALANCE   Sitting to Standing: Numbers; 0-4: 4      4. Stands without using hands and stabilize independently      3. Stands independently using hands      2. Stands using hands after multiple trials      1. Min A to stand      0. Mod-Max A to stand Standing  unsupported: Numbers; 0-4: 4      4. Stands safely for 2 minutes      3. Stands 2 minutes with supervision      2. Stands 30 seconds unsupported      1. Needs several tries to stand unsupported for 30 seconds      0. Unable to stand unsupported for 30 seconds Sitting unsupported: Numbers; 0-4: 4      4. Sits for 2 minutes independently      3. Sits for 2 minutes with supervision      2. Able to sit 30 seconds      1. Able to sit 10 seconds      0. Unable to sit for 10 seconds Standing to Sitting: Numbers; 0-4: 4 4. Sits safely with minimal use of hands 3. Controls descent with hands 2. Uses back of legs against chair to control descent 1. Sits independently, but uncontrolled descent 0. Needs assistance Transfers: Numbers; 0-4: 4      4. Transfers safely with minor use of hands      3. Transfers safely definite use of hands      2. Transfers with verbal cueing/supervision      1. Needs 1 person assist      0. Needs 2 person assist  Standing with eyes closed: Numbers; 0-4: 4      4. Stands safely for 10 seconds      3. Stands 10 seconds with supervision       2. Able to stand for 3 seconds      1. Unable to keep eyes closed for 3 seconds, but is safe      0. Needs assist to keep from falling Standing with feet together: Numbers; 0-4: 3 4. Stands for 1 minute safely 3. Stands for 1 minute with supervision 2. Unable to hold for 30 seconds      1. Needs help to attain position but can hold for 15 seconds      0. Needs help  to attain position and unable to hold for 15 seconds Reaching forward with outstretched arm: Numbers; 0-4: 3      4. Reaches forward 10 inches      3. Reaches forward 5 inches      2. Reaches forward 2 inches      1. Reaches forward with supervision      0. Loses balance/requires assistace Retrieving object from the floor: Numbers; 0-4: 4 4. Able to pick up easily and safely 3. Able to pick up with supervision 2. Unable to pick up, but reaches within 1-2 inches independently 1. Unable to pick up and needs supervision 0. Unable/needs assistance to keep from falling  Turning to look behind: Numbers; 0-4: 4      4. Looks behind from both sides and weight shifts well      3. Looks behind one side only, other side less weight shift      2. Turns sideways only, maintains balance      1. Needs supervision when turning      0. Needs assistance  Turning 360 degrees: Numbers; 0-4: 4      4. Able to turn in </=4 seconds      3. Able to turn on one side in </= 4 seconds       2. Able to turn slowly, but safely      1. Needs supervision or verbal cueing      0. Needs assistance Place  alternate foot on stool: Numbers; 0-4: 2 4. Completes 8 steps in 20 seconds 3. Completes 8 steps in >20 seconds 2. 4 steps without assistance/supervision 1. Completes >2 steps with minimal assist 0. Unable, needs assist to keep from falling Standing with one foot in front: Numbers; 0-4: 1      4. Independent tandem for 30 seconds      3. Independent foot ahead for 30 seconds      2. Independent small step for 30 seconds      1. Needs help to step, but can hold for 15 seconds      0. Loses balance while standing/stepping Standing on one foot: Numbers; 0-4: 1 4. Holds >10 seconds 3. Holds 5-10 seconds 2. Holds >/=3 seconds  1. Holds <3 seconds 0. Unable    Total Score: 46/56    12/13/2021: 6MWT: 667f; 1.639fsec  TODAY'S TREATMENT  [facility] Adult PT Treatment:                                                 DATE: 12-25-21  Check FOTO   Therapeutic Exercise: *** Manual Therapy: TPDN Neuromuscular re-ed: *** Therapeutic Activity: *** Modalities: *** Self Care: ***   [facility]:                                                DATE: 12-20-21  6MWT test 918 ft (1431 normall for 8956o) Therapeutic Exercise: Step ups with 8 inch step 2 x 10 on R and then L Side step ups 8 inch step 2 x 10 on R and then L OH press with R and then L UE R 7, and L 9 then R 8 and then L 14 Leg press 40 lb bil 3 x 10 Biceps 5 # weight 2 x 10 bil Sit to stand with 20 # DB 2 x 10 Heel raises 20 x  Manual Therapy: Pt with PT providing overstretch for left piriformis stretch and trunk rotation STW R QL and lateral and anterior thigh for desentization   [facility] Adult PT Treatment:                                                DATE: 12-18-21 Therapeutic Exercise: Standing at counter with UE support hip extension RTB 2 x 10 Standing at counter with UE support hip flexion RTB 2 x 10 Standing at counter with UE support hip flexion RTB 2 x 10 Heel raises on edge of Airex pad with slow descent 3x15 Sit to stand with 15 # KB 2 x 8 Childs pose stretch in quadriped, and forward and to both side within pt tolerance  Neuromuscular re-ed:  Retro walking with PT resistance and random removal of resistance down 4 lengths of counter in clinic Standing wall clock balance with toe taps,  SLS on There ex pad with UE support on counter Tandem walking with random lateral perturbations from PT x4 lengths of counter in clinic  while holding onto counter  [facility]  [facility]: [facility]20[facility] Therapeutic Exercise: Sumo squat side steps x4 laps in // bars Heel raises on edge of Airex pad with slow descent 3x15 Manual Therapy: N/A Neuromuscular re-ed: Retro walking with PT resistance and random removal of resistance x3 laps in // bars Tandem walking with random  lateral perturbations from PT x4 laps in // bars Forward step-up with knee drive and slow descent 3x10 BIL Therapeutic Activity: 6MWT HEP demonstration and issuing Modalities: N/A Self Care: N/A      PATIENT EDUCATION:  Education details: HEP Person educated: Patient  Education method: Consulting civil engineer, demonstration, handout Education comprehension: verbalized understanding and needs further education     HOME EXERCISE PROGRAM: Access Code: [email or web address] URL: [email or web address] Date: 12/13/2021 Prepared by: Vanessa   Exercises Single Leg Balance Clock WITH UPPER EXTREMITY SUPPORT - 1 x daily - 7 x weekly - 3 sets - 5 reps Mini Squat with Counter Support - 1 x daily - 7 x weekly - 3 sets - 10 reps Sidelying Hip Abduction - 1 x daily - 7 x weekly - 2 sets - 10 reps - 3-sec hold Supine Bridge - 1 x daily - 7 x weekly - 3 sets - 10 reps - 3-sec hold Access Code: [email or web address] Added 12-18-21  Standing Hip Extension with Resistance at Ankles and Counter Support - 1 x daily - 7 x weekly - 3 sets - 10 reps Standing Hip Flexion with Resistance at Ankles and Counter Support - 1 x daily - 7 x weekly - 3 sets - 10 reps Standing Hip Abduction with Resistance at Ankles and Counter Support - 1 x daily - 7 x weekly - 3 sets - 10 reps    ASSESSMENT:   CLINICAL IMPRESSION: Mr Cansler enters clinic without AD.  He states he was very sore but he is feeling stronger.  4/10 low back pain R. Pt is able to perform sit to stand 20 # .  6 MWT 918 ft (norm for age 86 )  He will benefit from continued LE strengthening in addition to progressive balance training/ falls prevention.  Mr Jakubiak is walking without AD and appears more stable than on eval.The pt will benefit from continued skilled PT to address his primary impairments and return to his prior level of function with less limitation.     OBJECTIVE IMPAIRMENTS Abnormal gait, decreased activity tolerance, decreased balance, decreased  mobility, decreased ROM, decreased strength, increased fascial restrictions, improper body mechanics, postural dysfunction, and pain.    ACTIVITY LIMITATIONS  exercise and walking  .    PERSONAL FACTORS Debility, long Hospitalization with sepsis and decubitus ulcer,   thrombocytopenia Atrial fibrilation. Melanoma,  chronic pancreatitis, ESRD (3 x a week down to 1 x a week) extensive medical hx see medical chart. are also affecting patient's functional outcome.      REHAB POTENTIAL: Good   CLINICAL DECISION MAKING: Evolving/moderate complexity   EVALUATION COMPLEXITY: Moderate     GOALS: Goals reviewed with patient? No   SHORT TERM GOALS:   STG Name Target Date Goal status  1 Pt will be independent  with initial HEP Baseline: no knowledge 01/03/2022 INITIAL  2 Pt will perform 6 MWT to establish LTG Baseline: TBA 01/03/2022 INITIAL  3 Pt will be able to walk for 1/2 mile on treadmill to show increase endurance Baseline:unable to walk for longer than 10 min 01/03/2022 INITIAL  4 Pt will be able to perform 5 X STS in 13.0 sec or less to show decrease risk of fall Baseline:19.4  sec eval 01/03/2022 INITIAL    LONG TERM GOALS:    LTG Name Target Date Goal status  1 Pt will be independent with advanced HEP Baseline:no knowledge 01/31/2022 INITIAL  2 Mr Staniszewski with increase Gait velocity to 2.62 ft/sec in order to safely ambulate at community activity level. Baseline: Eval 1.99 ft/sec 01/31/2022 INITIAL  3 Pt will be able to squat and carry 25 # in order to easily squat and pick up groceries Baseline: Pt avoids squatting due to decreased rotation in hips 01/31/2022 INITIAL  4 Pt will incorporate walking for exercise at least 4 x a week for a minimum of 1 mile Baseline: Pt is sedentary and sits most of day 01/31/2022 INITIAL  5 PT with be able to walk/stand >/= 1 hour with no AD with </= 2/10 pain for functional endurance and return to leisure activities post DC Baseline: Pt stands for no more  than 10-15 min before fatiguing 01/31/2022 INITIAL  6 BERG balance will improve at least 5 points to help decrease risk of fall Baseline:eval 46 01/31/2022 INITIAL  7 Pt will demo floor to stand transfer to show increased LE strength and increase fall preparedness Baseline: unable to lower to floor safely 01/31/2022 INITIAL    PLAN: PT FREQUENCY: 2x/week   PT DURATION: 8 weeks   PLANNED INTERVENTIONS: Therapeutic exercises, Therapeutic activity, Neuro Muscular re-education, Balance training, Gait training, Patient/Family education, Joint mobilization, Stair training, Dry Needling, Electrical stimulation, Cryotherapy, Moist heat, Taping, Ionotophoresis '4mg'$ /ml Dexamethasone, and Manual therapy   PLAN FOR NEXT SESSION: continue balance/ LE progression check Sit to stand and reactionary balance So 6 MWT  Dry needling for R hip/low back   ***

## 2021-12-24 ENCOUNTER — Other Ambulatory Visit: Payer: Self-pay

## 2021-12-24 DIAGNOSIS — I480 Paroxysmal atrial fibrillation: Secondary | ICD-10-CM | POA: Diagnosis not present

## 2021-12-24 DIAGNOSIS — R5383 Other fatigue: Secondary | ICD-10-CM | POA: Diagnosis not present

## 2021-12-25 ENCOUNTER — Ambulatory Visit: Payer: Medicare Other | Admitting: Physical Therapy

## 2021-12-25 ENCOUNTER — Telehealth: Payer: Self-pay | Admitting: *Deleted

## 2021-12-25 ENCOUNTER — Other Ambulatory Visit: Payer: Self-pay

## 2021-12-25 DIAGNOSIS — R2681 Unsteadiness on feet: Secondary | ICD-10-CM | POA: Diagnosis not present

## 2021-12-25 DIAGNOSIS — M25652 Stiffness of left hip, not elsewhere classified: Secondary | ICD-10-CM | POA: Diagnosis not present

## 2021-12-25 DIAGNOSIS — M25651 Stiffness of right hip, not elsewhere classified: Secondary | ICD-10-CM | POA: Diagnosis not present

## 2021-12-25 DIAGNOSIS — E860 Dehydration: Secondary | ICD-10-CM

## 2021-12-25 DIAGNOSIS — M6281 Muscle weakness (generalized): Secondary | ICD-10-CM | POA: Diagnosis not present

## 2021-12-25 DIAGNOSIS — R5381 Other malaise: Secondary | ICD-10-CM | POA: Diagnosis not present

## 2021-12-25 DIAGNOSIS — R7989 Other specified abnormal findings of blood chemistry: Secondary | ICD-10-CM

## 2021-12-25 LAB — TSH: TSH: 73.1 u[IU]/mL — ABNORMAL HIGH (ref 0.450–4.500)

## 2021-12-25 MED ORDER — LEVOTHYROXINE SODIUM 88 MCG PO TABS: 88.0000 ug | ORAL_TABLET | Freq: Every day | ORAL | Status: DC

## 2021-12-25 NOTE — Telephone Encounter (Signed)
-----   Message from Lelon Perla, MD sent at 12/25/2021  7:18 AM EDT ----- ?TSH remains elevated.  Increase Synthroid to 100 mcg daily.  In 12 weeks check TSH and free T4. ?Kirk Ruths ? ?

## 2021-12-25 NOTE — Patient Instructions (Signed)
Sleeping on Back ? ?Place pillow under knees. A pillow with cervical support and a roll around waist are also helpful. ?Copyright ? VHI. All rights reserved.  Sleeping on Side ?Place pillow between knees. Use cervical support under neck and a roll around waist as needed. ?Copyright ? VHI. All rights reserved.   ?Sleeping on Stomach ? ? ?If this is the only desirable sleeping position, place pillow under lower legs, and under stomach or chest as needed. ? ?Posture - Sitting ? ? ?Sit upright, head facing forward. Try using a roll to support lower back. Keep shoulders relaxed, and avoid rounded back. Keep hips level with knees. Avoid crossing legs for long periods. ?Stand to Sit / Sit to Stand ? ? ?To sit: Bend knees to lower self onto front edge of chair, then scoot back on seat. ?To stand: Reverse sequence by placing one foot forward, and scoot to front of seat. Use rocking motion to stand up.  ? ?Work Height and Reach ? ?Ideal work height is no more than 2 to 4 inches below elbow level when standing, and at elbow level when sitting. Reaching should be limited to arm's length, with elbows slightly bent. ? ?Bending ? ?Bend at hips and knees, not back. Keep feet shoulder-width apart. ? ? ? Posture - Standing ? ? ?Good posture is important. Avoid slouching and forward head thrust. Maintain curve in low back and align ears over shoul- ders, hips over ankles. ? ?Alternating Positions ? ? ?Alternate tasks and change positions frequently to reduce fatigue and muscle tension. Take rest breaks. ?Computer Work ? ? ?Position work to face forward. Use proper work and seat height. Keep shoulders back and down, wrists straight, and elbows at right angles. Use chair that provides full back support. Add footrest and lumbar roll as needed. ? ?Getting Into / Out of Car ? ?Lower self onto seat, scoot back, then bring in one leg at a time. Reverse sequence to get out. ? ?Dressing ? ?Lie on back to pull socks or slacks over feet, or sit  and bend leg while keeping back straight. ?  ? ?[place] ? ?Place one foot on ledge of cabinet under sink when standing at sink for prolonged periods. ? ? ?Pushing / Pulling ? ?Pushing is preferable to pulling. Keep back in proper alignment, and use leg muscles to do the work. ? ?Deep Squat ? ? ?Squat and lift with both arms held against upper trunk. Tighten stomach muscles without holding breath. ?Use smooth movements to avoid jerking.  ?Avoid Twisting ? ? ?Avoid twisting or bending back. Pivot around using foot movements, and bend at knees if needed when reaching for articles. ? ?Carrying Luggage ? ? ?Distribute weight evenly on both sides. Use a cart whenever possible. Do not twist trunk. Move body as a unit. ? ? ?Lifting Principles ?Maintain proper posture and head alignment. ?Slide object as close as possible before lifting. ?Move obstacles out of the way. ?Test before lifting; ask for help if too heavy. ?Tighten stomach muscles without holding breath. ?Use smooth movements; do not jerk. ?Use legs to do the work, and pivot with feet. ?Distribute the work load symmetrically and close to the center of trunk. ?Push instead of pull whenever possible.  ? ?Ask For Help ? ? ?Ask for help and delegate to others when possible. Coordinate your movements when lifting together, and maintain the low back curve. ? ?Log Roll ? ? ?Lying on back, bend left knee and place left  arm across chest. Roll all in one movement to the right. Reverse to roll to the left. Always move as one unit. ?[place] ? ?Use long-handled equipment to avoid stooping. ? ? ?[place] ? ?Position yourself as close as possible to reach work surface. Avoid straining your back. ? ?Laundry - Unloading Wash ? ? ?To unload small items at bottom of washer, lift leg opposite to arm being used to reach. ? ?Gardening - Raking ? ?Move close to area to be raked. Use arm movements to do the work. Keep back straight and avoid twisting. ? ?    ?Cart ? ?When reaching into cart with one arm, lift opposite leg to keep back straight. ? ? Getting Into / Out of Bed ? ?Lower self to lie down on one side by raising legs and lowering head at the same time. Use arms to assist moving without twisting. Bend both knees to roll onto back if desired. To sit up, start from lying on side, and use same move-ments in reverse. ?[place] ? ?Hold the vacuum with arm held at side. Step back and forth to move it, keeping head up. Avoid twisting. ? ? ?Laundry - Loading Wash ? ?Position laundry basket so that bending and twisting can be avoided. ? ? Laundry - Unloading Dryer ? ?Squat down to reach into clothes dryer or use a reacher. ? ?[place] / [place] ? ?Squat or Kneel. Knee pads may be helpful. ? ? ? ? ? ? ? ? ? ? ? ? ? ? ? ?  ?  ?Voncille Lo, PT, [place] ?Certified Exercise Expert for the Aging Adult  ?12/25/21 3:59 PM ?Phone: [phone] ?Fax: [phone]  ?

## 2021-12-25 NOTE — Telephone Encounter (Signed)
Follow Up: ? ? ? ? ?Patient's wife is returning your call, ?

## 2021-12-25 NOTE — Telephone Encounter (Signed)
Left message for pt to call.

## 2021-12-26 DIAGNOSIS — N186 End stage renal disease: Secondary | ICD-10-CM | POA: Diagnosis not present

## 2021-12-26 DIAGNOSIS — Z992 Dependence on renal dialysis: Secondary | ICD-10-CM | POA: Diagnosis not present

## 2021-12-26 DIAGNOSIS — Z23 Encounter for immunization: Secondary | ICD-10-CM | POA: Diagnosis not present

## 2021-12-26 DIAGNOSIS — N2581 Secondary hyperparathyroidism of renal origin: Secondary | ICD-10-CM | POA: Diagnosis not present

## 2021-12-26 DIAGNOSIS — R739 Hyperglycemia, unspecified: Secondary | ICD-10-CM | POA: Diagnosis not present

## 2021-12-26 DIAGNOSIS — D509 Iron deficiency anemia, unspecified: Secondary | ICD-10-CM | POA: Diagnosis not present

## 2021-12-26 NOTE — Telephone Encounter (Signed)
Spoke with pt wife, she reports the dialysis center had checked his blood work and saw that the thyroid function was abnormal and they increased his levothyroxine to 88 mcg already. He will continue on that dose and recheck his lab work in 12 weeks. Medications list updated and Lab orders mailed to the pt. ?

## 2021-12-27 ENCOUNTER — Ambulatory Visit: Payer: Medicare Other | Admitting: Physical Therapy

## 2021-12-27 ENCOUNTER — Encounter: Payer: Self-pay | Admitting: Physical Therapy

## 2021-12-27 ENCOUNTER — Other Ambulatory Visit: Payer: Self-pay

## 2021-12-27 DIAGNOSIS — R2681 Unsteadiness on feet: Secondary | ICD-10-CM

## 2021-12-27 DIAGNOSIS — E860 Dehydration: Secondary | ICD-10-CM | POA: Diagnosis not present

## 2021-12-27 DIAGNOSIS — M25651 Stiffness of right hip, not elsewhere classified: Secondary | ICD-10-CM | POA: Diagnosis not present

## 2021-12-27 DIAGNOSIS — R5381 Other malaise: Secondary | ICD-10-CM

## 2021-12-27 DIAGNOSIS — M25652 Stiffness of left hip, not elsewhere classified: Secondary | ICD-10-CM | POA: Diagnosis not present

## 2021-12-27 DIAGNOSIS — M6281 Muscle weakness (generalized): Secondary | ICD-10-CM | POA: Diagnosis not present

## 2021-12-27 NOTE — Therapy (Deleted)
?OUTPATIENT PHYSICAL THERAPY TREATMENT NOTE ? ? ?Patient Name: Raymond Hurley. ?MRN: [id number] ?DOB:13-Apr-1931, 88 y.o., male ?Today's Date: 12/27/2021 ? ?PCP: Lajean Manes, MD ?REFERRING PROVIDER: Lajean Manes, MD ? ? ? ? ? ? ? ?Past Medical History:  ?Diagnosis Date  ? Acute pancreatitis   ? Atrial fibrillation ([place])   ? CAD (coronary artery disease) 04/2019  ? CHF (congestive heart failure) ([place])   ? Chronic kidney disease   ? Melanoma ([place])   ? Vitamin B 12 deficiency   ? ?Past Surgical History:  ?Procedure Laterality Date  ? CARDIOVERSION N/A 06/02/2019  ? Procedure: CARDIOVERSION;  Surgeon: Jerline Pain, MD;  Location: [facility] ENDOSCOPY;  Service: Cardiovascular;  Laterality: N/A;  ? IR FLUORO GUIDE CV LINE RIGHT  11/06/2020  ? IR US GUIDE VASC ACCESS RIGHT  11/06/2020  ? RIGHT/LEFT HEART CATH AND CORONARY ANGIOGRAPHY N/A 04/27/2019  ? Procedure: RIGHT/LEFT HEART CATH AND CORONARY ANGIOGRAPHY;  Surgeon: Jolaine Artist, MD;  Location: [place] CV LAB;  Service: Cardiovascular;  Laterality: N/A;  ? TEE WITHOUT CARDIOVERSION N/A 06/02/2019  ? Procedure: TRANSESOPHAGEAL ECHOCARDIOGRAM (TEE);  Surgeon: Jerline Pain, MD;  Location: [facility] ENDOSCOPY;  Service: Cardiovascular;  Laterality: N/A;  ? ?Patient Active Problem List  ? Diagnosis Date Noted  ? Basal cell carcinoma of left side of nose 08/14/2021  ? Chronic pancreatitis ([place]) 02/05/2021  ? Pulmonary embolus ([place]) 02/05/2021  ? Protein-calorie malnutrition, severe 12/06/2020  ? Dysphagia   ? Slow transit constipation   ? Hemodialysis-associated hypotension   ? Dizziness and giddiness   ? Acute on chronic anemia   ? ESRD on dialysis [facility])   ? Palliative care by specialist   ? Debility   ? Thrombocytopenia ([place])   ? Chronic systolic congestive heart failure ([place])   ? Acute respiratory failure ([place])   ? AKI (acute kidney injury) ([place])   ? Dehydration   ? Persistent atrial fibrillation ([place])   ? Severe sepsis ([place]) 10/14/2020  ? Pneumonia due to COVID-19  virus 10/14/2020  ? Chronic systolic CHF (congestive heart failure) ([place]) 10/14/2020  ? Elevated troponin 10/14/2020  ? Facial weakness 10/14/2020  ? Hypocalcemia 10/14/2020  ? Macrocytic anemia 10/14/2020  ? Acute metabolic encephalopathy 71/69/6789  ? PAF (paroxysmal atrial fibrillation) ([place]) 07/05/2020  ? Anticoagulated 07/05/2020  ? NICM (nonischemic cardiomyopathy) ([place]) 07/05/2020  ? History of melanoma 07/05/2020  ? ? ?REFERRING DIAG: R26.9 (ICD-10-CM) - Unspecified abnormalities of gait and mobility ? ?THERAPY DIAG:  ?No diagnosis found. ? ?PERTINENT HISTORY: Debility, long Hospitalization with sepsis and decubitus ulcer,   thrombocytopenia Atrial fibrilation. Melanoma,  chronic pancreatitis, ESRD (3 x a week down to 1 x a week) extensive medical hx see medical chart. ? ? ?SUBJECTIVE: We did a lot of lifting last session.  I'm a little sore.  My back gets irritated from lifting. ? ?PAIN:  ?Are you having pain? no ?NPRS scale: 1/10 ?Pain location: back R hip ?Pain orientation: Bilateral  ?PAIN TYPE: aching ?Pain description: intermittent  ?Aggravating factors: rotation of hips, bending flexing back, walking /standing for longer than 15 min ?Relieving factors: Keeping back stiff, avoiding rotation ? ? ? ? ?OBJECTIVE:  ? *Unless otherwise noted, objective measures collected previously* ? ?DIAGNOSTIC FINDINGS: 11/12/17 ?IMPRESSION: ?1. No acute osseous abnormality or metastatic disease identified in ?the lumbar spine. ?2. Posterior dermis associated 14 mm cyst at the L1 level appears ?benign, such as sebaceous cyst. ?3. Transitional lumbosacral anatomy. Same numbering system as on the ?2007  MRI designating a mostly sacralized L5 level. ?4. Progressed lumbar spine degeneration since 2007. New or ?increased: ?- moderate spinal stenosis at L2-L3 and L3-L4. ?- moderate to severe left L4 neural foraminal stenosis. ?- moderate left L3 neural foraminal stenosis. ?-mild spinal stenosis at T12-L1 and L1-L2. ?  ?PATIENT  SURVEYS:  ?FOTO 55% predicted 60% ?12-25-21  63% ?  ?SCREENING FOR RED FLAGS: ?Bowel or bladder incontinence: No ?Cauda equina syndrome: No ?Compression fracture: No ?  ?  ?COGNITION: ?         Overall cognitive status: Within functional limits for tasks assessed              ?         Pt is HOH ?SENSATION: ?         Light touch: Deficits numb in R ant thigha ?         Stereognosis: Appears intact ?         Hot/Cold: Appears intact ?         Proprioception: Appears intact ?  ?MUSCLE LENGTH: ?Hamstrings: Right 50 deg; Left 50 deg ?Thomas test: bil tightness moderate ?  ?POSTURE:  ?Flexed trunk posture, PPT forward head and rounded shoulders ?  ?PALPATION: ?TTP over bil low back lumbar and TTP over R>L ?  ?LUMBARAROM/PROM ?  ?A/PROM A/PROM  ?12/06/2021  ?Flexion Finger tip to anterior thigh bil  ?Extension 0 increase with pain  ?Right lateral flexion 2 in above knee jt line  ?Left lateral flexion 2 inch above knee jt line  ?Right rotation 50%  ?Left rotation 50%  ? (Blank rows = not tested) ?  ?LE AROM/PROM: ?  ?A/PROM Right ?12/06/2021 Left ?12/06/2021  ?Hip flexion 70 76  ?Hip extension      ?Hip abduction      ?Hip adduction      ?Hip internal rotation 18 23  ?Hip external rotation 30 45  ?Knee flexion 130 127  ?Knee extension -5 -8  ?Ankle dorsiflexion 10 9  ?Ankle plantarflexion      ?Ankle inversion      ?Ankle eversion      ? (Blank rows = not tested) ?  ?LE MMT: ?  ?MMT Right ?12/06/2021 Left ?12/06/2021  ?Hip flexion 3- 3+  ?Hip extension 3- 3-  ?Hip abduction 3- 3-  ?Hip adduction      ?Hip internal rotation      ?Hip external rotation      ?Knee flexion 4- 4-  ?Knee extension 4- 4-  ?Ankle dorsiflexion      ?Ankle plantarflexion      ?Ankle inversion      ?Ankle eversion      ? (Blank rows = not tested) ?  ?LUMBAR SPECIAL TESTS:  ?Straight leg raise test: Negative, Slump test: Negative, and FABER test: Positive ?  ?FUNCTIONAL TESTS:  ?5 times sit to stand: 19.4 sec ?6 minute walk test: TBA ?Berg Balance Scale:  46/56 ?  ?GAIT: ?Distance walked: 150 feet ?Assistive device utilized: None ?Level of assistance: Complete Independence ?Gait Velocity   1.99 ft /sec ?Comments: Pt gait with decreased hip flexion ?  ?BERG BALANCE ?  ?Sitting to Standing: Numbers; 0-4: 4 ?     4. Stands without using hands and stabilize independently ?     3. Stands independently using hands ?     2. Stands using hands after multiple trials ?     1. Min A to stand ?     0.  Mod-Max A to stand ?Standing unsupported: Numbers; 0-4: 4 ?     4. Stands safely for 2 minutes ?     3. Stands 2 minutes with supervision ?     2. Stands 30 seconds unsupported ?     1. Needs several tries to stand unsupported for 30 seconds ?     0. Unable to stand unsupported for 30 seconds ?Sitting unsupported: Numbers; 0-4: 4 ?     4. Sits for 2 minutes independently ?     3. Sits for 2 minutes with supervision ?     2. Able to sit 30 seconds ?     1. Able to sit 10 seconds ?     0. Unable to sit for 10 seconds ?Standing to Sitting: Numbers; 0-4: 4 ?4. Sits safely with minimal use of hands ?3. Controls descent with hands ?2. Uses back of legs against chair to control descent ?1. Sits independently, but uncontrolled descent ?0. Needs assistance ?Transfers: Numbers; 0-4: 4 ?     4. Transfers safely with minor use of hands ?     3. Transfers safely definite use of hands ?     2. Transfers with verbal cueing/supervision ?     1. Needs 1 person assist ?     0. Needs 2 person assist  ?Standing with eyes closed: Numbers; 0-4: 4 ?     4. Stands safely for 10 seconds ?     3. Stands 10 seconds with supervision  ?     2. Able to stand for 3 seconds ?     1. Unable to keep eyes closed for 3 seconds, but is safe ?     0. Needs assist to keep from falling ?Standing with feet together: Numbers; 0-4: 3 ?4. Stands for 1 minute safely ?3. Stands for 1 minute with supervision ?2. Unable to hold for 30 seconds ?     1. Needs help to attain position but can hold for 15 seconds ?     0. Needs help  to attain position and unable to hold for 15 seconds ?Reaching forward with outstretched arm: Numbers; 0-4: 3 ?     4. Reaches forward 10 inches ?     3. Reaches forward 5 inches ?     2. Reaches forward 2

## 2021-12-27 NOTE — Therapy (Signed)
?OUTPATIENT PHYSICAL THERAPY TREATMENT NOTE ? ? ?Patient Name: Raymond Hurley. ?MRN: [id number] ?DOB:06/28/1931, 87 y.o., male ?Today's Date: 12/27/2021 ? ?PCP: Lajean Manes, MD ?REFERRING PROVIDER: Lajean Manes, MD ? ? PT End of Session - 12/27/21 1533   ? ? Visit Number 6   ? Number of Visits 16   ? Date for PT Re-Evaluation 01/31/22   ? Authorization Type MCR A and B / BCBS FOTO on 6th and 10th visit   ? Progress Note Due on Visit 10   ? PT Start Time [phone]   ? PT Stop Time [phone]   ? PT Time Calculation (min) 40 min   ? Equipment Utilized During Treatment Gait belt   ? Activity Tolerance Patient tolerated treatment well   ? Behavior During Therapy [facility] for tasks assessed/performed   ? ?  ?  ? ?  ? ? ? ? ? ?Past Medical History:  ?Diagnosis Date  ? Acute pancreatitis   ? Atrial fibrillation ([place])   ? CAD (coronary artery disease) 04/2019  ? CHF (congestive heart failure) ([place])   ? Chronic kidney disease   ? Melanoma ([place])   ? Vitamin B 12 deficiency   ? ?Past Surgical History:  ?Procedure Laterality Date  ? CARDIOVERSION N/A 06/02/2019  ? Procedure: CARDIOVERSION;  Surgeon: Jerline Pain, MD;  Location: [facility] ENDOSCOPY;  Service: Cardiovascular;  Laterality: N/A;  ? IR FLUORO GUIDE CV LINE RIGHT  11/06/2020  ? IR US GUIDE VASC ACCESS RIGHT  11/06/2020  ? RIGHT/LEFT HEART CATH AND CORONARY ANGIOGRAPHY N/A 04/27/2019  ? Procedure: RIGHT/LEFT HEART CATH AND CORONARY ANGIOGRAPHY;  Surgeon: Jolaine Artist, MD;  Location: [place] CV LAB;  Service: Cardiovascular;  Laterality: N/A;  ? TEE WITHOUT CARDIOVERSION N/A 06/02/2019  ? Procedure: TRANSESOPHAGEAL ECHOCARDIOGRAM (TEE);  Surgeon: Jerline Pain, MD;  Location: [facility] ENDOSCOPY;  Service: Cardiovascular;  Laterality: N/A;  ? ?Patient Active Problem List  ? Diagnosis Date Noted  ? Basal cell carcinoma of left side of nose 08/14/2021  ? Chronic pancreatitis ([place]) 02/05/2021  ? Pulmonary embolus ([place]) 02/05/2021  ? Protein-calorie malnutrition, severe 12/06/2020  ?  Dysphagia   ? Slow transit constipation   ? Hemodialysis-associated hypotension   ? Dizziness and giddiness   ? Acute on chronic anemia   ? ESRD on dialysis [facility])   ? Palliative care by specialist   ? Debility   ? Thrombocytopenia ([place])   ? Chronic systolic congestive heart failure ([place])   ? Acute respiratory failure ([place])   ? AKI (acute kidney injury) ([place])   ? Dehydration   ? Persistent atrial fibrillation ([place])   ? Severe sepsis ([place]) 10/14/2020  ? Pneumonia due to COVID-19 virus 10/14/2020  ? Chronic systolic CHF (congestive heart failure) ([place]) 10/14/2020  ? Elevated troponin 10/14/2020  ? Facial weakness 10/14/2020  ? Hypocalcemia 10/14/2020  ? Macrocytic anemia 10/14/2020  ? Acute metabolic encephalopathy 45/80/9983  ? PAF (paroxysmal atrial fibrillation) ([place]) 07/05/2020  ? Anticoagulated 07/05/2020  ? NICM (nonischemic cardiomyopathy) ([place]) 07/05/2020  ? History of melanoma 07/05/2020  ? ? ?REFERRING DIAG: R26.9 (ICD-10-CM) - Unspecified abnormalities of gait and mobility ? ?THERAPY DIAG:  ?Unsteadiness on feet ? ?Dehydration ? ?Stiffness of right hip, not elsewhere classified ? ?Stiffness of left hip, not elsewhere classified ? ?Muscle weakness (generalized) ? ?Debility ? ?PERTINENT HISTORY: Debility, long Hospitalization with sepsis and decubitus ulcer,   thrombocytopenia Atrial fibrilation. Melanoma,  chronic pancreatitis, ESRD (3 x a week down to 1 x  a week) extensive medical hx see medical chart. ? ? ?SUBJECTIVE: We did a lot of lifting last session.  I'm a little sore.  My back gets irritated from lifting. ? ?PAIN:  ?Are you having pain? no ?NPRS scale: 1/10 ?Pain location: back R hip ?Pain orientation: Bilateral  ?PAIN TYPE: aching ?Pain description: intermittent  ?Aggravating factors: rotation of hips, bending flexing back, walking /standing for longer than 15 min ?Relieving factors: Keeping back stiff, avoiding rotation ? ? ? ? ?OBJECTIVE:  ? *Unless otherwise noted, objective measures collected  previously* ? ?DIAGNOSTIC FINDINGS: 11/12/17 ?IMPRESSION: ?1. No acute osseous abnormality or metastatic disease identified in ?the lumbar spine. ?2. Posterior dermis associated 14 mm cyst at the L1 level appears ?benign, such as sebaceous cyst. ?3. Transitional lumbosacral anatomy. Same numbering system as on the ?2007 MRI designating a mostly sacralized L5 level. ?4. Progressed lumbar spine degeneration since 2007. New or ?increased: ?- moderate spinal stenosis at L2-L3 and L3-L4. ?- moderate to severe left L4 neural foraminal stenosis. ?- moderate left L3 neural foraminal stenosis. ?-mild spinal stenosis at T12-L1 and L1-L2. ?  ?PATIENT SURVEYS:  ?FOTO 55% predicted 60% ?12-25-21  63% ?  ?SCREENING FOR RED FLAGS: ?Bowel or bladder incontinence: No ?Cauda equina syndrome: No ?Compression fracture: No ?  ?  ?COGNITION: ?         Overall cognitive status: Within functional limits for tasks assessed              ?         Pt is HOH ?SENSATION: ?         Light touch: Deficits numb in R ant thigha ?         Stereognosis: Appears intact ?         Hot/Cold: Appears intact ?         Proprioception: Appears intact ?  ?MUSCLE LENGTH: ?Hamstrings: Right 50 deg; Left 50 deg ?Thomas test: bil tightness moderate ?  ?POSTURE:  ?Flexed trunk posture, PPT forward head and rounded shoulders ?  ?PALPATION: ?TTP over bil low back lumbar and TTP over R>L ?  ?LUMBARAROM/PROM ?  ?A/PROM A/PROM  ?12/06/2021  ?Flexion Finger tip to anterior thigh bil  ?Extension 0 increase with pain  ?Right lateral flexion 2 in above knee jt line  ?Left lateral flexion 2 inch above knee jt line  ?Right rotation 50%  ?Left rotation 50%  ? (Blank rows = not tested) ?  ?LE AROM/PROM: ?  ?A/PROM Right ?12/06/2021 Left ?12/06/2021  ?Hip flexion 70 76  ?Hip extension      ?Hip abduction      ?Hip adduction      ?Hip internal rotation 18 23  ?Hip external rotation 30 45  ?Knee flexion 130 127  ?Knee extension -5 -8  ?Ankle dorsiflexion 10 9  ?Ankle plantarflexion       ?Ankle inversion      ?Ankle eversion      ? (Blank rows = not tested) ?  ?LE MMT: ?  ?MMT Right ?12/06/2021 Left ?12/06/2021  ?Hip flexion 3- 3+  ?Hip extension 3- 3-  ?Hip abduction 3- 3-  ?Hip adduction      ?Hip internal rotation      ?Hip external rotation      ?Knee flexion 4- 4-  ?Knee extension 4- 4-  ?Ankle dorsiflexion      ?Ankle plantarflexion      ?Ankle inversion      ?Ankle eversion      ? (  Blank rows = not tested) ?  ?LUMBAR SPECIAL TESTS:  ?Straight leg raise test: Negative, Slump test: Negative, and FABER test: Positive ?  ?FUNCTIONAL TESTS:  ?5 times sit to stand: 19.4 sec ?6 minute walk test: TBA ?Berg Balance Scale: 46/56 ?  ?GAIT: ?Distance walked: 150 feet ?Assistive device utilized: None ?Level of assistance: Complete Independence ?Gait Velocity   1.99 ft /sec ?Comments: Pt gait with decreased hip flexion ?  ?BERG BALANCE ?  ?Sitting to Standing: Numbers; 0-4: 4 ?     4. Stands without using hands and stabilize independently ?     3. Stands independently using hands ?     2. Stands using hands after multiple trials ?     1. Min A to stand ?     0. Mod-Max A to stand ?Standing unsupported: Numbers; 0-4: 4 ?     4. Stands safely for 2 minutes ?     3. Stands 2 minutes with supervision ?     2. Stands 30 seconds unsupported ?     1. Needs several tries to stand unsupported for 30 seconds ?     0. Unable to stand unsupported for 30 seconds ?Sitting unsupported: Numbers; 0-4: 4 ?     4. Sits for 2 minutes independently ?     3. Sits for 2 minutes with supervision ?     2. Able to sit 30 seconds ?     1. Able to sit 10 seconds ?     0. Unable to sit for 10 seconds ?Standing to Sitting: Numbers; 0-4: 4 ?4. Sits safely with minimal use of hands ?3. Controls descent with hands ?2. Uses back of legs against chair to control descent ?1. Sits independently, but uncontrolled descent ?0. Needs assistance ?Transfers: Numbers; 0-4: 4 ?     4. Transfers safely with minor use of hands ?     3. Transfers safely  definite use of hands ?     2. Transfers with verbal cueing/supervision ?     1. Needs 1 person assist ?     0. Needs 2 person assist  ?Standing with eyes closed: Numbers; 0-4: 4 ?     4. Stands safely

## 2021-12-31 NOTE — Therapy (Addendum)
?OUTPATIENT PHYSICAL THERAPY TREATMENT NOTE ? ? ?Patient Name: Raymond Hurley. ?MRN: [id number] ?DOB:1931-09-02, 88 y.o., male ?Today's Date: 01/01/2022 ? ?PCP: Lajean Manes, MD ?REFERRING PROVIDER: Lajean Manes, MD ? ? PT End of Session - 01/01/22 1656   ? ? Visit Number 7   ? Number of Visits 16   ? Date for PT Re-Evaluation 01/31/22   ? Authorization Type MCR A and B / BCBS FOTO on 6th and 10th visit   ? Progress Note Due on Visit 10   ? PT Start Time 1550   ? PT Stop Time 4098   ? PT Time Calculation (min) 44 min   ? Equipment Utilized During Treatment Gait belt   ? Activity Tolerance Patient tolerated treatment well   ? Behavior During Therapy [facility] for tasks assessed/performed   ? ?  ?  ? ?  ? ? ? ? ? ? ?Past Medical History:  ?Diagnosis Date  ? Acute pancreatitis   ? Atrial fibrillation ([place])   ? CAD (coronary artery disease) 04/2019  ? CHF (congestive heart failure) ([place])   ? Chronic kidney disease   ? Melanoma ([place])   ? Vitamin B 12 deficiency   ? ?Past Surgical History:  ?Procedure Laterality Date  ? CARDIOVERSION N/A 06/02/2019  ? Procedure: CARDIOVERSION;  Surgeon: Jerline Pain, MD;  Location: [facility] ENDOSCOPY;  Service: Cardiovascular;  Laterality: N/A;  ? IR FLUORO GUIDE CV LINE RIGHT  11/06/2020  ? IR US GUIDE VASC ACCESS RIGHT  11/06/2020  ? RIGHT/LEFT HEART CATH AND CORONARY ANGIOGRAPHY N/A 04/27/2019  ? Procedure: RIGHT/LEFT HEART CATH AND CORONARY ANGIOGRAPHY;  Surgeon: Jolaine Artist, MD;  Location: [place] CV LAB;  Service: Cardiovascular;  Laterality: N/A;  ? TEE WITHOUT CARDIOVERSION N/A 06/02/2019  ? Procedure: TRANSESOPHAGEAL ECHOCARDIOGRAM (TEE);  Surgeon: Jerline Pain, MD;  Location: [facility] ENDOSCOPY;  Service: Cardiovascular;  Laterality: N/A;  ? ?Patient Active Problem List  ? Diagnosis Date Noted  ? Basal cell carcinoma of left side of nose 08/14/2021  ? Chronic pancreatitis ([place]) 02/05/2021  ? Pulmonary embolus ([place]) 02/05/2021  ? Protein-calorie malnutrition, severe 12/06/2020   ? Dysphagia   ? Slow transit constipation   ? Hemodialysis-associated hypotension   ? Dizziness and giddiness   ? Acute on chronic anemia   ? ESRD on dialysis [facility])   ? Palliative care by specialist   ? Debility   ? Thrombocytopenia ([place])   ? Chronic systolic congestive heart failure ([place])   ? Acute respiratory failure ([place])   ? AKI (acute kidney injury) ([place])   ? Dehydration   ? Persistent atrial fibrillation ([place])   ? Severe sepsis ([place]) 10/14/2020  ? Pneumonia due to COVID-19 virus 10/14/2020  ? Chronic systolic CHF (congestive heart failure) ([place]) 10/14/2020  ? Elevated troponin 10/14/2020  ? Facial weakness 10/14/2020  ? Hypocalcemia 10/14/2020  ? Macrocytic anemia 10/14/2020  ? Acute metabolic encephalopathy 11/91/4782  ? PAF (paroxysmal atrial fibrillation) ([place]) 07/05/2020  ? Anticoagulated 07/05/2020  ? NICM (nonischemic cardiomyopathy) ([place]) 07/05/2020  ? History of melanoma 07/05/2020  ? ? ?REFERRING DIAG: R26.9 (ICD-10-CM) - Unspecified abnormalities of gait and mobility ? ?THERAPY DIAG:  ?Unsteadiness on feet ? ?Dehydration ? ?Stiffness of right hip, not elsewhere classified ? ?Stiffness of left hip, not elsewhere classified ? ?Muscle weakness (generalized) ? ?Debility ? ?PERTINENT HISTORY: Debility, long Hospitalization with sepsis and decubitus ulcer,   thrombocytopenia Atrial fibrilation. Melanoma,  chronic pancreatitis, ESRD (3 x a week down to 1  x a week) extensive medical hx see medical chart. ? ? ?SUBJECTIVE:  ? ?I am dragging better today. Went up and down steps at [employer] this past weekend.It tired me out  ? ?PAIN:  ?Are you having pain? no ?NPRS scale:0/10 ?Pain location: back R hip ?Pain orientation: Bilateral  ?PAIN TYPE: aching ?Pain description: intermittent  ?Aggravating factors: rotation of hips, bending flexing back, walking /standing for longer than 15 min ?Relieving factors: Keeping back stiff, avoiding rotation ? ? ? ? ?OBJECTIVE:  ? *Unless otherwise noted, objective  measures collected previously* ? ?DIAGNOSTIC FINDINGS: 11/12/17 ?IMPRESSION: ?1. No acute osseous abnormality or metastatic disease identified in ?the lumbar spine. ?2. Posterior dermis associated 14 mm cyst at the L1 level appears ?benign, such as sebaceous cyst. ?3. Transitional lumbosacral anatomy. Same numbering system as on the ?2007 MRI designating a mostly sacralized L5 level. ?4. Progressed lumbar spine degeneration since 2007. New or ?increased: ?- moderate spinal stenosis at L2-L3 and L3-L4. ?- moderate to severe left L4 neural foraminal stenosis. ?- moderate left L3 neural foraminal stenosis. ?-mild spinal stenosis at T12-L1 and L1-L2. ?  ?PATIENT SURVEYS:  ?FOTO 55% predicted 60% ?12-25-21  63% ?  ?SCREENING FOR RED FLAGS: ?Bowel or bladder incontinence: No ?Cauda equina syndrome: No ?Compression fracture: No ?  ?  ?COGNITION: ?         Overall cognitive status: Within functional limits for tasks assessed              ?         Pt is HOH ?SENSATION: ?         Light touch: Deficits numb in R ant thigha ?         Stereognosis: Appears intact ?         Hot/Cold: Appears intact ?         Proprioception: Appears intact ?  ?MUSCLE LENGTH: ?Hamstrings: Right 50 deg; Left 50 deg ?Thomas test: bil tightness moderate ?  ?POSTURE:  ?Flexed trunk posture, PPT forward head and rounded shoulders ?  ?PALPATION: ?TTP over bil low back lumbar and TTP over R>L ?  ?LUMBARAROM/PROM ?  ?A/PROM A/PROM  ?12/06/2021 A/PROM  ?01/01/2022  ?Flexion Finger tip to anterior thigh bil Fingertip to floor  ?Extension 0 increase with pain 10  ?Right lateral flexion 2 in above knee jt line 1 inch below knee jt line  ?Left lateral flexion 2 inch above knee jt line 1.5 inch below knee jt line  ?Right rotation 50% 25% limited  ?Left rotation 50% 25%limited  ? (Blank rows = not tested) ?  ?LE AROM/PROM: ?  ?A/PROM Right ?12/06/2021 Left ?12/06/2021  ?Hip flexion 70 76  ?Hip extension      ?Hip abduction      ?Hip adduction      ?Hip internal  rotation 18 23  ?Hip external rotation 30 45  ?Knee flexion 130 127  ?Knee extension -5 -8  ?Ankle dorsiflexion 10 9  ?Ankle plantarflexion      ?Ankle inversion      ?Ankle eversion      ? (Blank rows = not tested) ?  ?LE MMT: ?  ?MMT Right ?12/06/2021 Left ?12/06/2021  ?Hip flexion 3- 3+  ?Hip extension 3- 3-  ?Hip abduction 3- 3-  ?Hip adduction      ?Hip internal rotation      ?Hip external rotation      ?Knee flexion 4- 4-  ?Knee extension 4- 4-  ?Ankle  dorsiflexion      ?Ankle plantarflexion      ?Ankle inversion      ?Ankle eversion      ? (Blank rows = not tested) ?  ?LUMBAR SPECIAL TESTS:  ?Straight leg raise test: Negative, Slump test: Negative, and FABER test: Positive ?  ?FUNCTIONAL TESTS:  ?5 times sit to stand: 19.4 sec ?01-01-22 5 x STS  12.75 sec ?6 minute walk test: TBA ?Berg Balance Scale: 46/56 ?01-01-22 Merrilee Jansky 48/56 ?  ?GAIT: ?Distance walked: 150 feet ?Assistive device utilized: None ?Level of assistance: Complete Independence ?Gait Velocity   1.99 ft /sec ?Comments: Pt gait with decreased hip flexion ?  ?BERG BALANCE ?  ?Sitting to Standing: Numbers; 0-4: 4 ?     4. Stands without using hands and stabilize independently ?     3. Stands independently using hands ?     2. Stands using hands after multiple trials ?     1. Min A to stand ?     0. Mod-Max A to stand ?Standing unsupported: Numbers; 0-4: 4 ?     4. Stands safely for 2 minutes ?     3. Stands 2 minutes with supervision ?     2. Stands 30 seconds unsupported ?     1. Needs several tries to stand unsupported for 30 seconds ?     0. Unable to stand unsupported for 30 seconds ?Sitting unsupported: Numbers; 0-4: 4 ?     4. Sits for 2 minutes independently ?     3. Sits for 2 minutes with supervision ?     2. Able to sit 30 seconds ?     1. Able to sit 10 seconds ?     0. Unable to sit for 10 seconds ?Standing to Sitting: Numbers; 0-4: 4 ?4. Sits safely with minimal use of hands ?3. Controls descent with hands ?2. Uses back of legs against chair  to control descent ?1. Sits independently, but uncontrolled descent ?0. Needs assistance ?Transfers: Numbers; 0-4: 4 ?     4. Transfers safely with minor use of hands ?     3. Transfers safely definite use of hands

## 2022-01-01 ENCOUNTER — Ambulatory Visit: Payer: Medicare Other | Admitting: Physical Therapy

## 2022-01-01 ENCOUNTER — Encounter: Payer: Self-pay | Admitting: Physical Therapy

## 2022-01-01 ENCOUNTER — Other Ambulatory Visit: Payer: Self-pay

## 2022-01-01 DIAGNOSIS — M25651 Stiffness of right hip, not elsewhere classified: Secondary | ICD-10-CM

## 2022-01-01 DIAGNOSIS — M6281 Muscle weakness (generalized): Secondary | ICD-10-CM

## 2022-01-01 DIAGNOSIS — E860 Dehydration: Secondary | ICD-10-CM

## 2022-01-01 DIAGNOSIS — M25652 Stiffness of left hip, not elsewhere classified: Secondary | ICD-10-CM

## 2022-01-01 DIAGNOSIS — R2681 Unsteadiness on feet: Secondary | ICD-10-CM | POA: Diagnosis not present

## 2022-01-01 DIAGNOSIS — R5381 Other malaise: Secondary | ICD-10-CM | POA: Diagnosis not present

## 2022-01-01 NOTE — Therapy (Addendum)
?OUTPATIENT PHYSICAL THERAPY TREATMENT NOTE ? ? ?Patient Name: Raymond Hurley. ?MRN: [id number] ?DOB:1930-12-26, 88 y.o., male ?Today's Date: 01/03/2022 ? ?PCP: Lajean Manes, MD ?REFERRING PROVIDER: Lajean Manes, MD ? ? PT End of Session - 01/03/22 1613   ? ? Visit Number 8   ? Number of Visits 16   ? Date for PT Re-Evaluation 01/31/22   ? Authorization Type MCR A and B / BCBS FOTO on 6th and 10th visit   ? PT Start Time 1550   ? PT Stop Time 1093   ? PT Time Calculation (min) 41 min   ? ?  ?  ? ?  ? ? ? ? ? ? ? ?Past Medical History:  ?Diagnosis Date  ? Acute pancreatitis   ? Atrial fibrillation ([place])   ? CAD (coronary artery disease) 04/2019  ? CHF (congestive heart failure) ([place])   ? Chronic kidney disease   ? Melanoma ([place])   ? Vitamin B 12 deficiency   ? ?Past Surgical History:  ?Procedure Laterality Date  ? CARDIOVERSION N/A 06/02/2019  ? Procedure: CARDIOVERSION;  Surgeon: Jerline Pain, MD;  Location: [facility] ENDOSCOPY;  Service: Cardiovascular;  Laterality: N/A;  ? IR FLUORO GUIDE CV LINE RIGHT  11/06/2020  ? IR US GUIDE VASC ACCESS RIGHT  11/06/2020  ? RIGHT/LEFT HEART CATH AND CORONARY ANGIOGRAPHY N/A 04/27/2019  ? Procedure: RIGHT/LEFT HEART CATH AND CORONARY ANGIOGRAPHY;  Surgeon: Jolaine Artist, MD;  Location: [place] CV LAB;  Service: Cardiovascular;  Laterality: N/A;  ? TEE WITHOUT CARDIOVERSION N/A 06/02/2019  ? Procedure: TRANSESOPHAGEAL ECHOCARDIOGRAM (TEE);  Surgeon: Jerline Pain, MD;  Location: [facility] ENDOSCOPY;  Service: Cardiovascular;  Laterality: N/A;  ? ?Patient Active Problem List  ? Diagnosis Date Noted  ? Basal cell carcinoma of left side of nose 08/14/2021  ? Chronic pancreatitis ([place]) 02/05/2021  ? Pulmonary embolus ([place]) 02/05/2021  ? Protein-calorie malnutrition, severe 12/06/2020  ? Dysphagia   ? Slow transit constipation   ? Hemodialysis-associated hypotension   ? Dizziness and giddiness   ? Acute on chronic anemia   ? ESRD on dialysis [facility])   ? Palliative care by specialist    ? Debility   ? Thrombocytopenia ([place])   ? Chronic systolic congestive heart failure ([place])   ? Acute respiratory failure ([place])   ? AKI (acute kidney injury) ([place])   ? Dehydration   ? Persistent atrial fibrillation ([place])   ? Severe sepsis ([place]) 10/14/2020  ? Pneumonia due to COVID-19 virus 10/14/2020  ? Chronic systolic CHF (congestive heart failure) ([place]) 10/14/2020  ? Elevated troponin 10/14/2020  ? Facial weakness 10/14/2020  ? Hypocalcemia 10/14/2020  ? Macrocytic anemia 10/14/2020  ? Acute metabolic encephalopathy 23/55/7322  ? PAF (paroxysmal atrial fibrillation) ([place]) 07/05/2020  ? Anticoagulated 07/05/2020  ? NICM (nonischemic cardiomyopathy) ([place]) 07/05/2020  ? History of melanoma 07/05/2020  ? ? ?REFERRING DIAG: R26.9 (ICD-10-CM) - Unspecified abnormalities of gait and mobility ? ?THERAPY DIAG:  ?Unsteadiness on feet ? ?Stiffness of left hip, not elsewhere classified ? ?Muscle weakness (generalized) ? ?Dehydration ? ?Stiffness of right hip, not elsewhere classified ? ?Debility ? ?PERTINENT HISTORY: Debility, long Hospitalization with sepsis and decubitus ulcer,   thrombocytopenia Atrial fibrilation. Melanoma,  chronic pancreatitis, ESRD (3 x a week down to 1 x a week) extensive medical hx see medical chart. ? ? ?SUBJECTIVE: I was sore after doing all that balance stuff ? ?PAIN:  ?Are you having pain? no ?NPRS scale:0110 ?Pain location: back R hip ?  Pain orientation: Bilateral  ?PAIN TYPE: aching ?Pain description: intermittent  ?Aggravating factors: rotation of hips, bending flexing back, walking /standing for longer than 15 min ?Relieving factors: Keeping back stiff, avoiding rotation ? ? ? ? ?OBJECTIVE:  ? *Unless otherwise noted, objective measures collected previously* ? ?DIAGNOSTIC FINDINGS: 11/12/17 ?IMPRESSION: ?1. No acute osseous abnormality or metastatic disease identified in ?the lumbar spine. ?2. Posterior dermis associated 14 mm cyst at the L1 level appears ?benign, such as sebaceous cyst. ?3.  Transitional lumbosacral anatomy. Same numbering system as on the ?2007 MRI designating a mostly sacralized L5 level. ?4. Progressed lumbar spine degeneration since 2007. New or ?increased: ?- moderate spinal stenosis at L2-L3 and L3-L4. ?- moderate to severe left L4 neural foraminal stenosis. ?- moderate left L3 neural foraminal stenosis. ?-mild spinal stenosis at T12-L1 and L1-L2. ?  ?PATIENT SURVEYS:  ?FOTO 55% predicted 60% ?12-25-21  63% ?  ?SCREENING FOR RED FLAGS: ?Bowel or bladder incontinence: No ?Cauda equina syndrome: No ?Compression fracture: No ?  ?  ?COGNITION: ?         Overall cognitive status: Within functional limits for tasks assessed              ?         Pt is HOH ?SENSATION: ?         Light touch: Deficits numb in R ant thigha ?         Stereognosis: Appears intact ?         Hot/Cold: Appears intact ?         Proprioception: Appears intact ?  ?MUSCLE LENGTH: ?Hamstrings: Right 50 deg; Left 50 deg ?Thomas test: bil tightness moderate ?  ?POSTURE:  ?Flexed trunk posture, PPT forward head and rounded shoulders ?  ?PALPATION: ?TTP over bil low back lumbar and TTP over R>L ?  ?LUMBARAROM/PROM ?  ?A/PROM A/PROM  ?12/06/2021 A/PROM  ?01/01/2022  ?Flexion Finger tip to anterior thigh bil Fingertip to floor  ?Extension 0 increase with pain 10  ?Right lateral flexion 2 in above knee jt line 1 inch below knee jt line  ?Left lateral flexion 2 inch above knee jt line 1.5 inch below knee jt line  ?Right rotation 50% 25% limited  ?Left rotation 50% 25%limited  ? (Blank rows = not tested) ?  ?LE AROM/PROM: ?  ?A/PROM Right ?12/06/2021 Left ?12/06/2021  ?Hip flexion 70 76  ?Hip extension      ?Hip abduction      ?Hip adduction      ?Hip internal rotation 18 23  ?Hip external rotation 30 45  ?Knee flexion 130 127  ?Knee extension -5 -8  ?Ankle dorsiflexion 10 9  ?Ankle plantarflexion      ?Ankle inversion      ?Ankle eversion      ? (Blank rows = not tested) ?  ?LE MMT: ?  ?MMT Right ?12/06/2021 Left ?12/06/2021  ?Hip  flexion 3- 3+  ?Hip extension 3- 3-  ?Hip abduction 3- 3-  ?Hip adduction      ?Hip internal rotation      ?Hip external rotation      ?Knee flexion 4- 4-  ?Knee extension 4- 4-  ?Ankle dorsiflexion      ?Ankle plantarflexion      ?Ankle inversion      ?Ankle eversion      ? (Blank rows = not tested) ?  ?LUMBAR SPECIAL TESTS:  ?Straight leg raise test: Negative, Slump test: Negative, and FABER test:  Positive ?  ?FUNCTIONAL TESTS:  ?5 times sit to stand: 19.4 sec ?01-01-22 5 x STS  12.75 sec ?6 minute walk test: TBA ?Berg Balance Scale: 46/56 ?01-01-22 Merrilee Jansky 48/56 ?  ?GAIT: ?Distance walked: 150 feet ?Assistive device utilized: None ?Level of assistance: Complete Independence ?Gait Velocity   1.99 ft /sec ?Comments: Pt gait with decreased hip flexion ?  ?BERG BALANCE ?  ?Sitting to Standing: Numbers; 0-4: 4 ?     4. Stands without using hands and stabilize independently ?     3. Stands independently using hands ?     2. Stands using hands after multiple trials ?     1. Min A to stand ?     0. Mod-Max A to stand ?Standing unsupported: Numbers; 0-4: 4 ?     4. Stands safely for 2 minutes ?     3. Stands 2 minutes with supervision ?     2. Stands 30 seconds unsupported ?     1. Needs several tries to stand unsupported for 30 seconds ?     0. Unable to stand unsupported for 30 seconds ?Sitting unsupported: Numbers; 0-4: 4 ?     4. Sits for 2 minutes independently ?     3. Sits for 2 minutes with supervision ?     2. Able to sit 30 seconds ?     1. Able to sit 10 seconds ?     0. Unable to sit for 10 seconds ?Standing to Sitting: Numbers; 0-4: 4 ?4. Sits safely with minimal use of hands ?3. Controls descent with hands ?2. Uses back of legs against chair to control descent ?1. Sits independently, but uncontrolled descent ?0. Needs assistance ?Transfers: Numbers; 0-4: 4 ?     4. Transfers safely with minor use of hands ?     3. Transfers safely definite use of hands ?     2. Transfers with verbal cueing/supervision ?     1.  Needs 1 person assist ?     0. Needs 2 person assist  ?Standing with eyes closed: Numbers; 0-4: 4 ?     4. Stands safely for 10 seconds ?     3. Stands 10 seconds with supervision  ?     2. Able to stan

## 2022-01-02 DIAGNOSIS — N186 End stage renal disease: Secondary | ICD-10-CM | POA: Diagnosis not present

## 2022-01-02 DIAGNOSIS — D509 Iron deficiency anemia, unspecified: Secondary | ICD-10-CM | POA: Diagnosis not present

## 2022-01-02 DIAGNOSIS — N2581 Secondary hyperparathyroidism of renal origin: Secondary | ICD-10-CM | POA: Diagnosis not present

## 2022-01-02 DIAGNOSIS — Z992 Dependence on renal dialysis: Secondary | ICD-10-CM | POA: Diagnosis not present

## 2022-01-02 DIAGNOSIS — Z23 Encounter for immunization: Secondary | ICD-10-CM | POA: Diagnosis not present

## 2022-01-03 ENCOUNTER — Other Ambulatory Visit: Payer: Self-pay

## 2022-01-03 ENCOUNTER — Ambulatory Visit: Payer: Medicare Other | Admitting: Physical Therapy

## 2022-01-03 ENCOUNTER — Encounter: Payer: Self-pay | Admitting: Physical Therapy

## 2022-01-03 DIAGNOSIS — D1801 Hemangioma of skin and subcutaneous tissue: Secondary | ICD-10-CM | POA: Diagnosis not present

## 2022-01-03 DIAGNOSIS — E860 Dehydration: Secondary | ICD-10-CM

## 2022-01-03 DIAGNOSIS — R2681 Unsteadiness on feet: Secondary | ICD-10-CM | POA: Diagnosis not present

## 2022-01-03 DIAGNOSIS — R5381 Other malaise: Secondary | ICD-10-CM | POA: Diagnosis not present

## 2022-01-03 DIAGNOSIS — Z85820 Personal history of malignant melanoma of skin: Secondary | ICD-10-CM | POA: Diagnosis not present

## 2022-01-03 DIAGNOSIS — L821 Other seborrheic keratosis: Secondary | ICD-10-CM | POA: Diagnosis not present

## 2022-01-03 DIAGNOSIS — M25652 Stiffness of left hip, not elsewhere classified: Secondary | ICD-10-CM | POA: Diagnosis not present

## 2022-01-03 DIAGNOSIS — Z85828 Personal history of other malignant neoplasm of skin: Secondary | ICD-10-CM | POA: Diagnosis not present

## 2022-01-03 DIAGNOSIS — D485 Neoplasm of uncertain behavior of skin: Secondary | ICD-10-CM | POA: Diagnosis not present

## 2022-01-03 DIAGNOSIS — C44329 Squamous cell carcinoma of skin of other parts of face: Secondary | ICD-10-CM | POA: Diagnosis not present

## 2022-01-03 DIAGNOSIS — L812 Freckles: Secondary | ICD-10-CM | POA: Diagnosis not present

## 2022-01-03 DIAGNOSIS — L570 Actinic keratosis: Secondary | ICD-10-CM | POA: Diagnosis not present

## 2022-01-03 DIAGNOSIS — M6281 Muscle weakness (generalized): Secondary | ICD-10-CM | POA: Diagnosis not present

## 2022-01-03 DIAGNOSIS — M25651 Stiffness of right hip, not elsewhere classified: Secondary | ICD-10-CM | POA: Diagnosis not present

## 2022-01-08 ENCOUNTER — Other Ambulatory Visit: Payer: Self-pay

## 2022-01-08 ENCOUNTER — Encounter: Payer: Self-pay | Admitting: Physical Therapy

## 2022-01-08 ENCOUNTER — Ambulatory Visit: Payer: Medicare Other | Admitting: Physical Therapy

## 2022-01-08 DIAGNOSIS — R5381 Other malaise: Secondary | ICD-10-CM

## 2022-01-08 DIAGNOSIS — E860 Dehydration: Secondary | ICD-10-CM | POA: Diagnosis not present

## 2022-01-08 DIAGNOSIS — M6281 Muscle weakness (generalized): Secondary | ICD-10-CM | POA: Diagnosis not present

## 2022-01-08 DIAGNOSIS — M25651 Stiffness of right hip, not elsewhere classified: Secondary | ICD-10-CM

## 2022-01-08 DIAGNOSIS — M25652 Stiffness of left hip, not elsewhere classified: Secondary | ICD-10-CM

## 2022-01-08 DIAGNOSIS — R2681 Unsteadiness on feet: Secondary | ICD-10-CM | POA: Diagnosis not present

## 2022-01-08 NOTE — Therapy (Signed)
?OUTPATIENT PHYSICAL THERAPY TREATMENT NOTE ? ? ?Patient Name: Raymond Hurley. ?MRN: [id number] ?DOB:07/14/1931, 88 y.o., male ?Today's Date: 01/08/2022 ? ?PCP: Lajean Manes, MD ?REFERRING PROVIDER: Lajean Manes, MD ? ? PT End of Session - 01/08/22 1644   ? ? Visit Number 9   ? Number of Visits 16   ? Date for PT Re-Evaluation 01/31/22   ? Authorization Type MCR A and B / BCBS FOTO on 6th and 10th visit   ? PT Start Time 1540   ? PT Stop Time 1630   ? PT Time Calculation (min) 50 min   ? Activity Tolerance Patient tolerated treatment well   ? Behavior During Therapy [facility] for tasks assessed/performed   ? ?  ?  ? ?  ? ? ? ? ? ? ? ? ?Past Medical History:  ?Diagnosis Date  ? Acute pancreatitis   ? Atrial fibrillation ([place])   ? CAD (coronary artery disease) 04/2019  ? CHF (congestive heart failure) ([place])   ? Chronic kidney disease   ? Melanoma ([place])   ? Vitamin B 12 deficiency   ? ?Past Surgical History:  ?Procedure Laterality Date  ? CARDIOVERSION N/A 06/02/2019  ? Procedure: CARDIOVERSION;  Surgeon: Jerline Pain, MD;  Location: [facility] ENDOSCOPY;  Service: Cardiovascular;  Laterality: N/A;  ? IR FLUORO GUIDE CV LINE RIGHT  11/06/2020  ? IR US GUIDE VASC ACCESS RIGHT  11/06/2020  ? RIGHT/LEFT HEART CATH AND CORONARY ANGIOGRAPHY N/A 04/27/2019  ? Procedure: RIGHT/LEFT HEART CATH AND CORONARY ANGIOGRAPHY;  Surgeon: Jolaine Artist, MD;  Location: [place] CV LAB;  Service: Cardiovascular;  Laterality: N/A;  ? TEE WITHOUT CARDIOVERSION N/A 06/02/2019  ? Procedure: TRANSESOPHAGEAL ECHOCARDIOGRAM (TEE);  Surgeon: Jerline Pain, MD;  Location: [facility] ENDOSCOPY;  Service: Cardiovascular;  Laterality: N/A;  ? ?Patient Active Problem List  ? Diagnosis Date Noted  ? Basal cell carcinoma of left side of nose 08/14/2021  ? Chronic pancreatitis ([place]) 02/05/2021  ? Pulmonary embolus ([place]) 02/05/2021  ? Protein-calorie malnutrition, severe 12/06/2020  ? Dysphagia   ? Slow transit constipation   ? Hemodialysis-associated  hypotension   ? Dizziness and giddiness   ? Acute on chronic anemia   ? ESRD on dialysis [facility])   ? Palliative care by specialist   ? Debility   ? Thrombocytopenia ([place])   ? Chronic systolic congestive heart failure ([place])   ? Acute respiratory failure ([place])   ? AKI (acute kidney injury) ([place])   ? Dehydration   ? Persistent atrial fibrillation ([place])   ? Severe sepsis ([place]) 10/14/2020  ? Pneumonia due to COVID-19 virus 10/14/2020  ? Chronic systolic CHF (congestive heart failure) ([place]) 10/14/2020  ? Elevated troponin 10/14/2020  ? Facial weakness 10/14/2020  ? Hypocalcemia 10/14/2020  ? Macrocytic anemia 10/14/2020  ? Acute metabolic encephalopathy 25/85/2778  ? PAF (paroxysmal atrial fibrillation) ([place]) 07/05/2020  ? Anticoagulated 07/05/2020  ? NICM (nonischemic cardiomyopathy) ([place]) 07/05/2020  ? History of melanoma 07/05/2020  ? ? ?REFERRING DIAG: R26.9 (ICD-10-CM) - Unspecified abnormalities of gait and mobility ? ?THERAPY DIAG:  ?Unsteadiness on feet ? ?Dehydration ? ?Stiffness of left hip, not elsewhere classified ? ?Stiffness of right hip, not elsewhere classified ? ?Muscle weakness (generalized) ? ?Debility ? ?PERTINENT HISTORY: Debility, long Hospitalization with sepsis and decubitus ulcer,   thrombocytopenia Atrial fibrilation. Melanoma,  chronic pancreatitis, ESRD (3 x a week down to 1 x a week) extensive medical hx see medical chart. ? ? ?SUBJECTIVE: I have no pain ? ?  PAIN:  ?Are you having pain? no ?NPRS scale:0110 ?Pain location: back R hip ?Pain orientation: Bilateral  ?PAIN TYPE: aching ?Pain description: intermittent  ?Aggravating factors: rotation of hips, bending flexing back, walking /standing for longer than 15 min ?Relieving factors: Keeping back stiff, avoiding rotation ? ? ? ? ?OBJECTIVE:  ? *Unless otherwise noted, objective measures collected previously* ? ?DIAGNOSTIC FINDINGS: 11/12/17 ?IMPRESSION: ?1. No acute osseous abnormality or metastatic disease identified in ?the lumbar spine. ?2.  Posterior dermis associated 14 mm cyst at the L1 level appears ?benign, such as sebaceous cyst. ?3. Transitional lumbosacral anatomy. Same numbering system as on the ?2007 MRI designating a mostly sacralized L5 level. ?4. Progressed lumbar spine degeneration since 2007. New or ?increased: ?- moderate spinal stenosis at L2-L3 and L3-L4. ?- moderate to severe left L4 neural foraminal stenosis. ?- moderate left L3 neural foraminal stenosis. ?-mild spinal stenosis at T12-L1 and L1-L2. ?  ?PATIENT SURVEYS:  ?FOTO 55% predicted 60% ?12-25-21  63% ?  ?SCREENING FOR RED FLAGS: ?Bowel or bladder incontinence: No ?Cauda equina syndrome: No ?Compression fracture: No ?  ?  ?COGNITION: ?         Overall cognitive status: Within functional limits for tasks assessed              ?         Pt is HOH ?SENSATION: ?         Light touch: Deficits numb in R ant thigha ?         Stereognosis: Appears intact ?         Hot/Cold: Appears intact ?         Proprioception: Appears intact ?  ?MUSCLE LENGTH: ?Hamstrings: Right 50 deg; Left 50 deg ?Thomas test: bil tightness moderate ?  ?POSTURE:  ?Flexed trunk posture, PPT forward head and rounded shoulders ?  ?PALPATION: ?TTP over bil low back lumbar and TTP over R>L ?  ?LUMBARAROM/PROM ?  ?A/PROM A/PROM  ?12/06/2021 A/PROM  ?01/01/2022  ?Flexion Finger tip to anterior thigh bil Fingertip to floor  ?Extension 0 increase with pain 10  ?Right lateral flexion 2 in above knee jt line 1 inch below knee jt line  ?Left lateral flexion 2 inch above knee jt line 1.5 inch below knee jt line  ?Right rotation 50% 25% limited  ?Left rotation 50% 25%limited  ? (Blank rows = not tested) ?  ?LE AROM/PROM: ?  ?A/PROM Right ?12/06/2021 Left ?12/06/2021  ?Hip flexion 70 76  ?Hip extension      ?Hip abduction      ?Hip adduction      ?Hip internal rotation 18 23  ?Hip external rotation 30 45  ?Knee flexion 130 127  ?Knee extension -5 -8  ?Ankle dorsiflexion 10 9  ?Ankle plantarflexion      ?Ankle inversion      ?Ankle  eversion      ? (Blank rows = not tested) ?  ?LE MMT: ?  ?MMT Right ?12/06/2021 Left ?12/06/2021  ?Hip flexion 3- 3+  ?Hip extension 3- 3-  ?Hip abduction 3- 3-  ?Hip adduction      ?Hip internal rotation      ?Hip external rotation      ?Knee flexion 4- 4-  ?Knee extension 4- 4-  ?Ankle dorsiflexion      ?Ankle plantarflexion      ?Ankle inversion      ?Ankle eversion      ? (Blank rows = not tested) ?  ?LUMBAR  SPECIAL TESTS:  ?Straight leg raise test: Negative, Slump test: Negative, and FABER test: Positive ?  ?FUNCTIONAL TESTS:  ?5 times sit to stand: 19.4 sec ?01-01-22 5 x STS  12.75 sec ?6 minute walk test: TBA ?Berg Balance Scale: 46/56 ?01-01-22 Merrilee Jansky 48/56 ?  ?GAIT: ?Distance walked: 150 feet ?Assistive device utilized: None ?Level of assistance: Complete Independence ?Gait Velocity   1.99 ft /sec ?Comments: Pt gait with decreased hip flexion ?  ?BERG BALANCE ?  ?Sitting to Standing: Numbers; 0-4: 4 ?     4. Stands without using hands and stabilize independently ?     3. Stands independently using hands ?     2. Stands using hands after multiple trials ?     1. Min A to stand ?     0. Mod-Max A to stand ?Standing unsupported: Numbers; 0-4: 4 ?     4. Stands safely for 2 minutes ?     3. Stands 2 minutes with supervision ?     2. Stands 30 seconds unsupported ?     1. Needs several tries to stand unsupported for 30 seconds ?     0. Unable to stand unsupported for 30 seconds ?Sitting unsupported: Numbers; 0-4: 4 ?     4. Sits for 2 minutes independently ?     3. Sits for 2 minutes with supervision ?     2. Able to sit 30 seconds ?     1. Able to sit 10 seconds ?     0. Unable to sit for 10 seconds ?Standing to Sitting: Numbers; 0-4: 4 ?4. Sits safely with minimal use of hands ?3. Controls descent with hands ?2. Uses back of legs against chair to control descent ?1. Sits independently, but uncontrolled descent ?0. Needs assistance ?Transfers: Numbers; 0-4: 4 ?     4. Transfers safely with minor use of hands ?     3.  Transfers safely definite use of hands ?     2. Transfers with verbal cueing/supervision ?     1. Needs 1 person assist ?     0. Needs 2 person assist  ?Standing with eyes closed: Numbers; 0-4: 4 ?     4. Stands

## 2022-01-09 DIAGNOSIS — Z23 Encounter for immunization: Secondary | ICD-10-CM | POA: Diagnosis not present

## 2022-01-09 DIAGNOSIS — N2581 Secondary hyperparathyroidism of renal origin: Secondary | ICD-10-CM | POA: Diagnosis not present

## 2022-01-09 DIAGNOSIS — Z992 Dependence on renal dialysis: Secondary | ICD-10-CM | POA: Diagnosis not present

## 2022-01-09 DIAGNOSIS — D509 Iron deficiency anemia, unspecified: Secondary | ICD-10-CM | POA: Diagnosis not present

## 2022-01-09 DIAGNOSIS — N186 End stage renal disease: Secondary | ICD-10-CM | POA: Diagnosis not present

## 2022-01-09 NOTE — Therapy (Signed)
?OUTPATIENT PHYSICAL THERAPY TREATMENT NOTE/PROGRESS NOTE ? ?Progress Note ?Reporting Period 12-06-21 to 01-10-22 ? ?See note below for Objective Data and Assessment of Progress/Goals.  ? ?  ?Patient Name: Raymond Hurley. ?MRN: [id number] ?DOB:April 02, 1931, 88 y.o., male ?Today's Date: 01/10/2022 ? ?PCP: Lajean Manes, MD ?REFERRING PROVIDER: Lajean Manes, MD ? ? PT End of Session - 01/10/22 1544   ? ? Visit Number 10   ? Number of Visits 16   ? Date for PT Re-Evaluation 01/31/22   ? Authorization Type MCR A and B / BCBS FOTO on 6th and 10th visit   ? Progress Note Due on Visit 10   ? PT Start Time 1545   ? PT Stop Time 1630   ? PT Time Calculation (min) 45 min   ? Activity Tolerance Patient tolerated treatment well   ? Behavior During Therapy [facility] for tasks assessed/performed   ? ?  ?  ? ?  ? ? ? ? ? ? ? ? ? ?Past Medical History:  ?Diagnosis Date  ? Acute pancreatitis   ? Atrial fibrillation ([place])   ? CAD (coronary artery disease) 04/2019  ? CHF (congestive heart failure) ([place])   ? Chronic kidney disease   ? Melanoma ([place])   ? Vitamin B 12 deficiency   ? ?Past Surgical History:  ?Procedure Laterality Date  ? CARDIOVERSION N/A 06/02/2019  ? Procedure: CARDIOVERSION;  Surgeon: Jerline Pain, MD;  Location: [facility] ENDOSCOPY;  Service: Cardiovascular;  Laterality: N/A;  ? IR FLUORO GUIDE CV LINE RIGHT  11/06/2020  ? IR US GUIDE VASC ACCESS RIGHT  11/06/2020  ? RIGHT/LEFT HEART CATH AND CORONARY ANGIOGRAPHY N/A 04/27/2019  ? Procedure: RIGHT/LEFT HEART CATH AND CORONARY ANGIOGRAPHY;  Surgeon: Jolaine Artist, MD;  Location: [place] CV LAB;  Service: Cardiovascular;  Laterality: N/A;  ? TEE WITHOUT CARDIOVERSION N/A 06/02/2019  ? Procedure: TRANSESOPHAGEAL ECHOCARDIOGRAM (TEE);  Surgeon: Jerline Pain, MD;  Location: [facility] ENDOSCOPY;  Service: Cardiovascular;  Laterality: N/A;  ? ?Patient Active Problem List  ? Diagnosis Date Noted  ? Basal cell carcinoma of left side of nose 08/14/2021  ? Chronic pancreatitis ([place])  02/05/2021  ? Pulmonary embolus ([place]) 02/05/2021  ? Protein-calorie malnutrition, severe 12/06/2020  ? Dysphagia   ? Slow transit constipation   ? Hemodialysis-associated hypotension   ? Dizziness and giddiness   ? Acute on chronic anemia   ? ESRD on dialysis [facility])   ? Palliative care by specialist   ? Debility   ? Thrombocytopenia ([place])   ? Chronic systolic congestive heart failure ([place])   ? Acute respiratory failure ([place])   ? AKI (acute kidney injury) ([place])   ? Dehydration   ? Persistent atrial fibrillation ([place])   ? Severe sepsis ([place]) 10/14/2020  ? Pneumonia due to COVID-19 virus 10/14/2020  ? Chronic systolic CHF (congestive heart failure) ([place]) 10/14/2020  ? Elevated troponin 10/14/2020  ? Facial weakness 10/14/2020  ? Hypocalcemia 10/14/2020  ? Macrocytic anemia 10/14/2020  ? Acute metabolic encephalopathy 08/67/6195  ? PAF (paroxysmal atrial fibrillation) ([place]) 07/05/2020  ? Anticoagulated 07/05/2020  ? NICM (nonischemic cardiomyopathy) ([place]) 07/05/2020  ? History of melanoma 07/05/2020  ? ? ?REFERRING DIAG: R26.9 (ICD-10-CM) - Unspecified abnormalities of gait and mobility ? ?THERAPY DIAG:  ?Unsteadiness on feet ? ?Dehydration ? ?Stiffness of left hip, not elsewhere classified ? ?Debility ? ?Muscle weakness (generalized) ? ?Stiffness of right hip, not elsewhere classified ? ?PERTINENT HISTORY: Debility, long Hospitalization with sepsis and decubitus ulcer,  thrombocytopenia Atrial fibrilation. Melanoma,  chronic pancreatitis, ESRD (3 x a week down to 1 x a week) extensive medical hx see medical chart. ? ? ?SUBJECTIVE: I have no pain ? ?PAIN:  ?Are you having pain? no ?NPRS scale:0110 ?Pain location: back R hip ?Pain orientation: Bilateral  ?PAIN TYPE: aching ?Pain description: intermittent  ?Aggravating factors: rotation of hips, bending flexing back, walking /standing for longer than 15 min ?Relieving factors: Keeping back stiff, avoiding rotation ? ? ? ? ?OBJECTIVE:  ? *Unless otherwise noted, objective  measures collected previously* ? ?DIAGNOSTIC FINDINGS: 11/12/17 ?IMPRESSION: ?1. No acute osseous abnormality or metastatic disease identified in ?the lumbar spine. ?2. Posterior dermis associated 14 mm cyst at the L1 level appears ?benign, such as sebaceous cyst. ?3. Transitional lumbosacral anatomy. Same numbering system as on the ?2007 MRI designating a mostly sacralized L5 level. ?4. Progressed lumbar spine degeneration since 2007. New or ?increased: ?- moderate spinal stenosis at L2-L3 and L3-L4. ?- moderate to severe left L4 neural foraminal stenosis. ?- moderate left L3 neural foraminal stenosis. ?-mild spinal stenosis at T12-L1 and L1-L2. ?  ?PATIENT SURVEYS:  ?FOTO 55% predicted 60% ?12-25-21  63% ?01-10-22 57% ?  ?SCREENING FOR RED FLAGS: ?Bowel or bladder incontinence: No ?Cauda equina syndrome: No ?Compression fracture: No ?  ?  ?COGNITION: ?         Overall cognitive status: Within functional limits for tasks assessed              ?         Pt is HOH ?SENSATION: ?         Light touch: Deficits numb in R ant thigha ?         Stereognosis: Appears intact ?         Hot/Cold: Appears intact ?         Proprioception: Appears intact ?  ?MUSCLE LENGTH: ?Hamstrings: Right 50 deg; Left 50 deg ?Thomas test: bil tightness moderate  01-10-22  right 23, Left 25 ?  ?POSTURE:  ?Flexed trunk posture, PPT forward head and rounded shoulders ?  ?PALPATION: ?TTP over bil low back lumbar and TTP over R>L ?  ?LUMBARAROM/PROM ?  ?A/PROM A/PROM  ?12/06/2021 A/PROM  ?01/01/2022 A/PROM ?01-10-22  ?Flexion Finger tip to anterior thigh bil Fingertip to floor Finger tip to floor  ?Extension 0 increase with pain 10 10  ?Right lateral flexion 2 in above knee jt line 1 inch below knee jt line 1 inch below knee jt line  ?Left lateral flexion 2 inch above knee jt line 1.5 inch below knee jt line 1.5 inch below knee jt line  ?Right rotation 50% 25% limited 25% limited  ?Left rotation 50% 25%limited 25%limited  ? (Blank rows = not tested) ?  ?LE  AROM/PROM: ?  ?A/PROM Right ?12/06/2021 Left ?12/06/2021 Right ?01-10-22 Left ?01-10-22  ?Hip flexion 70 76 80 92  ?Hip extension        ?Hip abduction        ?Hip adduction        ?Hip internal rotation '18 23 21 23  '$ ?Hip external rotation 30 45 30 45  ?Knee flexion 130 127 130 131  ?Knee extension -5 -8 -5 -5  ?Ankle dorsiflexion '10 9 11 11  '$ ?Ankle plantarflexion        ?Ankle inversion        ?Ankle eversion        ? (Blank rows = not tested) ?  ?LE MMT: ?  ?  MMT Right ?12/06/2021 Left ?12/06/2021  ?Hip flexion 3- 4-  ?Hip extension 3- 3-  ?Hip abduction 3- 3-  ?Hip adduction      ?Hip internal rotation      ?Hip external rotation      ?Knee flexion 4- 4-  ?Knee extension 4- 4-  ?Ankle dorsiflexion      ?Ankle plantarflexion      ?Ankle inversion      ?Ankle eversion      ? (Blank rows = not tested) ?  ?LUMBAR SPECIAL TESTS:  ?Straight leg raise test: Negative, Slump test: Negative, and FABER test: Positive ?  ?FUNCTIONAL TESTS:  ?5 times sit to stand: 19.4 sec ?01-01-22 5 x STS  12.75 sec ?6 minute walk test: TBA ?Berg Balance Scale: 46/56 ?01-01-22 Merrilee Jansky 48/56 ?  ?GAIT: ?Distance walked: 150 feet ?Assistive device utilized: None ?Level of assistance: Complete Independence ?Gait Velocity   1.99 ft /sec ?Comments: Pt gait with decreased hip flexion ?  ?BERG BALANCE ?  ?Sitting to Standing: Numbers; 0-4: 4 ?     4. Stands without using hands and stabilize independently ?     3. Stands independently using hands ?     2. Stands using hands after multiple trials ?     1. Min A to stand ?     0. Mod-Max A to stand ?Standing unsupported: Numbers; 0-4: 4 ?     4. Stands safely for 2 minutes ?     3. Stands 2 minutes with supervision ?     2. Stands 30 seconds unsupported ?     1. Needs several tries to stand unsupported for 30 seconds ?     0. Unable to stand unsupported for 30 seconds ?Sitting unsupported: Numbers; 0-4: 4 ?     4. Sits for 2 minutes independently ?     3. Sits for 2 minutes with supervision ?     2. Able to sit  30 seconds ?     1. Able to sit 10 seconds ?     0. Unable to sit for 10 seconds ?Standing to Sitting: Numbers; 0-4: 4 ?4. Sits safely with minimal use of hands ?3. Controls descent with hands ?2. Uses b

## 2022-01-10 ENCOUNTER — Ambulatory Visit: Payer: Medicare Other | Admitting: Physical Therapy

## 2022-01-10 DIAGNOSIS — R5381 Other malaise: Secondary | ICD-10-CM

## 2022-01-10 DIAGNOSIS — M6281 Muscle weakness (generalized): Secondary | ICD-10-CM

## 2022-01-10 DIAGNOSIS — M25651 Stiffness of right hip, not elsewhere classified: Secondary | ICD-10-CM | POA: Diagnosis not present

## 2022-01-10 DIAGNOSIS — R2681 Unsteadiness on feet: Secondary | ICD-10-CM | POA: Diagnosis not present

## 2022-01-10 DIAGNOSIS — M25652 Stiffness of left hip, not elsewhere classified: Secondary | ICD-10-CM

## 2022-01-10 DIAGNOSIS — E860 Dehydration: Secondary | ICD-10-CM | POA: Diagnosis not present

## 2022-01-12 DIAGNOSIS — N186 End stage renal disease: Secondary | ICD-10-CM | POA: Diagnosis not present

## 2022-01-12 DIAGNOSIS — Z992 Dependence on renal dialysis: Secondary | ICD-10-CM | POA: Diagnosis not present

## 2022-01-12 DIAGNOSIS — I129 Hypertensive chronic kidney disease with stage 1 through stage 4 chronic kidney disease, or unspecified chronic kidney disease: Secondary | ICD-10-CM | POA: Diagnosis not present

## 2022-01-14 NOTE — Therapy (Addendum)
?OUTPATIENT PHYSICAL THERAPY TREATMENT NOTE/PROGRESS NOTE ? ?Progress Note ?Reporting Period 12-06-21 to 01-10-22 ? ?See note below for Objective Data and Assessment of Progress/Goals.  ? ?  ?Patient Name: Raymond Hurley. ?MRN: [id number] ?DOB:1931/05/01, 88 y.o., male ?Today's Date: 01/15/2022 ? ?PCP: Lajean Manes, MD ?REFERRING PROVIDER: Lajean Manes, MD ? ? PT End of Session - 01/15/22 1554   ? ? Visit Number 11   ? Number of Visits 16   ? Date for PT Re-Evaluation 01/31/22   ? Authorization Type MCR A and B / BCBS FOTO on 6th and 10th visit   ? Progress Note Due on Visit 20   ? PT Start Time 1545   ?    PT Stop Time                                                                1630 ?                                                                                         45  ?  ? ?  ? ? ? ? ? ? ? ? ? ? ?Past Medical History:  ?Diagnosis Date  ? Acute pancreatitis   ? Atrial fibrillation ([place])   ? CAD (coronary artery disease) 04/2019  ? CHF (congestive heart failure) ([place])   ? Chronic kidney disease   ? Melanoma ([place])   ? Vitamin B 12 deficiency   ? ?Past Surgical History:  ?Procedure Laterality Date  ? CARDIOVERSION N/A 06/02/2019  ? Procedure: CARDIOVERSION;  Surgeon: Jerline Pain, MD;  Location: [facility] ENDOSCOPY;  Service: Cardiovascular;  Laterality: N/A;  ? IR FLUORO GUIDE CV LINE RIGHT  11/06/2020  ? IR US GUIDE VASC ACCESS RIGHT  11/06/2020  ? RIGHT/LEFT HEART CATH AND CORONARY ANGIOGRAPHY N/A 04/27/2019  ? Procedure: RIGHT/LEFT HEART CATH AND CORONARY ANGIOGRAPHY;  Surgeon: Jolaine Artist, MD;  Location: [facility];  Service: Cardiovascular;  Laterality: N/A;  ? TEE WITHOUT CARDIOVERSION N/A 06/02/2019  ? Procedure: TRANSESOPHAGEAL ECHOCARDIOGRAM (TEE);  Surgeon: Jerline Pain, MD;  Location: [facility] ENDOSCOPY;  Service: Cardiovascular;  Laterality: N/A;  ? ?Patient Active Problem List  ? Diagnosis Date Noted  ? Basal cell carcinoma of left side of nose 08/14/2021  ? Chronic pancreatitis ([place])  02/05/2021  ? Pulmonary embolus ([place]) 02/05/2021  ? Protein-calorie malnutrition, severe 12/06/2020  ? Dysphagia   ? Slow transit constipation   ? Hemodialysis-associated hypotension   ? Dizziness and giddiness   ? Acute on chronic anemia   ? ESRD on dialysis [facility])   ? Palliative care by specialist   ? Debility   ? Thrombocytopenia ([place])   ? Chronic systolic congestive heart failure ([place])   ? Acute respiratory failure ([place])   ? AKI (acute kidney injury) ([place])   ? Dehydration   ? Persistent atrial fibrillation ([place])   ? Severe sepsis ([place]) 10/14/2020  ?  Pneumonia due to COVID-19 virus 10/14/2020  ? Chronic systolic CHF (congestive heart failure) ([place]) 10/14/2020  ? Elevated troponin 10/14/2020  ? Facial weakness 10/14/2020  ? Hypocalcemia 10/14/2020  ? Macrocytic anemia 10/14/2020  ? Acute metabolic encephalopathy 76/73/4193  ? PAF (paroxysmal atrial fibrillation) ([place]) 07/05/2020  ? Anticoagulated 07/05/2020  ? NICM (nonischemic cardiomyopathy) ([place]) 07/05/2020  ? History of melanoma 07/05/2020  ? ? ?REFERRING DIAG: R26.9 (ICD-10-CM) - Unspecified abnormalities of gait and mobility ? ?THERAPY DIAG:  ?Unsteadiness on feet ? ?Debility ? ?Muscle weakness (generalized) ? ?Stiffness of left hip, not elsewhere classified ? ?Stiffness of right hip, not elsewhere classified ? ?Dehydration ? ?PERTINENT HISTORY: Debility, long Hospitalization with sepsis and decubitus ulcer,   thrombocytopenia Atrial fibrilation. Melanoma,  chronic pancreatitis, ESRD (3 x a week down to 1 x a week) extensive medical hx see medical chart. ? ? ?SUBJECTIVE: I have no pain ? ?PAIN:  ?Are you having pain? no ?NPRS scale:0110 ?Pain location: back R hip ?Pain orientation: Bilateral  ?PAIN TYPE: aching ?Pain description: intermittent  ?Aggravating factors: rotation of hips, bending flexing back, walking /standing for longer than 15 min ?Relieving factors: Keeping back stiff, avoiding rotation ? ? ? ? ?OBJECTIVE:  ? *Unless otherwise noted, objective  measures collected previously* ? ?DIAGNOSTIC FINDINGS: 11/12/17 ?IMPRESSION: ?1. No acute osseous abnormality or metastatic disease identified in ?the lumbar spine. ?2. Posterior dermis associated 14 mm cyst at the L1 level appears ?benign, such as sebaceous cyst. ?3. Transitional lumbosacral anatomy. Same numbering system as on the ?2007 MRI designating a mostly sacralized L5 level. ?4. Progressed lumbar spine degeneration since 2007. New or ?increased: ?- moderate spinal stenosis at L2-L3 and L3-L4. ?- moderate to severe left L4 neural foraminal stenosis. ?- moderate left L3 neural foraminal stenosis. ?-mild spinal stenosis at T12-L1 and L1-L2. ?  ?PATIENT SURVEYS:  ?FOTO 55% predicted 60% ?12-25-21  63% ?01-10-22 57% ?  ?SCREENING FOR RED FLAGS: ?Bowel or bladder incontinence: No ?Cauda equina syndrome: No ?Compression fracture: No ?  ?  ?COGNITION: ?         Overall cognitive status: Within functional limits for tasks assessed              ?         Pt is HOH ?SENSATION: ?         Light touch: Deficits numb in R ant thigha ?         Stereognosis: Appears intact ?         Hot/Cold: Appears intact ?         Proprioception: Appears intact ?  ?MUSCLE LENGTH: ?Hamstrings: Right 50 deg; Left 50 deg ?Thomas test: bil tightness moderate  01-10-22  right 23, Left 25 ?  ?POSTURE:  ?Flexed trunk posture, PPT forward head and rounded shoulders ?  ?PALPATION: ?TTP over bil low back lumbar and TTP over R>L ?  ?LUMBARAROM/PROM ?  ?A/PROM A/PROM  ?12/06/2021 A/PROM  ?01/01/2022 A/PROM ?01-10-22  ?Flexion Finger tip to anterior thigh bil Fingertip to floor Finger tip to floor  ?Extension 0 increase with pain 10 10  ?Right lateral flexion 2 in above knee jt line 1 inch below knee jt line 1 inch below knee jt line  ?Left lateral flexion 2 inch above knee jt line 1.5 inch below knee jt line 1.5 inch below knee jt line  ?Right rotation 50% 25% limited 25% limited  ?Left rotation 50% 25%limited 25%limited  ? (Blank rows = not tested) ?  ?LE  AROM/PROM: ?  ?A/PROM Right ?12/06/2021 Left ?12/06/2021 Right ?01-10-22 Left ?01-10-22  ?Hip flexion 70 76 80 92  ?Hip extension        ?Hip abduction        ?Hip adduction        ?Hip internal rotation '18 23 21 23  '$ ?Hip external rotation 30 45 30 45  ?Knee flexion 130 127 130 131  ?Knee extension -5 -8 -5 -5  ?Ankle dorsiflexion '10 9 11 11  '$ ?Ankle plantarflexion        ?Ankle inversion        ?Ankle eversion        ? (Blank rows = not tested) ?  ?LE MMT: ?  ?MMT Right ?12/06/2021 Left ?12/06/2021  ?Hip flexion 3- 4-  ?Hip extension 3- 3-  ?Hip abduction 3- 3-  ?Hip adduction      ?Hip internal rotation      ?Hip external rotation      ?Knee flexion 4- 4-  ?Knee extension 4- 4-  ?Ankle dorsiflexion      ?Ankle plantarflexion      ?Ankle inversion      ?Ankle eversion      ? (Blank rows = not tested) ?  ?LUMBAR SPECIAL TESTS:  ?Straight leg raise test: Negative, Slump test: Negative, and FABER test: Positive ?  ?FUNCTIONAL TESTS:  ?5 times sit to stand: 19.4 sec ?01-01-22 5 x STS  12.75 sec ?6 minute walk test: TBA ?Berg Balance Scale: 46/56 ?01-01-22 Merrilee Jansky 48/56 ?  ?GAIT: ?Distance walked: 150 feet ?Assistive device utilized: None ?Level of assistance: Complete Independence ?Gait Velocity   1.99 ft /sec ?Comments: Pt gait with decreased hip flexion ?  ?BERG BALANCE ?  ?Sitting to Standing: Numbers; 0-4: 4 ?     4. Stands without using hands and stabilize independently ?     3. Stands independently using hands ?     2. Stands using hands after multiple trials ?     1. Min A to stand ?     0. Mod-Max A to stand ?Standing unsupported: Numbers; 0-4: 4 ?     4. Stands safely for 2 minutes ?     3. Stands 2 minutes with supervision ?     2. Stands 30 seconds unsupported ?     1. Needs several tries to stand unsupported for 30 seconds ?     0. Unable to stand unsupported for 30 seconds ?Sitting unsupported: Numbers; 0-4: 4 ?     4. Sits for 2 minutes independently ?     3. Sits for 2 minutes with supervision ?     2. Able to sit  30 seconds ?     1. Able to sit 10 seconds ?     0. Unable to sit for 10 seconds ?Standing to Sitting: Numbers; 0-4: 4 ?4. Sits safely with minimal use of hands ?3. Controls descent with hands ?2. Uses b

## 2022-01-15 ENCOUNTER — Ambulatory Visit: Payer: Medicare Other | Attending: Geriatric Medicine | Admitting: Physical Therapy

## 2022-01-15 DIAGNOSIS — E860 Dehydration: Secondary | ICD-10-CM | POA: Insufficient documentation

## 2022-01-15 DIAGNOSIS — M25652 Stiffness of left hip, not elsewhere classified: Secondary | ICD-10-CM | POA: Diagnosis not present

## 2022-01-15 DIAGNOSIS — R2681 Unsteadiness on feet: Secondary | ICD-10-CM | POA: Insufficient documentation

## 2022-01-15 DIAGNOSIS — M6281 Muscle weakness (generalized): Secondary | ICD-10-CM | POA: Insufficient documentation

## 2022-01-15 DIAGNOSIS — R5381 Other malaise: Secondary | ICD-10-CM | POA: Insufficient documentation

## 2022-01-15 DIAGNOSIS — M25651 Stiffness of right hip, not elsewhere classified: Secondary | ICD-10-CM | POA: Insufficient documentation

## 2022-01-16 ENCOUNTER — Telehealth: Payer: Self-pay | Admitting: Cardiology

## 2022-01-16 DIAGNOSIS — Z992 Dependence on renal dialysis: Secondary | ICD-10-CM | POA: Diagnosis not present

## 2022-01-16 DIAGNOSIS — D509 Iron deficiency anemia, unspecified: Secondary | ICD-10-CM | POA: Diagnosis not present

## 2022-01-16 DIAGNOSIS — R7989 Other specified abnormal findings of blood chemistry: Secondary | ICD-10-CM

## 2022-01-16 DIAGNOSIS — N2581 Secondary hyperparathyroidism of renal origin: Secondary | ICD-10-CM | POA: Diagnosis not present

## 2022-01-16 DIAGNOSIS — N186 End stage renal disease: Secondary | ICD-10-CM | POA: Diagnosis not present

## 2022-01-16 NOTE — Telephone Encounter (Signed)
?*  STAT* If patient is at the pharmacy, call can be transferred to refill team. ? ? ?1. Which medications need to be refilled? (please list name of each medication and dose if known) levothyroxine (SYNTHROID) 100 MCG tablet ? ?2. Which pharmacy/location (including street and city if local pharmacy) is medication to be sent to? [place], [place] - [address] ? ?3. Do they need a 30 day or 90 day supply? 90 day  ?

## 2022-01-16 NOTE — Therapy (Signed)
?OUTPATIENT PHYSICAL THERAPY TREATMENT NOTE ? ?Progress Note ?Reporting Period 12-06-21 to 01-10-22 ? ?See note below for Objective Data and Assessment of Progress/Goals.  ? ?  ?Patient Name: Raymond Hurley. ?MRN: [id number] ?DOB:[birth year], 88 y.o., male ?Today's Date: 01/17/2022 ? ?PCP: Lajean Manes, MD ?REFERRING PROVIDER: Lajean Manes, MD ? ? PT End of Session - 01/17/22 1656   ? ? Visit Number 12   ? Number of Visits 16   ? Date for PT Re-Evaluation 01/31/22   ? Authorization Type MCR A and B / BCBS FOTO on 6th and 10th visit   ? Progress Note Due on Visit 20   ? PT Start Time 1545   ? PT Stop Time 1630   ? PT Time Calculation (min) 45 min   ? Activity Tolerance Patient tolerated treatment well   ? Behavior During Therapy [facility] for tasks assessed/performed   ? ?  ?  ? ?  ? ? ? ? ? ? ? ? ? ? ? ?Past Medical History:  ?Diagnosis Date  ? Acute pancreatitis   ? Atrial fibrillation ([place])   ? CAD (coronary artery disease) 04/2019  ? CHF (congestive heart failure) ([place])   ? Chronic kidney disease   ? Melanoma ([place])   ? Vitamin B 12 deficiency   ? ?Past Surgical History:  ?Procedure Laterality Date  ? CARDIOVERSION N/A 06/02/2019  ? Procedure: CARDIOVERSION;  Surgeon: Jerline Pain, MD;  Location: [facility] ENDOSCOPY;  Service: Cardiovascular;  Laterality: N/A;  ? IR FLUORO GUIDE CV LINE RIGHT  11/06/2020  ? IR US GUIDE VASC ACCESS RIGHT  11/06/2020  ? RIGHT/LEFT HEART CATH AND CORONARY ANGIOGRAPHY N/A 04/27/2019  ? Procedure: RIGHT/LEFT HEART CATH AND CORONARY ANGIOGRAPHY;  Surgeon: Jolaine Artist, MD;  Location: [place] [facility];  Service: Cardiovascular;  Laterality: N/A;  ? TEE WITHOUT CARDIOVERSION N/A 06/02/2019  ? Procedure: TRANSESOPHAGEAL ECHOCARDIOGRAM (TEE);  Surgeon: Jerline Pain, MD;  Location: [facility] ENDOSCOPY;  Service: Cardiovascular;  Laterality: N/A;  ? ?Patient Active Problem List  ? Diagnosis Date Noted  ? Basal cell carcinoma of left side of nose 08/14/2021  ? Chronic pancreatitis ([place]) 02/05/2021  ?  Pulmonary embolus ([place] [employer]) 02/05/2021  ? Protein-calorie malnutrition, severe 12/06/2020  ? Dysphagia   ? Slow transit constipation   ? Hemodialysis-associated hypotension   ? Dizziness and giddiness   ? Acute on chronic anemia   ? ESRD on dialysis [facility])   ? Palliative care by specialist   ? Debility   ? Thrombocytopenia ([place])   ? Chronic systolic congestive heart failure ([place])   ? Acute respiratory failure ([place])   ? AKI (acute kidney injury) ([place])   ? Dehydration   ? Persistent atrial fibrillation ([place])   ? Severe sepsis ([place]) 10/14/2020  ? Pneumonia due to COVID-19 virus 10/14/2020  ? Chronic systolic CHF (congestive heart failure) ([place]) 10/14/2020  ? Elevated troponin 10/14/2020  ? Facial weakness 10/14/2020  ? Hypocalcemia 10/14/2020  ? Macrocytic anemia 10/14/2020  ? Acute metabolic encephalopathy 99/24/2683  ? PAF (paroxysmal atrial fibrillation) ([place]) 07/05/2020  ? Anticoagulated 07/05/2020  ? NICM (nonischemic cardiomyopathy) ([place]) 07/05/2020  ? History of melanoma 07/05/2020  ? ? ?REFERRING DIAG: R26.9 (ICD-10-CM) - Unspecified abnormalities of gait and mobility ? ?THERAPY DIAG:  ?Unsteadiness on feet ? ?Stiffness of left hip, not elsewhere classified ? ?Debility ? ?Stiffness of right hip, not elsewhere classified ? ?Muscle weakness (generalized) ? ?Dehydration ? ?PERTINENT HISTORY: Debility, long Hospitalization with sepsis and decubitus ulcer,  thrombocytopenia Atrial fibrilation. Melanoma,  chronic pancreatitis, ESRD (3 x a week down to 1 x a week) extensive medical hx see medical chart. ? ? ?SUBJECTIVE: Today I had a little pain in my back from doing the deadlifts the other day ? ?PAIN:  ?Are you having pain? no ?NPRS scale:3/10 ?Pain location: back R hip low back pain ?Pain orientation: Bilateral  ?PAIN TYPE: aching ?Pain description: intermittent  ?Aggravating factors: rotation of hips, bending flexing back, walking /standing for longer than 15 min ?Relieving factors: Keeping back stiff, avoiding  rotation ? ? ? ? ?OBJECTIVE:  ? *Unless otherwise noted, objective measures collected previously* ? ?DIAGNOSTIC FINDINGS: 11/12/17 ?IMPRESSION: ?1. No acute osseous abnormality or metastatic disease identified in ?the lumbar spine. ?2. Posterior dermis associated 14 mm cyst at the L1 level appears ?benign, such as sebaceous cyst. ?3. Transitional lumbosacral anatomy. Same numbering system as on the ?2007 MRI designating a mostly sacralized L5 level. ?4. Progressed lumbar spine degeneration since 2007. New or ?increased: ?- moderate spinal stenosis at L2-L3 and L3-L4. ?- moderate to severe left L4 neural foraminal stenosis. ?- moderate left L3 neural foraminal stenosis. ?-mild spinal stenosis at T12-L1 and L1-L2. ?  ?PATIENT SURVEYS:  ?FOTO 55% predicted 60% ?12-25-21  63% ?01-10-22 57% ?  ?SCREENING FOR RED FLAGS: ?Bowel or bladder incontinence: No ?Cauda equina syndrome: No ?Compression fracture: No ?  ?  ?COGNITION: ?         Overall cognitive status: Within functional limits for tasks assessed              ?         Pt is HOH ?SENSATION: ?         Light touch: Deficits numb in R ant thigha ?         Stereognosis: Appears intact ?         Hot/Cold: Appears intact ?         Proprioception: Appears intact ?  ?MUSCLE LENGTH: ?Hamstrings: Right 50 deg; Left 50 deg ?Thomas test: bil tightness moderate  01-10-22  right 23, Left 25 ?  ?POSTURE:  ?Flexed trunk posture, PPT forward head and rounded shoulders ?  ?PALPATION: ?TTP over bil low back lumbar and TTP over R>L ?  ?LUMBARAROM/PROM ?  ?A/PROM A/PROM  ?12/06/2021 A/PROM  ?01/01/2022 A/PROM ?01-10-22  ?Flexion Finger tip to anterior thigh bil Fingertip to floor Finger tip to floor  ?Extension 0 increase with pain 10 10  ?Right lateral flexion 2 in above knee jt line 1 inch below knee jt line 1 inch below knee jt line  ?Left lateral flexion 2 inch above knee jt line 1.5 inch below knee jt line 1.5 inch below knee jt line  ?Right rotation 50% 25% limited 25% limited  ?Left  rotation 50% 25%limited 25%limited  ? (Blank rows = not tested) ?  ?LE AROM/PROM: ?  ?A/PROM Right ?12/06/2021 Left ?12/06/2021 Right ?01-10-22 Left ?01-10-22  ?Hip flexion 70 76 80 92  ?Hip extension        ?Hip abduction        ?Hip adduction        ?Hip internal rotation '18 23 21 23  '$ ?Hip external rotation 30 45 30 45  ?Knee flexion 130 127 130 131  ?Knee extension -5 -8 -5 -5  ?Ankle dorsiflexion '10 9 11 11  '$ ?Ankle plantarflexion        ?Ankle inversion        ?Ankle eversion        ? (  Blank rows = not tested) ?  ?LE MMT: ?  ?MMT Right ?12/06/2021 Left ?12/06/2021  ?Hip flexion 3- 4-  ?Hip extension 3- 3-  ?Hip abduction 3- 3-  ?Hip adduction      ?Hip internal rotation      ?Hip external rotation      ?Knee flexion 4- 4-  ?Knee extension 4- 4-  ?Ankle dorsiflexion      ?Ankle plantarflexion      ?Ankle inversion      ?Ankle eversion      ? (Blank rows = not tested) ?  ?LUMBAR SPECIAL TESTS:  ?Straight leg raise test: Negative, Slump test: Negative, and FABER test: Positive ?  ?FUNCTIONAL TESTS:  ?5 times sit to stand: 19.4 sec ?01-01-22 5 x STS  12.75 sec ?6 minute walk test: TBA ?Berg Balance Scale: 46/56 ?01-01-22 Merrilee Jansky 48/56 ?  ?GAIT: ?Distance walked: 150 feet ?Assistive device utilized: None ?Level of assistance: Complete Independence ?Gait Velocity   1.99 ft /sec ?Comments: Pt gait with decreased hip flexion ?  ?BERG BALANCE ?  ?Sitting to Standing: Numbers; 0-4: 4 ?     4. Stands without using hands and stabilize independently ?     3. Stands independently using hands ?     2. Stands using hands after multiple trials ?     1. Min A to stand ?     0. Mod-Max A to stand ?Standing unsupported: Numbers; 0-4: 4 ?     4. Stands safely for 2 minutes ?     3. Stands 2 minutes with supervision ?     2. Stands 30 seconds unsupported ?     1. Needs several tries to stand unsupported for 30 seconds ?     0. Unable to stand unsupported for 30 seconds ?Sitting unsupported: Numbers; 0-4: 4 ?     4. Sits for 2 minutes  independently ?     3. Sits for 2 minutes with supervision ?     2. Able to sit 30 seconds ?     1. Able to sit 10 seconds ?     0. Unable to sit for 10 seconds ?Standing to Sitting: Numbers; 0-4: 4 ?4. Sits safely with

## 2022-01-17 ENCOUNTER — Encounter: Payer: Self-pay | Admitting: Physical Therapy

## 2022-01-17 ENCOUNTER — Ambulatory Visit: Payer: Medicare Other | Admitting: Physical Therapy

## 2022-01-17 DIAGNOSIS — M6281 Muscle weakness (generalized): Secondary | ICD-10-CM | POA: Diagnosis not present

## 2022-01-17 DIAGNOSIS — M25652 Stiffness of left hip, not elsewhere classified: Secondary | ICD-10-CM | POA: Diagnosis not present

## 2022-01-17 DIAGNOSIS — M25651 Stiffness of right hip, not elsewhere classified: Secondary | ICD-10-CM

## 2022-01-17 DIAGNOSIS — R5381 Other malaise: Secondary | ICD-10-CM | POA: Diagnosis not present

## 2022-01-17 DIAGNOSIS — E860 Dehydration: Secondary | ICD-10-CM | POA: Diagnosis not present

## 2022-01-17 DIAGNOSIS — R2681 Unsteadiness on feet: Secondary | ICD-10-CM | POA: Diagnosis not present

## 2022-01-17 MED ORDER — LEVOTHYROXINE SODIUM 88 MCG PO TABS: 88.0000 ug | ORAL_TABLET | Freq: Every day | ORAL | Status: DC

## 2022-01-17 NOTE — Patient Instructions (Signed)
[place] ? ?[email or web address]  ? ?[employer] ?[email or web address] ? ?Voncille Lo, PT, ATRIC ?Certified Exercise Expert for the Aging Adult  ?01/17/22 4:34 PM ?Phone: [phone] ?Fax: [phone]  ?

## 2022-01-18 ENCOUNTER — Telehealth: Payer: Self-pay | Admitting: Cardiology

## 2022-01-18 DIAGNOSIS — R7989 Other specified abnormal findings of blood chemistry: Secondary | ICD-10-CM

## 2022-01-18 NOTE — Telephone Encounter (Signed)
Pt c/o medication issue: ? ?1. Name of Medication: levothyroxine (SYNTHROID) 88 MCG tablet ? ?2. How are you currently taking this medication (dosage and times per day)? As written ? ?3. Are you having a reaction (difficulty breathing--STAT)? No  ? ?4. What is your medication issue? Patient needs a new prescription sent in to  [place], [place] - [address] ?

## 2022-01-18 NOTE — Telephone Encounter (Signed)
Will forward to MD to ensure okay to refill.  ?

## 2022-01-21 MED ORDER — LEVOTHYROXINE SODIUM 88 MCG PO TABS: 88.0000 ug | ORAL_TABLET | Freq: Every day | ORAL | 3 refills | Status: DC

## 2022-01-21 MED ORDER — LEVOTHYROXINE SODIUM 100 MCG PO TABS: 100.0000 ug | ORAL_TABLET | Freq: Every day | ORAL | 3 refills | Status: DC

## 2022-01-21 NOTE — Addendum Note (Signed)
Addended by: Cristopher Estimable on: 01/21/2022 03:45 PM ? ? Modules accepted: Orders ? ?

## 2022-01-21 NOTE — Telephone Encounter (Signed)
Refill sent to the pharmacy electronically.  

## 2022-01-21 NOTE — Telephone Encounter (Signed)
Pt c/o medication issue: ? ?1. Name of Medication: Synthroid 100 MCG ? ? ?2. How are you currently taking this medication (dosage and times per day)? N/A ? ?3. Are you having a reaction (difficulty breathing--STAT)? No ? ?4. What is your medication issue? Pt's wife  states that she needs the prescription for the above medication sent to pharmacy. She states that the prescription for the Synthroid 88MCG was sent and that's not the one that pt needs.  ?She states that Dr. Stanford Breed changed it to 100MCG. Please advise ?

## 2022-01-21 NOTE — Telephone Encounter (Signed)
Spoke with pt wife, she reports they have been checking his thyroid function and it has not changed so she wants to go ahead and increase to 100 mcg. New script sent to the pharmacy. She does report he is having loose stools since the recent increase. She will continue to monitor and if it becomes a problem she will let us know.  ?

## 2022-01-22 DIAGNOSIS — N2581 Secondary hyperparathyroidism of renal origin: Secondary | ICD-10-CM | POA: Diagnosis not present

## 2022-01-22 DIAGNOSIS — N186 End stage renal disease: Secondary | ICD-10-CM | POA: Diagnosis not present

## 2022-01-22 DIAGNOSIS — D509 Iron deficiency anemia, unspecified: Secondary | ICD-10-CM | POA: Diagnosis not present

## 2022-01-22 DIAGNOSIS — Z992 Dependence on renal dialysis: Secondary | ICD-10-CM | POA: Diagnosis not present

## 2022-01-23 DIAGNOSIS — K668 Other specified disorders of peritoneum: Secondary | ICD-10-CM | POA: Diagnosis not present

## 2022-01-23 DIAGNOSIS — I480 Paroxysmal atrial fibrillation: Secondary | ICD-10-CM | POA: Diagnosis not present

## 2022-01-23 DIAGNOSIS — Z79890 Hormone replacement therapy (postmenopausal): Secondary | ICD-10-CM | POA: Diagnosis not present

## 2022-01-23 DIAGNOSIS — C801 Malignant (primary) neoplasm, unspecified: Secondary | ICD-10-CM | POA: Diagnosis not present

## 2022-01-23 DIAGNOSIS — Z8616 Personal history of COVID-19: Secondary | ICD-10-CM | POA: Diagnosis not present

## 2022-01-23 DIAGNOSIS — Z85818 Personal history of malignant neoplasm of other sites of lip, oral cavity, and pharynx: Secondary | ICD-10-CM | POA: Diagnosis not present

## 2022-01-23 DIAGNOSIS — E538 Deficiency of other specified B group vitamins: Secondary | ICD-10-CM | POA: Diagnosis not present

## 2022-01-23 DIAGNOSIS — Z85820 Personal history of malignant melanoma of skin: Secondary | ICD-10-CM | POA: Diagnosis not present

## 2022-01-23 DIAGNOSIS — D72819 Decreased white blood cell count, unspecified: Secondary | ICD-10-CM | POA: Diagnosis not present

## 2022-01-23 DIAGNOSIS — Z992 Dependence on renal dialysis: Secondary | ICD-10-CM | POA: Diagnosis not present

## 2022-01-23 DIAGNOSIS — Z08 Encounter for follow-up examination after completed treatment for malignant neoplasm: Secondary | ICD-10-CM | POA: Diagnosis not present

## 2022-01-23 DIAGNOSIS — R911 Solitary pulmonary nodule: Secondary | ICD-10-CM | POA: Diagnosis not present

## 2022-01-23 DIAGNOSIS — D696 Thrombocytopenia, unspecified: Secondary | ICD-10-CM | POA: Diagnosis not present

## 2022-01-23 DIAGNOSIS — J9601 Acute respiratory failure with hypoxia: Secondary | ICD-10-CM | POA: Diagnosis not present

## 2022-01-23 DIAGNOSIS — E039 Hypothyroidism, unspecified: Secondary | ICD-10-CM | POA: Diagnosis not present

## 2022-01-23 DIAGNOSIS — Z7901 Long term (current) use of anticoagulants: Secondary | ICD-10-CM | POA: Diagnosis not present

## 2022-01-23 DIAGNOSIS — C434 Malignant melanoma of scalp and neck: Secondary | ICD-10-CM | POA: Diagnosis not present

## 2022-01-23 DIAGNOSIS — D539 Nutritional anemia, unspecified: Secondary | ICD-10-CM | POA: Diagnosis not present

## 2022-01-23 DIAGNOSIS — K859 Acute pancreatitis without necrosis or infection, unspecified: Secondary | ICD-10-CM | POA: Diagnosis not present

## 2022-01-23 DIAGNOSIS — Z8507 Personal history of malignant neoplasm of pancreas: Secondary | ICD-10-CM | POA: Diagnosis not present

## 2022-01-28 NOTE — Therapy (Signed)
?OUTPATIENT PHYSICAL THERAPY TREATMENT NOTE/DISCHARGE NOTE ? ?PHYSICAL THERAPY DISCHARGE SUMMARY ? ?Visits from Start of Care: 13 ? ?Current functional level related to goals / functional outcomes: ?As indicated below ?  ?Remaining deficits: ?Pt has SLS deficit < 3 seconds but BERG did improve from 46 to 52 during RX ?Pt with some weakness hip flexor but improved from eval ?  ?Education / Equipment: ?HEP, education about community wellness and personal training to continue strengthening post DC  ? ?Patient agrees to discharge. Patient goals were partially met. Patient is being discharged due to being pleased with the current functional level. And desiring to continue exercising at gym and with personal trainer  ? ?  ?Patient Name: Raymond Hurley. ?MRN: [id number] ?DOB:29-Oct-1930, 88 y.o., male ?Today's Date: 01/29/2022 ? ?PCP: Lajean Manes, MD ?REFERRING PROVIDER: Lajean Manes, MD ? ? PT End of Session - 01/29/22 1557   ? ? Visit Number 13   ? Number of Visits 16   ? Date for PT Re-Evaluation 01/31/22   ? Authorization Type MCR A and B / BCBS FOTO on 6th and 10th visit   ? Progress Note Due on Visit 20   ? PT Start Time 1546   ? PT Stop Time 1430   ? PT Time Calculation (min) 1364 min   ? Activity Tolerance Patient tolerated treatment well   ? Behavior During Therapy [facility] for tasks assessed/performed   ? ?  ?  ? ?  ? ? ? ? ? ? ? ? ? ? ? ? ?Past Medical History:  ?Diagnosis Date  ? Acute pancreatitis   ? Atrial fibrillation ([place])   ? CAD (coronary artery disease) 04/2019  ? CHF (congestive heart failure) ([place])   ? Chronic kidney disease   ? Melanoma ([place])   ? Vitamin B 12 deficiency   ? ?Past Surgical History:  ?Procedure Laterality Date  ? CARDIOVERSION N/A 06/02/2019  ? Procedure: CARDIOVERSION;  Surgeon: Jerline Pain, MD;  Location: [facility] ENDOSCOPY;  Service: Cardiovascular;  Laterality: N/A;  ? IR FLUORO GUIDE CV LINE RIGHT  11/06/2020  ? IR US GUIDE VASC ACCESS RIGHT  11/06/2020  ? RIGHT/LEFT HEART CATH AND  CORONARY ANGIOGRAPHY N/A 04/27/2019  ? Procedure: RIGHT/LEFT HEART CATH AND CORONARY ANGIOGRAPHY;  Surgeon: Jolaine Artist, MD;  Location: [place] CV LAB;  Service: Cardiovascular;  Laterality: N/A;  ? TEE WITHOUT CARDIOVERSION N/A 06/02/2019  ? Procedure: TRANSESOPHAGEAL ECHOCARDIOGRAM (TEE);  Surgeon: Jerline Pain, MD;  Location: [facility] ENDOSCOPY;  Service: Cardiovascular;  Laterality: N/A;  ? ?Patient Active Problem List  ? Diagnosis Date Noted  ? Basal cell carcinoma of left side of nose 08/14/2021  ? Chronic pancreatitis ([place]) 02/05/2021  ? Pulmonary embolus ([place]) 02/05/2021  ? Protein-calorie malnutrition, severe 12/06/2020  ? Dysphagia   ? Slow transit constipation   ? Hemodialysis-associated hypotension   ? Dizziness and giddiness   ? Acute on chronic anemia   ? ESRD on dialysis [facility])   ? Palliative care by specialist   ? Debility   ? Thrombocytopenia ([place])   ? Chronic systolic congestive heart failure ([place])   ? Acute respiratory failure ([place])   ? AKI (acute kidney injury) ([place])   ? Dehydration   ? Persistent atrial fibrillation ([place])   ? Severe sepsis ([place]) 10/14/2020  ? Pneumonia due to COVID-19 virus 10/14/2020  ? Chronic systolic CHF (congestive heart failure) ([place]) 10/14/2020  ? Elevated troponin 10/14/2020  ? Facial weakness 10/14/2020  ? Hypocalcemia 10/14/2020  ?  Macrocytic anemia 10/14/2020  ? Acute metabolic encephalopathy 83/15/1761  ? PAF (paroxysmal atrial fibrillation) ([place]) 07/05/2020  ? Anticoagulated 07/05/2020  ? NICM (nonischemic cardiomyopathy) ([place]) 07/05/2020  ? History of melanoma 07/05/2020  ? ? ?REFERRING DIAG: R26.9 (ICD-10-CM) - Unspecified abnormalities of gait and mobility ? ?THERAPY DIAG:  ?Unsteadiness on feet ? ?Stiffness of left hip, not elsewhere classified ? ?Debility ? ?Stiffness of right hip, not elsewhere classified ? ?Muscle weakness (generalized) ? ?Dehydration ? ?PERTINENT HISTORY: Debility, long Hospitalization with sepsis and decubitus ulcer,   thrombocytopenia  Atrial fibrilation. Melanoma,  chronic pancreatitis, ESRD (3 x a week down to 1 x a week) extensive medical hx see medical chart. ? ? ?SUBJECTIVE: Today I had a little pain in my back from doing the deadlifts the other day ? ?PAIN:  ?Are you having pain? no ?NPRS scale:0/10 ?Pain location: back R hip low back pain ?Pain orientation: Bilateral  ?PAIN TYPE: aching ?Pain description: intermittent  ?Aggravating factors: rotation of hips, bending flexing back, walking /standing for longer than 15 min ?Relieving factors: Keeping back stiff, avoiding rotation ? ? ? ? ?OBJECTIVE:  ? *Unless otherwise noted, objective measures collected previously* ? ?DIAGNOSTIC FINDINGS: 11/12/17 ?IMPRESSION: ?1. No acute osseous abnormality or metastatic disease identified in ?the lumbar spine. ?2. Posterior dermis associated 14 mm cyst at the L1 level appears ?benign, such as sebaceous cyst. ?3. Transitional lumbosacral anatomy. Same numbering system as on the ?2007 MRI designating a mostly sacralized L5 level. ?4. Progressed lumbar spine degeneration since 2007. New or ?increased: ?- moderate spinal stenosis at L2-L3 and L3-L4. ?- moderate to severe left L4 neural foraminal stenosis. ?- moderate left L3 neural foraminal stenosis. ?-mild spinal stenosis at T12-L1 and L1-L2. ?  ?PATIENT SURVEYS:  ?FOTO 55% predicted 60% ?12-25-21  63% ?01-10-22 57% ?  ?SCREENING FOR RED FLAGS: ?Bowel or bladder incontinence: No ?Cauda equina syndrome: No ?Compression fracture: No ?  ?  ?COGNITION: ?         Overall cognitive status: Within functional limits for tasks assessed              ?         Pt is HOH ?SENSATION: ?         Light touch: Deficits numb in R ant thigha ?         Stereognosis: Appears intact ?         Hot/Cold: Appears intact ?         Proprioception: Appears intact ?  ?MUSCLE LENGTH: ?Hamstrings: Right 50 deg; Left 50 deg ?Thomas test: bil tightness moderate  01-10-22  right 23, Left 25 ?  ?POSTURE:  ?Flexed trunk posture, PPT forward head  and rounded shoulders ?  ?PALPATION: ?TTP over bil low back lumbar and TTP over R>L ?  ?LUMBARAROM/PROM ?  ?A/PROM A/PROM  ?12/06/2021 A/PROM  ?01/01/2022 A/PROM ?01-10-22 A/PROM ?01-29-22  ?Flexion Finger tip to anterior thigh bil Fingertip to floor Finger tip to floor Fingertip to floor  ?Extension 0 increase with pain '10 10 25  ' ?Right lateral flexion 2 in above knee jt line 1 inch below knee jt line 1 inch below knee jt line 1.5 inch below jt line of knee  ?Left lateral flexion 2 inch above knee jt line 1.5 inch below knee jt line 1.5 inch below knee jt line 1.5 inch below knee jt line  ?Right rotation 50% 25% limited 25% limited  25 %  ?Left rotation 50% 25%limited 25%limited 25%  ? (Blank  rows = not tested) ?  ?LE AROM/PROM: ?  ?A/PROM Right ?12/06/2021 Left ?12/06/2021 Right ?01-10-22 Left ?01-10-22 RIGHT ?01-29-22 Left ?01-29-22  ?Hip flexion 70 76 80 92 95 95  ?Hip extension          ?Hip abduction          ?Hip adduction          ?Hip internal rotation '18 23 21 23 25 26  ' ?Hip external rotation 30 45 30 45 45 45  ?Knee flexion 130 127 130 131 131 134  ?Knee extension -5 -8 -5 -5 -3 -3  ?Ankle dorsiflexion '10 9 11 11 12 11  ' ?Ankle plantarflexion          ?Ankle inversion          ?Ankle eversion          ? (Blank rows = not tested) ?  ?LE MMT: ?  ?MMT Right ?12/06/2021 Left ?12/06/2021 RIGHT ?01-29-22 Left ?01-29-22  ?Hip flexion 3- 4- 4- 4  ?Hip extension 3- 3- 4- 4-  ?Hip abduction 3- 3- 4- 4-  ?Hip adduction        ?Hip internal rotation        ?Hip external rotation        ?Knee flexion 4- 4- 4+ 4+  ?Knee extension 4- 4- 4+ 4+  ?Ankle dorsiflexion        ?Ankle plantarflexion        ?Ankle inversion        ?Ankle eversion        ? (Blank rows = not tested) ?  ?LUMBAR SPECIAL TESTS:  ?Straight leg raise test: Negative, Slump test: Negative, and FABER test: Positive ?  ?FUNCTIONAL TESTS:  ?5 times sit to stand: 19.4 sec ?01-01-22 5 x STS  12.75 sec ?01-29-22 5 x STS 10.60 ?6 minute walk test: TBA ?Berg Balance Scale:  46/56 ?01-01-22 Merrilee Jansky 48/56 ?01-29-22 BERG 52/56 ?  ?GAIT: ?Distance walked: 150 feet ?Assistive device utilized: None ?Level of assistance: Complete Independence ?Gait Velocity   1.99 ft /sec  DC  2.82 ft/se

## 2022-01-29 ENCOUNTER — Encounter: Payer: Self-pay | Admitting: Physical Therapy

## 2022-01-29 ENCOUNTER — Ambulatory Visit: Payer: Medicare Other | Admitting: Physical Therapy

## 2022-01-29 DIAGNOSIS — M25651 Stiffness of right hip, not elsewhere classified: Secondary | ICD-10-CM | POA: Diagnosis not present

## 2022-01-29 DIAGNOSIS — M6281 Muscle weakness (generalized): Secondary | ICD-10-CM

## 2022-01-29 DIAGNOSIS — R2681 Unsteadiness on feet: Secondary | ICD-10-CM | POA: Diagnosis not present

## 2022-01-29 DIAGNOSIS — R5381 Other malaise: Secondary | ICD-10-CM | POA: Diagnosis not present

## 2022-01-29 DIAGNOSIS — E860 Dehydration: Secondary | ICD-10-CM

## 2022-01-29 DIAGNOSIS — M25652 Stiffness of left hip, not elsewhere classified: Secondary | ICD-10-CM | POA: Diagnosis not present

## 2022-01-30 DIAGNOSIS — N186 End stage renal disease: Secondary | ICD-10-CM | POA: Diagnosis not present

## 2022-01-30 DIAGNOSIS — N2581 Secondary hyperparathyroidism of renal origin: Secondary | ICD-10-CM | POA: Diagnosis not present

## 2022-01-30 DIAGNOSIS — D509 Iron deficiency anemia, unspecified: Secondary | ICD-10-CM | POA: Diagnosis not present

## 2022-01-30 DIAGNOSIS — Z992 Dependence on renal dialysis: Secondary | ICD-10-CM | POA: Diagnosis not present

## 2022-01-30 DIAGNOSIS — E079 Disorder of thyroid, unspecified: Secondary | ICD-10-CM | POA: Diagnosis not present

## 2022-02-06 DIAGNOSIS — Z992 Dependence on renal dialysis: Secondary | ICD-10-CM | POA: Diagnosis not present

## 2022-02-06 DIAGNOSIS — D509 Iron deficiency anemia, unspecified: Secondary | ICD-10-CM | POA: Diagnosis not present

## 2022-02-06 DIAGNOSIS — N186 End stage renal disease: Secondary | ICD-10-CM | POA: Diagnosis not present

## 2022-02-06 DIAGNOSIS — N2581 Secondary hyperparathyroidism of renal origin: Secondary | ICD-10-CM | POA: Diagnosis not present

## 2022-02-07 ENCOUNTER — Encounter: Payer: Medicare Other | Admitting: Physical Therapy

## 2022-02-08 ENCOUNTER — Ambulatory Visit: Payer: Medicare Other | Attending: Internal Medicine

## 2022-02-08 DIAGNOSIS — Z23 Encounter for immunization: Secondary | ICD-10-CM

## 2022-02-08 NOTE — Progress Notes (Signed)
? ?  Covid-19 Vaccination Clinic ? ?Name:  Raymond Hurley.    ?MRN: [id number] ?DOB: [birth year] ? ?02/08/2022 ? ?Mr. Karner was observed post Covid-19 immunization for 15 minutes without incident. He was provided with Vaccine Information Sheet and instruction to access the V-Safe system.  ? ?Mr. Edgecombe was instructed to call 911 with any severe reactions post vaccine: ?Difficulty breathing  ?Swelling of face and throat  ?A fast heartbeat  ?A bad rash all over body  ?Dizziness and weakness  ? ?Immunizations Administered   ? ? Name Date Dose VIS Date Route  ? Moderna Covid-19 vaccine Bivalent Booster 02/08/2022  2:33 PM 0.5 mL 05/26/2021 Intramuscular  ? Manufacturer: Moderna  ? Lot: [id number]  ? [place]: 80777-282-99  ? ?  ? ?

## 2022-02-11 DIAGNOSIS — N186 End stage renal disease: Secondary | ICD-10-CM | POA: Diagnosis not present

## 2022-02-11 DIAGNOSIS — N2581 Secondary hyperparathyroidism of renal origin: Secondary | ICD-10-CM | POA: Diagnosis not present

## 2022-02-11 DIAGNOSIS — Z992 Dependence on renal dialysis: Secondary | ICD-10-CM | POA: Diagnosis not present

## 2022-02-11 DIAGNOSIS — I129 Hypertensive chronic kidney disease with stage 1 through stage 4 chronic kidney disease, or unspecified chronic kidney disease: Secondary | ICD-10-CM | POA: Diagnosis not present

## 2022-02-11 DIAGNOSIS — D509 Iron deficiency anemia, unspecified: Secondary | ICD-10-CM | POA: Diagnosis not present

## 2022-02-12 ENCOUNTER — Encounter: Payer: Medicare Other | Admitting: Physical Therapy

## 2022-02-12 DIAGNOSIS — C44329 Squamous cell carcinoma of skin of other parts of face: Secondary | ICD-10-CM | POA: Diagnosis not present

## 2022-02-12 DIAGNOSIS — Z85820 Personal history of malignant melanoma of skin: Secondary | ICD-10-CM | POA: Diagnosis not present

## 2022-02-12 DIAGNOSIS — Z85828 Personal history of other malignant neoplasm of skin: Secondary | ICD-10-CM | POA: Diagnosis not present

## 2022-02-13 DIAGNOSIS — Z992 Dependence on renal dialysis: Secondary | ICD-10-CM | POA: Diagnosis not present

## 2022-02-13 DIAGNOSIS — N2581 Secondary hyperparathyroidism of renal origin: Secondary | ICD-10-CM | POA: Diagnosis not present

## 2022-02-13 DIAGNOSIS — N186 End stage renal disease: Secondary | ICD-10-CM | POA: Diagnosis not present

## 2022-02-13 DIAGNOSIS — D509 Iron deficiency anemia, unspecified: Secondary | ICD-10-CM | POA: Diagnosis not present

## 2022-02-14 DIAGNOSIS — L570 Actinic keratosis: Secondary | ICD-10-CM | POA: Diagnosis not present

## 2022-02-14 DIAGNOSIS — C44629 Squamous cell carcinoma of skin of left upper limb, including shoulder: Secondary | ICD-10-CM | POA: Diagnosis not present

## 2022-02-14 DIAGNOSIS — C4441 Basal cell carcinoma of skin of scalp and neck: Secondary | ICD-10-CM | POA: Diagnosis not present

## 2022-02-14 DIAGNOSIS — C44529 Squamous cell carcinoma of skin of other part of trunk: Secondary | ICD-10-CM | POA: Diagnosis not present

## 2022-02-14 DIAGNOSIS — L821 Other seborrheic keratosis: Secondary | ICD-10-CM | POA: Diagnosis not present

## 2022-02-14 DIAGNOSIS — L820 Inflamed seborrheic keratosis: Secondary | ICD-10-CM | POA: Diagnosis not present

## 2022-02-14 DIAGNOSIS — D1801 Hemangioma of skin and subcutaneous tissue: Secondary | ICD-10-CM | POA: Diagnosis not present

## 2022-02-14 DIAGNOSIS — Z85828 Personal history of other malignant neoplasm of skin: Secondary | ICD-10-CM | POA: Diagnosis not present

## 2022-02-14 DIAGNOSIS — Z85820 Personal history of malignant melanoma of skin: Secondary | ICD-10-CM | POA: Diagnosis not present

## 2022-02-14 DIAGNOSIS — D0462 Carcinoma in situ of skin of left upper limb, including shoulder: Secondary | ICD-10-CM | POA: Diagnosis not present

## 2022-02-14 DIAGNOSIS — D485 Neoplasm of uncertain behavior of skin: Secondary | ICD-10-CM | POA: Diagnosis not present

## 2022-02-19 ENCOUNTER — Encounter: Payer: Medicare Other | Admitting: Physical Therapy

## 2022-02-20 DIAGNOSIS — N2581 Secondary hyperparathyroidism of renal origin: Secondary | ICD-10-CM | POA: Diagnosis not present

## 2022-02-20 DIAGNOSIS — D509 Iron deficiency anemia, unspecified: Secondary | ICD-10-CM | POA: Diagnosis not present

## 2022-02-20 DIAGNOSIS — Z992 Dependence on renal dialysis: Secondary | ICD-10-CM | POA: Diagnosis not present

## 2022-02-20 DIAGNOSIS — N186 End stage renal disease: Secondary | ICD-10-CM | POA: Diagnosis not present

## 2022-02-26 ENCOUNTER — Encounter: Payer: Medicare Other | Admitting: Physical Therapy

## 2022-02-26 DIAGNOSIS — C799 Secondary malignant neoplasm of unspecified site: Secondary | ICD-10-CM | POA: Diagnosis not present

## 2022-02-26 DIAGNOSIS — Z992 Dependence on renal dialysis: Secondary | ICD-10-CM | POA: Diagnosis not present

## 2022-02-26 DIAGNOSIS — I5022 Chronic systolic (congestive) heart failure: Secondary | ICD-10-CM | POA: Diagnosis not present

## 2022-02-26 DIAGNOSIS — Z0000 Encounter for general adult medical examination without abnormal findings: Secondary | ICD-10-CM | POA: Diagnosis not present

## 2022-02-26 DIAGNOSIS — I483 Typical atrial flutter: Secondary | ICD-10-CM | POA: Diagnosis not present

## 2022-02-26 DIAGNOSIS — K429 Umbilical hernia without obstruction or gangrene: Secondary | ICD-10-CM | POA: Diagnosis not present

## 2022-02-26 DIAGNOSIS — E039 Hypothyroidism, unspecified: Secondary | ICD-10-CM | POA: Diagnosis not present

## 2022-02-26 DIAGNOSIS — N186 End stage renal disease: Secondary | ICD-10-CM | POA: Diagnosis not present

## 2022-02-26 DIAGNOSIS — D631 Anemia in chronic kidney disease: Secondary | ICD-10-CM | POA: Diagnosis not present

## 2022-02-26 DIAGNOSIS — I700 Atherosclerosis of aorta: Secondary | ICD-10-CM | POA: Diagnosis not present

## 2022-02-26 DIAGNOSIS — D7589 Other specified diseases of blood and blood-forming organs: Secondary | ICD-10-CM | POA: Diagnosis not present

## 2022-02-26 DIAGNOSIS — E538 Deficiency of other specified B group vitamins: Secondary | ICD-10-CM | POA: Diagnosis not present

## 2022-02-27 DIAGNOSIS — N186 End stage renal disease: Secondary | ICD-10-CM | POA: Diagnosis not present

## 2022-02-27 DIAGNOSIS — D509 Iron deficiency anemia, unspecified: Secondary | ICD-10-CM | POA: Diagnosis not present

## 2022-02-27 DIAGNOSIS — Z992 Dependence on renal dialysis: Secondary | ICD-10-CM | POA: Diagnosis not present

## 2022-02-27 DIAGNOSIS — N2581 Secondary hyperparathyroidism of renal origin: Secondary | ICD-10-CM | POA: Diagnosis not present

## 2022-02-27 DIAGNOSIS — E079 Disorder of thyroid, unspecified: Secondary | ICD-10-CM | POA: Diagnosis not present

## 2022-03-06 DIAGNOSIS — N186 End stage renal disease: Secondary | ICD-10-CM | POA: Diagnosis not present

## 2022-03-06 DIAGNOSIS — Z992 Dependence on renal dialysis: Secondary | ICD-10-CM | POA: Diagnosis not present

## 2022-03-06 DIAGNOSIS — D509 Iron deficiency anemia, unspecified: Secondary | ICD-10-CM | POA: Diagnosis not present

## 2022-03-06 DIAGNOSIS — N2581 Secondary hyperparathyroidism of renal origin: Secondary | ICD-10-CM | POA: Diagnosis not present

## 2022-03-12 DIAGNOSIS — I480 Paroxysmal atrial fibrillation: Secondary | ICD-10-CM | POA: Diagnosis not present

## 2022-03-12 DIAGNOSIS — R7989 Other specified abnormal findings of blood chemistry: Secondary | ICD-10-CM | POA: Diagnosis not present

## 2022-03-12 LAB — TSH+FREE T4
Free T4: 1.33 ng/dL (ref 0.82–1.77)
TSH: 16.2 u[IU]/mL — ABNORMAL HIGH (ref 0.450–4.500)

## 2022-03-13 DIAGNOSIS — Z992 Dependence on renal dialysis: Secondary | ICD-10-CM | POA: Diagnosis not present

## 2022-03-13 DIAGNOSIS — N2581 Secondary hyperparathyroidism of renal origin: Secondary | ICD-10-CM | POA: Diagnosis not present

## 2022-03-13 DIAGNOSIS — N186 End stage renal disease: Secondary | ICD-10-CM | POA: Diagnosis not present

## 2022-03-13 DIAGNOSIS — D509 Iron deficiency anemia, unspecified: Secondary | ICD-10-CM | POA: Diagnosis not present

## 2022-03-14 ENCOUNTER — Other Ambulatory Visit: Payer: Self-pay | Admitting: *Deleted

## 2022-03-14 ENCOUNTER — Encounter: Payer: Self-pay | Admitting: *Deleted

## 2022-03-14 DIAGNOSIS — I129 Hypertensive chronic kidney disease with stage 1 through stage 4 chronic kidney disease, or unspecified chronic kidney disease: Secondary | ICD-10-CM | POA: Diagnosis not present

## 2022-03-14 DIAGNOSIS — Z992 Dependence on renal dialysis: Secondary | ICD-10-CM | POA: Diagnosis not present

## 2022-03-14 DIAGNOSIS — E039 Hypothyroidism, unspecified: Secondary | ICD-10-CM

## 2022-03-14 DIAGNOSIS — N186 End stage renal disease: Secondary | ICD-10-CM | POA: Diagnosis not present

## 2022-03-20 DIAGNOSIS — D509 Iron deficiency anemia, unspecified: Secondary | ICD-10-CM | POA: Diagnosis not present

## 2022-03-20 DIAGNOSIS — N186 End stage renal disease: Secondary | ICD-10-CM | POA: Diagnosis not present

## 2022-03-20 DIAGNOSIS — N2581 Secondary hyperparathyroidism of renal origin: Secondary | ICD-10-CM | POA: Diagnosis not present

## 2022-03-20 DIAGNOSIS — Z992 Dependence on renal dialysis: Secondary | ICD-10-CM | POA: Diagnosis not present

## 2022-03-29 DIAGNOSIS — D509 Iron deficiency anemia, unspecified: Secondary | ICD-10-CM | POA: Diagnosis not present

## 2022-03-29 DIAGNOSIS — N186 End stage renal disease: Secondary | ICD-10-CM | POA: Diagnosis not present

## 2022-03-29 DIAGNOSIS — N2581 Secondary hyperparathyroidism of renal origin: Secondary | ICD-10-CM | POA: Diagnosis not present

## 2022-03-29 DIAGNOSIS — Z992 Dependence on renal dialysis: Secondary | ICD-10-CM | POA: Diagnosis not present

## 2022-04-08 DIAGNOSIS — Z992 Dependence on renal dialysis: Secondary | ICD-10-CM | POA: Diagnosis not present

## 2022-04-08 DIAGNOSIS — D509 Iron deficiency anemia, unspecified: Secondary | ICD-10-CM | POA: Diagnosis not present

## 2022-04-08 DIAGNOSIS — E039 Hypothyroidism, unspecified: Secondary | ICD-10-CM | POA: Diagnosis not present

## 2022-04-08 DIAGNOSIS — N186 End stage renal disease: Secondary | ICD-10-CM | POA: Diagnosis not present

## 2022-04-08 DIAGNOSIS — N2581 Secondary hyperparathyroidism of renal origin: Secondary | ICD-10-CM | POA: Diagnosis not present

## 2022-04-10 DIAGNOSIS — H401131 Primary open-angle glaucoma, bilateral, mild stage: Secondary | ICD-10-CM | POA: Diagnosis not present

## 2022-04-10 DIAGNOSIS — H43813 Vitreous degeneration, bilateral: Secondary | ICD-10-CM | POA: Diagnosis not present

## 2022-04-10 DIAGNOSIS — H0102A Squamous blepharitis right eye, upper and lower eyelids: Secondary | ICD-10-CM | POA: Diagnosis not present

## 2022-04-10 DIAGNOSIS — H0102B Squamous blepharitis left eye, upper and lower eyelids: Secondary | ICD-10-CM | POA: Diagnosis not present

## 2022-04-10 DIAGNOSIS — H04123 Dry eye syndrome of bilateral lacrimal glands: Secondary | ICD-10-CM | POA: Diagnosis not present

## 2022-04-10 DIAGNOSIS — H2513 Age-related nuclear cataract, bilateral: Secondary | ICD-10-CM | POA: Diagnosis not present

## 2022-04-10 DIAGNOSIS — D3131 Benign neoplasm of right choroid: Secondary | ICD-10-CM | POA: Diagnosis not present

## 2022-04-13 DIAGNOSIS — N186 End stage renal disease: Secondary | ICD-10-CM | POA: Diagnosis not present

## 2022-04-13 DIAGNOSIS — I129 Hypertensive chronic kidney disease with stage 1 through stage 4 chronic kidney disease, or unspecified chronic kidney disease: Secondary | ICD-10-CM | POA: Diagnosis not present

## 2022-04-13 DIAGNOSIS — Z992 Dependence on renal dialysis: Secondary | ICD-10-CM | POA: Diagnosis not present

## 2022-04-14 IMAGING — CT CT ABD-PELV W/O CM
2 of 4 series · 16 of 46 positions shown, 18 images · non-contrast
Comparison: None.

CLINICAL DATA: Acute generalized abdominal pain.

EXAM:
CT ABDOMEN AND PELVIS WITHOUT CONTRAST
TECHNIQUE: Multidetector CT imaging of the abdomen and pelvis was performed
following the standard protocol without IV contrast.

[Series 3: abd/ pelvis 5.0 i30f 2 · axial · 0.75mm/px · z∈[-492,-52]mm · 13 of 97 slices shown, 15 images]
[im 5/97  soft-tissue]
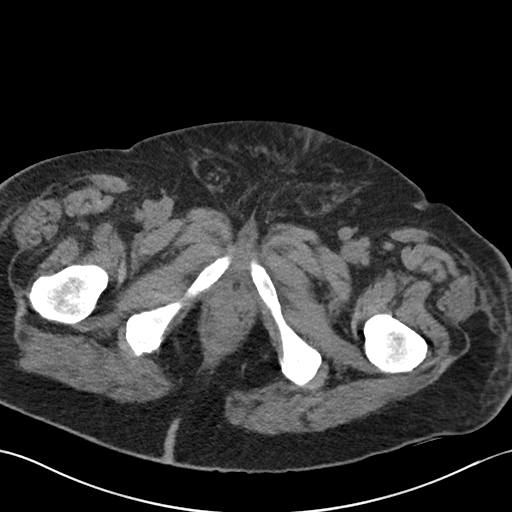
[im 5/97  bone]
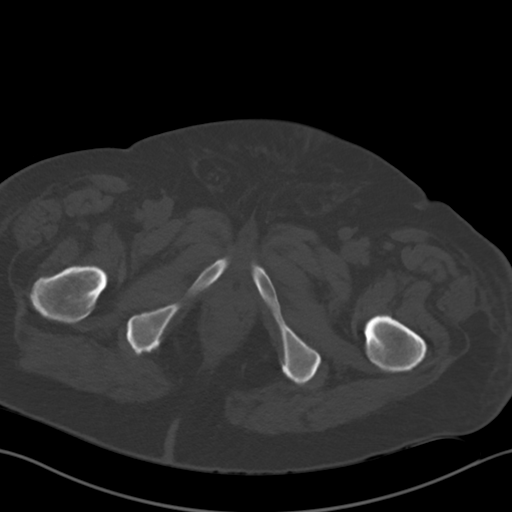
[im 13/97  soft-tissue]
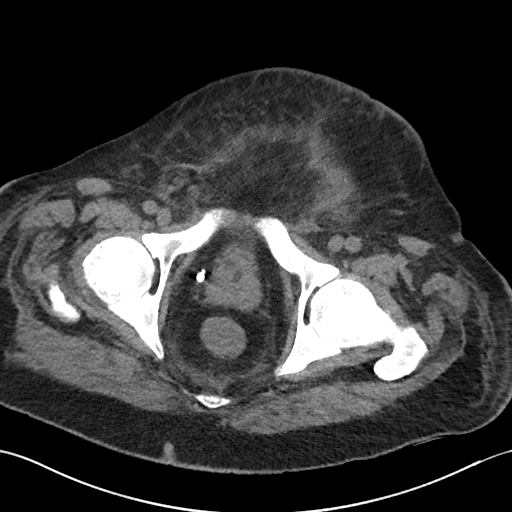
[im 21/97  soft-tissue]
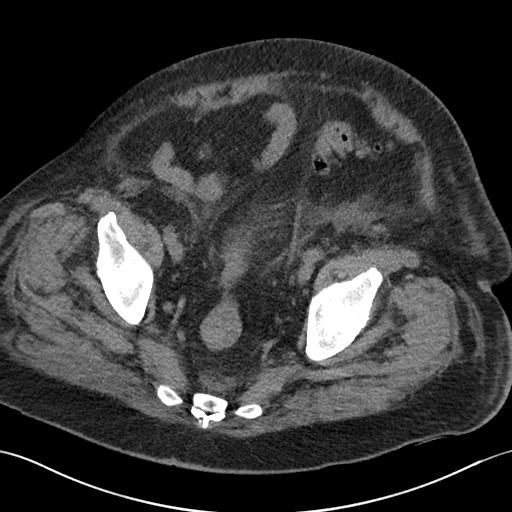
[im 29/97  soft-tissue]
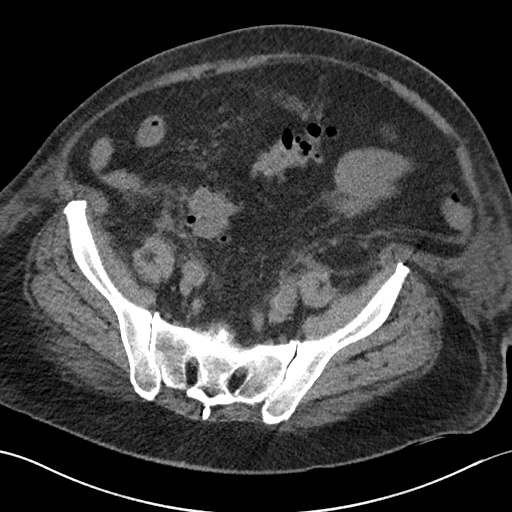
[im 33/97  soft-tissue]
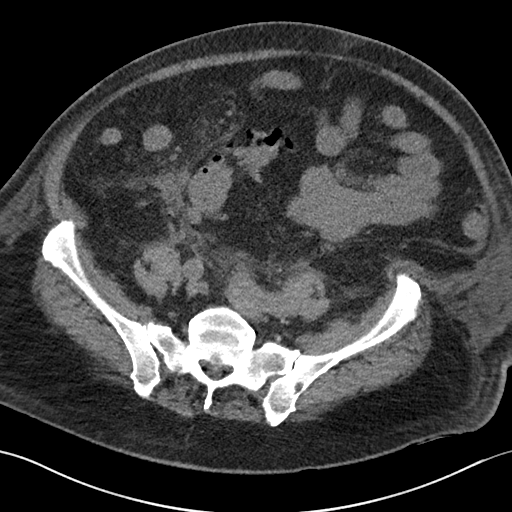
[im 41/97  soft-tissue]
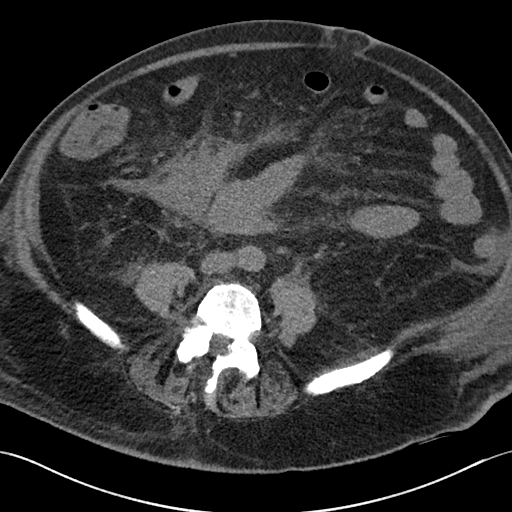
[im 49/97  soft-tissue]
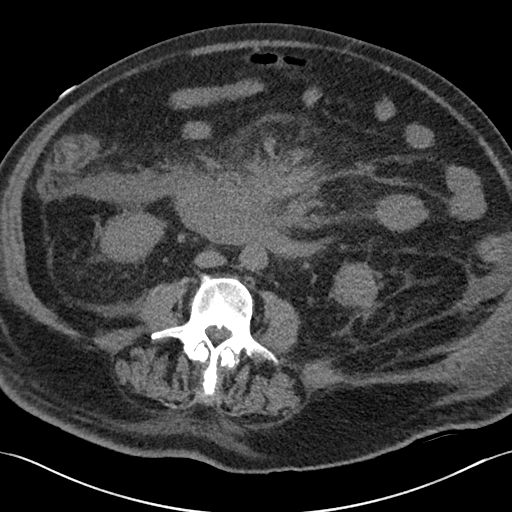
[im 57/97  soft-tissue]
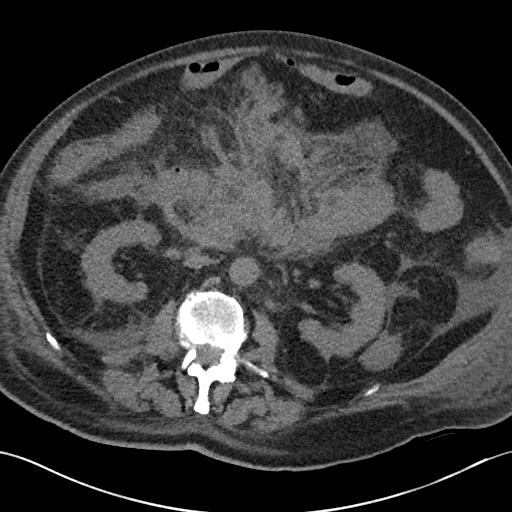
[im 65/97  soft-tissue]
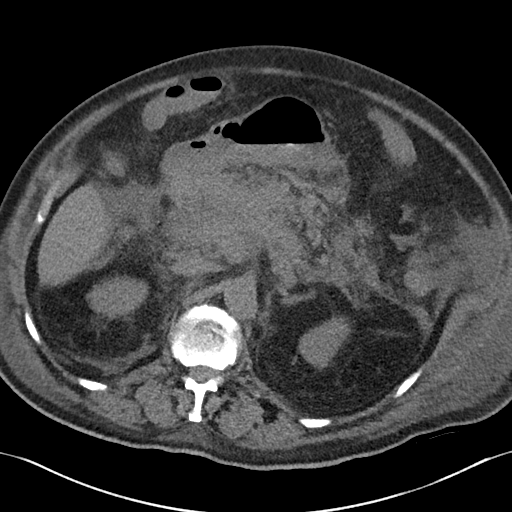
[im 65/97  bone]
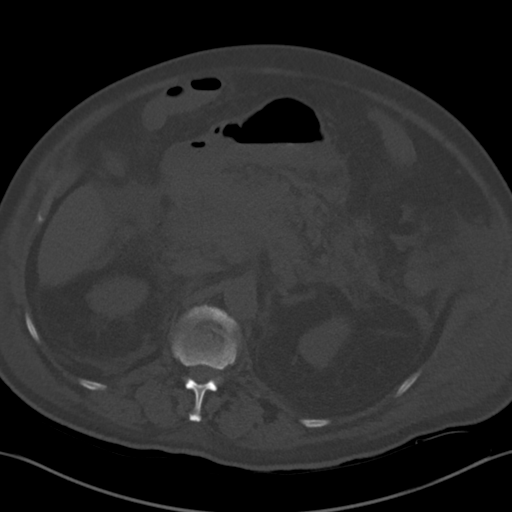
[im 69/97  soft-tissue]
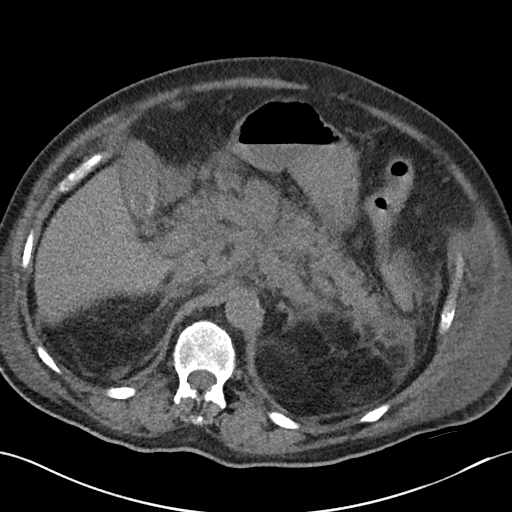
[im 77/97  soft-tissue]
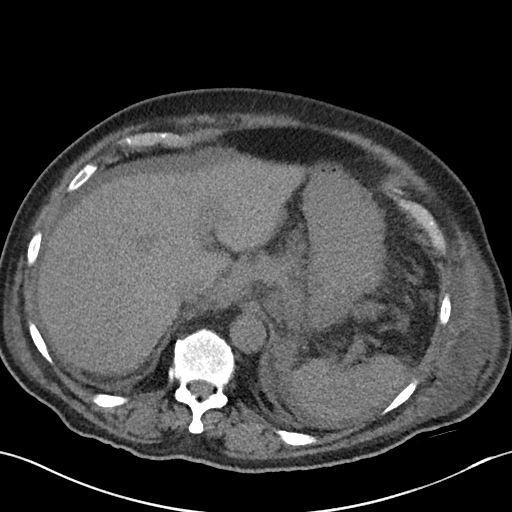
[im 85/97  soft-tissue]
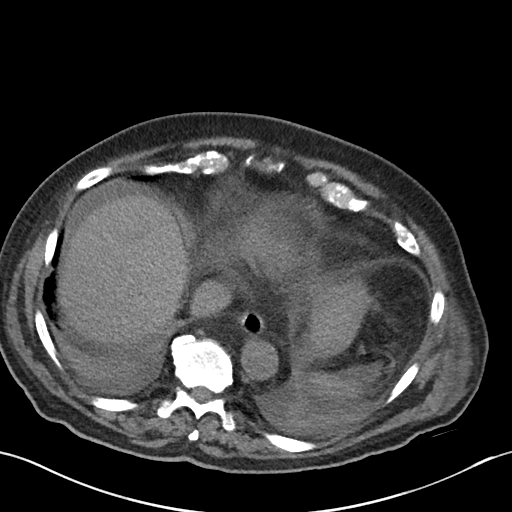
[im 93/97  soft-tissue]
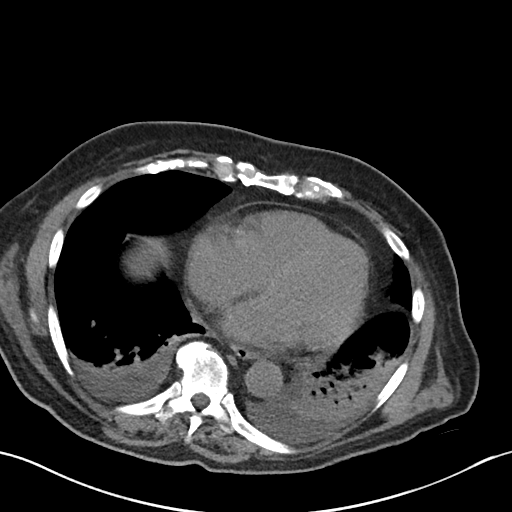

[Series 6: cor st · coronal · 0.93mm/px · 3 of 124 slices shown]
[im 42/124  soft-tissue]
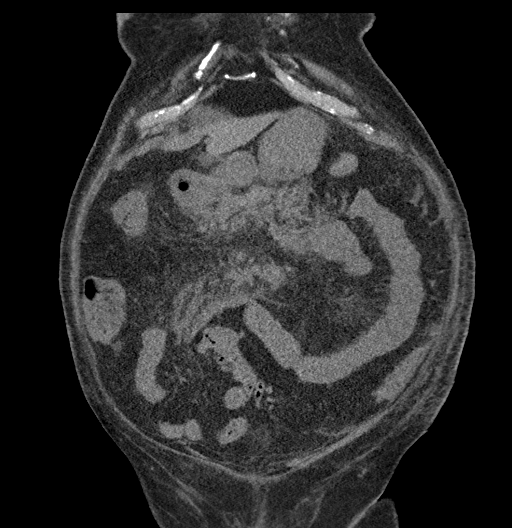
[im 55/124  soft-tissue]
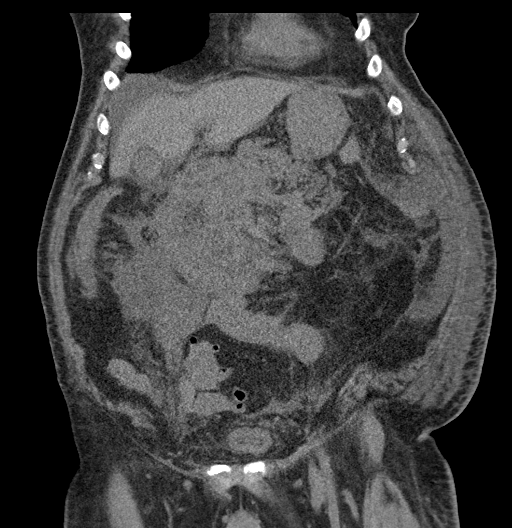
[im 69/124  soft-tissue]
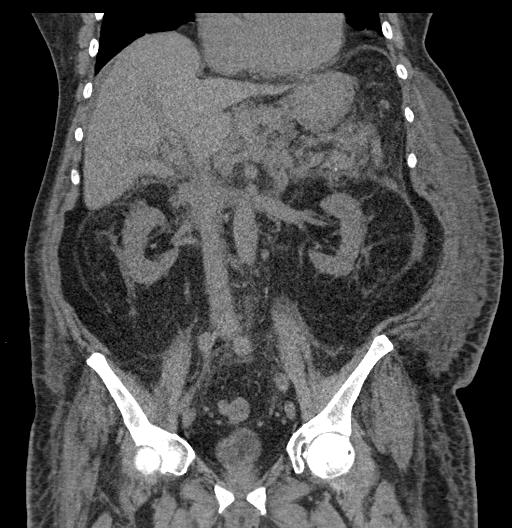

[16 of 46 positions shown; findings below may reference images not displayed]

FINDINGS: Lower chest: Mild bilateral pleural effusions are noted with
adjacent subsegmental atelectasis.

Hepatobiliary: Solitary gallstone is noted. No biliary dilatation is
noted. Mild amount of fluid is noted around the liver. No focal
abnormality is noted in the liver.

Pancreas: Large amount of inflammatory changes are noted involving
the pancreas consistent with severe acute pancreatitis. Fluid is
seen extending into both pericolic gutters. No definite pseudocyst
formation is seen at this time.

Spleen: Mild amount of fluid is noted around the spleen.

Adrenals/Urinary Tract: Adrenal glands appear normal. Left renal
cyst is noted. No hydronephrosis or renal obstruction is noted. No
renal or ureteral calculi are noted. Urinary bladder is decompressed
secondary to Foley catheter.

Stomach/Bowel: Stomach appears normal. There is no evidence of bowel
obstruction. Sigmoid diverticulosis is noted without evidence of
diverticulitis.

Vascular/Lymphatic: Aortic atherosclerosis. No enlarged abdominal or
pelvic lymph nodes.

Reproductive: Prostate is unremarkable.

Other: No abdominal wall hernia or abnormality. No abdominopelvic
ascites.

Musculoskeletal: No acute or significant osseous findings.
IMPRESSION: 1. Large amount of inflammatory changes are noted involving the
pancreas consistent with severe acute pancreatitis. Fluid is seen
extending into both pericolic gutters as well as around the liver
and spleen. No definite pseudocyst formation is seen at this time.
2. Solitary gallstone is noted.
3. Sigmoid diverticulosis without evidence of diverticulitis.
4. Mild bilateral pleural effusions are noted with adjacent
subsegmental atelectasis.
5. Aortic atherosclerosis.

Aortic Atherosclerosis ([id number]).

## 2022-04-17 DIAGNOSIS — Z992 Dependence on renal dialysis: Secondary | ICD-10-CM | POA: Diagnosis not present

## 2022-04-17 DIAGNOSIS — N2581 Secondary hyperparathyroidism of renal origin: Secondary | ICD-10-CM | POA: Diagnosis not present

## 2022-04-17 DIAGNOSIS — D509 Iron deficiency anemia, unspecified: Secondary | ICD-10-CM | POA: Diagnosis not present

## 2022-04-17 DIAGNOSIS — N186 End stage renal disease: Secondary | ICD-10-CM | POA: Diagnosis not present

## 2022-04-24 DIAGNOSIS — Z992 Dependence on renal dialysis: Secondary | ICD-10-CM | POA: Diagnosis not present

## 2022-04-24 DIAGNOSIS — N186 End stage renal disease: Secondary | ICD-10-CM | POA: Diagnosis not present

## 2022-04-24 DIAGNOSIS — D509 Iron deficiency anemia, unspecified: Secondary | ICD-10-CM | POA: Diagnosis not present

## 2022-04-24 DIAGNOSIS — N2581 Secondary hyperparathyroidism of renal origin: Secondary | ICD-10-CM | POA: Diagnosis not present

## 2022-04-25 DIAGNOSIS — Z85828 Personal history of other malignant neoplasm of skin: Secondary | ICD-10-CM | POA: Diagnosis not present

## 2022-04-25 DIAGNOSIS — C44622 Squamous cell carcinoma of skin of right upper limb, including shoulder: Secondary | ICD-10-CM | POA: Diagnosis not present

## 2022-04-25 DIAGNOSIS — D485 Neoplasm of uncertain behavior of skin: Secondary | ICD-10-CM | POA: Diagnosis not present

## 2022-04-25 DIAGNOSIS — D1801 Hemangioma of skin and subcutaneous tissue: Secondary | ICD-10-CM | POA: Diagnosis not present

## 2022-04-25 DIAGNOSIS — Z85820 Personal history of malignant melanoma of skin: Secondary | ICD-10-CM | POA: Diagnosis not present

## 2022-04-25 DIAGNOSIS — L570 Actinic keratosis: Secondary | ICD-10-CM | POA: Diagnosis not present

## 2022-04-25 DIAGNOSIS — L821 Other seborrheic keratosis: Secondary | ICD-10-CM | POA: Diagnosis not present

## 2022-04-25 DIAGNOSIS — L812 Freckles: Secondary | ICD-10-CM | POA: Diagnosis not present

## 2022-05-01 DIAGNOSIS — D509 Iron deficiency anemia, unspecified: Secondary | ICD-10-CM | POA: Diagnosis not present

## 2022-05-01 DIAGNOSIS — Z992 Dependence on renal dialysis: Secondary | ICD-10-CM | POA: Diagnosis not present

## 2022-05-01 DIAGNOSIS — N186 End stage renal disease: Secondary | ICD-10-CM | POA: Diagnosis not present

## 2022-05-01 DIAGNOSIS — N2581 Secondary hyperparathyroidism of renal origin: Secondary | ICD-10-CM | POA: Diagnosis not present

## 2022-05-01 DIAGNOSIS — E039 Hypothyroidism, unspecified: Secondary | ICD-10-CM | POA: Diagnosis not present

## 2022-05-01 DIAGNOSIS — E079 Disorder of thyroid, unspecified: Secondary | ICD-10-CM | POA: Diagnosis not present

## 2022-05-08 DIAGNOSIS — N186 End stage renal disease: Secondary | ICD-10-CM | POA: Diagnosis not present

## 2022-05-08 DIAGNOSIS — Z992 Dependence on renal dialysis: Secondary | ICD-10-CM | POA: Diagnosis not present

## 2022-05-08 DIAGNOSIS — N2581 Secondary hyperparathyroidism of renal origin: Secondary | ICD-10-CM | POA: Diagnosis not present

## 2022-05-08 DIAGNOSIS — D509 Iron deficiency anemia, unspecified: Secondary | ICD-10-CM | POA: Diagnosis not present

## 2022-05-14 DIAGNOSIS — Z992 Dependence on renal dialysis: Secondary | ICD-10-CM | POA: Diagnosis not present

## 2022-05-14 DIAGNOSIS — I129 Hypertensive chronic kidney disease with stage 1 through stage 4 chronic kidney disease, or unspecified chronic kidney disease: Secondary | ICD-10-CM | POA: Diagnosis not present

## 2022-05-14 DIAGNOSIS — N186 End stage renal disease: Secondary | ICD-10-CM | POA: Diagnosis not present

## 2022-05-15 DIAGNOSIS — N186 End stage renal disease: Secondary | ICD-10-CM | POA: Diagnosis not present

## 2022-05-15 DIAGNOSIS — N2581 Secondary hyperparathyroidism of renal origin: Secondary | ICD-10-CM | POA: Diagnosis not present

## 2022-05-15 DIAGNOSIS — D631 Anemia in chronic kidney disease: Secondary | ICD-10-CM | POA: Diagnosis not present

## 2022-05-15 DIAGNOSIS — Z992 Dependence on renal dialysis: Secondary | ICD-10-CM | POA: Diagnosis not present

## 2022-05-24 DIAGNOSIS — N2581 Secondary hyperparathyroidism of renal origin: Secondary | ICD-10-CM | POA: Diagnosis not present

## 2022-05-24 DIAGNOSIS — N186 End stage renal disease: Secondary | ICD-10-CM | POA: Diagnosis not present

## 2022-05-24 DIAGNOSIS — D631 Anemia in chronic kidney disease: Secondary | ICD-10-CM | POA: Diagnosis not present

## 2022-05-24 DIAGNOSIS — Z992 Dependence on renal dialysis: Secondary | ICD-10-CM | POA: Diagnosis not present

## 2022-06-05 DIAGNOSIS — R55 Syncope and collapse: Secondary | ICD-10-CM | POA: Diagnosis not present

## 2022-06-05 DIAGNOSIS — N2581 Secondary hyperparathyroidism of renal origin: Secondary | ICD-10-CM | POA: Diagnosis not present

## 2022-06-05 DIAGNOSIS — D631 Anemia in chronic kidney disease: Secondary | ICD-10-CM | POA: Diagnosis not present

## 2022-06-05 DIAGNOSIS — N186 End stage renal disease: Secondary | ICD-10-CM | POA: Diagnosis not present

## 2022-06-05 DIAGNOSIS — R42 Dizziness and giddiness: Secondary | ICD-10-CM | POA: Diagnosis not present

## 2022-06-05 DIAGNOSIS — Z992 Dependence on renal dialysis: Secondary | ICD-10-CM | POA: Diagnosis not present

## 2022-06-12 DIAGNOSIS — Z992 Dependence on renal dialysis: Secondary | ICD-10-CM | POA: Diagnosis not present

## 2022-06-12 DIAGNOSIS — N2581 Secondary hyperparathyroidism of renal origin: Secondary | ICD-10-CM | POA: Diagnosis not present

## 2022-06-12 DIAGNOSIS — N186 End stage renal disease: Secondary | ICD-10-CM | POA: Diagnosis not present

## 2022-06-12 DIAGNOSIS — D631 Anemia in chronic kidney disease: Secondary | ICD-10-CM | POA: Diagnosis not present

## 2022-06-14 DIAGNOSIS — I129 Hypertensive chronic kidney disease with stage 1 through stage 4 chronic kidney disease, or unspecified chronic kidney disease: Secondary | ICD-10-CM | POA: Diagnosis not present

## 2022-06-14 DIAGNOSIS — Z992 Dependence on renal dialysis: Secondary | ICD-10-CM | POA: Diagnosis not present

## 2022-06-14 DIAGNOSIS — N186 End stage renal disease: Secondary | ICD-10-CM | POA: Diagnosis not present

## 2022-06-19 DIAGNOSIS — N2581 Secondary hyperparathyroidism of renal origin: Secondary | ICD-10-CM | POA: Diagnosis not present

## 2022-06-19 DIAGNOSIS — D509 Iron deficiency anemia, unspecified: Secondary | ICD-10-CM | POA: Diagnosis not present

## 2022-06-19 DIAGNOSIS — N186 End stage renal disease: Secondary | ICD-10-CM | POA: Diagnosis not present

## 2022-06-19 DIAGNOSIS — D631 Anemia in chronic kidney disease: Secondary | ICD-10-CM | POA: Diagnosis not present

## 2022-06-19 DIAGNOSIS — Z992 Dependence on renal dialysis: Secondary | ICD-10-CM | POA: Diagnosis not present

## 2022-06-26 DIAGNOSIS — Z992 Dependence on renal dialysis: Secondary | ICD-10-CM | POA: Diagnosis not present

## 2022-06-26 DIAGNOSIS — D509 Iron deficiency anemia, unspecified: Secondary | ICD-10-CM | POA: Diagnosis not present

## 2022-06-26 DIAGNOSIS — D631 Anemia in chronic kidney disease: Secondary | ICD-10-CM | POA: Diagnosis not present

## 2022-06-26 DIAGNOSIS — N2581 Secondary hyperparathyroidism of renal origin: Secondary | ICD-10-CM | POA: Diagnosis not present

## 2022-06-26 DIAGNOSIS — N186 End stage renal disease: Secondary | ICD-10-CM | POA: Diagnosis not present

## 2022-06-27 ENCOUNTER — Other Ambulatory Visit (HOSPITAL_BASED_OUTPATIENT_CLINIC_OR_DEPARTMENT_OTHER): Payer: Self-pay

## 2022-06-27 DIAGNOSIS — Z23 Encounter for immunization: Secondary | ICD-10-CM | POA: Diagnosis not present

## 2022-06-27 MED ORDER — INFLUENZA VAC A&B SA ADJ QUAD 0.5 ML IM PRSY
PREFILLED_SYRINGE | INTRAMUSCULAR | 0 refills | Status: DC
  Filled 2022-06-27: qty 0.5, 1d supply, fill #0

## 2022-07-01 DIAGNOSIS — R051 Acute cough: Secondary | ICD-10-CM | POA: Diagnosis not present

## 2022-07-03 DIAGNOSIS — N186 End stage renal disease: Secondary | ICD-10-CM | POA: Diagnosis not present

## 2022-07-03 DIAGNOSIS — Z992 Dependence on renal dialysis: Secondary | ICD-10-CM | POA: Diagnosis not present

## 2022-07-03 DIAGNOSIS — D631 Anemia in chronic kidney disease: Secondary | ICD-10-CM | POA: Diagnosis not present

## 2022-07-03 DIAGNOSIS — N2581 Secondary hyperparathyroidism of renal origin: Secondary | ICD-10-CM | POA: Diagnosis not present

## 2022-07-03 DIAGNOSIS — E079 Disorder of thyroid, unspecified: Secondary | ICD-10-CM | POA: Diagnosis not present

## 2022-07-03 DIAGNOSIS — D509 Iron deficiency anemia, unspecified: Secondary | ICD-10-CM | POA: Diagnosis not present

## 2022-07-08 ENCOUNTER — Other Ambulatory Visit (HOSPITAL_BASED_OUTPATIENT_CLINIC_OR_DEPARTMENT_OTHER): Payer: Self-pay

## 2022-07-08 DIAGNOSIS — D485 Neoplasm of uncertain behavior of skin: Secondary | ICD-10-CM | POA: Diagnosis not present

## 2022-07-08 DIAGNOSIS — Z85820 Personal history of malignant melanoma of skin: Secondary | ICD-10-CM | POA: Diagnosis not present

## 2022-07-08 DIAGNOSIS — C4442 Squamous cell carcinoma of skin of scalp and neck: Secondary | ICD-10-CM | POA: Diagnosis not present

## 2022-07-08 DIAGNOSIS — Z85828 Personal history of other malignant neoplasm of skin: Secondary | ICD-10-CM | POA: Diagnosis not present

## 2022-07-08 DIAGNOSIS — N185 Chronic kidney disease, stage 5: Secondary | ICD-10-CM | POA: Diagnosis not present

## 2022-07-08 DIAGNOSIS — C44622 Squamous cell carcinoma of skin of right upper limb, including shoulder: Secondary | ICD-10-CM | POA: Diagnosis not present

## 2022-07-08 MED ORDER — AREXVY 120 MCG/0.5ML IM SUSR
INTRAMUSCULAR | 0 refills | Status: DC
  Filled 2022-07-08: qty 0.5, 1d supply, fill #0

## 2022-07-14 DIAGNOSIS — Z992 Dependence on renal dialysis: Secondary | ICD-10-CM | POA: Diagnosis not present

## 2022-07-14 DIAGNOSIS — N186 End stage renal disease: Secondary | ICD-10-CM | POA: Diagnosis not present

## 2022-07-14 DIAGNOSIS — I129 Hypertensive chronic kidney disease with stage 1 through stage 4 chronic kidney disease, or unspecified chronic kidney disease: Secondary | ICD-10-CM | POA: Diagnosis not present

## 2022-07-15 DIAGNOSIS — N185 Chronic kidney disease, stage 5: Secondary | ICD-10-CM | POA: Diagnosis not present

## 2022-07-17 DIAGNOSIS — N2581 Secondary hyperparathyroidism of renal origin: Secondary | ICD-10-CM | POA: Diagnosis not present

## 2022-07-17 DIAGNOSIS — D509 Iron deficiency anemia, unspecified: Secondary | ICD-10-CM | POA: Diagnosis not present

## 2022-07-17 DIAGNOSIS — N186 End stage renal disease: Secondary | ICD-10-CM | POA: Diagnosis not present

## 2022-07-17 DIAGNOSIS — Z992 Dependence on renal dialysis: Secondary | ICD-10-CM | POA: Diagnosis not present

## 2022-07-17 DIAGNOSIS — D631 Anemia in chronic kidney disease: Secondary | ICD-10-CM | POA: Diagnosis not present

## 2022-07-18 DIAGNOSIS — Z85828 Personal history of other malignant neoplasm of skin: Secondary | ICD-10-CM | POA: Diagnosis not present

## 2022-07-18 DIAGNOSIS — C44622 Squamous cell carcinoma of skin of right upper limb, including shoulder: Secondary | ICD-10-CM | POA: Diagnosis not present

## 2022-07-18 DIAGNOSIS — Z85820 Personal history of malignant melanoma of skin: Secondary | ICD-10-CM | POA: Diagnosis not present

## 2022-07-24 DIAGNOSIS — D631 Anemia in chronic kidney disease: Secondary | ICD-10-CM | POA: Diagnosis not present

## 2022-07-24 DIAGNOSIS — Z992 Dependence on renal dialysis: Secondary | ICD-10-CM | POA: Diagnosis not present

## 2022-07-24 DIAGNOSIS — N186 End stage renal disease: Secondary | ICD-10-CM | POA: Diagnosis not present

## 2022-07-24 DIAGNOSIS — N2581 Secondary hyperparathyroidism of renal origin: Secondary | ICD-10-CM | POA: Diagnosis not present

## 2022-07-24 DIAGNOSIS — D509 Iron deficiency anemia, unspecified: Secondary | ICD-10-CM | POA: Diagnosis not present

## 2022-07-25 ENCOUNTER — Other Ambulatory Visit (HOSPITAL_BASED_OUTPATIENT_CLINIC_OR_DEPARTMENT_OTHER): Payer: Self-pay

## 2022-07-25 DIAGNOSIS — Z23 Encounter for immunization: Secondary | ICD-10-CM | POA: Diagnosis not present

## 2022-07-25 DIAGNOSIS — C4442 Squamous cell carcinoma of skin of scalp and neck: Secondary | ICD-10-CM | POA: Diagnosis not present

## 2022-07-25 DIAGNOSIS — L570 Actinic keratosis: Secondary | ICD-10-CM | POA: Diagnosis not present

## 2022-07-25 DIAGNOSIS — Z85820 Personal history of malignant melanoma of skin: Secondary | ICD-10-CM | POA: Diagnosis not present

## 2022-07-25 DIAGNOSIS — L905 Scar conditions and fibrosis of skin: Secondary | ICD-10-CM | POA: Diagnosis not present

## 2022-07-25 DIAGNOSIS — C44519 Basal cell carcinoma of skin of other part of trunk: Secondary | ICD-10-CM | POA: Diagnosis not present

## 2022-07-25 DIAGNOSIS — D1801 Hemangioma of skin and subcutaneous tissue: Secondary | ICD-10-CM | POA: Diagnosis not present

## 2022-07-25 DIAGNOSIS — D485 Neoplasm of uncertain behavior of skin: Secondary | ICD-10-CM | POA: Diagnosis not present

## 2022-07-25 DIAGNOSIS — Z85828 Personal history of other malignant neoplasm of skin: Secondary | ICD-10-CM | POA: Diagnosis not present

## 2022-07-25 DIAGNOSIS — L821 Other seborrheic keratosis: Secondary | ICD-10-CM | POA: Diagnosis not present

## 2022-07-25 MED ORDER — COVID-19 MRNA 2023-2024 VACCINE (COMIRNATY) 0.3 ML INJECTION
INTRAMUSCULAR | 0 refills | Status: DC
  Filled 2022-07-25: qty 0.3, 1d supply, fill #0

## 2022-07-31 DIAGNOSIS — D631 Anemia in chronic kidney disease: Secondary | ICD-10-CM | POA: Diagnosis not present

## 2022-07-31 DIAGNOSIS — N186 End stage renal disease: Secondary | ICD-10-CM | POA: Diagnosis not present

## 2022-07-31 DIAGNOSIS — Z992 Dependence on renal dialysis: Secondary | ICD-10-CM | POA: Diagnosis not present

## 2022-07-31 DIAGNOSIS — N2581 Secondary hyperparathyroidism of renal origin: Secondary | ICD-10-CM | POA: Diagnosis not present

## 2022-07-31 DIAGNOSIS — D509 Iron deficiency anemia, unspecified: Secondary | ICD-10-CM | POA: Diagnosis not present

## 2022-08-01 ENCOUNTER — Encounter: Payer: Self-pay | Admitting: *Deleted

## 2022-08-03 DIAGNOSIS — N186 End stage renal disease: Secondary | ICD-10-CM | POA: Diagnosis not present

## 2022-08-03 DIAGNOSIS — Z992 Dependence on renal dialysis: Secondary | ICD-10-CM | POA: Diagnosis not present

## 2022-08-14 DIAGNOSIS — N186 End stage renal disease: Secondary | ICD-10-CM | POA: Diagnosis not present

## 2022-08-14 DIAGNOSIS — Z992 Dependence on renal dialysis: Secondary | ICD-10-CM | POA: Diagnosis not present

## 2022-08-14 DIAGNOSIS — I129 Hypertensive chronic kidney disease with stage 1 through stage 4 chronic kidney disease, or unspecified chronic kidney disease: Secondary | ICD-10-CM | POA: Diagnosis not present

## 2022-08-16 DIAGNOSIS — Z992 Dependence on renal dialysis: Secondary | ICD-10-CM | POA: Diagnosis not present

## 2022-08-16 DIAGNOSIS — N186 End stage renal disease: Secondary | ICD-10-CM | POA: Diagnosis not present

## 2022-08-21 DIAGNOSIS — D509 Iron deficiency anemia, unspecified: Secondary | ICD-10-CM | POA: Diagnosis not present

## 2022-08-21 DIAGNOSIS — D631 Anemia in chronic kidney disease: Secondary | ICD-10-CM | POA: Diagnosis not present

## 2022-08-21 DIAGNOSIS — Z992 Dependence on renal dialysis: Secondary | ICD-10-CM | POA: Diagnosis not present

## 2022-08-21 DIAGNOSIS — N186 End stage renal disease: Secondary | ICD-10-CM | POA: Diagnosis not present

## 2022-08-21 DIAGNOSIS — E039 Hypothyroidism, unspecified: Secondary | ICD-10-CM | POA: Diagnosis not present

## 2022-08-21 DIAGNOSIS — N2581 Secondary hyperparathyroidism of renal origin: Secondary | ICD-10-CM | POA: Diagnosis not present

## 2022-08-22 DIAGNOSIS — N186 End stage renal disease: Secondary | ICD-10-CM | POA: Diagnosis not present

## 2022-08-22 DIAGNOSIS — K863 Pseudocyst of pancreas: Secondary | ICD-10-CM | POA: Diagnosis not present

## 2022-08-22 DIAGNOSIS — Z992 Dependence on renal dialysis: Secondary | ICD-10-CM | POA: Diagnosis not present

## 2022-08-22 DIAGNOSIS — Z8679 Personal history of other diseases of the circulatory system: Secondary | ICD-10-CM | POA: Diagnosis not present

## 2022-08-22 DIAGNOSIS — Z86711 Personal history of pulmonary embolism: Secondary | ICD-10-CM | POA: Diagnosis not present

## 2022-08-22 DIAGNOSIS — R9389 Abnormal findings on diagnostic imaging of other specified body structures: Secondary | ICD-10-CM | POA: Diagnosis not present

## 2022-08-22 LAB — TSH+FREE T4
Free T4: 1.85 ng/dL — ABNORMAL HIGH (ref 0.82–1.77)
TSH: 6.78 u[IU]/mL — ABNORMAL HIGH (ref 0.450–4.500)

## 2022-08-23 ENCOUNTER — Telehealth: Payer: Self-pay | Admitting: Cardiology

## 2022-08-23 DIAGNOSIS — D61818 Other pancytopenia: Secondary | ICD-10-CM | POA: Diagnosis not present

## 2022-08-23 DIAGNOSIS — Z923 Personal history of irradiation: Secondary | ICD-10-CM | POA: Diagnosis not present

## 2022-08-23 DIAGNOSIS — K7689 Other specified diseases of liver: Secondary | ICD-10-CM | POA: Diagnosis not present

## 2022-08-23 DIAGNOSIS — R911 Solitary pulmonary nodule: Secondary | ICD-10-CM | POA: Diagnosis not present

## 2022-08-23 DIAGNOSIS — C439 Malignant melanoma of skin, unspecified: Secondary | ICD-10-CM | POA: Diagnosis not present

## 2022-08-23 DIAGNOSIS — Z86718 Personal history of other venous thrombosis and embolism: Secondary | ICD-10-CM | POA: Diagnosis not present

## 2022-08-23 DIAGNOSIS — E538 Deficiency of other specified B group vitamins: Secondary | ICD-10-CM | POA: Diagnosis not present

## 2022-08-23 DIAGNOSIS — C7989 Secondary malignant neoplasm of other specified sites: Secondary | ICD-10-CM | POA: Diagnosis not present

## 2022-08-23 DIAGNOSIS — I2699 Other pulmonary embolism without acute cor pulmonale: Secondary | ICD-10-CM | POA: Diagnosis not present

## 2022-08-23 DIAGNOSIS — Z8616 Personal history of COVID-19: Secondary | ICD-10-CM | POA: Diagnosis not present

## 2022-08-23 DIAGNOSIS — Z992 Dependence on renal dialysis: Secondary | ICD-10-CM | POA: Diagnosis not present

## 2022-08-23 DIAGNOSIS — R251 Tremor, unspecified: Secondary | ICD-10-CM | POA: Diagnosis not present

## 2022-08-23 DIAGNOSIS — K6389 Other specified diseases of intestine: Secondary | ICD-10-CM | POA: Diagnosis not present

## 2022-08-23 DIAGNOSIS — J9691 Respiratory failure, unspecified with hypoxia: Secondary | ICD-10-CM | POA: Diagnosis not present

## 2022-08-23 DIAGNOSIS — K5730 Diverticulosis of large intestine without perforation or abscess without bleeding: Secondary | ICD-10-CM | POA: Diagnosis not present

## 2022-08-23 DIAGNOSIS — R7989 Other specified abnormal findings of blood chemistry: Secondary | ICD-10-CM

## 2022-08-23 DIAGNOSIS — C434 Malignant melanoma of scalp and neck: Secondary | ICD-10-CM | POA: Diagnosis not present

## 2022-08-23 DIAGNOSIS — R579 Shock, unspecified: Secondary | ICD-10-CM | POA: Diagnosis not present

## 2022-08-23 DIAGNOSIS — K859 Acute pancreatitis without necrosis or infection, unspecified: Secondary | ICD-10-CM | POA: Diagnosis not present

## 2022-08-23 DIAGNOSIS — I480 Paroxysmal atrial fibrillation: Secondary | ICD-10-CM | POA: Diagnosis not present

## 2022-08-23 DIAGNOSIS — N19 Unspecified kidney failure: Secondary | ICD-10-CM | POA: Diagnosis not present

## 2022-08-23 DIAGNOSIS — Z79899 Other long term (current) drug therapy: Secondary | ICD-10-CM

## 2022-08-23 NOTE — Telephone Encounter (Signed)
Left message for pt to call.

## 2022-08-23 NOTE — Telephone Encounter (Signed)
Calling back for results. Please advise

## 2022-08-26 MED ORDER — LEVOTHYROXINE SODIUM 75 MCG PO TABS: 75.0000 ug | ORAL_TABLET | Freq: Every day | ORAL | 3 refills | Status: AC

## 2022-08-26 NOTE — Telephone Encounter (Signed)
Pt's wife is returning call in regards to results.  Requesting return call.

## 2022-08-26 NOTE — Telephone Encounter (Signed)
Spoke with Patient wife, Webb Silversmith, okay per DPR.  Review TSH orders from Dr. Stanford Breed. Patient to decrease Synthroid to 52mg Qdailly from the '100mg'$ . And repeat blood work in 12 week.  Orders placed and mailed to patient; and new RX sent to patient pharmacy.

## 2022-08-27 DIAGNOSIS — Z85820 Personal history of malignant melanoma of skin: Secondary | ICD-10-CM | POA: Diagnosis not present

## 2022-08-27 DIAGNOSIS — Z85828 Personal history of other malignant neoplasm of skin: Secondary | ICD-10-CM | POA: Diagnosis not present

## 2022-08-27 DIAGNOSIS — C4442 Squamous cell carcinoma of skin of scalp and neck: Secondary | ICD-10-CM | POA: Diagnosis not present

## 2022-08-28 DIAGNOSIS — Z992 Dependence on renal dialysis: Secondary | ICD-10-CM | POA: Diagnosis not present

## 2022-08-28 DIAGNOSIS — D509 Iron deficiency anemia, unspecified: Secondary | ICD-10-CM | POA: Diagnosis not present

## 2022-08-28 DIAGNOSIS — N2581 Secondary hyperparathyroidism of renal origin: Secondary | ICD-10-CM | POA: Diagnosis not present

## 2022-08-28 DIAGNOSIS — N186 End stage renal disease: Secondary | ICD-10-CM | POA: Diagnosis not present

## 2022-08-28 DIAGNOSIS — D631 Anemia in chronic kidney disease: Secondary | ICD-10-CM | POA: Diagnosis not present

## 2022-09-03 DIAGNOSIS — N2581 Secondary hyperparathyroidism of renal origin: Secondary | ICD-10-CM | POA: Diagnosis not present

## 2022-09-03 DIAGNOSIS — Z992 Dependence on renal dialysis: Secondary | ICD-10-CM | POA: Diagnosis not present

## 2022-09-03 DIAGNOSIS — D631 Anemia in chronic kidney disease: Secondary | ICD-10-CM | POA: Diagnosis not present

## 2022-09-03 DIAGNOSIS — N186 End stage renal disease: Secondary | ICD-10-CM | POA: Diagnosis not present

## 2022-09-03 DIAGNOSIS — D509 Iron deficiency anemia, unspecified: Secondary | ICD-10-CM | POA: Diagnosis not present

## 2022-09-09 DIAGNOSIS — I252 Old myocardial infarction: Secondary | ICD-10-CM | POA: Diagnosis not present

## 2022-09-09 DIAGNOSIS — I618 Other nontraumatic intracerebral hemorrhage: Secondary | ICD-10-CM | POA: Diagnosis not present

## 2022-09-09 DIAGNOSIS — C434 Malignant melanoma of scalp and neck: Secondary | ICD-10-CM | POA: Diagnosis not present

## 2022-09-11 DIAGNOSIS — Z992 Dependence on renal dialysis: Secondary | ICD-10-CM | POA: Diagnosis not present

## 2022-09-11 DIAGNOSIS — D509 Iron deficiency anemia, unspecified: Secondary | ICD-10-CM | POA: Diagnosis not present

## 2022-09-11 DIAGNOSIS — N186 End stage renal disease: Secondary | ICD-10-CM | POA: Diagnosis not present

## 2022-09-11 DIAGNOSIS — D631 Anemia in chronic kidney disease: Secondary | ICD-10-CM | POA: Diagnosis not present

## 2022-09-11 DIAGNOSIS — N2581 Secondary hyperparathyroidism of renal origin: Secondary | ICD-10-CM | POA: Diagnosis not present

## 2022-09-13 DIAGNOSIS — N186 End stage renal disease: Secondary | ICD-10-CM | POA: Diagnosis not present

## 2022-09-13 DIAGNOSIS — Z992 Dependence on renal dialysis: Secondary | ICD-10-CM | POA: Diagnosis not present

## 2022-09-13 DIAGNOSIS — I129 Hypertensive chronic kidney disease with stage 1 through stage 4 chronic kidney disease, or unspecified chronic kidney disease: Secondary | ICD-10-CM | POA: Diagnosis not present

## 2022-09-18 DIAGNOSIS — N186 End stage renal disease: Secondary | ICD-10-CM | POA: Diagnosis not present

## 2022-09-18 DIAGNOSIS — N2581 Secondary hyperparathyroidism of renal origin: Secondary | ICD-10-CM | POA: Diagnosis not present

## 2022-09-18 DIAGNOSIS — D631 Anemia in chronic kidney disease: Secondary | ICD-10-CM | POA: Diagnosis not present

## 2022-09-18 DIAGNOSIS — Z992 Dependence on renal dialysis: Secondary | ICD-10-CM | POA: Diagnosis not present

## 2022-09-18 DIAGNOSIS — D509 Iron deficiency anemia, unspecified: Secondary | ICD-10-CM | POA: Diagnosis not present

## 2022-09-25 DIAGNOSIS — Z992 Dependence on renal dialysis: Secondary | ICD-10-CM | POA: Diagnosis not present

## 2022-09-25 DIAGNOSIS — D509 Iron deficiency anemia, unspecified: Secondary | ICD-10-CM | POA: Diagnosis not present

## 2022-09-25 DIAGNOSIS — N186 End stage renal disease: Secondary | ICD-10-CM | POA: Diagnosis not present

## 2022-09-25 DIAGNOSIS — N2581 Secondary hyperparathyroidism of renal origin: Secondary | ICD-10-CM | POA: Diagnosis not present

## 2022-09-25 DIAGNOSIS — D631 Anemia in chronic kidney disease: Secondary | ICD-10-CM | POA: Diagnosis not present

## 2022-09-26 DIAGNOSIS — L820 Inflamed seborrheic keratosis: Secondary | ICD-10-CM | POA: Diagnosis not present

## 2022-09-26 DIAGNOSIS — Z85828 Personal history of other malignant neoplasm of skin: Secondary | ICD-10-CM | POA: Diagnosis not present

## 2022-09-26 DIAGNOSIS — L570 Actinic keratosis: Secondary | ICD-10-CM | POA: Diagnosis not present

## 2022-09-26 DIAGNOSIS — D692 Other nonthrombocytopenic purpura: Secondary | ICD-10-CM | POA: Diagnosis not present

## 2022-09-26 DIAGNOSIS — C44329 Squamous cell carcinoma of skin of other parts of face: Secondary | ICD-10-CM | POA: Diagnosis not present

## 2022-09-26 DIAGNOSIS — C4441 Basal cell carcinoma of skin of scalp and neck: Secondary | ICD-10-CM | POA: Diagnosis not present

## 2022-09-26 DIAGNOSIS — L821 Other seborrheic keratosis: Secondary | ICD-10-CM | POA: Diagnosis not present

## 2022-09-26 DIAGNOSIS — L812 Freckles: Secondary | ICD-10-CM | POA: Diagnosis not present

## 2022-09-26 DIAGNOSIS — C44629 Squamous cell carcinoma of skin of left upper limb, including shoulder: Secondary | ICD-10-CM | POA: Diagnosis not present

## 2022-09-26 DIAGNOSIS — D485 Neoplasm of uncertain behavior of skin: Secondary | ICD-10-CM | POA: Diagnosis not present

## 2022-09-26 DIAGNOSIS — L853 Xerosis cutis: Secondary | ICD-10-CM | POA: Diagnosis not present

## 2022-09-26 DIAGNOSIS — Z85820 Personal history of malignant melanoma of skin: Secondary | ICD-10-CM | POA: Diagnosis not present

## 2022-10-02 DIAGNOSIS — D631 Anemia in chronic kidney disease: Secondary | ICD-10-CM | POA: Diagnosis not present

## 2022-10-02 DIAGNOSIS — Z992 Dependence on renal dialysis: Secondary | ICD-10-CM | POA: Diagnosis not present

## 2022-10-02 DIAGNOSIS — N186 End stage renal disease: Secondary | ICD-10-CM | POA: Diagnosis not present

## 2022-10-02 DIAGNOSIS — N2581 Secondary hyperparathyroidism of renal origin: Secondary | ICD-10-CM | POA: Diagnosis not present

## 2022-10-02 DIAGNOSIS — D509 Iron deficiency anemia, unspecified: Secondary | ICD-10-CM | POA: Diagnosis not present

## 2022-10-09 DIAGNOSIS — N2581 Secondary hyperparathyroidism of renal origin: Secondary | ICD-10-CM | POA: Diagnosis not present

## 2022-10-09 DIAGNOSIS — D631 Anemia in chronic kidney disease: Secondary | ICD-10-CM | POA: Diagnosis not present

## 2022-10-09 DIAGNOSIS — D509 Iron deficiency anemia, unspecified: Secondary | ICD-10-CM | POA: Diagnosis not present

## 2022-10-09 DIAGNOSIS — N186 End stage renal disease: Secondary | ICD-10-CM | POA: Diagnosis not present

## 2022-10-09 DIAGNOSIS — Z992 Dependence on renal dialysis: Secondary | ICD-10-CM | POA: Diagnosis not present

## 2022-10-14 DIAGNOSIS — I129 Hypertensive chronic kidney disease with stage 1 through stage 4 chronic kidney disease, or unspecified chronic kidney disease: Secondary | ICD-10-CM | POA: Diagnosis not present

## 2022-10-14 DIAGNOSIS — Z992 Dependence on renal dialysis: Secondary | ICD-10-CM | POA: Diagnosis not present

## 2022-10-14 DIAGNOSIS — N186 End stage renal disease: Secondary | ICD-10-CM | POA: Diagnosis not present

## 2022-10-17 NOTE — Progress Notes (Signed)
HPI: FU atrial flutter and CM. Previously seen and noted to have tachycardia.  He was therefore referred for an echocardiogram.  Echo June 2020 showed ejection fraction 25 to 30%, mild biatrial enlargement, mild mitral and tricuspid regurgitation. Cardiac catheterization July 2020 showed a 20% circumflex lesion, right atrial pressure of 14 and pulmonary capillary wedge pressure 8.  Medical therapy recommended.  Pt then seen in [place] and found to be in atrial flutter. Echocardiogram showed severe LV dysfunction.  He also apparently had ventricular tachycardia for 10 minutes though I do not have those results available and apparently he was asymptomatic. Cardioversion was recommended but patient left AGAINST MEDICAL ADVICE.  When I first evaluated the patient on 8/20 he noted increased dyspnea on exertion since April.  We arranged TEE guided cardioversion which was performed June 02, 2019.  TEE showed ejection fraction 20 to 25%, mild left atrial and right atrial enlargement and cardioversion was successful. Patient developed COVID January 2022 associated with pancreatitis and acute renal failure requiring dialysis.  He had recurrent atrial fibrillation and was placed on amiodarone.  Carotid Dopplers January 2022 near normal bilaterally.  Echocardiogram January 2022 showed normal LV function, mild biatrial enlargement.  Patient apparently subsequently found to have a pulmonary embolus by CT scan April 2022.  CTA April 2022 showed pulmonary embolus in the right main pulmonary artery, clot adherent to the right central venous catheter, extensive peripancreatic collection favored to be sequela of pancreatitis, retroperitoneal fluid collection.  His anticoagulation had been discontinued previously due to GI bleed.  He declined resuming anticoagulation and also IVC filter.  Since last seen he denies dyspnea, chest pain, palpitations or syncope.  He is only being dialyzed once weekly.  Current Outpatient  Medications  Medication Sig Dispense Refill   amiodarone (PACERONE) 200 MG tablet Take one pill twice a day till 02/25. Starting on 02/26 decrease to one pill daily. 35 tablet 0   amLODipine (NORVASC) 2.5 MG tablet Take 1 mg by mouth daily.     B Complex-C-Folic Acid (RENAL VITAMIN) 0.8 MG TABS Take 1 tablet by mouth at bedtime.     COVID-19 mRNA vaccine 2023-2024 (COMIRNATY) SUSP injection Inject into the muscle. 0.3 mL 0   docusate sodium (COLACE) 100 MG capsule Take 100 mg by mouth 2 (two) times daily.     Ferrous Sulfate (IRON SUPPLEMENT PO) Take 1 tablet by mouth daily.     fluticasone (FLONASE) 50 MCG/ACT nasal spray Place into both nostrils daily.     influenza vaccine adjuvanted (FLUAD) 0.5 ML injection Inject into the muscle. 0.5 mL 0   lactobacillus acidophilus & bulgar (LACTINEX) chewable tablet Chew 1 tablet by mouth 3 (three) times daily with meals. 90 tablet 0   levothyroxine (SYNTHROID) 75 MCG tablet Take 1 tablet (75 mcg total) by mouth daily before breakfast. 90 tablet 3   midodrine (PROAMATINE) 10 MG tablet Take 1 tablet (10 mg total) by mouth 3 (three) times daily. 90 tablet 0   pantoprazole (PROTONIX) 40 MG tablet Take 40 mg by mouth every morning.     polyethylene glycol powder (GLYCOLAX/MIRALAX) 17 GM/SCOOP powder Take 17 g by mouth daily as needed.     RSV vaccine recomb adjuvanted (AREXVY) 120 MCG/0.5ML injection Inject into the muscle. 0.5 mL 0   No current facility-administered medications for this visit.     Past Medical History:  Diagnosis Date   Acute pancreatitis    Atrial fibrillation ([facility])    CAD (coronary artery  disease) 04/2019   CHF (congestive heart failure) ([facility])    Chronic kidney disease    Melanoma ([place])    Vitamin B 12 deficiency     Past Surgical History:  Procedure Laterality Date   CARDIOVERSION N/A 06/02/2019   Procedure: CARDIOVERSION;  Surgeon: Jerline Pain, MD;  Location: [place] ENDOSCOPY;  Service: Cardiovascular;  Laterality: N/A;   IR  FLUORO GUIDE CV LINE RIGHT  11/06/2020   IR US GUIDE VASC ACCESS RIGHT  11/06/2020   RIGHT/LEFT HEART CATH AND CORONARY ANGIOGRAPHY N/A 04/27/2019   Procedure: RIGHT/LEFT HEART CATH AND CORONARY ANGIOGRAPHY;  Surgeon: Jolaine Artist, MD;  Location: [place] CV LAB;  Service: Cardiovascular;  Laterality: N/A;   TEE WITHOUT CARDIOVERSION N/A 06/02/2019   Procedure: TRANSESOPHAGEAL ECHOCARDIOGRAM (TEE);  Surgeon: Jerline Pain, MD;  Location: [facility] ENDOSCOPY;  Service: Cardiovascular;  Laterality: N/A;    Social History   Socioeconomic History   Marital status: Married    Spouse name: Not on file   Number of children: Not on file   Years of education: Not on file   Highest education level: Not on file  Occupational History   Not on file  Tobacco Use   Smoking status: Never   Smokeless tobacco: Never  Vaping Use   Vaping Use: Never used  Substance and Sexual Activity   Alcohol use: Yes    Alcohol/week: 7.0 standard drinks of alcohol    Types: 7 Glasses of wine per week   Drug use: Never   Sexual activity: Not Currently  Other Topics Concern   Not on file  Social History Narrative   Not on file   Social Determinants of Health   Financial Resource Strain: Not on file  Food Insecurity: Not on file  Transportation Needs: Not on file  Physical Activity: Not on file  Stress: Not on file  Social Connections: Not on file  Intimate Partner Violence: Not on file    Family History  Problem Relation Age of Onset   Melanoma Daughter     ROS: no fevers or chills, productive cough, hemoptysis, dysphasia, odynophagia, melena, hematochezia, dysuria, hematuria, rash, seizure activity, orthopnea, PND, pedal edema, claudication. Remaining systems are negative.  Physical Exam: Well-developed well-nourished in no acute distress.  Skin is warm and dry.  HEENT is normal.  Neck is supple.  Chest is clear to auscultation with normal expansion.  Cardiovascular exam is regular rate and  rhythm.  Abdominal exam nontender or distended. No masses palpated. Extremities show no edema. neuro grossly intact  ECG-normal sinus rhythm at a rate of 64, left anterior fascicular block, right bundle branch block, left ventricular hypertrophy.  Personally reviewed  A/P  1 nonischemic cardiomyopathy-previously felt to be tachycardia mediated.  LV function normalized on most recent echocardiogram.  Entresto and beta-blocker discontinued previously due to episodes of hypotension after dialysis was initiated.  2 paroxysmal atrial fibrillation/flutter-he remains in sinus rhythm.  Continue amiodarone.  Check TSH, free T4, liver functions and chest x-ray.  Anticoagulation was discontinued previously due to GI bleed.  He declined to resume understanding the high risk of CVA.  3 chronic combined systolic/diastolic congestive heart failure-volume is managed with dialysis.  4 prior pulmonary embolus-patient previously declined resuming anticoagulation.  5 end-stage renal disease-dialysis per nephrology.  Note patient is unclear about his medications.  He will contact us and we will review.  Kirk Ruths, MD

## 2022-10-23 DIAGNOSIS — N2581 Secondary hyperparathyroidism of renal origin: Secondary | ICD-10-CM | POA: Diagnosis not present

## 2022-10-23 DIAGNOSIS — Z992 Dependence on renal dialysis: Secondary | ICD-10-CM | POA: Diagnosis not present

## 2022-10-23 DIAGNOSIS — D631 Anemia in chronic kidney disease: Secondary | ICD-10-CM | POA: Diagnosis not present

## 2022-10-23 DIAGNOSIS — N186 End stage renal disease: Secondary | ICD-10-CM | POA: Diagnosis not present

## 2022-10-30 DIAGNOSIS — N186 End stage renal disease: Secondary | ICD-10-CM | POA: Diagnosis not present

## 2022-10-30 DIAGNOSIS — Z992 Dependence on renal dialysis: Secondary | ICD-10-CM | POA: Diagnosis not present

## 2022-10-30 DIAGNOSIS — N2581 Secondary hyperparathyroidism of renal origin: Secondary | ICD-10-CM | POA: Diagnosis not present

## 2022-10-30 DIAGNOSIS — D631 Anemia in chronic kidney disease: Secondary | ICD-10-CM | POA: Diagnosis not present

## 2022-10-31 ENCOUNTER — Encounter: Payer: Self-pay | Admitting: Cardiology

## 2022-10-31 ENCOUNTER — Ambulatory Visit
Admission: RE | Admit: 2022-10-31 | Discharge: 2022-10-31 | Disposition: A | Discharge status: Home / Self Care | Payer: Medicare Other | Source: Ambulatory Visit | Attending: Cardiology | Admitting: Cardiology

## 2022-10-31 ENCOUNTER — Ambulatory Visit: Payer: Medicare Other | Attending: Cardiology | Admitting: Cardiology

## 2022-10-31 VITALS — BP 138/62 | HR 64 | Ht 70.0 in | Wt 176.6 lb

## 2022-10-31 DIAGNOSIS — I480 Paroxysmal atrial fibrillation: Secondary | ICD-10-CM | POA: Insufficient documentation

## 2022-10-31 DIAGNOSIS — J984 Other disorders of lung: Secondary | ICD-10-CM | POA: Diagnosis not present

## 2022-10-31 DIAGNOSIS — I428 Other cardiomyopathies: Secondary | ICD-10-CM | POA: Diagnosis not present

## 2022-10-31 DIAGNOSIS — Z452 Encounter for adjustment and management of vascular access device: Secondary | ICD-10-CM | POA: Diagnosis not present

## 2022-10-31 DIAGNOSIS — I5022 Chronic systolic (congestive) heart failure: Secondary | ICD-10-CM | POA: Diagnosis not present

## 2022-10-31 DIAGNOSIS — Z85820 Personal history of malignant melanoma of skin: Secondary | ICD-10-CM | POA: Diagnosis not present

## 2022-10-31 DIAGNOSIS — R7989 Other specified abnormal findings of blood chemistry: Secondary | ICD-10-CM | POA: Diagnosis not present

## 2022-10-31 LAB — HEPATIC FUNCTION PANEL
ALT: 24 IU/L (ref 0–44)
AST: 27 IU/L (ref 0–40)
Albumin: 4.2 g/dL (ref 3.7–4.7)
Alkaline Phosphatase: 77 IU/L (ref 44–121)
Bilirubin Total: 0.2 mg/dL (ref 0.0–1.2)
Bilirubin, Direct: 0.11 mg/dL (ref 0.00–0.40)
Total Protein: 6.6 g/dL (ref 6.0–8.5)

## 2022-10-31 LAB — TSH+FREE T4
Free T4: 1.11 ng/dL (ref 0.82–1.77)
TSH: 26.5 u[IU]/mL — ABNORMAL HIGH (ref 0.450–4.500)

## 2022-10-31 NOTE — Patient Instructions (Signed)
  Testing/Procedures: A chest x-ray takes a picture of the organs and structures inside the chest, including the heart, lungs, and blood vessels. This test can show several things, including, whether the heart is enlarges; whether fluid is building up in the lungs; and whether pacemaker / defibrillator leads are still in place. [place]   Follow-Up: At [facility], you and your health needs are our priority.  As part of our continuing mission to provide you with exceptional heart care, we have created designated Provider Care Teams.  These Care Teams include your primary Cardiologist (physician) and Advanced Practice Providers (APPs -  Physician Assistants and Nurse Practitioners) who all work together to provide you with the care you need, when you need it.  We recommend signing up for the patient portal called "MyChart".  Sign up information is provided on this After Visit Summary.  MyChart is used to connect with patients for Virtual Visits (Telemedicine).  Patients are able to view lab/test results, encounter notes, upcoming appointments, etc.  Non-urgent messages can be sent to your provider as well.   To learn more about what you can do with MyChart, go to [email or web address].    Your next appointment:   6 month(s)  Provider:   Kirk Ruths, MD

## 2022-11-04 ENCOUNTER — Encounter: Payer: Self-pay | Admitting: *Deleted

## 2022-11-06 DIAGNOSIS — Z992 Dependence on renal dialysis: Secondary | ICD-10-CM | POA: Diagnosis not present

## 2022-11-06 DIAGNOSIS — D631 Anemia in chronic kidney disease: Secondary | ICD-10-CM | POA: Diagnosis not present

## 2022-11-06 DIAGNOSIS — N2581 Secondary hyperparathyroidism of renal origin: Secondary | ICD-10-CM | POA: Diagnosis not present

## 2022-11-06 DIAGNOSIS — N186 End stage renal disease: Secondary | ICD-10-CM | POA: Diagnosis not present

## 2022-11-13 DIAGNOSIS — N186 End stage renal disease: Secondary | ICD-10-CM | POA: Diagnosis not present

## 2022-11-13 DIAGNOSIS — N2581 Secondary hyperparathyroidism of renal origin: Secondary | ICD-10-CM | POA: Diagnosis not present

## 2022-11-13 DIAGNOSIS — D631 Anemia in chronic kidney disease: Secondary | ICD-10-CM | POA: Diagnosis not present

## 2022-11-13 DIAGNOSIS — Z992 Dependence on renal dialysis: Secondary | ICD-10-CM | POA: Diagnosis not present

## 2022-11-14 DIAGNOSIS — N186 End stage renal disease: Secondary | ICD-10-CM | POA: Diagnosis not present

## 2022-11-14 DIAGNOSIS — I129 Hypertensive chronic kidney disease with stage 1 through stage 4 chronic kidney disease, or unspecified chronic kidney disease: Secondary | ICD-10-CM | POA: Diagnosis not present

## 2022-11-14 DIAGNOSIS — Z992 Dependence on renal dialysis: Secondary | ICD-10-CM | POA: Diagnosis not present

## 2022-11-20 DIAGNOSIS — D631 Anemia in chronic kidney disease: Secondary | ICD-10-CM | POA: Diagnosis not present

## 2022-11-20 DIAGNOSIS — Z992 Dependence on renal dialysis: Secondary | ICD-10-CM | POA: Diagnosis not present

## 2022-11-20 DIAGNOSIS — N2581 Secondary hyperparathyroidism of renal origin: Secondary | ICD-10-CM | POA: Diagnosis not present

## 2022-11-20 DIAGNOSIS — D509 Iron deficiency anemia, unspecified: Secondary | ICD-10-CM | POA: Diagnosis not present

## 2022-11-20 DIAGNOSIS — Z23 Encounter for immunization: Secondary | ICD-10-CM | POA: Diagnosis not present

## 2022-11-20 DIAGNOSIS — N186 End stage renal disease: Secondary | ICD-10-CM | POA: Diagnosis not present

## 2022-11-27 DIAGNOSIS — N2581 Secondary hyperparathyroidism of renal origin: Secondary | ICD-10-CM | POA: Diagnosis not present

## 2022-11-27 DIAGNOSIS — Z992 Dependence on renal dialysis: Secondary | ICD-10-CM | POA: Diagnosis not present

## 2022-11-27 DIAGNOSIS — D631 Anemia in chronic kidney disease: Secondary | ICD-10-CM | POA: Diagnosis not present

## 2022-11-27 DIAGNOSIS — Z23 Encounter for immunization: Secondary | ICD-10-CM | POA: Diagnosis not present

## 2022-11-27 DIAGNOSIS — N186 End stage renal disease: Secondary | ICD-10-CM | POA: Diagnosis not present

## 2022-11-27 DIAGNOSIS — D509 Iron deficiency anemia, unspecified: Secondary | ICD-10-CM | POA: Diagnosis not present

## 2022-12-04 DIAGNOSIS — D631 Anemia in chronic kidney disease: Secondary | ICD-10-CM | POA: Diagnosis not present

## 2022-12-04 DIAGNOSIS — N186 End stage renal disease: Secondary | ICD-10-CM | POA: Diagnosis not present

## 2022-12-04 DIAGNOSIS — D509 Iron deficiency anemia, unspecified: Secondary | ICD-10-CM | POA: Diagnosis not present

## 2022-12-04 DIAGNOSIS — Z23 Encounter for immunization: Secondary | ICD-10-CM | POA: Diagnosis not present

## 2022-12-04 DIAGNOSIS — N2581 Secondary hyperparathyroidism of renal origin: Secondary | ICD-10-CM | POA: Diagnosis not present

## 2022-12-04 DIAGNOSIS — Z992 Dependence on renal dialysis: Secondary | ICD-10-CM | POA: Diagnosis not present

## 2022-12-11 DIAGNOSIS — N2581 Secondary hyperparathyroidism of renal origin: Secondary | ICD-10-CM | POA: Diagnosis not present

## 2022-12-11 DIAGNOSIS — Z992 Dependence on renal dialysis: Secondary | ICD-10-CM | POA: Diagnosis not present

## 2022-12-11 DIAGNOSIS — Z23 Encounter for immunization: Secondary | ICD-10-CM | POA: Diagnosis not present

## 2022-12-11 DIAGNOSIS — N186 End stage renal disease: Secondary | ICD-10-CM | POA: Diagnosis not present

## 2022-12-11 DIAGNOSIS — D509 Iron deficiency anemia, unspecified: Secondary | ICD-10-CM | POA: Diagnosis not present

## 2022-12-11 DIAGNOSIS — D631 Anemia in chronic kidney disease: Secondary | ICD-10-CM | POA: Diagnosis not present

## 2022-12-13 DIAGNOSIS — N186 End stage renal disease: Secondary | ICD-10-CM | POA: Diagnosis not present

## 2022-12-13 DIAGNOSIS — I129 Hypertensive chronic kidney disease with stage 1 through stage 4 chronic kidney disease, or unspecified chronic kidney disease: Secondary | ICD-10-CM | POA: Diagnosis not present

## 2022-12-13 DIAGNOSIS — Z992 Dependence on renal dialysis: Secondary | ICD-10-CM | POA: Diagnosis not present

## 2022-12-18 DIAGNOSIS — D631 Anemia in chronic kidney disease: Secondary | ICD-10-CM | POA: Diagnosis not present

## 2022-12-18 DIAGNOSIS — Z992 Dependence on renal dialysis: Secondary | ICD-10-CM | POA: Diagnosis not present

## 2022-12-18 DIAGNOSIS — N186 End stage renal disease: Secondary | ICD-10-CM | POA: Diagnosis not present

## 2022-12-18 DIAGNOSIS — N2581 Secondary hyperparathyroidism of renal origin: Secondary | ICD-10-CM | POA: Diagnosis not present

## 2022-12-18 DIAGNOSIS — D509 Iron deficiency anemia, unspecified: Secondary | ICD-10-CM | POA: Diagnosis not present

## 2022-12-19 DIAGNOSIS — Z85828 Personal history of other malignant neoplasm of skin: Secondary | ICD-10-CM | POA: Diagnosis not present

## 2022-12-19 DIAGNOSIS — D0461 Carcinoma in situ of skin of right upper limb, including shoulder: Secondary | ICD-10-CM | POA: Diagnosis not present

## 2022-12-19 DIAGNOSIS — C44529 Squamous cell carcinoma of skin of other part of trunk: Secondary | ICD-10-CM | POA: Diagnosis not present

## 2022-12-19 DIAGNOSIS — L905 Scar conditions and fibrosis of skin: Secondary | ICD-10-CM | POA: Diagnosis not present

## 2022-12-19 DIAGNOSIS — C44612 Basal cell carcinoma of skin of right upper limb, including shoulder: Secondary | ICD-10-CM | POA: Diagnosis not present

## 2022-12-19 DIAGNOSIS — C44622 Squamous cell carcinoma of skin of right upper limb, including shoulder: Secondary | ICD-10-CM | POA: Diagnosis not present

## 2022-12-19 DIAGNOSIS — L812 Freckles: Secondary | ICD-10-CM | POA: Diagnosis not present

## 2022-12-19 DIAGNOSIS — D485 Neoplasm of uncertain behavior of skin: Secondary | ICD-10-CM | POA: Diagnosis not present

## 2022-12-19 DIAGNOSIS — L821 Other seborrheic keratosis: Secondary | ICD-10-CM | POA: Diagnosis not present

## 2022-12-19 DIAGNOSIS — D1801 Hemangioma of skin and subcutaneous tissue: Secondary | ICD-10-CM | POA: Diagnosis not present

## 2022-12-19 DIAGNOSIS — Z85820 Personal history of malignant melanoma of skin: Secondary | ICD-10-CM | POA: Diagnosis not present

## 2022-12-19 DIAGNOSIS — L570 Actinic keratosis: Secondary | ICD-10-CM | POA: Diagnosis not present

## 2022-12-19 DIAGNOSIS — C4442 Squamous cell carcinoma of skin of scalp and neck: Secondary | ICD-10-CM | POA: Diagnosis not present

## 2022-12-25 DIAGNOSIS — N186 End stage renal disease: Secondary | ICD-10-CM | POA: Diagnosis not present

## 2022-12-25 DIAGNOSIS — N2581 Secondary hyperparathyroidism of renal origin: Secondary | ICD-10-CM | POA: Diagnosis not present

## 2022-12-25 DIAGNOSIS — D509 Iron deficiency anemia, unspecified: Secondary | ICD-10-CM | POA: Diagnosis not present

## 2022-12-25 DIAGNOSIS — D631 Anemia in chronic kidney disease: Secondary | ICD-10-CM | POA: Diagnosis not present

## 2022-12-25 DIAGNOSIS — Z992 Dependence on renal dialysis: Secondary | ICD-10-CM | POA: Diagnosis not present

## 2023-01-01 DIAGNOSIS — D631 Anemia in chronic kidney disease: Secondary | ICD-10-CM | POA: Diagnosis not present

## 2023-01-01 DIAGNOSIS — Z992 Dependence on renal dialysis: Secondary | ICD-10-CM | POA: Diagnosis not present

## 2023-01-01 DIAGNOSIS — N2581 Secondary hyperparathyroidism of renal origin: Secondary | ICD-10-CM | POA: Diagnosis not present

## 2023-01-01 DIAGNOSIS — N186 End stage renal disease: Secondary | ICD-10-CM | POA: Diagnosis not present

## 2023-01-01 DIAGNOSIS — D509 Iron deficiency anemia, unspecified: Secondary | ICD-10-CM | POA: Diagnosis not present

## 2023-01-08 DIAGNOSIS — D509 Iron deficiency anemia, unspecified: Secondary | ICD-10-CM | POA: Diagnosis not present

## 2023-01-08 DIAGNOSIS — D631 Anemia in chronic kidney disease: Secondary | ICD-10-CM | POA: Diagnosis not present

## 2023-01-08 DIAGNOSIS — N186 End stage renal disease: Secondary | ICD-10-CM | POA: Diagnosis not present

## 2023-01-08 DIAGNOSIS — Z992 Dependence on renal dialysis: Secondary | ICD-10-CM | POA: Diagnosis not present

## 2023-01-08 DIAGNOSIS — N2581 Secondary hyperparathyroidism of renal origin: Secondary | ICD-10-CM | POA: Diagnosis not present

## 2023-01-13 DIAGNOSIS — N186 End stage renal disease: Secondary | ICD-10-CM | POA: Diagnosis not present

## 2023-01-13 DIAGNOSIS — Z992 Dependence on renal dialysis: Secondary | ICD-10-CM | POA: Diagnosis not present

## 2023-01-13 DIAGNOSIS — I129 Hypertensive chronic kidney disease with stage 1 through stage 4 chronic kidney disease, or unspecified chronic kidney disease: Secondary | ICD-10-CM | POA: Diagnosis not present

## 2023-01-15 DIAGNOSIS — Z23 Encounter for immunization: Secondary | ICD-10-CM | POA: Diagnosis not present

## 2023-01-15 DIAGNOSIS — N186 End stage renal disease: Secondary | ICD-10-CM | POA: Diagnosis not present

## 2023-01-15 DIAGNOSIS — D631 Anemia in chronic kidney disease: Secondary | ICD-10-CM | POA: Diagnosis not present

## 2023-01-15 DIAGNOSIS — N2581 Secondary hyperparathyroidism of renal origin: Secondary | ICD-10-CM | POA: Diagnosis not present

## 2023-01-15 DIAGNOSIS — D509 Iron deficiency anemia, unspecified: Secondary | ICD-10-CM | POA: Diagnosis not present

## 2023-01-15 DIAGNOSIS — Z992 Dependence on renal dialysis: Secondary | ICD-10-CM | POA: Diagnosis not present

## 2023-01-22 DIAGNOSIS — Z992 Dependence on renal dialysis: Secondary | ICD-10-CM | POA: Diagnosis not present

## 2023-01-22 DIAGNOSIS — D631 Anemia in chronic kidney disease: Secondary | ICD-10-CM | POA: Diagnosis not present

## 2023-01-22 DIAGNOSIS — D509 Iron deficiency anemia, unspecified: Secondary | ICD-10-CM | POA: Diagnosis not present

## 2023-01-22 DIAGNOSIS — N186 End stage renal disease: Secondary | ICD-10-CM | POA: Diagnosis not present

## 2023-01-22 DIAGNOSIS — Z23 Encounter for immunization: Secondary | ICD-10-CM | POA: Diagnosis not present

## 2023-01-22 DIAGNOSIS — N2581 Secondary hyperparathyroidism of renal origin: Secondary | ICD-10-CM | POA: Diagnosis not present

## 2023-01-29 DIAGNOSIS — D509 Iron deficiency anemia, unspecified: Secondary | ICD-10-CM | POA: Diagnosis not present

## 2023-01-29 DIAGNOSIS — N186 End stage renal disease: Secondary | ICD-10-CM | POA: Diagnosis not present

## 2023-01-29 DIAGNOSIS — Z992 Dependence on renal dialysis: Secondary | ICD-10-CM | POA: Diagnosis not present

## 2023-01-29 DIAGNOSIS — N2581 Secondary hyperparathyroidism of renal origin: Secondary | ICD-10-CM | POA: Diagnosis not present

## 2023-01-29 DIAGNOSIS — D631 Anemia in chronic kidney disease: Secondary | ICD-10-CM | POA: Diagnosis not present

## 2023-01-29 DIAGNOSIS — Z23 Encounter for immunization: Secondary | ICD-10-CM | POA: Diagnosis not present

## 2023-02-05 DIAGNOSIS — N2581 Secondary hyperparathyroidism of renal origin: Secondary | ICD-10-CM | POA: Diagnosis not present

## 2023-02-05 DIAGNOSIS — N186 End stage renal disease: Secondary | ICD-10-CM | POA: Diagnosis not present

## 2023-02-05 DIAGNOSIS — Z23 Encounter for immunization: Secondary | ICD-10-CM | POA: Diagnosis not present

## 2023-02-05 DIAGNOSIS — D509 Iron deficiency anemia, unspecified: Secondary | ICD-10-CM | POA: Diagnosis not present

## 2023-02-05 DIAGNOSIS — Z992 Dependence on renal dialysis: Secondary | ICD-10-CM | POA: Diagnosis not present

## 2023-02-05 DIAGNOSIS — D631 Anemia in chronic kidney disease: Secondary | ICD-10-CM | POA: Diagnosis not present

## 2023-02-11 DIAGNOSIS — D509 Iron deficiency anemia, unspecified: Secondary | ICD-10-CM | POA: Diagnosis not present

## 2023-02-11 DIAGNOSIS — Z23 Encounter for immunization: Secondary | ICD-10-CM | POA: Diagnosis not present

## 2023-02-11 DIAGNOSIS — N186 End stage renal disease: Secondary | ICD-10-CM | POA: Diagnosis not present

## 2023-02-11 DIAGNOSIS — D631 Anemia in chronic kidney disease: Secondary | ICD-10-CM | POA: Diagnosis not present

## 2023-02-11 DIAGNOSIS — N2581 Secondary hyperparathyroidism of renal origin: Secondary | ICD-10-CM | POA: Diagnosis not present

## 2023-02-11 DIAGNOSIS — Z992 Dependence on renal dialysis: Secondary | ICD-10-CM | POA: Diagnosis not present

## 2023-02-12 DIAGNOSIS — I129 Hypertensive chronic kidney disease with stage 1 through stage 4 chronic kidney disease, or unspecified chronic kidney disease: Secondary | ICD-10-CM | POA: Diagnosis not present

## 2023-02-12 DIAGNOSIS — N186 End stage renal disease: Secondary | ICD-10-CM | POA: Diagnosis not present

## 2023-02-12 DIAGNOSIS — Z992 Dependence on renal dialysis: Secondary | ICD-10-CM | POA: Diagnosis not present

## 2023-02-19 DIAGNOSIS — D509 Iron deficiency anemia, unspecified: Secondary | ICD-10-CM | POA: Diagnosis not present

## 2023-02-19 DIAGNOSIS — N186 End stage renal disease: Secondary | ICD-10-CM | POA: Diagnosis not present

## 2023-02-19 DIAGNOSIS — D631 Anemia in chronic kidney disease: Secondary | ICD-10-CM | POA: Diagnosis not present

## 2023-02-19 DIAGNOSIS — N2581 Secondary hyperparathyroidism of renal origin: Secondary | ICD-10-CM | POA: Diagnosis not present

## 2023-02-19 DIAGNOSIS — Z992 Dependence on renal dialysis: Secondary | ICD-10-CM | POA: Diagnosis not present

## 2023-02-26 DIAGNOSIS — N2581 Secondary hyperparathyroidism of renal origin: Secondary | ICD-10-CM | POA: Diagnosis not present

## 2023-02-26 DIAGNOSIS — D509 Iron deficiency anemia, unspecified: Secondary | ICD-10-CM | POA: Diagnosis not present

## 2023-02-26 DIAGNOSIS — Z992 Dependence on renal dialysis: Secondary | ICD-10-CM | POA: Diagnosis not present

## 2023-02-26 DIAGNOSIS — D631 Anemia in chronic kidney disease: Secondary | ICD-10-CM | POA: Diagnosis not present

## 2023-02-26 DIAGNOSIS — N186 End stage renal disease: Secondary | ICD-10-CM | POA: Diagnosis not present

## 2023-03-05 DIAGNOSIS — Z992 Dependence on renal dialysis: Secondary | ICD-10-CM | POA: Diagnosis not present

## 2023-03-05 DIAGNOSIS — D509 Iron deficiency anemia, unspecified: Secondary | ICD-10-CM | POA: Diagnosis not present

## 2023-03-05 DIAGNOSIS — N2581 Secondary hyperparathyroidism of renal origin: Secondary | ICD-10-CM | POA: Diagnosis not present

## 2023-03-05 DIAGNOSIS — N186 End stage renal disease: Secondary | ICD-10-CM | POA: Diagnosis not present

## 2023-03-05 DIAGNOSIS — D631 Anemia in chronic kidney disease: Secondary | ICD-10-CM | POA: Diagnosis not present

## 2023-03-12 DIAGNOSIS — N2581 Secondary hyperparathyroidism of renal origin: Secondary | ICD-10-CM | POA: Diagnosis not present

## 2023-03-12 DIAGNOSIS — N186 End stage renal disease: Secondary | ICD-10-CM | POA: Diagnosis not present

## 2023-03-12 DIAGNOSIS — Z992 Dependence on renal dialysis: Secondary | ICD-10-CM | POA: Diagnosis not present

## 2023-03-12 DIAGNOSIS — D509 Iron deficiency anemia, unspecified: Secondary | ICD-10-CM | POA: Diagnosis not present

## 2023-03-12 DIAGNOSIS — D631 Anemia in chronic kidney disease: Secondary | ICD-10-CM | POA: Diagnosis not present

## 2023-03-14 DIAGNOSIS — Z8719 Personal history of other diseases of the digestive system: Secondary | ICD-10-CM | POA: Diagnosis not present

## 2023-03-14 DIAGNOSIS — Z08 Encounter for follow-up examination after completed treatment for malignant neoplasm: Secondary | ICD-10-CM | POA: Diagnosis not present

## 2023-03-14 DIAGNOSIS — Z79899 Other long term (current) drug therapy: Secondary | ICD-10-CM | POA: Diagnosis not present

## 2023-03-14 DIAGNOSIS — Z86711 Personal history of pulmonary embolism: Secondary | ICD-10-CM | POA: Diagnosis not present

## 2023-03-14 DIAGNOSIS — D61818 Other pancytopenia: Secondary | ICD-10-CM | POA: Diagnosis not present

## 2023-03-14 DIAGNOSIS — R918 Other nonspecific abnormal finding of lung field: Secondary | ICD-10-CM | POA: Diagnosis not present

## 2023-03-14 DIAGNOSIS — E538 Deficiency of other specified B group vitamins: Secondary | ICD-10-CM | POA: Diagnosis not present

## 2023-03-14 DIAGNOSIS — N19 Unspecified kidney failure: Secondary | ICD-10-CM | POA: Diagnosis not present

## 2023-03-14 DIAGNOSIS — K8689 Other specified diseases of pancreas: Secondary | ICD-10-CM | POA: Diagnosis not present

## 2023-03-14 DIAGNOSIS — Z992 Dependence on renal dialysis: Secondary | ICD-10-CM | POA: Diagnosis not present

## 2023-03-14 DIAGNOSIS — C434 Malignant melanoma of scalp and neck: Secondary | ICD-10-CM | POA: Diagnosis not present

## 2023-03-14 DIAGNOSIS — E039 Hypothyroidism, unspecified: Secondary | ICD-10-CM | POA: Diagnosis not present

## 2023-03-14 DIAGNOSIS — Z8616 Personal history of COVID-19: Secondary | ICD-10-CM | POA: Diagnosis not present

## 2023-03-14 DIAGNOSIS — Z85820 Personal history of malignant melanoma of skin: Secondary | ICD-10-CM | POA: Diagnosis not present

## 2023-03-14 DIAGNOSIS — Z79890 Hormone replacement therapy (postmenopausal): Secondary | ICD-10-CM | POA: Diagnosis not present

## 2023-03-14 DIAGNOSIS — N189 Chronic kidney disease, unspecified: Secondary | ICD-10-CM | POA: Diagnosis not present

## 2023-03-19 DIAGNOSIS — N2581 Secondary hyperparathyroidism of renal origin: Secondary | ICD-10-CM | POA: Diagnosis not present

## 2023-03-19 DIAGNOSIS — Z992 Dependence on renal dialysis: Secondary | ICD-10-CM | POA: Diagnosis not present

## 2023-03-19 DIAGNOSIS — D509 Iron deficiency anemia, unspecified: Secondary | ICD-10-CM | POA: Diagnosis not present

## 2023-03-19 DIAGNOSIS — D631 Anemia in chronic kidney disease: Secondary | ICD-10-CM | POA: Diagnosis not present

## 2023-03-19 DIAGNOSIS — N186 End stage renal disease: Secondary | ICD-10-CM | POA: Diagnosis not present

## 2023-03-20 DIAGNOSIS — H04523 Eversion of bilateral lacrimal punctum: Secondary | ICD-10-CM | POA: Diagnosis not present

## 2023-03-20 DIAGNOSIS — H04123 Dry eye syndrome of bilateral lacrimal glands: Secondary | ICD-10-CM | POA: Diagnosis not present

## 2023-03-20 DIAGNOSIS — H1045 Other chronic allergic conjunctivitis: Secondary | ICD-10-CM | POA: Diagnosis not present

## 2023-03-20 DIAGNOSIS — H2513 Age-related nuclear cataract, bilateral: Secondary | ICD-10-CM | POA: Diagnosis not present

## 2023-03-20 DIAGNOSIS — H0102A Squamous blepharitis right eye, upper and lower eyelids: Secondary | ICD-10-CM | POA: Diagnosis not present

## 2023-03-20 DIAGNOSIS — H401131 Primary open-angle glaucoma, bilateral, mild stage: Secondary | ICD-10-CM | POA: Diagnosis not present

## 2023-03-20 DIAGNOSIS — H43813 Vitreous degeneration, bilateral: Secondary | ICD-10-CM | POA: Diagnosis not present

## 2023-03-20 DIAGNOSIS — H0102B Squamous blepharitis left eye, upper and lower eyelids: Secondary | ICD-10-CM | POA: Diagnosis not present

## 2023-03-20 DIAGNOSIS — H5021 Vertical strabismus, right eye: Secondary | ICD-10-CM | POA: Diagnosis not present

## 2023-03-20 DIAGNOSIS — D3131 Benign neoplasm of right choroid: Secondary | ICD-10-CM | POA: Diagnosis not present

## 2023-03-25 DIAGNOSIS — Z992 Dependence on renal dialysis: Secondary | ICD-10-CM | POA: Diagnosis not present

## 2023-03-25 DIAGNOSIS — N186 End stage renal disease: Secondary | ICD-10-CM | POA: Diagnosis not present

## 2023-03-25 DIAGNOSIS — D631 Anemia in chronic kidney disease: Secondary | ICD-10-CM | POA: Diagnosis not present

## 2023-03-25 DIAGNOSIS — D509 Iron deficiency anemia, unspecified: Secondary | ICD-10-CM | POA: Diagnosis not present

## 2023-03-25 DIAGNOSIS — N2581 Secondary hyperparathyroidism of renal origin: Secondary | ICD-10-CM | POA: Diagnosis not present

## 2023-03-27 DIAGNOSIS — D1801 Hemangioma of skin and subcutaneous tissue: Secondary | ICD-10-CM | POA: Diagnosis not present

## 2023-03-27 DIAGNOSIS — D692 Other nonthrombocytopenic purpura: Secondary | ICD-10-CM | POA: Diagnosis not present

## 2023-03-27 DIAGNOSIS — C44519 Basal cell carcinoma of skin of other part of trunk: Secondary | ICD-10-CM | POA: Diagnosis not present

## 2023-03-27 DIAGNOSIS — D485 Neoplasm of uncertain behavior of skin: Secondary | ICD-10-CM | POA: Diagnosis not present

## 2023-03-27 DIAGNOSIS — Z85828 Personal history of other malignant neoplasm of skin: Secondary | ICD-10-CM | POA: Diagnosis not present

## 2023-03-27 DIAGNOSIS — L578 Other skin changes due to chronic exposure to nonionizing radiation: Secondary | ICD-10-CM | POA: Diagnosis not present

## 2023-03-27 DIAGNOSIS — L821 Other seborrheic keratosis: Secondary | ICD-10-CM | POA: Diagnosis not present

## 2023-03-27 DIAGNOSIS — Z85820 Personal history of malignant melanoma of skin: Secondary | ICD-10-CM | POA: Diagnosis not present

## 2023-03-27 DIAGNOSIS — L570 Actinic keratosis: Secondary | ICD-10-CM | POA: Diagnosis not present

## 2023-04-02 DIAGNOSIS — N2581 Secondary hyperparathyroidism of renal origin: Secondary | ICD-10-CM | POA: Diagnosis not present

## 2023-04-02 DIAGNOSIS — N186 End stage renal disease: Secondary | ICD-10-CM | POA: Diagnosis not present

## 2023-04-02 DIAGNOSIS — D631 Anemia in chronic kidney disease: Secondary | ICD-10-CM | POA: Diagnosis not present

## 2023-04-02 DIAGNOSIS — Z992 Dependence on renal dialysis: Secondary | ICD-10-CM | POA: Diagnosis not present

## 2023-04-02 DIAGNOSIS — D509 Iron deficiency anemia, unspecified: Secondary | ICD-10-CM | POA: Diagnosis not present

## 2023-04-14 DIAGNOSIS — I129 Hypertensive chronic kidney disease with stage 1 through stage 4 chronic kidney disease, or unspecified chronic kidney disease: Secondary | ICD-10-CM | POA: Diagnosis not present

## 2023-04-14 DIAGNOSIS — N186 End stage renal disease: Secondary | ICD-10-CM | POA: Diagnosis not present

## 2023-04-14 DIAGNOSIS — Z992 Dependence on renal dialysis: Secondary | ICD-10-CM | POA: Diagnosis not present

## 2023-04-16 DIAGNOSIS — N186 End stage renal disease: Secondary | ICD-10-CM | POA: Diagnosis not present

## 2023-04-16 DIAGNOSIS — D509 Iron deficiency anemia, unspecified: Secondary | ICD-10-CM | POA: Diagnosis not present

## 2023-04-16 DIAGNOSIS — D631 Anemia in chronic kidney disease: Secondary | ICD-10-CM | POA: Diagnosis not present

## 2023-04-16 DIAGNOSIS — N2581 Secondary hyperparathyroidism of renal origin: Secondary | ICD-10-CM | POA: Diagnosis not present

## 2023-04-16 DIAGNOSIS — Z23 Encounter for immunization: Secondary | ICD-10-CM | POA: Diagnosis not present

## 2023-04-16 DIAGNOSIS — Z992 Dependence on renal dialysis: Secondary | ICD-10-CM | POA: Diagnosis not present

## 2023-04-23 DIAGNOSIS — Z992 Dependence on renal dialysis: Secondary | ICD-10-CM | POA: Diagnosis not present

## 2023-04-23 DIAGNOSIS — N186 End stage renal disease: Secondary | ICD-10-CM | POA: Diagnosis not present

## 2023-04-23 DIAGNOSIS — Z23 Encounter for immunization: Secondary | ICD-10-CM | POA: Diagnosis not present

## 2023-04-23 DIAGNOSIS — N2581 Secondary hyperparathyroidism of renal origin: Secondary | ICD-10-CM | POA: Diagnosis not present

## 2023-04-23 DIAGNOSIS — D509 Iron deficiency anemia, unspecified: Secondary | ICD-10-CM | POA: Diagnosis not present

## 2023-04-23 DIAGNOSIS — D631 Anemia in chronic kidney disease: Secondary | ICD-10-CM | POA: Diagnosis not present

## 2023-04-24 DIAGNOSIS — C44629 Squamous cell carcinoma of skin of left upper limb, including shoulder: Secondary | ICD-10-CM | POA: Diagnosis not present

## 2023-04-24 DIAGNOSIS — D044 Carcinoma in situ of skin of scalp and neck: Secondary | ICD-10-CM | POA: Diagnosis not present

## 2023-04-24 DIAGNOSIS — C44622 Squamous cell carcinoma of skin of right upper limb, including shoulder: Secondary | ICD-10-CM | POA: Diagnosis not present

## 2023-04-24 DIAGNOSIS — Z85828 Personal history of other malignant neoplasm of skin: Secondary | ICD-10-CM | POA: Diagnosis not present

## 2023-04-24 DIAGNOSIS — Z85820 Personal history of malignant melanoma of skin: Secondary | ICD-10-CM | POA: Diagnosis not present

## 2023-04-24 DIAGNOSIS — D485 Neoplasm of uncertain behavior of skin: Secondary | ICD-10-CM | POA: Diagnosis not present

## 2023-04-30 DIAGNOSIS — Z23 Encounter for immunization: Secondary | ICD-10-CM | POA: Diagnosis not present

## 2023-04-30 DIAGNOSIS — Z992 Dependence on renal dialysis: Secondary | ICD-10-CM | POA: Diagnosis not present

## 2023-04-30 DIAGNOSIS — D509 Iron deficiency anemia, unspecified: Secondary | ICD-10-CM | POA: Diagnosis not present

## 2023-04-30 DIAGNOSIS — N2581 Secondary hyperparathyroidism of renal origin: Secondary | ICD-10-CM | POA: Diagnosis not present

## 2023-04-30 DIAGNOSIS — N186 End stage renal disease: Secondary | ICD-10-CM | POA: Diagnosis not present

## 2023-04-30 DIAGNOSIS — D631 Anemia in chronic kidney disease: Secondary | ICD-10-CM | POA: Diagnosis not present

## 2023-05-07 DIAGNOSIS — D631 Anemia in chronic kidney disease: Secondary | ICD-10-CM | POA: Diagnosis not present

## 2023-05-07 DIAGNOSIS — Z992 Dependence on renal dialysis: Secondary | ICD-10-CM | POA: Diagnosis not present

## 2023-05-07 DIAGNOSIS — D509 Iron deficiency anemia, unspecified: Secondary | ICD-10-CM | POA: Diagnosis not present

## 2023-05-07 DIAGNOSIS — N186 End stage renal disease: Secondary | ICD-10-CM | POA: Diagnosis not present

## 2023-05-07 DIAGNOSIS — Z23 Encounter for immunization: Secondary | ICD-10-CM | POA: Diagnosis not present

## 2023-05-07 DIAGNOSIS — N2581 Secondary hyperparathyroidism of renal origin: Secondary | ICD-10-CM | POA: Diagnosis not present

## 2023-05-08 DIAGNOSIS — U071 COVID-19: Secondary | ICD-10-CM | POA: Diagnosis not present

## 2023-05-08 DIAGNOSIS — R509 Fever, unspecified: Secondary | ICD-10-CM | POA: Diagnosis not present

## 2023-05-08 DIAGNOSIS — R051 Acute cough: Secondary | ICD-10-CM | POA: Diagnosis not present

## 2023-05-08 DIAGNOSIS — N186 End stage renal disease: Secondary | ICD-10-CM | POA: Diagnosis not present

## 2023-05-14 DIAGNOSIS — Z992 Dependence on renal dialysis: Secondary | ICD-10-CM | POA: Diagnosis not present

## 2023-05-14 DIAGNOSIS — Z23 Encounter for immunization: Secondary | ICD-10-CM | POA: Diagnosis not present

## 2023-05-14 DIAGNOSIS — D631 Anemia in chronic kidney disease: Secondary | ICD-10-CM | POA: Diagnosis not present

## 2023-05-14 DIAGNOSIS — N2581 Secondary hyperparathyroidism of renal origin: Secondary | ICD-10-CM | POA: Diagnosis not present

## 2023-05-14 DIAGNOSIS — D509 Iron deficiency anemia, unspecified: Secondary | ICD-10-CM | POA: Diagnosis not present

## 2023-05-14 DIAGNOSIS — N186 End stage renal disease: Secondary | ICD-10-CM | POA: Diagnosis not present

## 2023-05-15 DIAGNOSIS — Z79899 Other long term (current) drug therapy: Secondary | ICD-10-CM | POA: Diagnosis not present

## 2023-05-15 DIAGNOSIS — K429 Umbilical hernia without obstruction or gangrene: Secondary | ICD-10-CM | POA: Diagnosis not present

## 2023-05-15 DIAGNOSIS — Z0000 Encounter for general adult medical examination without abnormal findings: Secondary | ICD-10-CM | POA: Diagnosis not present

## 2023-05-15 DIAGNOSIS — Z23 Encounter for immunization: Secondary | ICD-10-CM | POA: Diagnosis not present

## 2023-05-15 DIAGNOSIS — E039 Hypothyroidism, unspecified: Secondary | ICD-10-CM | POA: Diagnosis not present

## 2023-05-15 DIAGNOSIS — D631 Anemia in chronic kidney disease: Secondary | ICD-10-CM | POA: Diagnosis not present

## 2023-05-15 DIAGNOSIS — E538 Deficiency of other specified B group vitamins: Secondary | ICD-10-CM | POA: Diagnosis not present

## 2023-05-15 DIAGNOSIS — R9389 Abnormal findings on diagnostic imaging of other specified body structures: Secondary | ICD-10-CM | POA: Diagnosis not present

## 2023-05-15 DIAGNOSIS — I5022 Chronic systolic (congestive) heart failure: Secondary | ICD-10-CM | POA: Diagnosis not present

## 2023-05-15 DIAGNOSIS — Z992 Dependence on renal dialysis: Secondary | ICD-10-CM | POA: Diagnosis not present

## 2023-05-15 DIAGNOSIS — N186 End stage renal disease: Secondary | ICD-10-CM | POA: Diagnosis not present

## 2023-05-15 DIAGNOSIS — I700 Atherosclerosis of aorta: Secondary | ICD-10-CM | POA: Diagnosis not present

## 2023-05-15 DIAGNOSIS — I129 Hypertensive chronic kidney disease with stage 1 through stage 4 chronic kidney disease, or unspecified chronic kidney disease: Secondary | ICD-10-CM | POA: Diagnosis not present

## 2023-05-21 DIAGNOSIS — N2581 Secondary hyperparathyroidism of renal origin: Secondary | ICD-10-CM | POA: Diagnosis not present

## 2023-05-21 DIAGNOSIS — D509 Iron deficiency anemia, unspecified: Secondary | ICD-10-CM | POA: Diagnosis not present

## 2023-05-21 DIAGNOSIS — N186 End stage renal disease: Secondary | ICD-10-CM | POA: Diagnosis not present

## 2023-05-21 DIAGNOSIS — D631 Anemia in chronic kidney disease: Secondary | ICD-10-CM | POA: Diagnosis not present

## 2023-05-21 DIAGNOSIS — Z992 Dependence on renal dialysis: Secondary | ICD-10-CM | POA: Diagnosis not present

## 2023-05-27 DIAGNOSIS — E039 Hypothyroidism, unspecified: Secondary | ICD-10-CM | POA: Diagnosis not present

## 2023-05-28 DIAGNOSIS — D509 Iron deficiency anemia, unspecified: Secondary | ICD-10-CM | POA: Diagnosis not present

## 2023-05-28 DIAGNOSIS — N186 End stage renal disease: Secondary | ICD-10-CM | POA: Diagnosis not present

## 2023-05-28 DIAGNOSIS — N2581 Secondary hyperparathyroidism of renal origin: Secondary | ICD-10-CM | POA: Diagnosis not present

## 2023-05-28 DIAGNOSIS — Z992 Dependence on renal dialysis: Secondary | ICD-10-CM | POA: Diagnosis not present

## 2023-05-28 DIAGNOSIS — D631 Anemia in chronic kidney disease: Secondary | ICD-10-CM | POA: Diagnosis not present

## 2023-05-29 DIAGNOSIS — Z85828 Personal history of other malignant neoplasm of skin: Secondary | ICD-10-CM | POA: Diagnosis not present

## 2023-05-29 DIAGNOSIS — Z85820 Personal history of malignant melanoma of skin: Secondary | ICD-10-CM | POA: Diagnosis not present

## 2023-05-29 DIAGNOSIS — C44519 Basal cell carcinoma of skin of other part of trunk: Secondary | ICD-10-CM | POA: Diagnosis not present

## 2023-06-04 DIAGNOSIS — Z992 Dependence on renal dialysis: Secondary | ICD-10-CM | POA: Diagnosis not present

## 2023-06-04 DIAGNOSIS — N186 End stage renal disease: Secondary | ICD-10-CM | POA: Diagnosis not present

## 2023-06-04 DIAGNOSIS — D631 Anemia in chronic kidney disease: Secondary | ICD-10-CM | POA: Diagnosis not present

## 2023-06-04 DIAGNOSIS — D509 Iron deficiency anemia, unspecified: Secondary | ICD-10-CM | POA: Diagnosis not present

## 2023-06-04 DIAGNOSIS — N2581 Secondary hyperparathyroidism of renal origin: Secondary | ICD-10-CM | POA: Diagnosis not present

## 2023-06-11 DIAGNOSIS — N186 End stage renal disease: Secondary | ICD-10-CM | POA: Diagnosis not present

## 2023-06-11 DIAGNOSIS — Z992 Dependence on renal dialysis: Secondary | ICD-10-CM | POA: Diagnosis not present

## 2023-06-11 DIAGNOSIS — N2581 Secondary hyperparathyroidism of renal origin: Secondary | ICD-10-CM | POA: Diagnosis not present

## 2023-06-11 DIAGNOSIS — D631 Anemia in chronic kidney disease: Secondary | ICD-10-CM | POA: Diagnosis not present

## 2023-06-11 DIAGNOSIS — D509 Iron deficiency anemia, unspecified: Secondary | ICD-10-CM | POA: Diagnosis not present

## 2023-06-15 DIAGNOSIS — N186 End stage renal disease: Secondary | ICD-10-CM | POA: Diagnosis not present

## 2023-06-15 DIAGNOSIS — Z992 Dependence on renal dialysis: Secondary | ICD-10-CM | POA: Diagnosis not present

## 2023-06-15 DIAGNOSIS — I129 Hypertensive chronic kidney disease with stage 1 through stage 4 chronic kidney disease, or unspecified chronic kidney disease: Secondary | ICD-10-CM | POA: Diagnosis not present

## 2023-06-18 DIAGNOSIS — N2581 Secondary hyperparathyroidism of renal origin: Secondary | ICD-10-CM | POA: Diagnosis not present

## 2023-06-18 DIAGNOSIS — Z992 Dependence on renal dialysis: Secondary | ICD-10-CM | POA: Diagnosis not present

## 2023-06-18 DIAGNOSIS — D631 Anemia in chronic kidney disease: Secondary | ICD-10-CM | POA: Diagnosis not present

## 2023-06-18 DIAGNOSIS — N186 End stage renal disease: Secondary | ICD-10-CM | POA: Diagnosis not present

## 2023-06-18 DIAGNOSIS — D509 Iron deficiency anemia, unspecified: Secondary | ICD-10-CM | POA: Diagnosis not present

## 2023-06-19 DIAGNOSIS — H1045 Other chronic allergic conjunctivitis: Secondary | ICD-10-CM | POA: Diagnosis not present

## 2023-06-19 DIAGNOSIS — H401131 Primary open-angle glaucoma, bilateral, mild stage: Secondary | ICD-10-CM | POA: Diagnosis not present

## 2023-06-19 DIAGNOSIS — H04523 Eversion of bilateral lacrimal punctum: Secondary | ICD-10-CM | POA: Diagnosis not present

## 2023-06-19 DIAGNOSIS — H0102A Squamous blepharitis right eye, upper and lower eyelids: Secondary | ICD-10-CM | POA: Diagnosis not present

## 2023-06-19 DIAGNOSIS — D3131 Benign neoplasm of right choroid: Secondary | ICD-10-CM | POA: Diagnosis not present

## 2023-06-19 DIAGNOSIS — H2513 Age-related nuclear cataract, bilateral: Secondary | ICD-10-CM | POA: Diagnosis not present

## 2023-06-19 DIAGNOSIS — H04123 Dry eye syndrome of bilateral lacrimal glands: Secondary | ICD-10-CM | POA: Diagnosis not present

## 2023-06-19 DIAGNOSIS — H0102B Squamous blepharitis left eye, upper and lower eyelids: Secondary | ICD-10-CM | POA: Diagnosis not present

## 2023-06-19 DIAGNOSIS — H5021 Vertical strabismus, right eye: Secondary | ICD-10-CM | POA: Diagnosis not present

## 2023-06-19 DIAGNOSIS — H43813 Vitreous degeneration, bilateral: Secondary | ICD-10-CM | POA: Diagnosis not present

## 2023-06-25 DIAGNOSIS — N2581 Secondary hyperparathyroidism of renal origin: Secondary | ICD-10-CM | POA: Diagnosis not present

## 2023-06-25 DIAGNOSIS — N186 End stage renal disease: Secondary | ICD-10-CM | POA: Diagnosis not present

## 2023-06-25 DIAGNOSIS — D509 Iron deficiency anemia, unspecified: Secondary | ICD-10-CM | POA: Diagnosis not present

## 2023-06-25 DIAGNOSIS — Z992 Dependence on renal dialysis: Secondary | ICD-10-CM | POA: Diagnosis not present

## 2023-06-25 DIAGNOSIS — D631 Anemia in chronic kidney disease: Secondary | ICD-10-CM | POA: Diagnosis not present

## 2023-06-26 DIAGNOSIS — D485 Neoplasm of uncertain behavior of skin: Secondary | ICD-10-CM | POA: Diagnosis not present

## 2023-06-26 DIAGNOSIS — L570 Actinic keratosis: Secondary | ICD-10-CM | POA: Diagnosis not present

## 2023-06-26 DIAGNOSIS — Z85828 Personal history of other malignant neoplasm of skin: Secondary | ICD-10-CM | POA: Diagnosis not present

## 2023-06-26 DIAGNOSIS — D0461 Carcinoma in situ of skin of right upper limb, including shoulder: Secondary | ICD-10-CM | POA: Diagnosis not present

## 2023-06-26 DIAGNOSIS — C44519 Basal cell carcinoma of skin of other part of trunk: Secondary | ICD-10-CM | POA: Diagnosis not present

## 2023-06-26 DIAGNOSIS — B078 Other viral warts: Secondary | ICD-10-CM | POA: Diagnosis not present

## 2023-06-26 DIAGNOSIS — L812 Freckles: Secondary | ICD-10-CM | POA: Diagnosis not present

## 2023-06-26 DIAGNOSIS — L821 Other seborrheic keratosis: Secondary | ICD-10-CM | POA: Diagnosis not present

## 2023-06-26 DIAGNOSIS — D1801 Hemangioma of skin and subcutaneous tissue: Secondary | ICD-10-CM | POA: Diagnosis not present

## 2023-06-26 DIAGNOSIS — Z85820 Personal history of malignant melanoma of skin: Secondary | ICD-10-CM | POA: Diagnosis not present

## 2023-07-02 DIAGNOSIS — Z992 Dependence on renal dialysis: Secondary | ICD-10-CM | POA: Diagnosis not present

## 2023-07-02 DIAGNOSIS — N186 End stage renal disease: Secondary | ICD-10-CM | POA: Diagnosis not present

## 2023-07-02 DIAGNOSIS — N2581 Secondary hyperparathyroidism of renal origin: Secondary | ICD-10-CM | POA: Diagnosis not present

## 2023-07-02 DIAGNOSIS — D631 Anemia in chronic kidney disease: Secondary | ICD-10-CM | POA: Diagnosis not present

## 2023-07-02 DIAGNOSIS — D509 Iron deficiency anemia, unspecified: Secondary | ICD-10-CM | POA: Diagnosis not present

## 2023-07-08 DIAGNOSIS — R946 Abnormal results of thyroid function studies: Secondary | ICD-10-CM | POA: Diagnosis not present

## 2023-07-09 DIAGNOSIS — N186 End stage renal disease: Secondary | ICD-10-CM | POA: Diagnosis not present

## 2023-07-09 DIAGNOSIS — D631 Anemia in chronic kidney disease: Secondary | ICD-10-CM | POA: Diagnosis not present

## 2023-07-09 DIAGNOSIS — Z992 Dependence on renal dialysis: Secondary | ICD-10-CM | POA: Diagnosis not present

## 2023-07-09 DIAGNOSIS — N2581 Secondary hyperparathyroidism of renal origin: Secondary | ICD-10-CM | POA: Diagnosis not present

## 2023-07-09 DIAGNOSIS — D509 Iron deficiency anemia, unspecified: Secondary | ICD-10-CM | POA: Diagnosis not present

## 2023-07-15 DIAGNOSIS — Z992 Dependence on renal dialysis: Secondary | ICD-10-CM | POA: Diagnosis not present

## 2023-07-15 DIAGNOSIS — N186 End stage renal disease: Secondary | ICD-10-CM | POA: Diagnosis not present

## 2023-07-15 DIAGNOSIS — I129 Hypertensive chronic kidney disease with stage 1 through stage 4 chronic kidney disease, or unspecified chronic kidney disease: Secondary | ICD-10-CM | POA: Diagnosis not present

## 2023-07-16 DIAGNOSIS — N2581 Secondary hyperparathyroidism of renal origin: Secondary | ICD-10-CM | POA: Diagnosis not present

## 2023-07-16 DIAGNOSIS — D509 Iron deficiency anemia, unspecified: Secondary | ICD-10-CM | POA: Diagnosis not present

## 2023-07-16 DIAGNOSIS — D631 Anemia in chronic kidney disease: Secondary | ICD-10-CM | POA: Diagnosis not present

## 2023-07-16 DIAGNOSIS — N186 End stage renal disease: Secondary | ICD-10-CM | POA: Diagnosis not present

## 2023-07-16 DIAGNOSIS — Z992 Dependence on renal dialysis: Secondary | ICD-10-CM | POA: Diagnosis not present

## 2023-07-23 DIAGNOSIS — D509 Iron deficiency anemia, unspecified: Secondary | ICD-10-CM | POA: Diagnosis not present

## 2023-07-23 DIAGNOSIS — Z992 Dependence on renal dialysis: Secondary | ICD-10-CM | POA: Diagnosis not present

## 2023-07-23 DIAGNOSIS — D631 Anemia in chronic kidney disease: Secondary | ICD-10-CM | POA: Diagnosis not present

## 2023-07-23 DIAGNOSIS — N2581 Secondary hyperparathyroidism of renal origin: Secondary | ICD-10-CM | POA: Diagnosis not present

## 2023-07-23 DIAGNOSIS — N186 End stage renal disease: Secondary | ICD-10-CM | POA: Diagnosis not present

## 2023-07-29 ENCOUNTER — Other Ambulatory Visit (HOSPITAL_BASED_OUTPATIENT_CLINIC_OR_DEPARTMENT_OTHER): Payer: Self-pay

## 2023-07-29 DIAGNOSIS — Z23 Encounter for immunization: Secondary | ICD-10-CM | POA: Diagnosis not present

## 2023-07-29 MED ORDER — FLUAD 0.5 ML IM SUSY
0.5000 mL | PREFILLED_SYRINGE | Freq: Once | INTRAMUSCULAR | 0 refills | Status: AC
  Filled 2023-07-29: qty 0.5, 1d supply, fill #0

## 2023-07-30 DIAGNOSIS — Z992 Dependence on renal dialysis: Secondary | ICD-10-CM | POA: Diagnosis not present

## 2023-07-30 DIAGNOSIS — D509 Iron deficiency anemia, unspecified: Secondary | ICD-10-CM | POA: Diagnosis not present

## 2023-07-30 DIAGNOSIS — N2581 Secondary hyperparathyroidism of renal origin: Secondary | ICD-10-CM | POA: Diagnosis not present

## 2023-07-30 DIAGNOSIS — D631 Anemia in chronic kidney disease: Secondary | ICD-10-CM | POA: Diagnosis not present

## 2023-07-30 DIAGNOSIS — N186 End stage renal disease: Secondary | ICD-10-CM | POA: Diagnosis not present

## 2023-08-06 DIAGNOSIS — N186 End stage renal disease: Secondary | ICD-10-CM | POA: Diagnosis not present

## 2023-08-06 DIAGNOSIS — D509 Iron deficiency anemia, unspecified: Secondary | ICD-10-CM | POA: Diagnosis not present

## 2023-08-06 DIAGNOSIS — Z992 Dependence on renal dialysis: Secondary | ICD-10-CM | POA: Diagnosis not present

## 2023-08-06 DIAGNOSIS — N2581 Secondary hyperparathyroidism of renal origin: Secondary | ICD-10-CM | POA: Diagnosis not present

## 2023-08-06 DIAGNOSIS — D631 Anemia in chronic kidney disease: Secondary | ICD-10-CM | POA: Diagnosis not present

## 2023-08-13 DIAGNOSIS — D509 Iron deficiency anemia, unspecified: Secondary | ICD-10-CM | POA: Diagnosis not present

## 2023-08-13 DIAGNOSIS — D631 Anemia in chronic kidney disease: Secondary | ICD-10-CM | POA: Diagnosis not present

## 2023-08-13 DIAGNOSIS — N2581 Secondary hyperparathyroidism of renal origin: Secondary | ICD-10-CM | POA: Diagnosis not present

## 2023-08-13 DIAGNOSIS — N186 End stage renal disease: Secondary | ICD-10-CM | POA: Diagnosis not present

## 2023-08-13 DIAGNOSIS — Z992 Dependence on renal dialysis: Secondary | ICD-10-CM | POA: Diagnosis not present

## 2023-08-15 DIAGNOSIS — Z992 Dependence on renal dialysis: Secondary | ICD-10-CM | POA: Diagnosis not present

## 2023-08-15 DIAGNOSIS — I129 Hypertensive chronic kidney disease with stage 1 through stage 4 chronic kidney disease, or unspecified chronic kidney disease: Secondary | ICD-10-CM | POA: Diagnosis not present

## 2023-08-15 DIAGNOSIS — N186 End stage renal disease: Secondary | ICD-10-CM | POA: Diagnosis not present

## 2023-08-20 DIAGNOSIS — D509 Iron deficiency anemia, unspecified: Secondary | ICD-10-CM | POA: Diagnosis not present

## 2023-08-20 DIAGNOSIS — Z992 Dependence on renal dialysis: Secondary | ICD-10-CM | POA: Diagnosis not present

## 2023-08-20 DIAGNOSIS — N186 End stage renal disease: Secondary | ICD-10-CM | POA: Diagnosis not present

## 2023-08-20 DIAGNOSIS — D631 Anemia in chronic kidney disease: Secondary | ICD-10-CM | POA: Diagnosis not present

## 2023-08-20 DIAGNOSIS — N2581 Secondary hyperparathyroidism of renal origin: Secondary | ICD-10-CM | POA: Diagnosis not present

## 2023-08-27 DIAGNOSIS — D631 Anemia in chronic kidney disease: Secondary | ICD-10-CM | POA: Diagnosis not present

## 2023-08-27 DIAGNOSIS — Z992 Dependence on renal dialysis: Secondary | ICD-10-CM | POA: Diagnosis not present

## 2023-08-27 DIAGNOSIS — N186 End stage renal disease: Secondary | ICD-10-CM | POA: Diagnosis not present

## 2023-08-27 DIAGNOSIS — N2581 Secondary hyperparathyroidism of renal origin: Secondary | ICD-10-CM | POA: Diagnosis not present

## 2023-08-27 DIAGNOSIS — D509 Iron deficiency anemia, unspecified: Secondary | ICD-10-CM | POA: Diagnosis not present

## 2023-09-03 DIAGNOSIS — D509 Iron deficiency anemia, unspecified: Secondary | ICD-10-CM | POA: Diagnosis not present

## 2023-09-03 DIAGNOSIS — Z992 Dependence on renal dialysis: Secondary | ICD-10-CM | POA: Diagnosis not present

## 2023-09-03 DIAGNOSIS — N186 End stage renal disease: Secondary | ICD-10-CM | POA: Diagnosis not present

## 2023-09-03 DIAGNOSIS — D631 Anemia in chronic kidney disease: Secondary | ICD-10-CM | POA: Diagnosis not present

## 2023-09-03 DIAGNOSIS — N2581 Secondary hyperparathyroidism of renal origin: Secondary | ICD-10-CM | POA: Diagnosis not present

## 2023-09-04 DIAGNOSIS — L821 Other seborrheic keratosis: Secondary | ICD-10-CM | POA: Diagnosis not present

## 2023-09-04 DIAGNOSIS — D692 Other nonthrombocytopenic purpura: Secondary | ICD-10-CM | POA: Diagnosis not present

## 2023-09-04 DIAGNOSIS — Z85820 Personal history of malignant melanoma of skin: Secondary | ICD-10-CM | POA: Diagnosis not present

## 2023-09-04 DIAGNOSIS — L570 Actinic keratosis: Secondary | ICD-10-CM | POA: Diagnosis not present

## 2023-09-04 DIAGNOSIS — D1801 Hemangioma of skin and subcutaneous tissue: Secondary | ICD-10-CM | POA: Diagnosis not present

## 2023-09-04 DIAGNOSIS — Z85828 Personal history of other malignant neoplasm of skin: Secondary | ICD-10-CM | POA: Diagnosis not present

## 2023-09-05 DIAGNOSIS — Z85820 Personal history of malignant melanoma of skin: Secondary | ICD-10-CM | POA: Diagnosis not present

## 2023-09-05 DIAGNOSIS — Z8719 Personal history of other diseases of the digestive system: Secondary | ICD-10-CM | POA: Diagnosis not present

## 2023-09-05 DIAGNOSIS — I480 Paroxysmal atrial fibrillation: Secondary | ICD-10-CM | POA: Diagnosis not present

## 2023-09-05 DIAGNOSIS — Z8616 Personal history of COVID-19: Secondary | ICD-10-CM | POA: Diagnosis not present

## 2023-09-05 DIAGNOSIS — C439 Malignant melanoma of skin, unspecified: Secondary | ICD-10-CM | POA: Diagnosis not present

## 2023-09-05 DIAGNOSIS — Z992 Dependence on renal dialysis: Secondary | ICD-10-CM | POA: Diagnosis not present

## 2023-09-05 DIAGNOSIS — Z86711 Personal history of pulmonary embolism: Secondary | ICD-10-CM | POA: Diagnosis not present

## 2023-09-05 DIAGNOSIS — K8689 Other specified diseases of pancreas: Secondary | ICD-10-CM | POA: Diagnosis not present

## 2023-09-05 DIAGNOSIS — E538 Deficiency of other specified B group vitamins: Secondary | ICD-10-CM | POA: Diagnosis not present

## 2023-09-05 DIAGNOSIS — Z85828 Personal history of other malignant neoplasm of skin: Secondary | ICD-10-CM | POA: Diagnosis not present

## 2023-09-05 DIAGNOSIS — N186 End stage renal disease: Secondary | ICD-10-CM | POA: Diagnosis not present

## 2023-09-05 DIAGNOSIS — Z923 Personal history of irradiation: Secondary | ICD-10-CM | POA: Diagnosis not present

## 2023-09-05 DIAGNOSIS — D61818 Other pancytopenia: Secondary | ICD-10-CM | POA: Diagnosis not present

## 2023-09-05 DIAGNOSIS — Z08 Encounter for follow-up examination after completed treatment for malignant neoplasm: Secondary | ICD-10-CM | POA: Diagnosis not present

## 2023-09-09 DIAGNOSIS — D509 Iron deficiency anemia, unspecified: Secondary | ICD-10-CM | POA: Diagnosis not present

## 2023-09-09 DIAGNOSIS — N186 End stage renal disease: Secondary | ICD-10-CM | POA: Diagnosis not present

## 2023-09-09 DIAGNOSIS — N2581 Secondary hyperparathyroidism of renal origin: Secondary | ICD-10-CM | POA: Diagnosis not present

## 2023-09-09 DIAGNOSIS — D631 Anemia in chronic kidney disease: Secondary | ICD-10-CM | POA: Diagnosis not present

## 2023-09-09 DIAGNOSIS — Z992 Dependence on renal dialysis: Secondary | ICD-10-CM | POA: Diagnosis not present

## 2023-09-14 DIAGNOSIS — I129 Hypertensive chronic kidney disease with stage 1 through stage 4 chronic kidney disease, or unspecified chronic kidney disease: Secondary | ICD-10-CM | POA: Diagnosis not present

## 2023-09-14 DIAGNOSIS — Z992 Dependence on renal dialysis: Secondary | ICD-10-CM | POA: Diagnosis not present

## 2023-09-14 DIAGNOSIS — N186 End stage renal disease: Secondary | ICD-10-CM | POA: Diagnosis not present

## 2023-09-17 DIAGNOSIS — N186 End stage renal disease: Secondary | ICD-10-CM | POA: Diagnosis not present

## 2023-09-17 DIAGNOSIS — D509 Iron deficiency anemia, unspecified: Secondary | ICD-10-CM | POA: Diagnosis not present

## 2023-09-17 DIAGNOSIS — Z992 Dependence on renal dialysis: Secondary | ICD-10-CM | POA: Diagnosis not present

## 2023-09-17 DIAGNOSIS — D631 Anemia in chronic kidney disease: Secondary | ICD-10-CM | POA: Diagnosis not present

## 2023-09-17 DIAGNOSIS — N2581 Secondary hyperparathyroidism of renal origin: Secondary | ICD-10-CM | POA: Diagnosis not present

## 2023-09-17 DIAGNOSIS — Z23 Encounter for immunization: Secondary | ICD-10-CM | POA: Diagnosis not present

## 2023-09-23 ENCOUNTER — Other Ambulatory Visit (HOSPITAL_BASED_OUTPATIENT_CLINIC_OR_DEPARTMENT_OTHER): Payer: Self-pay

## 2023-09-23 DIAGNOSIS — Z23 Encounter for immunization: Secondary | ICD-10-CM | POA: Diagnosis not present

## 2023-09-23 MED ORDER — COVID-19 MRNA VAC-TRIS(PFIZER) 30 MCG/0.3ML IM SUSY
0.3000 mL | PREFILLED_SYRINGE | Freq: Once | INTRAMUSCULAR | 0 refills | Status: AC
  Filled 2023-09-23: qty 0.3, 1d supply, fill #0

## 2023-09-24 DIAGNOSIS — N2581 Secondary hyperparathyroidism of renal origin: Secondary | ICD-10-CM | POA: Diagnosis not present

## 2023-09-24 DIAGNOSIS — Z23 Encounter for immunization: Secondary | ICD-10-CM | POA: Diagnosis not present

## 2023-09-24 DIAGNOSIS — D509 Iron deficiency anemia, unspecified: Secondary | ICD-10-CM | POA: Diagnosis not present

## 2023-09-24 DIAGNOSIS — D631 Anemia in chronic kidney disease: Secondary | ICD-10-CM | POA: Diagnosis not present

## 2023-09-24 DIAGNOSIS — N186 End stage renal disease: Secondary | ICD-10-CM | POA: Diagnosis not present

## 2023-09-24 DIAGNOSIS — Z992 Dependence on renal dialysis: Secondary | ICD-10-CM | POA: Diagnosis not present

## 2023-10-01 DIAGNOSIS — Z992 Dependence on renal dialysis: Secondary | ICD-10-CM | POA: Diagnosis not present

## 2023-10-01 DIAGNOSIS — Z23 Encounter for immunization: Secondary | ICD-10-CM | POA: Diagnosis not present

## 2023-10-01 DIAGNOSIS — D509 Iron deficiency anemia, unspecified: Secondary | ICD-10-CM | POA: Diagnosis not present

## 2023-10-01 DIAGNOSIS — N186 End stage renal disease: Secondary | ICD-10-CM | POA: Diagnosis not present

## 2023-10-01 DIAGNOSIS — N2581 Secondary hyperparathyroidism of renal origin: Secondary | ICD-10-CM | POA: Diagnosis not present

## 2023-10-01 DIAGNOSIS — D631 Anemia in chronic kidney disease: Secondary | ICD-10-CM | POA: Diagnosis not present

## 2023-10-07 DIAGNOSIS — D509 Iron deficiency anemia, unspecified: Secondary | ICD-10-CM | POA: Diagnosis not present

## 2023-10-07 DIAGNOSIS — Z992 Dependence on renal dialysis: Secondary | ICD-10-CM | POA: Diagnosis not present

## 2023-10-07 DIAGNOSIS — N2581 Secondary hyperparathyroidism of renal origin: Secondary | ICD-10-CM | POA: Diagnosis not present

## 2023-10-07 DIAGNOSIS — Z23 Encounter for immunization: Secondary | ICD-10-CM | POA: Diagnosis not present

## 2023-10-07 DIAGNOSIS — N186 End stage renal disease: Secondary | ICD-10-CM | POA: Diagnosis not present

## 2023-10-07 DIAGNOSIS — D631 Anemia in chronic kidney disease: Secondary | ICD-10-CM | POA: Diagnosis not present

## 2023-10-15 DIAGNOSIS — N186 End stage renal disease: Secondary | ICD-10-CM | POA: Diagnosis not present

## 2023-10-15 DIAGNOSIS — I129 Hypertensive chronic kidney disease with stage 1 through stage 4 chronic kidney disease, or unspecified chronic kidney disease: Secondary | ICD-10-CM | POA: Diagnosis not present

## 2023-10-15 DIAGNOSIS — Z992 Dependence on renal dialysis: Secondary | ICD-10-CM | POA: Diagnosis not present

## 2023-10-17 DIAGNOSIS — Z992 Dependence on renal dialysis: Secondary | ICD-10-CM | POA: Diagnosis not present

## 2023-10-17 DIAGNOSIS — N2581 Secondary hyperparathyroidism of renal origin: Secondary | ICD-10-CM | POA: Diagnosis not present

## 2023-10-17 DIAGNOSIS — N186 End stage renal disease: Secondary | ICD-10-CM | POA: Diagnosis not present

## 2023-10-22 DIAGNOSIS — D509 Iron deficiency anemia, unspecified: Secondary | ICD-10-CM | POA: Diagnosis not present

## 2023-10-22 DIAGNOSIS — D631 Anemia in chronic kidney disease: Secondary | ICD-10-CM | POA: Diagnosis not present

## 2023-10-22 DIAGNOSIS — Z23 Encounter for immunization: Secondary | ICD-10-CM | POA: Diagnosis not present

## 2023-10-22 DIAGNOSIS — N2581 Secondary hyperparathyroidism of renal origin: Secondary | ICD-10-CM | POA: Diagnosis not present

## 2023-10-22 DIAGNOSIS — N186 End stage renal disease: Secondary | ICD-10-CM | POA: Diagnosis not present

## 2023-10-22 DIAGNOSIS — Z992 Dependence on renal dialysis: Secondary | ICD-10-CM | POA: Diagnosis not present

## 2023-10-23 DIAGNOSIS — D1801 Hemangioma of skin and subcutaneous tissue: Secondary | ICD-10-CM | POA: Diagnosis not present

## 2023-10-23 DIAGNOSIS — L570 Actinic keratosis: Secondary | ICD-10-CM | POA: Diagnosis not present

## 2023-10-23 DIAGNOSIS — Z85828 Personal history of other malignant neoplasm of skin: Secondary | ICD-10-CM | POA: Diagnosis not present

## 2023-10-23 DIAGNOSIS — D692 Other nonthrombocytopenic purpura: Secondary | ICD-10-CM | POA: Diagnosis not present

## 2023-10-23 DIAGNOSIS — C44612 Basal cell carcinoma of skin of right upper limb, including shoulder: Secondary | ICD-10-CM | POA: Diagnosis not present

## 2023-10-23 DIAGNOSIS — D485 Neoplasm of uncertain behavior of skin: Secondary | ICD-10-CM | POA: Diagnosis not present

## 2023-10-23 DIAGNOSIS — C44321 Squamous cell carcinoma of skin of nose: Secondary | ICD-10-CM | POA: Diagnosis not present

## 2023-10-23 DIAGNOSIS — L812 Freckles: Secondary | ICD-10-CM | POA: Diagnosis not present

## 2023-10-23 DIAGNOSIS — L821 Other seborrheic keratosis: Secondary | ICD-10-CM | POA: Diagnosis not present

## 2023-10-23 DIAGNOSIS — Z85820 Personal history of malignant melanoma of skin: Secondary | ICD-10-CM | POA: Diagnosis not present

## 2023-10-29 DIAGNOSIS — N2581 Secondary hyperparathyroidism of renal origin: Secondary | ICD-10-CM | POA: Diagnosis not present

## 2023-10-29 DIAGNOSIS — Z23 Encounter for immunization: Secondary | ICD-10-CM | POA: Diagnosis not present

## 2023-10-29 DIAGNOSIS — D509 Iron deficiency anemia, unspecified: Secondary | ICD-10-CM | POA: Diagnosis not present

## 2023-10-29 DIAGNOSIS — D631 Anemia in chronic kidney disease: Secondary | ICD-10-CM | POA: Diagnosis not present

## 2023-10-29 DIAGNOSIS — N186 End stage renal disease: Secondary | ICD-10-CM | POA: Diagnosis not present

## 2023-10-29 DIAGNOSIS — Z992 Dependence on renal dialysis: Secondary | ICD-10-CM | POA: Diagnosis not present

## 2023-11-01 DIAGNOSIS — D631 Anemia in chronic kidney disease: Secondary | ICD-10-CM | POA: Diagnosis not present

## 2023-11-01 DIAGNOSIS — N186 End stage renal disease: Secondary | ICD-10-CM | POA: Diagnosis not present

## 2023-11-01 DIAGNOSIS — D649 Anemia, unspecified: Secondary | ICD-10-CM | POA: Diagnosis not present

## 2023-11-01 DIAGNOSIS — Z992 Dependence on renal dialysis: Secondary | ICD-10-CM | POA: Diagnosis not present

## 2023-11-02 DIAGNOSIS — D649 Anemia, unspecified: Secondary | ICD-10-CM | POA: Diagnosis not present

## 2023-11-05 DIAGNOSIS — D631 Anemia in chronic kidney disease: Secondary | ICD-10-CM | POA: Diagnosis not present

## 2023-11-05 DIAGNOSIS — Z23 Encounter for immunization: Secondary | ICD-10-CM | POA: Diagnosis not present

## 2023-11-05 DIAGNOSIS — N2581 Secondary hyperparathyroidism of renal origin: Secondary | ICD-10-CM | POA: Diagnosis not present

## 2023-11-05 DIAGNOSIS — Z992 Dependence on renal dialysis: Secondary | ICD-10-CM | POA: Diagnosis not present

## 2023-11-05 DIAGNOSIS — N186 End stage renal disease: Secondary | ICD-10-CM | POA: Diagnosis not present

## 2023-11-05 DIAGNOSIS — D509 Iron deficiency anemia, unspecified: Secondary | ICD-10-CM | POA: Diagnosis not present

## 2023-11-12 DIAGNOSIS — N2581 Secondary hyperparathyroidism of renal origin: Secondary | ICD-10-CM | POA: Diagnosis not present

## 2023-11-12 DIAGNOSIS — D631 Anemia in chronic kidney disease: Secondary | ICD-10-CM | POA: Diagnosis not present

## 2023-11-12 DIAGNOSIS — Z23 Encounter for immunization: Secondary | ICD-10-CM | POA: Diagnosis not present

## 2023-11-12 DIAGNOSIS — N186 End stage renal disease: Secondary | ICD-10-CM | POA: Diagnosis not present

## 2023-11-12 DIAGNOSIS — Z992 Dependence on renal dialysis: Secondary | ICD-10-CM | POA: Diagnosis not present

## 2023-11-12 DIAGNOSIS — D509 Iron deficiency anemia, unspecified: Secondary | ICD-10-CM | POA: Diagnosis not present

## 2023-11-15 DIAGNOSIS — N186 End stage renal disease: Secondary | ICD-10-CM | POA: Diagnosis not present

## 2023-11-15 DIAGNOSIS — I129 Hypertensive chronic kidney disease with stage 1 through stage 4 chronic kidney disease, or unspecified chronic kidney disease: Secondary | ICD-10-CM | POA: Diagnosis not present

## 2023-11-15 DIAGNOSIS — Z992 Dependence on renal dialysis: Secondary | ICD-10-CM | POA: Diagnosis not present

## 2023-11-17 DIAGNOSIS — N2581 Secondary hyperparathyroidism of renal origin: Secondary | ICD-10-CM | POA: Diagnosis not present

## 2023-11-17 DIAGNOSIS — N186 End stage renal disease: Secondary | ICD-10-CM | POA: Diagnosis not present

## 2023-11-17 DIAGNOSIS — D631 Anemia in chronic kidney disease: Secondary | ICD-10-CM | POA: Diagnosis not present

## 2023-11-17 DIAGNOSIS — D509 Iron deficiency anemia, unspecified: Secondary | ICD-10-CM | POA: Diagnosis not present

## 2023-11-17 DIAGNOSIS — Z992 Dependence on renal dialysis: Secondary | ICD-10-CM | POA: Diagnosis not present

## 2023-11-19 DIAGNOSIS — D631 Anemia in chronic kidney disease: Secondary | ICD-10-CM | POA: Diagnosis not present

## 2023-11-19 DIAGNOSIS — N186 End stage renal disease: Secondary | ICD-10-CM | POA: Diagnosis not present

## 2023-11-19 DIAGNOSIS — D509 Iron deficiency anemia, unspecified: Secondary | ICD-10-CM | POA: Diagnosis not present

## 2023-11-19 DIAGNOSIS — N2581 Secondary hyperparathyroidism of renal origin: Secondary | ICD-10-CM | POA: Diagnosis not present

## 2023-11-19 DIAGNOSIS — Z992 Dependence on renal dialysis: Secondary | ICD-10-CM | POA: Diagnosis not present

## 2023-12-02 DIAGNOSIS — N186 End stage renal disease: Secondary | ICD-10-CM | POA: Diagnosis not present

## 2023-12-02 DIAGNOSIS — D509 Iron deficiency anemia, unspecified: Secondary | ICD-10-CM | POA: Diagnosis not present

## 2023-12-02 DIAGNOSIS — D631 Anemia in chronic kidney disease: Secondary | ICD-10-CM | POA: Diagnosis not present

## 2023-12-02 DIAGNOSIS — N2581 Secondary hyperparathyroidism of renal origin: Secondary | ICD-10-CM | POA: Diagnosis not present

## 2023-12-02 DIAGNOSIS — Z992 Dependence on renal dialysis: Secondary | ICD-10-CM | POA: Diagnosis not present

## 2023-12-10 DIAGNOSIS — D509 Iron deficiency anemia, unspecified: Secondary | ICD-10-CM | POA: Diagnosis not present

## 2023-12-10 DIAGNOSIS — N2581 Secondary hyperparathyroidism of renal origin: Secondary | ICD-10-CM | POA: Diagnosis not present

## 2023-12-10 DIAGNOSIS — N186 End stage renal disease: Secondary | ICD-10-CM | POA: Diagnosis not present

## 2023-12-10 DIAGNOSIS — Z992 Dependence on renal dialysis: Secondary | ICD-10-CM | POA: Diagnosis not present

## 2023-12-10 DIAGNOSIS — D631 Anemia in chronic kidney disease: Secondary | ICD-10-CM | POA: Diagnosis not present

## 2023-12-17 DIAGNOSIS — N186 End stage renal disease: Secondary | ICD-10-CM | POA: Diagnosis not present

## 2023-12-17 DIAGNOSIS — N2581 Secondary hyperparathyroidism of renal origin: Secondary | ICD-10-CM | POA: Diagnosis not present

## 2023-12-17 DIAGNOSIS — D509 Iron deficiency anemia, unspecified: Secondary | ICD-10-CM | POA: Diagnosis not present

## 2023-12-17 DIAGNOSIS — Z23 Encounter for immunization: Secondary | ICD-10-CM | POA: Diagnosis not present

## 2023-12-17 DIAGNOSIS — Z992 Dependence on renal dialysis: Secondary | ICD-10-CM | POA: Diagnosis not present

## 2023-12-17 DIAGNOSIS — D631 Anemia in chronic kidney disease: Secondary | ICD-10-CM | POA: Diagnosis not present

## 2023-12-22 DIAGNOSIS — I700 Atherosclerosis of aorta: Secondary | ICD-10-CM | POA: Diagnosis not present

## 2023-12-22 DIAGNOSIS — N186 End stage renal disease: Secondary | ICD-10-CM | POA: Diagnosis not present

## 2023-12-22 DIAGNOSIS — I483 Typical atrial flutter: Secondary | ICD-10-CM | POA: Diagnosis not present

## 2023-12-22 DIAGNOSIS — I5022 Chronic systolic (congestive) heart failure: Secondary | ICD-10-CM | POA: Diagnosis not present

## 2023-12-24 DIAGNOSIS — Z992 Dependence on renal dialysis: Secondary | ICD-10-CM | POA: Diagnosis not present

## 2023-12-24 DIAGNOSIS — D631 Anemia in chronic kidney disease: Secondary | ICD-10-CM | POA: Diagnosis not present

## 2023-12-24 DIAGNOSIS — N186 End stage renal disease: Secondary | ICD-10-CM | POA: Diagnosis not present

## 2023-12-24 DIAGNOSIS — Z23 Encounter for immunization: Secondary | ICD-10-CM | POA: Diagnosis not present

## 2023-12-24 DIAGNOSIS — D509 Iron deficiency anemia, unspecified: Secondary | ICD-10-CM | POA: Diagnosis not present

## 2023-12-24 DIAGNOSIS — N2581 Secondary hyperparathyroidism of renal origin: Secondary | ICD-10-CM | POA: Diagnosis not present

## 2023-12-27 ENCOUNTER — Other Ambulatory Visit (HOSPITAL_COMMUNITY): Payer: Self-pay

## 2023-12-29 DIAGNOSIS — H2513 Age-related nuclear cataract, bilateral: Secondary | ICD-10-CM | POA: Diagnosis not present

## 2023-12-29 DIAGNOSIS — H401131 Primary open-angle glaucoma, bilateral, mild stage: Secondary | ICD-10-CM | POA: Diagnosis not present

## 2023-12-29 DIAGNOSIS — D3131 Benign neoplasm of right choroid: Secondary | ICD-10-CM | POA: Diagnosis not present

## 2023-12-29 DIAGNOSIS — H0102A Squamous blepharitis right eye, upper and lower eyelids: Secondary | ICD-10-CM | POA: Diagnosis not present

## 2023-12-29 DIAGNOSIS — H04123 Dry eye syndrome of bilateral lacrimal glands: Secondary | ICD-10-CM | POA: Diagnosis not present

## 2023-12-29 DIAGNOSIS — H0102B Squamous blepharitis left eye, upper and lower eyelids: Secondary | ICD-10-CM | POA: Diagnosis not present

## 2023-12-29 DIAGNOSIS — H1045 Other chronic allergic conjunctivitis: Secondary | ICD-10-CM | POA: Diagnosis not present

## 2023-12-29 DIAGNOSIS — H04523 Eversion of bilateral lacrimal punctum: Secondary | ICD-10-CM | POA: Diagnosis not present

## 2023-12-29 DIAGNOSIS — H43813 Vitreous degeneration, bilateral: Secondary | ICD-10-CM | POA: Diagnosis not present

## 2023-12-29 DIAGNOSIS — H5021 Vertical strabismus, right eye: Secondary | ICD-10-CM | POA: Diagnosis not present

## 2023-12-31 DIAGNOSIS — Z23 Encounter for immunization: Secondary | ICD-10-CM | POA: Diagnosis not present

## 2023-12-31 DIAGNOSIS — N2581 Secondary hyperparathyroidism of renal origin: Secondary | ICD-10-CM | POA: Diagnosis not present

## 2023-12-31 DIAGNOSIS — D631 Anemia in chronic kidney disease: Secondary | ICD-10-CM | POA: Diagnosis not present

## 2023-12-31 DIAGNOSIS — Z992 Dependence on renal dialysis: Secondary | ICD-10-CM | POA: Diagnosis not present

## 2023-12-31 DIAGNOSIS — N186 End stage renal disease: Secondary | ICD-10-CM | POA: Diagnosis not present

## 2023-12-31 DIAGNOSIS — D509 Iron deficiency anemia, unspecified: Secondary | ICD-10-CM | POA: Diagnosis not present

## 2024-01-07 DIAGNOSIS — Z992 Dependence on renal dialysis: Secondary | ICD-10-CM | POA: Diagnosis not present

## 2024-01-07 DIAGNOSIS — Z23 Encounter for immunization: Secondary | ICD-10-CM | POA: Diagnosis not present

## 2024-01-07 DIAGNOSIS — D509 Iron deficiency anemia, unspecified: Secondary | ICD-10-CM | POA: Diagnosis not present

## 2024-01-07 DIAGNOSIS — D631 Anemia in chronic kidney disease: Secondary | ICD-10-CM | POA: Diagnosis not present

## 2024-01-07 DIAGNOSIS — N2581 Secondary hyperparathyroidism of renal origin: Secondary | ICD-10-CM | POA: Diagnosis not present

## 2024-01-07 DIAGNOSIS — N186 End stage renal disease: Secondary | ICD-10-CM | POA: Diagnosis not present

## 2024-01-08 DIAGNOSIS — Z85828 Personal history of other malignant neoplasm of skin: Secondary | ICD-10-CM | POA: Diagnosis not present

## 2024-01-08 DIAGNOSIS — L812 Freckles: Secondary | ICD-10-CM | POA: Diagnosis not present

## 2024-01-08 DIAGNOSIS — D1801 Hemangioma of skin and subcutaneous tissue: Secondary | ICD-10-CM | POA: Diagnosis not present

## 2024-01-08 DIAGNOSIS — C44519 Basal cell carcinoma of skin of other part of trunk: Secondary | ICD-10-CM | POA: Diagnosis not present

## 2024-01-08 DIAGNOSIS — D485 Neoplasm of uncertain behavior of skin: Secondary | ICD-10-CM | POA: Diagnosis not present

## 2024-01-08 DIAGNOSIS — Z85820 Personal history of malignant melanoma of skin: Secondary | ICD-10-CM | POA: Diagnosis not present

## 2024-01-08 DIAGNOSIS — L853 Xerosis cutis: Secondary | ICD-10-CM | POA: Diagnosis not present

## 2024-01-08 DIAGNOSIS — L821 Other seborrheic keratosis: Secondary | ICD-10-CM | POA: Diagnosis not present

## 2024-01-08 DIAGNOSIS — L820 Inflamed seborrheic keratosis: Secondary | ICD-10-CM | POA: Diagnosis not present

## 2024-01-08 DIAGNOSIS — C44622 Squamous cell carcinoma of skin of right upper limb, including shoulder: Secondary | ICD-10-CM | POA: Diagnosis not present

## 2024-01-08 DIAGNOSIS — L570 Actinic keratosis: Secondary | ICD-10-CM | POA: Diagnosis not present

## 2024-01-12 DIAGNOSIS — I700 Atherosclerosis of aorta: Secondary | ICD-10-CM | POA: Diagnosis not present

## 2024-01-12 DIAGNOSIS — I5022 Chronic systolic (congestive) heart failure: Secondary | ICD-10-CM | POA: Diagnosis not present

## 2024-01-12 DIAGNOSIS — N186 End stage renal disease: Secondary | ICD-10-CM | POA: Diagnosis not present

## 2024-01-12 DIAGNOSIS — I483 Typical atrial flutter: Secondary | ICD-10-CM | POA: Diagnosis not present

## 2024-01-13 DIAGNOSIS — Z992 Dependence on renal dialysis: Secondary | ICD-10-CM | POA: Diagnosis not present

## 2024-01-13 DIAGNOSIS — I129 Hypertensive chronic kidney disease with stage 1 through stage 4 chronic kidney disease, or unspecified chronic kidney disease: Secondary | ICD-10-CM | POA: Diagnosis not present

## 2024-01-13 DIAGNOSIS — N186 End stage renal disease: Secondary | ICD-10-CM | POA: Diagnosis not present

## 2024-01-14 DIAGNOSIS — N186 End stage renal disease: Secondary | ICD-10-CM | POA: Diagnosis not present

## 2024-01-14 DIAGNOSIS — N2581 Secondary hyperparathyroidism of renal origin: Secondary | ICD-10-CM | POA: Diagnosis not present

## 2024-01-14 DIAGNOSIS — D631 Anemia in chronic kidney disease: Secondary | ICD-10-CM | POA: Diagnosis not present

## 2024-01-14 DIAGNOSIS — Z23 Encounter for immunization: Secondary | ICD-10-CM | POA: Diagnosis not present

## 2024-01-14 DIAGNOSIS — Z992 Dependence on renal dialysis: Secondary | ICD-10-CM | POA: Diagnosis not present

## 2024-01-14 DIAGNOSIS — D509 Iron deficiency anemia, unspecified: Secondary | ICD-10-CM | POA: Diagnosis not present

## 2024-01-20 DIAGNOSIS — N186 End stage renal disease: Secondary | ICD-10-CM | POA: Diagnosis not present

## 2024-01-20 DIAGNOSIS — I700 Atherosclerosis of aorta: Secondary | ICD-10-CM | POA: Diagnosis not present

## 2024-01-20 DIAGNOSIS — I5022 Chronic systolic (congestive) heart failure: Secondary | ICD-10-CM | POA: Diagnosis not present

## 2024-01-20 DIAGNOSIS — I483 Typical atrial flutter: Secondary | ICD-10-CM | POA: Diagnosis not present

## 2024-01-21 DIAGNOSIS — Z23 Encounter for immunization: Secondary | ICD-10-CM | POA: Diagnosis not present

## 2024-01-21 DIAGNOSIS — D509 Iron deficiency anemia, unspecified: Secondary | ICD-10-CM | POA: Diagnosis not present

## 2024-01-21 DIAGNOSIS — N186 End stage renal disease: Secondary | ICD-10-CM | POA: Diagnosis not present

## 2024-01-21 DIAGNOSIS — N2581 Secondary hyperparathyroidism of renal origin: Secondary | ICD-10-CM | POA: Diagnosis not present

## 2024-01-21 DIAGNOSIS — Z992 Dependence on renal dialysis: Secondary | ICD-10-CM | POA: Diagnosis not present

## 2024-01-21 DIAGNOSIS — D631 Anemia in chronic kidney disease: Secondary | ICD-10-CM | POA: Diagnosis not present

## 2024-01-28 DIAGNOSIS — Z23 Encounter for immunization: Secondary | ICD-10-CM | POA: Diagnosis not present

## 2024-01-28 DIAGNOSIS — D631 Anemia in chronic kidney disease: Secondary | ICD-10-CM | POA: Diagnosis not present

## 2024-01-28 DIAGNOSIS — N2581 Secondary hyperparathyroidism of renal origin: Secondary | ICD-10-CM | POA: Diagnosis not present

## 2024-01-28 DIAGNOSIS — Z992 Dependence on renal dialysis: Secondary | ICD-10-CM | POA: Diagnosis not present

## 2024-01-28 DIAGNOSIS — D509 Iron deficiency anemia, unspecified: Secondary | ICD-10-CM | POA: Diagnosis not present

## 2024-01-28 DIAGNOSIS — N186 End stage renal disease: Secondary | ICD-10-CM | POA: Diagnosis not present

## 2024-02-04 DIAGNOSIS — D509 Iron deficiency anemia, unspecified: Secondary | ICD-10-CM | POA: Diagnosis not present

## 2024-02-04 DIAGNOSIS — N186 End stage renal disease: Secondary | ICD-10-CM | POA: Diagnosis not present

## 2024-02-04 DIAGNOSIS — D631 Anemia in chronic kidney disease: Secondary | ICD-10-CM | POA: Diagnosis not present

## 2024-02-04 DIAGNOSIS — Z23 Encounter for immunization: Secondary | ICD-10-CM | POA: Diagnosis not present

## 2024-02-04 DIAGNOSIS — N2581 Secondary hyperparathyroidism of renal origin: Secondary | ICD-10-CM | POA: Diagnosis not present

## 2024-02-04 DIAGNOSIS — Z992 Dependence on renal dialysis: Secondary | ICD-10-CM | POA: Diagnosis not present

## 2024-02-10 DIAGNOSIS — D3131 Benign neoplasm of right choroid: Secondary | ICD-10-CM | POA: Diagnosis not present

## 2024-02-10 DIAGNOSIS — H04123 Dry eye syndrome of bilateral lacrimal glands: Secondary | ICD-10-CM | POA: Diagnosis not present

## 2024-02-10 DIAGNOSIS — H401132 Primary open-angle glaucoma, bilateral, moderate stage: Secondary | ICD-10-CM | POA: Diagnosis not present

## 2024-02-10 DIAGNOSIS — H1045 Other chronic allergic conjunctivitis: Secondary | ICD-10-CM | POA: Diagnosis not present

## 2024-02-10 DIAGNOSIS — H5021 Vertical strabismus, right eye: Secondary | ICD-10-CM | POA: Diagnosis not present

## 2024-02-10 DIAGNOSIS — H2513 Age-related nuclear cataract, bilateral: Secondary | ICD-10-CM | POA: Diagnosis not present

## 2024-02-10 DIAGNOSIS — H43813 Vitreous degeneration, bilateral: Secondary | ICD-10-CM | POA: Diagnosis not present

## 2024-02-10 DIAGNOSIS — H04523 Eversion of bilateral lacrimal punctum: Secondary | ICD-10-CM | POA: Diagnosis not present

## 2024-02-11 DIAGNOSIS — I700 Atherosclerosis of aorta: Secondary | ICD-10-CM | POA: Diagnosis not present

## 2024-02-11 DIAGNOSIS — I483 Typical atrial flutter: Secondary | ICD-10-CM | POA: Diagnosis not present

## 2024-02-11 DIAGNOSIS — N186 End stage renal disease: Secondary | ICD-10-CM | POA: Diagnosis not present

## 2024-02-11 DIAGNOSIS — I5022 Chronic systolic (congestive) heart failure: Secondary | ICD-10-CM | POA: Diagnosis not present

## 2024-02-12 DIAGNOSIS — Z992 Dependence on renal dialysis: Secondary | ICD-10-CM | POA: Diagnosis not present

## 2024-02-12 DIAGNOSIS — D509 Iron deficiency anemia, unspecified: Secondary | ICD-10-CM | POA: Diagnosis not present

## 2024-02-12 DIAGNOSIS — D631 Anemia in chronic kidney disease: Secondary | ICD-10-CM | POA: Diagnosis not present

## 2024-02-12 DIAGNOSIS — N2581 Secondary hyperparathyroidism of renal origin: Secondary | ICD-10-CM | POA: Diagnosis not present

## 2024-02-12 DIAGNOSIS — N186 End stage renal disease: Secondary | ICD-10-CM | POA: Diagnosis not present

## 2024-02-19 DIAGNOSIS — I483 Typical atrial flutter: Secondary | ICD-10-CM | POA: Diagnosis not present

## 2024-02-19 DIAGNOSIS — I700 Atherosclerosis of aorta: Secondary | ICD-10-CM | POA: Diagnosis not present

## 2024-02-19 DIAGNOSIS — N186 End stage renal disease: Secondary | ICD-10-CM | POA: Diagnosis not present

## 2024-02-19 DIAGNOSIS — N2581 Secondary hyperparathyroidism of renal origin: Secondary | ICD-10-CM | POA: Diagnosis not present

## 2024-02-19 DIAGNOSIS — Z992 Dependence on renal dialysis: Secondary | ICD-10-CM | POA: Diagnosis not present

## 2024-02-19 DIAGNOSIS — D509 Iron deficiency anemia, unspecified: Secondary | ICD-10-CM | POA: Diagnosis not present

## 2024-02-19 DIAGNOSIS — I5022 Chronic systolic (congestive) heart failure: Secondary | ICD-10-CM | POA: Diagnosis not present

## 2024-02-19 DIAGNOSIS — D631 Anemia in chronic kidney disease: Secondary | ICD-10-CM | POA: Diagnosis not present

## 2024-02-26 DIAGNOSIS — Z992 Dependence on renal dialysis: Secondary | ICD-10-CM | POA: Diagnosis not present

## 2024-02-26 DIAGNOSIS — D509 Iron deficiency anemia, unspecified: Secondary | ICD-10-CM | POA: Diagnosis not present

## 2024-02-26 DIAGNOSIS — N2581 Secondary hyperparathyroidism of renal origin: Secondary | ICD-10-CM | POA: Diagnosis not present

## 2024-02-26 DIAGNOSIS — D631 Anemia in chronic kidney disease: Secondary | ICD-10-CM | POA: Diagnosis not present

## 2024-02-26 DIAGNOSIS — N186 End stage renal disease: Secondary | ICD-10-CM | POA: Diagnosis not present

## 2024-03-02 DIAGNOSIS — D631 Anemia in chronic kidney disease: Secondary | ICD-10-CM | POA: Diagnosis not present

## 2024-03-02 DIAGNOSIS — N2581 Secondary hyperparathyroidism of renal origin: Secondary | ICD-10-CM | POA: Diagnosis not present

## 2024-03-02 DIAGNOSIS — D509 Iron deficiency anemia, unspecified: Secondary | ICD-10-CM | POA: Diagnosis not present

## 2024-03-02 DIAGNOSIS — N186 End stage renal disease: Secondary | ICD-10-CM | POA: Diagnosis not present

## 2024-03-02 DIAGNOSIS — Z992 Dependence on renal dialysis: Secondary | ICD-10-CM | POA: Diagnosis not present

## 2024-03-03 DIAGNOSIS — C799 Secondary malignant neoplasm of unspecified site: Secondary | ICD-10-CM | POA: Diagnosis not present

## 2024-03-03 DIAGNOSIS — D631 Anemia in chronic kidney disease: Secondary | ICD-10-CM | POA: Diagnosis not present

## 2024-03-03 DIAGNOSIS — N186 End stage renal disease: Secondary | ICD-10-CM | POA: Diagnosis not present

## 2024-03-03 DIAGNOSIS — K429 Umbilical hernia without obstruction or gangrene: Secondary | ICD-10-CM | POA: Diagnosis not present

## 2024-03-03 DIAGNOSIS — R197 Diarrhea, unspecified: Secondary | ICD-10-CM | POA: Diagnosis not present

## 2024-03-03 DIAGNOSIS — I5022 Chronic systolic (congestive) heart failure: Secondary | ICD-10-CM | POA: Diagnosis not present

## 2024-03-03 DIAGNOSIS — E039 Hypothyroidism, unspecified: Secondary | ICD-10-CM | POA: Diagnosis not present

## 2024-03-03 DIAGNOSIS — R8290 Unspecified abnormal findings in urine: Secondary | ICD-10-CM | POA: Diagnosis not present

## 2024-03-03 DIAGNOSIS — M7989 Other specified soft tissue disorders: Secondary | ICD-10-CM | POA: Diagnosis not present

## 2024-03-03 DIAGNOSIS — Z992 Dependence on renal dialysis: Secondary | ICD-10-CM | POA: Diagnosis not present

## 2024-03-03 DIAGNOSIS — I483 Typical atrial flutter: Secondary | ICD-10-CM | POA: Diagnosis not present

## 2024-03-03 DIAGNOSIS — K863 Pseudocyst of pancreas: Secondary | ICD-10-CM | POA: Diagnosis not present

## 2024-03-04 DIAGNOSIS — N186 End stage renal disease: Secondary | ICD-10-CM | POA: Diagnosis not present

## 2024-03-04 DIAGNOSIS — N2581 Secondary hyperparathyroidism of renal origin: Secondary | ICD-10-CM | POA: Diagnosis not present

## 2024-03-04 DIAGNOSIS — Z992 Dependence on renal dialysis: Secondary | ICD-10-CM | POA: Diagnosis not present

## 2024-03-04 DIAGNOSIS — D509 Iron deficiency anemia, unspecified: Secondary | ICD-10-CM | POA: Diagnosis not present

## 2024-03-04 DIAGNOSIS — D631 Anemia in chronic kidney disease: Secondary | ICD-10-CM | POA: Diagnosis not present

## 2024-03-05 DIAGNOSIS — Z992 Dependence on renal dialysis: Secondary | ICD-10-CM | POA: Diagnosis not present

## 2024-03-05 DIAGNOSIS — C439 Malignant melanoma of skin, unspecified: Secondary | ICD-10-CM | POA: Diagnosis not present

## 2024-03-05 DIAGNOSIS — Z8719 Personal history of other diseases of the digestive system: Secondary | ICD-10-CM | POA: Diagnosis not present

## 2024-03-05 DIAGNOSIS — Z9289 Personal history of other medical treatment: Secondary | ICD-10-CM | POA: Diagnosis not present

## 2024-03-05 DIAGNOSIS — C434 Malignant melanoma of scalp and neck: Secondary | ICD-10-CM | POA: Diagnosis not present

## 2024-03-05 DIAGNOSIS — R911 Solitary pulmonary nodule: Secondary | ICD-10-CM | POA: Diagnosis not present

## 2024-03-05 DIAGNOSIS — Z79899 Other long term (current) drug therapy: Secondary | ICD-10-CM | POA: Diagnosis not present

## 2024-03-05 DIAGNOSIS — N186 End stage renal disease: Secondary | ICD-10-CM | POA: Diagnosis not present

## 2024-03-05 DIAGNOSIS — D519 Vitamin B12 deficiency anemia, unspecified: Secondary | ICD-10-CM | POA: Diagnosis not present

## 2024-03-09 DIAGNOSIS — H2512 Age-related nuclear cataract, left eye: Secondary | ICD-10-CM | POA: Diagnosis not present

## 2024-03-09 DIAGNOSIS — H401122 Primary open-angle glaucoma, left eye, moderate stage: Secondary | ICD-10-CM | POA: Diagnosis not present

## 2024-03-10 ENCOUNTER — Other Ambulatory Visit (HOSPITAL_COMMUNITY): Payer: Self-pay | Admitting: Internal Medicine

## 2024-03-10 ENCOUNTER — Ambulatory Visit (HOSPITAL_COMMUNITY)
Admission: RE | Admit: 2024-03-10 | Discharge: 2024-03-10 | Disposition: A | Discharge status: Home / Self Care | Source: Ambulatory Visit | Attending: Vascular Surgery | Admitting: Vascular Surgery

## 2024-03-10 DIAGNOSIS — M7989 Other specified soft tissue disorders: Secondary | ICD-10-CM

## 2024-03-11 DIAGNOSIS — D631 Anemia in chronic kidney disease: Secondary | ICD-10-CM | POA: Diagnosis not present

## 2024-03-11 DIAGNOSIS — N186 End stage renal disease: Secondary | ICD-10-CM | POA: Diagnosis not present

## 2024-03-11 DIAGNOSIS — N2581 Secondary hyperparathyroidism of renal origin: Secondary | ICD-10-CM | POA: Diagnosis not present

## 2024-03-11 DIAGNOSIS — Z992 Dependence on renal dialysis: Secondary | ICD-10-CM | POA: Diagnosis not present

## 2024-03-11 DIAGNOSIS — D509 Iron deficiency anemia, unspecified: Secondary | ICD-10-CM | POA: Diagnosis not present

## 2024-03-13 DIAGNOSIS — I5022 Chronic systolic (congestive) heart failure: Secondary | ICD-10-CM | POA: Diagnosis not present

## 2024-03-13 DIAGNOSIS — I129 Hypertensive chronic kidney disease with stage 1 through stage 4 chronic kidney disease, or unspecified chronic kidney disease: Secondary | ICD-10-CM | POA: Diagnosis not present

## 2024-03-13 DIAGNOSIS — I483 Typical atrial flutter: Secondary | ICD-10-CM | POA: Diagnosis not present

## 2024-03-13 DIAGNOSIS — Z992 Dependence on renal dialysis: Secondary | ICD-10-CM | POA: Diagnosis not present

## 2024-03-13 DIAGNOSIS — I700 Atherosclerosis of aorta: Secondary | ICD-10-CM | POA: Diagnosis not present

## 2024-03-13 DIAGNOSIS — N186 End stage renal disease: Secondary | ICD-10-CM | POA: Diagnosis not present

## 2024-03-14 DIAGNOSIS — Z992 Dependence on renal dialysis: Secondary | ICD-10-CM | POA: Diagnosis not present

## 2024-03-14 DIAGNOSIS — I129 Hypertensive chronic kidney disease with stage 1 through stage 4 chronic kidney disease, or unspecified chronic kidney disease: Secondary | ICD-10-CM | POA: Diagnosis not present

## 2024-03-14 DIAGNOSIS — N186 End stage renal disease: Secondary | ICD-10-CM | POA: Diagnosis not present

## 2024-03-16 DIAGNOSIS — D509 Iron deficiency anemia, unspecified: Secondary | ICD-10-CM | POA: Diagnosis not present

## 2024-03-16 DIAGNOSIS — N186 End stage renal disease: Secondary | ICD-10-CM | POA: Diagnosis not present

## 2024-03-16 DIAGNOSIS — N2581 Secondary hyperparathyroidism of renal origin: Secondary | ICD-10-CM | POA: Diagnosis not present

## 2024-03-16 DIAGNOSIS — Z992 Dependence on renal dialysis: Secondary | ICD-10-CM | POA: Diagnosis not present

## 2024-03-16 DIAGNOSIS — D631 Anemia in chronic kidney disease: Secondary | ICD-10-CM | POA: Diagnosis not present

## 2024-03-18 DIAGNOSIS — N186 End stage renal disease: Secondary | ICD-10-CM | POA: Diagnosis not present

## 2024-03-18 DIAGNOSIS — D631 Anemia in chronic kidney disease: Secondary | ICD-10-CM | POA: Diagnosis not present

## 2024-03-18 DIAGNOSIS — N2581 Secondary hyperparathyroidism of renal origin: Secondary | ICD-10-CM | POA: Diagnosis not present

## 2024-03-18 DIAGNOSIS — Z992 Dependence on renal dialysis: Secondary | ICD-10-CM | POA: Diagnosis not present

## 2024-03-18 DIAGNOSIS — D509 Iron deficiency anemia, unspecified: Secondary | ICD-10-CM | POA: Diagnosis not present

## 2024-03-19 DIAGNOSIS — H2511 Age-related nuclear cataract, right eye: Secondary | ICD-10-CM | POA: Diagnosis not present

## 2024-03-20 DIAGNOSIS — N186 End stage renal disease: Secondary | ICD-10-CM | POA: Diagnosis not present

## 2024-03-20 DIAGNOSIS — I700 Atherosclerosis of aorta: Secondary | ICD-10-CM | POA: Diagnosis not present

## 2024-03-20 DIAGNOSIS — I483 Typical atrial flutter: Secondary | ICD-10-CM | POA: Diagnosis not present

## 2024-03-20 DIAGNOSIS — I5022 Chronic systolic (congestive) heart failure: Secondary | ICD-10-CM | POA: Diagnosis not present

## 2024-03-23 DIAGNOSIS — H2511 Age-related nuclear cataract, right eye: Secondary | ICD-10-CM | POA: Diagnosis not present

## 2024-03-23 DIAGNOSIS — H401112 Primary open-angle glaucoma, right eye, moderate stage: Secondary | ICD-10-CM | POA: Diagnosis not present

## 2024-03-24 DIAGNOSIS — L570 Actinic keratosis: Secondary | ICD-10-CM | POA: Diagnosis not present

## 2024-03-24 DIAGNOSIS — D1801 Hemangioma of skin and subcutaneous tissue: Secondary | ICD-10-CM | POA: Diagnosis not present

## 2024-03-24 DIAGNOSIS — Z85820 Personal history of malignant melanoma of skin: Secondary | ICD-10-CM | POA: Diagnosis not present

## 2024-03-24 DIAGNOSIS — L820 Inflamed seborrheic keratosis: Secondary | ICD-10-CM | POA: Diagnosis not present

## 2024-03-24 DIAGNOSIS — Z85828 Personal history of other malignant neoplasm of skin: Secondary | ICD-10-CM | POA: Diagnosis not present

## 2024-03-24 DIAGNOSIS — L821 Other seborrheic keratosis: Secondary | ICD-10-CM | POA: Diagnosis not present

## 2024-03-24 DIAGNOSIS — L812 Freckles: Secondary | ICD-10-CM | POA: Diagnosis not present

## 2024-03-24 DIAGNOSIS — D692 Other nonthrombocytopenic purpura: Secondary | ICD-10-CM | POA: Diagnosis not present

## 2024-03-25 DIAGNOSIS — D631 Anemia in chronic kidney disease: Secondary | ICD-10-CM | POA: Diagnosis not present

## 2024-03-25 DIAGNOSIS — Z992 Dependence on renal dialysis: Secondary | ICD-10-CM | POA: Diagnosis not present

## 2024-03-25 DIAGNOSIS — N2581 Secondary hyperparathyroidism of renal origin: Secondary | ICD-10-CM | POA: Diagnosis not present

## 2024-03-25 DIAGNOSIS — N186 End stage renal disease: Secondary | ICD-10-CM | POA: Diagnosis not present

## 2024-03-25 DIAGNOSIS — D509 Iron deficiency anemia, unspecified: Secondary | ICD-10-CM | POA: Diagnosis not present

## 2024-03-29 ENCOUNTER — Encounter: Payer: Self-pay | Admitting: Hematology and Oncology

## 2024-03-29 ENCOUNTER — Inpatient Hospital Stay: Attending: Hematology and Oncology | Admitting: Hematology and Oncology

## 2024-03-29 ENCOUNTER — Inpatient Hospital Stay

## 2024-03-29 VITALS — BP 115/58 | HR 64 | Temp 97.5°F | Resp 18 | Ht 70.0 in | Wt 172.2 lb

## 2024-03-29 DIAGNOSIS — N186 End stage renal disease: Secondary | ICD-10-CM | POA: Diagnosis not present

## 2024-03-29 DIAGNOSIS — I82532 Chronic embolism and thrombosis of left popliteal vein: Secondary | ICD-10-CM | POA: Insufficient documentation

## 2024-03-29 DIAGNOSIS — Z992 Dependence on renal dialysis: Secondary | ICD-10-CM | POA: Insufficient documentation

## 2024-03-29 DIAGNOSIS — Z7901 Long term (current) use of anticoagulants: Secondary | ICD-10-CM | POA: Diagnosis not present

## 2024-03-29 DIAGNOSIS — R7989 Other specified abnormal findings of blood chemistry: Secondary | ICD-10-CM

## 2024-03-29 DIAGNOSIS — Z85820 Personal history of malignant melanoma of skin: Secondary | ICD-10-CM | POA: Insufficient documentation

## 2024-03-29 DIAGNOSIS — I480 Paroxysmal atrial fibrillation: Secondary | ICD-10-CM | POA: Diagnosis not present

## 2024-03-29 DIAGNOSIS — K529 Noninfective gastroenteritis and colitis, unspecified: Secondary | ICD-10-CM

## 2024-03-29 DIAGNOSIS — Z79899 Other long term (current) drug therapy: Secondary | ICD-10-CM | POA: Diagnosis not present

## 2024-03-29 NOTE — Progress Notes (Signed)
 [name]  Patient Care Team: Benedetta Bradley, MD as PCP - General (Internal Medicine) Audery Blazing Deannie Fabian, MD as PCP - Cardiology (Cardiology)   ASSESSMENT & PLAN:  Chronic deep vein thrombosis (DVT) of popliteal vein of left lower extremity (HCC) I gave the patient a copy of the report The changes seen on the venous ultrasound suggested that it was chronic in nature He does not need long-term anticoagulation therapy  History of melanoma He will continue follow-up as directed by his dermatologist at [facility]  Chronic diarrhea Recommend the patient to discontinue oral iron supplement that could contribute to chronic diarrhea If his diarrhea does not improve with Creon supplement by the end of the month, I recommend referral to his gastroenterologist for management  ESRD on dialysis [facility]) He will continue this as instructed In general, nephrologist will be managing his chronic anemia through the dialysis center   All questions were answered. The patient knows to call the clinic with any problems, questions or concerns. The total time spent in the appointment was 55 minutes encounter with patients including review of chart and various tests results, discussions about plan of care and coordination of care plan  Almeda Jacobs, MD 6/16/20251:21 PM  CHIEF COMPLAINTS/PURPOSE OF CONSULTATION:  Recent findings of chronic DVT  HISTORY OF PRESENTING ILLNESS:  Raymond Hurley. 89 y.o. male is here because of recent findings of chronic DVT The patient is a retired Sport and exercise psychologist History accompanied by his wife, and who provided some history The patient is an excellent historian and A lot of notes He has been quite ill around 2020 and 2021 He was hospitalized extensively between November 10, 2020 to December 07, 2020 with paroxysmal atrial fibrillation, nonischemic cardiomyopathy, end-stage renal disease on hemodialysis, hypotension, severe protein  calorie malnutrition, etc. I suspect he might have developed DVT then He was placed on Eliquis  for some time but developed GI bleed and that was subsequently discontinued  Most recently, he complained of bilateral lower extremity achiness and pain. Venous Doppler ultrasound was ordered on 03/10/2024 which showed chronic deep venous thrombosis involving the left distal femoral vein and popliteal vein. However, this is not consistent with the description of pain He stated, he complained of cold extremities on his lower legs for the past 6 months and deep achiness in his bones He has mild chronic lower extremity swelling with venous stasis changes He denies recent history of trauma, long distance travel, dehydration, recent surgery, smoking or prolonged immobilization. He has hemodialysis on Tuesdays and Thursday He has significant history of melanoma and skin cancer, treated with surgery and radiation treatment The patient also developed history of pancreatitis, with most recent CT imaging done at [facility] which showed some calcifications on his pancreas He has intermittent chronic diarrhea alternate with constipation and recently started Creon   MEDICAL HISTORY:  Past Medical History:  Diagnosis Date   Acute pancreatitis    Atrial fibrillation (HCC)    CAD (coronary artery disease) 04/2019   CHF (congestive heart failure) (HCC)    Chronic kidney disease    Melanoma (HCC)    Vitamin B 12 deficiency     SURGICAL HISTORY: Past Surgical History:  Procedure Laterality Date   CARDIOVERSION N/A 06/02/2019   Procedure: CARDIOVERSION;  Surgeon: Hugh Madura, MD;  Location: [facility] ENDOSCOPY;  Service: Cardiovascular;  Laterality: N/A;   IR FLUORO GUIDE CV LINE RIGHT  11/06/2020   IR US  GUIDE VASC ACCESS RIGHT  11/06/2020   RIGHT/LEFT HEART CATH AND CORONARY ANGIOGRAPHY N/A 04/27/2019   Procedure: RIGHT/LEFT HEART CATH AND CORONARY ANGIOGRAPHY;  Surgeon: Mardell Shade, MD;  Location: [facility]  INVASIVE CV LAB;  Service: Cardiovascular;  Laterality: N/A;   TEE WITHOUT CARDIOVERSION N/A 06/02/2019   Procedure: TRANSESOPHAGEAL ECHOCARDIOGRAM (TEE);  Surgeon: Hugh Madura, MD;  Location: [facility] ENDOSCOPY;  Service: Cardiovascular;  Laterality: N/A;    SOCIAL HISTORY: Social History   Socioeconomic History   Marital status: Married    Spouse name: Not on file   Number of children: Not on file   Years of education: Not on file   Highest education level: Not on file  Occupational History   Not on file  Tobacco Use   Smoking status: Never   Smokeless tobacco: Never  Vaping Use   Vaping status: Never Used  Substance and Sexual Activity   Alcohol use: Yes    Alcohol/week: 7.0 standard drinks of alcohol    Types: 7 Glasses of wine per week   Drug use: Never   Sexual activity: Not Currently  Other Topics Concern   Not on file  Social History Narrative   Not on file   Social Drivers of Health   Financial Resource Strain: Not on file  Food Insecurity: Not on file  Transportation Needs: Not on file  Physical Activity: Not on file  Stress: Not on file  Social Connections: Unknown (02/24/2022)   Received from [facility]   Social Network    Social Network: Not on file  Intimate Partner Violence: Unknown (01/16/2022)   Received from [facility]   HITS    Physically Hurt: Not on file    Insult or Talk Down To: Not on file    Threaten Physical Harm: Not on file    Scream or Curse: Not on file    FAMILY HISTORY: Family History  Problem Relation Age of Onset   Melanoma Daughter     ALLERGIES:  is allergic to tape and bee venom.  MEDICATIONS:  Current Outpatient Medications  Medication Sig Dispense Refill   CREON 24000-76000 units CPEP Take 24,000 Units by mouth 3 (three) times daily with meals. And 12,000 unit with snack     loperamide  (IMODIUM ) 2 MG capsule Take 2 mg by mouth as needed for diarrhea or loose stools.     pantoprazole  (PROTONIX ) 40 MG tablet Take 40  mg by mouth daily.     amiodarone  (PACERONE ) 200 MG tablet Take one pill twice a day till 02/25. Starting on 02/26 decrease to one pill daily. (Patient taking differently: Take 200 mg by mouth daily.) 35 tablet 0   amLODipine (NORVASC) 2.5 MG tablet Take 1 mg by mouth daily.     B Complex-C-Folic Acid  (RENAL VITAMIN) 0.8 MG TABS Take 1 tablet by mouth at bedtime.     docusate sodium  (COLACE) 100 MG capsule Take 100 mg by mouth 2 (two) times daily.     fluticasone (FLONASE) 50 MCG/ACT nasal spray Place into both nostrils daily.     levothyroxine  (SYNTHROID ) 75 MCG tablet Take 1 tablet (75 mcg total) by mouth daily before breakfast. 90 tablet 3   polyethylene glycol powder (GLYCOLAX /MIRALAX ) 17 GM/SCOOP powder Take 17 g by mouth daily as needed.     No current facility-administered medications for this visit.    REVIEW OF SYSTEMS:   All other systems were reviewed with the patient and are negative.  PHYSICAL EXAMINATION: ECOG PERFORMANCE STATUS: 1 - Symptomatic but completely  ambulatory  Vitals:   03/29/24 1202  BP: (!) 115/58  Pulse: 64  Resp: 18  Temp: (!) 97.5 F (36.4 C)  SpO2: 95%   Filed Weights   03/29/24 1202  Weight: 172 lb 3.2 oz (78.1 kg)    GENERAL:alert, no distress and comfortable SKIN: Noted extensive skin bruises as well as skin lesions on his scalp consistent with seborrheic keratosis HEART: regular rate & rhythm and no murmurs with mild bilateral lower extremity edema PSYCH: alert & oriented x 3 with fluent speech NEURO: no focal motor/sensory deficits  LABORATORY DATA:  I have reviewed the data as listed Lab Results  Component Value Date   WBC 3.2 (L) 07/23/2021   HGB 7.0 (L) 07/23/2021   HCT 22.0 (L) 07/23/2021   MCV 112.8 (H) 07/23/2021   PLT 256 07/23/2021    I have also reviewed documentation and imaging studies from [facility] dated back to May 2025 which showed *  No new or progressive disease.  *  Slightly increased patchy groundglass  opacities and septal thickening in lung bases with increased nodular consolidation in the anterior right lower lobe. Findings are likely secondary to aspiration or infection.  *  Similar peripancreatic collections.   RADIOGRAPHIC STUDIES: I have personally reviewed the radiological images as listed and agreed with the findings in the report. VAS US  LOWER EXTREMITY VENOUS (DVT) Result Date: 03/10/2024  Lower Venous DVT Study Patient Name:  DIMITRIUS STEEDMAN JR.  Date of Exam:   03/10/2024 Medical Rec #: [id number]                   Accession #:    [id number] Date of Birth: [birth year]                   Patient Gender: M Patient Age:   41 years Exam Location:  [address] Procedure:      VAS US  LOWER EXTREMITY VENOUS (DVT) Referring Phys: Arlyce Lambert HENDERSON --------------------------------------------------------------------------------  Indications: Left leg pain.  Performing Technologist: Parke Boll RVS, RCS  Examination Guidelines: A complete evaluation includes B-mode imaging, spectral Doppler, color Doppler, and power Doppler as needed of all accessible portions of each vessel. Bilateral testing is considered an integral part of a complete examination. Limited examinations for reoccurring indications may be performed as noted. The reflux portion of the exam is performed with the patient in reverse Trendelenburg.  +---------+---------------+---------+-----------+----------+--------------+ LEFT     CompressibilityPhasicitySpontaneityPropertiesThrombus Aging +---------+---------------+---------+-----------+----------+--------------+ CFV      Full                                                        +---------+---------------+---------+-----------+----------+--------------+ SFJ      Full                                                        +---------+---------------+---------+-----------+----------+--------------+ FV Prox  Full                                                         +---------+---------------+---------+-----------+----------+--------------+  FV Mid   Full                                                        +---------+---------------+---------+-----------+----------+--------------+ FV DistalPartial                                      Chronic        +---------+---------------+---------+-----------+----------+--------------+ POP      Partial                                      Chronic        +---------+---------------+---------+-----------+----------+--------------+ PTV      Full                                                        +---------+---------------+---------+-----------+----------+--------------+ PERO     Full                                                        +---------+---------------+---------+-----------+----------+--------------+ GSV      Full                                                        +---------+---------------+---------+-----------+----------+--------------+    Findings reported to report routed in epic.  Summary: RIGHT: - No evidence of common femoral vein obstruction.  LEFT: - Findings consistent with chronic deep vein thrombosis involving the left distal femoral vein and proximal popliteal vein.   *See table(s) above for measurements and observations. Electronically signed by Runell Countryman on 03/10/2024 at 4:11:25 PM.    Final

## 2024-03-29 NOTE — Assessment & Plan Note (Signed)
 He will continue this as instructed In general, nephrologist will be managing his chronic anemia through the dialysis center

## 2024-03-29 NOTE — Assessment & Plan Note (Signed)
 Recommend the patient to discontinue oral iron supplement that could contribute to chronic diarrhea If his diarrhea does not improve with Creon supplement by the end of the month, I recommend referral to his gastroenterologist for management

## 2024-03-29 NOTE — Assessment & Plan Note (Signed)
 He will continue follow-up as directed by his dermatologist at [facility]

## 2024-03-29 NOTE — Assessment & Plan Note (Signed)
 I gave the patient a copy of the report The changes seen on the venous ultrasound suggested that it was chronic in nature He does not need long-term anticoagulation therapy

## 2024-03-30 DIAGNOSIS — N2581 Secondary hyperparathyroidism of renal origin: Secondary | ICD-10-CM | POA: Diagnosis not present

## 2024-03-30 DIAGNOSIS — D631 Anemia in chronic kidney disease: Secondary | ICD-10-CM | POA: Diagnosis not present

## 2024-03-30 DIAGNOSIS — Z992 Dependence on renal dialysis: Secondary | ICD-10-CM | POA: Diagnosis not present

## 2024-03-30 DIAGNOSIS — N186 End stage renal disease: Secondary | ICD-10-CM | POA: Diagnosis not present

## 2024-03-30 DIAGNOSIS — D509 Iron deficiency anemia, unspecified: Secondary | ICD-10-CM | POA: Diagnosis not present

## 2024-04-01 DIAGNOSIS — N186 End stage renal disease: Secondary | ICD-10-CM | POA: Diagnosis not present

## 2024-04-01 DIAGNOSIS — N2581 Secondary hyperparathyroidism of renal origin: Secondary | ICD-10-CM | POA: Diagnosis not present

## 2024-04-01 DIAGNOSIS — D631 Anemia in chronic kidney disease: Secondary | ICD-10-CM | POA: Diagnosis not present

## 2024-04-01 DIAGNOSIS — D509 Iron deficiency anemia, unspecified: Secondary | ICD-10-CM | POA: Diagnosis not present

## 2024-04-01 DIAGNOSIS — Z992 Dependence on renal dialysis: Secondary | ICD-10-CM | POA: Diagnosis not present

## 2024-04-06 ENCOUNTER — Other Ambulatory Visit: Payer: Self-pay | Admitting: Physician Assistant

## 2024-04-06 ENCOUNTER — Ambulatory Visit
Admission: RE | Admit: 2024-04-06 | Discharge: 2024-04-06 | Disposition: A | Discharge status: Home / Self Care | Source: Ambulatory Visit | Attending: Physician Assistant | Admitting: Physician Assistant

## 2024-04-06 DIAGNOSIS — L0390 Cellulitis, unspecified: Secondary | ICD-10-CM | POA: Diagnosis not present

## 2024-04-06 DIAGNOSIS — L03116 Cellulitis of left lower limb: Secondary | ICD-10-CM | POA: Diagnosis not present

## 2024-04-06 DIAGNOSIS — N186 End stage renal disease: Secondary | ICD-10-CM | POA: Diagnosis not present

## 2024-04-06 DIAGNOSIS — D631 Anemia in chronic kidney disease: Secondary | ICD-10-CM | POA: Diagnosis not present

## 2024-04-06 DIAGNOSIS — N2581 Secondary hyperparathyroidism of renal origin: Secondary | ICD-10-CM | POA: Diagnosis not present

## 2024-04-06 DIAGNOSIS — Z992 Dependence on renal dialysis: Secondary | ICD-10-CM | POA: Diagnosis not present

## 2024-04-06 DIAGNOSIS — D509 Iron deficiency anemia, unspecified: Secondary | ICD-10-CM | POA: Diagnosis not present

## 2024-04-08 DIAGNOSIS — N186 End stage renal disease: Secondary | ICD-10-CM | POA: Diagnosis not present

## 2024-04-08 DIAGNOSIS — D509 Iron deficiency anemia, unspecified: Secondary | ICD-10-CM | POA: Diagnosis not present

## 2024-04-08 DIAGNOSIS — D631 Anemia in chronic kidney disease: Secondary | ICD-10-CM | POA: Diagnosis not present

## 2024-04-08 DIAGNOSIS — N2581 Secondary hyperparathyroidism of renal origin: Secondary | ICD-10-CM | POA: Diagnosis not present

## 2024-04-08 DIAGNOSIS — Z992 Dependence on renal dialysis: Secondary | ICD-10-CM | POA: Diagnosis not present

## 2024-04-12 DIAGNOSIS — I5022 Chronic systolic (congestive) heart failure: Secondary | ICD-10-CM | POA: Diagnosis not present

## 2024-04-12 DIAGNOSIS — I700 Atherosclerosis of aorta: Secondary | ICD-10-CM | POA: Diagnosis not present

## 2024-04-12 DIAGNOSIS — N186 End stage renal disease: Secondary | ICD-10-CM | POA: Diagnosis not present

## 2024-04-12 DIAGNOSIS — I483 Typical atrial flutter: Secondary | ICD-10-CM | POA: Diagnosis not present

## 2024-04-13 DIAGNOSIS — N2581 Secondary hyperparathyroidism of renal origin: Secondary | ICD-10-CM | POA: Diagnosis not present

## 2024-04-13 DIAGNOSIS — N186 End stage renal disease: Secondary | ICD-10-CM | POA: Diagnosis not present

## 2024-04-13 DIAGNOSIS — D631 Anemia in chronic kidney disease: Secondary | ICD-10-CM | POA: Diagnosis not present

## 2024-04-13 DIAGNOSIS — I129 Hypertensive chronic kidney disease with stage 1 through stage 4 chronic kidney disease, or unspecified chronic kidney disease: Secondary | ICD-10-CM | POA: Diagnosis not present

## 2024-04-13 DIAGNOSIS — Z992 Dependence on renal dialysis: Secondary | ICD-10-CM | POA: Diagnosis not present

## 2024-04-13 DIAGNOSIS — D509 Iron deficiency anemia, unspecified: Secondary | ICD-10-CM | POA: Diagnosis not present

## 2024-04-14 DIAGNOSIS — L0390 Cellulitis, unspecified: Secondary | ICD-10-CM | POA: Diagnosis not present

## 2024-04-15 DIAGNOSIS — N186 End stage renal disease: Secondary | ICD-10-CM | POA: Diagnosis not present

## 2024-04-15 DIAGNOSIS — D509 Iron deficiency anemia, unspecified: Secondary | ICD-10-CM | POA: Diagnosis not present

## 2024-04-15 DIAGNOSIS — N2581 Secondary hyperparathyroidism of renal origin: Secondary | ICD-10-CM | POA: Diagnosis not present

## 2024-04-15 DIAGNOSIS — Z992 Dependence on renal dialysis: Secondary | ICD-10-CM | POA: Diagnosis not present

## 2024-04-15 DIAGNOSIS — D631 Anemia in chronic kidney disease: Secondary | ICD-10-CM | POA: Diagnosis not present

## 2024-04-19 DIAGNOSIS — N186 End stage renal disease: Secondary | ICD-10-CM | POA: Diagnosis not present

## 2024-04-19 DIAGNOSIS — I700 Atherosclerosis of aorta: Secondary | ICD-10-CM | POA: Diagnosis not present

## 2024-04-19 DIAGNOSIS — I5022 Chronic systolic (congestive) heart failure: Secondary | ICD-10-CM | POA: Diagnosis not present

## 2024-04-19 DIAGNOSIS — I483 Typical atrial flutter: Secondary | ICD-10-CM | POA: Diagnosis not present

## 2024-04-20 DIAGNOSIS — N2581 Secondary hyperparathyroidism of renal origin: Secondary | ICD-10-CM | POA: Diagnosis not present

## 2024-04-20 DIAGNOSIS — Z992 Dependence on renal dialysis: Secondary | ICD-10-CM | POA: Diagnosis not present

## 2024-04-20 DIAGNOSIS — N186 End stage renal disease: Secondary | ICD-10-CM | POA: Diagnosis not present

## 2024-04-20 DIAGNOSIS — D631 Anemia in chronic kidney disease: Secondary | ICD-10-CM | POA: Diagnosis not present

## 2024-04-20 DIAGNOSIS — D509 Iron deficiency anemia, unspecified: Secondary | ICD-10-CM | POA: Diagnosis not present

## 2024-04-22 DIAGNOSIS — N186 End stage renal disease: Secondary | ICD-10-CM | POA: Diagnosis not present

## 2024-04-22 DIAGNOSIS — N2581 Secondary hyperparathyroidism of renal origin: Secondary | ICD-10-CM | POA: Diagnosis not present

## 2024-04-22 DIAGNOSIS — D509 Iron deficiency anemia, unspecified: Secondary | ICD-10-CM | POA: Diagnosis not present

## 2024-04-22 DIAGNOSIS — Z992 Dependence on renal dialysis: Secondary | ICD-10-CM | POA: Diagnosis not present

## 2024-04-22 DIAGNOSIS — D631 Anemia in chronic kidney disease: Secondary | ICD-10-CM | POA: Diagnosis not present

## 2024-04-27 DIAGNOSIS — D631 Anemia in chronic kidney disease: Secondary | ICD-10-CM | POA: Diagnosis not present

## 2024-04-27 DIAGNOSIS — Z992 Dependence on renal dialysis: Secondary | ICD-10-CM | POA: Diagnosis not present

## 2024-04-27 DIAGNOSIS — N186 End stage renal disease: Secondary | ICD-10-CM | POA: Diagnosis not present

## 2024-04-27 DIAGNOSIS — D509 Iron deficiency anemia, unspecified: Secondary | ICD-10-CM | POA: Diagnosis not present

## 2024-04-27 DIAGNOSIS — N2581 Secondary hyperparathyroidism of renal origin: Secondary | ICD-10-CM | POA: Diagnosis not present

## 2024-04-29 DIAGNOSIS — N186 End stage renal disease: Secondary | ICD-10-CM | POA: Diagnosis not present

## 2024-04-29 DIAGNOSIS — D509 Iron deficiency anemia, unspecified: Secondary | ICD-10-CM | POA: Diagnosis not present

## 2024-04-29 DIAGNOSIS — Z992 Dependence on renal dialysis: Secondary | ICD-10-CM | POA: Diagnosis not present

## 2024-04-29 DIAGNOSIS — N2581 Secondary hyperparathyroidism of renal origin: Secondary | ICD-10-CM | POA: Diagnosis not present

## 2024-04-29 DIAGNOSIS — D631 Anemia in chronic kidney disease: Secondary | ICD-10-CM | POA: Diagnosis not present

## 2024-04-30 DIAGNOSIS — Z9089 Acquired absence of other organs: Secondary | ICD-10-CM | POA: Diagnosis not present

## 2024-04-30 DIAGNOSIS — D631 Anemia in chronic kidney disease: Secondary | ICD-10-CM | POA: Diagnosis not present

## 2024-04-30 DIAGNOSIS — N186 End stage renal disease: Secondary | ICD-10-CM | POA: Diagnosis not present

## 2024-04-30 DIAGNOSIS — I509 Heart failure, unspecified: Secondary | ICD-10-CM | POA: Diagnosis not present

## 2024-04-30 DIAGNOSIS — Z888 Allergy status to other drugs, medicaments and biological substances status: Secondary | ICD-10-CM | POA: Diagnosis not present

## 2024-04-30 DIAGNOSIS — D649 Anemia, unspecified: Secondary | ICD-10-CM | POA: Diagnosis not present

## 2024-04-30 DIAGNOSIS — Z91030 Bee allergy status: Secondary | ICD-10-CM | POA: Diagnosis not present

## 2024-04-30 DIAGNOSIS — Z7982 Long term (current) use of aspirin: Secondary | ICD-10-CM | POA: Diagnosis not present

## 2024-04-30 DIAGNOSIS — Z7901 Long term (current) use of anticoagulants: Secondary | ICD-10-CM | POA: Diagnosis not present

## 2024-04-30 DIAGNOSIS — Z992 Dependence on renal dialysis: Secondary | ICD-10-CM | POA: Diagnosis not present

## 2024-04-30 DIAGNOSIS — I120 Hypertensive chronic kidney disease with stage 5 chronic kidney disease or end stage renal disease: Secondary | ICD-10-CM | POA: Diagnosis not present

## 2024-04-30 DIAGNOSIS — I4891 Unspecified atrial fibrillation: Secondary | ICD-10-CM | POA: Diagnosis not present

## 2024-04-30 DIAGNOSIS — Z91040 Latex allergy status: Secondary | ICD-10-CM | POA: Diagnosis not present

## 2024-04-30 DIAGNOSIS — Z79899 Other long term (current) drug therapy: Secondary | ICD-10-CM | POA: Diagnosis not present

## 2024-05-04 DIAGNOSIS — D509 Iron deficiency anemia, unspecified: Secondary | ICD-10-CM | POA: Diagnosis not present

## 2024-05-04 DIAGNOSIS — N2581 Secondary hyperparathyroidism of renal origin: Secondary | ICD-10-CM | POA: Diagnosis not present

## 2024-05-04 DIAGNOSIS — N186 End stage renal disease: Secondary | ICD-10-CM | POA: Diagnosis not present

## 2024-05-04 DIAGNOSIS — D631 Anemia in chronic kidney disease: Secondary | ICD-10-CM | POA: Diagnosis not present

## 2024-05-04 DIAGNOSIS — Z992 Dependence on renal dialysis: Secondary | ICD-10-CM | POA: Diagnosis not present

## 2024-05-05 DIAGNOSIS — N186 End stage renal disease: Secondary | ICD-10-CM | POA: Diagnosis not present

## 2024-05-05 DIAGNOSIS — I82532 Chronic embolism and thrombosis of left popliteal vein: Secondary | ICD-10-CM | POA: Diagnosis not present

## 2024-05-05 DIAGNOSIS — L0390 Cellulitis, unspecified: Secondary | ICD-10-CM | POA: Diagnosis not present

## 2024-05-05 DIAGNOSIS — D649 Anemia, unspecified: Secondary | ICD-10-CM | POA: Diagnosis not present

## 2024-05-05 DIAGNOSIS — R9431 Abnormal electrocardiogram [ECG] [EKG]: Secondary | ICD-10-CM | POA: Diagnosis not present

## 2024-05-06 DIAGNOSIS — D631 Anemia in chronic kidney disease: Secondary | ICD-10-CM | POA: Diagnosis not present

## 2024-05-06 DIAGNOSIS — D509 Iron deficiency anemia, unspecified: Secondary | ICD-10-CM | POA: Diagnosis not present

## 2024-05-06 DIAGNOSIS — N2581 Secondary hyperparathyroidism of renal origin: Secondary | ICD-10-CM | POA: Diagnosis not present

## 2024-05-06 DIAGNOSIS — Z992 Dependence on renal dialysis: Secondary | ICD-10-CM | POA: Diagnosis not present

## 2024-05-06 DIAGNOSIS — N186 End stage renal disease: Secondary | ICD-10-CM | POA: Diagnosis not present

## 2024-05-11 DIAGNOSIS — D509 Iron deficiency anemia, unspecified: Secondary | ICD-10-CM | POA: Diagnosis not present

## 2024-05-11 DIAGNOSIS — N2581 Secondary hyperparathyroidism of renal origin: Secondary | ICD-10-CM | POA: Diagnosis not present

## 2024-05-11 DIAGNOSIS — N186 End stage renal disease: Secondary | ICD-10-CM | POA: Diagnosis not present

## 2024-05-11 DIAGNOSIS — Z992 Dependence on renal dialysis: Secondary | ICD-10-CM | POA: Diagnosis not present

## 2024-05-11 DIAGNOSIS — D631 Anemia in chronic kidney disease: Secondary | ICD-10-CM | POA: Diagnosis not present

## 2024-05-13 DIAGNOSIS — I483 Typical atrial flutter: Secondary | ICD-10-CM | POA: Diagnosis not present

## 2024-05-13 DIAGNOSIS — I700 Atherosclerosis of aorta: Secondary | ICD-10-CM | POA: Diagnosis not present

## 2024-05-13 DIAGNOSIS — N2581 Secondary hyperparathyroidism of renal origin: Secondary | ICD-10-CM | POA: Diagnosis not present

## 2024-05-13 DIAGNOSIS — N186 End stage renal disease: Secondary | ICD-10-CM | POA: Diagnosis not present

## 2024-05-13 DIAGNOSIS — D631 Anemia in chronic kidney disease: Secondary | ICD-10-CM | POA: Diagnosis not present

## 2024-05-13 DIAGNOSIS — Z992 Dependence on renal dialysis: Secondary | ICD-10-CM | POA: Diagnosis not present

## 2024-05-13 DIAGNOSIS — D509 Iron deficiency anemia, unspecified: Secondary | ICD-10-CM | POA: Diagnosis not present

## 2024-05-13 DIAGNOSIS — I5022 Chronic systolic (congestive) heart failure: Secondary | ICD-10-CM | POA: Diagnosis not present

## 2024-05-14 DIAGNOSIS — I129 Hypertensive chronic kidney disease with stage 1 through stage 4 chronic kidney disease, or unspecified chronic kidney disease: Secondary | ICD-10-CM | POA: Diagnosis not present

## 2024-05-14 DIAGNOSIS — Z992 Dependence on renal dialysis: Secondary | ICD-10-CM | POA: Diagnosis not present

## 2024-05-14 DIAGNOSIS — N186 End stage renal disease: Secondary | ICD-10-CM | POA: Diagnosis not present

## 2024-05-18 DIAGNOSIS — N186 End stage renal disease: Secondary | ICD-10-CM | POA: Diagnosis not present

## 2024-05-18 DIAGNOSIS — D631 Anemia in chronic kidney disease: Secondary | ICD-10-CM | POA: Diagnosis not present

## 2024-05-18 DIAGNOSIS — Z992 Dependence on renal dialysis: Secondary | ICD-10-CM | POA: Diagnosis not present

## 2024-05-18 DIAGNOSIS — D509 Iron deficiency anemia, unspecified: Secondary | ICD-10-CM | POA: Diagnosis not present

## 2024-05-18 DIAGNOSIS — N2581 Secondary hyperparathyroidism of renal origin: Secondary | ICD-10-CM | POA: Diagnosis not present

## 2024-05-19 DIAGNOSIS — I700 Atherosclerosis of aorta: Secondary | ICD-10-CM | POA: Diagnosis not present

## 2024-05-19 DIAGNOSIS — I5022 Chronic systolic (congestive) heart failure: Secondary | ICD-10-CM | POA: Diagnosis not present

## 2024-05-19 DIAGNOSIS — I483 Typical atrial flutter: Secondary | ICD-10-CM | POA: Diagnosis not present

## 2024-05-19 DIAGNOSIS — N186 End stage renal disease: Secondary | ICD-10-CM | POA: Diagnosis not present

## 2024-05-20 DIAGNOSIS — D631 Anemia in chronic kidney disease: Secondary | ICD-10-CM | POA: Diagnosis not present

## 2024-05-20 DIAGNOSIS — D509 Iron deficiency anemia, unspecified: Secondary | ICD-10-CM | POA: Diagnosis not present

## 2024-05-20 DIAGNOSIS — N186 End stage renal disease: Secondary | ICD-10-CM | POA: Diagnosis not present

## 2024-05-20 DIAGNOSIS — Z992 Dependence on renal dialysis: Secondary | ICD-10-CM | POA: Diagnosis not present

## 2024-05-20 DIAGNOSIS — N2581 Secondary hyperparathyroidism of renal origin: Secondary | ICD-10-CM | POA: Diagnosis not present

## 2024-05-25 DIAGNOSIS — N2581 Secondary hyperparathyroidism of renal origin: Secondary | ICD-10-CM | POA: Diagnosis not present

## 2024-05-25 DIAGNOSIS — D631 Anemia in chronic kidney disease: Secondary | ICD-10-CM | POA: Diagnosis not present

## 2024-05-25 DIAGNOSIS — Z992 Dependence on renal dialysis: Secondary | ICD-10-CM | POA: Diagnosis not present

## 2024-05-25 DIAGNOSIS — D509 Iron deficiency anemia, unspecified: Secondary | ICD-10-CM | POA: Diagnosis not present

## 2024-05-25 DIAGNOSIS — N186 End stage renal disease: Secondary | ICD-10-CM | POA: Diagnosis not present

## 2024-05-26 DIAGNOSIS — L821 Other seborrheic keratosis: Secondary | ICD-10-CM | POA: Diagnosis not present

## 2024-05-26 DIAGNOSIS — D1801 Hemangioma of skin and subcutaneous tissue: Secondary | ICD-10-CM | POA: Diagnosis not present

## 2024-05-26 DIAGNOSIS — Z85828 Personal history of other malignant neoplasm of skin: Secondary | ICD-10-CM | POA: Diagnosis not present

## 2024-05-26 DIAGNOSIS — D045 Carcinoma in situ of skin of trunk: Secondary | ICD-10-CM | POA: Diagnosis not present

## 2024-05-26 DIAGNOSIS — L812 Freckles: Secondary | ICD-10-CM | POA: Diagnosis not present

## 2024-05-26 DIAGNOSIS — C44619 Basal cell carcinoma of skin of left upper limb, including shoulder: Secondary | ICD-10-CM | POA: Diagnosis not present

## 2024-05-26 DIAGNOSIS — D485 Neoplasm of uncertain behavior of skin: Secondary | ICD-10-CM | POA: Diagnosis not present

## 2024-05-26 DIAGNOSIS — Z85820 Personal history of malignant melanoma of skin: Secondary | ICD-10-CM | POA: Diagnosis not present

## 2024-05-27 DIAGNOSIS — Z992 Dependence on renal dialysis: Secondary | ICD-10-CM | POA: Diagnosis not present

## 2024-05-27 DIAGNOSIS — N2581 Secondary hyperparathyroidism of renal origin: Secondary | ICD-10-CM | POA: Diagnosis not present

## 2024-05-27 DIAGNOSIS — D631 Anemia in chronic kidney disease: Secondary | ICD-10-CM | POA: Diagnosis not present

## 2024-05-27 DIAGNOSIS — D509 Iron deficiency anemia, unspecified: Secondary | ICD-10-CM | POA: Diagnosis not present

## 2024-05-27 DIAGNOSIS — N186 End stage renal disease: Secondary | ICD-10-CM | POA: Diagnosis not present

## 2024-06-01 DIAGNOSIS — Z992 Dependence on renal dialysis: Secondary | ICD-10-CM | POA: Diagnosis not present

## 2024-06-01 DIAGNOSIS — D509 Iron deficiency anemia, unspecified: Secondary | ICD-10-CM | POA: Diagnosis not present

## 2024-06-01 DIAGNOSIS — N2581 Secondary hyperparathyroidism of renal origin: Secondary | ICD-10-CM | POA: Diagnosis not present

## 2024-06-01 DIAGNOSIS — D631 Anemia in chronic kidney disease: Secondary | ICD-10-CM | POA: Diagnosis not present

## 2024-06-01 DIAGNOSIS — N186 End stage renal disease: Secondary | ICD-10-CM | POA: Diagnosis not present

## 2024-06-03 DIAGNOSIS — D509 Iron deficiency anemia, unspecified: Secondary | ICD-10-CM | POA: Diagnosis not present

## 2024-06-03 DIAGNOSIS — N186 End stage renal disease: Secondary | ICD-10-CM | POA: Diagnosis not present

## 2024-06-03 DIAGNOSIS — Z992 Dependence on renal dialysis: Secondary | ICD-10-CM | POA: Diagnosis not present

## 2024-06-03 DIAGNOSIS — D631 Anemia in chronic kidney disease: Secondary | ICD-10-CM | POA: Diagnosis not present

## 2024-06-03 DIAGNOSIS — N2581 Secondary hyperparathyroidism of renal origin: Secondary | ICD-10-CM | POA: Diagnosis not present

## 2024-06-08 DIAGNOSIS — N186 End stage renal disease: Secondary | ICD-10-CM | POA: Diagnosis not present

## 2024-06-08 DIAGNOSIS — D631 Anemia in chronic kidney disease: Secondary | ICD-10-CM | POA: Diagnosis not present

## 2024-06-08 DIAGNOSIS — D509 Iron deficiency anemia, unspecified: Secondary | ICD-10-CM | POA: Diagnosis not present

## 2024-06-08 DIAGNOSIS — Z992 Dependence on renal dialysis: Secondary | ICD-10-CM | POA: Diagnosis not present

## 2024-06-08 DIAGNOSIS — N2581 Secondary hyperparathyroidism of renal origin: Secondary | ICD-10-CM | POA: Diagnosis not present

## 2024-06-09 DIAGNOSIS — D631 Anemia in chronic kidney disease: Secondary | ICD-10-CM | POA: Diagnosis not present

## 2024-06-09 DIAGNOSIS — L03119 Cellulitis of unspecified part of limb: Secondary | ICD-10-CM | POA: Diagnosis not present

## 2024-06-09 DIAGNOSIS — Z992 Dependence on renal dialysis: Secondary | ICD-10-CM | POA: Diagnosis not present

## 2024-06-10 DIAGNOSIS — N186 End stage renal disease: Secondary | ICD-10-CM | POA: Diagnosis not present

## 2024-06-10 DIAGNOSIS — D631 Anemia in chronic kidney disease: Secondary | ICD-10-CM | POA: Diagnosis not present

## 2024-06-10 DIAGNOSIS — N2581 Secondary hyperparathyroidism of renal origin: Secondary | ICD-10-CM | POA: Diagnosis not present

## 2024-06-10 DIAGNOSIS — Z992 Dependence on renal dialysis: Secondary | ICD-10-CM | POA: Diagnosis not present

## 2024-06-10 DIAGNOSIS — D509 Iron deficiency anemia, unspecified: Secondary | ICD-10-CM | POA: Diagnosis not present

## 2024-06-14 DIAGNOSIS — I129 Hypertensive chronic kidney disease with stage 1 through stage 4 chronic kidney disease, or unspecified chronic kidney disease: Secondary | ICD-10-CM | POA: Diagnosis not present

## 2024-06-15 DIAGNOSIS — Z992 Dependence on renal dialysis: Secondary | ICD-10-CM | POA: Diagnosis not present

## 2024-06-15 DIAGNOSIS — D631 Anemia in chronic kidney disease: Secondary | ICD-10-CM | POA: Diagnosis not present

## 2024-06-15 DIAGNOSIS — D509 Iron deficiency anemia, unspecified: Secondary | ICD-10-CM | POA: Diagnosis not present

## 2024-06-15 DIAGNOSIS — N186 End stage renal disease: Secondary | ICD-10-CM | POA: Diagnosis not present

## 2024-06-15 DIAGNOSIS — N2581 Secondary hyperparathyroidism of renal origin: Secondary | ICD-10-CM | POA: Diagnosis not present

## 2024-06-17 DIAGNOSIS — D509 Iron deficiency anemia, unspecified: Secondary | ICD-10-CM | POA: Diagnosis not present

## 2024-06-17 DIAGNOSIS — Z992 Dependence on renal dialysis: Secondary | ICD-10-CM | POA: Diagnosis not present

## 2024-06-17 DIAGNOSIS — N186 End stage renal disease: Secondary | ICD-10-CM | POA: Diagnosis not present

## 2024-06-17 DIAGNOSIS — N2581 Secondary hyperparathyroidism of renal origin: Secondary | ICD-10-CM | POA: Diagnosis not present

## 2024-06-17 DIAGNOSIS — D631 Anemia in chronic kidney disease: Secondary | ICD-10-CM | POA: Diagnosis not present

## 2024-06-22 DIAGNOSIS — N186 End stage renal disease: Secondary | ICD-10-CM | POA: Diagnosis not present

## 2024-06-22 DIAGNOSIS — D509 Iron deficiency anemia, unspecified: Secondary | ICD-10-CM | POA: Diagnosis not present

## 2024-06-22 DIAGNOSIS — D631 Anemia in chronic kidney disease: Secondary | ICD-10-CM | POA: Diagnosis not present

## 2024-06-22 DIAGNOSIS — N2581 Secondary hyperparathyroidism of renal origin: Secondary | ICD-10-CM | POA: Diagnosis not present

## 2024-06-22 DIAGNOSIS — Z992 Dependence on renal dialysis: Secondary | ICD-10-CM | POA: Diagnosis not present

## 2024-06-24 DIAGNOSIS — N2581 Secondary hyperparathyroidism of renal origin: Secondary | ICD-10-CM | POA: Diagnosis not present

## 2024-06-24 DIAGNOSIS — D631 Anemia in chronic kidney disease: Secondary | ICD-10-CM | POA: Diagnosis not present

## 2024-06-24 DIAGNOSIS — Z992 Dependence on renal dialysis: Secondary | ICD-10-CM | POA: Diagnosis not present

## 2024-06-24 DIAGNOSIS — D509 Iron deficiency anemia, unspecified: Secondary | ICD-10-CM | POA: Diagnosis not present

## 2024-06-24 DIAGNOSIS — N186 End stage renal disease: Secondary | ICD-10-CM | POA: Diagnosis not present

## 2024-06-28 DIAGNOSIS — R197 Diarrhea, unspecified: Secondary | ICD-10-CM | POA: Diagnosis not present

## 2024-06-28 DIAGNOSIS — E039 Hypothyroidism, unspecified: Secondary | ICD-10-CM | POA: Diagnosis not present

## 2024-06-28 DIAGNOSIS — Z0000 Encounter for general adult medical examination without abnormal findings: Secondary | ICD-10-CM | POA: Diagnosis not present

## 2024-06-28 DIAGNOSIS — Z992 Dependence on renal dialysis: Secondary | ICD-10-CM | POA: Diagnosis not present

## 2024-06-28 DIAGNOSIS — D631 Anemia in chronic kidney disease: Secondary | ICD-10-CM | POA: Diagnosis not present

## 2024-06-28 DIAGNOSIS — N186 End stage renal disease: Secondary | ICD-10-CM | POA: Diagnosis not present

## 2024-06-28 DIAGNOSIS — R21 Rash and other nonspecific skin eruption: Secondary | ICD-10-CM | POA: Diagnosis not present

## 2024-06-28 DIAGNOSIS — I5022 Chronic systolic (congestive) heart failure: Secondary | ICD-10-CM | POA: Diagnosis not present

## 2024-06-28 DIAGNOSIS — C799 Secondary malignant neoplasm of unspecified site: Secondary | ICD-10-CM | POA: Diagnosis not present

## 2024-06-28 DIAGNOSIS — I483 Typical atrial flutter: Secondary | ICD-10-CM | POA: Diagnosis not present

## 2024-06-28 DIAGNOSIS — L989 Disorder of the skin and subcutaneous tissue, unspecified: Secondary | ICD-10-CM | POA: Diagnosis not present

## 2024-06-28 DIAGNOSIS — K863 Pseudocyst of pancreas: Secondary | ICD-10-CM | POA: Diagnosis not present

## 2024-06-29 DIAGNOSIS — Z992 Dependence on renal dialysis: Secondary | ICD-10-CM | POA: Diagnosis not present

## 2024-06-29 DIAGNOSIS — N186 End stage renal disease: Secondary | ICD-10-CM | POA: Diagnosis not present

## 2024-06-29 DIAGNOSIS — D631 Anemia in chronic kidney disease: Secondary | ICD-10-CM | POA: Diagnosis not present

## 2024-06-29 DIAGNOSIS — N2581 Secondary hyperparathyroidism of renal origin: Secondary | ICD-10-CM | POA: Diagnosis not present

## 2024-06-29 DIAGNOSIS — D509 Iron deficiency anemia, unspecified: Secondary | ICD-10-CM | POA: Diagnosis not present

## 2024-07-01 DIAGNOSIS — N2581 Secondary hyperparathyroidism of renal origin: Secondary | ICD-10-CM | POA: Diagnosis not present

## 2024-07-01 DIAGNOSIS — D509 Iron deficiency anemia, unspecified: Secondary | ICD-10-CM | POA: Diagnosis not present

## 2024-07-01 DIAGNOSIS — D631 Anemia in chronic kidney disease: Secondary | ICD-10-CM | POA: Diagnosis not present

## 2024-07-01 DIAGNOSIS — N186 End stage renal disease: Secondary | ICD-10-CM | POA: Diagnosis not present

## 2024-07-01 DIAGNOSIS — Z992 Dependence on renal dialysis: Secondary | ICD-10-CM | POA: Diagnosis not present

## 2024-07-06 DIAGNOSIS — D631 Anemia in chronic kidney disease: Secondary | ICD-10-CM | POA: Diagnosis not present

## 2024-07-06 DIAGNOSIS — N186 End stage renal disease: Secondary | ICD-10-CM | POA: Diagnosis not present

## 2024-07-06 DIAGNOSIS — D509 Iron deficiency anemia, unspecified: Secondary | ICD-10-CM | POA: Diagnosis not present

## 2024-07-06 DIAGNOSIS — N2581 Secondary hyperparathyroidism of renal origin: Secondary | ICD-10-CM | POA: Diagnosis not present

## 2024-07-06 DIAGNOSIS — Z992 Dependence on renal dialysis: Secondary | ICD-10-CM | POA: Diagnosis not present

## 2024-07-08 DIAGNOSIS — N2581 Secondary hyperparathyroidism of renal origin: Secondary | ICD-10-CM | POA: Diagnosis not present

## 2024-07-08 DIAGNOSIS — Z992 Dependence on renal dialysis: Secondary | ICD-10-CM | POA: Diagnosis not present

## 2024-07-08 DIAGNOSIS — D509 Iron deficiency anemia, unspecified: Secondary | ICD-10-CM | POA: Diagnosis not present

## 2024-07-08 DIAGNOSIS — N186 End stage renal disease: Secondary | ICD-10-CM | POA: Diagnosis not present

## 2024-07-08 DIAGNOSIS — D631 Anemia in chronic kidney disease: Secondary | ICD-10-CM | POA: Diagnosis not present

## 2024-07-13 DIAGNOSIS — N2581 Secondary hyperparathyroidism of renal origin: Secondary | ICD-10-CM | POA: Diagnosis not present

## 2024-07-13 DIAGNOSIS — N186 End stage renal disease: Secondary | ICD-10-CM | POA: Diagnosis not present

## 2024-07-13 DIAGNOSIS — I700 Atherosclerosis of aorta: Secondary | ICD-10-CM | POA: Diagnosis not present

## 2024-07-13 DIAGNOSIS — D631 Anemia in chronic kidney disease: Secondary | ICD-10-CM | POA: Diagnosis not present

## 2024-07-13 DIAGNOSIS — D509 Iron deficiency anemia, unspecified: Secondary | ICD-10-CM | POA: Diagnosis not present

## 2024-07-13 DIAGNOSIS — Z992 Dependence on renal dialysis: Secondary | ICD-10-CM | POA: Diagnosis not present

## 2024-07-13 DIAGNOSIS — I483 Typical atrial flutter: Secondary | ICD-10-CM | POA: Diagnosis not present

## 2024-07-13 DIAGNOSIS — I5022 Chronic systolic (congestive) heart failure: Secondary | ICD-10-CM | POA: Diagnosis not present

## 2024-07-14 DIAGNOSIS — Z992 Dependence on renal dialysis: Secondary | ICD-10-CM | POA: Diagnosis not present

## 2024-07-14 DIAGNOSIS — N186 End stage renal disease: Secondary | ICD-10-CM | POA: Diagnosis not present

## 2024-07-14 DIAGNOSIS — I129 Hypertensive chronic kidney disease with stage 1 through stage 4 chronic kidney disease, or unspecified chronic kidney disease: Secondary | ICD-10-CM | POA: Diagnosis not present

## 2024-07-15 DIAGNOSIS — I5022 Chronic systolic (congestive) heart failure: Secondary | ICD-10-CM | POA: Diagnosis not present

## 2024-07-15 DIAGNOSIS — I483 Typical atrial flutter: Secondary | ICD-10-CM | POA: Diagnosis not present

## 2024-07-15 DIAGNOSIS — N186 End stage renal disease: Secondary | ICD-10-CM | POA: Diagnosis not present

## 2024-07-15 DIAGNOSIS — I700 Atherosclerosis of aorta: Secondary | ICD-10-CM | POA: Diagnosis not present

## 2024-07-21 DIAGNOSIS — D0471 Carcinoma in situ of skin of right lower limb, including hip: Secondary | ICD-10-CM | POA: Diagnosis not present

## 2024-07-21 DIAGNOSIS — C44719 Basal cell carcinoma of skin of left lower limb, including hip: Secondary | ICD-10-CM | POA: Diagnosis not present

## 2024-07-21 DIAGNOSIS — H43813 Vitreous degeneration, bilateral: Secondary | ICD-10-CM | POA: Diagnosis not present

## 2024-07-21 DIAGNOSIS — Z85828 Personal history of other malignant neoplasm of skin: Secondary | ICD-10-CM | POA: Diagnosis not present

## 2024-07-21 DIAGNOSIS — H04523 Eversion of bilateral lacrimal punctum: Secondary | ICD-10-CM | POA: Diagnosis not present

## 2024-07-21 DIAGNOSIS — D0472 Carcinoma in situ of skin of left lower limb, including hip: Secondary | ICD-10-CM | POA: Diagnosis not present

## 2024-07-21 DIAGNOSIS — D3131 Benign neoplasm of right choroid: Secondary | ICD-10-CM | POA: Diagnosis not present

## 2024-07-21 DIAGNOSIS — H1045 Other chronic allergic conjunctivitis: Secondary | ICD-10-CM | POA: Diagnosis not present

## 2024-07-21 DIAGNOSIS — H0102B Squamous blepharitis left eye, upper and lower eyelids: Secondary | ICD-10-CM | POA: Diagnosis not present

## 2024-07-21 DIAGNOSIS — H5021 Vertical strabismus, right eye: Secondary | ICD-10-CM | POA: Diagnosis not present

## 2024-07-21 DIAGNOSIS — Z85820 Personal history of malignant melanoma of skin: Secondary | ICD-10-CM | POA: Diagnosis not present

## 2024-07-21 DIAGNOSIS — H0102A Squamous blepharitis right eye, upper and lower eyelids: Secondary | ICD-10-CM | POA: Diagnosis not present

## 2024-07-21 DIAGNOSIS — D485 Neoplasm of uncertain behavior of skin: Secondary | ICD-10-CM | POA: Diagnosis not present

## 2024-07-21 DIAGNOSIS — H04123 Dry eye syndrome of bilateral lacrimal glands: Secondary | ICD-10-CM | POA: Diagnosis not present

## 2024-07-21 DIAGNOSIS — H401132 Primary open-angle glaucoma, bilateral, moderate stage: Secondary | ICD-10-CM | POA: Diagnosis not present

## 2024-08-04 ENCOUNTER — Other Ambulatory Visit (HOSPITAL_BASED_OUTPATIENT_CLINIC_OR_DEPARTMENT_OTHER): Payer: Self-pay

## 2024-08-04 DIAGNOSIS — Z23 Encounter for immunization: Secondary | ICD-10-CM | POA: Diagnosis not present

## 2024-08-04 MED ORDER — COMIRNATY 30 MCG/0.3ML IM SUSY
0.3000 mL | PREFILLED_SYRINGE | Freq: Once | INTRAMUSCULAR | 0 refills | Status: AC
  Filled 2024-08-04: qty 0.3, 1d supply, fill #0

## 2024-08-13 DIAGNOSIS — I700 Atherosclerosis of aorta: Secondary | ICD-10-CM | POA: Diagnosis not present

## 2024-08-13 DIAGNOSIS — I5022 Chronic systolic (congestive) heart failure: Secondary | ICD-10-CM | POA: Diagnosis not present

## 2024-08-13 DIAGNOSIS — I483 Typical atrial flutter: Secondary | ICD-10-CM | POA: Diagnosis not present

## 2024-08-13 DIAGNOSIS — N186 End stage renal disease: Secondary | ICD-10-CM | POA: Diagnosis not present

## 2024-08-14 DIAGNOSIS — N186 End stage renal disease: Secondary | ICD-10-CM | POA: Diagnosis not present

## 2024-08-14 DIAGNOSIS — I129 Hypertensive chronic kidney disease with stage 1 through stage 4 chronic kidney disease, or unspecified chronic kidney disease: Secondary | ICD-10-CM | POA: Diagnosis not present

## 2024-08-14 DIAGNOSIS — I5022 Chronic systolic (congestive) heart failure: Secondary | ICD-10-CM | POA: Diagnosis not present

## 2024-08-14 DIAGNOSIS — I483 Typical atrial flutter: Secondary | ICD-10-CM | POA: Diagnosis not present

## 2024-08-14 DIAGNOSIS — Z992 Dependence on renal dialysis: Secondary | ICD-10-CM | POA: Diagnosis not present

## 2024-08-14 DIAGNOSIS — I700 Atherosclerosis of aorta: Secondary | ICD-10-CM | POA: Diagnosis not present

## 2024-08-17 DIAGNOSIS — D631 Anemia in chronic kidney disease: Secondary | ICD-10-CM | POA: Diagnosis not present

## 2024-08-17 DIAGNOSIS — N186 End stage renal disease: Secondary | ICD-10-CM | POA: Diagnosis not present

## 2024-08-17 DIAGNOSIS — N2581 Secondary hyperparathyroidism of renal origin: Secondary | ICD-10-CM | POA: Diagnosis not present

## 2024-08-17 DIAGNOSIS — Z992 Dependence on renal dialysis: Secondary | ICD-10-CM | POA: Diagnosis not present

## 2024-08-19 DIAGNOSIS — D631 Anemia in chronic kidney disease: Secondary | ICD-10-CM | POA: Diagnosis not present

## 2024-08-19 DIAGNOSIS — Z992 Dependence on renal dialysis: Secondary | ICD-10-CM | POA: Diagnosis not present

## 2024-08-19 DIAGNOSIS — N2581 Secondary hyperparathyroidism of renal origin: Secondary | ICD-10-CM | POA: Diagnosis not present

## 2024-08-19 DIAGNOSIS — N186 End stage renal disease: Secondary | ICD-10-CM | POA: Diagnosis not present

## 2024-08-20 DIAGNOSIS — D492 Neoplasm of unspecified behavior of bone, soft tissue, and skin: Secondary | ICD-10-CM | POA: Diagnosis not present

## 2024-08-20 DIAGNOSIS — Z961 Presence of intraocular lens: Secondary | ICD-10-CM | POA: Diagnosis not present

## 2024-08-24 DIAGNOSIS — D631 Anemia in chronic kidney disease: Secondary | ICD-10-CM | POA: Diagnosis not present

## 2024-08-24 DIAGNOSIS — N186 End stage renal disease: Secondary | ICD-10-CM | POA: Diagnosis not present

## 2024-08-24 DIAGNOSIS — N2581 Secondary hyperparathyroidism of renal origin: Secondary | ICD-10-CM | POA: Diagnosis not present

## 2024-08-24 DIAGNOSIS — Z992 Dependence on renal dialysis: Secondary | ICD-10-CM | POA: Diagnosis not present

## 2024-08-26 DIAGNOSIS — Z992 Dependence on renal dialysis: Secondary | ICD-10-CM | POA: Diagnosis not present

## 2024-08-26 DIAGNOSIS — N186 End stage renal disease: Secondary | ICD-10-CM | POA: Diagnosis not present

## 2024-08-26 DIAGNOSIS — N2581 Secondary hyperparathyroidism of renal origin: Secondary | ICD-10-CM | POA: Diagnosis not present

## 2024-08-26 DIAGNOSIS — D631 Anemia in chronic kidney disease: Secondary | ICD-10-CM | POA: Diagnosis not present

## 2024-08-30 DIAGNOSIS — D492 Neoplasm of unspecified behavior of bone, soft tissue, and skin: Secondary | ICD-10-CM | POA: Diagnosis not present

## 2024-08-31 DIAGNOSIS — N186 End stage renal disease: Secondary | ICD-10-CM | POA: Diagnosis not present

## 2024-08-31 DIAGNOSIS — N2581 Secondary hyperparathyroidism of renal origin: Secondary | ICD-10-CM | POA: Diagnosis not present

## 2024-08-31 DIAGNOSIS — D631 Anemia in chronic kidney disease: Secondary | ICD-10-CM | POA: Diagnosis not present

## 2024-08-31 DIAGNOSIS — Z992 Dependence on renal dialysis: Secondary | ICD-10-CM | POA: Diagnosis not present

## 2024-09-01 DIAGNOSIS — Z992 Dependence on renal dialysis: Secondary | ICD-10-CM | POA: Diagnosis not present

## 2024-09-01 DIAGNOSIS — Z8719 Personal history of other diseases of the digestive system: Secondary | ICD-10-CM | POA: Diagnosis not present

## 2024-09-01 DIAGNOSIS — C434 Malignant melanoma of scalp and neck: Secondary | ICD-10-CM | POA: Diagnosis not present

## 2024-09-01 DIAGNOSIS — C439 Malignant melanoma of skin, unspecified: Secondary | ICD-10-CM | POA: Diagnosis not present

## 2024-09-01 DIAGNOSIS — Z9289 Personal history of other medical treatment: Secondary | ICD-10-CM | POA: Diagnosis not present

## 2024-09-02 DIAGNOSIS — N2581 Secondary hyperparathyroidism of renal origin: Secondary | ICD-10-CM | POA: Diagnosis not present

## 2024-09-02 DIAGNOSIS — D631 Anemia in chronic kidney disease: Secondary | ICD-10-CM | POA: Diagnosis not present

## 2024-09-02 DIAGNOSIS — Z992 Dependence on renal dialysis: Secondary | ICD-10-CM | POA: Diagnosis not present

## 2024-09-02 DIAGNOSIS — N186 End stage renal disease: Secondary | ICD-10-CM | POA: Diagnosis not present

## 2024-09-06 DIAGNOSIS — N186 End stage renal disease: Secondary | ICD-10-CM | POA: Diagnosis not present

## 2024-09-06 DIAGNOSIS — Z992 Dependence on renal dialysis: Secondary | ICD-10-CM | POA: Diagnosis not present

## 2024-09-06 DIAGNOSIS — D631 Anemia in chronic kidney disease: Secondary | ICD-10-CM | POA: Diagnosis not present

## 2024-09-06 DIAGNOSIS — N2581 Secondary hyperparathyroidism of renal origin: Secondary | ICD-10-CM | POA: Diagnosis not present

## 2024-09-12 DIAGNOSIS — I5022 Chronic systolic (congestive) heart failure: Secondary | ICD-10-CM | POA: Diagnosis not present

## 2024-09-12 DIAGNOSIS — N186 End stage renal disease: Secondary | ICD-10-CM | POA: Diagnosis not present

## 2024-09-12 DIAGNOSIS — I700 Atherosclerosis of aorta: Secondary | ICD-10-CM | POA: Diagnosis not present

## 2024-09-12 DIAGNOSIS — I483 Typical atrial flutter: Secondary | ICD-10-CM | POA: Diagnosis not present

## 2024-09-20 DIAGNOSIS — K8689 Other specified diseases of pancreas: Secondary | ICD-10-CM | POA: Diagnosis not present

## 2024-09-20 DIAGNOSIS — I5022 Chronic systolic (congestive) heart failure: Secondary | ICD-10-CM | POA: Diagnosis not present

## 2024-09-20 DIAGNOSIS — K921 Melena: Secondary | ICD-10-CM | POA: Diagnosis not present

## 2024-09-20 DIAGNOSIS — D631 Anemia in chronic kidney disease: Secondary | ICD-10-CM | POA: Diagnosis not present

## 2024-09-20 DIAGNOSIS — C799 Secondary malignant neoplasm of unspecified site: Secondary | ICD-10-CM | POA: Diagnosis not present

## 2024-09-20 DIAGNOSIS — Z992 Dependence on renal dialysis: Secondary | ICD-10-CM | POA: Diagnosis not present

## 2024-09-20 DIAGNOSIS — N186 End stage renal disease: Secondary | ICD-10-CM | POA: Diagnosis not present

## 2024-09-20 DIAGNOSIS — K863 Pseudocyst of pancreas: Secondary | ICD-10-CM | POA: Diagnosis not present

## 2024-09-22 DIAGNOSIS — D692 Other nonthrombocytopenic purpura: Secondary | ICD-10-CM | POA: Diagnosis not present

## 2024-09-22 DIAGNOSIS — Z85820 Personal history of malignant melanoma of skin: Secondary | ICD-10-CM | POA: Diagnosis not present

## 2024-09-22 DIAGNOSIS — L821 Other seborrheic keratosis: Secondary | ICD-10-CM | POA: Diagnosis not present

## 2024-09-22 DIAGNOSIS — D1801 Hemangioma of skin and subcutaneous tissue: Secondary | ICD-10-CM | POA: Diagnosis not present

## 2024-09-22 DIAGNOSIS — Z85828 Personal history of other malignant neoplasm of skin: Secondary | ICD-10-CM | POA: Diagnosis not present

## 2024-09-22 DIAGNOSIS — C4442 Squamous cell carcinoma of skin of scalp and neck: Secondary | ICD-10-CM | POA: Diagnosis not present

## 2024-09-22 DIAGNOSIS — C44329 Squamous cell carcinoma of skin of other parts of face: Secondary | ICD-10-CM | POA: Diagnosis not present

## 2024-09-22 DIAGNOSIS — D485 Neoplasm of uncertain behavior of skin: Secondary | ICD-10-CM | POA: Diagnosis not present
